# Patient Record
Sex: Female | Born: 1940
Health system: Southern US, Community
[De-identification: ages and names within clinical notes are randomized; demographics above are authoritative.]

## PROBLEM LIST (undated history)

## (undated) DIAGNOSIS — C801 Malignant (primary) neoplasm, unspecified: Secondary | ICD-10-CM

## (undated) DIAGNOSIS — R946 Abnormal results of thyroid function studies: Secondary | ICD-10-CM

## (undated) DIAGNOSIS — I10 Essential (primary) hypertension: Secondary | ICD-10-CM

## (undated) DIAGNOSIS — E785 Hyperlipidemia, unspecified: Secondary | ICD-10-CM

## (undated) DIAGNOSIS — E669 Obesity, unspecified: Secondary | ICD-10-CM

## (undated) DIAGNOSIS — F419 Anxiety disorder, unspecified: Secondary | ICD-10-CM

## (undated) DIAGNOSIS — L309 Dermatitis, unspecified: Secondary | ICD-10-CM

## (undated) DIAGNOSIS — I2510 Atherosclerotic heart disease of native coronary artery without angina pectoris: Secondary | ICD-10-CM

## (undated) DIAGNOSIS — Z853 Personal history of malignant neoplasm of breast: Secondary | ICD-10-CM

## (undated) DIAGNOSIS — E039 Hypothyroidism, unspecified: Secondary | ICD-10-CM

## (undated) DIAGNOSIS — M1990 Unspecified osteoarthritis, unspecified site: Secondary | ICD-10-CM

## (undated) DIAGNOSIS — H409 Unspecified glaucoma: Secondary | ICD-10-CM

## (undated) HISTORY — PX: MASTECTOMY: SHX3

## (undated) HISTORY — DX: Unspecified glaucoma: H40.9

## (undated) HISTORY — DX: Malignant (primary) neoplasm, unspecified: C80.1

## (undated) HISTORY — DX: Hyperlipidemia, unspecified: E78.5

## (undated) HISTORY — DX: Dermatitis, unspecified: L30.9

## (undated) HISTORY — DX: Unspecified osteoarthritis, unspecified site: M19.90

## (undated) HISTORY — PX: CRYOTHERAPY: SHX1416

## (undated) HISTORY — PX: EXCISION MORTON'S NEUROMA: SHX5013

## (undated) HISTORY — PX: REDUCTION MAMMAPLASTY: SUR839

## (undated) HISTORY — PX: DILATION AND CURETTAGE OF UTERUS: SHX78

## (undated) HISTORY — DX: Abnormal results of thyroid function studies: R94.6

## (undated) HISTORY — DX: Personal history of malignant neoplasm of breast: Z85.3

## (undated) HISTORY — DX: Essential (primary) hypertension: I10

## (undated) HISTORY — DX: Obesity, unspecified: E66.9

## (undated) HISTORY — PX: CARDIAC CATHETERIZATION: SHX172

## (undated) HISTORY — PX: COLONOSCOPY: SHX174

## (undated) HISTORY — DX: Anxiety disorder, unspecified: F41.9

## (undated) HISTORY — PX: AUGMENTATION MAMMAPLASTY: SUR837

---

## 2005-08-23 ENCOUNTER — Encounter: Admission: RE | Admit: 2005-08-23 | Discharge: 2005-08-23 | Payer: Self-pay | Admitting: Surgery

## 2005-08-30 ENCOUNTER — Encounter (INDEPENDENT_AMBULATORY_CARE_PROVIDER_SITE_OTHER): Payer: Self-pay | Admitting: *Deleted

## 2005-08-30 ENCOUNTER — Encounter (INDEPENDENT_AMBULATORY_CARE_PROVIDER_SITE_OTHER): Payer: Self-pay | Admitting: Diagnostic Radiology

## 2005-08-30 ENCOUNTER — Encounter: Admission: RE | Admit: 2005-08-30 | Discharge: 2005-08-30 | Payer: Self-pay | Admitting: Surgery

## 2005-10-11 HISTORY — PX: BREAST SURGERY: SHX581

## 2005-10-15 ENCOUNTER — Encounter: Admission: RE | Admit: 2005-10-15 | Discharge: 2005-10-15 | Payer: Self-pay | Admitting: Surgery

## 2005-10-19 ENCOUNTER — Encounter (INDEPENDENT_AMBULATORY_CARE_PROVIDER_SITE_OTHER): Payer: Self-pay | Admitting: Surgery

## 2005-10-19 ENCOUNTER — Observation Stay (HOSPITAL_COMMUNITY): Admission: AD | Admit: 2005-10-19 | Discharge: 2005-10-20 | Payer: Self-pay | Admitting: Surgery

## 2005-10-19 ENCOUNTER — Encounter: Payer: Self-pay | Admitting: Surgery

## 2005-10-19 ENCOUNTER — Encounter (INDEPENDENT_AMBULATORY_CARE_PROVIDER_SITE_OTHER): Payer: Self-pay | Admitting: *Deleted

## 2005-11-07 ENCOUNTER — Ambulatory Visit: Payer: Self-pay | Admitting: Oncology

## 2006-01-11 ENCOUNTER — Ambulatory Visit: Payer: Self-pay | Admitting: Oncology

## 2006-01-15 LAB — CBC WITH DIFFERENTIAL/PLATELET
BASO%: 0.6 % (ref 0.0–2.0)
Basophils Absolute: 0 10*3/uL (ref 0.0–0.1)
EOS%: 2.1 % (ref 0.0–7.0)
Eosinophils Absolute: 0.1 10*3/uL (ref 0.0–0.5)
HCT: 40.2 % (ref 34.8–46.6)
HGB: 13.3 g/dL (ref 11.6–15.9)
LYMPH%: 26.9 % (ref 14.0–48.0)
MCH: 29.1 pg (ref 26.0–34.0)
MCHC: 33.1 g/dL (ref 32.0–36.0)
MCV: 87.8 fL (ref 81.0–101.0)
MONO#: 0.6 10*3/uL (ref 0.1–0.9)
MONO%: 8.8 % (ref 0.0–13.0)
NEUT#: 4.2 10*3/uL (ref 1.5–6.5)
NEUT%: 61.6 % (ref 39.6–76.8)
Platelets: 310 10*3/uL (ref 145–400)
RBC: 4.57 10*6/uL (ref 3.70–5.32)
RDW: 13 % (ref 11.3–14.5)
WBC: 6.7 10*3/uL (ref 3.9–10.0)
lymph#: 1.8 10*3/uL (ref 0.9–3.3)

## 2006-01-15 LAB — COMPREHENSIVE METABOLIC PANEL
ALT: 25 U/L (ref 0–40)
AST: 21 U/L (ref 0–37)
Albumin: 4.6 g/dL (ref 3.5–5.2)
Alkaline Phosphatase: 69 U/L (ref 39–117)
BUN: 14 mg/dL (ref 6–23)
CO2: 27 mEq/L (ref 19–32)
Calcium: 9.4 mg/dL (ref 8.4–10.5)
Chloride: 106 mEq/L (ref 96–112)
Creatinine, Ser: 0.7 mg/dL (ref 0.4–1.2)
Glucose, Bld: 85 mg/dL (ref 70–99)
Potassium: 4.1 mEq/L (ref 3.5–5.3)
Sodium: 143 mEq/L (ref 135–145)
Total Bilirubin: 0.4 mg/dL (ref 0.3–1.2)
Total Protein: 7 g/dL (ref 6.0–8.3)

## 2006-01-15 LAB — CANCER ANTIGEN 27.29: CA 27.29: 26 U/mL (ref 0–39)

## 2006-01-15 LAB — LACTATE DEHYDROGENASE: LDH: 204 U/L (ref 94–250)

## 2006-04-12 ENCOUNTER — Ambulatory Visit: Payer: Self-pay | Admitting: Oncology

## 2006-04-16 LAB — CBC WITH DIFFERENTIAL/PLATELET
BASO%: 0.7 % (ref 0.0–2.0)
Basophils Absolute: 0 10*3/uL (ref 0.0–0.1)
EOS%: 3 % (ref 0.0–7.0)
Eosinophils Absolute: 0.2 10*3/uL (ref 0.0–0.5)
HCT: 39.2 % (ref 34.8–46.6)
HGB: 13.1 g/dL (ref 11.6–15.9)
LYMPH%: 29.5 % (ref 14.0–48.0)
MCH: 29 pg (ref 26.0–34.0)
MCHC: 33.4 g/dL (ref 32.0–36.0)
MCV: 86.9 fL (ref 81.0–101.0)
MONO#: 0.5 10*3/uL (ref 0.1–0.9)
MONO%: 9.4 % (ref 0.0–13.0)
NEUT#: 2.9 10*3/uL (ref 1.5–6.5)
NEUT%: 57.4 % (ref 39.6–76.8)
Platelets: 275 10*3/uL (ref 145–400)
RBC: 4.51 10*6/uL (ref 3.70–5.32)
RDW: 13.1 % (ref 11.3–14.5)
WBC: 5.1 10*3/uL (ref 3.9–10.0)
lymph#: 1.5 10*3/uL (ref 0.9–3.3)

## 2006-05-21 ENCOUNTER — Encounter: Admission: RE | Admit: 2006-05-21 | Discharge: 2006-05-21 | Payer: Self-pay | Admitting: Plastic Surgery

## 2006-07-12 ENCOUNTER — Ambulatory Visit: Payer: Self-pay | Admitting: Oncology

## 2006-07-16 LAB — CBC WITH DIFFERENTIAL/PLATELET
BASO%: 0.9 % (ref 0.0–2.0)
Basophils Absolute: 0.1 10*3/uL (ref 0.0–0.1)
EOS%: 0.7 % (ref 0.0–7.0)
Eosinophils Absolute: 0.1 10*3/uL (ref 0.0–0.5)
HCT: 40.3 % (ref 34.8–46.6)
HGB: 13.6 g/dL (ref 11.6–15.9)
LYMPH%: 18.5 % (ref 14.0–48.0)
MCH: 28.8 pg (ref 26.0–34.0)
MCHC: 33.7 g/dL (ref 32.0–36.0)
MCV: 85.6 fL (ref 81.0–101.0)
MONO#: 0.6 10*3/uL (ref 0.1–0.9)
MONO%: 6.5 % (ref 0.0–13.0)
NEUT#: 6.9 10*3/uL — ABNORMAL HIGH (ref 1.5–6.5)
NEUT%: 73.3 % (ref 39.6–76.8)
Platelets: 293 10*3/uL (ref 145–400)
RBC: 4.71 10*6/uL (ref 3.70–5.32)
RDW: 11.9 % (ref 11.3–14.5)
WBC: 9.4 10*3/uL (ref 3.9–10.0)
lymph#: 1.7 10*3/uL (ref 0.9–3.3)

## 2006-07-30 LAB — COMPREHENSIVE METABOLIC PANEL
ALT: 24 U/L (ref 0–35)
AST: 19 U/L (ref 0–37)
Albumin: 4.6 g/dL (ref 3.5–5.2)
Alkaline Phosphatase: 76 U/L (ref 39–117)
BUN: 13 mg/dL (ref 6–23)
CO2: 28 mEq/L (ref 19–32)
Calcium: 8.9 mg/dL (ref 8.4–10.5)
Chloride: 104 mEq/L (ref 96–112)
Creatinine, Ser: 0.71 mg/dL (ref 0.40–1.20)
Glucose, Bld: 129 mg/dL — ABNORMAL HIGH (ref 70–99)
Potassium: 3.6 mEq/L (ref 3.5–5.3)
Sodium: 139 mEq/L (ref 135–145)
Total Bilirubin: 0.7 mg/dL (ref 0.3–1.2)
Total Protein: 7 g/dL (ref 6.0–8.3)

## 2006-07-30 LAB — ESTRADIOL, ULTRA SENS: Estradiol, Ultra Sensitive: 28 pg/mL

## 2006-07-30 LAB — CANCER ANTIGEN 27.29: CA 27.29: 28 U/mL (ref 0–39)

## 2006-10-16 ENCOUNTER — Ambulatory Visit: Payer: Self-pay | Admitting: Oncology

## 2006-10-21 LAB — CBC WITH DIFFERENTIAL/PLATELET
BASO%: 0.4 % (ref 0.0–2.0)
Basophils Absolute: 0 10*3/uL (ref 0.0–0.1)
EOS%: 1.9 % (ref 0.0–7.0)
Eosinophils Absolute: 0.1 10*3/uL (ref 0.0–0.5)
HCT: 40 % (ref 34.8–46.6)
HGB: 13.7 g/dL (ref 11.6–15.9)
LYMPH%: 13.5 % — ABNORMAL LOW (ref 14.0–48.0)
MCH: 29.4 pg (ref 26.0–34.0)
MCHC: 34.2 g/dL (ref 32.0–36.0)
MCV: 85.9 fL (ref 81.0–101.0)
MONO#: 0.6 10*3/uL (ref 0.1–0.9)
MONO%: 9.2 % (ref 0.0–13.0)
NEUT#: 5.3 10*3/uL (ref 1.5–6.5)
NEUT%: 75 % (ref 39.6–76.8)
Platelets: 243 10*3/uL (ref 145–400)
RBC: 4.65 10*6/uL (ref 3.70–5.32)
RDW: 12.8 % (ref 11.3–14.5)
WBC: 7 10*3/uL (ref 3.9–10.0)
lymph#: 0.9 10*3/uL (ref 0.9–3.3)

## 2006-10-22 ENCOUNTER — Ambulatory Visit (HOSPITAL_BASED_OUTPATIENT_CLINIC_OR_DEPARTMENT_OTHER): Admission: RE | Admit: 2006-10-22 | Discharge: 2006-10-22 | Payer: Self-pay | Admitting: Plastic Surgery

## 2006-10-22 ENCOUNTER — Encounter (INDEPENDENT_AMBULATORY_CARE_PROVIDER_SITE_OTHER): Payer: Self-pay | Admitting: Specialist

## 2006-10-22 LAB — COMPREHENSIVE METABOLIC PANEL
ALT: 14 U/L (ref 0–35)
AST: 14 U/L (ref 0–37)
Albumin: 4.5 g/dL (ref 3.5–5.2)
Alkaline Phosphatase: 81 U/L (ref 39–117)
BUN: 16 mg/dL (ref 6–23)
CO2: 25 mEq/L (ref 19–32)
Calcium: 9.1 mg/dL (ref 8.4–10.5)
Chloride: 108 mEq/L (ref 96–112)
Creatinine, Ser: 0.77 mg/dL (ref 0.40–1.20)
Glucose, Bld: 90 mg/dL (ref 70–99)
Potassium: 4.3 mEq/L (ref 3.5–5.3)
Sodium: 142 mEq/L (ref 135–145)
Total Bilirubin: 0.5 mg/dL (ref 0.3–1.2)
Total Protein: 6.8 g/dL (ref 6.0–8.3)

## 2006-10-22 LAB — CANCER ANTIGEN 27.29: CA 27.29: 28 U/mL (ref 0–39)

## 2007-05-08 ENCOUNTER — Ambulatory Visit: Payer: Self-pay | Admitting: Oncology

## 2007-05-13 LAB — CBC WITH DIFFERENTIAL/PLATELET
BASO%: 2.6 % — ABNORMAL HIGH (ref 0.0–2.0)
Basophils Absolute: 0.2 10*3/uL — ABNORMAL HIGH (ref 0.0–0.1)
EOS%: 0.3 % (ref 0.0–7.0)
Eosinophils Absolute: 0 10*3/uL (ref 0.0–0.5)
HCT: 40.7 % (ref 34.8–46.6)
HGB: 14.2 g/dL (ref 11.6–15.9)
LYMPH%: 22.7 % (ref 14.0–48.0)
MCH: 30.3 pg (ref 26.0–34.0)
MCHC: 34.9 g/dL (ref 32.0–36.0)
MCV: 86.8 fL (ref 81.0–101.0)
MONO#: 0.7 10*3/uL (ref 0.1–0.9)
MONO%: 8 % (ref 0.0–13.0)
NEUT#: 6 10*3/uL (ref 1.5–6.5)
NEUT%: 66.4 % (ref 39.6–76.8)
Platelets: 295 10*3/uL (ref 145–400)
RBC: 4.69 10*6/uL (ref 3.70–5.32)
RDW: 12.3 % (ref 11.3–14.5)
WBC: 9 10*3/uL (ref 3.9–10.0)
lymph#: 2 10*3/uL (ref 0.9–3.3)

## 2007-05-13 LAB — COMPREHENSIVE METABOLIC PANEL
ALT: 17 U/L (ref 0–35)
AST: 14 U/L (ref 0–37)
Albumin: 4.7 g/dL (ref 3.5–5.2)
Alkaline Phosphatase: 78 U/L (ref 39–117)
BUN: 18 mg/dL (ref 6–23)
CO2: 24 mEq/L (ref 19–32)
Calcium: 10 mg/dL (ref 8.4–10.5)
Chloride: 104 mEq/L (ref 96–112)
Creatinine, Ser: 0.73 mg/dL (ref 0.40–1.20)
Glucose, Bld: 86 mg/dL (ref 70–99)
Potassium: 4.4 mEq/L (ref 3.5–5.3)
Sodium: 142 mEq/L (ref 135–145)
Total Bilirubin: 0.7 mg/dL (ref 0.3–1.2)
Total Protein: 7.3 g/dL (ref 6.0–8.3)

## 2007-05-13 LAB — CANCER ANTIGEN 27.29: CA 27.29: 31 U/mL (ref 0–39)

## 2007-05-21 LAB — ESTRADIOL, ULTRA SENS: Estradiol, Ultra Sensitive: <2 pg/mL

## 2007-06-02 ENCOUNTER — Encounter: Admission: RE | Admit: 2007-06-02 | Discharge: 2007-06-02 | Payer: Self-pay | Admitting: Surgery

## 2007-07-14 ENCOUNTER — Ambulatory Visit: Payer: Self-pay | Admitting: Family Medicine

## 2007-09-15 ENCOUNTER — Ambulatory Visit: Payer: Self-pay | Admitting: Family Medicine

## 2007-11-06 ENCOUNTER — Ambulatory Visit: Payer: Self-pay | Admitting: Oncology

## 2007-11-13 LAB — CBC WITH DIFFERENTIAL/PLATELET
BASO%: 0.4 % (ref 0.0–2.0)
Basophils Absolute: 0 10*3/uL (ref 0.0–0.1)
EOS%: 0.9 % (ref 0.0–7.0)
Eosinophils Absolute: 0.1 10*3/uL (ref 0.0–0.5)
HCT: 39 % (ref 34.8–46.6)
HGB: 13.3 g/dL (ref 11.6–15.9)
LYMPH%: 32.5 % (ref 14.0–48.0)
MCH: 29.5 pg (ref 26.0–34.0)
MCHC: 34.2 g/dL (ref 32.0–36.0)
MCV: 86.1 fL (ref 81.0–101.0)
MONO#: 0.5 10*3/uL (ref 0.1–0.9)
MONO%: 8 % (ref 0.0–13.0)
NEUT#: 3.4 10*3/uL (ref 1.5–6.5)
NEUT%: 58.2 % (ref 39.6–76.8)
Platelets: 227 10*3/uL (ref 145–400)
RBC: 4.53 10*6/uL (ref 3.70–5.32)
RDW: 12.6 % (ref 11.3–14.5)
WBC: 5.8 10*3/uL (ref 3.9–10.0)
lymph#: 1.9 10*3/uL (ref 0.9–3.3)

## 2007-11-14 LAB — COMPREHENSIVE METABOLIC PANEL
ALT: 14 U/L (ref 0–35)
AST: 14 U/L (ref 0–37)
Albumin: 4.4 g/dL (ref 3.5–5.2)
Alkaline Phosphatase: 64 U/L (ref 39–117)
BUN: 15 mg/dL (ref 6–23)
CO2: 25 mEq/L (ref 19–32)
Calcium: 9.1 mg/dL (ref 8.4–10.5)
Chloride: 106 mEq/L (ref 96–112)
Creatinine, Ser: 0.64 mg/dL (ref 0.40–1.20)
Glucose, Bld: 95 mg/dL (ref 70–99)
Potassium: 4.1 mEq/L (ref 3.5–5.3)
Sodium: 143 mEq/L (ref 135–145)
Total Bilirubin: 0.6 mg/dL (ref 0.3–1.2)
Total Protein: 6.7 g/dL (ref 6.0–8.3)

## 2007-11-14 LAB — VITAMIN D 25 HYDROXY (VIT D DEFICIENCY, FRACTURES): Vit D, 25-Hydroxy: 22 ng/mL — ABNORMAL LOW (ref 30–89)

## 2007-11-14 LAB — CANCER ANTIGEN 27.29: CA 27.29: 26 U/mL (ref 0–39)

## 2007-11-20 LAB — VITAMIN D 1,25 DIHYDROXY: Vit D, 1,25-Dihydroxy: 55 pg/mL (ref 6–62)

## 2008-06-11 ENCOUNTER — Ambulatory Visit: Payer: Self-pay | Admitting: Oncology

## 2008-06-14 ENCOUNTER — Encounter: Admission: RE | Admit: 2008-06-14 | Discharge: 2008-06-14 | Payer: Self-pay | Admitting: Surgery

## 2008-06-15 LAB — CBC WITH DIFFERENTIAL/PLATELET
BASO%: 1.8 % (ref 0.0–2.0)
Basophils Absolute: 0.1 10*3/uL (ref 0.0–0.1)
EOS%: 0.8 % (ref 0.0–7.0)
Eosinophils Absolute: 0.1 10*3/uL (ref 0.0–0.5)
HCT: 41.2 % (ref 34.8–46.6)
HGB: 14 g/dL (ref 11.6–15.9)
LYMPH%: 25 % (ref 14.0–48.0)
MCH: 29.5 pg (ref 26.0–34.0)
MCHC: 33.9 g/dL (ref 32.0–36.0)
MCV: 87.1 fL (ref 81.0–101.0)
MONO#: 0.6 10*3/uL (ref 0.1–0.9)
MONO%: 7.7 % (ref 0.0–13.0)
NEUT#: 5 10*3/uL (ref 1.5–6.5)
NEUT%: 64.7 % (ref 39.6–76.8)
Platelets: 295 10*3/uL (ref 145–400)
RBC: 4.73 10*6/uL (ref 3.70–5.32)
RDW: 11.8 % (ref 11.3–14.5)
WBC: 7.7 10*3/uL (ref 3.9–10.0)
lymph#: 1.9 10*3/uL (ref 0.9–3.3)

## 2008-06-16 LAB — COMPREHENSIVE METABOLIC PANEL
ALT: 26 U/L (ref 0–35)
AST: 17 U/L (ref 0–37)
Albumin: 4.8 g/dL (ref 3.5–5.2)
Alkaline Phosphatase: 75 U/L (ref 39–117)
BUN: 17 mg/dL (ref 6–23)
CO2: 27 mEq/L (ref 19–32)
Calcium: 9.7 mg/dL (ref 8.4–10.5)
Chloride: 105 mEq/L (ref 96–112)
Creatinine, Ser: 0.77 mg/dL (ref 0.40–1.20)
Glucose, Bld: 104 mg/dL — ABNORMAL HIGH (ref 70–99)
Potassium: 4 mEq/L (ref 3.5–5.3)
Sodium: 142 mEq/L (ref 135–145)
Total Bilirubin: 0.6 mg/dL (ref 0.3–1.2)
Total Protein: 7.3 g/dL (ref 6.0–8.3)

## 2008-06-16 LAB — CANCER ANTIGEN 27.29: CA 27.29: 37 U/mL (ref 0–39)

## 2008-10-18 ENCOUNTER — Ambulatory Visit: Payer: Self-pay | Admitting: Family Medicine

## 2008-12-14 ENCOUNTER — Ambulatory Visit: Payer: Self-pay | Admitting: Oncology

## 2008-12-16 LAB — CBC WITH DIFFERENTIAL/PLATELET
BASO%: 0.9 % (ref 0.0–2.0)
Basophils Absolute: 0.1 10*3/uL (ref 0.0–0.1)
EOS%: 2 % (ref 0.0–7.0)
Eosinophils Absolute: 0.1 10*3/uL (ref 0.0–0.5)
HCT: 41 % (ref 34.8–46.6)
HGB: 13.6 g/dL (ref 11.6–15.9)
LYMPH%: 27.9 % (ref 14.0–49.7)
MCH: 29.2 pg (ref 25.1–34.0)
MCHC: 33.2 g/dL (ref 31.5–36.0)
MCV: 88 fL (ref 79.5–101.0)
MONO#: 0.5 10*3/uL (ref 0.1–0.9)
MONO%: 10.1 % (ref 0.0–14.0)
NEUT#: 3.2 10*3/uL (ref 1.5–6.5)
NEUT%: 59.1 % (ref 38.4–76.8)
Platelets: ADEQUATE 10*3/uL (ref 145–400)
RBC: 4.66 10*6/uL (ref 3.70–5.45)
RDW: 12.7 % (ref 11.2–14.5)
WBC: 5.4 10*3/uL (ref 3.9–10.3)
lymph#: 1.5 10*3/uL (ref 0.9–3.3)
nRBC: 0 % (ref 0–0)

## 2008-12-17 LAB — COMPREHENSIVE METABOLIC PANEL
ALT: 26 U/L (ref 0–35)
AST: 21 U/L (ref 0–37)
Albumin: 4.6 g/dL (ref 3.5–5.2)
Alkaline Phosphatase: 69 U/L (ref 39–117)
BUN: 10 mg/dL (ref 6–23)
CO2: 27 mEq/L (ref 19–32)
Calcium: 9.8 mg/dL (ref 8.4–10.5)
Chloride: 107 mEq/L (ref 96–112)
Creatinine, Ser: 0.72 mg/dL (ref 0.40–1.20)
Glucose, Bld: 83 mg/dL (ref 70–99)
Potassium: 4.1 mEq/L (ref 3.5–5.3)
Sodium: 143 mEq/L (ref 135–145)
Total Bilirubin: 0.5 mg/dL (ref 0.3–1.2)
Total Protein: 6.7 g/dL (ref 6.0–8.3)

## 2008-12-17 LAB — CANCER ANTIGEN 27.29: CA 27.29: 36 U/mL (ref 0–39)

## 2008-12-17 LAB — VITAMIN D 25 HYDROXY (VIT D DEFICIENCY, FRACTURES): Vit D, 25-Hydroxy: 13 ng/mL — ABNORMAL LOW (ref 30–89)

## 2009-06-15 ENCOUNTER — Ambulatory Visit: Payer: Self-pay | Admitting: Family Medicine

## 2009-06-15 ENCOUNTER — Encounter: Admission: RE | Admit: 2009-06-15 | Discharge: 2009-06-15 | Payer: Self-pay | Admitting: Surgery

## 2009-06-15 LAB — HM MAMMOGRAPHY: HM Mammogram: NORMAL

## 2009-07-13 ENCOUNTER — Ambulatory Visit: Payer: Self-pay | Admitting: Family Medicine

## 2009-12-23 ENCOUNTER — Ambulatory Visit: Payer: Self-pay | Admitting: Oncology

## 2009-12-27 LAB — CBC WITH DIFFERENTIAL/PLATELET
BASO%: 0.8 % (ref 0.0–2.0)
Basophils Absolute: 0 10*3/uL (ref 0.0–0.1)
EOS%: 1.1 % (ref 0.0–7.0)
Eosinophils Absolute: 0.1 10*3/uL (ref 0.0–0.5)
HCT: 40.8 % (ref 34.8–46.6)
HGB: 13.6 g/dL (ref 11.6–15.9)
LYMPH%: 33.4 % (ref 14.0–49.7)
MCH: 30.1 pg (ref 25.1–34.0)
MCHC: 33.3 g/dL (ref 31.5–36.0)
MCV: 90.2 fL (ref 79.5–101.0)
MONO#: 0.6 10*3/uL (ref 0.1–0.9)
MONO%: 9.2 % (ref 0.0–14.0)
NEUT#: 3.6 10*3/uL (ref 1.5–6.5)
NEUT%: 55.5 % (ref 38.4–76.8)
Platelets: 258 10*3/uL (ref 145–400)
RBC: 4.52 10*6/uL (ref 3.70–5.45)
RDW: 12.7 % (ref 11.2–14.5)
WBC: 6.5 10*3/uL (ref 3.9–10.3)
lymph#: 2.2 10*3/uL (ref 0.9–3.3)

## 2009-12-28 LAB — COMPREHENSIVE METABOLIC PANEL
ALT: 23 U/L (ref 0–35)
AST: 18 U/L (ref 0–37)
Albumin: 4.6 g/dL (ref 3.5–5.2)
Alkaline Phosphatase: 77 U/L (ref 39–117)
BUN: 14 mg/dL (ref 6–23)
CO2: 24 mEq/L (ref 19–32)
Calcium: 8.9 mg/dL (ref 8.4–10.5)
Chloride: 105 mEq/L (ref 96–112)
Creatinine, Ser: 0.75 mg/dL (ref 0.40–1.20)
Glucose, Bld: 74 mg/dL (ref 70–99)
Potassium: 4.2 mEq/L (ref 3.5–5.3)
Sodium: 141 mEq/L (ref 135–145)
Total Bilirubin: 0.5 mg/dL (ref 0.3–1.2)
Total Protein: 6.8 g/dL (ref 6.0–8.3)

## 2009-12-28 LAB — VITAMIN D 25 HYDROXY (VIT D DEFICIENCY, FRACTURES): Vit D, 25-Hydroxy: 16 ng/mL — ABNORMAL LOW (ref 30–89)

## 2009-12-28 LAB — CANCER ANTIGEN 27.29: CA 27.29: 28 U/mL (ref 0–39)

## 2010-06-09 LAB — HM COLONOSCOPY: HM Colonoscopy: NORMAL

## 2010-06-19 ENCOUNTER — Encounter: Admission: RE | Admit: 2010-06-19 | Discharge: 2010-06-19 | Payer: Self-pay | Admitting: Surgery

## 2010-07-17 ENCOUNTER — Ambulatory Visit: Payer: Self-pay | Admitting: Family Medicine

## 2010-10-01 ENCOUNTER — Encounter: Payer: Self-pay | Admitting: Plastic Surgery

## 2010-12-28 ENCOUNTER — Other Ambulatory Visit: Payer: Self-pay | Admitting: Oncology

## 2010-12-28 ENCOUNTER — Encounter (HOSPITAL_BASED_OUTPATIENT_CLINIC_OR_DEPARTMENT_OTHER): Payer: Medicare Other | Admitting: Oncology

## 2010-12-28 DIAGNOSIS — M899 Disorder of bone, unspecified: Secondary | ICD-10-CM

## 2010-12-28 DIAGNOSIS — M949 Disorder of cartilage, unspecified: Secondary | ICD-10-CM

## 2010-12-28 DIAGNOSIS — Z170 Estrogen receptor positive status [ER+]: Secondary | ICD-10-CM

## 2010-12-28 DIAGNOSIS — C50919 Malignant neoplasm of unspecified site of unspecified female breast: Secondary | ICD-10-CM

## 2010-12-28 LAB — CBC WITH DIFFERENTIAL/PLATELET
BASO%: 1.1 % (ref 0.0–2.0)
Basophils Absolute: 0.1 10*3/uL (ref 0.0–0.1)
EOS%: 1.3 % (ref 0.0–7.0)
Eosinophils Absolute: 0.1 10*3/uL (ref 0.0–0.5)
HCT: 41.7 % (ref 34.8–46.6)
HGB: 13.2 g/dL (ref 11.6–15.9)
LYMPH%: 34 % (ref 14.0–49.7)
MCH: 28 pg (ref 25.1–34.0)
MCHC: 31.7 g/dL (ref 31.5–36.0)
MCV: 88.5 fL (ref 79.5–101.0)
MONO#: 0.5 10*3/uL (ref 0.1–0.9)
MONO%: 8.7 % (ref 0.0–14.0)
NEUT#: 2.9 10*3/uL (ref 1.5–6.5)
NEUT%: 54.9 % (ref 38.4–76.8)
Platelets: 251 10*3/uL (ref 145–400)
RBC: 4.71 10*6/uL (ref 3.70–5.45)
RDW: 12.6 % (ref 11.2–14.5)
WBC: 5.3 10*3/uL (ref 3.9–10.3)
lymph#: 1.8 10*3/uL (ref 0.9–3.3)

## 2010-12-28 LAB — COMPREHENSIVE METABOLIC PANEL
ALT: 17 U/L (ref 0–35)
AST: 17 U/L (ref 0–37)
Albumin: 4.7 g/dL (ref 3.5–5.2)
Alkaline Phosphatase: 72 U/L (ref 39–117)
BUN: 14 mg/dL (ref 6–23)
CO2: 26 mEq/L (ref 19–32)
Calcium: 9.6 mg/dL (ref 8.4–10.5)
Chloride: 104 mEq/L (ref 96–112)
Creatinine, Ser: 0.82 mg/dL (ref 0.40–1.20)
Glucose, Bld: 94 mg/dL (ref 70–99)
Potassium: 4 mEq/L (ref 3.5–5.3)
Sodium: 144 mEq/L (ref 135–145)
Total Bilirubin: 0.5 mg/dL (ref 0.3–1.2)
Total Protein: 6.7 g/dL (ref 6.0–8.3)

## 2010-12-28 LAB — CANCER ANTIGEN 27.29: CA 27.29: 23 U/mL (ref 0–39)

## 2011-01-15 ENCOUNTER — Encounter (HOSPITAL_BASED_OUTPATIENT_CLINIC_OR_DEPARTMENT_OTHER): Payer: Medicare Other | Admitting: Oncology

## 2011-01-15 DIAGNOSIS — Z170 Estrogen receptor positive status [ER+]: Secondary | ICD-10-CM

## 2011-01-15 DIAGNOSIS — M949 Disorder of cartilage, unspecified: Secondary | ICD-10-CM

## 2011-01-15 DIAGNOSIS — C50919 Malignant neoplasm of unspecified site of unspecified female breast: Secondary | ICD-10-CM

## 2011-01-15 DIAGNOSIS — M899 Disorder of bone, unspecified: Secondary | ICD-10-CM

## 2011-01-25 ENCOUNTER — Encounter (INDEPENDENT_AMBULATORY_CARE_PROVIDER_SITE_OTHER): Payer: Self-pay | Admitting: Surgery

## 2011-01-26 NOTE — Op Note (Signed)
[name], [name]               ACCOUNT NO.:  [id number]   MEDICAL RECORD NO.:  [id number]          PATIENT TYPE:  AMB   LOCATION:  DSC                          FACILITY:  [facility]   PHYSICIAN:  Etter Sjogren, M.D.     DATE OF BIRTH:  1941-04-28   DATE OF PROCEDURE:  10/22/2006  DATE OF DISCHARGE:                               OPERATIVE REPORT   PREOPERATIVE DIAGNOSES:  1. Left breast cancer status post reconstruction with excess tissue      and residual deformity.  2. Absence of the left nipple.   POSTOPERATIVE DIAGNOSES:  1. Left breast cancer status post reconstruction with excess tissue      and residual deformity.  2. Absence of the left nipple.   PROCEDURE:  1. Revision of the left breast reconstruction.  2. Distinct procedure, a left nipple reconstruction.   ANESTHESIA:  MAC sedation.   DRAINS:  One Harrison Mons was left.   CLINICAL NOTE:  A 70 year old woman who has had breast cancer, has had  reconstruction of tissues expanded followed by placement of implant.  She has some excess tissue that is uncomfortable and irritated out  laterally and also a deformity medially and the absence of a nipple.  She desires revision of her breast reconstruction and a nipple  reconstruction.   The procedures and risks and possible complications were discussed with  her in detail and she understood those risks and wished to proceed.   DESCRIPTION OF PROCEDURE:  The patient was placed in a full standing  position in the holding area and marked for the tacking procedure  medially over the sternum and the excision of the tissue, left lateral  chest extending off of the reconstruction and the nipple reconstruction.  She viewed the sites of the planned nipple reconstruction in the mirror  and agreed with this placement.  She was taken to the operating room and  placed supine.   After successful induction of general anesthesia, prepped and draped  with sterile drapes.  The 0.25% Marcaine  with 1:400,000 epinephrine was  then infiltrated medially just over the very medial aspect of the old  mastectomy scar.  The dissection was then carried medial away from the  reconstruction and some of the excess subcutaneous tissue was removed in  this area.  The pocket was created overlying the sternum.  Thorough  irrigation with saline.  Good hemostasis was achieved using  electrocautery and the 0 PDS suture was then used as a dermal tacking  suture down to the sternum.  The remainder of the closure with 3-0  Monocryl interrupted, inverted deep dermal sutures and 3-0 Monocryl  running subcuticular suture.   Attention directed to the left lateral chest, where the tissue was  anesthetized satisfactorily using the Marcaine with epinephrine as  before in the medial area.  The excision was performed.  Care was taken  to avoid any damage to underlying muscle and meticulous with  electrocautery.  Thorough irrigation with saline.  Excellent hemostasis  was confirmed.  A Blake drain was positioned and brought out through a  separate stab wound inferolaterally  and secured with a 3-0 Prolene  suture.  The closure was 2-0 PDS interrupted inverted deep sutures and 3-  0 Monocryl interrupted inverted deep dermal sutures and 3-0 Monocryl  running subcuticular suture.   Attention directed to the nipple reconstruction.  The design was placed  forwardly, inferiorly based and the anesthesia locally with 1% Xylocaine  plain.  The flap was cut.  The care was taken in the dissection to avoid  damage to underlying muscle and to avoid any exposure to the underlying  implant.  The wound was irrigated thoroughly, excellent hemostasis was  achieved using electrocautery.  The flap was inspected.  Bright red  bleeding around the  periphery consistent with viability.  The donor  defect closed with 3-0 Monocryl interrupted inverted deep sutures, 4-0  chromic simple interrupted sutures.  The flap was then inset in  folding  the limbs on itself using 4-0 chromic simple interrupted sutures.  The  underlying base was then deepithelized and the flap was then set on that  base after irrigating with saline.  Insetting the flap with 3-0 Monocryl  simple interrupted sutures behind the periphery of the base.  The flap  was checked.  The nipple reconstruction did not have any tension on it  and was quite supple and viable with excellent color.  Antibiotic  ointment, Xeroform gauze, eye pads with a hold cut in the center, and 4  x 4s and fluff were placed and secured lightly with tape.  A pressure  dressing placed over the sternal region and ABDs applied and she was  wrapped with an Ace wrap lightly.  Tolerated it well.  Transferred to  the recovery room in stable condition.   DISPOSITION:  We will recheck in the office next week.      Etter Sjogren, M.D.  Electronically Signed     DB/MEDQ  D:  10/22/2006  T:  10/23/2006  Job:  [id number]

## 2011-01-26 NOTE — Op Note (Signed)
[name], [name]               ACCOUNT NO.:  [id number]   MEDICAL RECORD NO.:  [id number]          PATIENT TYPE:  AMB   LOCATION:  DSC                          FACILITY:  [facility]   PHYSICIAN:  Currie Paris, M.D.DATE OF BIRTH:  Aug 22, 1941   DATE OF PROCEDURE:  10/19/2005  DATE OF DISCHARGE:                                 OPERATIVE REPORT   CCS [id number].   PREOP DIAGNOSIS:  DCIS left breast, multifocal.   POSTOP:  DCIS left breast, multifocal.   OPERATION:  Left total mastectomy with blue dye injection and axillary  sentinel lymph node biopsy (two nodes).   SURGEON:  Dr. Jamey Ripa   ASSISTANT:  Dr. Abbey Chatters.   ANESTHESIA:  General endotracheal.   CLINICAL HISTORY:  This is a 64-year lady who was recently found have a  focus of DCIS in her left breast.  However, on further review she appeared  to have multiple areas of calcifications and others were biopsied and it was  thought that she had mark diffuse DCIS instead of a localized process. After  that was found, she elected to proceed to mastectomy with a implant  reconstruction by Dr. Odis Luster.   DESCRIPTION OF PROCEDURE:  The patient was seen in the holding area and she  had no further questions. Her left breast had already been injected with  radioactive isotope and she had already initialed it as the operative side.  I likewise confirmed that the left side was the operative side and initialed  it.   The patient was taken to the operating room and after satisfactory general  anesthesia had been obtained the periareolar area was prepped and the time-  out occurred. I then injected 5 mL of methylene blue dye diluted into the  subareolar area.   Full prep and drape of both breasts was carried out. We already had marked  the inframammary fold. I outlined an elliptical incision for the mastectomy  and made the superior and inferior incisions. I then raised a superior flap  medially to the sternum, superiorly to the  clavicle and  laterally into the  axilla where I had already marked the skin overlying the hot area. With some  dissection in the axillary fat I found a blue lymphatic leading to a blue  lymph node which was removed with an adjacent second node. Once these were  out, there were no significant counts and no palpable adenopathy noticed.  Counts on the main blue lymph node were about 320 and in checking the  axilla, we got no counts above about 15. A small pack was placed and  attention turned to the inferior flap which was then made. Went out to the  latissimus down to the inframammary fold. The breast was then removed from  medial to lateral. As I was making the inferior flap, there was a little  area of perhaps less than a millimeter of some nodularity just subcu and I  sent that as a frozen in case we had a close margin with some DCIS out into  the fatty tissue.   A mastectomy was completed  and we made sure everything was dry. The sentinel  node returned negative. However, the tiny area that I had biopsied did turn  out to be a tiny focus of DCIS. I therefore went back and re-excised the  skin at this area so that we had a complete full-thickness margin over this  area where there had been a close focus of DCIS.   I closely inspected the flaps and saw no other areas and we had been fairly  thin flaps all around.   The patient tolerated procedure well. There no operative complications. All  counts were correct at my point and Dr. Odis Luster came in to do a  reconstruction.      Currie Paris, M.D.  Electronically Signed     CJS/MEDQ  D:  10/19/2005  T:  10/19/2005  Job:  [id number]

## 2011-01-26 NOTE — Op Note (Signed)
[name], [name]               ACCOUNT NO.:  [id number]   MEDICAL RECORD NO.:  [id number]          PATIENT TYPE:  AMB   LOCATION:  DSC                          FACILITY:  [facility]   PHYSICIAN:  Etter Sjogren, M.D.     DATE OF BIRTH:  23-Oct-1940   DATE OF PROCEDURE:  10/19/2005  DATE OF DISCHARGE:                                 OPERATIVE REPORT   PREOPERATIVE DIAGNOSIS:  Left breast cancer.   POSTOPERATIVE DIAGNOSIS:  Left breast cancer.   PROCEDURE PERFORMED:  Left breast reconstruction with placement of tissue  expander.   SURGEON:  Etter Sjogren, M.D.   ANESTHESIA:  General.   ESTIMATED BLOOD LOSS:  Minimal.   DRAINS:  There were 2 Blake's left.   CLINICAL NOTE:  A 70 year old woman who has left breast cancer. Will be  having a mastectomy today and is undergoing reconstruction. The nature of  the procedure and risks and possible complications were discussed with her  as well as the various options. She decided to have placement of tissue  expander followed interventionally by a staged procedure for placement of an  implant. The nature of this procedure and the risks were discussed with her  in great detail and she understood those risks and possible complications  and wished to proceed.   DESCRIPTION OF PROCEDURE:  The mastectomy had been completed by general  surgery, Dr. Jamey Ripa, and she had done very well. The dissection was carried  deep to the pectoralis major muscle using a muscle-splitting incision and a  submuscular pocket was developed taking great care to avoid injury to the  underlying chest cavity. Meticulous hemostasis obtained throughout with  electrocautery. Submuscular pocket was thus developed. Saline was used for  copious irrigation and meticulous hemostasis was achieved using  electrocautery. The skin edges were trimmed, inferior flap and superior flap  for match and also to insure viability. Bright red bleeding along the entire  periphery was  consistent with viability of the skin flaps. Brought into  position, brought through separate stab wounds inferolaterally and secured  with 2-0 Prolene sutures. One of these placed in the left lateral aspect and  superiorly and the other inferiorly and medially. After thoroughly cleaning  the gloves, the tissue expander was soaked in antibiotic solution for  greater than 5 minutes. This was a McGhan style 133FC-13, 500 cc, lot  #[id number]. 100 cc of sterile saline placed and the air removed. The tissue  expander was then placed in the submuscular pocket, again first insuring  that excellent hemostasis was present. Care was taken to make sure that the  expander was located all of the way in the inferior aspect of the  submuscular pocket. Care was taken to make sure that there were no folds or  wrinkles and that the tissue expander was lying flat. The closure of the  muscle with 3-0 Vicryl interrupted figure-of-eight sutures taking great care  to avoid damage to the underlying expander. Again, the mastectomy site was  irrigated thoroughly with saline at this point and excellent hemostasis was  confirmed. The closure with 3-0 Monocryl  interrupted for the deep sutures, 4-0 Monocryl running subcuticular suture.  Steri-Strips, dry sterile dressing, circumferential pressure was applied and  transferred to the recovery room in stable condition having tolerated the  procedure well.      Etter Sjogren, M.D.  Electronically Signed     DB/MEDQ  D:  10/19/2005  T:  10/19/2005  Job:  [id number]   cc:   Etter Sjogren, M.D.  Fax: [phone]

## 2011-05-28 ENCOUNTER — Other Ambulatory Visit (INDEPENDENT_AMBULATORY_CARE_PROVIDER_SITE_OTHER): Payer: Self-pay | Admitting: Surgery

## 2011-05-28 DIAGNOSIS — Z1231 Encounter for screening mammogram for malignant neoplasm of breast: Secondary | ICD-10-CM

## 2011-06-19 DIAGNOSIS — Z0279 Encounter for issue of other medical certificate: Secondary | ICD-10-CM

## 2011-06-27 ENCOUNTER — Ambulatory Visit
Admission: RE | Admit: 2011-06-27 | Discharge: 2011-06-27 | Disposition: A | Discharge status: Home / Self Care | Payer: Medicare Other | Source: Ambulatory Visit | Attending: Surgery | Admitting: Surgery

## 2011-06-27 DIAGNOSIS — Z1231 Encounter for screening mammogram for malignant neoplasm of breast: Secondary | ICD-10-CM

## 2011-07-10 ENCOUNTER — Encounter (INDEPENDENT_AMBULATORY_CARE_PROVIDER_SITE_OTHER): Payer: Self-pay | Admitting: General Surgery

## 2011-07-10 DIAGNOSIS — Z853 Personal history of malignant neoplasm of breast: Secondary | ICD-10-CM | POA: Insufficient documentation

## 2011-07-10 HISTORY — DX: Personal history of malignant neoplasm of breast: Z85.3

## 2011-07-12 ENCOUNTER — Encounter (INDEPENDENT_AMBULATORY_CARE_PROVIDER_SITE_OTHER): Payer: Self-pay | Admitting: Surgery

## 2011-07-12 ENCOUNTER — Ambulatory Visit (INDEPENDENT_AMBULATORY_CARE_PROVIDER_SITE_OTHER): Payer: Medicare Other | Admitting: Surgery

## 2011-07-12 VITALS — BP 170/92 | HR 80 | Temp 98.2°F | Resp 18 | Ht 63.0 in | Wt 158.8 lb

## 2011-07-12 DIAGNOSIS — Z853 Personal history of malignant neoplasm of breast: Secondary | ICD-10-CM

## 2011-07-12 NOTE — Progress Notes (Signed)
NAME: Wilma C Toste       DOB: [birth year]           DATE: 07/12/2011       MRN: [id number]   [name] is a 70 y.o.Marland Kitchenfemale who presents for routine followup of her Left breast cancer diagnosed in 2007 and treated with mastectomy/reconstruction and antiestrogen Rx. She has no problems or concerns on either side.  PFSH: She has had no significant changes since the last visit here.  ROS: There have been no significant changes since the last visit here  EXAM: General: The patient is alert, oriented, generally healty appearing, NAD. Mood and affect are normal.  Breasts:  Right side is wnl.Left s/p reconstruction and no evidence of recurrence  Lymphatics: She has no axillary or supraclavicular adenopathy on either side.  Extremities: Full ROM of the surgical side with no lymphedema noted.  Data Reviewed: Mammogram is negative  Impression: Doing well, with no evidence of recurrent cancer or new cancer  Plan: Will continue to follow up in six months.

## 2011-07-23 ENCOUNTER — Encounter: Payer: Self-pay | Admitting: Family Medicine

## 2011-07-24 ENCOUNTER — Ambulatory Visit (INDEPENDENT_AMBULATORY_CARE_PROVIDER_SITE_OTHER): Payer: Medicare Other | Admitting: Family Medicine

## 2011-07-24 ENCOUNTER — Encounter: Payer: Self-pay | Admitting: Family Medicine

## 2011-07-24 VITALS — BP 150/90 | HR 80 | Ht 61.25 in | Wt 156.0 lb

## 2011-07-24 DIAGNOSIS — E785 Hyperlipidemia, unspecified: Secondary | ICD-10-CM

## 2011-07-24 DIAGNOSIS — E669 Obesity, unspecified: Secondary | ICD-10-CM | POA: Insufficient documentation

## 2011-07-24 DIAGNOSIS — Z79899 Other long term (current) drug therapy: Secondary | ICD-10-CM

## 2011-07-24 DIAGNOSIS — Z23 Encounter for immunization: Secondary | ICD-10-CM

## 2011-07-24 DIAGNOSIS — Z853 Personal history of malignant neoplasm of breast: Secondary | ICD-10-CM

## 2011-07-24 LAB — CBC WITH DIFFERENTIAL/PLATELET
Basophils Absolute: 0 10*3/uL (ref 0.0–0.1)
Basophils Relative: 1 % (ref 0–1)
Eosinophils Absolute: 0.1 10*3/uL (ref 0.0–0.7)
Eosinophils Relative: 1 % (ref 0–5)
HCT: 43 % (ref 36.0–46.0)
Hemoglobin: 13.6 g/dL (ref 12.0–15.0)
Lymphocytes Relative: 28 % (ref 12–46)
Lymphs Abs: 1.8 10*3/uL (ref 0.7–4.0)
MCH: 29.2 pg (ref 26.0–34.0)
MCHC: 31.6 g/dL (ref 30.0–36.0)
MCV: 92.3 fL (ref 78.0–100.0)
Monocytes Absolute: 0.5 10*3/uL (ref 0.1–1.0)
Monocytes Relative: 8 % (ref 3–12)
Neutro Abs: 3.9 10*3/uL (ref 1.7–7.7)
Neutrophils Relative %: 62 % (ref 43–77)
Platelets: 289 10*3/uL (ref 150–400)
RBC: 4.66 MIL/uL (ref 3.87–5.11)
RDW: 12.9 % (ref 11.5–15.5)
WBC: 6.3 10*3/uL (ref 4.0–10.5)

## 2011-07-24 LAB — COMPREHENSIVE METABOLIC PANEL
ALT: 13 U/L (ref 0–35)
AST: 15 U/L (ref 0–37)
Albumin: 4.8 g/dL (ref 3.5–5.2)
Alkaline Phosphatase: 74 U/L (ref 39–117)
BUN: 16 mg/dL (ref 6–23)
CO2: 27 mEq/L (ref 19–32)
Calcium: 9.5 mg/dL (ref 8.4–10.5)
Chloride: 105 mEq/L (ref 96–112)
Creat: 0.71 mg/dL (ref 0.50–1.10)
Glucose, Bld: 80 mg/dL (ref 70–99)
Potassium: 4.3 mEq/L (ref 3.5–5.3)
Sodium: 142 mEq/L (ref 135–145)
Total Bilirubin: 0.6 mg/dL (ref 0.3–1.2)
Total Protein: 6.8 g/dL (ref 6.0–8.3)

## 2011-07-24 LAB — LIPID PANEL
Cholesterol: 281 mg/dL — ABNORMAL HIGH (ref 0–200)
HDL: 57 mg/dL (ref >39)
LDL Cholesterol: 209 mg/dL — ABNORMAL HIGH (ref 0–99)
Total CHOL/HDL Ratio: 4.9 Ratio
Triglycerides: 76 mg/dL (ref <150)
VLDL: 15 mg/dL (ref 0–40)

## 2011-07-24 NOTE — Progress Notes (Signed)
  Subjective:    Patient ID: [name], female    DOB: [birth year], 70 y.o.   MRN: [id number]  HPI She is here for an interval evaluation. She was recently seen by her oncologist and released. She will continue to be followed by her surgeon at her request. She has had a recent colonoscopy. She started a weight loss program and has lost approximately 20 pounds. She does have a history of possible statin intolerance and would like to be tried on a different statin drug. Her marriage is going well although she is concerned over the fact that her husband is not as active as she would like him to be. She does have history of osteopenia and is on calcium and vitamin D. She does not smoke. Presently she is not on Arimidex.   Review of Systems Negative except as above    Objective:   Physical Exam alert and in no distress. Tympanic membranes and canals are normal. Throat is clear. Tonsils are normal. Neck is supple without adenopathy or thyromegaly. Cardiac exam shows a regular sinus rhythm without murmurs or gallops. Lungs are clear to auscultation. Abdominal exam shows no masses or tenderness with normal bowel sounds       Assessment & Plan:   1. History of breast cancer  CBC with Differential, Comprehensive metabolic panel  2. Hyperlipidemia LDL goal < 100  Lipid panel  3. Obesity (BMI 30-39.9)  CBC with Differential, Comprehensive metabolic panel, Lipid panel  4. Encounter for long-term (current) use of other medications     a sample of Livalo 2mg . was given. She will let me know how this works. In the future we will order routine labs including Ca27.29 occur

## 2011-07-31 ENCOUNTER — Telehealth: Payer: Self-pay | Admitting: Family Medicine

## 2011-07-31 MED ORDER — PITAVASTATIN CALCIUM 2 MG PO TABS: 1.0000 | ORAL_TABLET | ORAL | Status: DC

## 2011-07-31 NOTE — Telephone Encounter (Signed)
Is this okay?

## 2011-07-31 NOTE — Telephone Encounter (Signed)
I called the medicine in. Have her set up an appointment for recheck on lipids in 2 months

## 2011-07-31 NOTE — Telephone Encounter (Signed)
Pt husband was notified that he medicine was called in and that she needed to call to set up an appt for a 2 month recheck on lipids

## 2011-12-11 ENCOUNTER — Telehealth: Payer: Self-pay | Admitting: Internal Medicine

## 2011-12-11 ENCOUNTER — Ambulatory Visit (INDEPENDENT_AMBULATORY_CARE_PROVIDER_SITE_OTHER): Payer: Medicare Other | Admitting: Internal Medicine

## 2011-12-11 ENCOUNTER — Encounter: Payer: Self-pay | Admitting: Internal Medicine

## 2011-12-11 VITALS — BP 132/80 | HR 89 | Temp 97.5°F | Resp 18 | Ht 62.5 in | Wt 158.0 lb

## 2011-12-11 DIAGNOSIS — I10 Essential (primary) hypertension: Secondary | ICD-10-CM

## 2011-12-11 DIAGNOSIS — E785 Hyperlipidemia, unspecified: Secondary | ICD-10-CM

## 2011-12-11 MED ORDER — PRAVASTATIN SODIUM 20 MG PO TABS: 20.0000 mg | ORAL_TABLET | Freq: Every day | ORAL | Status: DC

## 2011-12-11 NOTE — Patient Instructions (Addendum)
Please schedule fasting labs in 6wks lipid/lft 272.4

## 2011-12-11 NOTE — Telephone Encounter (Signed)
Lab order entered for May 2013. 

## 2011-12-12 NOTE — Assessment & Plan Note (Signed)
Begin outpt blood pressure log to be submitted for review

## 2011-12-12 NOTE — Assessment & Plan Note (Signed)
Attempt pravastatin 20mg  po qd. If tolerates then obtain lipid/lft ~6wks

## 2011-12-12 NOTE — Progress Notes (Signed)
  Subjective:    Patient ID: [name], female    DOB: [birth year], 71 y.o.   MRN: [id number]  HPI Pt presents to clinic for evaluation of multiple medical problems. H/o hyperlipidemia but has had difficulty with statins including lipitor(myalgias) and livalo(rash). Last ldl reviewed 209. HTN without medication currently. Under increased recent stress with her husband's health. Has noted intermittent sbp 150-160. Reviewed preventive care measures and utd mammogram, pap smear and colonoscopy. H/o breast cancer released by H/O now followed q58m by surgery.   Past Medical History  Diagnosis Date  . Hyperlipidemia   . Cancer   . Arthritis   . 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 Dyslipidemia   . Obesity    Past Surgical History  Procedure Date  . Breast surgery February 2007    Left Mastectomy  . Dilation and curettage of uterus   . Excision morton's neuroma     right foot    reports that she has never smoked. She has never used smokeless tobacco. She reports that she does not drink alcohol or use illicit drugs. family history includes Colon cancer in her paternal grandmother; Hyperlipidemia in her mother; Other in her father; Parkinsonism in her mother; and Pneumonia in her mother.  There is no history of Prostate cancer, and Breast cancer, and Heart disease, and Diabetes, . Allergies  Allergen Reactions  . Adhesive (Tape) Other (See Comments)    Causes skin to turn red where touched     Review of Systems  Respiratory: Negative for shortness of breath.   Cardiovascular: Negative for chest pain.  Gastrointestinal: Negative for abdominal pain and blood in stool.  Musculoskeletal: Negative for myalgias and arthralgias.  All other systems reviewed and are negative.       Objective:   Physical Exam  Nursing note and vitals reviewed. Constitutional: She appears well-developed and well-nourished. No distress.  HENT:  Head:  Normocephalic and atraumatic.  Right Ear: External ear normal.  Left Ear: External ear normal.  Nose: Nose normal.  Eyes: Conjunctivae are normal. No scleral icterus.  Neck: Neck supple. No JVD present. No thyromegaly present.  Cardiovascular: Normal rate, regular rhythm and normal heart sounds.  Exam reveals no gallop and no friction rub.   No murmur heard. Pulmonary/Chest: Effort normal and breath sounds normal. No respiratory distress. She has no wheezes. She has no rales.  Lymphadenopathy:    She has no cervical adenopathy.  Neurological: She is alert.  Skin: Skin is warm and dry. She is not diaphoretic.  Psychiatric: She has a normal mood and affect.          Assessment & Plan:

## 2012-01-25 ENCOUNTER — Ambulatory Visit (INDEPENDENT_AMBULATORY_CARE_PROVIDER_SITE_OTHER): Payer: Medicare Other | Admitting: Surgery

## 2012-01-28 ENCOUNTER — Telehealth: Payer: Self-pay | Admitting: *Deleted

## 2012-01-28 NOTE — Telephone Encounter (Signed)
Patient called and left voice message stating she was advised by Dr Rodena Medin to return for blood work for her cholesterol. Her message stated after taking the medication for about 3 weeks, she decided not to refill the medication for the month of May. She stated in her message that she had to bad of a feeling of  tingling in her fingers and joints, and felt she needed to stop the medication. She would like to know if she should try another medication or wait to discuss this at her office visit in July.

## 2012-02-01 ENCOUNTER — Ambulatory Visit (INDEPENDENT_AMBULATORY_CARE_PROVIDER_SITE_OTHER): Payer: Medicare Other | Admitting: Surgery

## 2012-02-01 ENCOUNTER — Encounter (INDEPENDENT_AMBULATORY_CARE_PROVIDER_SITE_OTHER): Payer: Self-pay | Admitting: Surgery

## 2012-02-01 VITALS — BP 170/100 | HR 80 | Temp 97.4°F | Ht 63.0 in | Wt 162.6 lb

## 2012-02-01 DIAGNOSIS — Z853 Personal history of malignant neoplasm of breast: Secondary | ICD-10-CM

## 2012-02-01 NOTE — Progress Notes (Signed)
NAME: [name]       DOB: Apr 24, 1941           DATE: 02/01/2012       MRN: [id number]   CYRIL RAILEY is a 71 y.o.Marland Kitchenfemale who presents for routine followup of her Left breast cancer (DCIS with microinvasion, receptor+)  2007 and treated with mastectomy/reconstruction and antiestrogen Rx. She has no problems or concerns on either side.  PFSH: She has had no significant changes since the last visit here, except that her husband has had a stroke and subsequent carotid surgery  ROS: There have been no significant changes since the last visit here  EXAM: General: The patient is alert, oriented, generally healty appearing, NAD. Mood and affect are normal.  Breasts:  Right side is wnl.Left s/p reconstruction and no evidence of recurrence  Lymphatics: She has no axillary or supraclavicular adenopathy on either side.  Extremities: Full ROM of the surgical side with no lymphedema noted.  Data Reviewed: Mammogram is negative  Impression: Doing well, with no evidence of recurrent cancer or new cancer  Plan: Will continue to follow up in six months.

## 2012-02-02 NOTE — Telephone Encounter (Signed)
Has now had side effects with lipitor, livalo and pravachol. Her chol is quite high though. See if she can try samples of crestor 5mg  qd to see if can tolerate

## 2012-02-05 NOTE — Telephone Encounter (Signed)
Attempted to reach pt at home#, line busy. Will try again tomorrow.

## 2012-02-06 NOTE — Telephone Encounter (Signed)
Left message with pt's husband to have pt return my call. 

## 2012-02-06 NOTE — Telephone Encounter (Signed)
Attempted to reach pt , line busy

## 2012-02-07 MED ORDER — ROSUVASTATIN CALCIUM 5 MG PO TABS: 5.0000 mg | ORAL_TABLET | Freq: Every day | ORAL | Status: DC

## 2012-02-07 NOTE — Telephone Encounter (Signed)
Notified pt, she is agreeable to proceed with samples but will not be able to pick them up until next week. Advised pt that samples have been left at the front desk for pick up.

## 2012-04-07 LAB — LIPID PANEL
Cholesterol: 304 mg/dL — ABNORMAL HIGH (ref 0–200)
HDL: 52 mg/dL (ref >39)
LDL Cholesterol: 227 mg/dL — ABNORMAL HIGH (ref 0–99)
Total CHOL/HDL Ratio: 5.8 Ratio
Triglycerides: 124 mg/dL (ref <150)
VLDL: 25 mg/dL (ref 0–40)

## 2012-04-07 NOTE — Addendum Note (Signed)
Addended by: Mervin Kung A on: 04/07/2012 09:29 AM   Modules accepted: Orders

## 2012-04-07 NOTE — Telephone Encounter (Signed)
Pt returned to the lab, future order released.

## 2012-04-08 LAB — HEPATIC FUNCTION PANEL
ALT: 17 U/L (ref 0–35)
AST: 17 U/L (ref 0–37)
Albumin: 4.7 g/dL (ref 3.5–5.2)
Alkaline Phosphatase: 64 U/L (ref 39–117)
Bilirubin, Direct: 0.1 mg/dL (ref 0.0–0.3)
Indirect Bilirubin: 0.7 mg/dL (ref 0.0–0.9)
Total Bilirubin: 0.8 mg/dL (ref 0.3–1.2)
Total Protein: 6.9 g/dL (ref 6.0–8.3)

## 2012-04-16 ENCOUNTER — Ambulatory Visit (INDEPENDENT_AMBULATORY_CARE_PROVIDER_SITE_OTHER): Payer: Medicare Other | Admitting: Internal Medicine

## 2012-04-16 ENCOUNTER — Encounter: Payer: Self-pay | Admitting: Internal Medicine

## 2012-04-16 VITALS — BP 152/88 | HR 84 | Temp 97.5°F | Resp 16 | Ht 64.0 in | Wt 164.0 lb

## 2012-04-16 DIAGNOSIS — I10 Essential (primary) hypertension: Secondary | ICD-10-CM

## 2012-04-16 DIAGNOSIS — E785 Hyperlipidemia, unspecified: Secondary | ICD-10-CM

## 2012-04-16 MED ORDER — EZETIMIBE-SIMVASTATIN 10-20 MG PO TABS: 1.0000 | ORAL_TABLET | Freq: Every day | ORAL | Status: DC

## 2012-04-16 NOTE — Progress Notes (Signed)
  Subjective:    Patient ID: [name], female    DOB: 03-Apr-1941, 71 y.o.   MRN: [id number]  HPI Pt presents to clinic for followup of multiple medical problems. BP elevated but home BP's nl. Stopped crestor due to myalgias and insomnia. Previously intolerant of lipitor and livalo. Interested in vytorin. utd with gyn who obtains bmds.  Past Medical History  Diagnosis Date  . Hyperlipidemia   . Cancer   . Arthritis   . 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 Dyslipidemia   . Obesity    Past Surgical History  Procedure Date  . Breast surgery February 2007    Left Mastectomy  . Dilation and curettage of uterus   . Excision morton's neuroma     right foot    reports that she has never smoked. She has never used smokeless tobacco. She reports that she does not drink alcohol or use illicit drugs. family history includes Colon cancer in her paternal grandmother; Hyperlipidemia in her mother; Other in her father; Parkinsonism in her mother; and Pneumonia in her mother.  There is no history of Prostate cancer, and Breast cancer, and Heart disease, and Diabetes, . Allergies  Allergen Reactions  . Rosuvastatin Other (See Comments)    Joint & muscle aches, sleep issues.  . Adhesive (Tape) Other (See Comments)    Causes skin to turn red where touched      Review of Systems see hpi     Objective:   Physical Exam  Nursing note and vitals reviewed. Constitutional: She appears well-developed and well-nourished. No distress.  HENT:  Head: Normocephalic and atraumatic.  Neurological: She is alert.  Skin: She is not diaphoretic.  Psychiatric: She has a normal mood and affect.          Assessment & Plan:

## 2012-04-16 NOTE — Patient Instructions (Signed)
Please schedule fasting labs (lipid/lft-272.4) in 8-10 weeks

## 2012-04-19 NOTE — Assessment & Plan Note (Signed)
Intolerant of multiple statins. Attempt vytorin. Samples and prescription provided. Obtain lipid/lft in ~8wks

## 2012-04-19 NOTE — Assessment & Plan Note (Signed)
WC HTN. Normotensive on home monitoring.

## 2012-06-05 ENCOUNTER — Other Ambulatory Visit (INDEPENDENT_AMBULATORY_CARE_PROVIDER_SITE_OTHER): Payer: Self-pay | Admitting: Surgery

## 2012-06-05 DIAGNOSIS — Z1231 Encounter for screening mammogram for malignant neoplasm of breast: Secondary | ICD-10-CM

## 2012-06-12 ENCOUNTER — Telehealth: Payer: Self-pay | Admitting: *Deleted

## 2012-06-12 NOTE — Telephone Encounter (Signed)
Pt reports that she has D/C cholesterol medication d/t side effects [joint & muscle pain] and would like to know if she could be prescribed a lower dosage & change her 10.16.13 ov w/labs prior to November when her husband is coming in/SLS Per Vo, [facility], informed pt's husband that Vytorin was px to her pharmacy and we also have some samples available she can p/u; she is to drop the dosage [from 1 tablet daily] to 1/2 tablet daily and I will call her back tomorrow morning to reschedule her Ov/SLS

## 2012-06-20 ENCOUNTER — Telehealth: Payer: Self-pay | Admitting: *Deleted

## 2012-06-20 NOTE — Telephone Encounter (Signed)
Pt left message wanting to know if she and her husband can get their flu vaccines at their f/u in November.  Attempted to reach pt and left detailed message on home# that it is ok to get vaccines at f/u in November.

## 2012-06-25 ENCOUNTER — Ambulatory Visit: Payer: Medicare Other | Admitting: Internal Medicine

## 2012-06-30 ENCOUNTER — Ambulatory Visit
Admission: RE | Admit: 2012-06-30 | Discharge: 2012-06-30 | Disposition: A | Discharge status: Home / Self Care | Payer: Medicare Other | Source: Ambulatory Visit | Attending: Surgery | Admitting: Surgery

## 2012-06-30 DIAGNOSIS — Z1231 Encounter for screening mammogram for malignant neoplasm of breast: Secondary | ICD-10-CM

## 2012-07-11 ENCOUNTER — Telehealth: Payer: Self-pay | Admitting: *Deleted

## 2012-07-11 DIAGNOSIS — E785 Hyperlipidemia, unspecified: Secondary | ICD-10-CM

## 2012-07-11 LAB — LIPID PANEL
Cholesterol: 175 mg/dL (ref 0–200)
HDL: 57 mg/dL (ref >39)
LDL Cholesterol: 97 mg/dL (ref 0–99)
Total CHOL/HDL Ratio: 3.1 Ratio
Triglycerides: 106 mg/dL (ref <150)
VLDL: 21 mg/dL (ref 0–40)

## 2012-07-11 LAB — HEPATIC FUNCTION PANEL
ALT: 20 U/L (ref 0–35)
AST: 17 U/L (ref 0–37)
Albumin: 4.5 g/dL (ref 3.5–5.2)
Alkaline Phosphatase: 50 U/L (ref 39–117)
Bilirubin, Direct: 0.1 mg/dL (ref 0.0–0.3)
Indirect Bilirubin: 0.6 mg/dL (ref 0.0–0.9)
Total Bilirubin: 0.7 mg/dL (ref 0.3–1.2)
Total Protein: 6.7 g/dL (ref 6.0–8.3)

## 2012-07-11 NOTE — Telephone Encounter (Signed)
Pt presented to the lab, orders entered per 04/2012 office note below: Please schedule fasting labs (lipid/lft-272.4) in 8-10 weeks

## 2012-07-18 ENCOUNTER — Ambulatory Visit (INDEPENDENT_AMBULATORY_CARE_PROVIDER_SITE_OTHER): Payer: Medicare Other | Admitting: Internal Medicine

## 2012-07-18 ENCOUNTER — Encounter: Payer: Self-pay | Admitting: Internal Medicine

## 2012-07-18 VITALS — BP 138/88 | HR 67 | Temp 97.6°F | Resp 14 | Wt 166.0 lb

## 2012-07-18 DIAGNOSIS — E785 Hyperlipidemia, unspecified: Secondary | ICD-10-CM

## 2012-07-18 MED ORDER — EZETIMIBE-SIMVASTATIN 10-20 MG PO TABS: 1.0000 | ORAL_TABLET | Freq: Every day | ORAL | Status: DC

## 2012-07-18 NOTE — Progress Notes (Signed)
  Subjective:    Patient ID: [name], female    DOB: [birth year], 71 y.o.   MRN: [id number]  HPI Pt presents to clinic for followup of multiple medical problems. Tolerating vytorin taking 1/2 tablet daily. No myalgias. Reviewed significantly improved cholesterol panel. H/o WC HTN and bp borderline high. No complaints.  Past Medical History  Diagnosis Date  . Hyperlipidemia   . Cancer   . Arthritis   . 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 Dyslipidemia   . Obesity    Past Surgical History  Procedure Date  . Breast surgery February 2007    Left Mastectomy  . Dilation and curettage of uterus   . Excision morton's neuroma     right foot    reports that she has never smoked. She has never used smokeless tobacco. She reports that she does not drink alcohol or use illicit drugs. family history includes Colon cancer in her paternal grandmother; Hyperlipidemia in her mother; Other in her father; Parkinsonism in her mother; and Pneumonia in her mother.  There is no history of Prostate cancer, and Breast cancer, and Heart disease, and Diabetes, . Allergies  Allergen Reactions  . Rosuvastatin Other (See Comments)    Joint & muscle aches, sleep issues.  . Adhesive (Tape) Other (See Comments)    Causes skin to turn red where touched      Review of Systems see hpi     Objective:   Physical Exam  Physical Exam  Nursing note and vitals reviewed. Constitutional: Appears well-developed and well-nourished. No distress.  HENT:  Head: Normocephalic and atraumatic.  Right Ear: External ear normal.  Left Ear: External ear normal.  Eyes: Conjunctivae are normal. No scleral icterus.  Neck: Neck supple. Carotid bruit is not present.  Cardiovascular: Normal rate, regular rhythm and normal heart sounds.  Exam reveals no gallop and no friction rub.   No murmur heard. Pulmonary/Chest: Effort normal and breath sounds normal. No respiratory distress.  He has no wheezes. no rales.  Neurological:Alert.  Skin: Skin is warm and dry. Not diaphoretic.  Psychiatric: Has a normal mood and affect.        Assessment & Plan:

## 2012-07-18 NOTE — Patient Instructions (Signed)
Please schedule fasting labs prior to next visit Lipid-272.4, chem7-v58.69 

## 2012-07-20 NOTE — Assessment & Plan Note (Signed)
Improved control and at goal. Continue vytorin. Recheck lipid with next visit

## 2012-07-22 ENCOUNTER — Telehealth: Payer: Self-pay | Admitting: Internal Medicine

## 2012-07-22 NOTE — Telephone Encounter (Signed)
Patient's husband called  ,wife seen Friday November 8,  States his wife's foot is swollen more and was told to call if no better, please call  [phone]

## 2012-07-22 NOTE — Telephone Encounter (Signed)
Pt scheduled for OV 11.13.13 at 10:00a/SLS

## 2012-07-23 ENCOUNTER — Ambulatory Visit (INDEPENDENT_AMBULATORY_CARE_PROVIDER_SITE_OTHER): Payer: Medicare Other | Admitting: Internal Medicine

## 2012-07-23 ENCOUNTER — Encounter: Payer: Self-pay | Admitting: Internal Medicine

## 2012-07-23 ENCOUNTER — Ambulatory Visit (HOSPITAL_BASED_OUTPATIENT_CLINIC_OR_DEPARTMENT_OTHER)
Admission: RE | Admit: 2012-07-23 | Discharge: 2012-07-23 | Disposition: A | Discharge status: Home / Self Care | Payer: Medicare Other | Source: Ambulatory Visit | Attending: Internal Medicine | Admitting: Internal Medicine

## 2012-07-23 VITALS — BP 138/84 | HR 82 | Temp 97.8°F | Resp 14 | Wt 166.8 lb

## 2012-07-23 DIAGNOSIS — M79609 Pain in unspecified limb: Secondary | ICD-10-CM | POA: Insufficient documentation

## 2012-07-23 DIAGNOSIS — M7989 Other specified soft tissue disorders: Secondary | ICD-10-CM | POA: Insufficient documentation

## 2012-07-23 DIAGNOSIS — M79672 Pain in left foot: Secondary | ICD-10-CM

## 2012-07-23 DIAGNOSIS — M79673 Pain in unspecified foot: Secondary | ICD-10-CM

## 2012-07-23 NOTE — Progress Notes (Signed)
  Subjective:    Patient ID: [name], female    DOB: September 26, 1940, 71 y.o.   MRN: [id number]  HPI patient presents to clinic for evaluation of worsening left foot pain. Recently stepped out of the car in an attempt to not fall place extra weight on her foot in an opposition. Since that time has had pain and soft tissue swelling primarily left distal foot. More recently over the past several days pain and swelling has increased and is worse with walking. Currently using Tylenol as needed. No other alleviating or exacerbating factors.  Past Medical History  Diagnosis Date  . Hyperlipidemia   . Cancer   . Arthritis   . 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 Dyslipidemia   . Obesity    Past Surgical History  Procedure Date  . Breast surgery February 2007    Left Mastectomy  . Dilation and curettage of uterus   . Excision morton's neuroma     right foot    reports that she has never smoked. She has never used smokeless tobacco. She reports that she does not drink alcohol or use illicit drugs. family history includes Colon cancer in her paternal grandmother; Hyperlipidemia in her mother; Other in her father; Parkinsonism in her mother; and Pneumonia in her mother.  There is no history of Prostate cancer, and Breast cancer, and Heart disease, and Diabetes, . Allergies  Allergen Reactions  . Rosuvastatin Other (See Comments)    Joint & muscle aches, sleep issues.  . Adhesive (Tape) Other (See Comments)    Causes skin to turn red where touched     Review of Systems see history of present illness     Objective:   Physical Exam  Nursing note and vitals reviewed. Constitutional: She appears well-developed and well-nourished. No distress.  HENT:  Head: Normocephalic and atraumatic.  Eyes: Conjunctivae normal are normal.  Neck: Neck supple.  Musculoskeletal:       Left dorsal foot with soft tissue swelling. Increased tenderness left distal  foot with associated erythema and soft tissue swelling. Able to weight bear and ambulate.  Neurological: She is alert.  Skin: She is not diaphoretic.  Psychiatric: She has a normal mood and affect.          Assessment & Plan:

## 2012-07-23 NOTE — Assessment & Plan Note (Signed)
Obtain stat left foot plain radiograph to evaluate for potential fracture. Patient declines prescription pain medication.

## 2012-07-24 ENCOUNTER — Ambulatory Visit (INDEPENDENT_AMBULATORY_CARE_PROVIDER_SITE_OTHER): Payer: Medicare Other | Admitting: Family Medicine

## 2012-07-24 VITALS — BP 169/94 | Ht 63.0 in | Wt 166.0 lb

## 2012-07-24 DIAGNOSIS — M79609 Pain in unspecified limb: Secondary | ICD-10-CM

## 2012-07-24 DIAGNOSIS — L0291 Cutaneous abscess, unspecified: Secondary | ICD-10-CM

## 2012-07-24 DIAGNOSIS — M79672 Pain in left foot: Secondary | ICD-10-CM

## 2012-07-24 DIAGNOSIS — L0390 Cellulitis, unspecified: Secondary | ICD-10-CM

## 2012-07-24 MED ORDER — CEPHALEXIN 500 MG PO CAPS: 500.0000 mg | ORAL_CAPSULE | Freq: Four times a day (QID) | ORAL | Status: AC

## 2012-07-24 NOTE — Patient Instructions (Addendum)
Your ultrasound and x-rays are negative for a fracture or subtle fracture of your metacarpals. Your redness, swelling, significant inflammation by ultrasound in the soft tissues however are consistent with cellulitis. Start keflex four times a day x 10 days until you run out of the medicine. Wear comfortable shoes. Ice 15 minutes at a time 3-4 times a day. If you get a fever over 100.4, the redness gets to the level of your ankle despite antibiotics or you get streaking up your foot, go to the emergency department. Elevate above the level of your heart as much as possible. ACE wrap for compression will help push the swelling down as well. Follow up with me in 1 week.

## 2012-07-25 ENCOUNTER — Encounter: Payer: Self-pay | Admitting: Family Medicine

## 2012-07-25 NOTE — Assessment & Plan Note (Signed)
radiographs and ultrasound negative for fracture.  She is afebrile (temp yesterday at PCPs office) and no obvious breaks in skin but the redness, tenderness of the skin, warmth, with negative imaging is consistent with cellulitis.  Postop shoe provided for support.  Start keflex qid x 10 days.  Discussed red flags to warrant return to our office or ED.  Want to see her back in 1 week for reevaluation.  We discussed foot sprain is another possibility that sometimes will cause a lot of swelling and some redness of skin but usually not to this degree.

## 2012-07-25 NOTE — Progress Notes (Signed)
Subjective:    Patient ID: [name], female    DOB: Nov 23, 1940, 71 y.o.   MRN: [id number]  PCP: Dr. Rodena Medin  HPI 71 yo F here for left foot pain.  Patient reports 9 days ago she was about to fall and jammed her left foot into the ground to help prevent this. Had some immediate pain following this that resolved after a few minutes. Was a little swollen on Friday and had a little limp. Since that time pain and swelling has worsened. Saw PCP yesterday and had x-rays negative for a fracture. Has developed redness distal dorsal foot. No other injuries than the one noted above. Taking tylenol for pain. No fevers, sweats.  Past Medical History  Diagnosis Date  . Hyperlipidemia   . Cancer   . Arthritis   . 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 Dyslipidemia   . Obesity     Current Outpatient Prescriptions on File Prior to Visit  Medication Sig Dispense Refill  . B Complex Vitamins (B COMPLEX 100 PO) Take 100 mg by mouth daily.      . Cholecalciferol (VITAMIN D-3) 5000 UNITS TABS Take 5,000 mg by mouth daily.      . Docosahexaenoic Acid (DHA ALGAL-900 PO) Take 900 mg by mouth daily.      . dorzolamide-timolol (COSOPT) 22.3-6.8 MG/ML ophthalmic solution Place 1 drop into the right eye 2 (two) times daily.      Marland Kitchen ezetimibe-simvastatin (VYTORIN) 10-20 MG per tablet Take 0.5 tablets by mouth at bedtime.        Past Surgical History  Procedure Date  . Breast surgery February 2007    Left Mastectomy  . Dilation and curettage of uterus   . Excision morton's neuroma     right foot    Allergies  Allergen Reactions  . Rosuvastatin Other (See Comments)    Joint & muscle aches, sleep issues.  . Adhesive (Tape) Other (See Comments)    Causes skin to turn red where touched    History   Social History  . Marital Status: Married    Spouse Name: N/A    Number of Children: N/A  . Years of Education: N/A   Occupational History  . Not  on file.   Social History Main Topics  . Smoking status: Never Smoker   . Smokeless tobacco: Never Used  . Alcohol Use: No  . Drug Use: No  . Sexually Active: Not on file   Other Topics Concern  . Not on file   Social History Narrative  . No narrative on file    Family History  Problem Relation Age of Onset  . Pneumonia Mother   . Other Father     bilateral subdural hematoma  . Colon cancer Paternal Grandmother   . Prostate cancer Neg Hx   . Breast cancer Neg Hx   . Heart disease Neg Hx   . Hyperlipidemia Mother   . Parkinsonism Mother   . Diabetes Neg Hx     BP 169/94  Ht 5\' 3"  (1.6 m)  Wt 166 lb (75.297 kg)  BMI 29.41 kg/m2  Review of Systems See HPI above.    Objective:   Physical Exam Gen: NAD  L foot/ankle: Mod swelling foot and ankle but most prominent distal dorsal foot with associated erythema without defined borders.  Warmth in this area and tender to palpation with light and deep touch. FROM ankle without pain. TTP only  in the area noted above from 2nd to 4th MTs. Negative ant drawer and talar tilt.   Negative syndesmotic compression. Thompsons test negative. NV intact distally.  MSK u/s:  2nd through 4th MTs of left foot without bony irregularity, edema or neovascularity overlying bony cortices to suggest fracture.  Significant soft tissue swelling and neovascularity in soft tissues however of the dorsal foot in area of pain.    Assessment & Plan:  1. Left foot pain - radiographs and ultrasound negative for fracture.  She is afebrile (temp yesterday at PCPs office) and no obvious breaks in skin but the redness, tenderness of the skin, warmth, with negative imaging is consistent with cellulitis.  Postop shoe provided for support.  Start keflex qid x 10 days.  Discussed red flags to warrant return to our office or ED.  Want to see her back in 1 week for reevaluation.  We discussed foot sprain is another possibility that sometimes will cause a lot of  swelling and some redness of skin but usually not to this degree.

## 2012-07-31 ENCOUNTER — Encounter: Payer: Self-pay | Admitting: Family Medicine

## 2012-07-31 ENCOUNTER — Ambulatory Visit (INDEPENDENT_AMBULATORY_CARE_PROVIDER_SITE_OTHER): Payer: Medicare Other | Admitting: Family Medicine

## 2012-07-31 ENCOUNTER — Ambulatory Visit: Payer: Medicare Other | Admitting: Family Medicine

## 2012-07-31 VITALS — BP 182/89 | HR 91 | Temp 97.4°F | Ht 63.0 in | Wt 166.0 lb

## 2012-07-31 DIAGNOSIS — M79609 Pain in unspecified limb: Secondary | ICD-10-CM

## 2012-07-31 DIAGNOSIS — M79672 Pain in left foot: Secondary | ICD-10-CM

## 2012-07-31 NOTE — Assessment & Plan Note (Signed)
2/2 cellulitis.  Significant improvement with keflex.  Continue icing, elevation, tylenol.  Postop shoe only as needed - can transition out of this now if feels comfortable enough.  F/u prn.

## 2012-07-31 NOTE — Progress Notes (Signed)
Subjective:    Patient ID: [name], female    DOB: [birth year], 71 y.o.   MRN: [id number]  PCP: Dr. Rodena Medin  HPI  71 yo F here for f/u left foot pain.  11/14: Patient reports 9 days ago she was about to fall and jammed her left foot into the ground to help prevent this. Had some immediate pain following this that resolved after a few minutes. Was a little swollen on Friday and had a little limp. Since that time pain and swelling has worsened. Saw PCP yesterday and had x-rays negative for a fracture. Has developed redness distal dorsal foot. No other injuries than the one noted above. Taking tylenol for pain. No fevers, sweats.  11/21: Patient reports pain, swelling, redness all significantly improved. Pain was a 10/10 and is now down to 2-3/10. Able to ambulate in postop shoe. Some swelling today but has been on her feet a lot today. Has been icing, elevating, taking tylenol arthritis. Tolerating keflex without side effects. No fever, other complaints.  Past Medical History  Diagnosis Date  . Hyperlipidemia   . Cancer   . Arthritis   . 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 Dyslipidemia   . Obesity     Current Outpatient Prescriptions on File Prior to Visit  Medication Sig Dispense Refill  . B Complex Vitamins (B COMPLEX 100 PO) Take 100 mg by mouth daily.      . cephALEXin (KEFLEX) 500 MG capsule Take 1 capsule (500 mg total) by mouth 4 (four) times daily.  40 capsule  0  . Cholecalciferol (VITAMIN D-3) 5000 UNITS TABS Take 5,000 mg by mouth daily.      . Docosahexaenoic Acid (DHA ALGAL-900 PO) Take 900 mg by mouth daily.      . dorzolamide-timolol (COSOPT) 22.3-6.8 MG/ML ophthalmic solution Place 1 drop into the right eye 2 (two) times daily.      Marland Kitchen ezetimibe-simvastatin (VYTORIN) 10-20 MG per tablet Take 0.5 tablets by mouth at bedtime.        Past Surgical History  Procedure Date  . Breast surgery February 2007    Left Mastectomy  . Dilation and curettage of uterus   . Excision morton's neuroma     right foot    Allergies  Allergen Reactions  . Rosuvastatin Other (See Comments)    Joint & muscle aches, sleep issues.  . Adhesive (Tape) Other (See Comments)    Causes skin to turn red where touched    History   Social History  . Marital Status: Married    Spouse Name: N/A    Number of Children: N/A  . Years of Education: N/A   Occupational History  . Not on file.   Social History Main Topics  . Smoking status: Never Smoker   . Smokeless tobacco: Never Used  . Alcohol Use: No  . Drug Use: No  . Sexually Active: Not on file   Other Topics Concern  . Not on file   Social History Narrative  . No narrative on file    Family History  Problem Relation Age of Onset  . Pneumonia Mother   . Other Father     bilateral subdural hematoma  . Colon cancer Paternal Grandmother   . Prostate cancer Neg Hx   . Breast cancer Neg Hx   . Heart disease Neg Hx   . Hyperlipidemia Mother   . Parkinsonism Mother   . Diabetes Neg  Hx     BP 182/89  Pulse 91  Temp 97.4 F (36.3 C) (Oral)  Ht 5\' 3"  (1.6 m)  Wt 166 lb (75.297 kg)  BMI 29.41 kg/m2  Review of Systems  See HPI above.    Objective:   Physical Exam  Gen: NAD  L foot/ankle: Minimal swelling dorsal foot.  Mild warmth but no erythema - significantly improved.  FROM ankle without pain. Minimal TTP over 3rd MT - much improved as well. Negative ant drawer and talar tilt.   Thompsons test negative. NV intact distally.     Assessment & Plan:  1. Left foot pain - 2/2 cellulitis.  Significant improvement with keflex.  Continue icing, elevation, tylenol.  Postop shoe only as needed - can transition out of this now if feels comfortable enough.  F/u prn.

## 2012-08-01 ENCOUNTER — Ambulatory Visit (INDEPENDENT_AMBULATORY_CARE_PROVIDER_SITE_OTHER): Payer: Medicare Other | Admitting: Surgery

## 2012-08-01 ENCOUNTER — Encounter (INDEPENDENT_AMBULATORY_CARE_PROVIDER_SITE_OTHER): Payer: Self-pay | Admitting: Surgery

## 2012-08-01 VITALS — BP 183/101 | HR 82 | Temp 97.8°F | Resp 16 | Ht 63.0 in | Wt 167.2 lb

## 2012-08-01 DIAGNOSIS — Z853 Personal history of malignant neoplasm of breast: Secondary | ICD-10-CM

## 2012-08-01 NOTE — Progress Notes (Signed)
NAME: [name]       DOB: [birth year]           DATE: 08/01/2012       MRN: [id number]   [name] is a 71 y.o.Marland Kitchenfemale who presents for routine followup of her Left breast cancer (DCIS with microinvasion, receptor+)  2007 and treated with mastectomy/reconstruction and antiestrogen Rx. She has no problems or concerns on either side.  PFSH: She has had no significant changes since the last visit here.  ROS: There have been no significant changes since the last visit here  EXAM: General: The patient is alert, oriented, generally healty appearing, NAD. Mood and affect are normal.  Breasts:  Right side is wnl.Left s/p reconstruction and no evidence of recurrence  Lymphatics: She has no axillary or supraclavicular adenopathy on either side.  Extremities: Full ROM of the surgical side with no lymphedema noted.  Data Reviewed: Mammogram  Oct 2013: IMPRESSION:  No mammographic evidence of malignancy.  A result letter of this screening mammogram will be mailed directly  to the patient.  RECOMMENDATION:  Screening mammogram in one year. (Code:SM-B-01Y)  BI-RADS CATEGORY 1: Negative.  Original Report Authenticated By: Britta Mccreedy, M.D.   Impression: Doing well, with no evidence of recurrent cancer or new cancer  Plan: Will continue to follow up in one year

## 2012-08-01 NOTE — Patient Instructions (Signed)
Continue annual mammograms and see me again in one year

## 2012-10-20 ENCOUNTER — Telehealth: Payer: Self-pay

## 2012-10-20 DIAGNOSIS — Z79899 Other long term (current) drug therapy: Secondary | ICD-10-CM

## 2012-10-20 DIAGNOSIS — E785 Hyperlipidemia, unspecified: Secondary | ICD-10-CM

## 2012-10-20 LAB — BASIC METABOLIC PANEL
BUN: 16 mg/dL (ref 6–23)
CO2: 24 mEq/L (ref 19–32)
Calcium: 9.4 mg/dL (ref 8.4–10.5)
Chloride: 107 mEq/L (ref 96–112)
Creat: 0.62 mg/dL (ref 0.50–1.10)
Glucose, Bld: 73 mg/dL (ref 70–99)
Potassium: 4.3 mEq/L (ref 3.5–5.3)
Sodium: 142 mEq/L (ref 135–145)

## 2012-10-20 LAB — LIPID PANEL
Cholesterol: 262 mg/dL — ABNORMAL HIGH (ref 0–200)
HDL: 59 mg/dL (ref >39)
LDL Cholesterol: 177 mg/dL — ABNORMAL HIGH (ref 0–99)
Total CHOL/HDL Ratio: 4.4 Ratio
Triglycerides: 129 mg/dL (ref <150)
VLDL: 26 mg/dL (ref 0–40)

## 2012-10-20 NOTE — Telephone Encounter (Signed)
Patient in today for labs. Labs ordered

## 2012-10-21 NOTE — Telephone Encounter (Signed)
Quick Note:  Patient Informed and voiced understanding ______ 

## 2012-10-21 NOTE — Telephone Encounter (Signed)
Quick Note:  Patient Informed and voiced understanding.  Pt is taking Vytorin 10-20 mg, 1/2 tab daily ______

## 2012-10-23 ENCOUNTER — Ambulatory Visit: Payer: Medicare Other | Admitting: Family Medicine

## 2012-10-24 ENCOUNTER — Ambulatory Visit: Payer: Medicare Other | Admitting: Internal Medicine

## 2012-10-30 ENCOUNTER — Ambulatory Visit (INDEPENDENT_AMBULATORY_CARE_PROVIDER_SITE_OTHER): Payer: Medicare Other | Admitting: Family Medicine

## 2012-10-30 ENCOUNTER — Encounter: Payer: Self-pay | Admitting: Family Medicine

## 2012-10-30 VITALS — BP 146/88 | HR 88 | Temp 98.4°F | Resp 16 | Ht 64.0 in | Wt 168.0 lb

## 2012-10-30 DIAGNOSIS — E785 Hyperlipidemia, unspecified: Secondary | ICD-10-CM

## 2012-10-30 DIAGNOSIS — E559 Vitamin D deficiency, unspecified: Secondary | ICD-10-CM

## 2012-10-30 DIAGNOSIS — H409 Unspecified glaucoma: Secondary | ICD-10-CM

## 2012-10-30 DIAGNOSIS — I10 Essential (primary) hypertension: Secondary | ICD-10-CM

## 2012-10-30 MED ORDER — EZETIMIBE-SIMVASTATIN 10-20 MG PO TABS: 1.0000 | ORAL_TABLET | Freq: Every day | ORAL | Status: DC

## 2012-10-30 NOTE — Patient Instructions (Addendum)
Labs prior to visit lipid, renal, tsh, cbc, hepatic

## 2012-11-02 ENCOUNTER — Encounter: Payer: Self-pay | Admitting: Family Medicine

## 2012-11-02 DIAGNOSIS — H409 Unspecified glaucoma: Secondary | ICD-10-CM

## 2012-11-02 DIAGNOSIS — E559 Vitamin D deficiency, unspecified: Secondary | ICD-10-CM | POA: Insufficient documentation

## 2012-11-02 DIAGNOSIS — I10 Essential (primary) hypertension: Secondary | ICD-10-CM | POA: Insufficient documentation

## 2012-11-02 HISTORY — DX: Unspecified glaucoma: H40.9

## 2012-11-02 HISTORY — DX: Essential (primary) hypertension: I10

## 2012-11-02 NOTE — Progress Notes (Signed)
Patient ID: [name], female   DOB: 06/03/1941, 72 y.o.   MRN: [id number] LEDORA DELKER [id number] 12/24/1940 11/02/2012      Progress Note-Follow Up  Subjective  Chief Complaint  Chief Complaint  Patient presents with  . Hypertension  . Hyperlipidemia    HPI  Patient is a 72 year old female who is in today to have her nails trimmed. She denies any infections or recent illness. No chest pain or palpitations. No shortness of breath.  Past Medical History  Diagnosis Date  . Hyperlipidemia   . Cancer   . Arthritis   . 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 Dyslipidemia   . Obesity   . Glaucoma 11/02/2012  . HTN (hypertension) 11/02/2012    Past Surgical History  Procedure Laterality Date  . Breast surgery  February 2007    Left Mastectomy  . Dilation and curettage of uterus    . Excision morton's neuroma      right foot    Family History  Problem Relation Age of Onset  . Pneumonia Mother   . Other Father     bilateral subdural hematoma  . Colon cancer Paternal Grandmother   . Prostate cancer Neg Hx   . Breast cancer Neg Hx   . Heart disease Neg Hx   . Hyperlipidemia Mother   . Parkinsonism Mother   . Diabetes Neg Hx     History   Social History  . Marital Status: Married    Spouse Name: N/A    Number of Children: N/A  . Years of Education: N/A   Occupational History  . Not on file.   Social History Main Topics  . Smoking status: Never Smoker   . Smokeless tobacco: Never Used  . Alcohol Use: No  . Drug Use: No  . Sexually Active: Not on file   Other Topics Concern  . Not on file   Social History Narrative  . No narrative on file    Current Outpatient Prescriptions on File Prior to Visit  Medication Sig Dispense Refill  . B Complex Vitamins (B COMPLEX 100 PO) Take 100 mg by mouth daily.      . Cholecalciferol (VITAMIN D-3) 5000 UNITS TABS Take 5,000 mg by mouth daily.      . Docosahexaenoic  Acid (DHA ALGAL-900 PO) Take 2 tablets by mouth daily.       . dorzolamide-timolol (COSOPT) 22.3-6.8 MG/ML ophthalmic solution Place 1 drop into the right eye 2 (two) times daily.       No current facility-administered medications on file prior to visit.    Allergies  Allergen Reactions  . Rosuvastatin Other (See Comments)    Joint & muscle aches, sleep issues.  . Adhesive (Tape) Other (See Comments)    Causes skin to turn red where touched    Review of Systems  Review of Systems  Constitutional: Negative for fever and malaise/fatigue.  HENT: Negative for congestion.   Eyes: Negative for discharge.  Respiratory: Negative for shortness of breath.   Cardiovascular: Negative for chest pain, palpitations and leg swelling.  Gastrointestinal: Negative for nausea, abdominal pain, diarrhea and blood in stool.  Genitourinary: Negative for dysuria.  Musculoskeletal: Negative for falls.  Skin: Negative for rash.  Neurological: Negative for loss of consciousness and headaches.  Endo/Heme/Allergies: Negative for polydipsia.  Psychiatric/Behavioral: Negative for depression and suicidal ideas. The patient is not nervous/anxious and does not have insomnia.  Objective  BP 146/88  Pulse 88  Temp(Src) 98.4 F (36.9 C) (Oral)  Resp 16  Ht 5\' 4"  (1.626 m)  Wt 168 lb 0.6 oz (76.222 kg)  BMI 28.83 kg/m2  SpO2 99%  Physical Exam  Physical Exam  Constitutional: She is oriented to person, place, and time and well-developed, well-nourished, and in no distress. No distress.  HENT:  Head: Normocephalic and atraumatic.  Eyes: Conjunctivae are normal.  Neck: Neck supple. No thyromegaly present.  Cardiovascular: Normal rate, regular rhythm and normal heart sounds.   No murmur heard. Pulmonary/Chest: Effort normal and breath sounds normal. She has no wheezes.  Abdominal: She exhibits no distension and no mass.  Musculoskeletal: She exhibits no edema.  Lymphadenopathy:    She has no  cervical adenopathy.  Neurological: She is alert and oriented to person, place, and time.  Skin: Skin is warm and dry. No rash noted. She is not diaphoretic.  Psychiatric: Memory, affect and judgment normal.    No results found for this basename: TSH   Lab Results  Component Value Date   WBC 6.3 07/24/2011   HGB 13.6 07/24/2011   HCT 43.0 07/24/2011   MCV 92.3 07/24/2011   PLT 289 07/24/2011   Lab Results  Component Value Date   CREATININE 0.62 10/20/2012   BUN 16 10/20/2012   NA 142 10/20/2012   K 4.3 10/20/2012   CL 107 10/20/2012   CO2 24 10/20/2012   Lab Results  Component Value Date   ALT 20 07/11/2012   AST 17 07/11/2012   ALKPHOS 50 07/11/2012   BILITOT 0.7 07/11/2012   Lab Results  Component Value Date   CHOL 262* 10/20/2012   Lab Results  Component Value Date   HDL 59 10/20/2012   Lab Results  Component Value Date   LDLCALC 177* 10/20/2012   Lab Results  Component Value Date   TRIG 129 10/20/2012   Lab Results  Component Value Date   CHOLHDL 4.4 10/20/2012     Assessment & Plan  HTN (hypertension) Well controlled , no changes

## 2012-11-02 NOTE — Assessment & Plan Note (Signed)
No recent changes  

## 2012-11-02 NOTE — Assessment & Plan Note (Signed)
Well controlled, no changes 

## 2013-01-27 NOTE — Telephone Encounter (Signed)
Pt never picked up samples. Samples are now expired and have been disposed of.

## 2013-02-18 ENCOUNTER — Telehealth: Payer: Self-pay

## 2013-02-18 DIAGNOSIS — I10 Essential (primary) hypertension: Secondary | ICD-10-CM

## 2013-02-18 DIAGNOSIS — E785 Hyperlipidemia, unspecified: Secondary | ICD-10-CM

## 2013-02-18 LAB — CBC
HCT: 40.3 % (ref 36.0–46.0)
Hemoglobin: 13.3 g/dL (ref 12.0–15.0)
MCH: 29 pg (ref 26.0–34.0)
MCHC: 33 g/dL (ref 30.0–36.0)
MCV: 88 fL (ref 78.0–100.0)
Platelets: 247 10*3/uL (ref 150–400)
RBC: 4.58 MIL/uL (ref 3.87–5.11)
RDW: 13.4 % (ref 11.5–15.5)
WBC: 6.3 10*3/uL (ref 4.0–10.5)

## 2013-02-18 NOTE — Telephone Encounter (Signed)
Labs ordered.

## 2013-02-19 LAB — RENAL FUNCTION PANEL
Albumin: 4.7 g/dL (ref 3.5–5.2)
BUN: 15 mg/dL (ref 6–23)
CO2: 28 mEq/L (ref 19–32)
Calcium: 8.9 mg/dL (ref 8.4–10.5)
Chloride: 107 mEq/L (ref 96–112)
Creat: 0.67 mg/dL (ref 0.50–1.10)
Glucose, Bld: 88 mg/dL (ref 70–99)
Phosphorus: 3.7 mg/dL (ref 2.3–4.6)
Potassium: 4.4 mEq/L (ref 3.5–5.3)
Sodium: 142 mEq/L (ref 135–145)

## 2013-02-19 LAB — HEPATIC FUNCTION PANEL
ALT: 25 U/L (ref 0–35)
AST: 18 U/L (ref 0–37)
Albumin: 4.7 g/dL (ref 3.5–5.2)
Alkaline Phosphatase: 51 U/L (ref 39–117)
Bilirubin, Direct: 0.1 mg/dL (ref 0.0–0.3)
Indirect Bilirubin: 0.6 mg/dL (ref 0.0–0.9)
Total Bilirubin: 0.7 mg/dL (ref 0.3–1.2)
Total Protein: 6.4 g/dL (ref 6.0–8.3)

## 2013-02-19 LAB — LIPID PANEL
Cholesterol: 162 mg/dL (ref 0–200)
HDL: 60 mg/dL (ref >39)
LDL Cholesterol: 85 mg/dL (ref 0–99)
Total CHOL/HDL Ratio: 2.7 Ratio
Triglycerides: 84 mg/dL (ref <150)
VLDL: 17 mg/dL (ref 0–40)

## 2013-02-19 LAB — TSH: TSH: 3.198 u[IU]/mL (ref 0.350–4.500)

## 2013-02-26 ENCOUNTER — Telehealth: Payer: Self-pay | Admitting: Family Medicine

## 2013-02-26 ENCOUNTER — Ambulatory Visit (INDEPENDENT_AMBULATORY_CARE_PROVIDER_SITE_OTHER): Payer: Medicare Other | Admitting: Family Medicine

## 2013-02-26 ENCOUNTER — Encounter: Payer: Self-pay | Admitting: Family Medicine

## 2013-02-26 VITALS — BP 148/86 | HR 90 | Temp 97.9°F | Ht 63.0 in | Wt 172.0 lb

## 2013-02-26 DIAGNOSIS — I10 Essential (primary) hypertension: Secondary | ICD-10-CM

## 2013-02-26 DIAGNOSIS — E669 Obesity, unspecified: Secondary | ICD-10-CM

## 2013-02-26 DIAGNOSIS — E785 Hyperlipidemia, unspecified: Secondary | ICD-10-CM

## 2013-02-26 NOTE — Telephone Encounter (Signed)
Lab order placed.

## 2013-02-26 NOTE — Patient Instructions (Addendum)
Drop the Vytorin 1/2 tab every other day and 1 tab every other Call if pains get worse Labs prior to visit, lipid, renal, cbc tsh, hepatic, vitamin D Cholesterol Cholesterol is a white, waxy, fat-like protein needed by your body in small amounts. The liver makes all the cholesterol you need. It is carried from the liver by the blood through the blood vessels. Deposits (plaque) may build up on blood vessel walls. This makes the arteries narrower and stiffer. Plaque increases the risk for heart attack and stroke. You cannot feel your cholesterol level even if it is very high. The only way to know is by a blood test to check your lipid (fats) levels. Once you know your cholesterol levels, you should keep a record of the test results. Work with your caregiver to to keep your levels in the desired range. WHAT THE RESULTS MEAN:  Total cholesterol is a rough measure of all the cholesterol in your blood.  LDL is the so-called bad cholesterol. This is the type that deposits cholesterol in the walls of the arteries. You want this level to be low.  HDL is the good cholesterol because it cleans the arteries and carries the LDL away. You want this level to be high.  Triglycerides are fat that the body can either burn for energy or store. High levels are closely linked to heart disease. DESIRED LEVELS:  Total cholesterol below 200.  LDL below 100 for people at risk, below 70 for very high risk.  HDL above 50 is good, above 60 is best.  Triglycerides below 150. HOW TO LOWER YOUR CHOLESTEROL:  Diet.  Choose fish or white meat chicken and [place], roasted or baked. Limit fatty cuts of red meat, fried foods, and processed meats, such as sausage and lunch meat.  Eat lots of fresh fruits and vegetables. Choose whole grains, beans, pasta, potatoes and cereals.  Use only small amounts of olive, corn or canola oils. Avoid butter, mayonnaise, shortening or palm kernel oils. Avoid foods with trans-fats.  Use  skim/nonfat milk and low-fat/nonfat yogurt and cheeses. Avoid whole milk, cream, ice cream, egg yolks and cheeses. Healthy desserts include angel food cake, ginger snaps, animal crackers, hard candy, popsicles, and low-fat/nonfat frozen yogurt. Avoid pastries, cakes, pies and cookies.  Exercise.  A regular program helps decrease LDL and raises HDL.  Helps with weight control.  Do things that increase your activity level like gardening, walking, or taking the stairs.  Medication.  May be prescribed by your caregiver to help lowering cholesterol and the risk for heart disease.  You may need medicine even if your levels are normal if you have several risk factors. HOME CARE INSTRUCTIONS   Follow your diet and exercise programs as suggested by your caregiver.  Take medications as directed.  Have blood work done when your caregiver feels it is necessary. MAKE SURE YOU:   Understand these instructions.  Will watch your condition.  Will get help right away if you are not doing well or get worse. Document Released: 05/22/2001 Document Revised: 11/19/2011 Document Reviewed: 11/12/2007 [facility] Patient Information 2014 [place], [place].

## 2013-02-26 NOTE — Telephone Encounter (Signed)
Labs prior to visit, lipid, renal, cbc tsh, hepatic   Patient has an appointment 06/17/13. She will be going to [facility]

## 2013-02-26 NOTE — Assessment & Plan Note (Signed)
Good response to increasing Vytorin back up to full tab as far as cholesterol numbers go but has increased her myalgias, this is confounded by the fact that she has increased her exercise. I will have her continue the exercise and the krill oil and the CoQ10 and drop the Vytorin to 1/2 tab qod and 1 tab po qod and recheck in 3 months

## 2013-02-26 NOTE — Progress Notes (Signed)
Patient ID: [name], female   DOB: 02-Oct-1940, 72 y.o.   MRN: [id number] [name] [id number] 09-02-1941 02/26/2013      Progress Note-Follow Up  Subjective  Chief Complaint  Chief Complaint  Patient presents with  . Follow-up    4 month    HPI  Patient is a 72 year old Caucasian female who is in today for followup. She's been exercising at least 4 times a week since her last visit. She's decreased her carbohydrate intake and feelsoverall better. Does knowledges since increasing her simvastatin/Ezetimibe she has had slightly increased myalgias. No other acute concerns. No chest pain, palpitations, headache, fevers, recent illness, GI or GU concerns are noted today. She is taking her medications as prescribed. He noticed being under a great deal of stress secondary to her husbands health but feels she is managing this adequately.  Past Medical History  Diagnosis Date  . Hyperlipidemia   . Cancer   . Arthritis   . 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 Dyslipidemia   . Obesity   . Glaucoma 11/02/2012  . HTN (hypertension) 11/02/2012    Past Surgical History  Procedure Laterality Date  . Breast surgery  February 2007    Left Mastectomy  . Dilation and curettage of uterus    . Excision morton's neuroma      right foot    Family History  Problem Relation Age of Onset  . Pneumonia Mother   . Other Father     bilateral subdural hematoma  . Colon cancer Paternal Grandmother   . Prostate cancer Neg Hx   . Breast cancer Neg Hx   . Heart disease Neg Hx   . Hyperlipidemia Mother   . Parkinsonism Mother   . Diabetes Neg Hx     History   Social History  . Marital Status: Married    Spouse Name: N/A    Number of Children: N/A  . Years of Education: N/A   Occupational History  . Not on file.   Social History Main Topics  . Smoking status: Never Smoker   . Smokeless tobacco: Never Used  . Alcohol Use: No  . Drug  Use: No  . Sexually Active: Not on file   Other Topics Concern  . Not on file   Social History Narrative  . No narrative on file    Current Outpatient Prescriptions on File Prior to Visit  Medication Sig Dispense Refill  . B Complex Vitamins (B COMPLEX 100 PO) Take 100 mg by mouth daily.      . Cholecalciferol (VITAMIN D-3) 5000 UNITS TABS Take 5,000 mg by mouth daily.      . Coenzyme Q10 (COQ10) 200 MG CAPS Take 1 capsule by mouth daily.      . Docosahexaenoic Acid (DHA ALGAL-900 PO) Take 2 tablets by mouth daily.       . dorzolamide-timolol (COSOPT) 22.3-6.8 MG/ML ophthalmic solution Place 1 drop into the right eye 2 (two) times daily.      Marland Kitchen ezetimibe-simvastatin (VYTORIN) 10-20 MG per tablet Take 1 tablet by mouth at bedtime.      Marland Kitchen latanoprost (XALATAN) 0.005 % ophthalmic solution Place 1 drop into the right eye at bedtime.       No current facility-administered medications on file prior to visit.    Allergies  Allergen Reactions  . Rosuvastatin Other (See Comments)    Joint & muscle aches, sleep issues.  . Adhesive (Tape)  Other (See Comments)    Causes skin to turn red where touched    Review of Systems  Review of Systems  Constitutional: Negative for fever and malaise/fatigue.  HENT: Negative for congestion.   Eyes: Negative for discharge.  Respiratory: Negative for shortness of breath.   Cardiovascular: Negative for chest pain, palpitations and leg swelling.  Gastrointestinal: Negative for nausea, abdominal pain and diarrhea.  Genitourinary: Negative for dysuria.  Musculoskeletal: Positive for myalgias. Negative for falls.  Skin: Negative for rash.  Neurological: Negative for loss of consciousness and headaches.  Endo/Heme/Allergies: Negative for polydipsia.  Psychiatric/Behavioral: Negative for depression and suicidal ideas. The patient is not nervous/anxious and does not have insomnia.     Objective  BP 148/86  Pulse 90  Temp(Src) 97.9 F (36.6 C) (Oral)   Ht 5\' 3"  (1.6 m)  Wt 172 lb (78.019 kg)  BMI 30.48 kg/m2  SpO2 97%  Physical Exam  Physical Exam  Constitutional: She is oriented to person, place, and time and well-developed, well-nourished, and in no distress. No distress.  HENT:  Head: Normocephalic and atraumatic.  Eyes: Conjunctivae are normal.  Neck: Neck supple. No thyromegaly present.  Cardiovascular: Normal rate, regular rhythm and normal heart sounds.   No murmur heard. Pulmonary/Chest: Effort normal and breath sounds normal. She has no wheezes.  Abdominal: She exhibits no distension and no mass.  Musculoskeletal: She exhibits no edema.  Lymphadenopathy:    She has no cervical adenopathy.  Neurological: She is alert and oriented to person, place, and time.  Skin: Skin is warm and dry. No rash noted. She is not diaphoretic.  Psychiatric: Memory, affect and judgment normal.    Lab Results  Component Value Date   TSH 3.198 02/18/2013   Lab Results  Component Value Date   WBC 6.3 02/18/2013   HGB 13.3 02/18/2013   HCT 40.3 02/18/2013   MCV 88.0 02/18/2013   PLT 247 02/18/2013   Lab Results  Component Value Date   CREATININE 0.67 02/18/2013   BUN 15 02/18/2013   NA 142 02/18/2013   K 4.4 02/18/2013   CL 107 02/18/2013   CO2 28 02/18/2013   Lab Results  Component Value Date   ALT 25 02/18/2013   AST 18 02/18/2013   ALKPHOS 51 02/18/2013   BILITOT 0.7 02/18/2013   Lab Results  Component Value Date   CHOL 162 02/18/2013   Lab Results  Component Value Date   HDL 60 02/18/2013   Lab Results  Component Value Date   LDLCALC 85 02/18/2013   Lab Results  Component Value Date   TRIG 84 02/18/2013   Lab Results  Component Value Date   CHOLHDL 2.7 02/18/2013     Assessment & Plan  HTN (hypertension) Well controlled, no changes in meds. Brought in paperwork from a home health visit from her insurance and her bp was 130/70. She reports consistently seeing bp in 120s/70s at home.  Other and unspecified  hyperlipidemia Good response to increasing Vytorin back up to full tab as far as cholesterol numbers go but has increased her myalgias, this is confounded by the fact that she has increased her exercise. I will have her continue the exercise and the krill oil and the CoQ10 and drop the Vytorin to 1/2 tab qod and 1 tab po qod and recheck in 3 months  Obesity (BMI 30-39.9) Stable with good exercise, encouraged ongoing efforts

## 2013-02-26 NOTE — Assessment & Plan Note (Signed)
Well controlled, no changes in meds. Brought in paperwork from a home health visit from her insurance and her bp was 130/70. She reports consistently seeing bp in 120s/70s at home.

## 2013-02-26 NOTE — Assessment & Plan Note (Signed)
Stable with good exercise, encouraged ongoing efforts

## 2013-04-28 ENCOUNTER — Telehealth: Payer: Self-pay

## 2013-04-28 NOTE — Telephone Encounter (Signed)
Patient left a message stating that even though she has lowered her cholesterol medication she is still having a lot of join pain. Pt stated that MD had told her if it continued after the new dosage she had another medication she could try?  Please advise?  I left a detailed message on pts answering machine stating MD was out of the office until Monday 05-04-13, that I would be back in touch with her then. Or if she wanted to know before to return my call and I would send this to the NP or PA

## 2013-04-30 NOTE — Telephone Encounter (Signed)
OK to d/c Vytorin, check with patient and see if she can remember which statins she has failed and then we can rx Crestor 5 mg po daily, disp #30 with 3 rf

## 2013-05-01 MED ORDER — PITAVASTATIN CALCIUM 2 MG PO TABS: ORAL_TABLET | ORAL | Status: DC

## 2013-05-01 NOTE — Telephone Encounter (Signed)
I tried to call pt but it won't let me leave a message on answering machine.  RX sent to pharmacy. I will try to call later

## 2013-05-01 NOTE — Telephone Encounter (Signed)
Thanks, then [name] is her last option. Can start Livalo 2 mg tabs 1/2 tab daily to start if tolerates can increase to 1 tab daily. If she has trouble splitting it then can try 1 tab po qod. Provide sample if we have them if not disp #15 with 2 rf

## 2013-05-01 NOTE — Telephone Encounter (Signed)
Rosuvastatin is on pts medlist for muscle aches?

## 2013-05-25 ENCOUNTER — Other Ambulatory Visit: Payer: Self-pay

## 2013-05-25 DIAGNOSIS — Z9012 Acquired absence of left breast and nipple: Secondary | ICD-10-CM

## 2013-05-25 DIAGNOSIS — Z1231 Encounter for screening mammogram for malignant neoplasm of breast: Secondary | ICD-10-CM

## 2013-06-17 ENCOUNTER — Ambulatory Visit: Payer: Medicare Other | Admitting: Family Medicine

## 2013-06-18 ENCOUNTER — Ambulatory Visit: Payer: Medicare Other | Admitting: Family Medicine

## 2013-06-23 ENCOUNTER — Other Ambulatory Visit: Payer: Self-pay | Admitting: Family Medicine

## 2013-06-23 LAB — CBC
HCT: 40.9 % (ref 36.0–46.0)
Hemoglobin: 13.8 g/dL (ref 12.0–15.0)
MCH: 29.2 pg (ref 26.0–34.0)
MCHC: 33.7 g/dL (ref 30.0–36.0)
MCV: 86.7 fL (ref 78.0–100.0)
Platelets: 266 10*3/uL (ref 150–400)
RBC: 4.72 MIL/uL (ref 3.87–5.11)
RDW: 13.3 % (ref 11.5–15.5)
WBC: 6.4 10*3/uL (ref 4.0–10.5)

## 2013-06-23 LAB — HEPATIC FUNCTION PANEL
ALT: 21 U/L (ref 0–35)
AST: 18 U/L (ref 0–37)
Albumin: 4.2 g/dL (ref 3.5–5.2)
Alkaline Phosphatase: 60 U/L (ref 39–117)
Bilirubin, Direct: 0.1 mg/dL (ref 0.0–0.3)
Indirect Bilirubin: 0.5 mg/dL (ref 0.0–0.9)
Total Bilirubin: 0.6 mg/dL (ref 0.3–1.2)
Total Protein: 6.5 g/dL (ref 6.0–8.3)

## 2013-06-23 LAB — RENAL FUNCTION PANEL
Albumin: 4.2 g/dL (ref 3.5–5.2)
BUN: 14 mg/dL (ref 6–23)
CO2: 29 mEq/L (ref 19–32)
Calcium: 9.2 mg/dL (ref 8.4–10.5)
Chloride: 107 mEq/L (ref 96–112)
Creat: 0.67 mg/dL (ref 0.50–1.10)
Glucose, Bld: 77 mg/dL (ref 70–99)
Phosphorus: 4.3 mg/dL (ref 2.3–4.6)
Potassium: 4.2 mEq/L (ref 3.5–5.3)
Sodium: 143 mEq/L (ref 135–145)

## 2013-06-23 LAB — LIPID PANEL
Cholesterol: 283 mg/dL — ABNORMAL HIGH (ref 0–200)
HDL: 54 mg/dL (ref >39)
LDL Cholesterol: 199 mg/dL — ABNORMAL HIGH (ref 0–99)
Total CHOL/HDL Ratio: 5.2 Ratio
Triglycerides: 148 mg/dL (ref <150)
VLDL: 30 mg/dL (ref 0–40)

## 2013-06-24 LAB — TSH: TSH: 4.672 u[IU]/mL — ABNORMAL HIGH (ref 0.350–4.500)

## 2013-06-24 LAB — T4, FREE: Free T4: 1.26 ng/dL (ref 0.80–1.80)

## 2013-06-29 ENCOUNTER — Ambulatory Visit (INDEPENDENT_AMBULATORY_CARE_PROVIDER_SITE_OTHER): Payer: Medicare Other | Admitting: Family Medicine

## 2013-06-29 ENCOUNTER — Encounter: Payer: Self-pay | Admitting: Family Medicine

## 2013-06-29 VITALS — BP 152/98 | HR 94 | Temp 98.2°F | Ht 63.0 in | Wt 173.1 lb

## 2013-06-29 DIAGNOSIS — E559 Vitamin D deficiency, unspecified: Secondary | ICD-10-CM

## 2013-06-29 DIAGNOSIS — Z853 Personal history of malignant neoplasm of breast: Secondary | ICD-10-CM

## 2013-06-29 DIAGNOSIS — E785 Hyperlipidemia, unspecified: Secondary | ICD-10-CM

## 2013-06-29 DIAGNOSIS — I10 Essential (primary) hypertension: Secondary | ICD-10-CM

## 2013-06-29 DIAGNOSIS — Z23 Encounter for immunization: Secondary | ICD-10-CM

## 2013-06-29 DIAGNOSIS — R946 Abnormal results of thyroid function studies: Secondary | ICD-10-CM

## 2013-06-29 NOTE — Patient Instructions (Signed)

## 2013-07-01 ENCOUNTER — Encounter: Payer: Self-pay | Admitting: Family Medicine

## 2013-07-01 DIAGNOSIS — R946 Abnormal results of thyroid function studies: Secondary | ICD-10-CM

## 2013-07-01 DIAGNOSIS — E039 Hypothyroidism, unspecified: Secondary | ICD-10-CM | POA: Insufficient documentation

## 2013-07-01 HISTORY — DX: Abnormal results of thyroid function studies: R94.6

## 2013-07-01 NOTE — Assessment & Plan Note (Signed)
Doing well, no recent concerning symptoms

## 2013-07-01 NOTE — Assessment & Plan Note (Signed)
tsh mildly elevated but free T4 is normal will continue to monitor

## 2013-07-01 NOTE — Assessment & Plan Note (Signed)
Is tolerating Livalo better at 1/2 tab, has only taken it intermittently, avoid trans fats. Increase exercise

## 2013-07-01 NOTE — Progress Notes (Signed)
Patient ID: [name], female   DOB: [birth year], 72 y.o.   MRN: [id number] [name] [id number] 07/30/1941 07/01/2013      Progress Note-Follow Up  Subjective  Chief Complaint  Chief Complaint  Patient presents with  . Follow-up  . Injections    flu- high dose    HPI  Patient is a 72 year old Caucasian female who is in today for followup. She is in today reporting she's feeling well. No recent illness. No fevers or chills. No headaches, chest pain, palpitations, shortness of breath, GI or GU complaints. He is taking medications as prescribed  Past Medical History  Diagnosis Date  . Hyperlipidemia   . Cancer   . Arthritis   . 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 Dyslipidemia   . Obesity   . Glaucoma 11/02/2012  . HTN (hypertension) 11/02/2012  . Abnormal thyroid function test 07/01/2013    Past Surgical History  Procedure Laterality Date  . Breast surgery  February 2007    Left Mastectomy  . Dilation and curettage of uterus    . Excision morton's neuroma      right foot    Family History  Problem Relation Age of Onset  . Pneumonia Mother   . Other Father     bilateral subdural hematoma  . Colon cancer Paternal Grandmother   . Prostate cancer Neg Hx   . Breast cancer Neg Hx   . Heart disease Neg Hx   . Hyperlipidemia Mother   . Parkinsonism Mother   . Diabetes Neg Hx     History   Social History  . Marital Status: Married    Spouse Name: N/A    Number of Children: N/A  . Years of Education: N/A   Occupational History  . Not on file.   Social History Main Topics  . Smoking status: Never Smoker   . Smokeless tobacco: Never Used  . Alcohol Use: No  . Drug Use: No  . Sexual Activity: Not on file   Other Topics Concern  . Not on file   Social History Narrative  . No narrative on file    Current Outpatient Prescriptions on File Prior to Visit  Medication Sig Dispense Refill  . B Complex  Vitamins (B COMPLEX 100 PO) Take 100 mg by mouth daily.      . Cholecalciferol (VITAMIN D-3) 5000 UNITS TABS Take 5,000 mg by mouth daily.      . Coenzyme Q10 (COQ10) 200 MG CAPS Take 1 capsule by mouth daily.      . Docosahexaenoic Acid (DHA ALGAL-900 PO) Take 2 tablets by mouth daily.       . dorzolamide-timolol (COSOPT) 22.3-6.8 MG/ML ophthalmic solution Place 1 drop into the right eye 2 (two) times daily.      Marland Kitchen latanoprost (XALATAN) 0.005 % ophthalmic solution Place 1 drop into the right eye at bedtime.      . Pitavastatin Calcium (LIVALO) 2 MG TABS 1/2 tab daily, if tolerated increase to 1 tab daily  15 tablet  2   No current facility-administered medications on file prior to visit.    Allergies  Allergen Reactions  . Rosuvastatin Other (See Comments)    Joint & muscle aches, sleep issues.  . Adhesive [Tape] Other (See Comments)    Causes skin to turn red where touched    Review of Systems  Review of Systems  Constitutional: Negative for fever and malaise/fatigue.  HENT:  Negative for congestion.   Eyes: Negative for discharge.  Respiratory: Negative for shortness of breath.   Cardiovascular: Negative for chest pain, palpitations and leg swelling.  Gastrointestinal: Negative for nausea, abdominal pain and diarrhea.  Genitourinary: Negative for dysuria.  Musculoskeletal: Negative for falls.  Skin: Negative for rash.  Neurological: Negative for loss of consciousness and headaches.  Endo/Heme/Allergies: Negative for polydipsia.  Psychiatric/Behavioral: Negative for depression and suicidal ideas. The patient is not nervous/anxious and does not have insomnia.     Objective  BP 152/98  Pulse 94  Temp(Src) 98.2 F (36.8 C) (Oral)  Ht 5\' 3"  (1.6 m)  Wt 173 lb 1.9 oz (78.527 kg)  BMI 30.67 kg/m2  SpO2 94%  Physical Exam  Physical Exam  Constitutional: She is oriented to person, place, and time and well-developed, well-nourished, and in no distress. No distress.  HENT:   Head: Normocephalic and atraumatic.  Eyes: Conjunctivae are normal.  Neck: Neck supple. No thyromegaly present.  Cardiovascular: Normal rate, regular rhythm and normal heart sounds.   No murmur heard. Pulmonary/Chest: Effort normal and breath sounds normal. She has no wheezes.  Abdominal: She exhibits no distension and no mass.  Musculoskeletal: She exhibits no edema.  Lymphadenopathy:    She has no cervical adenopathy.  Neurological: She is alert and oriented to person, place, and time.  Skin: Skin is warm and dry. No rash noted. She is not diaphoretic.  Psychiatric: Memory, affect and judgment normal.    Lab Results  Component Value Date   TSH 4.672* 06/23/2013   Lab Results  Component Value Date   WBC 6.4 06/23/2013   HGB 13.8 06/23/2013   HCT 40.9 06/23/2013   MCV 86.7 06/23/2013   PLT 266 06/23/2013   Lab Results  Component Value Date   CREATININE 0.67 06/23/2013   BUN 14 06/23/2013   NA 143 06/23/2013   K 4.2 06/23/2013   CL 107 06/23/2013   CO2 29 06/23/2013   Lab Results  Component Value Date   ALT 21 06/23/2013   AST 18 06/23/2013   ALKPHOS 60 06/23/2013   BILITOT 0.6 06/23/2013   Lab Results  Component Value Date   CHOL 283* 06/23/2013   Lab Results  Component Value Date   HDL 54 06/23/2013   Lab Results  Component Value Date   LDLCALC 199* 06/23/2013   Lab Results  Component Value Date   TRIG 148 06/23/2013   Lab Results  Component Value Date   CHOLHDL 5.2 06/23/2013     Assessment & Plan  hx: breast cancer, IDC (Microinvasive) w DCIS, 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 Increase exercise  HTN (hypertension) Well controlled, no changes  Abnormal thyroid function test tsh mildly elevated but free T4 is normal will continue to monitor

## 2013-07-01 NOTE — Assessment & Plan Note (Signed)
Well controlled, no changes 

## 2013-07-13 ENCOUNTER — Ambulatory Visit
Admission: RE | Admit: 2013-07-13 | Discharge: 2013-07-13 | Disposition: A | Discharge status: Home / Self Care | Payer: Medicare Other | Source: Ambulatory Visit

## 2013-07-13 DIAGNOSIS — Z1231 Encounter for screening mammogram for malignant neoplasm of breast: Secondary | ICD-10-CM

## 2013-07-13 DIAGNOSIS — Z9012 Acquired absence of left breast and nipple: Secondary | ICD-10-CM

## 2013-08-17 ENCOUNTER — Ambulatory Visit (INDEPENDENT_AMBULATORY_CARE_PROVIDER_SITE_OTHER): Payer: Medicare Other | Admitting: Surgery

## 2013-08-17 ENCOUNTER — Encounter (INDEPENDENT_AMBULATORY_CARE_PROVIDER_SITE_OTHER): Payer: Self-pay

## 2013-08-17 ENCOUNTER — Encounter (INDEPENDENT_AMBULATORY_CARE_PROVIDER_SITE_OTHER): Payer: Self-pay | Admitting: Surgery

## 2013-08-17 VITALS — BP 168/96 | HR 108 | Temp 97.9°F | Resp 15 | Ht 63.0 in | Wt 172.8 lb

## 2013-08-17 DIAGNOSIS — Z853 Personal history of malignant neoplasm of breast: Secondary | ICD-10-CM

## 2013-08-17 NOTE — Progress Notes (Signed)
NAME: [name]       DOB: [birth year]           DATE: 08/17/2013       MRN: [id number]   [name] is a 72 y.o.Marland Kitchenfemale who presents for routine followup of her Left breast cancer (DCIS with microinvasion, receptor+)  2007 and treated with mastectomy/reconstruction and antiestrogen Rx. She has no problems or concerns on either side.  PFSH: She has had no significant changes since the last visit here.  ROS: There have been no significant changes since the last visit here  EXAM: General: The patient is alert, oriented, generally healty appearing, NAD. Mood and affect are normal.  Breasts:  Right side is wnl.Left s/p reconstruction and no evidence of recurrence  Lymphatics: She has no axillary or supraclavicular adenopathy on either side.  Extremities: Full ROM of the surgical side with no lymphedema noted.  Data Reviewed: Mammogram  Nov, 2014: CLINICAL DATA: Screening.  EXAM:  DIGITAL SCREENING UNILATERAL RIGHT MAMMOGRAM WITH CAD  DIGITAL BREAST TOMOSYNTHESIS  Digital breast tomosynthesis images are acquired in two projections.  These images are reviewed in combination with the digital mammogram,  confirming the findings below.  COMPARISON: Previous exam(s).  ACR Breast Density Category b: There are scattered areas of  fibroglandular density.  FINDINGS:  There are no findings suspicious for malignancy. Images were  processed with CAD.  IMPRESSION:  No mammographic evidence of malignancy. A result letter of this  screening mammogram will be mailed directly to the patient.  RECOMMENDATION:  Screening mammogram in one year. (Code:SM-B-01Y)  BI-RADS CATEGORY 1: Negative  Electronically Signed  By: Annia Belt M.D.  On: 07/16/2013 18:24  Original Report Authenticated By: Britta Mccreedy, M.D.   Impression: Doing well, with no evidence of recurrent cancer or new cancer  Plan: Will see back PRN

## 2013-08-17 NOTE — Patient Instructions (Signed)
Continue annual mammograms  

## 2013-10-26 ENCOUNTER — Ambulatory Visit: Payer: Medicare Other | Admitting: Family Medicine

## 2013-11-16 ENCOUNTER — Telehealth: Payer: Self-pay

## 2013-11-16 ENCOUNTER — Other Ambulatory Visit (INDEPENDENT_AMBULATORY_CARE_PROVIDER_SITE_OTHER): Payer: Medicare Other

## 2013-11-16 DIAGNOSIS — E785 Hyperlipidemia, unspecified: Secondary | ICD-10-CM

## 2013-11-16 DIAGNOSIS — I10 Essential (primary) hypertension: Secondary | ICD-10-CM

## 2013-11-16 LAB — RENAL FUNCTION PANEL
Albumin: 4.2 g/dL (ref 3.5–5.2)
BUN: 12 mg/dL (ref 6–23)
CO2: 26 mEq/L (ref 19–32)
Calcium: 9 mg/dL (ref 8.4–10.5)
Chloride: 110 mEq/L (ref 96–112)
Creatinine, Ser: 0.7 mg/dL (ref 0.4–1.2)
GFR: 85.94 mL/min (ref >60.00)
Glucose, Bld: 76 mg/dL (ref 70–99)
Phosphorus: 3.5 mg/dL (ref 2.3–4.6)
Potassium: 4 mEq/L (ref 3.5–5.1)
Sodium: 144 mEq/L (ref 135–145)

## 2013-11-16 LAB — HEPATIC FUNCTION PANEL
ALT: 25 U/L (ref 0–35)
AST: 20 U/L (ref 0–37)
Albumin: 4.2 g/dL (ref 3.5–5.2)
Alkaline Phosphatase: 56 U/L (ref 39–117)
Bilirubin, Direct: 0.1 mg/dL (ref 0.0–0.3)
Total Bilirubin: 0.9 mg/dL (ref 0.3–1.2)
Total Protein: 7 g/dL (ref 6.0–8.3)

## 2013-11-16 LAB — CBC
HCT: 43.5 % (ref 36.0–46.0)
Hemoglobin: 14.2 g/dL (ref 12.0–15.0)
MCHC: 32.7 g/dL (ref 30.0–36.0)
MCV: 89.6 fl (ref 78.0–100.0)
Platelets: 251 10*3/uL (ref 150.0–400.0)
RBC: 4.85 Mil/uL (ref 3.87–5.11)
RDW: 12.8 % (ref 11.5–14.6)
WBC: 6.5 10*3/uL (ref 4.5–10.5)

## 2013-11-16 LAB — TSH: TSH: 1.32 u[IU]/mL (ref 0.35–5.50)

## 2013-11-16 LAB — LIPID PANEL
Cholesterol: 213 mg/dL — ABNORMAL HIGH (ref 0–200)
HDL: 51.9 mg/dL (ref >39.00)
LDL Cholesterol: 143 mg/dL — ABNORMAL HIGH (ref 0–99)
Total CHOL/HDL Ratio: 4
Triglycerides: 90 mg/dL (ref 0.0–149.0)
VLDL: 18 mg/dL (ref 0.0–40.0)

## 2013-11-16 LAB — T4, FREE: Free T4: 0.87 ng/dL (ref 0.60–1.60)

## 2013-11-16 NOTE — Telephone Encounter (Signed)
Lab order placed.

## 2013-11-23 ENCOUNTER — Ambulatory Visit (INDEPENDENT_AMBULATORY_CARE_PROVIDER_SITE_OTHER): Payer: Medicare Other | Admitting: Family Medicine

## 2013-11-23 ENCOUNTER — Encounter: Payer: Self-pay | Admitting: Family Medicine

## 2013-11-23 ENCOUNTER — Telehealth: Payer: Self-pay | Admitting: Family Medicine

## 2013-11-23 VITALS — BP 130/82 | HR 100 | Temp 98.0°F | Ht 63.0 in | Wt 174.0 lb

## 2013-11-23 DIAGNOSIS — E785 Hyperlipidemia, unspecified: Secondary | ICD-10-CM

## 2013-11-23 DIAGNOSIS — L259 Unspecified contact dermatitis, unspecified cause: Secondary | ICD-10-CM

## 2013-11-23 DIAGNOSIS — R946 Abnormal results of thyroid function studies: Secondary | ICD-10-CM

## 2013-11-23 DIAGNOSIS — I10 Essential (primary) hypertension: Secondary | ICD-10-CM

## 2013-11-23 DIAGNOSIS — L309 Dermatitis, unspecified: Secondary | ICD-10-CM

## 2013-11-23 NOTE — Telephone Encounter (Signed)
Return in about 3 months (around 02/23/2014) for lipid, renal, cbc, tsh, hepatic, hgba1c prior. Diagnostic follow-up:  [place] WEEK OF 03-01-2014

## 2013-11-23 NOTE — Progress Notes (Signed)
Pre visit review using our clinic review tool, if applicable. No additional management support is needed unless otherwise documented below in the visit note. 

## 2013-11-23 NOTE — Patient Instructions (Signed)
Salon Pas or Icy Hot cream to left knee as need   Cholesterol Cholesterol is a white, waxy, fat-like protein needed by your body in small amounts. The liver makes all the cholesterol you need. It is carried from the liver by the blood through the blood vessels. Deposits (plaque) may build up on blood vessel walls. This makes the arteries narrower and stiffer. Plaque increases the risk for heart attack and stroke. You cannot feel your cholesterol level even if it is very high. The only way to know is by a blood test to check your lipid (fats) levels. Once you know your cholesterol levels, you should keep a record of the test results. Work with your caregiver to to keep your levels in the desired range. WHAT THE RESULTS MEAN:  Total cholesterol is a rough measure of all the cholesterol in your blood.  LDL is the so-called bad cholesterol. This is the type that deposits cholesterol in the walls of the arteries. You want this level to be low.  HDL is the good cholesterol because it cleans the arteries and carries the LDL away. You want this level to be high.  Triglycerides are fat that the body can either burn for energy or store. High levels are closely linked to heart disease. DESIRED LEVELS:  Total cholesterol below 200.  LDL below 100 for people at risk, below 70 for very high risk.  HDL above 50 is good, above 60 is best.  Triglycerides below 150. HOW TO LOWER YOUR CHOLESTEROL:  Diet.  Choose fish or white meat chicken and [place], roasted or baked. Limit fatty cuts of red meat, fried foods, and processed meats, such as sausage and lunch meat.  Eat lots of fresh fruits and vegetables. Choose whole grains, beans, pasta, potatoes and cereals.  Use only small amounts of olive, corn or canola oils. Avoid butter, mayonnaise, shortening or palm kernel oils. Avoid foods with trans-fats.  Use skim/nonfat milk and low-fat/nonfat yogurt and cheeses. Avoid whole milk, cream, ice cream, egg  yolks and cheeses. Healthy desserts include angel food cake, ginger snaps, animal crackers, hard candy, popsicles, and low-fat/nonfat frozen yogurt. Avoid pastries, cakes, pies and cookies.  Exercise.  A regular program helps decrease LDL and raises HDL.  Helps with weight control.  Do things that increase your activity level like gardening, walking, or taking the stairs.  Medication.  May be prescribed by your caregiver to help lowering cholesterol and the risk for heart disease.  You may need medicine even if your levels are normal if you have several risk factors. HOME CARE INSTRUCTIONS   Follow your diet and exercise programs as suggested by your caregiver.  Take medications as directed.  Have blood work done when your caregiver feels it is necessary. MAKE SURE YOU:   Understand these instructions.  Will watch your condition.  Will get help right away if you are not doing well or get worse. Document Released: 05/22/2001 Document Revised: 11/19/2011 Document Reviewed: 06/10/2013 [facility] Patient Information 2014 [place], [place].

## 2013-11-25 ENCOUNTER — Encounter: Payer: Self-pay | Admitting: Family Medicine

## 2013-11-25 DIAGNOSIS — L309 Dermatitis, unspecified: Secondary | ICD-10-CM

## 2013-11-25 HISTORY — DX: Dermatitis, unspecified: L30.9

## 2013-11-25 NOTE — Assessment & Plan Note (Signed)
Well controlled, no changes to meds. Encouraged heart healthy diet such as the DASH diet and exercise as tolerated.  °

## 2013-11-25 NOTE — Progress Notes (Signed)
Patient ID: [name], female   DOB: Dec 13, 1940, 73 y.o.   MRN: [id number] [name] [id number] Sep 25, 1940 11/25/2013      Progress Note-Follow Up  Subjective  Chief Complaint  Chief Complaint  Patient presents with  . Follow-up    4 month    HPI  Patient is a 73 year old female in today for routine medical care. Patient is in today for routine followup. She's been following with dermatology for dermatitis history better presently. Also follows with podiatry at [facility]. Denies any recent new concerns. No recent illness, fevers or headache. Denies CP/palp/SOB/HA/congestion/fevers/GI or GU c/o. Taking meds as prescribed  Past Medical History  Diagnosis Date  . Hyperlipidemia   . Cancer   . Arthritis   . 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 Dyslipidemia   . Obesity   . Glaucoma 11/02/2012  . HTN (hypertension) 11/02/2012  . Abnormal thyroid function test 07/01/2013  . Dermatitis 11/25/2013    Past Surgical History  Procedure Laterality Date  . Breast surgery  February 2007    Left Mastectomy  . Dilation and curettage of uterus    . Excision morton's neuroma      right foot    Family History  Problem Relation Age of Onset  . Pneumonia Mother   . Other Father     bilateral subdural hematoma  . Colon cancer Paternal Grandmother   . Prostate cancer Neg Hx   . Breast cancer Neg Hx   . Heart disease Neg Hx   . Hyperlipidemia Mother   . Parkinsonism Mother   . Diabetes Neg Hx     History   Social History  . Marital Status: Married    Spouse Name: N/A    Number of Children: N/A  . Years of Education: N/A   Occupational History  . Not on file.   Social History Main Topics  . Smoking status: Never Smoker   . Smokeless tobacco: Never Used  . Alcohol Use: No  . Drug Use: No  . Sexual Activity: Not on file   Other Topics Concern  . Not on file   Social History Narrative  . No narrative on file     Current Outpatient Prescriptions on File Prior to Visit  Medication Sig Dispense Refill  . B Complex Vitamins (B COMPLEX 100 PO) Take 100 mg by mouth daily.      . Cholecalciferol (VITAMIN D-3) 5000 UNITS TABS Take 5,000 mg by mouth daily.      . Coenzyme Q10 (COQ10) 200 MG CAPS Take 1 capsule by mouth daily.      . dorzolamide-timolol (COSOPT) 22.3-6.8 MG/ML ophthalmic solution Place 1 drop into the right eye 2 (two) times daily.      Marland Kitchen latanoprost (XALATAN) 0.005 % ophthalmic solution Place 1 drop into the right eye at bedtime.      . Pitavastatin Calcium (LIVALO) 2 MG TABS 1/2 tab daily, if tolerated increase to 1 tab daily  15 tablet  2   No current facility-administered medications on file prior to visit.    Allergies  Allergen Reactions  . Rosuvastatin Other (See Comments)    Joint & muscle aches, sleep issues.  . Adhesive [Tape] Other (See Comments)    Causes skin to turn red where touched    Review of Systems  Review of Systems  Constitutional: Negative for fever and malaise/fatigue.  HENT: Negative for congestion.   Eyes:  Negative for discharge.  Respiratory: Negative for shortness of breath.   Cardiovascular: Negative for chest pain, palpitations and leg swelling.  Gastrointestinal: Negative for nausea, abdominal pain and diarrhea.  Genitourinary: Negative for dysuria.  Musculoskeletal: Negative for falls.  Skin: Negative for rash.  Neurological: Negative for loss of consciousness and headaches.  Endo/Heme/Allergies: Negative for polydipsia.  Psychiatric/Behavioral: Negative for depression and suicidal ideas. The patient is not nervous/anxious and does not have insomnia.     Objective  BP 130/82  Pulse 100  Temp(Src) 98 F (36.7 C) (Oral)  Ht 5\' 3"  (1.6 m)  Wt 174 lb (78.926 kg)  BMI 30.83 kg/m2  SpO2 91%  Physical Exam  Physical Exam  Constitutional: She is oriented to person, place, and time and well-developed, well-nourished, and in no distress.  No distress.  HENT:  Head: Normocephalic and atraumatic.  Eyes: Conjunctivae are normal.  Neck: Neck supple. No thyromegaly present.  Cardiovascular: Normal rate, regular rhythm and normal heart sounds.   No murmur heard. Pulmonary/Chest: Effort normal and breath sounds normal. She has no wheezes.  Abdominal: She exhibits no distension and no mass.  Musculoskeletal: She exhibits no edema.  Lymphadenopathy:    She has no cervical adenopathy.  Neurological: She is alert and oriented to person, place, and time.  Skin: Skin is warm and dry. No rash noted. She is not diaphoretic.  Psychiatric: Memory, affect and judgment normal.    Lab Results  Component Value Date   TSH 1.32 11/16/2013   Lab Results  Component Value Date   WBC 6.5 11/16/2013   HGB 14.2 11/16/2013   HCT 43.5 11/16/2013   MCV 89.6 11/16/2013   PLT 251.0 11/16/2013   Lab Results  Component Value Date   CREATININE 0.7 11/16/2013   BUN 12 11/16/2013   NA 144 11/16/2013   K 4.0 11/16/2013   CL 110 11/16/2013   CO2 26 11/16/2013   Lab Results  Component Value Date   ALT 25 11/16/2013   AST 20 11/16/2013   ALKPHOS 56 11/16/2013   BILITOT 0.9 11/16/2013   Lab Results  Component Value Date   CHOL 213* 11/16/2013   Lab Results  Component Value Date   HDL 51.90 11/16/2013   Lab Results  Component Value Date   LDLCALC 143* 11/16/2013   Lab Results  Component Value Date   TRIG 90.0 11/16/2013   Lab Results  Component Value Date   CHOLHDL 4 11/16/2013     Assessment & Plan  HTN (hypertension) Well controlled, no changes to meds. Encouraged heart healthy diet such as the DASH diet and exercise as tolerated.   Other and unspecified hyperlipidemia Tolerating statin, encouraged heart healthy diet, avoid trans fats, minimize simple carbs and saturated fats. Increase exercise as tolerated  Abnormal thyroid function test Normalized on recent blood work  Dermatitis Has been following with dermatology, improved at this time

## 2013-11-25 NOTE — Assessment & Plan Note (Signed)
Tolerating statin, encouraged heart healthy diet, avoid trans fats, minimize simple carbs and saturated fats. Increase exercise as tolerated 

## 2013-11-25 NOTE — Assessment & Plan Note (Signed)
Has been following with dermatology, improved at this time

## 2013-11-25 NOTE — Assessment & Plan Note (Signed)
Normalized on recent blood work

## 2013-12-09 LAB — HM PAP SMEAR

## 2013-12-28 ENCOUNTER — Other Ambulatory Visit (INDEPENDENT_AMBULATORY_CARE_PROVIDER_SITE_OTHER): Payer: Self-pay

## 2013-12-28 MED ORDER — UNABLE TO FIND: Status: DC

## 2014-01-29 ENCOUNTER — Encounter: Payer: Self-pay | Admitting: Physician Assistant

## 2014-01-29 ENCOUNTER — Ambulatory Visit (INDEPENDENT_AMBULATORY_CARE_PROVIDER_SITE_OTHER): Payer: Medicare Other | Admitting: Physician Assistant

## 2014-01-29 VITALS — BP 126/84 | HR 127 | Temp 97.8°F | Resp 16 | Ht 63.0 in | Wt 176.5 lb

## 2014-01-29 DIAGNOSIS — J329 Chronic sinusitis, unspecified: Secondary | ICD-10-CM

## 2014-01-29 MED ORDER — AZITHROMYCIN 250 MG PO TABS: ORAL_TABLET | ORAL | Status: DC

## 2014-01-29 NOTE — Progress Notes (Signed)
Pre visit review using our clinic review tool, if applicable. No additional management support is needed unless otherwise documented below in the visit note/SLS  

## 2014-01-29 NOTE — Patient Instructions (Signed)
Please take antibiotic as directed.  Increase fluid intake.  Use Saline nasal spray.  Take a daily multivitamin. Take a daily claritin.  Place a humidifier in the bedroom.  Please call or return clinic if symptoms are not improving.  Sinusitis Sinusitis is redness, soreness, and swelling (inflammation) of the paranasal sinuses. Paranasal sinuses are air pockets within the bones of your face (beneath the eyes, the middle of the forehead, or above the eyes). In healthy paranasal sinuses, mucus is able to drain out, and air is able to circulate through them by way of your nose. However, when your paranasal sinuses are inflamed, mucus and air can become trapped. This can allow bacteria and other germs to grow and cause infection. Sinusitis can develop quickly and last only a short time (acute) or continue over a long period (chronic). Sinusitis that lasts for more than 12 weeks is considered chronic.  CAUSES  Causes of sinusitis include:  Allergies.  Structural abnormalities, such as displacement of the cartilage that separates your nostrils (deviated septum), which can decrease the air flow through your nose and sinuses and affect sinus drainage.  Functional abnormalities, such as when the small hairs (cilia) that line your sinuses and help remove mucus do not work properly or are not present. SYMPTOMS  Symptoms of acute and chronic sinusitis are the same. The primary symptoms are pain and pressure around the affected sinuses. Other symptoms include:  Upper toothache.  Earache.  Headache.  Bad breath.  Decreased sense of smell and taste.  A cough, which worsens when you are lying flat.  Fatigue.  Fever.  Thick drainage from your nose, which often is green and may contain pus (purulent).  Swelling and warmth over the affected sinuses. DIAGNOSIS  Your caregiver will perform a physical exam. During the exam, your caregiver may:  Look in your nose for signs of abnormal growths in your  nostrils (nasal polyps).  Tap over the affected sinus to check for signs of infection.  View the inside of your sinuses (endoscopy) with a special imaging device with a light attached (endoscope), which is inserted into your sinuses. If your caregiver suspects that you have chronic sinusitis, one or more of the following tests may be recommended:  Allergy tests.  Nasal culture A sample of mucus is taken from your nose and sent to a lab and screened for bacteria.  Nasal cytology A sample of mucus is taken from your nose and examined by your caregiver to determine if your sinusitis is related to an allergy. TREATMENT  Most cases of acute sinusitis are related to a viral infection and will resolve on their own within 10 days. Sometimes medicines are prescribed to help relieve symptoms (pain medicine, decongestants, nasal steroid sprays, or saline sprays).  However, for sinusitis related to a bacterial infection, your caregiver will prescribe antibiotic medicines. These are medicines that will help kill the bacteria causing the infection.  Rarely, sinusitis is caused by a fungal infection. In theses cases, your caregiver will prescribe antifungal medicine. For some cases of chronic sinusitis, surgery is needed. Generally, these are cases in which sinusitis recurs more than 3 times per year, despite other treatments. HOME CARE INSTRUCTIONS   Drink plenty of water. Water helps thin the mucus so your sinuses can drain more easily.  Use a humidifier.  Inhale steam 3 to 4 times a day (for example, sit in the bathroom with the shower running).  Apply a warm, moist washcloth to your face 3 to  4 times a day, or as directed by your caregiver.  Use saline nasal sprays to help moisten and clean your sinuses.  Take over-the-counter or prescription medicines for pain, discomfort, or fever only as directed by your caregiver. SEEK IMMEDIATE MEDICAL CARE IF:  You have increasing pain or severe  headaches.  You have nausea, vomiting, or drowsiness.  You have swelling around your face.  You have vision problems.  You have a stiff neck.  You have difficulty breathing. MAKE SURE YOU:   Understand these instructions.  Will watch your condition.  Will get help right away if you are not doing well or get worse. Document Released: 08/27/2005 Document Revised: 11/19/2011 Document Reviewed: 09/11/2011 [facility] Patient Information 2014 [place], [place].

## 2014-02-01 DIAGNOSIS — J329 Chronic sinusitis, unspecified: Secondary | ICD-10-CM | POA: Insufficient documentation

## 2014-02-01 NOTE — Assessment & Plan Note (Signed)
Rx Azithromycin. Increase fluid intake.  Rest.  Saline nasal spray. Mucinex. Humidifier in bedroom. Probiotic.  Call or return to clinic if symptoms are not improving.

## 2014-02-01 NOTE — Progress Notes (Signed)
Patient presents to clinic today c/o > 1 week of sinus pressure, sinus pain, ear fullness, post-nasal drip, tooth pain and non-productive cough.  Denies SOB or wheezing.  Denies fever, chills or aches.  Denies recent travel or sick contact.   Past Medical History  Diagnosis Date  . Hyperlipidemia   . Cancer   . Arthritis   . 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 Dyslipidemia   . Obesity   . Glaucoma 11/02/2012  . HTN (hypertension) 11/02/2012  . Abnormal thyroid function test 07/01/2013  . Dermatitis 11/25/2013    Current Outpatient Prescriptions on File Prior to Visit  Medication Sig Dispense Refill  . B Complex Vitamins (B COMPLEX 100 PO) Take 100 mg by mouth daily.      . Cholecalciferol (VITAMIN D-3) 5000 UNITS TABS Take 5,000 mg by mouth daily.      . Coenzyme Q10 (COQ10) 200 MG CAPS Take 1 capsule by mouth daily.      . dorzolamide-timolol (COSOPT) 22.3-6.8 MG/ML ophthalmic solution Place 1 drop into the right eye 2 (two) times daily.      Marland Kitchen latanoprost (XALATAN) 0.005 % ophthalmic solution Place 1 drop into the right eye at bedtime.      . Pitavastatin Calcium (LIVALO) 2 MG TABS 1/2 tab daily, if tolerated increase to 1 tab daily  15 tablet  2  . UNABLE TO FIND RX: L8000 post surgical bras qty: 6        M8413 silicone breast prosthesis qty: 1 DX: 174.9 left mastectomy  1 each  0   No current facility-administered medications on file prior to visit.    Allergies  Allergen Reactions  . Livalo [Pitavastatin]     ENTERED IN ERROR  . Rosuvastatin Other (See Comments)    Joint & muscle aches, sleep issues.  . Adhesive [Tape] Other (See Comments)    Causes skin to turn red where touched    Family History  Problem Relation Age of Onset  . Pneumonia Mother   . Other Father     bilateral subdural hematoma  . Colon cancer Paternal Grandmother   . Prostate cancer Neg Hx   . Breast cancer Neg Hx   . Heart disease Neg Hx   .  Hyperlipidemia Mother   . Parkinsonism Mother   . Diabetes Neg Hx     History   Social History  . Marital Status: Married    Spouse Name: N/A    Number of Children: N/A  . Years of Education: N/A   Social History Main Topics  . Smoking status: Never Smoker   . Smokeless tobacco: Never Used  . Alcohol Use: No  . Drug Use: No  . Sexual Activity: None   Other Topics Concern  . None   Social History Narrative  . None   Review of Systems - See HPI.  All other ROS are negative.  BP 126/84  Pulse 127  Temp(Src) 97.8 F (36.6 C) (Oral)  Resp 16  Ht 5\' 3"  (1.6 m)  Wt 176 lb 8 oz (80.06 kg)  BMI 31.27 kg/m2  SpO2 98%  Physical Exam  Vitals reviewed. Constitutional: She is oriented to person, place, and time and well-developed, well-nourished, and in no distress.  HENT:  Head: Normocephalic and atraumatic.  Right Ear: External ear normal.  Left Ear: External ear normal.  Nose: Nose normal.  Mouth/Throat: Oropharynx is clear and moist. No oropharyngeal exudate.  TM within  normal limits bilaterally.  + TTP of sinuses.  Eyes: Conjunctivae and EOM are normal. Pupils are equal, round, and reactive to light.  Neck: Neck supple.  Cardiovascular: Normal rate, regular rhythm, normal heart sounds and intact distal pulses.   Pulmonary/Chest: Effort normal and breath sounds normal. No respiratory distress. She has no wheezes. She has no rales. She exhibits no tenderness.  Lymphadenopathy:    She has no cervical adenopathy.  Neurological: She is alert and oriented to person, place, and time.  Skin: Skin is warm and dry. No rash noted.  Psychiatric: Affect normal.    Recent Results (from the past 2160 hour(s))  RENAL FUNCTION PANEL     Status: None   Collection Time    11/16/13  3:08 PM      Result Value Ref Range   Sodium 144  135 - 145 mEq/L   Potassium 4.0  3.5 - 5.1 mEq/L   Chloride 110  96 - 112 mEq/L   CO2 26  19 - 32 mEq/L   Calcium 9.0  8.4 - 10.5 mg/dL   Albumin  4.2  3.5 - 5.2 g/dL   BUN 12  6 - 23 mg/dL   Creatinine, Ser 0.7  0.4 - 1.2 mg/dL   Glucose, Bld 76  70 - 99 mg/dL   Phosphorus 3.5  2.3 - 4.6 mg/dL   GFR 85.94  >60.00 mL/min  CBC     Status: None   Collection Time    11/16/13  3:08 PM      Result Value Ref Range   WBC 6.5  4.5 - 10.5 K/uL   RBC 4.85  3.87 - 5.11 Mil/uL   Platelets 251.0  150.0 - 400.0 K/uL   Hemoglobin 14.2  12.0 - 15.0 g/dL   HCT 43.5  36.0 - 46.0 %   MCV 89.6  78.0 - 100.0 fl   MCHC 32.7  30.0 - 36.0 g/dL   RDW 12.8  11.5 - 14.6 %  TSH     Status: None   Collection Time    11/16/13  3:08 PM      Result Value Ref Range   TSH 1.32  0.35 - 5.50 uIU/mL  T4, FREE     Status: None   Collection Time    11/16/13  3:08 PM      Result Value Ref Range   Free T4 0.87  0.60 - 1.60 ng/dL  HEPATIC FUNCTION PANEL     Status: None   Collection Time    11/16/13  3:08 PM      Result Value Ref Range   Total Bilirubin 0.9  0.3 - 1.2 mg/dL   Bilirubin, Direct 0.1  0.0 - 0.3 mg/dL   Alkaline Phosphatase 56  39 - 117 U/L   AST 20  0 - 37 U/L   ALT 25  0 - 35 U/L   Total Protein 7.0  6.0 - 8.3 g/dL   Albumin 4.2  3.5 - 5.2 g/dL  LIPID PANEL     Status: Abnormal   Collection Time    11/16/13  3:08 PM      Result Value Ref Range   Cholesterol 213 (*) 0 - 200 mg/dL   Comment: ATP III Classification       Desirable:  < 200 mg/dL               Borderline High:  200 - 239 mg/dL          High:  > =  240 mg/dL   Triglycerides 90.0  0.0 - 149.0 mg/dL   Comment: Normal:  <150 mg/dLBorderline High:  150 - 199 mg/dL   HDL 51.90  >39.00 mg/dL   VLDL 18.0  0.0 - 40.0 mg/dL   LDL Cholesterol 143 (*) 0 - 99 mg/dL   Total CHOL/HDL Ratio 4     Comment:                Men          Women1/2 Average Risk     3.4          3.3Average Risk          5.0          4.42X Average Risk          9.6          7.13X Average Risk          15.0          11.0                        Assessment/Plan: Sinusitis Rx Azithromycin. Increase fluid intake.   Rest.  Saline nasal spray. Mucinex. Humidifier in bedroom. Probiotic.  Call or return to clinic if symptoms are not improving.

## 2014-03-08 ENCOUNTER — Ambulatory Visit: Payer: Medicare Other | Admitting: Family Medicine

## 2014-04-07 ENCOUNTER — Other Ambulatory Visit: Payer: Self-pay

## 2014-04-07 MED ORDER — PITAVASTATIN CALCIUM 2 MG PO TABS: 2.0000 mg | ORAL_TABLET | Freq: Every day | ORAL | Status: DC

## 2014-04-12 ENCOUNTER — Other Ambulatory Visit (INDEPENDENT_AMBULATORY_CARE_PROVIDER_SITE_OTHER): Payer: Medicare Other

## 2014-04-12 DIAGNOSIS — E785 Hyperlipidemia, unspecified: Secondary | ICD-10-CM

## 2014-04-12 DIAGNOSIS — R946 Abnormal results of thyroid function studies: Secondary | ICD-10-CM

## 2014-04-12 DIAGNOSIS — I10 Essential (primary) hypertension: Secondary | ICD-10-CM

## 2014-04-12 LAB — CBC
HCT: 43 % (ref 36.0–46.0)
Hemoglobin: 13.9 g/dL (ref 12.0–15.0)
MCHC: 32.3 g/dL (ref 30.0–36.0)
MCV: 90.7 fl (ref 78.0–100.0)
Platelets: 233 10*3/uL (ref 150.0–400.0)
RBC: 4.75 Mil/uL (ref 3.87–5.11)
RDW: 13.1 % (ref 11.5–15.5)
WBC: 6.6 10*3/uL (ref 4.0–10.5)

## 2014-04-12 LAB — HEMOGLOBIN A1C: Hgb A1c MFr Bld: 6 % (ref 4.6–6.5)

## 2014-04-13 LAB — RENAL FUNCTION PANEL
Albumin: 4.1 g/dL (ref 3.5–5.2)
BUN: 13 mg/dL (ref 6–23)
CO2: 25 mEq/L (ref 19–32)
Calcium: 8.8 mg/dL (ref 8.4–10.5)
Chloride: 104 mEq/L (ref 96–112)
Creatinine, Ser: 0.7 mg/dL (ref 0.4–1.2)
GFR: 91.78 mL/min (ref >60.00)
Glucose, Bld: 87 mg/dL (ref 70–99)
Phosphorus: 3.8 mg/dL (ref 2.3–4.6)
Potassium: 4 mEq/L (ref 3.5–5.1)
Sodium: 139 mEq/L (ref 135–145)

## 2014-04-13 LAB — HEPATIC FUNCTION PANEL
ALT: 28 U/L (ref 0–35)
AST: 28 U/L (ref 0–37)
Albumin: 4.1 g/dL (ref 3.5–5.2)
Alkaline Phosphatase: 62 U/L (ref 39–117)
Bilirubin, Direct: 0.1 mg/dL (ref 0.0–0.3)
Total Bilirubin: 1.1 mg/dL (ref 0.2–1.2)
Total Protein: 6.4 g/dL (ref 6.0–8.3)

## 2014-04-13 LAB — LIPID PANEL
Cholesterol: 211 mg/dL — ABNORMAL HIGH (ref 0–200)
HDL: 48.8 mg/dL (ref >39.00)
LDL Cholesterol: 131 mg/dL — ABNORMAL HIGH (ref 0–99)
NonHDL: 162.2
Total CHOL/HDL Ratio: 4
Triglycerides: 157 mg/dL — ABNORMAL HIGH (ref 0.0–149.0)
VLDL: 31.4 mg/dL (ref 0.0–40.0)

## 2014-04-13 LAB — TSH: TSH: 4.86 u[IU]/mL — ABNORMAL HIGH (ref 0.35–4.50)

## 2014-04-15 ENCOUNTER — Ambulatory Visit (INDEPENDENT_AMBULATORY_CARE_PROVIDER_SITE_OTHER): Payer: Medicare Other | Admitting: Family Medicine

## 2014-04-15 ENCOUNTER — Encounter: Payer: Self-pay | Admitting: Family Medicine

## 2014-04-15 VITALS — BP 142/82 | HR 99 | Temp 97.8°F | Ht 63.0 in | Wt 177.1 lb

## 2014-04-15 DIAGNOSIS — E785 Hyperlipidemia, unspecified: Secondary | ICD-10-CM

## 2014-04-15 DIAGNOSIS — Z23 Encounter for immunization: Secondary | ICD-10-CM

## 2014-04-15 DIAGNOSIS — I10 Essential (primary) hypertension: Secondary | ICD-10-CM

## 2014-04-15 DIAGNOSIS — R946 Abnormal results of thyroid function studies: Secondary | ICD-10-CM

## 2014-04-15 DIAGNOSIS — E669 Obesity, unspecified: Secondary | ICD-10-CM

## 2014-04-15 NOTE — Progress Notes (Signed)
Pre visit review using our clinic review tool, if applicable. No additional management support is needed unless otherwise documented below in the visit note. 

## 2014-04-15 NOTE — Patient Instructions (Addendum)
With freeT4Cholesterol Cholesterol is a white, waxy, fat-like substance needed by your body in small amounts. The liver makes all the cholesterol you need. Cholesterol is carried from the liver by the blood through the blood vessels. Deposits of cholesterol (plaque) may build up on blood vessel walls. These make the arteries narrower and stiffer. Cholesterol plaques increase the risk for heart attack and stroke.  You cannot feel your cholesterol level even if it is very high. The only way to know it is high is with a blood test. Once you know your cholesterol levels, you should keep a record of the test results. Work with your health care provider to keep your levels in the desired range.  WHAT DO THE RESULTS MEAN?  Total cholesterol is a rough measure of all the cholesterol in your blood.   LDL is the so-called bad cholesterol. This is the type that deposits cholesterol in the walls of the arteries. You want this level to be low.   HDL is the good cholesterol because it cleans the arteries and carries the LDL away. You want this level to be high.  Triglycerides are fat that the body can either burn for energy or store. High levels are closely linked to heart disease.  WHAT ARE THE DESIRED LEVELS OF CHOLESTEROL?  Total cholesterol below 200.   LDL below 100 for people at risk, below 70 for those at very high risk.   HDL above 50 is good, above 60 is best.   Triglycerides below 150.  HOW CAN I LOWER MY CHOLESTEROL?  Diet. Follow your diet programs as directed by your health care provider.   Choose fish or white meat chicken and [place], roasted or baked. Limit fatty cuts of red meat, fried foods, and processed meats, such as sausage and lunch meats.   Eat lots of fresh fruits and vegetables.  Choose whole grains, beans, pasta, potatoes, and cereals.   Use only small amounts of olive, corn, or canola oils.   Avoid butter, mayonnaise, shortening, or palm kernel oils.  Avoid  foods with trans fats.   Drink skim or nonfat milk and eat low-fat or nonfat yogurt and cheeses. Avoid whole milk, cream, ice cream, egg yolks, and full-fat cheeses.   Healthy desserts include angel food cake, ginger snaps, animal crackers, hard candy, popsicles, and low-fat or nonfat frozen yogurt. Avoid pastries, cakes, pies, and cookies.   Exercise. Follow your exercise programs as directed by your health care provider.   A regular program helps decrease LDL and raise HDL.   A regular program helps with weight control.   Do things that increase your activity level like gardening, walking, or taking the stairs. Ask your health care provider about how you can be more active in your daily life.   Medicine. Take medicine only as directed by your health care provider.   Medicine may be prescribed by your health care provider to help lower cholesterol and decrease the risk for heart disease.   If you have several risk factors, you may need medicine even if your levels are normal. Document Released: 05/22/2001 Document Revised: 01/11/2014 Document Reviewed: 06/10/2013 [facility] Patient Information 2015 [place], [place]. This information is not intended to replace advice given to you by your health care provider. Make sure you discuss any questions you have with your health care provider.

## 2014-04-18 ENCOUNTER — Encounter: Payer: Self-pay | Admitting: Family Medicine

## 2014-04-18 NOTE — Assessment & Plan Note (Signed)
Mild elevation of TSH again. Will repeat with next visit and will proceed with free T4, report any concerning symptoms

## 2014-04-18 NOTE — Assessment & Plan Note (Signed)
Tolerating statin, encouraged heart healthy diet, avoid trans fats, minimize simple carbs and saturated fats. Increase exercise as tolerated 

## 2014-04-18 NOTE — Progress Notes (Signed)
Patient ID: [name], female   DOB: [birth year], 73 y.o.   MRN: [id number] [name] [id number] 1940-11-26 04/18/2014      Progress Note-Follow Up  Subjective  Chief Complaint  Chief Complaint  Patient presents with  . Follow-up    3 month  . Injections    prevnar    HPI  Patient is a 73 year old female in today for routine medical care. Here today with her husband. He is initially in another room and she acknowledges he is struggling, his memory is failing and her responsibility is increasing, she feels she is handling it well. No significant concerns otherwise, no recent illness. Well controlled, no changes to meds. Encouraged heart healthy diet such as the DASH diet and exercise as tolerated.   Past Medical History  Diagnosis Date  . Hyperlipidemia   . Cancer   . Arthritis   . 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 Dyslipidemia   . Obesity   . Glaucoma 11/02/2012  . HTN (hypertension) 11/02/2012  . Abnormal thyroid function test 07/01/2013  . Dermatitis 11/25/2013    Past Surgical History  Procedure Laterality Date  . Breast surgery  February 2007    Left Mastectomy  . Dilation and curettage of uterus    . Excision morton's neuroma      right foot    Family History  Problem Relation Age of Onset  . Pneumonia Mother   . Other Father     bilateral subdural hematoma  . Colon cancer Paternal Grandmother   . Prostate cancer Neg Hx   . Breast cancer Neg Hx   . Heart disease Neg Hx   . Hyperlipidemia Mother   . Parkinsonism Mother   . Diabetes Neg Hx     History   Social History  . Marital Status: Married    Spouse Name: N/A    Number of Children: N/A  . Years of Education: N/A   Occupational History  . Not on file.   Social History Main Topics  . Smoking status: Never Smoker   . Smokeless tobacco: Never Used  . Alcohol Use: No  . Drug Use: No  . Sexual Activity: Not on file   Other Topics Concern   . Not on file   Social History Narrative  . No narrative on file    Current Outpatient Prescriptions on File Prior to Visit  Medication Sig Dispense Refill  . azithromycin (ZITHROMAX) 250 MG tablet Take 2 tablets on Day 1. Then take 1 tablet daily.  6 tablet  0  . B Complex Vitamins (B COMPLEX 100 PO) Take 100 mg by mouth daily.      . Cholecalciferol (VITAMIN D-3) 5000 UNITS TABS Take 5,000 mg by mouth daily.      . Coenzyme Q10 (COQ10) 200 MG CAPS Take 1 capsule by mouth daily.      . dorzolamide-timolol (COSOPT) 22.3-6.8 MG/ML ophthalmic solution Place 1 drop into the right eye 2 (two) times daily.      Javier Docker Oil 1000 MG CAPS Take by mouth. Mega Red      . latanoprost (XALATAN) 0.005 % ophthalmic solution Place 1 drop into the right eye at bedtime.      . Pitavastatin Calcium (LIVALO) 2 MG TABS Take 1 tablet (2 mg total) by mouth daily. 1/2 tab daily, if tolerated increase to 1 tab daily  30 tablet  2  . Probiotic Product (  PROBIOTIC DAILY) CAPS Take by mouth daily.       Marland Kitchen UNABLE TO FIND RX: [id number] post surgical bras qty: 6        [id number] silicone breast prosthesis qty: 1 DX: 174.9 left mastectomy  1 each  0   No current facility-administered medications on file prior to visit.    Allergies  Allergen Reactions  . Livalo [Pitavastatin]     ENTERED IN ERROR  . Rosuvastatin Other (See Comments)    Joint & muscle aches, sleep issues.  . Adhesive [Tape] Other (See Comments)    Causes skin to turn red where touched    Review of Systems  Review of Systems  Constitutional: Negative for fever and malaise/fatigue.  HENT: Negative for congestion.   Eyes: Negative for discharge.  Respiratory: Negative for shortness of breath.   Cardiovascular: Negative for chest pain, palpitations and leg swelling.  Gastrointestinal: Negative for nausea, abdominal pain and diarrhea.  Genitourinary: Negative for dysuria.  Musculoskeletal: Negative for falls.  Skin: Negative for rash.   Neurological: Negative for loss of consciousness and headaches.  Endo/Heme/Allergies: Negative for polydipsia.  Psychiatric/Behavioral: Negative for depression and suicidal ideas. The patient is not nervous/anxious and does not have insomnia.     Objective  BP 142/82  Pulse 99  Temp(Src) 97.8 F (36.6 C) (Oral)  Ht 5\' 3"  (1.6 m)  Wt 177 lb 1.3 oz (80.323 kg)  BMI 31.38 kg/m2  SpO2 96%  Physical Exam  Physical Exam  Constitutional: She is oriented to person, place, and time and well-developed, well-nourished, and in no distress. No distress.  HENT:  Head: Normocephalic and atraumatic.  Eyes: Conjunctivae are normal.  Neck: Neck supple. No thyromegaly present.  Cardiovascular: Normal rate, regular rhythm and normal heart sounds.   No murmur heard. Pulmonary/Chest: Effort normal and breath sounds normal. She has no wheezes.  Abdominal: She exhibits no distension and no mass.  Musculoskeletal: She exhibits no edema.  Lymphadenopathy:    She has no cervical adenopathy.  Neurological: She is alert and oriented to person, place, and time.  Skin: Skin is warm and dry. No rash noted. She is not diaphoretic.  Psychiatric: Memory, affect and judgment normal.    Lab Results  Component Value Date   TSH 4.86* 04/12/2014   Lab Results  Component Value Date   WBC 6.6 04/12/2014   HGB 13.9 04/12/2014   HCT 43.0 04/12/2014   MCV 90.7 04/12/2014   PLT 233.0 04/12/2014   Lab Results  Component Value Date   CREATININE 0.7 04/12/2014   BUN 13 04/12/2014   NA 139 04/12/2014   K 4.0 04/12/2014   CL 104 04/12/2014   CO2 25 04/12/2014   Lab Results  Component Value Date   ALT 28 04/12/2014   AST 28 04/12/2014   ALKPHOS 62 04/12/2014   BILITOT 1.1 04/12/2014   Lab Results  Component Value Date   CHOL 211* 04/12/2014   Lab Results  Component Value Date   HDL 48.80 04/12/2014   Lab Results  Component Value Date   LDLCALC 131* 04/12/2014   Lab Results  Component Value Date   TRIG 157.0* 04/12/2014   Lab  Results  Component Value Date   CHOLHDL 4 04/12/2014     Assessment & Plan   HTN (hypertension) Well controlled, no changes to meds. Encouraged heart healthy diet such as the DASH diet and exercise as tolerated.   Obesity (BMI 30-39.9) Encouraged DASH diet, decrease po intake and increase exercise  as tolerated. Needs 7-8 hours of sleep nightly. Avoid trans fats, eat small, frequent meals every 4-5 hours with lean proteins, complex carbs and healthy fats. Minimize simple carbs, GMO foods.  Other and unspecified hyperlipidemia Tolerating statin, encouraged heart healthy diet, avoid trans fats, minimize simple carbs and saturated fats. Increase exercise as tolerated  Abnormal thyroid function test Mild elevation of TSH again. Will repeat with next visit and will proceed with free T4, report any concerning symptoms

## 2014-04-18 NOTE — Assessment & Plan Note (Signed)
Encouraged DASH diet, decrease po intake and increase exercise as tolerated. Needs 7-8 hours of sleep nightly. Avoid trans fats, eat small, frequent meals every 4-5 hours with lean proteins, complex carbs and healthy fats. Minimize simple carbs, GMO foods. 

## 2014-04-18 NOTE — Assessment & Plan Note (Signed)
Well controlled, no changes to meds. Encouraged heart healthy diet such as the DASH diet and exercise as tolerated.  °

## 2014-06-21 ENCOUNTER — Ambulatory Visit (INDEPENDENT_AMBULATORY_CARE_PROVIDER_SITE_OTHER): Payer: Medicare Other

## 2014-06-21 DIAGNOSIS — Z23 Encounter for immunization: Secondary | ICD-10-CM

## 2014-07-05 ENCOUNTER — Other Ambulatory Visit: Payer: Self-pay

## 2014-07-05 DIAGNOSIS — Z1231 Encounter for screening mammogram for malignant neoplasm of breast: Secondary | ICD-10-CM

## 2014-07-05 DIAGNOSIS — Z9012 Acquired absence of left breast and nipple: Secondary | ICD-10-CM

## 2014-07-23 ENCOUNTER — Ambulatory Visit
Admission: RE | Admit: 2014-07-23 | Discharge: 2014-07-23 | Disposition: A | Discharge status: Home / Self Care | Payer: Medicare Other | Source: Ambulatory Visit

## 2014-07-23 DIAGNOSIS — Z1231 Encounter for screening mammogram for malignant neoplasm of breast: Secondary | ICD-10-CM

## 2014-07-23 DIAGNOSIS — Z9012 Acquired absence of left breast and nipple: Secondary | ICD-10-CM

## 2014-09-21 ENCOUNTER — Ambulatory Visit (INDEPENDENT_AMBULATORY_CARE_PROVIDER_SITE_OTHER): Payer: Medicare Other | Admitting: Family Medicine

## 2014-09-21 ENCOUNTER — Encounter: Payer: Self-pay | Admitting: Family Medicine

## 2014-09-21 VITALS — BP 146/84 | HR 101 | Temp 98.0°F | Resp 16 | Ht 63.0 in | Wt 177.2 lb

## 2014-09-21 DIAGNOSIS — R946 Abnormal results of thyroid function studies: Secondary | ICD-10-CM

## 2014-09-21 DIAGNOSIS — E669 Obesity, unspecified: Secondary | ICD-10-CM

## 2014-09-21 DIAGNOSIS — I10 Essential (primary) hypertension: Secondary | ICD-10-CM

## 2014-09-21 DIAGNOSIS — E782 Mixed hyperlipidemia: Secondary | ICD-10-CM

## 2014-09-21 DIAGNOSIS — Z0000 Encounter for general adult medical examination without abnormal findings: Secondary | ICD-10-CM

## 2014-09-21 DIAGNOSIS — R300 Dysuria: Secondary | ICD-10-CM

## 2014-09-21 NOTE — Progress Notes (Signed)
[name]  [id number] 1941-07-05 09/21/2014      Progress Note-Follow Up  Subjective  Chief Complaint  Chief Complaint  Patient presents with  . Annual Exam    non-fasting    HPI  Patient is a 74 y.o. female in today for routine medical care. Patient in today for annual exam. Her greatest complaint is stress and anxiety. Her husband is significantly older than her and in declining health. She finds herself doing all of the work around the house. Is very worried about her husband's health. No acute illness. But does acknowledge worsening anxiety.  Past Medical History  Diagnosis Date  . Hyperlipidemia   . Cancer   . Arthritis   . 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 Dyslipidemia   . Obesity   . Glaucoma 11/02/2012  . HTN (hypertension) 11/02/2012  . Abnormal thyroid function test 07/01/2013  . Dermatitis 11/25/2013    Past Surgical History  Procedure Laterality Date  . Breast surgery  February 2007    Left Mastectomy  . Dilation and curettage of uterus    . Excision morton's neuroma      right foot    Family History  Problem Relation Age of Onset  . Pneumonia Mother   . Other Father     bilateral subdural hematoma  . Colon cancer Paternal Grandmother   . Prostate cancer Neg Hx   . Breast cancer Neg Hx   . Heart disease Neg Hx   . Hyperlipidemia Mother   . Parkinsonism Mother   . Diabetes Neg Hx     History   Social History  . Marital Status: Married    Spouse Name: N/A    Number of Children: N/A  . Years of Education: N/A   Occupational History  . Not on file.   Social History Main Topics  . Smoking status: Never Smoker   . Smokeless tobacco: Never Used  . Alcohol Use: No  . Drug Use: No  . Sexual Activity: Not on file   Other Topics Concern  . Not on file   Social History Narrative    Current Outpatient Prescriptions on File Prior to Visit  Medication Sig Dispense Refill  . B Complex  Vitamins (B COMPLEX 100 PO) Take 100 mg by mouth daily.    . Cholecalciferol (VITAMIN D-3) 5000 UNITS TABS Take 5,000 mg by mouth daily.    . Coenzyme Q10 (COQ10) 200 MG CAPS Take 1 capsule by mouth daily.    . dorzolamide-timolol (COSOPT) 22.3-6.8 MG/ML ophthalmic solution Place 1 drop into the right eye 2 (two) times daily.    Javier Docker Oil 1000 MG CAPS Take by mouth. Mega Red    . latanoprost (XALATAN) 0.005 % ophthalmic solution Place 1 drop into the right eye at bedtime.    . Pitavastatin Calcium (LIVALO) 2 MG TABS Take 1 tablet (2 mg total) by mouth daily. 1/2 tab daily, if tolerated increase to 1 tab daily 30 tablet 2  . Probiotic Product (PROBIOTIC DAILY) CAPS Take by mouth daily.     Marland Kitchen UNABLE TO FIND RX: [id number] post surgical bras qty: 6        [id number] silicone breast prosthesis qty: 1 DX: 174.9 left mastectomy 1 each 0   No current facility-administered medications on file prior to visit.    Allergies  Allergen Reactions  . Livalo [Pitavastatin]     ENTERED IN ERROR  . Rosuvastatin Other (  See Comments)    Joint & muscle aches, sleep issues.  . Adhesive [Tape] Other (See Comments)    Causes skin to turn red where touched    Review of Systems  Review of Systems  Constitutional: Negative for fever, chills and malaise/fatigue.  HENT: Negative for congestion, hearing loss and nosebleeds.   Eyes: Negative for discharge.  Respiratory: Negative for cough, sputum production, shortness of breath and wheezing.   Cardiovascular: Negative for chest pain, palpitations and leg swelling.  Gastrointestinal: Negative for heartburn, nausea, vomiting, abdominal pain, diarrhea, constipation and blood in stool.  Genitourinary: Negative for dysuria, urgency, frequency and hematuria.  Musculoskeletal: Negative for myalgias, back pain and falls.  Skin: Negative for rash.  Neurological: Negative for dizziness, tremors, sensory change, focal weakness, loss of consciousness, weakness and headaches.    Endo/Heme/Allergies: Negative for polydipsia. Does not bruise/bleed easily.  Psychiatric/Behavioral: Negative for depression and suicidal ideas. The patient is not nervous/anxious and does not have insomnia.     Objective  BP 179/91 mmHg  Pulse 101  Temp(Src) 98 F (36.7 C) (Oral)  Resp 16  Ht 5\' 3"  (1.6 m)  Wt 177 lb 3.2 oz (80.377 kg)  BMI 31.40 kg/m2  SpO2 96%  Physical Exam  Physical Exam  Constitutional: She is oriented to person, place, and time and well-developed, well-nourished, and in no distress. No distress.  HENT:  Head: Normocephalic and atraumatic.  Right Ear: External ear normal.  Left Ear: External ear normal.  Nose: Nose normal.  Mouth/Throat: Oropharynx is clear and moist. No oropharyngeal exudate.  Eyes: Conjunctivae are normal. Pupils are equal, round, and reactive to light. Right eye exhibits no discharge. Left eye exhibits no discharge. No scleral icterus.  Neck: Normal range of motion. Neck supple. No thyromegaly present.  Cardiovascular: Normal rate, regular rhythm, normal heart sounds and intact distal pulses.   No murmur heard. Pulmonary/Chest: Effort normal and breath sounds normal. No respiratory distress. She has no wheezes. She has no rales.  Abdominal: Soft. Bowel sounds are normal. She exhibits no distension and no mass. There is no tenderness.  Musculoskeletal: Normal range of motion. She exhibits no edema or tenderness.  Lymphadenopathy:    She has no cervical adenopathy.  Neurological: She is alert and oriented to person, place, and time. She has normal reflexes. No cranial nerve deficit. Coordination normal.  Skin: Skin is warm and dry. No rash noted. She is not diaphoretic.  Psychiatric: Mood, memory and affect normal.    Lab Results  Component Value Date   TSH 4.86* 04/12/2014   Lab Results  Component Value Date   WBC 6.6 04/12/2014   HGB 13.9 04/12/2014   HCT 43.0 04/12/2014   MCV 90.7 04/12/2014   PLT 233.0 04/12/2014   Lab  Results  Component Value Date   CREATININE 0.7 04/12/2014   BUN 13 04/12/2014   NA 139 04/12/2014   K 4.0 04/12/2014   CL 104 04/12/2014   CO2 25 04/12/2014   Lab Results  Component Value Date   ALT 28 04/12/2014   AST 28 04/12/2014   ALKPHOS 62 04/12/2014   BILITOT 1.1 04/12/2014   Lab Results  Component Value Date   CHOL 211* 04/12/2014   Lab Results  Component Value Date   HDL 48.80 04/12/2014   Lab Results  Component Value Date   LDLCALC 131* 04/12/2014   Lab Results  Component Value Date   TRIG 157.0* 04/12/2014   Lab Results  Component Value Date  CHOLHDL 4 04/12/2014     Assessment & Plan  HTN (hypertension) Improved on recheck and patient reports 135/83 at home prior to coming in. Well controlled, no changes to meds. Encouraged heart healthy diet such as the DASH diet and exercise as tolerated.    Abnormal thyroid function test Improved on recheck.   Hyperlipidemia, mixed Not taking statins including Pitavastatin due to intolerance. Encouraged heart healthy diet, increase exercise, avoid trans fats, consider a krill oil cap daily   Obesity (BMI 30-39.9) Encouraged DASH diet, decrease po intake and increase exercise as tolerated. Needs 7-8 hours of sleep nightly. Avoid trans fats, eat small, frequent meals every 4-5 hours with lean proteins, complex carbs and healthy fats. Minimize simple carbs, GMO foods.   Medicare annual wellness visit, subsequent Patient denies any difficulties at home. No trouble with ADLs, depression or falls. No recent changes to vision or hearing. Is UTD with immunizations. Is UTD with screening. Discussed Advanced Directives, patient agrees to bring [place] copies of documents if can. Encouraged heart healthy diet, exercise as tolerated and adequate sleep. Follows with dermatology Follows with gastroenterology, Dr Deatra Ina, colonoscopy was in 2011, needs repeat in 5-10 years Followed with GYN Dr Milbert Coulter but he retired.  MGM in  November of 2015 Follows with Dr Katy Fitch of opthamology Labs ordered and reviewed.

## 2014-09-21 NOTE — Progress Notes (Signed)
Pre visit review using our clinic review tool, if applicable. No additional management support is needed unless otherwise documented below in the visit note. 

## 2014-09-21 NOTE — Patient Instructions (Signed)
Preventive Care for Adults A healthy lifestyle and preventive care can promote health and wellness. Preventive health guidelines for women include the following key practices.  A routine yearly physical is a good way to check with your health care provider about your health and preventive screening. It is a chance to share any concerns and updates on your health and to receive a thorough exam.  Visit your dentist for a routine exam and preventive care every 6 months. Brush your teeth twice a day and floss once a day. Good oral hygiene prevents tooth decay and gum disease.  The frequency of eye exams is based on your age, health, family medical history, use of contact lenses, and other factors. Follow your health care provider's recommendations for frequency of eye exams.  Eat a healthy diet. Foods like vegetables, fruits, whole grains, low-fat dairy products, and lean protein foods contain the nutrients you need without too many calories. Decrease your intake of foods high in solid fats, added sugars, and salt. Eat the right amount of calories for you.Get information about a proper diet from your health care provider, if necessary.  Regular physical exercise is one of the most important things you can do for your health. Most adults should get at least 150 minutes of moderate-intensity exercise (any activity that increases your heart rate and causes you to sweat) each week. In addition, most adults need muscle-strengthening exercises on 2 or more days a week.  Maintain a healthy weight. The body mass index (BMI) is a screening tool to identify possible weight problems. It provides an estimate of body fat based on height and weight. Your health care provider can find your BMI and can help you achieve or maintain a healthy weight.For adults 20 years and older:  A BMI below 18.5 is considered underweight.  A BMI of 18.5 to 24.9 is normal.  A BMI of 25 to 29.9 is considered overweight.  A BMI of  30 and above is considered obese.  Maintain normal blood lipids and cholesterol levels by exercising and minimizing your intake of saturated fat. Eat a balanced diet with plenty of fruit and vegetables. Blood tests for lipids and cholesterol should begin at age 76 and be repeated every 5 years. If your lipid or cholesterol levels are high, you are over 50, or you are at high risk for heart disease, you may need your cholesterol levels checked more frequently.Ongoing high lipid and cholesterol levels should be treated with medicines if diet and exercise are not working.  If you smoke, find out from your health care provider how to quit. If you do not use tobacco, do not start.  Lung cancer screening is recommended for adults aged 22-80 years who are at high risk for developing lung cancer because of a history of smoking. A yearly low-dose CT scan of the lungs is recommended for people who have at least a 30-pack-year history of smoking and are a current smoker or have quit within the past 15 years. A pack year of smoking is smoking an average of 1 pack of cigarettes a day for 1 year (for example: 1 pack a day for 30 years or 2 packs a day for 15 years). Yearly screening should continue until the smoker has stopped smoking for at least 15 years. Yearly screening should be stopped for people who develop a health problem that would prevent them from having lung cancer treatment.  If you are pregnant, do not drink alcohol. If you are breastfeeding,  be very cautious about drinking alcohol. If you are not pregnant and choose to drink alcohol, do not have more than 1 drink per day. One drink is considered to be 12 ounces (355 mL) of beer, 5 ounces (148 mL) of wine, or 1.5 ounces (44 mL) of liquor.  Avoid use of street drugs. Do not share needles with anyone. Ask for help if you need support or instructions about stopping the use of drugs.  High blood pressure causes heart disease and increases the risk of  stroke. Your blood pressure should be checked at least every 1 to 2 years. Ongoing high blood pressure should be treated with medicines if weight loss and exercise do not work.  If you are 75-52 years old, ask your health care provider if you should take aspirin to prevent strokes.  Diabetes screening involves taking a blood sample to check your fasting blood sugar level. This should be done once every 3 years, after age 15, if you are within normal weight and without risk factors for diabetes. Testing should be considered at a younger age or be carried out more frequently if you are overweight and have at least 1 risk factor for diabetes.  Breast cancer screening is essential preventive care for women. You should practice "breast self-awareness." This means understanding the normal appearance and feel of your breasts and may include breast self-examination. Any changes detected, no matter how small, should be reported to a health care provider. Women in their 58s and 30s should have a clinical breast exam (CBE) by a health care provider as part of a regular health exam every 1 to 3 years. After age 16, women should have a CBE every year. Starting at age 53, women should consider having a mammogram (breast X-ray test) every year. Women who have a family history of breast cancer should talk to their health care provider about genetic screening. Women at a high risk of breast cancer should talk to their health care providers about having an MRI and a mammogram every year.  Breast cancer gene (BRCA)-related cancer risk assessment is recommended for women who have family members with BRCA-related cancers. BRCA-related cancers include breast, ovarian, tubal, and peritoneal cancers. Having family members with these cancers may be associated with an increased risk for harmful changes (mutations) in the breast cancer genes BRCA1 and BRCA2. Results of the assessment will determine the need for genetic counseling and  BRCA1 and BRCA2 testing.  Routine pelvic exams to screen for cancer are no longer recommended for nonpregnant women who are considered low risk for cancer of the pelvic organs (ovaries, uterus, and vagina) and who do not have symptoms. Ask your health care provider if a screening pelvic exam is right for you.  If you have had past treatment for cervical cancer or a condition that could lead to cancer, you need Pap tests and screening for cancer for at least 20 years after your treatment. If Pap tests have been discontinued, your risk factors (such as having a new sexual partner) need to be reassessed to determine if screening should be resumed. Some women have medical problems that increase the chance of getting cervical cancer. In these cases, your health care provider may recommend more frequent screening and Pap tests.  The HPV test is an additional test that may be used for cervical cancer screening. The HPV test looks for the virus that can cause the cell changes on the cervix. The cells collected during the Pap test can be  tested for HPV. The HPV test could be used to screen women aged 30 years and older, and should be used in women of any age who have unclear Pap test results. After the age of 30, women should have HPV testing at the same frequency as a Pap test.  Colorectal cancer can be detected and often prevented. Most routine colorectal cancer screening begins at the age of 50 years and continues through age 75 years. However, your health care provider may recommend screening at an earlier age if you have risk factors for colon cancer. On a yearly basis, your health care provider may provide home test kits to check for hidden blood in the stool. Use of a small camera at the end of a tube, to directly examine the colon (sigmoidoscopy or colonoscopy), can detect the earliest forms of colorectal cancer. Talk to your health care provider about this at age 50, when routine screening begins. Direct  exam of the colon should be repeated every 5-10 years through age 75 years, unless early forms of pre-cancerous polyps or small growths are found.  People who are at an increased risk for hepatitis B should be screened for this virus. You are considered at high risk for hepatitis B if:  You were born in a country where hepatitis B occurs often. Talk with your health care provider about which countries are considered high risk.  Your parents were born in a high-risk country and you have not received a shot to protect against hepatitis B (hepatitis B vaccine).  You have HIV or AIDS.  You use needles to inject street drugs.  You live with, or have sex with, someone who has hepatitis B.  You get hemodialysis treatment.  You take certain medicines for conditions like cancer, organ transplantation, and autoimmune conditions.  Hepatitis C blood testing is recommended for all people born from 1945 through 1965 and any individual with known risks for hepatitis C.  Practice safe sex. Use condoms and avoid high-risk sexual practices to reduce the spread of sexually transmitted infections (STIs). STIs include gonorrhea, chlamydia, syphilis, trichomonas, herpes, HPV, and human immunodeficiency virus (HIV). Herpes, HIV, and HPV are viral illnesses that have no cure. They can result in disability, cancer, and death.  You should be screened for sexually transmitted illnesses (STIs) including gonorrhea and chlamydia if:  You are sexually active and are younger than 24 years.  You are older than 24 years and your health care provider tells you that you are at risk for this type of infection.  Your sexual activity has changed since you were last screened and you are at an increased risk for chlamydia or gonorrhea. Ask your health care provider if you are at risk.  If you are at risk of being infected with HIV, it is recommended that you take a prescription medicine daily to prevent HIV infection. This is  called preexposure prophylaxis (PrEP). You are considered at risk if:  You are a heterosexual woman, are sexually active, and are at increased risk for HIV infection.  You take drugs by injection.  You are sexually active with a partner who has HIV.  Talk with your health care provider about whether you are at high risk of being infected with HIV. If you choose to begin PrEP, you should first be tested for HIV. You should then be tested every 3 months for as long as you are taking PrEP.  Osteoporosis is a disease in which the bones lose minerals and strength   with aging. This can result in serious bone fractures or breaks. The risk of osteoporosis can be identified using a bone density scan. Women ages 65 years and over and women at risk for fractures or osteoporosis should discuss screening with their health care providers. Ask your health care provider whether you should take a calcium supplement or vitamin D to reduce the rate of osteoporosis.  Menopause can be associated with physical symptoms and risks. Hormone replacement therapy is available to decrease symptoms and risks. You should talk to your health care provider about whether hormone replacement therapy is right for you.  Use sunscreen. Apply sunscreen liberally and repeatedly throughout the day. You should seek shade when your shadow is shorter than you. Protect yourself by wearing long sleeves, pants, a wide-brimmed hat, and sunglasses year round, whenever you are outdoors.  Once a month, do a whole body skin exam, using a mirror to look at the skin on your back. Tell your health care provider of new moles, moles that have irregular borders, moles that are larger than a pencil eraser, or moles that have changed in shape or color.  Stay current with required vaccines (immunizations).  Influenza vaccine. All adults should be immunized every year.  Tetanus, diphtheria, and acellular pertussis (Td, Tdap) vaccine. Pregnant women should  receive 1 dose of Tdap vaccine during each pregnancy. The dose should be obtained regardless of the length of time since the last dose. Immunization is preferred during the 27th-36th week of gestation. An adult who has not previously received Tdap or who does not know her vaccine status should receive 1 dose of Tdap. This initial dose should be followed by tetanus and diphtheria toxoids (Td) booster doses every 10 years. Adults with an unknown or incomplete history of completing a 3-dose immunization series with Td-containing vaccines should begin or complete a primary immunization series including a Tdap dose. Adults should receive a Td booster every 10 years.  Varicella vaccine. An adult without evidence of immunity to varicella should receive 2 doses or a second dose if she has previously received 1 dose. Pregnant females who do not have evidence of immunity should receive the first dose after pregnancy. This first dose should be obtained before leaving the health care facility. The second dose should be obtained 4-8 weeks after the first dose.  Human papillomavirus (HPV) vaccine. Females aged 13-26 years who have not received the vaccine previously should obtain the 3-dose series. The vaccine is not recommended for use in pregnant females. However, pregnancy testing is not needed before receiving a dose. If a female is found to be pregnant after receiving a dose, no treatment is needed. In that case, the remaining doses should be delayed until after the pregnancy. Immunization is recommended for any person with an immunocompromised condition through the age of 26 years if she did not get any or all doses earlier. During the 3-dose series, the second dose should be obtained 4-8 weeks after the first dose. The third dose should be obtained 24 weeks after the first dose and 16 weeks after the second dose.  Zoster vaccine. One dose is recommended for adults aged 60 years or older unless certain conditions are  present.  Measles, mumps, and rubella (MMR) vaccine. Adults born before 1957 generally are considered immune to measles and mumps. Adults born in 1957 or later should have 1 or more doses of MMR vaccine unless there is a contraindication to the vaccine or there is laboratory evidence of immunity to   each of the three diseases. A routine second dose of MMR vaccine should be obtained at least 28 days after the first dose for students attending postsecondary schools, health care workers, or international travelers. People who received inactivated measles vaccine or an unknown type of measles vaccine during 1963-1967 should receive 2 doses of MMR vaccine. People who received inactivated mumps vaccine or an unknown type of mumps vaccine before 1979 and are at high risk for mumps infection should consider immunization with 2 doses of MMR vaccine. For females of childbearing age, rubella immunity should be determined. If there is no evidence of immunity, females who are not pregnant should be vaccinated. If there is no evidence of immunity, females who are pregnant should delay immunization until after pregnancy. Unvaccinated health care workers born before 1957 who lack laboratory evidence of measles, mumps, or rubella immunity or laboratory confirmation of disease should consider measles and mumps immunization with 2 doses of MMR vaccine or rubella immunization with 1 dose of MMR vaccine.  Pneumococcal 13-valent conjugate (PCV13) vaccine. When indicated, a person who is uncertain of her immunization history and has no record of immunization should receive the PCV13 vaccine. An adult aged 19 years or older who has certain medical conditions and has not been previously immunized should receive 1 dose of PCV13 vaccine. This PCV13 should be followed with a dose of pneumococcal polysaccharide (PPSV23) vaccine. The PPSV23 vaccine dose should be obtained at least 8 weeks after the dose of PCV13 vaccine. An adult aged 19  years or older who has certain medical conditions and previously received 1 or more doses of PPSV23 vaccine should receive 1 dose of PCV13. The PCV13 vaccine dose should be obtained 1 or more years after the last PPSV23 vaccine dose.  Pneumococcal polysaccharide (PPSV23) vaccine. When PCV13 is also indicated, PCV13 should be obtained first. All adults aged 65 years and older should be immunized. An adult younger than age 65 years who has certain medical conditions should be immunized. Any person who resides in a nursing home or long-term care facility should be immunized. An adult smoker should be immunized. People with an immunocompromised condition and certain other conditions should receive both PCV13 and PPSV23 vaccines. People with human immunodeficiency virus (HIV) infection should be immunized as soon as possible after diagnosis. Immunization during chemotherapy or radiation therapy should be avoided. Routine use of PPSV23 vaccine is not recommended for American Indians, [place] Natives, or people younger than 65 years unless there are medical conditions that require PPSV23 vaccine. When indicated, people who have unknown immunization and have no record of immunization should receive PPSV23 vaccine. One-time revaccination 5 years after the first dose of PPSV23 is recommended for people aged 19-64 years who have chronic kidney failure, nephrotic syndrome, asplenia, or immunocompromised conditions. People who received 1-2 doses of PPSV23 before age 65 years should receive another dose of PPSV23 vaccine at age 65 years or later if at least 5 years have passed since the previous dose. Doses of PPSV23 are not needed for people immunized with PPSV23 at or after age 65 years.  Meningococcal vaccine. Adults with asplenia or persistent complement component deficiencies should receive 2 doses of quadrivalent meningococcal conjugate (MenACWY-D) vaccine. The doses should be obtained at least 2 months apart.  Microbiologists working with certain meningococcal bacteria, military recruits, people at risk during an outbreak, and people who travel to or live in countries with a high rate of meningitis should be immunized. A first-year college student up through age   21 years who is living in a residence hall should receive a dose if she did not receive a dose on or after her 16th birthday. Adults who have certain high-risk conditions should receive one or more doses of vaccine.  Hepatitis A vaccine. Adults who wish to be protected from this disease, have certain high-risk conditions, work with hepatitis A-infected animals, work in hepatitis A research labs, or travel to or work in countries with a high rate of hepatitis A should be immunized. Adults who were previously unvaccinated and who anticipate close contact with an international adoptee during the first 60 days after arrival in the [place] from a country with a high rate of hepatitis A should be immunized.  Hepatitis B vaccine. Adults who wish to be protected from this disease, have certain high-risk conditions, may be exposed to blood or other infectious body fluids, are household contacts or sex partners of hepatitis B positive people, are clients or workers in certain care facilities, or travel to or work in countries with a high rate of hepatitis B should be immunized.  Haemophilus influenzae type b (Hib) vaccine. A previously unvaccinated person with asplenia or sickle cell disease or having a scheduled splenectomy should receive 1 dose of Hib vaccine. Regardless of previous immunization, a recipient of a hematopoietic stem cell transplant should receive a 3-dose series 6-12 months after her successful transplant. Hib vaccine is not recommended for adults with HIV infection. Preventive Services / Frequency Ages 64 to 68 years  Blood pressure check.** / Every 1 to 2 years.  Lipid and cholesterol check.** / Every 5 years beginning at age  22.  Clinical breast exam.** / Every 3 years for women in their 88s and 53s.  BRCA-related cancer risk assessment.** / For women who have family members with a BRCA-related cancer (breast, ovarian, tubal, or peritoneal cancers).  Pap test.** / Every 2 years from ages 90 through 51. Every 3 years starting at age 21 through age 56 or 3 with a history of 3 consecutive normal Pap tests.  HPV screening.** / Every 3 years from ages 24 through ages 1 to 46 with a history of 3 consecutive normal Pap tests.  Hepatitis C blood test.** / For any individual with known risks for hepatitis C.  Skin self-exam. / Monthly.  Influenza vaccine. / Every year.  Tetanus, diphtheria, and acellular pertussis (Tdap, Td) vaccine.** / Consult your health care provider. Pregnant women should receive 1 dose of Tdap vaccine during each pregnancy. 1 dose of Td every 10 years.  Varicella vaccine.** / Consult your health care provider. Pregnant females who do not have evidence of immunity should receive the first dose after pregnancy.  HPV vaccine. / 3 doses over 6 months, if 72 and younger. The vaccine is not recommended for use in pregnant females. However, pregnancy testing is not needed before receiving a dose.  Measles, mumps, rubella (MMR) vaccine.** / You need at least 1 dose of MMR if you were born in 1957 or later. You may also need a 2nd dose. For females of childbearing age, rubella immunity should be determined. If there is no evidence of immunity, females who are not pregnant should be vaccinated. If there is no evidence of immunity, females who are pregnant should delay immunization until after pregnancy.  Pneumococcal 13-valent conjugate (PCV13) vaccine.** / Consult your health care provider.  Pneumococcal polysaccharide (PPSV23) vaccine.** / 1 to 2 doses if you smoke cigarettes or if you have certain conditions.  Meningococcal vaccine.** /  1 dose if you are age 19 to 21 years and a first-year college  student living in a residence hall, or have one of several medical conditions, you need to get vaccinated against meningococcal disease. You may also need additional booster doses.  Hepatitis A vaccine.** / Consult your health care provider.  Hepatitis B vaccine.** / Consult your health care provider.  Haemophilus influenzae type b (Hib) vaccine.** / Consult your health care provider. Ages 40 to 64 years  Blood pressure check.** / Every 1 to 2 years.  Lipid and cholesterol check.** / Every 5 years beginning at age 20 years.  Lung cancer screening. / Every year if you are aged 55-80 years and have a 30-pack-year history of smoking and currently smoke or have quit within the past 15 years. Yearly screening is stopped once you have quit smoking for at least 15 years or develop a health problem that would prevent you from having lung cancer treatment.  Clinical breast exam.** / Every year after age 40 years.  BRCA-related cancer risk assessment.** / For women who have family members with a BRCA-related cancer (breast, ovarian, tubal, or peritoneal cancers).  Mammogram.** / Every year beginning at age 40 years and continuing for as long as you are in good health. Consult with your health care provider.  Pap test.** / Every 3 years starting at age 30 years through age 65 or 70 years with a history of 3 consecutive normal Pap tests.  HPV screening.** / Every 3 years from ages 30 years through ages 65 to 70 years with a history of 3 consecutive normal Pap tests.  Fecal occult blood test (FOBT) of stool. / Every year beginning at age 50 years and continuing until age 75 years. You may not need to do this test if you get a colonoscopy every 10 years.  Flexible sigmoidoscopy or colonoscopy.** / Every 5 years for a flexible sigmoidoscopy or every 10 years for a colonoscopy beginning at age 50 years and continuing until age 75 years.  Hepatitis C blood test.** / For all people born from 1945 through  1965 and any individual with known risks for hepatitis C.  Skin self-exam. / Monthly.  Influenza vaccine. / Every year.  Tetanus, diphtheria, and acellular pertussis (Tdap/Td) vaccine.** / Consult your health care provider. Pregnant women should receive 1 dose of Tdap vaccine during each pregnancy. 1 dose of Td every 10 years.  Varicella vaccine.** / Consult your health care provider. Pregnant females who do not have evidence of immunity should receive the first dose after pregnancy.  Zoster vaccine.** / 1 dose for adults aged 60 years or older.  Measles, mumps, rubella (MMR) vaccine.** / You need at least 1 dose of MMR if you were born in 1957 or later. You may also need a 2nd dose. For females of childbearing age, rubella immunity should be determined. If there is no evidence of immunity, females who are not pregnant should be vaccinated. If there is no evidence of immunity, females who are pregnant should delay immunization until after pregnancy.  Pneumococcal 13-valent conjugate (PCV13) vaccine.** / Consult your health care provider.  Pneumococcal polysaccharide (PPSV23) vaccine.** / 1 to 2 doses if you smoke cigarettes or if you have certain conditions.  Meningococcal vaccine.** / Consult your health care provider.  Hepatitis A vaccine.** / Consult your health care provider.  Hepatitis B vaccine.** / Consult your health care provider.  Haemophilus influenzae type b (Hib) vaccine.** / Consult your health care provider. Ages 65   years and over  Blood pressure check.** / Every 1 to 2 years.  Lipid and cholesterol check.** / Every 5 years beginning at age 22 years.  Lung cancer screening. / Every year if you are aged 73-80 years and have a 30-pack-year history of smoking and currently smoke or have quit within the past 15 years. Yearly screening is stopped once you have quit smoking for at least 15 years or develop a health problem that would prevent you from having lung cancer  treatment.  Clinical breast exam.** / Every year after age 4 years.  BRCA-related cancer risk assessment.** / For women who have family members with a BRCA-related cancer (breast, ovarian, tubal, or peritoneal cancers).  Mammogram.** / Every year beginning at age 40 years and continuing for as long as you are in good health. Consult with your health care provider.  Pap test.** / Every 3 years starting at age 9 years through age 34 or 91 years with 3 consecutive normal Pap tests. Testing can be stopped between 65 and 70 years with 3 consecutive normal Pap tests and no abnormal Pap or HPV tests in the past 10 years.  HPV screening.** / Every 3 years from ages 57 years through ages 64 or 45 years with a history of 3 consecutive normal Pap tests. Testing can be stopped between 65 and 70 years with 3 consecutive normal Pap tests and no abnormal Pap or HPV tests in the past 10 years.  Fecal occult blood test (FOBT) of stool. / Every year beginning at age 15 years and continuing until age 17 years. You may not need to do this test if you get a colonoscopy every 10 years.  Flexible sigmoidoscopy or colonoscopy.** / Every 5 years for a flexible sigmoidoscopy or every 10 years for a colonoscopy beginning at age 86 years and continuing until age 71 years.  Hepatitis C blood test.** / For all people born from [birth year] through 1965 and any individual with known risks for hepatitis C.  Osteoporosis screening.** / A one-time screening for women ages 83 years and over and women at risk for fractures or osteoporosis.  Skin self-exam. / Monthly.  Influenza vaccine. / Every year.  Tetanus, diphtheria, and acellular pertussis (Tdap/Td) vaccine.** / 1 dose of Td every 10 years.  Varicella vaccine.** / Consult your health care provider.  Zoster vaccine.** / 1 dose for adults aged 61 years or older.  Pneumococcal 13-valent conjugate (PCV13) vaccine.** / Consult your health care provider.  Pneumococcal  polysaccharide (PPSV23) vaccine.** / 1 dose for all adults aged 28 years and older.  Meningococcal vaccine.** / Consult your health care provider.  Hepatitis A vaccine.** / Consult your health care provider.  Hepatitis B vaccine.** / Consult your health care provider.  Haemophilus influenzae type b (Hib) vaccine.** / Consult your health care provider. ** Family history and personal history of risk and conditions may change your health care provider's recommendations. Document Released: 10/23/2001 Document Revised: 01/11/2014 Document Reviewed: 01/22/2011 [facility] Patient Information 2015 [place], [place]. This information is not intended to replace advice given to you by your health care provider. Make sure you discuss any questions you have with your health care provider.

## 2014-09-22 LAB — CBC
HCT: 46.3 % — ABNORMAL HIGH (ref 36.0–46.0)
Hemoglobin: 14.9 g/dL (ref 12.0–15.0)
MCHC: 32.2 g/dL (ref 30.0–36.0)
MCV: 89 fl (ref 78.0–100.0)
Platelets: 215 10*3/uL (ref 150.0–400.0)
RBC: 5.21 Mil/uL — ABNORMAL HIGH (ref 3.87–5.11)
RDW: 12.9 % (ref 11.5–15.5)
WBC: 9.7 10*3/uL (ref 4.0–10.5)

## 2014-09-22 LAB — URINALYSIS
Bilirubin Urine: NEGATIVE
Hgb urine dipstick: NEGATIVE
Leukocytes, UA: NEGATIVE
Nitrite: NEGATIVE
Specific Gravity, Urine: 1.01 (ref 1.000–1.030)
Total Protein, Urine: NEGATIVE
Urine Glucose: NEGATIVE
Urobilinogen, UA: 0.2 (ref 0.0–1.0)
pH: 6 (ref 5.0–8.0)

## 2014-09-22 LAB — URINE CULTURE
Colony Count: NO GROWTH
Organism ID, Bacteria: NO GROWTH

## 2014-09-23 ENCOUNTER — Telehealth: Payer: Self-pay | Admitting: Family Medicine

## 2014-09-23 LAB — COMPREHENSIVE METABOLIC PANEL
ALT: 27 U/L (ref 0–35)
AST: 30 U/L (ref 0–37)
Albumin: 4.7 g/dL (ref 3.5–5.2)
Alkaline Phosphatase: 73 U/L (ref 39–117)
BUN: 12 mg/dL (ref 6–23)
CO2: 20 mEq/L (ref 19–32)
Calcium: 9.2 mg/dL (ref 8.4–10.5)
Chloride: 108 mEq/L (ref 96–112)
Creatinine, Ser: 0.75 mg/dL (ref 0.40–1.20)
GFR: 80.48 mL/min (ref >60.00)
Glucose, Bld: 89 mg/dL (ref 70–99)
Potassium: 4.9 mEq/L (ref 3.5–5.1)
Sodium: 139 mEq/L (ref 135–145)
Total Bilirubin: 0.7 mg/dL (ref 0.2–1.2)
Total Protein: 7.3 g/dL (ref 6.0–8.3)

## 2014-09-23 LAB — LIPID PANEL
Cholesterol: 275 mg/dL — ABNORMAL HIGH (ref 0–200)
HDL: 48.4 mg/dL (ref >39.00)
LDL Cholesterol: 196 mg/dL — ABNORMAL HIGH (ref 0–99)
NonHDL: 226.6
Total CHOL/HDL Ratio: 6
Triglycerides: 154 mg/dL — ABNORMAL HIGH (ref 0.0–149.0)
VLDL: 30.8 mg/dL (ref 0.0–40.0)

## 2014-09-23 NOTE — Telephone Encounter (Signed)
Caller name: Dr. Guerry Bruin  Relation to pt: spouse  Call back number: [phone]   Reason for call:  Pt inquiring about urine results

## 2014-09-23 NOTE — Telephone Encounter (Signed)
Spoke with the pt and UC results given.  Pt verbalized understanding.//AB/CMA

## 2014-09-24 ENCOUNTER — Other Ambulatory Visit (INDEPENDENT_AMBULATORY_CARE_PROVIDER_SITE_OTHER): Payer: Medicare Other

## 2014-09-24 DIAGNOSIS — E785 Hyperlipidemia, unspecified: Secondary | ICD-10-CM

## 2014-09-24 DIAGNOSIS — R7989 Other specified abnormal findings of blood chemistry: Secondary | ICD-10-CM

## 2014-09-24 LAB — TSH: TSH: 3.53 u[IU]/mL (ref 0.35–4.50)

## 2014-09-26 DIAGNOSIS — R300 Dysuria: Secondary | ICD-10-CM | POA: Insufficient documentation

## 2014-09-26 DIAGNOSIS — Z0000 Encounter for general adult medical examination without abnormal findings: Secondary | ICD-10-CM | POA: Insufficient documentation

## 2014-09-26 NOTE — Assessment & Plan Note (Signed)
Not taking statins including Pitavastatin due to intolerance. Encouraged heart healthy diet, increase exercise, avoid trans fats, consider a krill oil cap daily

## 2014-09-26 NOTE — Assessment & Plan Note (Signed)
Patient denies any difficulties at home. No trouble with ADLs, depression or falls. No recent changes to vision or hearing. Is UTD with immunizations. Is UTD with screening. Discussed Advanced Directives, patient agrees to bring [place] copies of documents if can. Encouraged heart healthy diet, exercise as tolerated and adequate sleep. Follows with dermatology Follows with gastroenterology, Dr Deatra Ina, colonoscopy was in 2011, needs repeat in 5-10 years Followed with GYN Dr Milbert Coulter but he retired.  MGM in November of 2015 Follows with Dr Katy Fitch of opthamology Labs ordered and reviewed.

## 2014-09-26 NOTE — Assessment & Plan Note (Signed)
Improved on recheck and patient reports 135/83 at home prior to coming in. Well controlled, no changes to meds. Encouraged heart healthy diet such as the DASH diet and exercise as tolerated.

## 2014-09-26 NOTE — Assessment & Plan Note (Signed)
Encouraged DASH diet, decrease po intake and increase exercise as tolerated. Needs 7-8 hours of sleep nightly. Avoid trans fats, eat small, frequent meals every 4-5 hours with lean proteins, complex carbs and healthy fats. Minimize simple carbs, GMO foods. 

## 2014-09-26 NOTE — Assessment & Plan Note (Signed)
Improved on recheck.  

## 2014-09-27 ENCOUNTER — Encounter: Payer: Self-pay | Admitting: *Deleted

## 2015-02-08 ENCOUNTER — Telehealth: Payer: Self-pay | Admitting: Family Medicine

## 2015-02-08 NOTE — Telephone Encounter (Signed)
Relation to pt: self Call back number: [phone]   Reason for call:  Pt scheduled 6 month follow up for 04/04/2015 and would like labs done a week prior. Requesting orders please advise.

## 2015-02-10 NOTE — Telephone Encounter (Signed)
I will order let me know what she needs.

## 2015-02-10 NOTE — Telephone Encounter (Signed)
Lipid, cmp, cbc, tsh and vitamin d for hyperlipidemia, mixed, essential HTN and vitamin D deficiency

## 2015-02-11 ENCOUNTER — Telehealth: Payer: Self-pay | Admitting: Family Medicine

## 2015-02-11 DIAGNOSIS — E559 Vitamin D deficiency, unspecified: Secondary | ICD-10-CM

## 2015-02-11 DIAGNOSIS — I10 Essential (primary) hypertension: Secondary | ICD-10-CM

## 2015-02-11 DIAGNOSIS — E782 Mixed hyperlipidemia: Secondary | ICD-10-CM

## 2015-02-11 NOTE — Telephone Encounter (Signed)
Put order in for labs to be done 03/28/15 at 10 am one week before appt.  Per PCP instructions

## 2015-03-28 ENCOUNTER — Other Ambulatory Visit (INDEPENDENT_AMBULATORY_CARE_PROVIDER_SITE_OTHER): Payer: Medicare Other

## 2015-03-28 DIAGNOSIS — E782 Mixed hyperlipidemia: Secondary | ICD-10-CM | POA: Diagnosis not present

## 2015-03-28 DIAGNOSIS — E559 Vitamin D deficiency, unspecified: Secondary | ICD-10-CM | POA: Diagnosis not present

## 2015-03-28 DIAGNOSIS — I10 Essential (primary) hypertension: Secondary | ICD-10-CM

## 2015-03-28 LAB — COMPREHENSIVE METABOLIC PANEL
ALT: 21 U/L (ref 0–35)
AST: 21 U/L (ref 0–37)
Albumin: 4.5 g/dL (ref 3.5–5.2)
Alkaline Phosphatase: 60 U/L (ref 39–117)
BUN: 18 mg/dL (ref 6–23)
CO2: 26 mEq/L (ref 19–32)
Calcium: 9.3 mg/dL (ref 8.4–10.5)
Chloride: 108 mEq/L (ref 96–112)
Creatinine, Ser: 0.75 mg/dL (ref 0.40–1.20)
GFR: 80.37 mL/min (ref >60.00)
Glucose, Bld: 88 mg/dL (ref 70–99)
Potassium: 3.8 mEq/L (ref 3.5–5.1)
Sodium: 144 mEq/L (ref 135–145)
Total Bilirubin: 0.8 mg/dL (ref 0.2–1.2)
Total Protein: 7 g/dL (ref 6.0–8.3)

## 2015-03-28 LAB — CBC WITH DIFFERENTIAL/PLATELET
Basophils Absolute: 0.1 10*3/uL (ref 0.0–0.1)
Basophils Relative: 1 % (ref 0.0–3.0)
Eosinophils Absolute: 0.1 10*3/uL (ref 0.0–0.7)
Eosinophils Relative: 1.5 % (ref 0.0–5.0)
HCT: 42.3 % (ref 36.0–46.0)
Hemoglobin: 13.8 g/dL (ref 12.0–15.0)
Lymphocytes Relative: 29.5 % (ref 12.0–46.0)
Lymphs Abs: 1.7 10*3/uL (ref 0.7–4.0)
MCHC: 32.7 g/dL (ref 30.0–36.0)
MCV: 89.8 fl (ref 78.0–100.0)
Monocytes Absolute: 0.4 10*3/uL (ref 0.1–1.0)
Monocytes Relative: 7.2 % (ref 3.0–12.0)
Neutro Abs: 3.6 10*3/uL (ref 1.4–7.7)
Neutrophils Relative %: 60.8 % (ref 43.0–77.0)
Platelets: 248 10*3/uL (ref 150.0–400.0)
RBC: 4.71 Mil/uL (ref 3.87–5.11)
RDW: 13.7 % (ref 11.5–15.5)
WBC: 5.8 10*3/uL (ref 4.0–10.5)

## 2015-03-28 LAB — LIPID PANEL
Cholesterol: 256 mg/dL — ABNORMAL HIGH (ref 0–200)
HDL: 53 mg/dL (ref >39.00)
LDL Cholesterol: 180 mg/dL — ABNORMAL HIGH (ref 0–99)
NonHDL: 203
Total CHOL/HDL Ratio: 5
Triglycerides: 114 mg/dL (ref 0.0–149.0)
VLDL: 22.8 mg/dL (ref 0.0–40.0)

## 2015-03-28 LAB — TSH: TSH: 4.02 u[IU]/mL (ref 0.35–4.50)

## 2015-03-28 LAB — VITAMIN D 25 HYDROXY (VIT D DEFICIENCY, FRACTURES): VITD: 12.78 ng/mL — ABNORMAL LOW (ref 30.00–100.00)

## 2015-03-29 ENCOUNTER — Other Ambulatory Visit: Payer: Self-pay | Admitting: Family Medicine

## 2015-03-29 DIAGNOSIS — E559 Vitamin D deficiency, unspecified: Secondary | ICD-10-CM

## 2015-03-29 MED ORDER — ERGOCALCIFEROL 1.25 MG (50000 UT) PO CAPS: 50000.0000 [IU] | ORAL_CAPSULE | ORAL | Status: DC

## 2015-04-04 ENCOUNTER — Ambulatory Visit (INDEPENDENT_AMBULATORY_CARE_PROVIDER_SITE_OTHER): Payer: Medicare Other | Admitting: Family Medicine

## 2015-04-04 ENCOUNTER — Encounter: Payer: Self-pay | Admitting: Family Medicine

## 2015-04-04 VITALS — BP 142/82 | HR 102 | Temp 98.1°F | Resp 18 | Ht 63.0 in | Wt 174.4 lb

## 2015-04-04 DIAGNOSIS — E669 Obesity, unspecified: Secondary | ICD-10-CM | POA: Diagnosis not present

## 2015-04-04 DIAGNOSIS — E782 Mixed hyperlipidemia: Secondary | ICD-10-CM

## 2015-04-04 DIAGNOSIS — I10 Essential (primary) hypertension: Secondary | ICD-10-CM

## 2015-04-04 DIAGNOSIS — E559 Vitamin D deficiency, unspecified: Secondary | ICD-10-CM

## 2015-04-04 NOTE — Patient Instructions (Signed)
Needs 2 lab appts one for 3 months and another for 6 months  Cholesterol Cholesterol is a white, waxy, fat-like substance needed by your body in small amounts. The liver makes all the cholesterol you need. Cholesterol is carried from the liver by the blood through the blood vessels. Deposits of cholesterol (plaque) may build up on blood vessel walls. These make the arteries narrower and stiffer. Cholesterol plaques increase the risk for heart attack and stroke.  You cannot feel your cholesterol level even if it is very high. The only way to know it is high is with a blood test. Once you know your cholesterol levels, you should keep a record of the test results. Work with your health care provider to keep your levels in the desired range.  WHAT DO THE RESULTS MEAN?  Total cholesterol is a rough measure of all the cholesterol in your blood.   LDL is the so-called bad cholesterol. This is the type that deposits cholesterol in the walls of the arteries. You want this level to be low.   HDL is the good cholesterol because it cleans the arteries and carries the LDL away. You want this level to be high.  Triglycerides are fat that the body can either burn for energy or store. High levels are closely linked to heart disease.  WHAT ARE THE DESIRED LEVELS OF CHOLESTEROL?  Total cholesterol below 200.   LDL below 100 for people at risk, below 70 for those at very high risk.   HDL above 50 is good, above 60 is best.   Triglycerides below 150.  HOW CAN I LOWER MY CHOLESTEROL?  Diet. Follow your diet programs as directed by your health care provider.   Choose fish or white meat chicken and [place], roasted or baked. Limit fatty cuts of red meat, fried foods, and processed meats, such as sausage and lunch meats.   Eat lots of fresh fruits and vegetables.  Choose whole grains, beans, pasta, potatoes, and cereals.   Use only small amounts of olive, corn, or canola oils.   Avoid butter,  mayonnaise, shortening, or palm kernel oils.  Avoid foods with trans fats.   Drink skim or nonfat milk and eat low-fat or nonfat yogurt and cheeses. Avoid whole milk, cream, ice cream, egg yolks, and full-fat cheeses.   Healthy desserts include angel food cake, ginger snaps, animal crackers, hard candy, popsicles, and low-fat or nonfat frozen yogurt. Avoid pastries, cakes, pies, and cookies.   Exercise. Follow your exercise programs as directed by your health care provider.   A regular program helps decrease LDL and raise HDL.   A regular program helps with weight control.   Do things that increase your activity level like gardening, walking, or taking the stairs. Ask your health care provider about how you can be more active in your daily life.   Medicine. Take medicine only as directed by your health care provider.   Medicine may be prescribed by your health care provider to help lower cholesterol and decrease the risk for heart disease.   If you have several risk factors, you may need medicine even if your levels are normal. Document Released: 05/22/2001 Document Revised: 01/11/2014 Document Reviewed: 06/10/2013 [facility] Patient Information 2015 [place], [place]. This information is not intended to replace advice given to you by your health care provider. Make sure you discuss any questions you have with your health care provider.

## 2015-04-04 NOTE — Progress Notes (Signed)
[name]  [id number] 29-Oct-1940 04/04/2015      Progress Note-Follow Up  Subjective  Chief Complaint  Chief Complaint  Patient presents with  . Follow-up    HPI  Patient is a 74 y.o. female in today for routine medical care. She is in today for follow up continues to do well physically. She is struggling with caring for her husband who is in ailing health  She struggles with anxiety but feels she is doing well. Denies CP/palp/SOB/HA/congestion/fevers/GI or GU c/o. Taking meds as prescribed  Past Medical History  Diagnosis Date  . Hyperlipidemia   . Cancer   . Arthritis   . 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 Dyslipidemia   . Obesity   . Glaucoma 11/02/2012  . HTN (hypertension) 11/02/2012  . Abnormal thyroid function test 07/01/2013  . Dermatitis 11/25/2013    Past Surgical History  Procedure Laterality Date  . Breast surgery  February 2007    Left Mastectomy  . Dilation and curettage of uterus    . Excision morton's neuroma      right foot    Family History  Problem Relation Age of Onset  . Pneumonia Mother   . Hyperlipidemia Mother   . Parkinsonism Mother   . Other Father     bilateral subdural hematoma  . Colon cancer Paternal Grandmother   . Prostate cancer Neg Hx   . Breast cancer Neg Hx   . Heart disease Neg Hx   . Diabetes Neg Hx     History   Social History  . Marital Status: Married    Spouse Name: N/A  . Number of Children: N/A  . Years of Education: N/A   Occupational History  . Not on file.   Social History Main Topics  . Smoking status: Never Smoker   . Smokeless tobacco: Never Used  . Alcohol Use: No  . Drug Use: No  . Sexual Activity: Not on file   Other Topics Concern  . Not on file   Social History Narrative    Current Outpatient Prescriptions on File Prior to Visit  Medication Sig Dispense Refill  . B Complex Vitamins (B COMPLEX 100 PO) Take 100 mg by mouth daily.      . Cholecalciferol (VITAMIN D-3) 5000 UNITS TABS Take 5,000 mg by mouth daily.    . Coenzyme Q10 (COQ10) 200 MG CAPS Take 1 capsule by mouth daily.    . dorzolamide-timolol (COSOPT) 22.3-6.8 MG/ML ophthalmic solution Place 1 drop into the right eye 2 (two) times daily.    . ergocalciferol (VITAMIN D2) 50000 UNITS capsule Take 1 capsule (50,000 Units total) by mouth once a week. 4 capsule 4  . Krill Oil 1000 MG CAPS Take by mouth. Mega Red    . latanoprost (XALATAN) 0.005 % ophthalmic solution Place 1 drop into the right eye at bedtime.    . Pitavastatin Calcium (LIVALO) 2 MG TABS Take 1 tablet (2 mg total) by mouth daily. 1/2 tab daily, if tolerated increase to 1 tab daily 30 tablet 2  . Probiotic Product (PROBIOTIC DAILY) CAPS Take by mouth daily.     Marland Kitchen UNABLE TO FIND RX: [id number] post surgical bras qty: 6        H4193 silicone breast prosthesis qty: 1 DX: 174.9 left mastectomy 1 each 0   No current facility-administered medications on file prior to visit.    Allergies  Allergen Reactions  . Livalo [  Pitavastatin]     ENTERED IN ERROR  . Rosuvastatin Other (See Comments)    Joint & muscle aches, sleep issues.  . Adhesive [Tape] Other (See Comments)    Causes skin to turn red where touched    Review of Systems  Review of Systems  Constitutional: Negative for fever and malaise/fatigue.  HENT: Negative for congestion.   Eyes: Negative for discharge.  Respiratory: Negative for shortness of breath.   Cardiovascular: Negative for chest pain, palpitations and leg swelling.  Gastrointestinal: Negative for nausea, abdominal pain and diarrhea.  Genitourinary: Negative for dysuria.  Musculoskeletal: Negative for falls.  Skin: Negative for rash.  Neurological: Negative for loss of consciousness and headaches.  Endo/Heme/Allergies: Negative for polydipsia.  Psychiatric/Behavioral: Negative for depression and suicidal ideas. The patient is nervous/anxious. The patient does not have insomnia.      Objective  BP 142/82 mmHg  Pulse 102  Temp(Src) 98.1 F (36.7 C) (Oral)  Resp 18  Ht 5\' 3"  (1.6 m)  Wt 174 lb 6.4 oz (79.107 kg)  BMI 30.90 kg/m2  SpO2 95%  Physical Exam  Physical Exam  Constitutional: She is oriented to person, place, and time and well-developed, well-nourished, and in no distress. No distress.  HENT:  Head: Normocephalic and atraumatic.  Eyes: Conjunctivae are normal.  Neck: Neck supple. No thyromegaly present.  Cardiovascular: Normal rate, regular rhythm and normal heart sounds.   No murmur heard. Pulmonary/Chest: Effort normal and breath sounds normal. She has no wheezes.  Abdominal: She exhibits no distension and no mass.  Musculoskeletal: She exhibits no edema.  Lymphadenopathy:    She has no cervical adenopathy.  Neurological: She is alert and oriented to person, place, and time.  Skin: Skin is warm and dry. No rash noted. She is not diaphoretic.  Psychiatric: Memory, affect and judgment normal.    Lab Results  Component Value Date   TSH 4.02 03/28/2015   Lab Results  Component Value Date   WBC 5.8 03/28/2015   HGB 13.8 03/28/2015   HCT 42.3 03/28/2015   MCV 89.8 03/28/2015   PLT 248.0 03/28/2015   Lab Results  Component Value Date   CREATININE 0.75 03/28/2015   BUN 18 03/28/2015   NA 144 03/28/2015   K 3.8 03/28/2015   CL 108 03/28/2015   CO2 26 03/28/2015   Lab Results  Component Value Date   ALT 21 03/28/2015   AST 21 03/28/2015   ALKPHOS 60 03/28/2015   BILITOT 0.8 03/28/2015   Lab Results  Component Value Date   CHOL 256* 03/28/2015   Lab Results  Component Value Date   HDL 53.00 03/28/2015   Lab Results  Component Value Date   LDLCALC 180* 03/28/2015   Lab Results  Component Value Date   TRIG 114.0 03/28/2015   Lab Results  Component Value Date   CHOLHDL 5 03/28/2015     Assessment & Plan  Vitamin D deficiency Was already on Vitamin D 5000 IU daily when we checked her levels and it was low. Has  started the 50000 IU caps weekly and that has been helpful. Recheck in 12 weeks  Obesity (BMI 30-39.9) Encouraged DASH diet, decrease po intake and increase exercise as tolerated. Needs 7-8 hours of sleep nightly. Avoid trans fats, eat small, frequent meals every 4-5 hours with lean proteins, complex carbs and healthy fats. Minimize simple carbs  HTN (hypertension) Well controlled, no changes to meds. Encouraged heart healthy diet such as the DASH diet and exercise as  tolerated. Improved on recheck  Hyperlipidemia, mixed Does not tolerate statins, doing well. Encouraged heart healthy diet, increase exercise, avoid trans fats, consider a krill oil cap daily, continue current diet

## 2015-04-04 NOTE — Assessment & Plan Note (Signed)
Encouraged DASH diet, decrease po intake and increase exercise as tolerated. Needs 7-8 hours of sleep nightly. Avoid trans fats, eat small, frequent meals every 4-5 hours with lean proteins, complex carbs and healthy fats. Minimize simple carbs 

## 2015-04-04 NOTE — Progress Notes (Signed)
Pre visit review using our clinic review tool, if applicable. No additional management support is needed unless otherwise documented below in the visit note. 

## 2015-04-04 NOTE — Assessment & Plan Note (Addendum)
Well controlled, no changes to meds. Encouraged heart healthy diet such as the DASH diet and exercise as tolerated. Improved on recheck 

## 2015-04-04 NOTE — Assessment & Plan Note (Signed)
Was already on Vitamin D 5000 IU daily when we checked her levels and it was low. Has started the 50000 IU caps weekly and that has been helpful. Recheck in 12 weeks

## 2015-04-04 NOTE — Assessment & Plan Note (Addendum)
Does not tolerate statins, doing well. Encouraged heart healthy diet, increase exercise, avoid trans fats, consider a krill oil cap daily, continue current diet

## 2015-07-04 ENCOUNTER — Other Ambulatory Visit (INDEPENDENT_AMBULATORY_CARE_PROVIDER_SITE_OTHER): Payer: Medicare Other

## 2015-07-04 DIAGNOSIS — E559 Vitamin D deficiency, unspecified: Secondary | ICD-10-CM | POA: Diagnosis not present

## 2015-07-04 LAB — VITAMIN D 25 HYDROXY (VIT D DEFICIENCY, FRACTURES): VITD: 21.4 ng/mL — ABNORMAL LOW (ref 30.00–100.00)

## 2015-07-05 ENCOUNTER — Ambulatory Visit (INDEPENDENT_AMBULATORY_CARE_PROVIDER_SITE_OTHER): Payer: Medicare Other | Admitting: Behavioral Health

## 2015-07-05 DIAGNOSIS — Z23 Encounter for immunization: Secondary | ICD-10-CM

## 2015-07-05 NOTE — Progress Notes (Signed)
Pre visit review using our clinic review tool, if applicable. No additional management support is needed unless otherwise documented below in the visit note. 

## 2015-07-25 ENCOUNTER — Other Ambulatory Visit: Payer: Self-pay

## 2015-07-25 DIAGNOSIS — Z1231 Encounter for screening mammogram for malignant neoplasm of breast: Secondary | ICD-10-CM

## 2015-08-25 ENCOUNTER — Ambulatory Visit
Admission: RE | Admit: 2015-08-25 | Discharge: 2015-08-25 | Disposition: A | Discharge status: Home / Self Care | Payer: Medicare Other | Source: Ambulatory Visit

## 2015-08-25 DIAGNOSIS — Z1231 Encounter for screening mammogram for malignant neoplasm of breast: Secondary | ICD-10-CM

## 2015-09-30 ENCOUNTER — Other Ambulatory Visit (INDEPENDENT_AMBULATORY_CARE_PROVIDER_SITE_OTHER): Payer: Medicare Other

## 2015-09-30 DIAGNOSIS — I10 Essential (primary) hypertension: Secondary | ICD-10-CM

## 2015-09-30 DIAGNOSIS — E559 Vitamin D deficiency, unspecified: Secondary | ICD-10-CM

## 2015-09-30 DIAGNOSIS — E782 Mixed hyperlipidemia: Secondary | ICD-10-CM

## 2015-09-30 DIAGNOSIS — E669 Obesity, unspecified: Secondary | ICD-10-CM

## 2015-09-30 LAB — VITAMIN D 25 HYDROXY (VIT D DEFICIENCY, FRACTURES): VITD: 12.66 ng/mL — ABNORMAL LOW (ref 30.00–100.00)

## 2015-09-30 LAB — COMPREHENSIVE METABOLIC PANEL
ALT: 17 U/L (ref 0–35)
AST: 15 U/L (ref 0–37)
Albumin: 4.1 g/dL (ref 3.5–5.2)
Alkaline Phosphatase: 57 U/L (ref 39–117)
BUN: 19 mg/dL (ref 6–23)
CO2: 26 mEq/L (ref 19–32)
Calcium: 8.6 mg/dL (ref 8.4–10.5)
Chloride: 107 mEq/L (ref 96–112)
Creatinine, Ser: 0.65 mg/dL (ref 0.40–1.20)
GFR: 94.66 mL/min (ref >60.00)
Glucose, Bld: 76 mg/dL (ref 70–99)
Potassium: 3.6 mEq/L (ref 3.5–5.1)
Sodium: 142 mEq/L (ref 135–145)
Total Bilirubin: 0.8 mg/dL (ref 0.2–1.2)
Total Protein: 6.6 g/dL (ref 6.0–8.3)

## 2015-09-30 LAB — LIPID PANEL
Cholesterol: 266 mg/dL — ABNORMAL HIGH (ref 0–200)
HDL: 45.3 mg/dL (ref >39.00)
LDL Cholesterol: 198 mg/dL — ABNORMAL HIGH (ref 0–99)
NonHDL: 221.17
Total CHOL/HDL Ratio: 6
Triglycerides: 115 mg/dL (ref 0.0–149.0)
VLDL: 23 mg/dL (ref 0.0–40.0)

## 2015-09-30 LAB — CBC
HCT: 41 % (ref 36.0–46.0)
Hemoglobin: 13.4 g/dL (ref 12.0–15.0)
MCHC: 32.6 g/dL (ref 30.0–36.0)
MCV: 88.7 fl (ref 78.0–100.0)
Platelets: 236 10*3/uL (ref 150.0–400.0)
RBC: 4.62 Mil/uL (ref 3.87–5.11)
RDW: 12.9 % (ref 11.5–15.5)
WBC: 5.4 10*3/uL (ref 4.0–10.5)

## 2015-09-30 LAB — TSH: TSH: 3.39 u[IU]/mL (ref 0.35–4.50)

## 2015-10-07 ENCOUNTER — Encounter: Payer: Self-pay | Admitting: Behavioral Health

## 2015-10-07 ENCOUNTER — Telehealth: Payer: Self-pay | Admitting: Behavioral Health

## 2015-10-07 ENCOUNTER — Ambulatory Visit: Payer: Medicare Other | Admitting: Family Medicine

## 2015-10-07 NOTE — Telephone Encounter (Signed)
Pre-Visit Call completed with patient and chart updated.   Pre-Visit Info documented in Specialty Comments under [employer].    

## 2015-10-10 ENCOUNTER — Encounter: Payer: Self-pay | Admitting: Family Medicine

## 2015-10-10 ENCOUNTER — Ambulatory Visit (INDEPENDENT_AMBULATORY_CARE_PROVIDER_SITE_OTHER): Payer: Medicare Other | Admitting: Family Medicine

## 2015-10-10 VITALS — BP 136/82 | HR 93 | Temp 98.4°F | Ht 63.0 in | Wt 173.5 lb

## 2015-10-10 DIAGNOSIS — Z0000 Encounter for general adult medical examination without abnormal findings: Secondary | ICD-10-CM

## 2015-10-10 DIAGNOSIS — I10 Essential (primary) hypertension: Secondary | ICD-10-CM | POA: Diagnosis not present

## 2015-10-10 DIAGNOSIS — E782 Mixed hyperlipidemia: Secondary | ICD-10-CM

## 2015-10-10 DIAGNOSIS — E559 Vitamin D deficiency, unspecified: Secondary | ICD-10-CM | POA: Diagnosis not present

## 2015-10-10 DIAGNOSIS — E785 Hyperlipidemia, unspecified: Secondary | ICD-10-CM

## 2015-10-10 DIAGNOSIS — E669 Obesity, unspecified: Secondary | ICD-10-CM | POA: Diagnosis not present

## 2015-10-10 NOTE — Patient Instructions (Addendum)
Probiotics and fiber supplement, benefiber or metamucil Red Yeast Rice such as Cholestoff, by [employer]  Co Q 10 daily Krill or fish oil   Preventive Care for Adults, Female A healthy lifestyle and preventive care can promote health and wellness. Preventive health guidelines for women include the following key practices.  A routine yearly physical is a good way to check with your health care provider about your health and preventive screening. It is a chance to share any concerns and updates on your health and to receive a thorough exam.  Visit your dentist for a routine exam and preventive care every 6 months. Brush your teeth twice a day and floss once a day. Good oral hygiene prevents tooth decay and gum disease.  The frequency of eye exams is based on your age, health, family medical history, use of contact lenses, and other factors. Follow your health care provider's recommendations for frequency of eye exams.  Eat a healthy diet. Foods like vegetables, fruits, whole grains, low-fat dairy products, and lean protein foods contain the nutrients you need without too many calories. Decrease your intake of foods high in solid fats, added sugars, and salt. Eat the right amount of calories for you.Get information about a proper diet from your health care provider, if necessary.  Regular physical exercise is one of the most important things you can do for your health. Most adults should get at least 150 minutes of moderate-intensity exercise (any activity that increases your heart rate and causes you to sweat) each week. In addition, most adults need muscle-strengthening exercises on 2 or more days a week.  Maintain a healthy weight. The body mass index (BMI) is a screening tool to identify possible weight problems. It provides an estimate of body fat based on height and weight. Your health care provider can find your BMI and can help you achieve or maintain a healthy weight.For adults 20 years and  older:  A BMI below 18.5 is considered underweight.  A BMI of 18.5 to 24.9 is normal.  A BMI of 25 to 29.9 is considered overweight.  A BMI of 30 and above is considered obese.  Maintain normal blood lipids and cholesterol levels by exercising and minimizing your intake of saturated fat. Eat a balanced diet with plenty of fruit and vegetables. Blood tests for lipids and cholesterol should begin at age 75 and be repeated every 5 years. If your lipid or cholesterol levels are high, you are over 50, or you are at high risk for heart disease, you may need your cholesterol levels checked more frequently.Ongoing high lipid and cholesterol levels should be treated with medicines if diet and exercise are not working.  If you smoke, find out from your health care provider how to quit. If you do not use tobacco, do not start.  Lung cancer screening is recommended for adults aged 55-80 years who are at high risk for developing lung cancer because of a history of smoking. A yearly low-dose CT scan of the lungs is recommended for people who have at least a 30-pack-year history of smoking and are a current smoker or have quit within the past 15 years. A pack year of smoking is smoking an average of 1 pack of cigarettes a day for 1 year (for example: 1 pack a day for 30 years or 2 packs a day for 15 years). Yearly screening should continue until the smoker has stopped smoking for at least 15 years. Yearly screening should be stopped for people  who develop a health problem that would prevent them from having lung cancer treatment.  If you are pregnant, do not drink alcohol. If you are breastfeeding, be very cautious about drinking alcohol. If you are not pregnant and choose to drink alcohol, do not have more than 1 drink per day. One drink is considered to be 12 ounces (355 mL) of beer, 5 ounces (148 mL) of wine, or 1.5 ounces (44 mL) of liquor.  Avoid use of street drugs. Do not share needles with anyone. Ask  for help if you need support or instructions about stopping the use of drugs.  High blood pressure causes heart disease and increases the risk of stroke. Your blood pressure should be checked at least every 1 to 2 years. Ongoing high blood pressure should be treated with medicines if weight loss and exercise do not work.  If you are 74-66 years old, ask your health care provider if you should take aspirin to prevent strokes.  Diabetes screening is done by taking a blood sample to check your blood glucose level after you have not eaten for a certain period of time (fasting). If you are not overweight and you do not have risk factors for diabetes, you should be screened once every 3 years starting at age 38. If you are overweight or obese and you are 87-100 years of age, you should be screened for diabetes every year as part of your cardiovascular risk assessment.  Breast cancer screening is essential preventive care for women. You should practice "breast self-awareness." This means understanding the normal appearance and feel of your breasts and may include breast self-examination. Any changes detected, no matter how small, should be reported to a health care provider. Women in their 51s and 30s should have a clinical breast exam (CBE) by a health care provider as part of a regular health exam every 1 to 3 years. After age 28, women should have a CBE every year. Starting at age 31, women should consider having a mammogram (breast X-ray test) every year. Women who have a family history of breast cancer should talk to their health care provider about genetic screening. Women at a high risk of breast cancer should talk to their health care providers about having an MRI and a mammogram every year.  Breast cancer gene (BRCA)-related cancer risk assessment is recommended for women who have family members with BRCA-related cancers. BRCA-related cancers include breast, ovarian, tubal, and peritoneal cancers. Having  family members with these cancers may be associated with an increased risk for harmful changes (mutations) in the breast cancer genes BRCA1 and BRCA2. Results of the assessment will determine the need for genetic counseling and BRCA1 and BRCA2 testing.  Your health care provider may recommend that you be screened regularly for cancer of the pelvic organs (ovaries, uterus, and vagina). This screening involves a pelvic examination, including checking for microscopic changes to the surface of your cervix (Pap test). You may be encouraged to have this screening done every 3 years, beginning at age 35.  For women ages 106-65, health care providers may recommend pelvic exams and Pap testing every 3 years, or they may recommend the Pap and pelvic exam, combined with testing for human papilloma virus (HPV), every 5 years. Some types of HPV increase your risk of cervical cancer. Testing for HPV may also be done on women of any age with unclear Pap test results.  Other health care providers may not recommend any screening for nonpregnant women who  are considered low risk for pelvic cancer and who do not have symptoms. Ask your health care provider if a screening pelvic exam is right for you.  If you have had past treatment for cervical cancer or a condition that could lead to cancer, you need Pap tests and screening for cancer for at least 20 years after your treatment. If Pap tests have been discontinued, your risk factors (such as having a new sexual partner) need to be reassessed to determine if screening should resume. Some women have medical problems that increase the chance of getting cervical cancer. In these cases, your health care provider may recommend more frequent screening and Pap tests.  Colorectal cancer can be detected and often prevented. Most routine colorectal cancer screening begins at the age of 73 years and continues through age 81 years. However, your health care provider may recommend  screening at an earlier age if you have risk factors for colon cancer. On a yearly basis, your health care provider may provide home test kits to check for hidden blood in the stool. Use of a small camera at the end of a tube, to directly examine the colon (sigmoidoscopy or colonoscopy), can detect the earliest forms of colorectal cancer. Talk to your health care provider about this at age 72, when routine screening begins. Direct exam of the colon should be repeated every 5-10 years through age 14 years, unless early forms of precancerous polyps or small growths are found.  People who are at an increased risk for hepatitis B should be screened for this virus. You are considered at high risk for hepatitis B if:  You were born in a country where hepatitis B occurs often. Talk with your health care provider about which countries are considered high risk.  Your parents were born in a high-risk country and you have not received a shot to protect against hepatitis B (hepatitis B vaccine).  You have HIV or AIDS.  You use needles to inject street drugs.  You live with, or have sex with, someone who has hepatitis B.  You get hemodialysis treatment.  You take certain medicines for conditions like cancer, organ transplantation, and autoimmune conditions.  Hepatitis C blood testing is recommended for all people born from [birth year] through 1965 and any individual with known risks for hepatitis C.  Practice safe sex. Use condoms and avoid high-risk sexual practices to reduce the spread of sexually transmitted infections (STIs). STIs include gonorrhea, chlamydia, syphilis, trichomonas, herpes, HPV, and human immunodeficiency virus (HIV). Herpes, HIV, and HPV are viral illnesses that have no cure. They can result in disability, cancer, and death.  You should be screened for sexually transmitted illnesses (STIs) including gonorrhea and chlamydia if:  You are sexually active and are younger than 24 years.  You  are older than 24 years and your health care provider tells you that you are at risk for this type of infection.  Your sexual activity has changed since you were last screened and you are at an increased risk for chlamydia or gonorrhea. Ask your health care provider if you are at risk.  If you are at risk of being infected with HIV, it is recommended that you take a prescription medicine daily to prevent HIV infection. This is called preexposure prophylaxis (PrEP). You are considered at risk if:  You are sexually active and do not regularly use condoms or know the HIV status of your partner(s).  You take drugs by injection.  You are sexually active  with a partner who has HIV.  Talk with your health care provider about whether you are at high risk of being infected with HIV. If you choose to begin PrEP, you should first be tested for HIV. You should then be tested every 3 months for as long as you are taking PrEP.  Osteoporosis is a disease in which the bones lose minerals and strength with aging. This can result in serious bone fractures or breaks. The risk of osteoporosis can be identified using a bone density scan. Women ages 41 years and over and women at risk for fractures or osteoporosis should discuss screening with their health care providers. Ask your health care provider whether you should take a calcium supplement or vitamin D to reduce the rate of osteoporosis.  Menopause can be associated with physical symptoms and risks. Hormone replacement therapy is available to decrease symptoms and risks. You should talk to your health care provider about whether hormone replacement therapy is right for you.  Use sunscreen. Apply sunscreen liberally and repeatedly throughout the day. You should seek shade when your shadow is shorter than you. Protect yourself by wearing long sleeves, pants, a wide-brimmed hat, and sunglasses year round, whenever you are outdoors.  Once a month, do a whole body  skin exam, using a mirror to look at the skin on your back. Tell your health care provider of new moles, moles that have irregular borders, moles that are larger than a pencil eraser, or moles that have changed in shape or color.  Stay current with required vaccines (immunizations).  Influenza vaccine. All adults should be immunized every year.  Tetanus, diphtheria, and acellular pertussis (Td, Tdap) vaccine. Pregnant women should receive 1 dose of Tdap vaccine during each pregnancy. The dose should be obtained regardless of the length of time since the last dose. Immunization is preferred during the 27th-36th week of gestation. An adult who has not previously received Tdap or who does not know her vaccine status should receive 1 dose of Tdap. This initial dose should be followed by tetanus and diphtheria toxoids (Td) booster doses every 10 years. Adults with an unknown or incomplete history of completing a 3-dose immunization series with Td-containing vaccines should begin or complete a primary immunization series including a Tdap dose. Adults should receive a Td booster every 10 years.  Varicella vaccine. An adult without evidence of immunity to varicella should receive 2 doses or a second dose if she has previously received 1 dose. Pregnant females who do not have evidence of immunity should receive the first dose after pregnancy. This first dose should be obtained before leaving the health care facility. The second dose should be obtained 4-8 weeks after the first dose.  Human papillomavirus (HPV) vaccine. Females aged 13-26 years who have not received the vaccine previously should obtain the 3-dose series. The vaccine is not recommended for use in pregnant females. However, pregnancy testing is not needed before receiving a dose. If a female is found to be pregnant after receiving a dose, no treatment is needed. In that case, the remaining doses should be delayed until after the pregnancy.  Immunization is recommended for any person with an immunocompromised condition through the age of 22 years if she did not get any or all doses earlier. During the 3-dose series, the second dose should be obtained 4-8 weeks after the first dose. The third dose should be obtained 24 weeks after the first dose and 16 weeks after the second dose.  Zoster  vaccine. One dose is recommended for adults aged 71 years or older unless certain conditions are present.  Measles, mumps, and rubella (MMR) vaccine. Adults born before 50 generally are considered immune to measles and mumps. Adults born in [birth year] or later should have 1 or more doses of MMR vaccine unless there is a contraindication to the vaccine or there is laboratory evidence of immunity to each of the three diseases. A routine second dose of MMR vaccine should be obtained at least 28 days after the first dose for students attending postsecondary schools, health care workers, or international travelers. People who received inactivated measles vaccine or an unknown type of measles vaccine during 1963-1967 should receive 2 doses of MMR vaccine. People who received inactivated mumps vaccine or an unknown type of mumps vaccine before 1979 and are at high risk for mumps infection should consider immunization with 2 doses of MMR vaccine. For females of childbearing age, rubella immunity should be determined. If there is no evidence of immunity, females who are not pregnant should be vaccinated. If there is no evidence of immunity, females who are pregnant should delay immunization until after pregnancy. Unvaccinated health care workers born before [birth year] who lack laboratory evidence of measles, mumps, or rubella immunity or laboratory confirmation of disease should consider measles and mumps immunization with 2 doses of MMR vaccine or rubella immunization with 1 dose of MMR vaccine.  Pneumococcal 13-valent conjugate (PCV13) vaccine. When indicated, a person who is  uncertain of his immunization history and has no record of immunization should receive the PCV13 vaccine. All adults 74 years of age and older should receive this vaccine. An adult aged 75 years or older who has certain medical conditions and has not been previously immunized should receive 1 dose of PCV13 vaccine. This PCV13 should be followed with a dose of pneumococcal polysaccharide (PPSV23) vaccine. Adults who are at high risk for pneumococcal disease should obtain the PPSV23 vaccine at least 8 weeks after the dose of PCV13 vaccine. Adults older than 74 years of age who have normal immune system function should obtain the PPSV23 vaccine dose at least 1 year after the dose of PCV13 vaccine.  Pneumococcal polysaccharide (PPSV23) vaccine. When PCV13 is also indicated, PCV13 should be obtained first. All adults aged 64 years and older should be immunized. An adult younger than age 44 years who has certain medical conditions should be immunized. Any person who resides in a nursing home or long-term care facility should be immunized. An adult smoker should be immunized. People with an immunocompromised condition and certain other conditions should receive both PCV13 and PPSV23 vaccines. People with human immunodeficiency virus (HIV) infection should be immunized as soon as possible after diagnosis. Immunization during chemotherapy or radiation therapy should be avoided. Routine use of PPSV23 vaccine is not recommended for American Indians, [place] Natives, or people younger than 65 years unless there are medical conditions that require PPSV23 vaccine. When indicated, people who have unknown immunization and have no record of immunization should receive PPSV23 vaccine. One-time revaccination 5 years after the first dose of PPSV23 is recommended for people aged 19-64 years who have chronic kidney failure, nephrotic syndrome, asplenia, or immunocompromised conditions. People who received 1-2 doses of PPSV23 before age  69 years should receive another dose of PPSV23 vaccine at age 50 years or later if at least 5 years have passed since the previous dose. Doses of PPSV23 are not needed for people immunized with PPSV23 at or after age 63 years.  Meningococcal vaccine. Adults with asplenia or persistent complement component deficiencies should receive 2 doses of quadrivalent meningococcal conjugate (MenACWY-D) vaccine. The doses should be obtained at least 2 months apart. Microbiologists working with certain meningococcal bacteria, [employer] recruits, people at risk during an outbreak, and people who travel to or live in countries with a high rate of meningitis should be immunized. A first-year college student up through age 51 years who is living in a residence hall should receive a dose if she did not receive a dose on or after her 16th birthday. Adults who have certain high-risk conditions should receive one or more doses of vaccine.  Hepatitis A vaccine. Adults who wish to be protected from this disease, have certain high-risk conditions, work with hepatitis A-infected animals, work in hepatitis A research labs, or travel to or work in countries with a high rate of hepatitis A should be immunized. Adults who were previously unvaccinated and who anticipate close contact with an international adoptee during the first 60 days after arrival in the [place] from a country with a high rate of hepatitis A should be immunized.  Hepatitis B vaccine. Adults who wish to be protected from this disease, have certain high-risk conditions, may be exposed to blood or other infectious body fluids, are household contacts or sex partners of hepatitis B positive people, are clients or workers in certain care facilities, or travel to or work in countries with a high rate of hepatitis B should be immunized.  Haemophilus influenzae type b (Hib) vaccine. A previously unvaccinated person with asplenia or sickle cell disease or having a  scheduled splenectomy should receive 1 dose of Hib vaccine. Regardless of previous immunization, a recipient of a hematopoietic stem cell transplant should receive a 3-dose series 6-12 months after her successful transplant. Hib vaccine is not recommended for adults with HIV infection. Preventive Services / Frequency Ages 53 to 62 years  Blood pressure check.** / Every 3-5 years.  Lipid and cholesterol check.** / Every 5 years beginning at age 71.  Clinical breast exam.** / Every 3 years for women in their 7s and 42s.  BRCA-related cancer risk assessment.** / For women who have family members with a BRCA-related cancer (breast, ovarian, tubal, or peritoneal cancers).  Pap test.** / Every 2 years from ages 6 through 56. Every 3 years starting at age 29 through age 64 or 79 with a history of 3 consecutive normal Pap tests.  HPV screening.** / Every 3 years from ages 66 through ages 23 to 40 with a history of 3 consecutive normal Pap tests.  Hepatitis C blood test.** / For any individual with known risks for hepatitis C.  Skin self-exam. / Monthly.  Influenza vaccine. / Every year.  Tetanus, diphtheria, and acellular pertussis (Tdap, Td) vaccine.** / Consult your health care provider. Pregnant women should receive 1 dose of Tdap vaccine during each pregnancy. 1 dose of Td every 10 years.  Varicella vaccine.** / Consult your health care provider. Pregnant females who do not have evidence of immunity should receive the first dose after pregnancy.  HPV vaccine. / 3 doses over 6 months, if 16 and younger. The vaccine is not recommended for use in pregnant females. However, pregnancy testing is not needed before receiving a dose.  Measles, mumps, rubella (MMR) vaccine.** / You need at least 1 dose of MMR if you were born in 1957 or later. You may also need a 2nd dose. For females of childbearing age, rubella immunity should be determined.  If there is no evidence of immunity, females who are not  pregnant should be vaccinated. If there is no evidence of immunity, females who are pregnant should delay immunization until after pregnancy.  Pneumococcal 13-valent conjugate (PCV13) vaccine.** / Consult your health care provider.  Pneumococcal polysaccharide (PPSV23) vaccine.** / 1 to 2 doses if you smoke cigarettes or if you have certain conditions.  Meningococcal vaccine.** / 1 dose if you are age 46 to 66 years and a Market researcher living in a residence hall, or have one of several medical conditions, you need to get vaccinated against meningococcal disease. You may also need additional booster doses.  Hepatitis A vaccine.** / Consult your health care provider.  Hepatitis B vaccine.** / Consult your health care provider.  Haemophilus influenzae type b (Hib) vaccine.** / Consult your health care provider. Ages 89 to 85 years  Blood pressure check.** / Every year.  Lipid and cholesterol check.** / Every 5 years beginning at age 30 years.  Lung cancer screening. / Every year if you are aged 9-80 years and have a 30-pack-year history of smoking and currently smoke or have quit within the past 15 years. Yearly screening is stopped once you have quit smoking for at least 15 years or develop a health problem that would prevent you from having lung cancer treatment.  Clinical breast exam.** / Every year after age 52 years.  BRCA-related cancer risk assessment.** / For women who have family members with a BRCA-related cancer (breast, ovarian, tubal, or peritoneal cancers).  Mammogram.** / Every year beginning at age 33 years and continuing for as long as you are in good health. Consult with your health care provider.  Pap test.** / Every 3 years starting at age 74 years through age 81 or 3 years with a history of 3 consecutive normal Pap tests.  HPV screening.** / Every 3 years from ages 64 years through ages 55 to 12 years with a history of 3 consecutive normal Pap  tests.  Fecal occult blood test (FOBT) of stool. / Every year beginning at age 72 years and continuing until age 38 years. You may not need to do this test if you get a colonoscopy every 10 years.  Flexible sigmoidoscopy or colonoscopy.** / Every 5 years for a flexible sigmoidoscopy or every 10 years for a colonoscopy beginning at age 40 years and continuing until age 77 years.  Hepatitis C blood test.** / For all people born from [birth year] through 1965 and any individual with known risks for hepatitis C.  Skin self-exam. / Monthly.  Influenza vaccine. / Every year.  Tetanus, diphtheria, and acellular pertussis (Tdap/Td) vaccine.** / Consult your health care provider. Pregnant women should receive 1 dose of Tdap vaccine during each pregnancy. 1 dose of Td every 10 years.  Varicella vaccine.** / Consult your health care provider. Pregnant females who do not have evidence of immunity should receive the first dose after pregnancy.  Zoster vaccine.** / 1 dose for adults aged 38 years or older.  Measles, mumps, rubella (MMR) vaccine.** / You need at least 1 dose of MMR if you were born in 1957 or later. You may also need a second dose. For females of childbearing age, rubella immunity should be determined. If there is no evidence of immunity, females who are not pregnant should be vaccinated. If there is no evidence of immunity, females who are pregnant should delay immunization until after pregnancy.  Pneumococcal 13-valent conjugate (PCV13) vaccine.** / Consult your health care  provider.  Pneumococcal polysaccharide (PPSV23) vaccine.** / 1 to 2 doses if you smoke cigarettes or if you have certain conditions.  Meningococcal vaccine.** / Consult your health care provider.  Hepatitis A vaccine.** / Consult your health care provider.  Hepatitis B vaccine.** / Consult your health care provider.  Haemophilus influenzae type b (Hib) vaccine.** / Consult your health care provider. Ages 43 years and  over  Blood pressure check.** / Every year.  Lipid and cholesterol check.** / Every 5 years beginning at age 6 years.  Lung cancer screening. / Every year if you are aged 34-80 years and have a 30-pack-year history of smoking and currently smoke or have quit within the past 15 years. Yearly screening is stopped once you have quit smoking for at least 15 years or develop a health problem that would prevent you from having lung cancer treatment.  Clinical breast exam.** / Every year after age 61 years.  BRCA-related cancer risk assessment.** / For women who have family members with a BRCA-related cancer (breast, ovarian, tubal, or peritoneal cancers).  Mammogram.** / Every year beginning at age 39 years and continuing for as long as you are in good health. Consult with your health care provider.  Pap test.** / Every 3 years starting at age 57 years through age 9 or 20 years with 3 consecutive normal Pap tests. Testing can be stopped between 65 and 70 years with 3 consecutive normal Pap tests and no abnormal Pap or HPV tests in the past 10 years.  HPV screening.** / Every 3 years from ages 58 years through ages 62 or 35 years with a history of 3 consecutive normal Pap tests. Testing can be stopped between 65 and 70 years with 3 consecutive normal Pap tests and no abnormal Pap or HPV tests in the past 10 years.  Fecal occult blood test (FOBT) of stool. / Every year beginning at age 64 years and continuing until age 59 years. You may not need to do this test if you get a colonoscopy every 10 years.  Flexible sigmoidoscopy or colonoscopy.** / Every 5 years for a flexible sigmoidoscopy or every 10 years for a colonoscopy beginning at age 28 years and continuing until age 83 years.  Hepatitis C blood test.** / For all people born from [birth year] through 1965 and any individual with known risks for hepatitis C.  Osteoporosis screening.** / A one-time screening for women ages 87 years and over and women  at risk for fractures or osteoporosis.  Skin self-exam. / Monthly.  Influenza vaccine. / Every year.  Tetanus, diphtheria, and acellular pertussis (Tdap/Td) vaccine.** / 1 dose of Td every 10 years.  Varicella vaccine.** / Consult your health care provider.  Zoster vaccine.** / 1 dose for adults aged 35 years or older.  Pneumococcal 13-valent conjugate (PCV13) vaccine.** / Consult your health care provider.  Pneumococcal polysaccharide (PPSV23) vaccine.** / 1 dose for all adults aged 75 years and older.  Meningococcal vaccine.** / Consult your health care provider.  Hepatitis A vaccine.** / Consult your health care provider.  Hepatitis B vaccine.** / Consult your health care provider.  Haemophilus influenzae type b (Hib) vaccine.** / Consult your health care provider. ** Family history and personal history of risk and conditions may change your health care provider's recommendations.   This information is not intended to replace advice given to you by your health care provider. Make sure you discuss any questions you have with your health care provider.   Document Released: 10/23/2001  Document Revised: 09/17/2014 Document Reviewed: 01/22/2011 Elsevier Interactive Patient Education [employer].

## 2015-10-10 NOTE — Progress Notes (Signed)
Pre visit review using our clinic review tool, if applicable. No additional management support is needed unless otherwise documented below in the visit note. 

## 2015-10-16 ENCOUNTER — Encounter: Payer: Self-pay | Admitting: Family Medicine

## 2015-10-16 NOTE — Progress Notes (Signed)
Patient ID: [name], female   DOB: [birth year], 75 y.o.   MRN: [id number]   Subjective:    Patient ID: [name], female    DOB: 02-09-1941, 75 y.o.   MRN: [id number]  Chief Complaint  Patient presents with  . Medicare Wellness    HPI Patient is in today for wellness visit and follow-up on numerous medical concerns. She is accompanied by her husband today. 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 She gets good support from her church and feels she is holding up well. No recent hospitalization. No acute concerns or illness. Is doing well with ADLs. Maintains heart healthy diet. Denies CP/palp/SOB/HA/congestion/fevers/GI or GU c/o. Taking meds as prescribed  Past Medical History  Diagnosis Date  . Hyperlipidemia   . Cancer ([place])   . Arthritis   . 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 Dyslipidemia   . Obesity   . Glaucoma 11/02/2012  . HTN (hypertension) 11/02/2012  . Abnormal thyroid function test 07/01/2013  . Dermatitis 11/25/2013    Past Surgical History  Procedure Laterality Date  . Breast surgery  February 2007    Left Mastectomy  . Dilation and curettage of uterus    . Excision morton's neuroma      right foot    Family History  Problem Relation Age of Onset  . Pneumonia Mother   . Hyperlipidemia Mother   . Parkinsonism Mother   . Other Father     bilateral subdural hematoma  . Colon cancer Paternal Grandmother   . Prostate cancer Neg Hx   . Breast cancer Neg Hx   . Heart disease Neg Hx   . Diabetes Neg Hx     Social History   Social History  . Marital Status: Married    Spouse Name: N/A  . Number of Children: N/A  . Years of Education: N/A   Occupational History  . Not on file.   Social History Main Topics  . Smoking status: Never Smoker   . Smokeless tobacco: Never Used  . Alcohol Use: No  . Drug Use: No  . Sexual  Activity: Not on file   Other Topics Concern  . Not on file   Social History Narrative    Outpatient Prescriptions Prior to Visit  Medication Sig Dispense Refill  . B Complex Vitamins (B COMPLEX 100 PO) Take 100 mg by mouth daily.    . Cholecalciferol (VITAMIN D-3) 5000 UNITS TABS Take 5,000 mg by mouth daily.    . Coenzyme Q10 (COQ10) 200 MG CAPS Take 1 capsule by mouth daily.    . dorzolamide-timolol (COSOPT) 22.3-6.8 MG/ML ophthalmic solution Place 1 drop into the right eye 2 (two) times daily.    Javier Docker Oil 1000 MG CAPS Take by mouth. Mega Red    . latanoprost (XALATAN) 0.005 % ophthalmic solution Place 1 drop into the right eye at bedtime.    Marland Kitchen UNABLE TO FIND RX: [id number] post surgical bras qty: 6        J8841 silicone breast prosthesis qty: 1 DX: 174.9 left mastectomy 1 each 0  . Probiotic Product (PROBIOTIC DAILY) CAPS Take by mouth daily. Reported on 10/10/2015    . ergocalciferol (VITAMIN D2) 50000 UNITS capsule Take 1 capsule (50,000 Units total) by mouth once a week. (Patient not taking: Reported on 10/10/2015) 4 capsule 4  No facility-administered medications prior to visit.    Allergies  Allergen Reactions  . Livalo [Pitavastatin]     ENTERED IN ERROR  . Rosuvastatin Other (See Comments)    Joint & muscle aches, sleep issues.  . Adhesive [Tape] Other (See Comments)    Causes skin to turn red where touched    Review of Systems  Constitutional: Negative for fever, chills and malaise/fatigue.  HENT: Negative for congestion and hearing loss.   Eyes: Negative for discharge.  Respiratory: Negative for cough, sputum production and shortness of breath.   Cardiovascular: Negative for chest pain, palpitations and leg swelling.  Gastrointestinal: Negative for heartburn, nausea, vomiting, abdominal pain, diarrhea, constipation and blood in stool.  Genitourinary: Negative for dysuria, urgency, frequency and hematuria.  Musculoskeletal: Negative for myalgias, back pain and  falls.  Skin: Negative for rash.  Neurological: Negative for dizziness, sensory change, loss of consciousness, weakness and headaches.  Endo/Heme/Allergies: Negative for environmental allergies. Does not bruise/bleed easily.  Psychiatric/Behavioral: Negative for depression and suicidal ideas. The patient is nervous/anxious. The patient does not have insomnia.        Objective:    Physical Exam  Constitutional: She is oriented to person, place, and time. She appears well-developed and well-nourished. No distress.  HENT:  Head: Normocephalic and atraumatic.  Eyes: Conjunctivae are normal.  Neck: Neck supple. No thyromegaly present.  Cardiovascular: Normal rate, regular rhythm and normal heart sounds.   No murmur heard. Pulmonary/Chest: Effort normal and breath sounds normal. No respiratory distress.  Abdominal: Soft. Bowel sounds are normal. She exhibits no distension and no mass. There is no tenderness.  Musculoskeletal: She exhibits no edema.  Lymphadenopathy:    She has no cervical adenopathy.  Neurological: She is alert and oriented to person, place, and time.  Skin: Skin is warm and dry.  Psychiatric: She has a normal mood and affect. Her behavior is normal.    BP 136/82 mmHg  Pulse 93  Temp(Src) 98.4 F (36.9 C) (Oral)  Ht '5\' 3"'  (1.6 m)  Wt 173 lb 8 oz (78.699 kg)  BMI 30.74 kg/m2  SpO2 97% Wt Readings from Last 3 Encounters:  10/10/15 173 lb 8 oz (78.699 kg)  04/04/15 174 lb 6.4 oz (79.107 kg)  09/21/14 177 lb 3.2 oz (80.377 kg)     Lab Results  Component Value Date   WBC 5.4 09/30/2015   HGB 13.4 09/30/2015   HCT 41.0 09/30/2015   PLT 236.0 09/30/2015   GLUCOSE 76 09/30/2015   CHOL 266* 09/30/2015   TRIG 115.0 09/30/2015   HDL 45.30 09/30/2015   LDLCALC 198* 09/30/2015   ALT 17 09/30/2015   AST 15 09/30/2015   NA 142 09/30/2015   K 3.6 09/30/2015   CL 107 09/30/2015   CREATININE 0.65 09/30/2015   BUN 19 09/30/2015   CO2 26 09/30/2015   TSH 3.39  09/30/2015   HGBA1C 6.0 04/12/2014    Lab Results  Component Value Date   TSH 3.39 09/30/2015   Lab Results  Component Value Date   WBC 5.4 09/30/2015   HGB 13.4 09/30/2015   HCT 41.0 09/30/2015   MCV 88.7 09/30/2015   PLT 236.0 09/30/2015   Lab Results  Component Value Date   NA 142 09/30/2015   K 3.6 09/30/2015   CO2 26 09/30/2015   GLUCOSE 76 09/30/2015   BUN 19 09/30/2015   CREATININE 0.65 09/30/2015   BILITOT 0.8 09/30/2015   ALKPHOS 57 09/30/2015   AST 15 09/30/2015  ALT 17 09/30/2015   PROT 6.6 09/30/2015   ALBUMIN 4.1 09/30/2015   CALCIUM 8.6 09/30/2015   GFR 94.66 09/30/2015   Lab Results  Component Value Date   CHOL 266* 09/30/2015   Lab Results  Component Value Date   HDL 45.30 09/30/2015   Lab Results  Component Value Date   LDLCALC 198* 09/30/2015   Lab Results  Component Value Date   TRIG 115.0 09/30/2015   Lab Results  Component Value Date   CHOLHDL 6 09/30/2015   Lab Results  Component Value Date   HGBA1C 6.0 04/12/2014       Assessment & Plan:   Problem List Items Addressed This Visit    HTN (hypertension)    Well controlled, no changes to meds. Encouraged heart healthy diet such as the DASH diet and exercise as tolerated.       Hyperlipidemia, mixed    Patient does not tolerate statins. Encouraged heart healthy diet, increase exercise, avoid trans fats, consider a krill oil cap daily      Medicare annual wellness visit, subsequent    Patient denies any difficulties at home. No trouble with ADLs, depression or falls. See EMR for functional status screen and depression screen. No recent changes to vision or hearing. Is UTD with immunizations. Is UTD with screening. Discussed Advanced Directives. Encouraged heart healthy diet, exercise as tolerated and adequate sleep. See patient's problem list for health risk factors to monitor. See AVS for preventative healthcare recommendation schedule. Labs reviewed.  Follows with  dermatology Follows with gastroenterology, Dr Deatra Ina, colonoscopy was in 2011, needs repeat in 5-10 years Followed with GYN Dr Milbert Coulter but he retired.  MGM in November of 2015  Follows with Dr Katy Fitch of opthamology      Obesity (BMI 30-39.9)    Encouraged DASH diet, decrease po intake and increase exercise as tolerated. Needs 7-8 hours of sleep nightly. Avoid trans fats, eat small, frequent meals every 4-5 hours with lean proteins, complex carbs and healthy fats. Minimize simple carbs      Vitamin D deficiency    Needs hi dose daily supplements and recheck with next visit.      Relevant Orders   Vitamin D (25 hydroxy)    Other Visit Diagnoses    Hyperlipidemia    -  Primary    Relevant Orders    Lipid panel    Comp Met (CMET)    CBC with Differential/Platelet    TSH    Obesity, unspecified        Relevant Orders    Lipid panel    Comp Met (CMET)    CBC with Differential/Platelet    TSH       I have discontinued Ms. Witt's ergocalciferol. I am also having her maintain her B Complex Vitamins (B COMPLEX 100 PO), Vitamin D-3, dorzolamide-timolol, CoQ10, latanoprost, UNABLE TO FIND, Krill Oil, and PROBIOTIC DAILY.  No orders of the defined types were placed in this encounter.     Penni Homans, MD

## 2015-10-16 NOTE — Assessment & Plan Note (Signed)
Needs hi dose daily supplements and recheck with next visit.

## 2015-10-16 NOTE — Assessment & Plan Note (Signed)
Patient denies any difficulties at home. No trouble with ADLs, depression or falls. See EMR for functional status screen and depression screen. No recent changes to vision or hearing. Is UTD with immunizations. Is UTD with screening. Discussed Advanced Directives. Encouraged heart healthy diet, exercise as tolerated and adequate sleep. See patient's problem list for health risk factors to monitor. See AVS for preventative healthcare recommendation schedule. Labs reviewed.  Follows with dermatology Follows with gastroenterology, Dr Deatra Ina, colonoscopy was in 2011, needs repeat in 5-10 years Followed with GYN Dr Milbert Coulter but he retired.  MGM in November of 2015  Follows with Dr Katy Fitch of opthamology

## 2015-10-16 NOTE — Assessment & Plan Note (Signed)
Well controlled, no changes to meds. Encouraged heart healthy diet such as the DASH diet and exercise as tolerated.  °

## 2015-10-16 NOTE — Assessment & Plan Note (Signed)
Patient does not tolerate statins. Encouraged heart healthy diet, increase exercise, avoid trans fats, consider a krill oil cap daily

## 2015-10-16 NOTE — Assessment & Plan Note (Signed)
Encouraged DASH diet, decrease po intake and increase exercise as tolerated. Needs 7-8 hours of sleep nightly. Avoid trans fats, eat small, frequent meals every 4-5 hours with lean proteins, complex carbs and healthy fats. Minimize simple carbs 

## 2015-12-10 ENCOUNTER — Encounter (HOSPITAL_BASED_OUTPATIENT_CLINIC_OR_DEPARTMENT_OTHER): Payer: Self-pay | Admitting: *Deleted

## 2015-12-10 ENCOUNTER — Telehealth (HOSPITAL_BASED_OUTPATIENT_CLINIC_OR_DEPARTMENT_OTHER): Payer: Self-pay | Admitting: *Deleted

## 2015-12-10 ENCOUNTER — Emergency Department (HOSPITAL_BASED_OUTPATIENT_CLINIC_OR_DEPARTMENT_OTHER)
Admission: EM | Admit: 2015-12-10 | Discharge: 2015-12-10 | Disposition: A | Discharge status: Home / Self Care | Payer: Medicare Other | Attending: Emergency Medicine | Admitting: Emergency Medicine

## 2015-12-10 ENCOUNTER — Emergency Department (HOSPITAL_BASED_OUTPATIENT_CLINIC_OR_DEPARTMENT_OTHER): Payer: Medicare Other

## 2015-12-10 DIAGNOSIS — Z853 Personal history of malignant neoplasm of breast: Secondary | ICD-10-CM | POA: Diagnosis not present

## 2015-12-10 DIAGNOSIS — J029 Acute pharyngitis, unspecified: Secondary | ICD-10-CM | POA: Diagnosis not present

## 2015-12-10 DIAGNOSIS — R05 Cough: Secondary | ICD-10-CM | POA: Diagnosis present

## 2015-12-10 DIAGNOSIS — Z79899 Other long term (current) drug therapy: Secondary | ICD-10-CM | POA: Diagnosis not present

## 2015-12-10 DIAGNOSIS — Z8739 Personal history of other diseases of the musculoskeletal system and connective tissue: Secondary | ICD-10-CM | POA: Insufficient documentation

## 2015-12-10 DIAGNOSIS — I10 Essential (primary) hypertension: Secondary | ICD-10-CM | POA: Diagnosis not present

## 2015-12-10 DIAGNOSIS — H409 Unspecified glaucoma: Secondary | ICD-10-CM | POA: Insufficient documentation

## 2015-12-10 DIAGNOSIS — Z872 Personal history of diseases of the skin and subcutaneous tissue: Secondary | ICD-10-CM | POA: Diagnosis not present

## 2015-12-10 DIAGNOSIS — Z8719 Personal history of other diseases of the digestive system: Secondary | ICD-10-CM | POA: Diagnosis not present

## 2015-12-10 DIAGNOSIS — E669 Obesity, unspecified: Secondary | ICD-10-CM | POA: Diagnosis not present

## 2015-12-10 DIAGNOSIS — R059 Cough, unspecified: Secondary | ICD-10-CM

## 2015-12-10 LAB — RAPID STREP SCREEN (MED CTR MEBANE ONLY): Streptococcus, Group A Screen (Direct): NEGATIVE

## 2015-12-10 MED ORDER — HYDROCOD POLST-CPM POLST ER 10-8 MG/5ML PO SUER: 5.0000 mL | Freq: Two times a day (BID) | ORAL | Status: DC | PRN

## 2015-12-10 MED ORDER — AZITHROMYCIN 250 MG PO TABS: ORAL_TABLET | ORAL | Status: DC

## 2015-12-10 NOTE — ED Notes (Signed)
DC instructions reviewed with pt along with Rx as written by EDP. Opportunity for questions provided

## 2015-12-10 NOTE — ED Notes (Signed)
Pt's husband (who is a retired MD) called concerned that pt now has fever of 100.4 and requested an Rx for a z-pack be written. Discussed with Dr. Darl Householder. Spoke with pt directly and explained Dr. Darl Householder would provide prescription for pick-up but advises her to treat symptoms as previously instructed over the weekend and only fill prescription if there is no improvement. Also pt should f/u with PCP on Monday. Pt verbalized understanding.

## 2015-12-10 NOTE — Discharge Instructions (Signed)
Try over the counter robitussin or delsym for cough.   Try tussionex if cough not improving.   See your doctor.   Return to ER if you have worse cough, fever, vomiting, worse sore throat.

## 2015-12-10 NOTE — ED Provider Notes (Addendum)
CSN: [id number]     Arrival date & time 12/10/15  [id number] History   First MD Initiated Contact with Patient 12/10/15 [phone]     Chief Complaint  Patient presents with  . Cough     (Consider location/radiation/quality/duration/timing/severity/associated sxs/prior Treatment) The history is provided by the patient.  [name] is a 75 y.o. female hx of HL, breast cancer in remission, HTN here with sore throat, cough. Sore throat since yesterday that improved. Have a nonproductive cough. Denies any fevers or chills. Had gastroenteritis about 3 weeks ago that resolved. She thought she had bronchitis and had it previously that improved with Zpack. She's never smoker.       Past Medical History  Diagnosis Date  . Hyperlipidemia   . Cancer ([place])   . Arthritis   . 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 Dyslipidemia   . Obesity   . Glaucoma 11/02/2012  . HTN (hypertension) 11/02/2012  . Abnormal thyroid function test 07/01/2013  . Dermatitis 11/25/2013   Past Surgical History  Procedure Laterality Date  . Breast surgery  February 2007    Left Mastectomy  . Dilation and curettage of uterus    . Excision morton's neuroma      right foot   Family History  Problem Relation Age of Onset  . Pneumonia Mother   . Hyperlipidemia Mother   . Parkinsonism Mother   . Other Father     bilateral subdural hematoma  . Colon cancer Paternal Grandmother   . Prostate cancer Neg Hx   . Breast cancer Neg Hx   . Heart disease Neg Hx   . Diabetes Neg Hx    Social History  Substance Use Topics  . Smoking status: Never Smoker   . Smokeless tobacco: Never Used  . Alcohol Use: No   OB History    No data available     Review of Systems  Respiratory: Positive for cough.   All other systems reviewed and are negative.     Allergies  Livalo; Rosuvastatin; and Adhesive  Home Medications   Prior to Admission medications   Medication Sig Start Date End  Date Taking? Authorizing Provider  B Complex Vitamins (B COMPLEX 100 PO) Take 100 mg by mouth daily.    Historical Provider, MD  Cholecalciferol (VITAMIN D-3) 5000 UNITS TABS Take 5,000 mg by mouth daily.    Historical Provider, MD  Coenzyme Q10 (COQ10) 200 MG CAPS Take 1 capsule by mouth daily.    Historical Provider, MD  dorzolamide-timolol (COSOPT) 22.3-6.8 MG/ML ophthalmic solution Place 1 drop into the right eye 2 (two) times daily.    Historical Provider, MD  Javier Docker Oil 1000 MG CAPS Take by mouth. Mega Red    Historical Provider, MD  latanoprost (XALATAN) 0.005 % ophthalmic solution Place 1 drop into the right eye at bedtime.    Historical Provider, MD  Probiotic Product (PROBIOTIC DAILY) CAPS Take by mouth daily. Reported on 10/10/2015    Historical Provider, MD  UNABLE TO FIND RX: [id number] post surgical bras qty: 6        [id number] silicone breast prosthesis qty: 1 DX: 174.9 left mastectomy 12/28/13   Stark Klein, MD   BP 151/87 mmHg  Pulse 110  Temp(Src) 98.5 F (36.9 C) (Oral)  Resp 18  Ht 5\' 3"  (1.6 m)  Wt 160 lb (72.576 kg)  BMI 28.35 kg/m2  SpO2 96% Physical Exam  Constitutional: She is oriented to  person, place, and time. She appears well-developed.  HENT:  Head: Normocephalic.  OP slightly red, no obvious tonsillar exudates, uvula midline   Eyes: Conjunctivae are normal. Pupils are equal, round, and reactive to light.  Neck: Normal range of motion. Neck supple.  No cervical LAD   Cardiovascular: Normal rate, regular rhythm and normal heart sounds.   Pulmonary/Chest: Effort normal and breath sounds normal. No respiratory distress. She has no wheezes. She has no rales.  Abdominal: Soft. Bowel sounds are normal. She exhibits no distension. There is no tenderness. There is no rebound.  Musculoskeletal: Normal range of motion.  Neurological: She is alert and oriented to person, place, and time. No cranial nerve deficit. Coordination normal.  Skin: Skin is warm and dry.   Psychiatric: She has a normal mood and affect. Her behavior is normal. Judgment and thought content normal.  Nursing note and vitals reviewed.   ED Course  Procedures (including critical care time) Labs Review Labs Reviewed  RAPID STREP SCREEN (NOT AT [facility])  CULTURE, GROUP A STREP [facility])    Imaging Review Dg Chest 2 View  12/10/2015  CLINICAL DATA:  Acute cough. EXAM: CHEST  2 VIEW COMPARISON:  October 15, 2005. FINDINGS: The heart size and mediastinal contours are within normal limits. Both lungs are clear. The visualized skeletal structures are unremarkable. IMPRESSION: No active cardiopulmonary disease. Electronically Signed   By: Marijo Conception, M.D.   On: 12/10/2015 08:22   I have personally reviewed and evaluated these images and lab results as part of my medical decision-making.   EKG Interpretation None      MDM   Final diagnoses:  None    [name] is a 75 y.o. female here with cough. Likely bronchitis vs early pneumonia. Afebrile here. Also has sore throat. Will get rapid strep, CXR.   8:41 AM CXR clear. Rapid strep neg. Afebrile. No wheezing on repeat exam. Will dc home with prn tussionex for cough.   11:19 AM Husband called. States that patient developed a fever 100.4 F at home and requesting Zpack. Nurse told him that CXR is clear. But husband insisting on abx. We counseled him regarding antibiotic resistance and that she doesn't need abx. But given fever at home, I will prescribe zpack as requested by husband.   Wandra Arthurs, MD 12/10/15 [id number]  Wandra Arthurs, MD 12/10/15 [phone]

## 2015-12-10 NOTE — ED Notes (Signed)
Patient transported to X-ray 

## 2015-12-10 NOTE — ED Notes (Signed)
Began yesterday with throat irritation and then now has cough, cough began yesterday PM, non-prod cough

## 2015-12-10 NOTE — ED Notes (Signed)
MD at bedside. 

## 2015-12-12 LAB — CULTURE, GROUP A STREP (THRC)

## 2016-04-09 ENCOUNTER — Other Ambulatory Visit: Payer: Medicare Other

## 2016-04-16 ENCOUNTER — Other Ambulatory Visit: Payer: Medicare Other

## 2016-04-16 ENCOUNTER — Encounter: Payer: Medicare Other | Admitting: Family Medicine

## 2016-04-23 ENCOUNTER — Ambulatory Visit: Payer: Medicare Other | Admitting: Family Medicine

## 2016-05-03 ENCOUNTER — Other Ambulatory Visit (INDEPENDENT_AMBULATORY_CARE_PROVIDER_SITE_OTHER): Payer: Medicare Other

## 2016-05-03 ENCOUNTER — Other Ambulatory Visit: Payer: Self-pay | Admitting: Family Medicine

## 2016-05-03 DIAGNOSIS — E785 Hyperlipidemia, unspecified: Secondary | ICD-10-CM | POA: Diagnosis not present

## 2016-05-03 DIAGNOSIS — E559 Vitamin D deficiency, unspecified: Secondary | ICD-10-CM

## 2016-05-03 DIAGNOSIS — E669 Obesity, unspecified: Secondary | ICD-10-CM | POA: Diagnosis not present

## 2016-05-03 LAB — CBC WITH DIFFERENTIAL/PLATELET
Basophils Absolute: 0.1 10*3/uL (ref 0.0–0.1)
Basophils Relative: 1.1 % (ref 0.0–3.0)
Eosinophils Absolute: 0.1 10*3/uL (ref 0.0–0.7)
Eosinophils Relative: 2.3 % (ref 0.0–5.0)
HCT: 41.5 % (ref 36.0–46.0)
Hemoglobin: 14 g/dL (ref 12.0–15.0)
Lymphocytes Relative: 27.7 % (ref 12.0–46.0)
Lymphs Abs: 1.7 10*3/uL (ref 0.7–4.0)
MCHC: 33.7 g/dL (ref 30.0–36.0)
MCV: 88.7 fl (ref 78.0–100.0)
Monocytes Absolute: 0.5 10*3/uL (ref 0.1–1.0)
Monocytes Relative: 8.4 % (ref 3.0–12.0)
Neutro Abs: 3.8 10*3/uL (ref 1.4–7.7)
Neutrophils Relative %: 60.5 % (ref 43.0–77.0)
Platelets: 241 10*3/uL (ref 150.0–400.0)
RBC: 4.68 Mil/uL (ref 3.87–5.11)
RDW: 12.7 % (ref 11.5–15.5)
WBC: 6.2 10*3/uL (ref 4.0–10.5)

## 2016-05-03 LAB — LIPID PANEL
Cholesterol: 314 mg/dL — ABNORMAL HIGH (ref 0–200)
HDL: 59.9 mg/dL (ref >39.00)
LDL Cholesterol: 219 mg/dL — ABNORMAL HIGH (ref 0–99)
NonHDL: 254.34
Total CHOL/HDL Ratio: 5
Triglycerides: 177 mg/dL — ABNORMAL HIGH (ref 0.0–149.0)
VLDL: 35.4 mg/dL (ref 0.0–40.0)

## 2016-05-03 LAB — COMPREHENSIVE METABOLIC PANEL
ALT: 20 U/L (ref 0–35)
AST: 17 U/L (ref 0–37)
Albumin: 4.4 g/dL (ref 3.5–5.2)
Alkaline Phosphatase: 63 U/L (ref 39–117)
BUN: 14 mg/dL (ref 6–23)
CO2: 28 mEq/L (ref 19–32)
Calcium: 8.9 mg/dL (ref 8.4–10.5)
Chloride: 106 mEq/L (ref 96–112)
Creatinine, Ser: 0.71 mg/dL (ref 0.40–1.20)
GFR: 85.36 mL/min (ref >60.00)
Glucose, Bld: 88 mg/dL (ref 70–99)
Potassium: 4.1 mEq/L (ref 3.5–5.1)
Sodium: 142 mEq/L (ref 135–145)
Total Bilirubin: 0.8 mg/dL (ref 0.2–1.2)
Total Protein: 6.5 g/dL (ref 6.0–8.3)

## 2016-05-03 LAB — TSH: TSH: 4.78 u[IU]/mL — ABNORMAL HIGH (ref 0.35–4.50)

## 2016-05-03 LAB — VITAMIN D 25 HYDROXY (VIT D DEFICIENCY, FRACTURES): VITD: 10.53 ng/mL — ABNORMAL LOW (ref 30.00–100.00)

## 2016-05-03 MED ORDER — LEVOTHYROXINE SODIUM 25 MCG PO TABS: 25.0000 ug | ORAL_TABLET | Freq: Every day | ORAL | 3 refills | Status: DC

## 2016-05-03 MED ORDER — VITAMIN D (ERGOCALCIFEROL) 1.25 MG (50000 UNIT) PO CAPS: 50000.0000 [IU] | ORAL_CAPSULE | ORAL | 0 refills | Status: DC

## 2016-05-10 ENCOUNTER — Encounter: Payer: Self-pay | Admitting: Family Medicine

## 2016-05-10 ENCOUNTER — Ambulatory Visit (INDEPENDENT_AMBULATORY_CARE_PROVIDER_SITE_OTHER): Payer: Medicare Other | Admitting: Family Medicine

## 2016-05-10 VITALS — BP 180/92 | HR 112 | Temp 98.0°F | Ht 62.0 in | Wt 174.0 lb

## 2016-05-10 DIAGNOSIS — Z1211 Encounter for screening for malignant neoplasm of colon: Secondary | ICD-10-CM | POA: Diagnosis not present

## 2016-05-10 DIAGNOSIS — IMO0001 Reserved for inherently not codable concepts without codable children: Secondary | ICD-10-CM

## 2016-05-10 DIAGNOSIS — E782 Mixed hyperlipidemia: Secondary | ICD-10-CM

## 2016-05-10 DIAGNOSIS — E039 Hypothyroidism, unspecified: Secondary | ICD-10-CM

## 2016-05-10 DIAGNOSIS — Z23 Encounter for immunization: Secondary | ICD-10-CM

## 2016-05-10 DIAGNOSIS — R030 Elevated blood-pressure reading, without diagnosis of hypertension: Secondary | ICD-10-CM

## 2016-05-10 DIAGNOSIS — Z800 Family history of malignant neoplasm of digestive organs: Secondary | ICD-10-CM | POA: Diagnosis not present

## 2016-05-10 DIAGNOSIS — Z780 Asymptomatic menopausal state: Secondary | ICD-10-CM

## 2016-05-10 NOTE — Progress Notes (Signed)
Pre visit review using our clinic review tool, if applicable. No additional management support is needed unless otherwise documented below in the visit note. 

## 2016-05-10 NOTE — Progress Notes (Signed)
Phone: [phone]  Subjective:  Patient presents today to establish care with me as their new primary care provider. Patient was formerly a patient of Dr. Charlett Blake. Chief complaint-noted.   See problem oriented charting- ROS- No chest pain or shortness of breath. No headache or blurry vision. No unintentional weight gain, hair or nail changes, constipation.   The following were reviewed and entered/updated in epic: Past Medical History:  Diagnosis Date  . Abnormal thyroid function test 07/01/2013  . Arthritis   . Cancer ([place])   . Dermatitis 11/25/2013  . Dyslipidemia   . Glaucoma 11/02/2012  . HTN (hypertension) 11/02/2012  . 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 Hyperlipidemia   . Obesity    Patient Active Problem List   Diagnosis Date Noted  . Hypothyroidism 07/01/2013    Priority: Medium  . White coat hypertension 11/02/2012    Priority: Medium  . Hyperlipidemia, mixed 07/24/2011    Priority: Medium  . 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 Medicare annual wellness visit, subsequent 09/26/2014    Priority: Low  . Glaucoma 11/02/2012    Priority: Low  . Vitamin D deficiency 11/02/2012    Priority: Low  . Obesity (BMI 30-39.9) 07/24/2011    Priority: Low   Past Surgical History:  Procedure Laterality Date  . BREAST SURGERY  10/2005   Left Mastectomy, reconstruction, reduction on right. Dr. Harlow Mares  . CRYOTHERAPY     GYN for CIN III  . DILATION AND CURETTAGE OF UTERUS    . EXCISION MORTON'S NEUROMA     right foot    Family History  Problem Relation Age of Onset  . Pneumonia Mother   . Hyperlipidemia Mother   . Parkinsonism Mother   . Other Father     bilateral subdural hematoma  . Colon cancer Paternal Grandmother   . Hyperlipidemia Sister   . Prostate cancer Neg Hx   . Breast cancer Neg Hx   . Heart disease Neg Hx   . Diabetes Neg Hx     Medications-  reviewed and updated Current Outpatient Prescriptions  Medication Sig Dispense Refill  . B Complex Vitamins (B COMPLEX 100 PO) Take 100 mg by mouth daily.    . calcium citrate (CALCITRATE - DOSED IN MG ELEMENTAL CALCIUM) 950 MG tablet Take 200 mg of elemental calcium by mouth daily.    . Cholecalciferol (VITAMIN D-3) 5000 UNITS TABS Take 5,000 mg by mouth daily.    . Coenzyme Q10 (COQ10) 200 MG CAPS Take 1 capsule by mouth daily.    . cromolyn (OPTICROM) 4 % ophthalmic solution Place 1 drop into the right eye 4 (four) times daily.    . dorzolamide-timolol (COSOPT) 22.3-6.8 MG/ML ophthalmic solution Place 1 drop into the right eye 2 (two) times daily.    Javier Docker Oil 1000 MG CAPS Take by mouth. Mega Red    . levothyroxine (SYNTHROID, LEVOTHROID) 25 MCG tablet Take 1 tablet (25 mcg total) by mouth daily before breakfast. 30 tablet 3  . Omega-3 Fatty Acids (OMEGA III EPA+DHA PO) Take by mouth.    . Probiotic Product (PROBIOTIC DAILY) CAPS Take by mouth daily. Reported on 10/10/2015    . UNABLE TO FIND RX: L8000 post surgical bras qty: 6        [id number] silicone breast prosthesis qty: 1 DX: 174.9 left mastectomy 1 each 0  . Vitamin D,  Ergocalciferol, (DRISDOL) 50000 units CAPS capsule Take 1 capsule (50,000 Units total) by mouth every 7 (seven) days. 12 capsule 0   No current facility-administered medications for this visit.     Allergies-reviewed and updated Allergies  Allergen Reactions  . Livalo [Pitavastatin]     ENTERED IN ERROR  . Rosuvastatin Other (See Comments)    Joint & muscle aches, sleep issues.  . Adhesive [Tape] Other (See Comments)    Causes skin to turn red where touched    Social History   Social History  . Marital status: Married    Spouse name: N/A  . Number of children: N/A  . Years of education: N/A   Social History Main Topics  . Smoking status: Never Smoker  . Smokeless tobacco: Never Used  . Alcohol use Yes     Comment: social, rare  . Drug use: No  .  Sexual activity: Not Asked   Other Topics Concern  . None   Social History Narrative   Married 1981. 1 daughter, 2 step sons. 4 grandchildren      Oncologist      Hobbies: going out to eat, activities with church including cancer support group at church    ROS--See HPI   Objective: BP (!) 180/92 (BP Location: Right Arm, Patient Position: Sitting, Cuff Size: Normal)   Pulse (!) 112   Temp 98 F (36.7 C) (Oral)   Ht 5\' 2"  (1.575 m)   Wt 174 lb (78.9 kg)   SpO2 95%   BMI 31.83 kg/m  Gen: NAD, resting comfortably HEENT: Mucous membranes are moist. Oropharynx normal Neck: no thyromegaly CV: RRR no murmurs rubs or gallops (HR 96 on my exam) Lungs: CTAB no crackles, wheeze, rhonchi Abdomen: soft/nontender/nondistended/normal bowel sounds. No rebound or guarding.  Ext: no edema Skin: warm, dry Neuro: grossly normal, moves all extremities, PERRLA  Assessment/Plan:  Hypothyroidism S: Recently Levothyroxine 25 g started by Dr. Charlett Blake. TSH around 5.  A/P: Recent start, can check TSH with next physical, this appears likely to be subclinical hypothyroidism the patient and prior PCP had decided to treat-we will continue   White coat hypertension S: Home readings 115-130/70-85. She is not on any medication. She has had multiple elevations in the past though this one is higher than normal-would note that this is a new environment for patient and she admits to some anxiety BP Readings from Last 3 Encounters:  05/10/16 (!) 180/92  12/10/15 151/87  10/10/15 136/82  A/P: Her husband is retired physician-he keeps a close eye on blood pressure at home. They will go home and check her blood pressure with goal being less than 140/90. He is following up with me next week and we will recheck her blood pressure at that time as well as check her home cuff.   Hyperlipidemia, mixed S: Does not tolerate statins- severe myalgias. Failed livalo, crestor, lipitor, simvastatin.  A/P:  Suspect familial hypercholesterolemia with total cholesterol over 300 and LDL over 190. Attempted to refer to lipid clinic in cardiology but this was not the correct order. Will have Roselyn Reef, nurse, call to figure out appropriate referral pattern. I do think she would be a good candidate for PCSK9 inhibitor class  CPE in next few months  Orders Placed This Encounter  Procedures  . DG Bone Density    Standing Status:   Future    Standing Expiration Date:   07/10/2017    Order Specific Question:   Reason for Exam (SYMPTOM  OR  DIAGNOSIS REQUIRED)    Answer:   postmenopausal    Order Specific Question:   Preferred imaging location?    Answer:   [facility]  . Flu vaccine HIGH DOSE PF  . Ambulatory referral to Lipid Clinic    Referral Priority:   Routine    Referral Type:   Consultation    Referral Reason:   Specialty Services Required    Requested Specialty:   Hematology    Number of Visits Requested:   1  . Ambulatory referral to Gastroenterology    Referral Priority:   Routine    Referral Type:   Consultation    Referral Reason:   Specialty Services Required    Referred to Provider:   Ladene Artist, MD    Number of Visits Requested:   1    Meds ordered this encounter  Medications  . calcium citrate (CALCITRATE - DOSED IN MG ELEMENTAL CALCIUM) 950 MG tablet    Sig: Take 200 mg of elemental calcium by mouth daily.  . cromolyn (OPTICROM) 4 % ophthalmic solution    Sig: Place 1 drop into the right eye 4 (four) times daily.  . Omega-3 Fatty Acids (OMEGA III EPA+DHA PO)    Sig: Take by mouth.   The duration of face-to-face time during this visit was greater than 25 minutes. Greater than 50% of this time was spent in counseling, explanation of diagnosis, planning of further management, and/or coordination of care including discussing blood pressure goals, discussing option of treating thyroid, risks of blood pressure running this high truly, discussion of lipid goals and options from  here.    Return precautions advised.   Garret Reddish, MD

## 2016-05-10 NOTE — Patient Instructions (Addendum)
We will call you within a week about your referral to lipid clinic and GI for colonoscopy. If you do not hear within 2 weeks, give [place] a call.   High dose flu shot today  Schedule next available physical and will do pap smear, breast exam, next and final pneumonia shot  Schedule your bone density test at check out desk

## 2016-05-11 ENCOUNTER — Other Ambulatory Visit: Payer: Self-pay

## 2016-05-11 DIAGNOSIS — E785 Hyperlipidemia, unspecified: Secondary | ICD-10-CM

## 2016-05-11 NOTE — Assessment & Plan Note (Signed)
S: Recently Levothyroxine 25 g started by Dr. Charlett Blake. TSH around 5.  A/P: Recent start, can check TSH with next physical, this appears likely to be subclinical hypothyroidism the patient and prior PCP had decided to treat-we will continue

## 2016-05-11 NOTE — Assessment & Plan Note (Signed)
S: Does not tolerate statins- severe myalgias. Failed livalo, crestor, lipitor, simvastatin.  A/P: Suspect familial hypercholesterolemia with total cholesterol over 300 and LDL over 190. Attempted to refer to lipid clinic in cardiology but this was not the correct order. Will have [name], nurse, call to figure out appropriate referral pattern. I do think she would be a good candidate for PCSK9 inhibitor class

## 2016-05-11 NOTE — Assessment & Plan Note (Signed)
S: Home readings 115-130/70-85. She is not on any medication. She has had multiple elevations in the past though this one is higher than normal-would note that this is a new environment for patient and she admits to some anxiety BP Readings from Last 3 Encounters:  05/10/16 (!) 180/92  12/10/15 151/87  10/10/15 136/82  A/P: Her husband is retired physician-he keeps a close eye on blood pressure at home. They will go home and check her blood pressure with goal being less than 140/90. He is following up with me next week and we will recheck her blood pressure at that time as well as check her home cuff.

## 2016-05-21 ENCOUNTER — Other Ambulatory Visit: Payer: Self-pay

## 2016-05-21 ENCOUNTER — Inpatient Hospital Stay: Admission: RE | Admit: 2016-05-21 | Payer: Medicare Other | Source: Ambulatory Visit

## 2016-05-21 ENCOUNTER — Telehealth: Payer: Self-pay | Admitting: Family Medicine

## 2016-05-21 DIAGNOSIS — M25511 Pain in right shoulder: Secondary | ICD-10-CM

## 2016-05-21 NOTE — Telephone Encounter (Signed)
Referral placed for pt as requested ?

## 2016-05-21 NOTE — Telephone Encounter (Signed)
Patient broke her right shoulder.  She went to [facility] Saturday (05/19/16).  She requested to [facility] that she wanted to see an orthopedic doctor in [place] so she needs a referral to do so.  Patient has x-rays printed out and is in an immobilizer sling.

## 2016-05-22 ENCOUNTER — Encounter: Payer: Self-pay | Admitting: Family Medicine

## 2016-05-29 ENCOUNTER — Telehealth: Payer: Self-pay | Admitting: Family Medicine

## 2016-05-29 NOTE — Telephone Encounter (Signed)
Yes thanks, advise given by Roselyn Reef appropriate "I ran in to her in the lobby. I told her just to wait until she was healed. Her arm is in a sling. She states she has a fracture in 2 places" breast exam can certainly wait

## 2016-05-29 NOTE — Telephone Encounter (Signed)
Patient broke her right shoulder.  Patient is being treated by Dr Percell Miller.  Dr Yong Channel was supposed to do a breast exam at her next visit but she has to wait. Patient wants to know if it would be up to Dr Yong Channel or Dr Percell Miller as to when she can lift her arm enough to be able to get a breast exam?

## 2016-05-31 ENCOUNTER — Encounter: Payer: Medicare Other | Admitting: Family Medicine

## 2016-06-11 ENCOUNTER — Telehealth: Payer: Self-pay | Admitting: Gastroenterology

## 2016-06-11 NOTE — Telephone Encounter (Signed)
Dr. Fuller Plan reviewed records and has accepted patient. Ok to schedule Direct Colon. Dr. Fuller Plan is requesting previous colon/path report before we schedule Direct Colon. Patient will have previous reports faxed to our office.

## 2016-06-14 ENCOUNTER — Ambulatory Visit (INDEPENDENT_AMBULATORY_CARE_PROVIDER_SITE_OTHER): Payer: Medicare Other | Admitting: Internal Medicine

## 2016-06-14 ENCOUNTER — Encounter: Payer: Self-pay | Admitting: Internal Medicine

## 2016-06-14 VITALS — BP 168/108 | HR 108 | Ht 62.0 in | Wt 171.6 lb

## 2016-06-14 DIAGNOSIS — I10 Essential (primary) hypertension: Secondary | ICD-10-CM | POA: Diagnosis not present

## 2016-06-14 DIAGNOSIS — E785 Hyperlipidemia, unspecified: Secondary | ICD-10-CM | POA: Diagnosis not present

## 2016-06-14 DIAGNOSIS — Z23 Encounter for immunization: Secondary | ICD-10-CM | POA: Diagnosis not present

## 2016-06-14 DIAGNOSIS — E782 Mixed hyperlipidemia: Secondary | ICD-10-CM

## 2016-06-14 NOTE — Progress Notes (Signed)
Cardiology Office Note   Date:  06/14/2016   ID:  [name], DOB 1941-07-05, MRN [id number]  PCP:  Garret Reddish, MD  Cardiologist:   Dorris Carnes, MD   Pt presents for eval of hyperlipidemia     History of Present Illness: [name] is a 75 y.o. female with a history of profound Hyperlipidemia  She was followed by Larwance Sachs S [facility] Problem with statins  Tried at least Livalo and Crestor  Did not tolerate due to achy joints, muscles.  Dnies CP  Breathing is OK Fell and hurt shoulder  Had surgery.    BP is often high in doctors office  Husband takes at home      Outpatient Medications Prior to Visit  Medication Sig Dispense Refill  . B Complex Vitamins (B COMPLEX 100 PO) Take 100 mg by mouth daily.    . calcium citrate (CALCITRATE - DOSED IN MG ELEMENTAL CALCIUM) 950 MG tablet Take 200 mg of elemental calcium by mouth daily.    . Cholecalciferol (VITAMIN D-3) 5000 UNITS TABS Take 5,000 mg by mouth daily.    . Coenzyme Q10 (COQ10) 200 MG CAPS Take 1 capsule by mouth daily.    . cromolyn (OPTICROM) 4 % ophthalmic solution Place 1 drop into the right eye 4 (four) times daily.    . dorzolamide-timolol (COSOPT) 22.3-6.8 MG/ML ophthalmic solution Place 1 drop into the right eye 2 (two) times daily.    Javier Docker Oil 1000 MG CAPS Take by mouth. Mega Red    . levothyroxine (SYNTHROID, LEVOTHROID) 25 MCG tablet Take 1 tablet (25 mcg total) by mouth daily before breakfast. 30 tablet 3  . Omega-3 Fatty Acids (OMEGA III EPA+DHA PO) Take by mouth.    . Probiotic Product (PROBIOTIC DAILY) CAPS Take by mouth daily. Reported on 10/10/2015    . Vitamin D, Ergocalciferol, (DRISDOL) 50000 units CAPS capsule Take 1 capsule (50,000 Units total) by mouth every 7 (seven) days. (Patient not taking: Reported on 06/14/2016) 12 capsule 0   No facility-administered medications prior to visit.      Allergies:   Livalo [pitavastatin]; Rosuvastatin; and Adhesive [tape]   Past Medical History:   Diagnosis Date  . Abnormal thyroid function test 07/01/2013  . Arthritis   . Cancer ([place])   . Dermatitis 11/25/2013  . Dyslipidemia   . Glaucoma 11/02/2012  . HTN (hypertension) 11/02/2012  . 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 Hyperlipidemia   . Obesity     Past Surgical History:  Procedure Laterality Date  . BREAST SURGERY  10/2005   Left Mastectomy, reconstruction, reduction on right. Dr. Harlow Mares  . CRYOTHERAPY     GYN for CIN III  . DILATION AND CURETTAGE OF UTERUS    . EXCISION MORTON'S NEUROMA     right foot     Social History:  The patient  reports that she has never smoked. She has never used smokeless tobacco. She reports that she drinks alcohol. She reports that she does not use drugs.   Family History:  The patient's family history includes Colon cancer in her paternal grandmother; Hyperlipidemia in her mother and sister; Other in her father; Parkinsonism in her mother; Pneumonia in her mother.    ROS:  Please see the history of present illness. All other systems are reviewed and  Negative to the above problem except as noted.    PHYSICAL EXAM: VS:  BP (!) 168/108  Pulse (!) 108   Ht 5\' 2"  (1.575 m)   Wt 171 lb 9.6 oz (77.8 kg)   BMI 31.39 kg/m   GEN: Well nourished, well developed, in no acute distress  HEENT: normal  Neck: no JVD, carotid bruits, or masses Cardiac: RRR; no murmurs, rubs, or gallops,no edema  Respiratory:  clear to auscultation bilaterally, normal work of breathing GI: soft, nontender, nondistended, + BS  No hepatomegaly  MS: no deformity Moving all extremities   Skin: warm and dry, no rash Neuro:  Strength and sensation are intact Psych: euthymic mood, full affect   EKG:  EKG is ordered today.  ST 108 bpm     Lipid Panel    Component Value Date/Time   CHOL 314 (H) 05/03/2016 0905   TRIG 177.0 (H) 05/03/2016 0905   HDL 59.90 05/03/2016 0905   CHOLHDL 5 05/03/2016 0905   VLDL 35.4  05/03/2016 0905   LDLCALC 219 (H) 05/03/2016 0905      Wt Readings from Last 3 Encounters:  06/14/16 171 lb 9.6 oz (77.8 kg)  05/10/16 174 lb (78.9 kg)  12/10/15 160 lb (72.6 kg)      ASSESSMENT AND PLAN:  1  HL  Reviewed with M Supple PharmD  Pt should be a candidate for Repatha  Will follow up on coverage  Pt is willing to try  2.  HTN  BP is up  Encouraged her to keep log at home and brinhypg cuff in for calibration    F/U based on drug approval      Current medicines are reviewed at length with the patient today.  The patient does not have concerns regarding medicines.  Signed, Dorris Carnes, MD  06/14/2016 2:41 PM    [address], [place], [place]  [address] Phone: [phone]; Fax: [phone]

## 2016-06-25 ENCOUNTER — Encounter: Payer: Self-pay | Admitting: Family Medicine

## 2016-06-27 ENCOUNTER — Ambulatory Visit: Payer: Medicare Other | Attending: Orthopedic Surgery

## 2016-06-27 DIAGNOSIS — M25511 Pain in right shoulder: Secondary | ICD-10-CM | POA: Diagnosis present

## 2016-06-27 DIAGNOSIS — M6281 Muscle weakness (generalized): Secondary | ICD-10-CM | POA: Diagnosis present

## 2016-06-27 DIAGNOSIS — M25611 Stiffness of right shoulder, not elsewhere classified: Secondary | ICD-10-CM | POA: Diagnosis present

## 2016-06-27 NOTE — Therapy (Signed)
[facility] [address], [place], [place], [address] Phone: [phone]   Fax:  [phone]  Physical Therapy Evaluation  Patient Details  Name: [name] MRN: [id number] Date of Birth: [birth year] Referring Provider: Kathryne Hitch, MD  Encounter Date: 06/27/2016      PT End of Session - 06/27/16 0837    Visit Number 1   Number of Visits 10   Date for PT Re-Evaluation 08/22/16   PT Start Time 0805   PT Stop Time 0840   PT Time Calculation (min) 35 min   Activity Tolerance Patient tolerated treatment well   Behavior During Therapy [facility] for tasks assessed/performed      Past Medical History:  Diagnosis Date  . Abnormal thyroid function test 07/01/2013  . Arthritis   . Cancer ([place])   . Dermatitis 11/25/2013  . Dyslipidemia   . Glaucoma 11/02/2012  . HTN (hypertension) 11/02/2012  . 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 Hyperlipidemia   . Obesity     Past Surgical History:  Procedure Laterality Date  . BREAST SURGERY  10/2005   Left Mastectomy, reconstruction, reduction on right. Dr. Harlow Mares  . CRYOTHERAPY     GYN for CIN III  . DILATION AND CURETTAGE OF UTERUS    . EXCISION MORTON'S NEUROMA     right foot    There were no vitals filed for this visit.       Subjective Assessment - 06/27/16 0826    Subjective Pt presents to PT 5 weeks s/p Rt proximal humerus fracture sustained when she fell in her sister's driveway.  Pt has been wearing a sling since injury.     Pertinent History breast cancer: no [place], no UBE, no heat to Lt shoulder.  Rt humerus fracture: 05/16/16.   Diagnostic tests x-ray: proximal humerus fracture   Patient Stated Goals improve use of Rt UE, imporve A/ROM and strength,   Currently in Pain? Yes   Pain Score 5    Pain Location Shoulder   Pain Orientation Right   Pain Descriptors / Indicators Sore   Pain Type Acute pain   Pain Onset  More than a month ago   Pain Frequency Intermittent   Aggravating Factors  movement of Rt arm   Pain Relieving Factors no moving it, Tylenol            [facility] PT Assessment - 06/27/16 0001      Assessment   Medical Diagnosis s/p Rt proximal humerus fracture   Referring Provider Kathryne Hitch, MD   Onset Date/Surgical Date 05/19/16   Hand Dominance Right   Next MD Visit 07/17/16   Prior Therapy home exercises     Precautions   Precautions Shoulder;Other (comment)  history of breast cancer- no [place], heat to Rt shoulder or UBE   Type of Shoulder Precautions wear sling on Rt, no driving, gentle AROM   Required Braces or Orthoses Sling     Restrictions   Weight Bearing Restrictions No     Balance Screen   Has the patient fallen in the past 6 months Yes   How many times? 1  tripped on the driveway   Has the patient had a decrease in activity level because of a fear of falling?  No   Is the patient reluctant to leave their home because of a fear of falling?  No     Home Environment   Living Environment  Private residence   Type of [place]     Prior Function   Level of [place] Retired   Leisure church activities     Cognition   Overall Cognitive Status Within Functional Limits for tasks assessed     Observation/Other Assessments   Focus on Therapeutic Outcomes (FOTO)  52% limitaiton     Posture/Postural Control   Posture/Postural Control Postural limitations   Postural Limitations Rounded Shoulders;Forward head     ROM / Strength   AROM / PROM / Strength AROM;PROM;Strength     AROM   Overall AROM  Deficits   Overall AROM Comments Lt shoulder AROM is normal   AROM Assessment Site Shoulder   Right/Left Shoulder Right   Right Shoulder Flexion 65 Degrees   Right Shoulder ABduction 60 Degrees   Right Shoulder Internal Rotation --  Rt lateral buttock   Right Shoulder External Rotation --  to occiput     PROM   Overall PROM  Deficits    PROM Assessment Site Shoulder   Right/Left Shoulder Right   Right Shoulder Flexion 110 Degrees   Right Shoulder ABduction 73 Degrees   Right Shoulder Internal Rotation 30 Degrees   Right Shoulder External Rotation 40 Degrees     Strength   Overall Strength Deficits   Overall Strength Comments Rt shoulder tested in neutral 4/5 submax flexion and extension.  Lt shoulder 4+/5 throughout     Palpation   Palpation comment diffuse palpable tenderness over Rt deltoid and glenohumeral joint.  Pt with reduced joint mobility in all directions.     Transfers   Transfers Sit to Stand;Stand to Sit   Sit to Stand 7: Independent     Ambulation/Gait   Ambulation/Gait Yes   Ambulation Distance (Feet) 100 Feet   Gait Pattern Within Functional Limits                           PT Education - 06/27/16 [phone]    Education provided Yes   Education Details sling exercises, AAROM of Rt shoulder    Person(s) Educated Patient   Methods Explanation;Handout   Comprehension Verbalized understanding;Returned demonstration          PT Short Term Goals - 06/27/16 0843      PT SHORT TERM GOAL #1   Title be independent in initial HEP   Time 4   Period Weeks   Status New     PT SHORT TERM GOAL #2   Title demonstrate Rt shoulder A/ROM flexion to 100 degrees to improve overhead reaching   Time 4   Period Weeks   Status New     PT SHORT TERM GOAL #3   Title demonstrate 50 degrees P/ROM Rt shoulder ER to improve use of Rt UE   Time 4   Period Weeks   Status New     PT SHORT TERM GOAL #4   Title report < or = to 3/10 Rt shoulder pain with movement   Time 4   Period Weeks   Status New           PT Long Term Goals - 06/27/16 0755      PT LONG TERM GOAL #1   Title be independent in advanced HEP   Time 8   Period Weeks   Status New     PT LONG TERM GOAL #2   Title reduce FOTO to < or = to 36% limitation  Time 8   Period Weeks   Status New     PT LONG TERM GOAL  #3   Title demonstrate Rt shoulder A/ROM flexion to 115 degrees to improve overhead use   Time 8   Period Weeks   Status New     PT LONG TERM GOAL #4   Title wean from sling and report 50% use of Rt UE with ADLs and self-care   Time 8   Period Weeks   Status New     PT LONG TERM GOAL #5   Title demonstrate 4-/5 Rt shoulder strength thoughout to improve use with home tasks and safe driving   Time 8   Period Weeks   Status New     Additional Long Term Goals   Additional Long Term Goals Yes     PT LONG TERM GOAL #6   Title report  <or = to 2/10 Rt shoulder pain with use with home tasks and self-care   Time 8   Period Weeks   Status New               Plan - 06/27/16 [id number]    Clinical Impression Statement Pt is a Rt hand dominant female who presents to PT 5 weeks Rt proximal humerus fracture sustained during a fall.  No surgical intervention.  Pt has been immobilized in sling since injury.  Pt demonstrates limited Rt shoulder strength, A/ROM against gravity and P/ROM.  FOTO score is 52% limitation and pt reports 5/10 Rt shoulder pain with movement.  Pt will benefit from skilled PT for Rt shoulder ROM progression, gentle strength and pain management to return to regular use of Rt UE and driving.     Rehab Potential Good   PT Frequency 2x / week   PT Duration 8 weeks   PT Treatment/Interventions ADLs/Self Care Home Management;Cryotherapy;Electrical Stimulation;Functional mobility training;Moist Heat;Therapeutic activities;Therapeutic exercise;Neuromuscular re-education;Patient/family education;Passive range of motion;Manual techniques;Dry needling;Taping;Vasopneumatic Device   PT Next Visit Plan Gentle Rt shoulder ROM progression, gentle strength (isometrics), modalities as needed.   Consulted and Agree with Plan of Care Patient      Patient will benefit from skilled therapeutic intervention in order to improve the following deficits and impairments:  Postural dysfunction,  Decreased strength, Impaired flexibility, Pain, Decreased activity tolerance, Decreased endurance, Impaired UE functional use, Decreased range of motion  Visit Diagnosis: Acute pain of right shoulder - Plan: PT plan of care cert/re-cert  Stiffness of right shoulder, not elsewhere classified - Plan: PT plan of care cert/re-cert  Muscle weakness (generalized) - Plan: PT plan of care cert/re-cert      G-Codes - [id number]    Functional Assessment Tool Used FOTO: 52% limitation   Functional Limitation Other PT primary   Other PT Primary Current Status [id number]) At least 40 percent but less than 60 percent impaired, limited or restricted   Other PT Primary Goal Status [id number]) At least 20 percent but less than 40 percent impaired, limited or restricted       Problem List Patient Active Problem List   Diagnosis Date Noted  . Medicare annual wellness visit, subsequent 09/26/2014  . Hypothyroidism 07/01/2013  . Glaucoma 11/02/2012  . White coat hypertension 11/02/2012  . Vitamin D deficiency 11/02/2012  . Hyperlipidemia, mixed 07/24/2011  . Obesity (BMI 30-39.9) 07/24/2011  . hx: breast cancer, IDC (Microinvasive) w DCIS, 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 - 07/10/2011     Sigurd Sos, PT 06/27/16 8:48 AM  [facility] [address], [place], [place], [address] Phone: [phone]   Fax:  [phone]  Name: MARNETTA MONSMA MRN: [id number] Date of Birth: [birth year]

## 2016-06-27 NOTE — Patient Instructions (Addendum)
ROM: Pendulum (Circular)  Let right arm move in circle clockwise, then counterclockwise, by rocking body weight in circular pattern. Circle _10___ times each direction per set. Do _3___ sessions per day.  Pendulum Side to Side  Bend forward 90 at waist, leaning on table for support. Rock body from side to side and let arm swing freely. Repeat _10___ times. Do __3__ sessions per day.  Finger Flexors  Keeping right fingertips straight, press putty toward base of palm. Repeat __20__ times. Do __3__ sessions per day. Activity: Squeeze flour sifter, plastic squeeze bottles, [place] baster, juice from fruit.*  AROM: Elbow Flexion / Extension  With left hand palm up, gently bend elbow as far as possible. Then straighten arm as far as possible. Repeat __10__ times per set. Do __3__ sessions per day.  SHOULDER: Flexion On Table  Place hands on table, elbows straight. Move hips away from body. Press hands down into table. Hold _3__ seconds. _10__ reps per set, __3_ sets per day.  Elevation: Shrug (Distal Resist)  Also add circles forward and back   Lift shoulders straight up, then return. Maintain same speed up and down. Avoid moving head and neck forward. Repeat _10___ times per set. Do __1-2__ sets per session. Do many times daily. Copyright  VHI. All rights reserved.   Hold all stretches 5-10 seconds and perform 5-10 times, 3 times a day.   Sitting upright, slide forearm forward along table, bending from the waist until a stretch is felt. Copyright  VHI. All rights reserved.    Clasp hands together and raise arms above head, keeping elbows as straight as possible. Can be done sitting or lying.   Copyright  VHI. All rights reserved.    [facility] [address], [place], [place] [address] Phone # [phone] Fax [phone]

## 2016-06-29 ENCOUNTER — Ambulatory Visit: Payer: Medicare Other | Admitting: Physical Therapy

## 2016-06-29 DIAGNOSIS — M25511 Pain in right shoulder: Secondary | ICD-10-CM

## 2016-06-29 DIAGNOSIS — M25611 Stiffness of right shoulder, not elsewhere classified: Secondary | ICD-10-CM

## 2016-06-29 DIAGNOSIS — M6281 Muscle weakness (generalized): Secondary | ICD-10-CM

## 2016-06-29 NOTE — Therapy (Signed)
[facility] [address] [place], [place], [address] Phone: [phone]   Fax:  [phone]  Physical Therapy Treatment  Patient Details  Name: [name] MRN: [id number] Date of Birth: Jul 21, 1941 Referring Provider: Kathryne Hitch, MD  Encounter Date: 06/29/2016      PT End of Session - 06/29/16 0808    Visit Number 2   Number of Visits 10   Date for PT Re-Evaluation 08/22/16   PT Start Time [id number]   PT Stop Time 0841   PT Time Calculation (min) 46 min   Activity Tolerance Patient tolerated treatment well   Behavior During Therapy [facility] for tasks assessed/performed      Past Medical History:  Diagnosis Date  . Abnormal thyroid function test 07/01/2013  . Arthritis   . Cancer ([place])   . Dermatitis 11/25/2013  . Dyslipidemia   . Glaucoma 11/02/2012  . HTN (hypertension) 11/02/2012  . 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 Hyperlipidemia   . Obesity     Past Surgical History:  Procedure Laterality Date  . BREAST SURGERY  10/2005   Left Mastectomy, reconstruction, reduction on right. Dr. Harlow Mares  . CRYOTHERAPY     GYN for CIN III  . DILATION AND CURETTAGE OF UTERUS    . EXCISION MORTON'S NEUROMA     right foot    There were no vitals filed for this visit.      Subjective Assessment - 06/29/16 0802    Subjective Pt reports some pain in shoulder but feels more like muscle soreness. Has been compliant with home exercises.    Pertinent History breast cancer: no [place], no UBE, no heat to Lt shoulder.  Rt humerus fracture: 05/16/16.   Diagnostic tests x-ray: proximal humerus fracture   Patient Stated Goals improve use of Rt UE, imporve A/ROM and strength,   Currently in Pain? Yes   Pain Score 4    Pain Location Shoulder   Pain Orientation Right   Pain Descriptors / Indicators Sore   Pain Type Acute pain   Pain Onset More than a month ago   Pain Frequency  Intermittent                         [facility] Adult PT Treatment/Exercise - 06/29/16 0001      Exercises   Exercises Shoulder     Shoulder Exercises: Seated   Other Seated Exercises 3 direction table slides  flexion, abduction. external rotation     Shoulder Exercises: Pulleys   Flexion 2 minutes   ABduction 2 minutes     Manual Therapy   Manual Therapy Soft tissue mobilization;Passive ROM   Manual therapy comments Rt shoulder pt in supine   Soft tissue mobilization To posterior delt insertion, subascapularis, and mid delt   Passive ROM In all planes                PT Education - 06/29/16 0825    Education provided Yes   Education Details isometircs strenghtening   Person(s) Educated Patient   Methods Explanation;Demonstration;Handout   Comprehension Verbalized understanding          PT Short Term Goals - 06/27/16 0843      PT SHORT TERM GOAL #1   Title be independent in initial HEP   Time 4   Period Weeks   Status New     PT SHORT TERM GOAL #  2   Title demonstrate Rt shoulder A/ROM flexion to 100 degrees to improve overhead reaching   Time 4   Period Weeks   Status New     PT SHORT TERM GOAL #3   Title demonstrate 50 degrees P/ROM Rt shoulder ER to improve use of Rt UE   Time 4   Period Weeks   Status New     PT SHORT TERM GOAL #4   Title report < or = to 3/10 Rt shoulder pain with movement   Time 4   Period Weeks   Status New           PT Long Term Goals - 06/27/16 0755      PT LONG TERM GOAL #1   Title be independent in advanced HEP   Time 8   Period Weeks   Status New     PT LONG TERM GOAL #2   Title reduce FOTO to < or = to 36% limitation   Time 8   Period Weeks   Status New     PT LONG TERM GOAL #3   Title demonstrate Rt shoulder A/ROM flexion to 115 degrees to improve overhead use   Time 8   Period Weeks   Status New     PT LONG TERM GOAL #4   Title wean from sling and report 50% use of Rt UE with ADLs  and self-care   Time 8   Period Weeks   Status New     PT LONG TERM GOAL #5   Title demonstrate 4-/5 Rt shoulder strength thoughout to improve use with home tasks and safe driving   Time 8   Period Weeks   Status New     Additional Long Term Goals   Additional Long Term Goals Yes     PT LONG TERM GOAL #6   Title report  <or = to 2/10 Rt shoulder pain with use with home tasks and self-care   Time 8   Period Weeks   Status New               Plan - 06/29/16 0845    Clinical Impression Statement Pt has been adapting to using just one hand for ADL's. Has been compliant with home exercises and is performing regularly. Pt able to tolerate all AA/ROM exercises well. Very guarded with P/ROM but able to relax some with manaul soft tissue mobilization. Pt will continue to benefit from skilled therapy for shoulder strengthneing and ROM.    Rehab Potential Good   PT Frequency 2x / week   PT Duration 8 weeks   PT Treatment/Interventions ADLs/Self Care Home Management;Cryotherapy;Electrical Stimulation;Functional mobility training;Moist Heat;Therapeutic activities;Therapeutic exercise;Neuromuscular re-education;Patient/family education;Passive range of motion;Manual techniques;Dry needling;Taping;Vasopneumatic Device   PT Next Visit Plan Gentle Rt shoulder ROM progression, gentle strength (isometrics), modalities as needed.   Consulted and Agree with Plan of Care Patient      Patient will benefit from skilled therapeutic intervention in order to improve the following deficits and impairments:  Postural dysfunction, Decreased strength, Impaired flexibility, Pain, Decreased activity tolerance, Decreased endurance, Impaired UE functional use, Decreased range of motion  Visit Diagnosis: Acute pain of right shoulder  Stiffness of right shoulder, not elsewhere classified  Muscle weakness (generalized)     Problem List Patient Active Problem List   Diagnosis Date Noted  . Medicare  annual wellness visit, subsequent 09/26/2014  . Hypothyroidism 07/01/2013  . Glaucoma 11/02/2012  . White coat hypertension 11/02/2012  . Vitamin  D deficiency 11/02/2012  . Hyperlipidemia, mixed 07/24/2011  . Obesity (BMI 30-39.9) 07/24/2011  . hx: breast cancer, IDC (Microinvasive) w DCIS, 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 - 07/10/2011    Mikle Bosworth PTA 06/29/2016, 8:50 AM  [facility] [address] [place], [place], [address] Phone: [phone]   Fax:  [phone]  Name: [name] MRN: [id number] Date of Birth: 1940/09/15

## 2016-06-29 NOTE — Patient Instructions (Signed)
Strengthening: Isometric Flexion  Using wall for resistance, press right fist into ball using light pressure. Hold _2___ seconds. Repeat __10__ times per set. Do __1__ sets per session. Do __1__ sessions per day.   Extension (Isometric)  Place left bent elbow and back of arm against wall. Press elbow against wall. Hold _2___ seconds. Repeat _10___ times. Do __1__ sessions per day.  Internal Rotation (Isometric)  Place palm of right fist against door frame, with elbow bent. Press fist against door frame. Hold __2__ seconds. Repeat __10__ times. Do __1__ sessions per day.     Jeanie Sewer PTA [facility] [address] [place], [place] [address] Phone # [phone] Fax [phone]

## 2016-07-02 ENCOUNTER — Ambulatory Visit: Payer: Medicare Other | Admitting: Physical Therapy

## 2016-07-02 DIAGNOSIS — M25511 Pain in right shoulder: Secondary | ICD-10-CM | POA: Diagnosis not present

## 2016-07-02 DIAGNOSIS — M25611 Stiffness of right shoulder, not elsewhere classified: Secondary | ICD-10-CM

## 2016-07-02 DIAGNOSIS — M6281 Muscle weakness (generalized): Secondary | ICD-10-CM

## 2016-07-02 NOTE — Therapy (Signed)
[facility] [address], [place], [place], [address] Phone: [phone]   Fax:  [phone]  Physical Therapy Treatment  Patient Details  Name: [name] MRN: [id number] Date of Birth: 17-Aug-1941 Referring Provider: Kathryne Hitch, MD  Encounter Date: 07/02/2016      PT End of Session - 07/02/16 0959    Visit Number 3   Number of Visits 10   Date for PT Re-Evaluation 08/22/16   PT Start Time 0803   PT Stop Time 0845   PT Time Calculation (min) 42 min   Activity Tolerance Patient tolerated treatment well   Behavior During Therapy [facility] for tasks assessed/performed      Past Medical History:  Diagnosis Date  . Abnormal thyroid function test 07/01/2013  . Arthritis   . Cancer ([place])   . Dermatitis 11/25/2013  . Dyslipidemia   . Glaucoma 11/02/2012  . HTN (hypertension) 11/02/2012  . 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 Hyperlipidemia   . Obesity     Past Surgical History:  Procedure Laterality Date  . BREAST SURGERY  10/2005   Left Mastectomy, reconstruction, reduction on right. Dr. Harlow Mares  . CRYOTHERAPY     GYN for CIN III  . DILATION AND CURETTAGE OF UTERUS    . EXCISION MORTON'S NEUROMA     right foot    There were no vitals filed for this visit.      Subjective Assessment - 07/02/16 0808    Subjective Pt reports mild soreness, very motivated to get better.   Currently in Pain? Yes   Pain Score 3    Pain Location Shoulder   Pain Orientation Right   Pain Descriptors / Indicators Sore   Aggravating Factors  Movement   Pain Relieving Factors --  rest                         [facility] Adult PT Treatment/Exercise - 07/02/16 0001      Shoulder Exercises: Supine   Flexion --  AAROM with cane; flexed elbows 2x10   Theraband Level (Shoulder Flexion) --  AAROm with PTA assit into flexion 2x10     Shoulder Exercises: Seated   Other  Seated Exercises scap squeezes 2x10     Shoulder Exercises: Standing   Flexion --  Table slides 2x10   Other Standing Exercises Rainbow table slides 2x10     Shoulder Exercises: Pulleys   Flexion 3 minutes   ABduction 3 minutes   ABduction Limitations TC/ assistance to keep upper trap inhibited     Shoulder Exercises: Isometric Strengthening   Flexion --  10x 5 second holds   Extension 5X10"   Theraband Level (Extension) --  Pt with difficulty inhibiting upper trap.                PT Education - 07/02/16 0958    Education provided Yes   Education Details scap squeezes   Person(s) Educated Patient   Methods Explanation;Demonstration;Tactile cues;Verbal cues   Comprehension Verbalized understanding;Returned demonstration          PT Short Term Goals - 07/02/16 1003      PT SHORT TERM GOAL #1   Title be independent in initial HEP   Time 4   Period Weeks   Status Achieved           PT Long Term Goals - 06/27/16 [id number]  PT LONG TERM GOAL #1   Title be independent in advanced HEP   Time 8   Period Weeks   Status New     PT LONG TERM GOAL #2   Title reduce FOTO to < or = to 36% limitation   Time 8   Period Weeks   Status New     PT LONG TERM GOAL #3   Title demonstrate Rt shoulder A/ROM flexion to 115 degrees to improve overhead use   Time 8   Period Weeks   Status New     PT LONG TERM GOAL #4   Title wean from sling and report 50% use of Rt UE with ADLs and self-care   Time 8   Period Weeks   Status New     PT LONG TERM GOAL #5   Title demonstrate 4-/5 Rt shoulder strength thoughout to improve use with home tasks and safe driving   Time 8   Period Weeks   Status New     Additional Long Term Goals   Additional Long Term Goals Yes     PT LONG TERM GOAL #6   Title report  <or = to 2/10 Rt shoulder pain with use with home tasks and self-care   Time 8   Period Weeks   Status New               Plan - 07/02/16 0959     Clinical Impression Statement Pt demonstrated improved AAROM with the cane in supine throughout the session. Spent a lot of time educating pt in how to inhibit her upper trap when moving her arm.    Rehab Potential Good   PT Frequency 2x / week   PT Duration 8 weeks   PT Treatment/Interventions ADLs/Self Care Home Management;Cryotherapy;Electrical Stimulation;Functional mobility training;Moist Heat;Therapeutic activities;Therapeutic exercise;Neuromuscular re-education;Patient/family education;Passive range of motion;Manual techniques;Dry needling;Taping;Vasopneumatic Device   PT Next Visit Plan Gentle Rt shoulder ROM progression, gentle strength (isometrics), modalities as needed.   Consulted and Agree with Plan of Care Patient      Patient will benefit from skilled therapeutic intervention in order to improve the following deficits and impairments:  Postural dysfunction, Decreased strength, Impaired flexibility, Pain, Decreased activity tolerance, Decreased endurance, Impaired UE functional use, Decreased range of motion  Visit Diagnosis: Stiffness of right shoulder, not elsewhere classified  Muscle weakness (generalized)  Acute pain of right shoulder     Problem List Patient Active Problem List   Diagnosis Date Noted  . Medicare annual wellness visit, subsequent 09/26/2014  . Hypothyroidism 07/01/2013  . Glaucoma 11/02/2012  . White coat hypertension 11/02/2012  . Vitamin D deficiency 11/02/2012  . Hyperlipidemia, mixed 07/24/2011  . Obesity (BMI 30-39.9) 07/24/2011  . hx: breast cancer, IDC (Microinvasive) w DCIS, 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 - 07/10/2011    [name], PTA 07/02/2016, 10:05 AM  [facility] [address] [place], [place], [address] Phone: [phone]   Fax:  [phone]  Name: [name] MRN: [id number] Date of Birth: 03/27/1941

## 2016-07-04 ENCOUNTER — Ambulatory Visit: Payer: Medicare Other | Admitting: Physical Therapy

## 2016-07-04 ENCOUNTER — Encounter: Payer: Self-pay | Admitting: Physical Therapy

## 2016-07-04 DIAGNOSIS — M25511 Pain in right shoulder: Secondary | ICD-10-CM

## 2016-07-04 DIAGNOSIS — M25611 Stiffness of right shoulder, not elsewhere classified: Secondary | ICD-10-CM

## 2016-07-04 DIAGNOSIS — M6281 Muscle weakness (generalized): Secondary | ICD-10-CM

## 2016-07-04 NOTE — Therapy (Signed)
[facility] [address] [place], [place], [address] Phone: [phone]   Fax:  [phone]  Physical Therapy Treatment  Patient Details  Name: [name] MRN: [id number] Date of Birth: November 10, 1940 Referring Provider: Kathryne Hitch, MD  Encounter Date: 07/04/2016      PT End of Session - 07/04/16 1017    Visit Number 4   Number of Visits 10   Date for PT Re-Evaluation 08/22/16   PT Start Time 0930   PT Stop Time 1013   PT Time Calculation (min) 43 min   Activity Tolerance Patient tolerated treatment well   Behavior During Therapy [facility] for tasks assessed/performed      Past Medical History:  Diagnosis Date  . Abnormal thyroid function test 07/01/2013  . Arthritis   . Cancer ([place])   . Dermatitis 11/25/2013  . Dyslipidemia   . Glaucoma 11/02/2012  . HTN (hypertension) 11/02/2012  . 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 Hyperlipidemia   . Obesity     Past Surgical History:  Procedure Laterality Date  . BREAST SURGERY  10/2005   Left Mastectomy, reconstruction, reduction on right. Dr. Harlow Mares  . CRYOTHERAPY     GYN for CIN III  . DILATION AND CURETTAGE OF UTERUS    . EXCISION MORTON'S NEUROMA     right foot    There were no vitals filed for this visit.      Subjective Assessment - 07/04/16 0931    Subjective Pt reports some increased pain after last session from stretching. Has been takeing Tylenol for pain which has helped.    Pertinent History breast cancer: no [place], no UBE, no heat to Lt shoulder.  Rt humerus fracture: 05/16/16.   Diagnostic tests x-ray: proximal humerus fracture   Patient Stated Goals improve use of Rt UE, imporve A/ROM and strength,   Currently in Pain? Yes   Pain Score 5    Pain Location Shoulder   Pain Orientation Right   Pain Descriptors / Indicators Sore   Pain Type Acute pain   Pain Onset More than a month ago   Pain Frequency  Intermittent            [facility] PT Assessment - 07/04/16 0001      PROM   Overall PROM  Deficits   PROM Assessment Site Shoulder   Right/Left Shoulder Right   Right Shoulder Flexion 135 Degrees   Right Shoulder ABduction 108 Degrees   Right Shoulder Internal Rotation 65 Degrees   Right Shoulder External Rotation 70 Degrees                     [facility] Adult PT Treatment/Exercise - 07/04/16 0001      Shoulder Exercises: Supine   Flexion --  AAROM with cane; flexed elbows 2x10   Theraband Level (Shoulder Flexion) --  AAROm with PTA assit into flexion 2x10     Shoulder Exercises: Seated   Other Seated Exercises 3 direction table slides  flexion, abduction. external rotation   Other Seated Exercises Scap squeeze  tactile cues for proper movement     Shoulder Exercises: Standing   Other Standing Exercises Rainbow table slides 2x10     Shoulder Exercises: Pulleys   Flexion 3 minutes   ABduction 3 minutes     Shoulder Exercises: Isometric Strengthening   Flexion 5X10"   Extension 5X10"   External Rotation 5X10"   ABduction  5X10"   ADduction 5X10"     Manual Therapy   Manual Therapy Soft tissue mobilization;Passive ROM   Manual therapy comments Rt shoulder pt in supine   Soft tissue mobilization To posterior delt insertion, subascapularis, and mid delt   Passive ROM In all planes                  PT Short Term Goals - 07/02/16 1003      PT SHORT TERM GOAL #1   Title be independent in initial HEP   Time 4   Period Weeks   Status Achieved           PT Long Term Goals - 06/27/16 0755      PT LONG TERM GOAL #1   Title be independent in advanced HEP   Time 8   Period Weeks   Status New     PT LONG TERM GOAL #2   Title reduce FOTO to < or = to 36% limitation   Time 8   Period Weeks   Status New     PT LONG TERM GOAL #3   Title demonstrate Rt shoulder A/ROM flexion to 115 degrees to improve overhead use   Time 8   Period Weeks    Status New     PT LONG TERM GOAL #4   Title wean from sling and report 50% use of Rt UE with ADLs and self-care   Time 8   Period Weeks   Status New     PT LONG TERM GOAL #5   Title demonstrate 4-/5 Rt shoulder strength thoughout to improve use with home tasks and safe driving   Time 8   Period Weeks   Status New     Additional Long Term Goals   Additional Long Term Goals Yes     PT LONG TERM GOAL #6   Title report  <or = to 2/10 Rt shoulder pain with use with home tasks and self-care   Time 8   Period Weeks   Status New               Plan - 07/04/16 0939    Clinical Impression Statement Pt continues to have some difficulty inhibiting upper trap to perform shoulder movements needing a lot of verbal and tactile cueing to perform scapular squeeze and shoulder isometrics properly. Pt has improve with P/ROM measurments in all planes. Pt will continue to benefit from skilled therapy for shoulder strenght and ROM.    Rehab Potential Good   PT Frequency 2x / week   PT Duration 8 weeks   PT Treatment/Interventions ADLs/Self Care Home Management;Cryotherapy;Electrical Stimulation;Functional mobility training;Moist Heat;Therapeutic activities;Therapeutic exercise;Neuromuscular re-education;Patient/family education;Passive range of motion;Manual techniques;Dry needling;Taping;Vasopneumatic Device   PT Next Visit Plan Gentle Rt shoulder ROM progression, gentle strength (isometrics), modalities as needed.   Consulted and Agree with Plan of Care Patient      Patient will benefit from skilled therapeutic intervention in order to improve the following deficits and impairments:  Postural dysfunction, Decreased strength, Impaired flexibility, Pain, Decreased activity tolerance, Decreased endurance, Impaired UE functional use, Decreased range of motion  Visit Diagnosis: Stiffness of right shoulder, not elsewhere classified  Muscle weakness (generalized)  Acute pain of right  shoulder     Problem List Patient Active Problem List   Diagnosis Date Noted  . Medicare annual wellness visit, subsequent 09/26/2014  . Hypothyroidism 07/01/2013  . Glaucoma 11/02/2012  . White coat hypertension 11/02/2012  . Vitamin D deficiency  11/02/2012  . Hyperlipidemia, mixed 07/24/2011  . Obesity (BMI 30-39.9) 07/24/2011  . hx: breast cancer, IDC (Microinvasive) w DCIS, 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 - 07/10/2011    Mikle Bosworth PTA 07/04/2016, 10:45 AM  [facility] [address], [place], [place], [address] Phone: [phone]   Fax:  [phone]  Name: [name] MRN: [id number] Date of Birth: 07-Oct-1940

## 2016-07-05 ENCOUNTER — Encounter: Payer: Self-pay | Admitting: Family Medicine

## 2016-07-09 ENCOUNTER — Ambulatory Visit: Payer: Medicare Other | Admitting: Physical Therapy

## 2016-07-09 ENCOUNTER — Encounter: Payer: Self-pay | Admitting: Physical Therapy

## 2016-07-09 DIAGNOSIS — M6281 Muscle weakness (generalized): Secondary | ICD-10-CM

## 2016-07-09 DIAGNOSIS — M25611 Stiffness of right shoulder, not elsewhere classified: Secondary | ICD-10-CM

## 2016-07-09 DIAGNOSIS — M25511 Pain in right shoulder: Secondary | ICD-10-CM

## 2016-07-09 NOTE — Therapy (Signed)
[facility] [address], [place], [place], [address] Phone: [phone]   Fax:  [phone]  Physical Therapy Treatment  Patient Details  Name: [name] MRN: [id number] Date of Birth: 1940/12/15 Referring Provider: Kathryne Hitch, MD  Encounter Date: 07/09/2016      PT End of Session - 07/09/16 0804    Visit Number 5   Number of Visits 10   Date for PT Re-Evaluation 08/22/16   PT Start Time 0800   PT Stop Time 0846   PT Time Calculation (min) 46 min   Activity Tolerance Patient tolerated treatment well   Behavior During Therapy [facility] for tasks assessed/performed      Past Medical History:  Diagnosis Date  . Abnormal thyroid function test 07/01/2013  . Arthritis   . Cancer ([place])   . Dermatitis 11/25/2013  . Dyslipidemia   . Glaucoma 11/02/2012  . HTN (hypertension) 11/02/2012  . 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 Hyperlipidemia   . Obesity     Past Surgical History:  Procedure Laterality Date  . BREAST SURGERY  10/2005   Left Mastectomy, reconstruction, reduction on right. Dr. Harlow Mares  . CRYOTHERAPY     GYN for CIN III  . DILATION AND CURETTAGE OF UTERUS    . EXCISION MORTON'S NEUROMA     right foot    There were no vitals filed for this visit.      Subjective Assessment - 07/09/16 0803    Subjective Pt was sore after stretching at ne dof last visit but feeling ok today.    Pertinent History breast cancer: no [place], no UBE, no heat to Lt shoulder.  Rt humerus fracture: 05/16/16.   Diagnostic tests x-ray: proximal humerus fracture   Patient Stated Goals improve use of Rt UE, imporve A/ROM and strength,   Currently in Pain? Yes   Pain Score 4    Pain Location Shoulder   Pain Orientation Right   Pain Descriptors / Indicators Sore   Pain Onset More than a month ago   Pain Frequency Intermittent                         [facility] Adult  PT Treatment/Exercise - 07/09/16 0001      Shoulder Exercises: Seated   Other Seated Exercises 3 direction table slides  flexion, abduction. external rotation, using ball   Other Seated Exercises Scap squeeze  tactile cues for proper movement     Shoulder Exercises: Standing   Other Standing Exercises Rainbow table slides 2x10     Shoulder Exercises: Pulleys   Flexion 3 minutes   ABduction 3 minutes     Shoulder Exercises: Isometric Strengthening   Flexion 5X10"   Extension 5X10"   External Rotation 5X10"   ABduction 5X10"   ADduction 5X10"     Manual Therapy   Manual Therapy Soft tissue mobilization;Passive ROM   Manual therapy comments Rt shoulder pt in supine   Soft tissue mobilization To posterior delt insertion, subascapularis, and mid delt   Passive ROM In all planes                  PT Short Term Goals - 07/09/16 0805      PT SHORT TERM GOAL #1   Title be independent in initial HEP   Time 4   Period Weeks   Status Achieved     PT  SHORT TERM GOAL #2   Title demonstrate Rt shoulder A/ROM flexion to 100 degrees to improve overhead reaching   Time 4   Period Weeks   Status On-going     PT SHORT TERM GOAL #3   Title demonstrate 50 degrees P/ROM Rt shoulder ER to improve use of Rt UE   Time 4   Period Weeks   Status On-going     PT SHORT TERM GOAL #4   Title report < or = to 3/10 Rt shoulder pain with movement   Time 4   Period Weeks   Status On-going           PT Long Term Goals - 07/09/16 [id number]      PT LONG TERM GOAL #1   Title be independent in advanced HEP   Time 8   Period Weeks   Status On-going     PT LONG TERM GOAL #3   Title demonstrate Rt shoulder A/ROM flexion to 115 degrees to improve overhead use   Time 8   Period Weeks   Status On-going     PT LONG TERM GOAL #4   Title wean from sling and report 50% use of Rt UE with ADLs and self-care   Time 8   Period Weeks   Status On-going     PT LONG TERM GOAL #5   Title  demonstrate 4-/5 Rt shoulder strength thoughout to improve use with home tasks and safe driving   Time 8   Period Weeks   Status On-going     PT LONG TERM GOAL #6   Title report  <or = to 2/10 Rt shoulder pain with use with home tasks and self-care   Time 8   Period Weeks   Status On-going               Plan - 07/09/16 0908    Clinical Impression Statement Pt continues to have tightness in right shoulder limiting ROM. Pt able to relax upper traps better today with all scapular and isometric exercises. Pt will continue to benefit from skilled therapy for shoulder ROM and strenghtening.    Rehab Potential Good   PT Frequency 2x / week   PT Duration 8 weeks   PT Treatment/Interventions ADLs/Self Care Home Management;Cryotherapy;Electrical Stimulation;Functional mobility training;Moist Heat;Therapeutic activities;Therapeutic exercise;Neuromuscular re-education;Patient/family education;Passive range of motion;Manual techniques;Dry needling;Taping;Vasopneumatic Device   PT Next Visit Plan Gentle Rt shoulder ROM progression, gentle strength (isometrics), modalities as needed.   Consulted and Agree with Plan of Care Patient      Patient will benefit from skilled therapeutic intervention in order to improve the following deficits and impairments:  Postural dysfunction, Decreased strength, Impaired flexibility, Pain, Decreased activity tolerance, Decreased endurance, Impaired UE functional use, Decreased range of motion  Visit Diagnosis: Stiffness of right shoulder, not elsewhere classified  Muscle weakness (generalized)  Acute pain of right shoulder     Problem List Patient Active Problem List   Diagnosis Date Noted  . Medicare annual wellness visit, subsequent 09/26/2014  . Hypothyroidism 07/01/2013  . Glaucoma 11/02/2012  . White coat hypertension 11/02/2012  . Vitamin D deficiency 11/02/2012  . Hyperlipidemia, mixed 07/24/2011  . Obesity (BMI 30-39.9) 07/24/2011  . hx:  breast cancer, IDC (Microinvasive) w DCIS, 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 - 07/10/2011    Mikle Bosworth PTA 07/09/2016, 9:10 AM  [facility] [address] [place], [place], [address] Phone: [phone]   Fax:  [phone]  Name: Alexandra  CODIE Fowler MRN: [id number] Date of Birth: [birth year]

## 2016-07-11 ENCOUNTER — Ambulatory Visit: Payer: Medicare Other | Attending: Orthopedic Surgery | Admitting: Physical Therapy

## 2016-07-11 ENCOUNTER — Encounter: Payer: Self-pay | Admitting: Physical Therapy

## 2016-07-11 DIAGNOSIS — M25611 Stiffness of right shoulder, not elsewhere classified: Secondary | ICD-10-CM | POA: Insufficient documentation

## 2016-07-11 DIAGNOSIS — M25511 Pain in right shoulder: Secondary | ICD-10-CM | POA: Insufficient documentation

## 2016-07-11 DIAGNOSIS — M6281 Muscle weakness (generalized): Secondary | ICD-10-CM

## 2016-07-11 NOTE — Patient Instructions (Signed)
Cane Overhead - Supine  Hold cane at thighs with both hands, extend arms straight over head. Hold _3__ seconds. Repeat __10_ times. Do __3_ times per day.  You can start this exercise by just doing a push up with the cane: This would be a good way to warm up before doing the above picture.   External Rotation (Eccentric), Active-Assist - Supine (Cane)  Lie on back, affected arm out from side, elbow at 90, forearm forward. Use cane to assist in lifting forearm of affected arm to neutral. Slowly lower for 3-5 seconds. _5__ reps per set, _2-3__ sets per day,   Copyright  VHI. All rights reserved.

## 2016-07-11 NOTE — Therapy (Signed)
[facility] [address] [place], [place], [address] Phone: [phone]   Fax:  [phone]  Physical Therapy Treatment  Patient Details  Name: [name] MRN: [id number] Date of Birth: 10/08/1940 Referring Provider: Kathryne Hitch, MD  Encounter Date: 07/11/2016      PT End of Session - 07/11/16 0803    Visit Number 6   Number of Visits 10   Date for PT Re-Evaluation 08/22/16   PT Start Time 0801   PT Stop Time 0846   PT Time Calculation (min) 45 min   Activity Tolerance Patient tolerated treatment well   Behavior During Therapy [facility] for tasks assessed/performed      Past Medical History:  Diagnosis Date  . Abnormal thyroid function test 07/01/2013  . Arthritis   . Cancer ([place])   . Dermatitis 11/25/2013  . Dyslipidemia   . Glaucoma 11/02/2012  . HTN (hypertension) 11/02/2012  . 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 Hyperlipidemia   . Obesity     Past Surgical History:  Procedure Laterality Date  . BREAST SURGERY  10/2005   Left Mastectomy, reconstruction, reduction on right. Dr. Harlow Mares  . CRYOTHERAPY     GYN for CIN III  . DILATION AND CURETTAGE OF UTERUS    . EXCISION MORTON'S NEUROMA     right foot    There were no vitals filed for this visit.      Subjective Assessment - 07/11/16 0804    Subjective Mainly pain with stretching, at rest no pain. Seeing the MD next week.    Currently in Pain? No/denies   Pain Location Shoulder   Pain Orientation Right   Pain Descriptors / Indicators Sore   Aggravating Factors  When moving   Pain Relieving Factors rest   Multiple Pain Sites No            [facility] PT Assessment - 07/11/16 0001      PROM   PROM Assessment Site Shoulder   Right/Left Shoulder Right   Right Shoulder Flexion 140 Degrees   Right Shoulder ABduction 95 Degrees   Right Shoulder External Rotation 70 Degrees                      [facility] Adult PT Treatment/Exercise - 07/11/16 0001      Shoulder Exercises: Supine   Flexion --  AAROM with cane; flexed elbows 2x10     Shoulder Exercises: Sidelying   ABduction AAROM;Right;20 reps     Shoulder Exercises: Pulleys   Flexion 3 minutes   ABduction 3 minutes     Shoulder Exercises: Isometric Strengthening   Flexion 5X10"  VC to not move body   Extension 5X10"  TC to facilitate correct muscle   External Rotation 5X10"   ABduction 5X10"   ADduction 5X10"  TC to facilitate proper muscle     Electrical Stimulation   Electrical Stimulation Location Rt shoulder/arm   Electrical Stimulation Action IFC   Electrical Stimulation Parameters Concurrent with MAnual PT   Electrical Stimulation Goals Pain     Manual Therapy   Manual Therapy --   Manual therapy comments Rt shoulder pt in supine   Passive ROM In all planes                PT Education - 07/11/16 0818    Education provided Yes   Education Details Supine cane exs for HEP  Person(s) Educated Patient   Methods Explanation;Demonstration;Tactile cues;Verbal cues;Handout   Comprehension Verbalized understanding;Returned demonstration          PT Short Term Goals - 07/09/16 0805      PT SHORT TERM GOAL #1   Title be independent in initial HEP   Time 4   Period Weeks   Status Achieved     PT SHORT TERM GOAL #2   Title demonstrate Rt shoulder A/ROM flexion to 100 degrees to improve overhead reaching   Time 4   Period Weeks   Status On-going     PT SHORT TERM GOAL #3   Title demonstrate 50 degrees P/ROM Rt shoulder ER to improve use of Rt UE   Time 4   Period Weeks   Status On-going     PT SHORT TERM GOAL #4   Title report < or = to 3/10 Rt shoulder pain with movement   Time 4   Period Weeks   Status On-going           PT Long Term Goals - 07/09/16 [id number]      PT LONG TERM GOAL #1   Title be independent in advanced HEP   Time 8   Period Weeks    Status On-going     PT LONG TERM GOAL #3   Title demonstrate Rt shoulder A/ROM flexion to 115 degrees to improve overhead use   Time 8   Period Weeks   Status On-going     PT LONG TERM GOAL #4   Title wean from sling and report 50% use of Rt UE with ADLs and self-care   Time 8   Period Weeks   Status On-going     PT LONG TERM GOAL #5   Title demonstrate 4-/5 Rt shoulder strength thoughout to improve use with home tasks and safe driving   Time 8   Period Weeks   Status On-going     PT LONG TERM GOAL #6   Title report  <or = to 2/10 Rt shoulder pain with use with home tasks and self-care   Time 8   Period Weeks   Status On-going               Plan - 07/11/16 0805    Clinical Impression Statement Pt had less pain using the Estim concurrent with PROM and stretching. Pt continues to improve her PROM. Pt is hoping to get out of her sling when she sees the MD.    Rehab Potential Good   PT Frequency 2x / week   PT Duration 8 weeks   PT Treatment/Interventions ADLs/Self Care Home Management;Cryotherapy;Electrical Stimulation;Functional mobility training;Moist Heat;Therapeutic activities;Therapeutic exercise;Neuromuscular re-education;Patient/family education;Passive range of motion;Manual techniques;Dry needling;Taping;Vasopneumatic Device   PT Next Visit Plan Gentle Rt shoulder ROM progression, gentle strength (isometrics), modalities as needed. Proceed per MD.    Consulted and Agree with Plan of Care Patient      Patient will benefit from skilled therapeutic intervention in order to improve the following deficits and impairments:  Postural dysfunction, Decreased strength, Impaired flexibility, Pain, Decreased activity tolerance, Decreased endurance, Impaired UE functional use, Decreased range of motion  Visit Diagnosis: Stiffness of right shoulder, not elsewhere classified  Muscle weakness (generalized)  Acute pain of right shoulder     Problem List Patient  Active Problem List   Diagnosis Date Noted  . Medicare annual wellness visit, subsequent 09/26/2014  . Hypothyroidism 07/01/2013  . Glaucoma 11/02/2012  . White coat hypertension 11/02/2012  .  Vitamin D deficiency 11/02/2012  . Hyperlipidemia, mixed 07/24/2011  . Obesity (BMI 30-39.9) 07/24/2011  . hx: breast cancer, IDC (Microinvasive) w DCIS, 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 - 07/10/2011    [name], PTA 07/11/2016, 8:51 AM  [facility] [address], [place], [place], [address] Phone: [phone]   Fax:  [phone]  Name: [name] MRN: [id number] Date of Birth: 1941/04/11

## 2016-07-16 ENCOUNTER — Encounter: Payer: Self-pay | Admitting: Physical Therapy

## 2016-07-16 ENCOUNTER — Ambulatory Visit: Payer: Medicare Other | Admitting: Physical Therapy

## 2016-07-16 DIAGNOSIS — M25611 Stiffness of right shoulder, not elsewhere classified: Secondary | ICD-10-CM

## 2016-07-16 DIAGNOSIS — M25511 Pain in right shoulder: Secondary | ICD-10-CM

## 2016-07-16 DIAGNOSIS — M6281 Muscle weakness (generalized): Secondary | ICD-10-CM

## 2016-07-16 NOTE — Therapy (Signed)
[facility] [address], [place], [place], [address] Phone: [phone]   Fax:  [phone]  Physical Therapy Treatment  Patient Details  Name: SHELBIA SCINTO MRN: [id number] Date of Birth: 1941-06-26 Referring Provider: Kathryne Hitch, MD  Encounter Date: 07/16/2016      PT End of Session - 07/16/16 0806    Visit Number 7   Number of Visits 10   Date for PT Re-Evaluation 08/22/16   PT Start Time 0802   PT Stop Time 0850   PT Time Calculation (min) 48 min   Activity Tolerance Patient tolerated treatment well   Behavior During Therapy [facility] for tasks assessed/performed      Past Medical History:  Diagnosis Date  . Abnormal thyroid function test 07/01/2013  . Arthritis   . Cancer ([place])   . Dermatitis 11/25/2013  . Dyslipidemia   . Glaucoma 11/02/2012  . HTN (hypertension) 11/02/2012  . 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 Hyperlipidemia   . Obesity     Past Surgical History:  Procedure Laterality Date  . BREAST SURGERY  10/2005   Left Mastectomy, reconstruction, reduction on right. Dr. Harlow Mares  . CRYOTHERAPY     GYN for CIN III  . DILATION AND CURETTAGE OF UTERUS    . EXCISION MORTON'S NEUROMA     right foot    There were no vitals filed for this visit.      Subjective Assessment - 07/16/16 0805    Subjective Seeing MD tomorrow. Liked doing the PROM with ESTIm, less soreness.   Currently in Pain? No/denies   Multiple Pain Sites No            [facility] PT Assessment - 07/16/16 0001      AROM   Right Shoulder Flexion 90 Degrees  Some upper trap involvement   Right Shoulder Internal Rotation --  Reaching behind the back     PROM   Right Shoulder Flexion 140 Degrees   Right Shoulder ABduction 95 Degrees   Right Shoulder External Rotation 70 Degrees                     [facility] Adult PT Treatment/Exercise - 07/16/16 0001      Shoulder  Exercises: Supine   Horizontal ABduction Weight (lbs) AA/ROM with cane 20x    Flexion --  AAROM with cane; flexed elbows 2x10   Shoulder Flexion Weight (lbs) Then 20x overhead with elbows extended     Shoulder Exercises: Prone   Other Prone Exercises Bent over chair: pendulums 20x, then 2x10 rows     Shoulder Exercises: Sidelying   External Rotation AROM;Right;20 reps   ABduction AROM;Right;20 reps     Shoulder Exercises: Standing   Flexion --  Finger ladder for flexion 10x     Shoulder Exercises: Pulleys   Flexion 3 minutes   ABduction 3 minutes     Electrical Stimulation   Electrical Stimulation Location Rt shoulder/arm   Electrical Stimulation Action IFC   Electrical Stimulation Parameters Concurrent with P/ROM   Electrical Stimulation Goals Pain     Manual Therapy   Manual therapy comments Rt shoulder pt in supine   Passive ROM In all planes                PT Education - 07/16/16 0630    Education provided Yes   Education Details Sidelying abduction for HEP    Person(s) Educated  Patient   Methods Explanation;Demonstration;Tactile cues;Verbal cues;Handout   Comprehension Verbalized understanding;Returned demonstration          PT Short Term Goals - 07/16/16 0859      PT SHORT TERM GOAL #1   Title be independent in initial HEP   Time 4   Period Weeks   Status Achieved     PT SHORT TERM GOAL #2   Title demonstrate Rt shoulder A/ROM flexion to 100 degrees to improve overhead reaching   Time 4   Period Weeks   Status Partially Met  With cane in supine pt reaches abour 100 degrees but not in sitting     PT SHORT TERM GOAL #3   Title demonstrate 50 degrees P/ROM Rt shoulder ER to improve use of Rt UE   Time 4   Period Weeks   Status Achieved     PT SHORT TERM GOAL #4   Title report < or = to 3/10 Rt shoulder pain with movement   Time 4   Period Weeks   Status Partially Met  Not really doing too much movement, but below 90 not much pain at  all.            PT Long Term Goals - 07/09/16 0806      PT LONG TERM GOAL #1   Title be independent in advanced HEP   Time 8   Period Weeks   Status On-going     PT LONG TERM GOAL #3   Title demonstrate Rt shoulder A/ROM flexion to 115 degrees to improve overhead use   Time 8   Period Weeks   Status On-going     PT LONG TERM GOAL #4   Title wean from sling and report 50% use of Rt UE with ADLs and self-care   Time 8   Period Weeks   Status On-going     PT LONG TERM GOAL #5   Title demonstrate 4-/5 Rt shoulder strength thoughout to improve use with home tasks and safe driving   Time 8   Period Weeks   Status On-going     PT LONG TERM GOAL #6   Title report  <or = to 2/10 Rt shoulder pain with use with home tasks and self-care   Time 8   Period Weeks   Status On-going               Plan - 07/16/16 0806    Clinical Impression Statement Pt has met all STGs this week. She continues to use sling during the day but sparingly. A/AA/PROM all improving since eval with not much pain to speak of.    Rehab Potential Good   PT Frequency 2x / week   PT Duration 8 weeks   PT Treatment/Interventions ADLs/Self Care Home Management;Cryotherapy;Electrical Stimulation;Functional mobility training;Moist Heat;Therapeutic activities;Therapeutic exercise;Neuromuscular re-education;Patient/family education;Passive range of motion;Manual techniques;Dry needling;Taping;Vasopneumatic Device   PT Next Visit Plan See what MD syas, sees MD tomorrow.    Consulted and Agree with Plan of Care Patient      Patient will benefit from skilled therapeutic intervention in order to improve the following deficits and impairments:  Postural dysfunction, Decreased strength, Impaired flexibility, Pain, Decreased activity tolerance, Decreased endurance, Impaired UE functional use, Decreased range of motion  Visit Diagnosis: Stiffness of right shoulder, not elsewhere classified  Muscle weakness  (generalized)  Acute pain of right shoulder     Problem List Patient Active Problem List   Diagnosis Date Noted  . Medicare annual  wellness visit, subsequent 09/26/2014  . Hypothyroidism 07/01/2013  . Glaucoma 11/02/2012  . White coat hypertension 11/02/2012  . Vitamin D deficiency 11/02/2012  . Hyperlipidemia, mixed 07/24/2011  . Obesity (BMI 30-39.9) 07/24/2011  . hx: breast cancer, IDC (Microinvasive) w DCIS, 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 - 07/10/2011    Joscelyne Renville, PTA 07/16/2016, 9:04 AM  [facility] [address] [place], [place], [address] Phone: [phone]   Fax:  [phone]  Name: DEARA BOBER MRN: [id number] Date of Birth: [birth year]

## 2016-07-16 NOTE — Patient Instructions (Signed)
Add to HEP:  1. Lay on your left side, right arm is straight supported on your hip, lift the arm up towards your ear with a straight elbow. Do 3 x10  3x day

## 2016-07-18 ENCOUNTER — Encounter: Payer: Self-pay | Admitting: Physical Therapy

## 2016-07-18 ENCOUNTER — Ambulatory Visit: Payer: Medicare Other | Admitting: Physical Therapy

## 2016-07-18 DIAGNOSIS — M25611 Stiffness of right shoulder, not elsewhere classified: Secondary | ICD-10-CM | POA: Diagnosis not present

## 2016-07-18 DIAGNOSIS — M6281 Muscle weakness (generalized): Secondary | ICD-10-CM

## 2016-07-18 DIAGNOSIS — M25511 Pain in right shoulder: Secondary | ICD-10-CM

## 2016-07-18 NOTE — Therapy (Signed)
[facility] [address] [place], [place], [address] Phone: [phone]   Fax:  [phone]  Physical Therapy Treatment  Patient Details  Name: [name] MRN: [id number] Date of Birth: 1940/10/18 Referring Provider: Kathryne Hitch, MD  Encounter Date: 07/18/2016      PT End of Session - 07/18/16 0802    Visit Number 8   Number of Visits 10   Date for PT Re-Evaluation 08/22/16   PT Start Time 0801   PT Stop Time 0845   PT Time Calculation (min) 44 min   Activity Tolerance Patient tolerated treatment well   Behavior During Therapy [facility] for tasks assessed/performed      Past Medical History:  Diagnosis Date  . Abnormal thyroid function test 07/01/2013  . Arthritis   . Cancer ([place])   . Dermatitis 11/25/2013  . Dyslipidemia   . Glaucoma 11/02/2012  . HTN (hypertension) 11/02/2012  . 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 Hyperlipidemia   . Obesity     Past Surgical History:  Procedure Laterality Date  . BREAST SURGERY  10/2005   Left Mastectomy, reconstruction, reduction on right. Dr. Harlow Mares  . CRYOTHERAPY     GYN for CIN III  . DILATION AND CURETTAGE OF UTERUS    . EXCISION MORTON'S NEUROMA     right foot    There were no vitals filed for this visit.      Subjective Assessment - 07/18/16 0803    Subjective Had an xray, healing very slowly with significant risk of re-break that require surgery. She must stay in sling for 4 more weeks, no driving.  Continue with gentle ROM exercises.    Currently in Pain? No/denies   Multiple Pain Sites No                         [facility] Adult PT Treatment/Exercise - 07/18/16 0001      Shoulder Exercises: Supine   Horizontal ABduction Weight (lbs) AA/ROM with cane 20x    External Rotation AAROM;Right;20 reps   Theraband Level (Shoulder External Rotation) --  with cane   Flexion --  AAROM with cane;  flexed elbows 3x10   Shoulder Flexion Weight (lbs) Then 20x overhead with elbows extended   Other Supine Exercises scap squeezes 10x      Shoulder Exercises: Seated   Other Seated Exercises AA forward flexion with PTA assist 3x 10     Shoulder Exercises: Standing   Flexion --  Finger ladder for flexion 10x     Shoulder Exercises: Pulleys   Flexion 3 minutes   ABduction 3 minutes     Electrical Stimulation   Electrical Stimulation Location Could not stay bc of ride today so she declined     Manual Therapy   Soft tissue mobilization RT upper trap                  PT Short Term Goals - 07/16/16 0859      PT SHORT TERM GOAL #1   Title be independent in initial HEP   Time 4   Period Weeks   Status Achieved     PT SHORT TERM GOAL #2   Title demonstrate Rt shoulder A/ROM flexion to 100 degrees to improve overhead reaching   Time 4   Period Weeks   Status Partially Met  With cane in supine pt reaches abour 100 degrees  but not in sitting     PT SHORT TERM GOAL #3   Title demonstrate 50 degrees P/ROM Rt shoulder ER to improve use of Rt UE   Time 4   Period Weeks   Status Achieved     PT SHORT TERM GOAL #4   Title report < or = to 3/10 Rt shoulder pain with movement   Time 4   Period Weeks   Status Partially Met  Not really doing too much movement, but below 90 not much pain at all.            PT Long Term Goals - 07/09/16 0806      PT LONG TERM GOAL #1   Title be independent in advanced HEP   Time 8   Period Weeks   Status On-going     PT LONG TERM GOAL #3   Title demonstrate Rt shoulder A/ROM flexion to 115 degrees to improve overhead use   Time 8   Period Weeks   Status On-going     PT LONG TERM GOAL #4   Title wean from sling and report 50% use of Rt UE with ADLs and self-care   Time 8   Period Weeks   Status On-going     PT LONG TERM GOAL #5   Title demonstrate 4-/5 Rt shoulder strength thoughout to improve use with home tasks and safe  driving   Time 8   Period Weeks   Status On-going     PT LONG TERM GOAL #6   Title report  <or = to 2/10 Rt shoulder pain with use with home tasks and self-care   Time 8   Period Weeks   Status On-going               Plan - 07/18/16 0802    Clinical Impression Statement After seeing MD yesterday pt has been instructed to wear sling 4 more weeks due to slow healing. Xray finally showed a small amount of bone growth but did not sound like much.  Pain still at a minimun and pt continues to do well with her ROM exercises. Progress will be slow per instructions to stay the course of ROM only.     Rehab Potential Good   PT Frequency 2x / week   PT Duration 8 weeks   PT Treatment/Interventions ADLs/Self Care Home Management;Cryotherapy;Electrical Stimulation;Functional mobility training;Moist Heat;Therapeutic activities;Therapeutic exercise;Neuromuscular re-education;Patient/family education;Passive range of motion;Manual techniques;Dry needling;Taping;Vasopneumatic Device   PT Next Visit Plan A/AA/PROM exercises, pt may have time for ESTIM next, just depends on her transportation.    Consulted and Agree with Plan of Care --      Patient will benefit from skilled therapeutic intervention in order to improve the following deficits and impairments:  Postural dysfunction, Decreased strength, Impaired flexibility, Pain, Decreased activity tolerance, Decreased endurance, Impaired UE functional use, Decreased range of motion  Visit Diagnosis: Stiffness of right shoulder, not elsewhere classified  Muscle weakness (generalized)  Acute pain of right shoulder     Problem List Patient Active Problem List   Diagnosis Date Noted  . Medicare annual wellness visit, subsequent 09/26/2014  . Hypothyroidism 07/01/2013  . Glaucoma 11/02/2012  . White coat hypertension 11/02/2012  . Vitamin D deficiency 11/02/2012  . Hyperlipidemia, mixed 07/24/2011  . Obesity (BMI 30-39.9) 07/24/2011  .  hx: breast cancer, IDC (Microinvasive) w DCIS, 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 - 07/10/2011    Layken Doenges, PTA 07/18/2016, 10:03 AM  [facility] [address], [place], [place], [address] Phone: [phone]   Fax:  [phone]  Name: [name] MRN: [id number] Date of Birth: [birth year]

## 2016-07-24 ENCOUNTER — Ambulatory Visit: Payer: Medicare Other | Admitting: Physical Therapy

## 2016-07-24 ENCOUNTER — Telehealth: Payer: Self-pay | Admitting: Family Medicine

## 2016-07-24 DIAGNOSIS — M6281 Muscle weakness (generalized): Secondary | ICD-10-CM

## 2016-07-24 DIAGNOSIS — M25611 Stiffness of right shoulder, not elsewhere classified: Secondary | ICD-10-CM

## 2016-07-24 DIAGNOSIS — M25511 Pain in right shoulder: Secondary | ICD-10-CM

## 2016-07-24 NOTE — Telephone Encounter (Signed)
Patient came in today wanting to know if she can bring in blood pressure in on Thursday at 1145 and she also has a few question she would like to discuss with you. She stating that you can reach her through myChart as well.  Contact Info: [phone]

## 2016-07-24 NOTE — Therapy (Signed)
[facility] [address] [place], [place], [address] Phone: [phone]   Fax:  [phone]  Physical Therapy Treatment  Patient Details  Name: [name] MRN: [id number] Date of Birth: 07-19-1941 Referring Provider: Kathryne Hitch, MD  Encounter Date: 07/24/2016      PT End of Session - 07/24/16 0907    Visit Number 9   Number of Visits 19   Date for PT Re-Evaluation 08/22/16   PT Start Time 0846   PT Stop Time 0929   PT Time Calculation (min) 43 min   Activity Tolerance Patient tolerated treatment well      Past Medical History:  Diagnosis Date  . Abnormal thyroid function test 07/01/2013  . Arthritis   . Cancer ([place])   . Dermatitis 11/25/2013  . Dyslipidemia   . Glaucoma 11/02/2012  . HTN (hypertension) 11/02/2012  . 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 Hyperlipidemia   . Obesity     Past Surgical History:  Procedure Laterality Date  . BREAST SURGERY  10/2005   Left Mastectomy, reconstruction, reduction on right. Dr. Harlow Mares  . CRYOTHERAPY     GYN for CIN III  . DILATION AND CURETTAGE OF UTERUS    . EXCISION MORTON'S NEUROMA     right foot    There were no vitals filed for this visit.      Subjective Assessment - 07/24/16 0851    Subjective "The doctor said it was healing slow."  Denies pain today but states she might have soreness for 1-2 days after therapy.  I wear my sling but I still use my arm for housework at home.  I don't have to take pain medicine anymore.    Currently in Pain? No/denies   Pain Score 0-No pain   Pain Orientation Right   Pain Type Acute pain            [facility] PT Assessment - 07/24/16 0001      Observation/Other Assessments   Focus on Therapeutic Outcomes (FOTO)  58% limitation     AROM   Overall AROM Comments supine flexion/elevation 125 degrees actively   Right Shoulder Flexion 100 Degrees  standing                      [facility] Adult PT Treatment/Exercise - 07/24/16 0001      Shoulder Exercises: Supine   Protraction AROM;Right;10 reps   Other Supine Exercises bent arm raises for elevation 10x   Other Supine Exercises 12:00/6 10x;  9:00/4 10x at 90 degrees elevation     Shoulder Exercises: Seated   Other Seated Exercises AA flexion on stair railing      Shoulder Exercises: Pulleys   Flexion 3 minutes   ABduction 3 minutes     Shoulder Exercises: ROM/Strengthening   Other ROM/Strengthening Exercises Standing UE Ranger on floor, 1st  2nd and 3rdstep 10x each; UERanger on 2nd step with rainbow motion 15x                  PT Short Term Goals - 07/24/16 0924      PT SHORT TERM GOAL #1   Title be independent in initial HEP   Status Achieved     PT SHORT TERM GOAL #2   Title demonstrate Rt shoulder A/ROM flexion to 100 degrees to improve overhead reaching   Status Achieved     PT SHORT TERM GOAL #  3   Title demonstrate 50 degrees P/ROM Rt shoulder ER to improve use of Rt UE   Status Achieved     PT SHORT TERM GOAL #4   Title report < or = to 3/10 Rt shoulder pain with movement   Status Achieved           PT Long Term Goals - August 07, 2016 1742      PT LONG TERM GOAL #1   Title be independent in advanced HEP   Time 8   Period Weeks   Status On-going     PT LONG TERM GOAL #2   Title reduce FOTO to < or = to 36% limitation   Time 8   Period Weeks   Status On-going     PT LONG TERM GOAL #3   Title demonstrate Rt shoulder A/ROM flexion to 115 degrees to improve overhead use   Time 8   Period Weeks   Status On-going     PT LONG TERM GOAL #4   Title wean from sling and report 50% use of Rt UE with ADLs and self-care   Time 8   Period Weeks   Status On-going     PT LONG TERM GOAL #5   Title demonstrate 4-/5 Rt shoulder strength thoughout to improve use with home tasks and safe driving   Time 8   Period Weeks   Status On-going     PT LONG  TERM GOAL #6   Title report  <or = to 2/10 Rt shoulder pain with use with home tasks and self-care   Time 8   Period Weeks   Status On-going               Plan - 2016/08/07 0908    Clinical Impression Statement The patient continues to have slow bone healing which has slowed her progression with rehab.  She reports moderate improvement since starting PT although her FOTO outcome score dropped slightly.  She reports decreased medicine usage and declines the need for modalities today.  Improved supine AROM (gravity assisted) as well as standing AROM (against gravity).  Verbal and tactile cues to decrease compensatory shoulder hike.  Therapist also closely monitoring pain.  Progressing with STGs.     PT Next Visit Plan Gcode and FOTO done today (early) on visit #9;  continue with slower progression secondary to delayed bone healing  AAROM and AROM shoulder       Patient will benefit from skilled therapeutic intervention in order to improve the following deficits and impairments:     Visit Diagnosis: Stiffness of right shoulder, not elsewhere classified  Muscle weakness (generalized)  Acute pain of right shoulder       G-Codes - 2016/08/07 1743    Functional Assessment Tool Used FOTO   Functional Limitation Other PT primary   Other PT Primary Current Status [id number]) At least 40 percent but less than 60 percent impaired, limited or restricted   Other PT Primary Goal Status [id number]) At least 20 percent but less than 40 percent impaired, limited or restricted      Problem List Patient Active Problem List   Diagnosis Date Noted  . Medicare annual wellness visit, subsequent 09/26/2014  . Hypothyroidism 07/01/2013  . Glaucoma 11/02/2012  . White coat hypertension 11/02/2012  . Vitamin D deficiency 11/02/2012  . Hyperlipidemia, mixed 07/24/2011  . Obesity (BMI 30-39.9) 07/24/2011  . hx: breast cancer, IDC (Microinvasive) w DCIS, 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 - 07/10/2011   Ruben Im,  PT 07/24/16 5:44 PM Phone: [phone] Fax: [phone]  [name] 07/24/2016, 5:44 PM  [facility] [address], [place], [place], [address] Phone: [phone]   Fax:  [phone]  Name: [name] MRN: [id number] Date of Birth: Jan 08, 1941

## 2016-07-25 NOTE — Telephone Encounter (Signed)
Pt was told she can bring it in at 11:45 on Thursday

## 2016-07-27 ENCOUNTER — Encounter: Payer: Self-pay | Admitting: Physical Therapy

## 2016-07-27 ENCOUNTER — Ambulatory Visit: Payer: Medicare Other | Admitting: Physical Therapy

## 2016-07-27 DIAGNOSIS — M25611 Stiffness of right shoulder, not elsewhere classified: Secondary | ICD-10-CM | POA: Diagnosis not present

## 2016-07-27 DIAGNOSIS — M25511 Pain in right shoulder: Secondary | ICD-10-CM

## 2016-07-27 DIAGNOSIS — M6281 Muscle weakness (generalized): Secondary | ICD-10-CM

## 2016-07-27 NOTE — Therapy (Signed)
[facility] [address] [place], [place], [address] Phone: [phone]   Fax:  [phone]  Physical Therapy Treatment  Patient Details  Name: [name] MRN: [id number] Date of Birth: 1940/11/10 Referring Provider: Kathryne Hitch, MD  Encounter Date: 07/27/2016      PT End of Session - 07/27/16 0803    Visit Number 10   Number of Visits 19   Date for PT Re-Evaluation 08/22/16   PT Start Time 0800   PT Stop Time 0838   PT Time Calculation (min) 38 min   Activity Tolerance Patient tolerated treatment well   Behavior During Therapy [facility] for tasks assessed/performed      Past Medical History:  Diagnosis Date  . Abnormal thyroid function test 07/01/2013  . Arthritis   . Cancer ([place])   . Dermatitis 11/25/2013  . Dyslipidemia   . Glaucoma 11/02/2012  . HTN (hypertension) 11/02/2012  . 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 Hyperlipidemia   . Obesity     Past Surgical History:  Procedure Laterality Date  . BREAST SURGERY  10/2005   Left Mastectomy, reconstruction, reduction on right. Dr. Harlow Mares  . CRYOTHERAPY     GYN for CIN III  . DILATION AND CURETTAGE OF UTERUS    . EXCISION MORTON'S NEUROMA     right foot    There were no vitals filed for this visit.      Subjective Assessment - 07/27/16 0801    Subjective Pt reports only soreness today   Pertinent History breast cancer: no [place], no UBE, no heat to Lt shoulder.  Rt humerus fracture: 05/16/16.   Diagnostic tests x-ray: proximal humerus fracture   Patient Stated Goals improve use of Rt UE, imporve A/ROM and strength,   Currently in Pain? Yes   Pain Score 2    Pain Location Shoulder   Pain Orientation Right   Pain Descriptors / Indicators Sore   Pain Type Acute pain   Pain Onset More than a month ago   Pain Frequency Intermittent   Aggravating Factors  movement   Pain Relieving Factors rest   Multiple Pain Sites No                         [facility] Adult PT Treatment/Exercise - 07/27/16 0001      Shoulder Exercises: Supine   Protraction AROM;Right;10 reps   Horizontal ABduction Weight (lbs) AA/ROM with cane 20x    External Rotation AAROM;Right;20 reps   Flexion AAROM  AAROM with cane; flexed elbows 3x10   Shoulder Flexion Weight (lbs) 20x overhead with elbows extended   Other Supine Exercises bent arm raises for elevation 10x   Other Supine Exercises 12:00/6 10x;  9:00/4 10x at 90 degrees elevation     Shoulder Exercises: Seated   Other Seated Exercises AA flexion on stair railing      Shoulder Exercises: Pulleys   Flexion 3 minutes   ABduction 3 minutes     Shoulder Exercises: ROM/Strengthening   Other ROM/Strengthening Exercises Standing UE Ranger on floor, 1st  2nd and 3rdstep 10x each; UERanger on 2nd step with rainbow motion 15x     Manual Therapy   Manual therapy comments Rt shoulder pt in supine   Passive ROM In all planes                  PT Short Term Goals -  07/24/16 0924      PT SHORT TERM GOAL #1   Title be independent in initial HEP   Status Achieved     PT SHORT TERM GOAL #2   Title demonstrate Rt shoulder A/ROM flexion to 100 degrees to improve overhead reaching   Status Achieved     PT SHORT TERM GOAL #3   Title demonstrate 50 degrees P/ROM Rt shoulder ER to improve use of Rt UE   Status Achieved     PT SHORT TERM GOAL #4   Title report < or = to 3/10 Rt shoulder pain with movement   Status Achieved           PT Long Term Goals - 07/24/16 1742      PT LONG TERM GOAL #1   Title be independent in advanced HEP   Time 8   Period Weeks   Status On-going     PT LONG TERM GOAL #2   Title reduce FOTO to < or = to 36% limitation   Time 8   Period Weeks   Status On-going     PT LONG TERM GOAL #3   Title demonstrate Rt shoulder A/ROM flexion to 115 degrees to improve overhead use   Time 8   Period Weeks    Status On-going     PT LONG TERM GOAL #4   Title wean from sling and report 50% use of Rt UE with ADLs and self-care   Time 8   Period Weeks   Status On-going     PT LONG TERM GOAL #5   Title demonstrate 4-/5 Rt shoulder strength thoughout to improve use with home tasks and safe driving   Time 8   Period Weeks   Status On-going     PT LONG TERM GOAL #6   Title report  <or = to 2/10 Rt shoulder pain with use with home tasks and self-care   Time 8   Period Weeks   Status On-going               Plan - 07/27/16 0807    Clinical Impression Statement Pt able to tolerate all AA/ROM and A/ROM well needing tactile cues for proper form. Pt continues to be limited in ROM due to tightness and pain. Progressing slowly due to slow bone growth. Pt will continue to benefit from skilled therapy for UE ROM and strength.    Rehab Potential Good   PT Frequency 2x / week   PT Duration 8 weeks   PT Treatment/Interventions ADLs/Self Care Home Management;Cryotherapy;Electrical Stimulation;Functional mobility training;Moist Heat;Therapeutic activities;Therapeutic exercise;Neuromuscular re-education;Patient/family education;Passive range of motion;Manual techniques;Dry needling;Taping;Vasopneumatic Device   PT Next Visit Plan Continue AAROM and AROM   Consulted and Agree with Plan of Care Patient      Patient will benefit from skilled therapeutic intervention in order to improve the following deficits and impairments:  Postural dysfunction, Decreased strength, Impaired flexibility, Pain, Decreased activity tolerance, Decreased endurance, Impaired UE functional use, Decreased range of motion  Visit Diagnosis: Stiffness of right shoulder, not elsewhere classified  Muscle weakness (generalized)  Acute pain of right shoulder     Problem List Patient Active Problem List   Diagnosis Date Noted  . Medicare annual wellness visit, subsequent 09/26/2014  . Hypothyroidism 07/01/2013  . Glaucoma  11/02/2012  . White coat hypertension 11/02/2012  . Vitamin D deficiency 11/02/2012  . Hyperlipidemia, mixed 07/24/2011  . Obesity (BMI 30-39.9) 07/24/2011  . hx: breast cancer, IDC (Microinvasive) w DCIS,  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 - 07/10/2011    Mikle Bosworth PTA 07/27/2016, 8:50 AM  [facility] [address] [place], [place], [address] Phone: [phone]   Fax:  [phone]  Name: [name] MRN: [id number] Date of Birth: 12/07/1940

## 2016-07-30 ENCOUNTER — Encounter: Payer: Self-pay | Admitting: Physical Therapy

## 2016-07-30 ENCOUNTER — Ambulatory Visit: Payer: Medicare Other | Admitting: Physical Therapy

## 2016-07-30 DIAGNOSIS — M25611 Stiffness of right shoulder, not elsewhere classified: Secondary | ICD-10-CM | POA: Diagnosis not present

## 2016-07-30 DIAGNOSIS — M25511 Pain in right shoulder: Secondary | ICD-10-CM

## 2016-07-30 DIAGNOSIS — M6281 Muscle weakness (generalized): Secondary | ICD-10-CM

## 2016-07-30 NOTE — Therapy (Signed)
[facility] [address] [place], [place], [address] Phone: [phone]   Fax:  [phone]  Physical Therapy Treatment  Patient Details  Name: [name] MRN: [id number] Date of Birth: 01-10-1941 Referring Provider: Kathryne Hitch, MD  Encounter Date: 07/30/2016      PT End of Session - 07/30/16 0804    Visit Number 11   Number of Visits 19   Date for PT Re-Evaluation 08/22/16   PT Start Time 0759   PT Stop Time 0839   PT Time Calculation (min) 40 min   Activity Tolerance Patient tolerated treatment well   Behavior During Therapy [facility] for tasks assessed/performed      Past Medical History:  Diagnosis Date  . Abnormal thyroid function test 07/01/2013  . Arthritis   . Cancer ([place])   . Dermatitis 11/25/2013  . Dyslipidemia   . Glaucoma 11/02/2012  . HTN (hypertension) 11/02/2012  . 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 Hyperlipidemia   . Obesity     Past Surgical History:  Procedure Laterality Date  . BREAST SURGERY  10/2005   Left Mastectomy, reconstruction, reduction on right. Dr. Harlow Mares  . CRYOTHERAPY     GYN for CIN III  . DILATION AND CURETTAGE OF UTERUS    . EXCISION MORTON'S NEUROMA     right foot    There were no vitals filed for this visit.      Subjective Assessment - 07/30/16 0801    Subjective Shoulder feeling ok today.   Pertinent History breast cancer: no [place], no UBE, no heat to Lt shoulder.  Rt humerus fracture: 05/16/16.   Diagnostic tests x-ray: proximal humerus fracture   Patient Stated Goals improve use of Rt UE, imporve A/ROM and strength,   Currently in Pain? No/denies                         [facility] Adult PT Treatment/Exercise - 07/30/16 0001      Shoulder Exercises: Supine   Protraction AROM;Right;10 reps   Horizontal ABduction Weight (lbs) AA/ROM with cane 20x    External Rotation AAROM;Right;20 reps   Flexion AAROM  AAROM with cane; flexed elbows 3x10   Shoulder Flexion Weight (lbs) 20x overhead with elbows extended   Other Supine Exercises bent arm raises for elevation 10x   Other Supine Exercises 12:00/6 10x;  9:00/4 10x at 90 degrees elevation     Shoulder Exercises: Seated   Other Seated Exercises AA flexion on stair railing      Shoulder Exercises: Standing   Flexion AAROM;10 reps  finger ladder     Shoulder Exercises: Pulleys   Flexion 3 minutes   ABduction 3 minutes     Shoulder Exercises: ROM/Strengthening   Other ROM/Strengthening Exercises Standing UE Ranger 3rdstep 10x each; UERanger on 2nd step with rainbow motion 15x     Manual Therapy   Manual therapy comments Rt shoulder pt in supine   Passive ROM In all planes                  PT Short Term Goals - 07/30/16 0804      PT SHORT TERM GOAL #4   Title report < or = to 3/10 Rt shoulder pain with movement   Time 4   Period Weeks   Status Achieved           PT Long Term Goals -  07/30/16 0804      PT LONG TERM GOAL #1   Title be independent in advanced HEP   Time 8   Period Weeks   Status On-going     PT LONG TERM GOAL #2   Title reduce FOTO to < or = to 36% limitation   Time 8   Period Weeks   Status On-going     PT LONG TERM GOAL #3   Title demonstrate Rt shoulder A/ROM flexion to 115 degrees to improve overhead use   Time 8   Period Weeks   Status On-going     PT LONG TERM GOAL #4   Title wean from sling and report 50% use of Rt UE with ADLs and self-care   Time 8   Period Weeks   Status On-going     PT LONG TERM GOAL #5   Title demonstrate 4-/5 Rt shoulder strength thoughout to improve use with home tasks and safe driving   Time 8   Period Weeks   Status On-going     PT LONG TERM GOAL #6   Title report  <or = to 2/10 Rt shoulder pain with use with home tasks and self-care   Time 8   Period Weeks   Status On-going               Plan - 07/30/16 0810     Clinical Impression Statement Pt able to tolerate all ROM well. Progressing slowly per MD due to slow bone growth in shoulder. Pt will continue to benefit from skilled therapy for AA/ROM and PROM progression.    Rehab Potential Good   PT Frequency 2x / week   PT Duration 8 weeks   PT Treatment/Interventions ADLs/Self Care Home Management;Cryotherapy;Electrical Stimulation;Functional mobility training;Moist Heat;Therapeutic activities;Therapeutic exercise;Neuromuscular re-education;Patient/family education;Passive range of motion;Manual techniques;Dry needling;Taping;Vasopneumatic Device   PT Next Visit Plan Continue AAROM and AROM   Consulted and Agree with Plan of Care Patient      Patient will benefit from skilled therapeutic intervention in order to improve the following deficits and impairments:  Postural dysfunction, Decreased strength, Impaired flexibility, Pain, Decreased activity tolerance, Decreased endurance, Impaired UE functional use, Decreased range of motion  Visit Diagnosis: Stiffness of right shoulder, not elsewhere classified  Muscle weakness (generalized)  Acute pain of right shoulder     Problem List Patient Active Problem List   Diagnosis Date Noted  . Medicare annual wellness visit, subsequent 09/26/2014  . Hypothyroidism 07/01/2013  . Glaucoma 11/02/2012  . White coat hypertension 11/02/2012  . Vitamin D deficiency 11/02/2012  . Hyperlipidemia, mixed 07/24/2011  . Obesity (BMI 30-39.9) 07/24/2011  . hx: breast cancer, IDC (Microinvasive) w DCIS, 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 - 07/10/2011    Mikle Bosworth PTA 07/30/2016, 8:41 AM  [facility] [address] [place], [place], [address] Phone: [phone]   Fax:  [phone]  Name: [name] MRN: [id number] Date of Birth: May 12, 1941

## 2016-08-01 ENCOUNTER — Encounter: Payer: Self-pay | Admitting: Physical Therapy

## 2016-08-01 ENCOUNTER — Ambulatory Visit: Payer: Medicare Other | Admitting: Physical Therapy

## 2016-08-01 DIAGNOSIS — M6281 Muscle weakness (generalized): Secondary | ICD-10-CM

## 2016-08-01 DIAGNOSIS — M25611 Stiffness of right shoulder, not elsewhere classified: Secondary | ICD-10-CM

## 2016-08-01 DIAGNOSIS — M25511 Pain in right shoulder: Secondary | ICD-10-CM

## 2016-08-01 NOTE — Therapy (Signed)
[facility] [address] [place], [place], [address] Phone: [phone]   Fax:  [phone]  Physical Therapy Treatment  Patient Details  Name: [name] MRN: [id number] Date of Birth: 19-Aug-1941 Referring Provider: Kathryne Hitch, MD  Encounter Date: 08/01/2016      PT End of Session - 08/01/16 0804    Visit Number 12   Number of Visits 19   Date for PT Re-Evaluation 08/22/16   PT Start Time 0800   PT Stop Time 0844   PT Time Calculation (min) 44 min   Activity Tolerance Patient tolerated treatment well   Behavior During Therapy [facility] for tasks assessed/performed      Past Medical History:  Diagnosis Date  . Abnormal thyroid function test 07/01/2013  . Arthritis   . Cancer ([place])   . Dermatitis 11/25/2013  . Dyslipidemia   . Glaucoma 11/02/2012  . HTN (hypertension) 11/02/2012  . 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 Hyperlipidemia   . Obesity     Past Surgical History:  Procedure Laterality Date  . BREAST SURGERY  10/2005   Left Mastectomy, reconstruction, reduction on right. Dr. Harlow Mares  . CRYOTHERAPY     GYN for CIN III  . DILATION AND CURETTAGE OF UTERUS    . EXCISION MORTON'S NEUROMA     right foot    There were no vitals filed for this visit.      Subjective Assessment - 08/01/16 0803    Subjective Shoulder feeling good today.    Pertinent History breast cancer: no [place], no UBE, no heat to Lt shoulder.  Rt humerus fracture: 05/16/16.   Diagnostic tests x-ray: proximal humerus fracture   Patient Stated Goals improve use of Rt UE, imporve A/ROM and strength,   Currently in Pain? No/denies                         [facility] Adult PT Treatment/Exercise - 08/01/16 0001      Shoulder Exercises: Seated   Retraction Both;10 reps  Scap squeeze   Other Seated Exercises AA flexion and abduction on stair railing    Other Seated Exercises AA  cane flexion, abduction, external rotation  seated     Shoulder Exercises: Standing   Flexion AAROM;10 reps  finger ladder   ABduction AAROM;Right  UE ranger at step     Shoulder Exercises: Pulleys   Flexion 3 minutes   ABduction 3 minutes     Shoulder Exercises: ROM/Strengthening   Other ROM/Strengthening Exercises Standing UE Ranger 3rdstep 10x each; UERanger on 2nd step with rainbow motion 15x     Manual Therapy   Passive ROM --                  PT Short Term Goals - 07/30/16 0804      PT SHORT TERM GOAL #4   Title report < or = to 3/10 Rt shoulder pain with movement   Time 4   Period Weeks   Status Achieved           PT Long Term Goals - 07/30/16 [id number]      PT LONG TERM GOAL #1   Title be independent in advanced HEP   Time 8   Period Weeks   Status On-going     PT LONG TERM GOAL #2   Title reduce FOTO to < or = to 36% limitation  Time 8   Period Weeks   Status On-going     PT LONG TERM GOAL #3   Title demonstrate Rt shoulder A/ROM flexion to 115 degrees to improve overhead use   Time 8   Period Weeks   Status On-going     PT LONG TERM GOAL #4   Title wean from sling and report 50% use of Rt UE with ADLs and self-care   Time 8   Period Weeks   Status On-going     PT LONG TERM GOAL #5   Title demonstrate 4-/5 Rt shoulder strength thoughout to improve use with home tasks and safe driving   Time 8   Period Weeks   Status On-going     PT LONG TERM GOAL #6   Title report  <or = to 2/10 Rt shoulder pain with use with home tasks and self-care   Time 8   Period Weeks   Status On-going               Plan - 08/01/16 0824    Clinical Impression Statement Pt request no supine execises today due to having her hair done for the Holiday. Pt able to tolerate all standing and seated exercises well. Progressing well with ROM. Continue with AA/ROM due to delayed bone growth.    PT Frequency 2x / week   PT Duration 8 weeks   PT  Treatment/Interventions ADLs/Self Care Home Management;Cryotherapy;Electrical Stimulation;Functional mobility training;Moist Heat;Therapeutic activities;Therapeutic exercise;Neuromuscular re-education;Patient/family education;Passive range of motion;Manual techniques;Dry needling;Taping;Vasopneumatic Device   PT Next Visit Plan Continue AAROM and AROM   Consulted and Agree with Plan of Care Patient      Patient will benefit from skilled therapeutic intervention in order to improve the following deficits and impairments:  Postural dysfunction, Decreased strength, Impaired flexibility, Pain, Decreased activity tolerance, Decreased endurance, Impaired UE functional use, Decreased range of motion  Visit Diagnosis: Stiffness of right shoulder, not elsewhere classified  Muscle weakness (generalized)  Acute pain of right shoulder     Problem List Patient Active Problem List   Diagnosis Date Noted  . Medicare annual wellness visit, subsequent 09/26/2014  . Hypothyroidism 07/01/2013  . Glaucoma 11/02/2012  . White coat hypertension 11/02/2012  . Vitamin D deficiency 11/02/2012  . Hyperlipidemia, mixed 07/24/2011  . Obesity (BMI 30-39.9) 07/24/2011  . hx: breast cancer, IDC (Microinvasive) w DCIS, 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 - 07/10/2011    Mikle Bosworth PTA 08/01/2016, 8:52 AM  [facility] [address] [place], [place], [address] Phone: [phone]   Fax:  [phone]  Name: [name] MRN: [id number] Date of Birth: April 27, 1941

## 2016-08-06 ENCOUNTER — Ambulatory Visit: Payer: Medicare Other | Admitting: Physical Therapy

## 2016-08-06 ENCOUNTER — Encounter: Payer: Self-pay | Admitting: Physical Therapy

## 2016-08-06 DIAGNOSIS — M25611 Stiffness of right shoulder, not elsewhere classified: Secondary | ICD-10-CM | POA: Diagnosis not present

## 2016-08-06 DIAGNOSIS — M6281 Muscle weakness (generalized): Secondary | ICD-10-CM

## 2016-08-06 NOTE — Therapy (Signed)
[facility] [address] [place], [place], [address] Phone: [phone]   Fax:  [phone]  Physical Therapy Treatment  Patient Details  Name: [name] MRN: [id number] Date of Birth: [birth year] Referring Provider: Kathryne Hitch, MD  Encounter Date: 08/06/2016      PT End of Session - 08/06/16 0808    Visit Number 13   Number of Visits 19   Date for PT Re-Evaluation 08/22/16   PT Start Time 0806   PT Stop Time 0845   PT Time Calculation (min) 39 min   Activity Tolerance Patient tolerated treatment well   Behavior During Therapy [facility] for tasks assessed/performed      Past Medical History:  Diagnosis Date  . Abnormal thyroid function test 07/01/2013  . Arthritis   . Cancer ([place])   . Dermatitis 11/25/2013  . Dyslipidemia   . Glaucoma 11/02/2012  . HTN (hypertension) 11/02/2012  . 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 Hyperlipidemia   . Obesity     Past Surgical History:  Procedure Laterality Date  . BREAST SURGERY  10/2005   Left Mastectomy, reconstruction, reduction on right. Dr. Harlow Mares  . CRYOTHERAPY     GYN for CIN III  . DILATION AND CURETTAGE OF UTERUS    . EXCISION MORTON'S NEUROMA     right foot    There were no vitals filed for this visit.      Subjective Assessment - 08/06/16 0807    Subjective No complaints, intermittent soreness.    Currently in Pain? No/denies   Multiple Pain Sites No                         [facility] Adult PT Treatment/Exercise - 08/06/16 0001      Shoulder Exercises: Supine   Horizontal ABduction Weight (lbs) AA/ROM with cane 20x    External Rotation AAROM;Right;20 reps   Flexion AAROM  AAROM with cane; flexed elbows 3x10   Shoulder Flexion Weight (lbs) 20x overhead with elbows extended     Shoulder Exercises: Seated   Flexion AAROM;Right;20 reps  Elbow flexed   Other Seated Exercises Shoulder  circles 10x each directions     Shoulder Exercises: Standing   Flexion AAROM;10 reps  finger ladder, TC for scap glide   ABduction AAROM;Right  UE ranger at step   Other Standing Exercises UE ranger circles, flexion, abduction  10 xeach direction     Shoulder Exercises: Pulleys   Flexion 3 minutes   ABduction 3 minutes     Manual Therapy   Soft tissue mobilization Spine of scap with gentle mobs to scapula  Sidelying lattisimus release, with AAROM for abd                  PT Short Term Goals - 07/30/16 0804      PT SHORT TERM GOAL #4   Title report < or = to 3/10 Rt shoulder pain with movement   Time 4   Period Weeks   Status Achieved           PT Long Term Goals - 08/06/16 0809      PT LONG TERM GOAL #1   Title be independent in advanced HEP   Time 8   Period Weeks   Status On-going     PT LONG TERM GOAL #3   Title demonstrate Rt shoulder A/ROM flexion to 115 degrees  to improve overhead use   Period Weeks   Status On-going     PT LONG TERM GOAL #4   Title wean from sling and report 50% use of Rt UE with ADLs and self-care   Time 8   Period Weeks   Status On-going  Still using per MD     PT LONG TERM GOAL #6   Title report  <or = to 2/10 Rt shoulder pain with use with home tasks and self-care   Time 8   Period Weeks   Status Partially Met               Plan - 08/06/16 2241    Clinical Impression Statement Pt will see MD on the 5th of next week. Currently staying withon MD instructions to perform gentle ROM exercises. Pt monitored throughout pt for pain and compensation. No pain during tx.    Rehab Potential Good   PT Frequency 2x / week   PT Duration 8 weeks   PT Treatment/Interventions ADLs/Self Care Home Management;Cryotherapy;Electrical Stimulation;Functional mobility training;Moist Heat;Therapeutic activities;Therapeutic exercise;Neuromuscular re-education;Patient/family education;Passive range of motion;Manual techniques;Dry  needling;Taping;Vasopneumatic Device   PT Next Visit Plan Continue AAROM and AROM   Consulted and Agree with Plan of Care Patient      Patient will benefit from skilled therapeutic intervention in order to improve the following deficits and impairments:  Postural dysfunction, Decreased strength, Impaired flexibility, Pain, Decreased activity tolerance, Decreased endurance, Impaired UE functional use, Decreased range of motion  Visit Diagnosis: Stiffness of right shoulder, not elsewhere classified  Muscle weakness (generalized)     Problem List Patient Active Problem List   Diagnosis Date Noted  . Medicare annual wellness visit, subsequent 09/26/2014  . Hypothyroidism 07/01/2013  . Glaucoma 11/02/2012  . White coat hypertension 11/02/2012  . Vitamin D deficiency 11/02/2012  . Hyperlipidemia, mixed 07/24/2011  . Obesity (BMI 30-39.9) 07/24/2011  . hx: breast cancer, IDC (Microinvasive) w DCIS, 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 - 07/10/2011    [name], PTA 08/06/2016, 8:41 AM  [facility] [address] [place], [place], [address] Phone: [phone]   Fax:  [phone]  Name: [name] MRN: [id number] Date of Birth: [birth year]

## 2016-08-09 ENCOUNTER — Ambulatory Visit: Payer: Medicare Other

## 2016-08-09 DIAGNOSIS — M25511 Pain in right shoulder: Secondary | ICD-10-CM

## 2016-08-09 DIAGNOSIS — M25611 Stiffness of right shoulder, not elsewhere classified: Secondary | ICD-10-CM

## 2016-08-09 DIAGNOSIS — M6281 Muscle weakness (generalized): Secondary | ICD-10-CM

## 2016-08-09 NOTE — Therapy (Signed)
[facility] [address] [place], [place], [address] Phone: [phone]   Fax:  [phone]  Physical Therapy Treatment  Patient Details  Name: [name] MRN: [id number] Date of Birth: 03-Dec-1940 Referring Provider: Kathryne Hitch, MD  Encounter Date: 08/09/2016      PT End of Session - 08/09/16 0842    Visit Number 14   Number of Visits 19   Date for PT Re-Evaluation 08/22/16   PT Start Time 0800   PT Stop Time 0840   PT Time Calculation (min) 40 min   Activity Tolerance Patient tolerated treatment well      Past Medical History:  Diagnosis Date  . Abnormal thyroid function test 07/01/2013  . Arthritis   . Cancer ([place])   . Dermatitis 11/25/2013  . Dyslipidemia   . Glaucoma 11/02/2012  . HTN (hypertension) 11/02/2012  . 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 Hyperlipidemia   . Obesity     Past Surgical History:  Procedure Laterality Date  . BREAST SURGERY  10/2005   Left Mastectomy, reconstruction, reduction on right. Dr. Harlow Mares  . CRYOTHERAPY     GYN for CIN III  . DILATION AND CURETTAGE OF UTERUS    . EXCISION MORTON'S NEUROMA     right foot    There were no vitals filed for this visit.      Subjective Assessment - 08/09/16 0802    Subjective Pt reports that Rt shoulder is coming along.  Using the Rt UE more frequently.     Pertinent History breast cancer: no [place], no UBE, no heat to Lt shoulder.  Rt humerus fracture: 05/16/16.   Diagnostic tests x-ray: proximal humerus fracture   Patient Stated Goals improve use of Rt UE, imporve A/ROM and strength,   Currently in Pain? Yes   Pain Score 3    Pain Location Shoulder   Pain Orientation Right   Pain Descriptors / Indicators Sore   Pain Type Acute pain   Pain Onset More than a month ago   Pain Frequency Intermittent   Aggravating Factors  movement, reaching overhead   Pain Relieving Factors rest                          [facility] Adult PT Treatment/Exercise - 08/09/16 0001      Shoulder Exercises: Supine   Flexion AAROM  AAROM with cane; flexed elbows 3x10   Shoulder Flexion Weight (lbs) 20x overhead with elbows extended     Shoulder Exercises: Seated   Flexion AAROM;Right;20 reps   Flexion Limitations scaption: AROM Rt x 20   Abduction AROM;Right;20 reps   Other Seated Exercises Shoulder circles 10x each directions     Shoulder Exercises: Standing   Flexion AAROM;20 reps  finger ladder, TC for scap glide   ABduction AAROM;Right  UE ranger at step   Other Standing Exercises UE ranger circles, flexion, abduction  10 xeach direction   Other Standing Exercises cone stack to 2nd shelf     Shoulder Exercises: Pulleys   Flexion 3 minutes   ABduction 3 minutes                  PT Short Term Goals - 07/30/16 0804      PT SHORT TERM GOAL #4   Title report < or = to 3/10 Rt shoulder pain with movement   Time 4  Period Weeks   Status Achieved           PT Long Term Goals - 08/09/16 0810      PT LONG TERM GOAL #4   Title wean from sling and report 50% use of Rt UE with ADLs and self-care   Status Achieved               Plan - 08/09/16 0806    Clinical Impression Statement Pt will see MD next week. Pt has been working on gentle A/ROM exercises in the clinic per MD instruction.  Pt reports 75% use of the Rt UE with home tasks.  Not driving or doing any lifting per MD instructions.  Pt with increased ease with putting on deodorant due to improve A/ROM.  Pt will continue to benefit from skilled PT for Rt shoulder ROM and strength progression as allowed by MD.     Rehab Potential Good   PT Frequency 2x / week   PT Duration 8 weeks   PT Treatment/Interventions ADLs/Self Care Home Management;Cryotherapy;Electrical Stimulation;Functional mobility training;Moist Heat;Therapeutic activities;Therapeutic exercise;Neuromuscular  re-education;Patient/family education;Passive range of motion;Manual techniques;Dry needling;Taping;Vasopneumatic Device   PT Next Visit Plan Continue AAROM and AROM,  take A/ROM and strength measures and route note to MD   Consulted and Agree with Plan of Care Patient      Patient will benefit from skilled therapeutic intervention in order to improve the following deficits and impairments:  Postural dysfunction, Decreased strength, Impaired flexibility, Pain, Decreased activity tolerance, Decreased endurance, Impaired UE functional use, Decreased range of motion  Visit Diagnosis: Stiffness of right shoulder, not elsewhere classified  Muscle weakness (generalized)  Acute pain of right shoulder     Problem List Patient Active Problem List   Diagnosis Date Noted  . Medicare annual wellness visit, subsequent 09/26/2014  . Hypothyroidism 07/01/2013  . Glaucoma 11/02/2012  . White coat hypertension 11/02/2012  . Vitamin D deficiency 11/02/2012  . Hyperlipidemia, mixed 07/24/2011  . Obesity (BMI 30-39.9) 07/24/2011  . hx: breast cancer, IDC (Microinvasive) w DCIS, 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 - 07/10/2011     Sigurd Sos, PT 08/09/16 8:44 AM  [facility] [address], [place], [place], [address] Phone: [phone]   Fax:  [phone]  Name: [name] MRN: [id number] Date of Birth: 02/17/1941

## 2016-08-13 ENCOUNTER — Encounter: Payer: Self-pay | Admitting: Physical Therapy

## 2016-08-13 ENCOUNTER — Ambulatory Visit: Payer: Medicare Other | Attending: Orthopedic Surgery | Admitting: Physical Therapy

## 2016-08-13 DIAGNOSIS — M25511 Pain in right shoulder: Secondary | ICD-10-CM | POA: Insufficient documentation

## 2016-08-13 DIAGNOSIS — M25611 Stiffness of right shoulder, not elsewhere classified: Secondary | ICD-10-CM | POA: Insufficient documentation

## 2016-08-13 DIAGNOSIS — M6281 Muscle weakness (generalized): Secondary | ICD-10-CM | POA: Insufficient documentation

## 2016-08-13 NOTE — Therapy (Signed)
[facility] [address], [place], [place], [address] Phone: [phone]   Fax:  [phone]  Physical Therapy Treatment  Patient Details  Name: [name] MRN: [id number] Date of Birth: 1941/07/30 Referring Provider: Kathryne Hitch, MD  Encounter Date: 08/13/2016      PT End of Session - 08/13/16 0802    Visit Number 15   Number of Visits 19   Date for PT Re-Evaluation 08/22/16   PT Start Time 0801   PT Stop Time 0845   PT Time Calculation (min) 44 min   Activity Tolerance Patient tolerated treatment well   Behavior During Therapy [facility] for tasks assessed/performed      Past Medical History:  Diagnosis Date  . Abnormal thyroid function test 07/01/2013  . Arthritis   . Cancer ([place])   . Dermatitis 11/25/2013  . Dyslipidemia   . Glaucoma 11/02/2012  . HTN (hypertension) 11/02/2012  . 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 Hyperlipidemia   . Obesity     Past Surgical History:  Procedure Laterality Date  . BREAST SURGERY  10/2005   Left Mastectomy, reconstruction, reduction on right. Dr. Harlow Mares  . CRYOTHERAPY     GYN for CIN III  . DILATION AND CURETTAGE OF UTERUS    . EXCISION MORTON'S NEUROMA     right foot    There were no vitals filed for this visit.      Subjective Assessment - 08/13/16 0803    Subjective Going to MD tomorrow. No complaints this AM.    Currently in Pain? No/denies   Pain Descriptors / Indicators --  Intermittent sorness   Aggravating Factors  Just an intermittent soreness   Multiple Pain Sites No            [facility] PT Assessment - 08/13/16 0001      AROM   Right Shoulder Flexion 105 Degrees  standing   Right Shoulder Internal Rotation --  Barely reaches lateral border of glutes     PROM   Right Shoulder Flexion 142 Degrees   Right Shoulder ABduction 95 Degrees   Right Shoulder External Rotation 60 Degrees                      [facility] Adult PT Treatment/Exercise - 08/13/16 0001      Shoulder Exercises: Supine   Horizontal ABduction Weight (lbs) AA/ROM with cane 20x    External Rotation AAROM;Right;20 reps   Flexion AAROM  AAROM with cane; flexed elbows 3x10   Shoulder Flexion Weight (lbs) 20x overhead with elbows extended     Shoulder Exercises: Standing   Other Standing Exercises UE ranger circles, flexion, abduction  20xeach direction   Other Standing Exercises cone stack to 2nd shelf     Shoulder Exercises: Pulleys   Flexion 3 minutes   ABduction 3 minutes     Shoulder Exercises: Stretch   Other Shoulder Stretches Behind the back stretch: with cane and towel                 PT Education - 08/13/16 0830    Education provided Yes   Education Details Behind the back stretching to the shoulder for HEP   Person(s) Educated Patient   Methods Explanation;Demonstration;Tactile cues;Verbal cues;Handout   Comprehension Verbalized understanding;Returned demonstration          PT Short Term Goals - 08/13/16 [id number]      PT  SHORT TERM GOAL #1   Title be independent in initial HEP   Time 4   Period Weeks   Status Achieved     PT SHORT TERM GOAL #2   Title demonstrate Rt shoulder A/ROM flexion to 100 degrees to improve overhead reaching   Time 4   Period Weeks   Status Achieved     PT SHORT TERM GOAL #3   Title demonstrate 50 degrees P/ROM Rt shoulder ER to improve use of Rt UE   Time 4   Period Weeks   Status Achieved     PT SHORT TERM GOAL #4   Title report < or = to 3/10 Rt shoulder pain with movement   Time 4   Period Weeks   Status Achieved           PT Long Term Goals - 08/13/16 0815      PT LONG TERM GOAL #1   Title be independent in advanced HEP   Time 8   Period Weeks   Status On-going     PT LONG TERM GOAL #4   Title wean from sling and report 50% use of Rt UE with ADLs and self-care   Time 8   Period Weeks   Status Achieved      PT LONG TERM GOAL #5   Title demonstrate 4-/5 Rt shoulder strength thoughout to improve use with home tasks and safe driving   Time 8   Period Weeks   Status On-going               Plan - 08/13/16 0802    Clinical Impression Statement A/ROM for flexion improving slowly, Passive also improving. Pt continues to wear sling per MD instructions. Pain is not significant or often.  Pt not allowed to drive yet.     Rehab Potential Good   PT Frequency 2x / week   PT Duration 8 weeks   PT Treatment/Interventions ADLs/Self Care Home Management;Cryotherapy;Electrical Stimulation;Functional mobility training;Moist Heat;Therapeutic activities;Therapeutic exercise;Neuromuscular re-education;Patient/family education;Passive range of motion;Manual techniques;Dry needling;Taping;Vasopneumatic Device   PT Next Visit Plan See what MD says.   Consulted and Agree with Plan of Care Patient      Patient will benefit from skilled therapeutic intervention in order to improve the following deficits and impairments:  Postural dysfunction, Decreased strength, Impaired flexibility, Pain, Decreased activity tolerance, Decreased endurance, Impaired UE functional use, Decreased range of motion  Visit Diagnosis: Stiffness of right shoulder, not elsewhere classified  Muscle weakness (generalized)  Acute pain of right shoulder     Problem List Patient Active Problem List   Diagnosis Date Noted  . Medicare annual wellness visit, subsequent 09/26/2014  . Hypothyroidism 07/01/2013  . Glaucoma 11/02/2012  . White coat hypertension 11/02/2012  . Vitamin D deficiency 11/02/2012  . Hyperlipidemia, mixed 07/24/2011  . Obesity (BMI 30-39.9) 07/24/2011  . hx: breast cancer, IDC (Microinvasive) w DCIS, 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 - 07/10/2011    Dorena Dorfman, PTA 08/13/2016, 8:43 AM  [facility] [address], [place], [place], [address] Phone:  [phone]   Fax:  [phone]  Name: [name] MRN: [id number] Date of Birth: [birth year]

## 2016-08-13 NOTE — Patient Instructions (Signed)
Flexion (Eccentric) - Active-Assist (Cane)   Use unaffected arm to push affected arm forward. Avoid hiking shoulder. Keep palm relaxed. Slowly lower affected arm for 3-5 seconds, increasing use of affected arm. ___ reps per set, ___ sets per day, ___ days per week. Add ___ lbs when you achieve ___ repetitions.  Copyright  VHI. All rights reserved  Cane Exercise: Abduction   Hold cane with right hand over end, palm-up, with other hand palm-down. Move arm out from side and up by pushing with other arm. Hold ____ seconds. Repeat ____ times. Do ____ sessions per day.  http://[email or web address].exer.us/81   Copyright  VHI. All rights reserved.      Cane Exercise: Extension / Internal Rotation   Stand holding cane behind back with both hands palm-up. Slide cane up spine toward head. Hold _5___ seconds. Repeat __10-20__ times. Do 3____ sessions per day.  http://[email or web address].exer.us/85   Copyright  VHI. All rights reserved.  Kasandra Knudsen Exercise: Extension   Stand holding cane behind back with both hands palm-up. Lift the cane away from body. Hold ____ seconds. Repeat ____ times. Do ____ sessions per day.  http://[email or web address].exer.us/83   Copyright  VHI. All rights reserved.

## 2016-08-16 ENCOUNTER — Ambulatory Visit: Payer: Medicare Other | Admitting: Physical Therapy

## 2016-08-16 ENCOUNTER — Encounter: Payer: Self-pay | Admitting: Physical Therapy

## 2016-08-16 DIAGNOSIS — M25611 Stiffness of right shoulder, not elsewhere classified: Secondary | ICD-10-CM

## 2016-08-16 DIAGNOSIS — M6281 Muscle weakness (generalized): Secondary | ICD-10-CM

## 2016-08-16 DIAGNOSIS — M25511 Pain in right shoulder: Secondary | ICD-10-CM

## 2016-08-16 NOTE — Therapy (Signed)
[facility] [address], [place], [place], [address] Phone: [phone]   Fax:  [phone]  Physical Therapy Treatment  Patient Details  Name: [name] MRN: [id number] Date of Birth: Oct 01, 1940 Referring Provider: Kathryne Hitch, MD  Encounter Date: 08/16/2016      PT End of Session - 08/16/16 0807    Visit Number 16   Number of Visits 19   Date for PT Re-Evaluation 08/22/16   PT Start Time 0801   PT Stop Time 0840   PT Time Calculation (min) 39 min   Activity Tolerance Patient tolerated treatment well   Behavior During Therapy [facility] for tasks assessed/performed      Past Medical History:  Diagnosis Date  . Abnormal thyroid function test 07/01/2013  . Arthritis   . Cancer ([place])   . Dermatitis 11/25/2013  . Dyslipidemia   . Glaucoma 11/02/2012  . HTN (hypertension) 11/02/2012  . 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 Hyperlipidemia   . Obesity     Past Surgical History:  Procedure Laterality Date  . BREAST SURGERY  10/2005   Left Mastectomy, reconstruction, reduction on right. Dr. Harlow Mares  . CRYOTHERAPY     GYN for CIN III  . DILATION AND CURETTAGE OF UTERUS    . EXCISION MORTON'S NEUROMA     right foot    There were no vitals filed for this visit.      Subjective Assessment - 08/16/16 0806    Subjective Pt reports MD has cleared her to drive. Xray looked very good and bone is healing well. Have not received new orders from MD [place] pt status for rehab and strengthening. Pt stated no pain just some soreness.   Pertinent History breast cancer: no [place], no UBE, no heat to Lt shoulder.  Rt humerus fracture: 05/16/16.   Diagnostic tests x-ray: proximal humerus fracture   Patient Stated Goals improve use of Rt UE, imporve A/ROM and strength,   Currently in Pain? No/denies   Multiple Pain Sites No                         [facility] Adult PT  Treatment/Exercise - 08/16/16 0001      Shoulder Exercises: Standing   Other Standing Exercises UE ranger circles, flexion, abduction  20xeach direction   Other Standing Exercises cone stack to 2nd shelf     Shoulder Exercises: Pulleys   Flexion 3 minutes   ABduction 3 minutes     Shoulder Exercises: ROM/Strengthening   Other ROM/Strengthening Exercises 6 way isomatrics at wall   Other ROM/Strengthening Exercises Manual isometrics  Pt supine, elbow bent     Shoulder Exercises: Stretch   Other Shoulder Stretches Behind the back stretch: with cane and towel      Manual Therapy   Manual therapy comments Manual isometrics   Soft tissue mobilization To Rt shoulder joint   Passive ROM To Rt shoulder in internal rotation                  PT Short Term Goals - 08/13/16 7026      PT SHORT TERM GOAL #1   Title be independent in initial HEP   Time 4   Period Weeks   Status Achieved     PT SHORT TERM GOAL #2   Title demonstrate Rt shoulder A/ROM flexion to 100 degrees to improve overhead reaching  Time 4   Period Weeks   Status Achieved     PT SHORT TERM GOAL #3   Title demonstrate 50 degrees P/ROM Rt shoulder ER to improve use of Rt UE   Time 4   Period Weeks   Status Achieved     PT SHORT TERM GOAL #4   Title report < or = to 3/10 Rt shoulder pain with movement   Time 4   Period Weeks   Status Achieved           PT Long Term Goals - 08/16/16 0818      PT LONG TERM GOAL #1   Title be independent in advanced HEP   Time 8   Period Weeks   Status On-going     PT LONG TERM GOAL #2   Title reduce FOTO to < or = to 36% limitation   Time 8   Period Weeks   Status On-going     PT LONG TERM GOAL #3   Title demonstrate Rt shoulder A/ROM flexion to 115 degrees to improve overhead use   Time 8   Period Weeks   Status On-going     PT LONG TERM GOAL #5   Title demonstrate 4-/5 Rt shoulder strength thoughout to improve use with home tasks and safe driving    Period Weeks   Status On-going     PT LONG TERM GOAL #6   Title report  <or = to 2/10 Rt shoulder pain with use with home tasks and self-care   Time 8   Period Weeks   Status Partially Met               Plan - 08/16/16 0840    Clinical Impression Statement Pt had good report from MD. Will continue to progress with light isometric strenghtening. Pt able to tolerate all exercises and stretches well. Pt will continue to benefit from skilled therapy for shoulder strength and stability.    Rehab Potential Good   PT Frequency 2x / week   PT Duration 8 weeks   PT Treatment/Interventions ADLs/Self Care Home Management;Cryotherapy;Electrical Stimulation;Functional mobility training;Moist Heat;Therapeutic activities;Therapeutic exercise;Neuromuscular re-education;Patient/family education;Passive range of motion;Manual techniques;Dry needling;Taping;Vasopneumatic Device   PT Next Visit Plan Isometrics strengthening and A/ROM   Consulted and Agree with Plan of Care Patient      Patient will benefit from skilled therapeutic intervention in order to improve the following deficits and impairments:  Postural dysfunction, Decreased strength, Impaired flexibility, Pain, Decreased activity tolerance, Decreased endurance, Impaired UE functional use, Decreased range of motion  Visit Diagnosis: Stiffness of right shoulder, not elsewhere classified  Muscle weakness (generalized)  Acute pain of right shoulder     Problem List Patient Active Problem List   Diagnosis Date Noted  . Medicare annual wellness visit, subsequent 09/26/2014  . Hypothyroidism 07/01/2013  . Glaucoma 11/02/2012  . White coat hypertension 11/02/2012  . Vitamin D deficiency 11/02/2012  . Hyperlipidemia, mixed 07/24/2011  . Obesity (BMI 30-39.9) 07/24/2011  . hx: breast cancer, IDC (Microinvasive) w DCIS, 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 - 07/10/2011    Mikle Bosworth PTA 08/16/2016, 8:45 AM  [facility] [address] [place], [place], [address] Phone: [phone]   Fax:  [phone]  Name: [name] MRN: [id number] Date of Birth: 1940-12-16

## 2016-08-21 ENCOUNTER — Ambulatory Visit: Payer: Medicare Other

## 2016-08-21 DIAGNOSIS — M25611 Stiffness of right shoulder, not elsewhere classified: Secondary | ICD-10-CM

## 2016-08-21 DIAGNOSIS — M6281 Muscle weakness (generalized): Secondary | ICD-10-CM

## 2016-08-21 DIAGNOSIS — M25511 Pain in right shoulder: Secondary | ICD-10-CM

## 2016-08-21 NOTE — Therapy (Addendum)
[facility] [address] [place], [place], [address] Phone: [phone]   Fax:  [phone]  Physical Therapy Treatment  Patient Details  Name: [name] MRN: [id number] Date of Birth: 21-Sep-1940 Referring Provider: Kathryne Hitch, MD  Encounter Date: 08/21/2016      PT End of Session - 08/21/16 0837    Visit Number 17   Number of Visits 19   Date for PT Re-Evaluation 2016/11/11   Authorization Type G-codes at 72, [place] on all visits in 2017   PT Start Time 0802   PT Stop Time 0842   PT Time Calculation (min) 40 min   Activity Tolerance Patient tolerated treatment well   Behavior During Therapy [facility] for tasks assessed/performed      Past Medical History:  Diagnosis Date  . Abnormal thyroid function test 07/01/2013  . Arthritis   . Cancer ([place])   . Dermatitis 11/25/2013  . Dyslipidemia   . Glaucoma 11/02/2012  . HTN (hypertension) 11/02/2012  . 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 Hyperlipidemia   . Obesity     Past Surgical History:  Procedure Laterality Date  . BREAST SURGERY  10/2005   Left Mastectomy, reconstruction, reduction on right. Dr. Harlow Mares  . CRYOTHERAPY     GYN for CIN III  . DILATION AND CURETTAGE OF UTERUS    . EXCISION MORTON'S NEUROMA     right foot    There were no vitals filed for this visit.      Subjective Assessment - 08/21/16 0807    Subjective Pt is able to drive now.  Pt is unsure if she is allowed to strengthen her shoulder.     Pertinent History breast cancer: no [place], no UBE, no heat to Lt shoulder.  Rt humerus fracture: 05/16/16.  MD OK with strength progression as tolerated (08/21/16)   Patient Stated Goals improve use of Rt UE, imporve A/ROM and strength,   Currently in Pain? No/denies            [facility] PT Assessment - 08/21/16 0001      Assessment   Medical Diagnosis s/p Rt proximal humerus fracture     Precautions    Precautions Shoulder;Other (comment)  history of breast cancer- no [place], heat to Rt shoulder or UBE     Cognition   Overall Cognitive Status Within Functional Limits for tasks assessed     Observation/Other Assessments   Focus on Therapeutic Outcomes (FOTO)  44% limitation     ROM / Strength   AROM / PROM / Strength AROM;PROM;Strength     AROM   AROM Assessment Site Shoulder   Right/Left Shoulder Right   Right Shoulder Flexion 115 Degrees   Right Shoulder ABduction 102 Degrees   Right Shoulder Internal Rotation --  Rt lateral buttock   Right Shoulder External Rotation --  to C7     Strength   Overall Strength Deficits   Strength Assessment Site Shoulder   Right/Left Shoulder Right   Right Shoulder Flexion 4/5   Right Shoulder ABduction 4-/5   Right Shoulder Internal Rotation 4/5   Right Shoulder External Rotation 3+/5                     [facility] Adult PT Treatment/Exercise - 08/21/16 0001      Shoulder Exercises: Standing   Other Standing Exercises UE ranger circles, flexion, abduction  20xeach direction   Other  Standing Exercises cone stack to 2nd shelf 2x1 min with 1# added     Shoulder Exercises: Pulleys   Flexion 3 minutes   ABduction 3 minutes     Shoulder Exercises: ROM/Strengthening   Ball on Wall circles 2x30 seconds     Shoulder Exercises: Stretch   Wall Stretch - Flexion Other (comment)   Wall Stretch - Flexion Limitations wall ladder using 1# flexion 2x10   Other Shoulder Stretches using pulleys into IR x 2 minutes                  PT Short Term Goals - 08/13/16 [id number]      PT SHORT TERM GOAL #1   Title be independent in initial HEP   Time 4   Period Weeks   Status Achieved     PT SHORT TERM GOAL #2   Title demonstrate Rt shoulder A/ROM flexion to 100 degrees to improve overhead reaching   Time 4   Period Weeks   Status Achieved     PT SHORT TERM GOAL #3   Title demonstrate 50 degrees P/ROM Rt shoulder ER to improve use  of Rt UE   Time 4   Period Weeks   Status Achieved     PT SHORT TERM GOAL #4   Title report < or = to 3/10 Rt shoulder pain with movement   Time 4   Period Weeks   Status Achieved           PT Long Term Goals - 08/21/16 0815      PT LONG TERM GOAL #1   Title be independent in advanced HEP   Time 8   Period Weeks   Status On-going     PT LONG TERM GOAL #2   Title reduce FOTO to < or = to 36% limitation   Time 8   Period Weeks   Status On-going     PT LONG TERM GOAL #3   Title demonstrate Rt shoulder A/ROM flexion to 120 degrees to improve overhead use   Time 8   Period Weeks   Status Revised     PT LONG TERM GOAL #4   Title improve strength and endurance to  report < or = to 90% use of Rt UE with ADLs and self-care   Time 8   Period Weeks   Status Revised     PT LONG TERM GOAL #5   Title demonstrate > or = to 4/5 Rt shoulder strength thoughout to improve use with home tasks and safe driving   Time 8   Period Weeks   Status Revised     Additional Long Term Goals   Additional Long Term Goals Yes     PT LONG TERM GOAL #6   Title report  <or = to 2/10 Rt shoulder pain with use with home tasks and self-care   Status Achieved     PT LONG TERM GOAL #7   Title demonstrate Rt shoulder A/ROM IR to L3 to improve self care   Time 8   Period Weeks   Status New               Plan - 08/21/16 0818    Clinical Impression Statement Pt is making steady progress wiht A/ROM and strength progression of the rt shoulder.  Pt demonstrates improved, yet still limited strength of the Rt UE.  A/ROM flexion to 112 degrees and IR to buttock.  Pt reports 80% use of Rt UE  and is limited with reaching overhead, lifting and endurance tasks.  FOTO is improved to 44% limitation (52% at evaluation).  Pt will continue to benefit from skilled PT for strength and endurance progression of Rt shoulder and A/ROM gains to improve functional use.     Rehab Potential Good   PT Frequency 2x  / week   PT Duration 8 weeks   PT Treatment/Interventions ADLs/Self Care Home Management;Cryotherapy;Electrical Stimulation;Functional mobility training;Moist Heat;Therapeutic activities;Therapeutic exercise;Neuromuscular re-education;Patient/family education;Passive range of motion;Manual techniques;Dry needling;Taping;Vasopneumatic Device   PT Next Visit Plan Strength as tolerated per MD.  Gentle strength and advance as tolerated.   Endurance, work on [employer].  Resume isometrics next session or begin [place] with yellow band.     Consulted and Agree with Plan of Care Patient      Patient will benefit from skilled therapeutic intervention in order to improve the following deficits and impairments:  Postural dysfunction, Decreased strength, Impaired flexibility, Pain, Decreased activity tolerance, Decreased endurance, Impaired UE functional use, Decreased range of motion  Visit Diagnosis: Stiffness of right shoulder, not elsewhere classified - Plan: PT plan of care cert/re-cert  Muscle weakness (generalized) - Plan: PT plan of care cert/re-cert  Acute pain of right shoulder - Plan: PT plan of care cert/re-cert     Problem List Patient Active Problem List   Diagnosis Date Noted  . Medicare annual wellness visit, subsequent 09/26/2014  . Hypothyroidism 07/01/2013  . Glaucoma 11/02/2012  . White coat hypertension 11/02/2012  . Vitamin D deficiency 11/02/2012  . Hyperlipidemia, mixed 07/24/2011  . Obesity (BMI 30-39.9) 07/24/2011  . hx: breast cancer, IDC (Microinvasive) w DCIS, 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 - 07/10/2011     Sigurd Sos, PT 08/21/16 12:46 PM  [facility] [address] [place], [place], [address] Phone: [phone]   Fax:  [phone]  Name: [name] MRN: [id number] Date of Birth: [birth year]

## 2016-08-24 ENCOUNTER — Ambulatory Visit: Payer: Medicare Other | Admitting: Physical Therapy

## 2016-08-24 ENCOUNTER — Encounter: Payer: Self-pay | Admitting: Physical Therapy

## 2016-08-24 DIAGNOSIS — M25511 Pain in right shoulder: Secondary | ICD-10-CM

## 2016-08-24 DIAGNOSIS — M25611 Stiffness of right shoulder, not elsewhere classified: Secondary | ICD-10-CM

## 2016-08-24 DIAGNOSIS — M6281 Muscle weakness (generalized): Secondary | ICD-10-CM

## 2016-08-24 NOTE — Patient Instructions (Signed)
                       Scapular Retraction: Elbow Flexion (Standing)   With elbows bent to 90, pinch shoulder blades together and rotate arms out, keeping elbows bent. Repeat _10___ times per set. Do _1___ sets per session. Do many____ sessions per day.    Strengthening: Resisted Extension   Hold tubing in right hand, arm forward. Pull arm back, elbow straight. Repeat _10___ times per set. Do _2___ sets per session. Do _1-2___ sessions per day.  Can place band around the front of your body and perform with both arms at the same time.   Then repeat the same exercise but bend your elbow and pull the elbow back.

## 2016-08-24 NOTE — Therapy (Signed)
[facility] [address] [place], [place], [address] Phone: [phone]   Fax:  [phone]  Physical Therapy Treatment  Patient Details  Name: [name] MRN: [id number] Date of Birth: 07-04-1941 Referring Provider: Kathryne Hitch, MD  Encounter Date: 08/24/2016      PT End of Session - 08/24/16 0805    Visit Number 18   Number of Visits 19   Date for PT Re-Evaluation 10-23-2016   Authorization Type G-codes at 7, [place] on all visits in 2017   PT Start Time 0804   PT Stop Time 0845   PT Time Calculation (min) 41 min   Activity Tolerance Patient tolerated treatment well   Behavior During Therapy [facility] for tasks assessed/performed      Past Medical History:  Diagnosis Date  . Abnormal thyroid function test 07/01/2013  . Arthritis   . Cancer ([place])   . Dermatitis 11/25/2013  . Dyslipidemia   . Glaucoma 11/02/2012  . HTN (hypertension) 11/02/2012  . 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 Hyperlipidemia   . Obesity     Past Surgical History:  Procedure Laterality Date  . BREAST SURGERY  10/2005   Left Mastectomy, reconstruction, reduction on right. Dr. Harlow Mares  . CRYOTHERAPY     GYN for CIN III  . DILATION AND CURETTAGE OF UTERUS    . EXCISION MORTON'S NEUROMA     right foot    There were no vitals filed for this visit.      Subjective Assessment - 08/24/16 0805    Subjective Reaching behind me is the hardest motion. 1# last session was challenging, was a litle sore next day but ok.    Currently in Pain? No/denies   Multiple Pain Sites No                         [facility] Adult PT Treatment/Exercise - 08/24/16 0001      Shoulder Exercises: Supine   Flexion Strengthening;Right;20 reps;Weights   Shoulder Flexion Weight (lbs) 1     Shoulder Exercises: Sidelying   External Rotation Strengthening;Right;20 reps;Weights   External Rotation Weight  (lbs) 1   External Rotation Limitations Tactile cues for proper motion   ABduction Strengthening;Right;20 reps;Weights   ABduction Weight (lbs) 1     Shoulder Exercises: Standing   Flexion AAROM;Strengthening;Right;10 reps  1# on finger ladder   Extension Strengthening;Right;20 reps;Theraband   Theraband Level (Shoulder Extension) Level 1 (Yellow)   Row Strengthening;Right;20 reps;Theraband   Theraband Level (Shoulder Row) Level 1 (Yellow)   Other Standing Exercises UE ranger circles, flexion, abduction  20xeach direction   Other Standing Exercises 1# to second shelf 2x15     Shoulder Exercises: Pulleys   Flexion 3 minutes   ABduction 3 minutes     Manual Therapy   Manual Therapy --  Rhythmic stabs slow just proximal to elbow 3x20 in all four                 PT Education - 08/24/16 0823    Education provided Yes   Education Details HEP : yellow band rows & extensions   Person(s) Educated Patient   Methods Explanation;Demonstration;Tactile cues;Verbal cues;Handout  issued band   Comprehension Verbalized understanding;Returned demonstration          PT Short Term Goals - 08/13/16 0814      PT SHORT TERM GOAL #1   Title  be independent in initial HEP   Time 4   Period Weeks   Status Achieved     PT SHORT TERM GOAL #2   Title demonstrate Rt shoulder A/ROM flexion to 100 degrees to improve overhead reaching   Time 4   Period Weeks   Status Achieved     PT SHORT TERM GOAL #3   Title demonstrate 50 degrees P/ROM Rt shoulder ER to improve use of Rt UE   Time 4   Period Weeks   Status Achieved     PT SHORT TERM GOAL #4   Title report < or = to 3/10 Rt shoulder pain with movement   Time 4   Period Weeks   Status Achieved           PT Long Term Goals - 08/21/16 0815      PT LONG TERM GOAL #1   Title be independent in advanced HEP   Time 8   Period Weeks   Status On-going     PT LONG TERM GOAL #2   Title reduce FOTO to < or = to 36% limitation    Time 8   Period Weeks   Status On-going     PT LONG TERM GOAL #3   Title demonstrate Rt shoulder A/ROM flexion to 120 degrees to improve overhead use   Time 8   Period Weeks   Status Revised     PT LONG TERM GOAL #4   Title improve strength and endurance to  report < or = to 90% use of Rt UE with ADLs and self-care   Time 8   Period Weeks   Status Revised     PT LONG TERM GOAL #5   Title demonstrate > or = to 4/5 Rt shoulder strength thoughout to improve use with home tasks and safe driving   Time 8   Period Weeks   Status Revised     Additional Long Term Goals   Additional Long Term Goals Yes     PT LONG TERM GOAL #6   Title report  <or = to 2/10 Rt shoulder pain with use with home tasks and self-care   Status Achieved     PT LONG TERM GOAL #7   Title demonstrate Rt shoulder A/ROM IR to L3 to improve self care   Time 8   Period Weeks   Status New               Plan - 08/24/16 0807    Clinical Impression Statement Focused on gentle strengthening, progressing from isometrics to standing and supine isotonic exercises using theraband and 1# weights.  Pt's HEP was advanced to include scapular stabilization exercises with yellow band.  The only difficulty demonstrated was in SL external rotation where pt had some challenges not usinf the wrist extensors to initiate the movement.    Rehab Potential Good   PT Frequency 2x / week   PT Duration 8 weeks   PT Treatment/Interventions ADLs/Self Care Home Management;Cryotherapy;Electrical Stimulation;Functional mobility training;Moist Heat;Therapeutic activities;Therapeutic exercise;Neuromuscular re-education;Patient/family education;Passive range of motion;Manual techniques;Dry needling;Taping;Vasopneumatic Device   PT Next Visit Plan G codes next visit, work on some gentle stretching behind the back and continue with PREs for shoulder strength.    Consulted and Agree with Plan of Care --      Patient will benefit from  skilled therapeutic intervention in order to improve the following deficits and impairments:  Postural dysfunction, Decreased strength, Impaired flexibility, Pain, Decreased activity tolerance,  Decreased endurance, Impaired UE functional use, Decreased range of motion  Visit Diagnosis: Stiffness of right shoulder, not elsewhere classified  Muscle weakness (generalized)  Acute pain of right shoulder     Problem List Patient Active Problem List   Diagnosis Date Noted  . Medicare annual wellness visit, subsequent 09/26/2014  . Hypothyroidism 07/01/2013  . Glaucoma 11/02/2012  . White coat hypertension 11/02/2012  . Vitamin D deficiency 11/02/2012  . Hyperlipidemia, mixed 07/24/2011  . Obesity (BMI 30-39.9) 07/24/2011  . hx: breast cancer, IDC (Microinvasive) w DCIS, 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 - 07/10/2011    COCHRAN,JENNIFER, PTA 08/24/2016, 8:49 AM  [facility] [address] [place], [place], [address] Phone: [phone]   Fax:  [phone]  Name: [name] MRN: [id number] Date of Birth: October 22, 1940

## 2016-08-27 ENCOUNTER — Encounter: Payer: Self-pay | Admitting: Physical Therapy

## 2016-08-27 ENCOUNTER — Ambulatory Visit: Payer: Medicare Other | Admitting: Physical Therapy

## 2016-08-27 DIAGNOSIS — M6281 Muscle weakness (generalized): Secondary | ICD-10-CM

## 2016-08-27 DIAGNOSIS — M25611 Stiffness of right shoulder, not elsewhere classified: Secondary | ICD-10-CM

## 2016-08-27 DIAGNOSIS — M25511 Pain in right shoulder: Secondary | ICD-10-CM

## 2016-08-27 NOTE — Therapy (Signed)
[facility] [address] [place], [place], [address] Phone: [phone]   Fax:  [phone]  Physical Therapy Treatment  Patient Details  Name: [name] MRN: [id number] Date of Birth: 12-Apr-1941 Referring Provider: Kathryne Hitch, MD  Encounter Date: 08/27/2016      PT End of Session - 08/27/16 0804    Visit Number 19   Number of Visits 29   Date for PT Re-Evaluation 10/17/2016   Authorization Type G-codes at 27, [place] on all visits in 2017   PT Start Time 0805   PT Stop Time 0845   PT Time Calculation (min) 40 min   Activity Tolerance Patient tolerated treatment well   Behavior During Therapy [facility] for tasks assessed/performed      Past Medical History:  Diagnosis Date  . Abnormal thyroid function test 07/01/2013  . Arthritis   . Cancer ([place])   . Dermatitis 11/25/2013  . Dyslipidemia   . Glaucoma 11/02/2012  . HTN (hypertension) 11/02/2012  . 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 Hyperlipidemia   . Obesity     Past Surgical History:  Procedure Laterality Date  . BREAST SURGERY  10/2005   Left Mastectomy, reconstruction, reduction on right. Dr. Harlow Mares  . CRYOTHERAPY     GYN for CIN III  . DILATION AND CURETTAGE OF UTERUS    . EXCISION MORTON'S NEUROMA     right foot    There were no vitals filed for this visit.      Subjective Assessment - 08/27/16 0806    Subjective Reports she was sore on the side of shoulder since Friday but it feels like muscle soreness and has gotten better.     Pertinent History breast cancer: no [place], no UBE, no heat to Lt shoulder.  Rt humerus fracture: 05/16/16.  MD OK with strength progression as tolerated (08/21/16)   Currently in Pain? Yes   Pain Score 2    Pain Location Shoulder            [facility] PT Assessment - 08/27/16 0001      AROM   Right Shoulder Flexion 120 Degrees   Right Shoulder ABduction 100 Degrees     Strength   Overall Strength Deficits   Strength Assessment Site Shoulder   Right/Left Shoulder Right   Right Shoulder Flexion 4/5   Right Shoulder Extension 5/5   Right Shoulder ABduction 4-/5   Right Shoulder Internal Rotation 4/5   Right Shoulder External Rotation 3+/5                     [facility] Adult PT Treatment/Exercise - 08/27/16 0001      Shoulder Exercises: Sidelying   External Rotation Strengthening;Right  30 reps     Shoulder Exercises: Standing   Extension Strengthening;Right;20 reps;Theraband   Theraband Level (Shoulder Extension) Level 1 (Yellow)   Row Strengthening;Right;20 reps;Theraband   Theraband Level (Shoulder Row) Level 1 (Yellow)   Other Standing Exercises shoulder stab circles 20x both ways with towel   Other Standing Exercises 1# to second shelf 3x10     Shoulder Exercises: Pulleys   Flexion 3 minutes   ABduction 3 minutes   Other Pulley Exercises internal rotation 20x     Manual Therapy   Manual Therapy Joint mobilization;Passive ROM   Joint Mobilization A/P mobs grade II   Passive ROM To Rt shoulder in internal rotation  PT Short Term Goals - 08/27/16 0809      PT SHORT TERM GOAL #1   Title be independent in initial HEP   Time 4   Period Weeks   Status Achieved     PT SHORT TERM GOAL #2   Title demonstrate Rt shoulder A/ROM flexion to 100 degrees to improve overhead reaching   Time 4   Period Weeks   Status Achieved     PT SHORT TERM GOAL #3   Title demonstrate 50 degrees P/ROM Rt shoulder ER to improve use of Rt UE   Time 4   Period Weeks   Status Achieved     PT SHORT TERM GOAL #4   Title report < or = to 3/10 Rt shoulder pain with movement   Time 4   Period Weeks   Status Achieved           PT Long Term Goals - 08/27/16 0809      PT LONG TERM GOAL #1   Title be independent in advanced HEP   Time 8   Period Weeks   Status On-going     PT LONG TERM GOAL #2   Title reduce FOTO  to < or = to 36% limitation   Baseline 44%   Time 8   Period Weeks   Status On-going     PT LONG TERM GOAL #3   Title demonstrate Rt shoulder A/ROM flexion to 120 degrees to improve overhead use   Baseline 120 degrees   Time 8   Period Weeks   Status Achieved     PT LONG TERM GOAL #4   Title improve strength and endurance to  report < or = to 90% use of Rt UE with ADLs and self-care   Baseline reports 80% use at this time   Time 8   Period Weeks   Status On-going     PT LONG TERM GOAL #5   Title demonstrate > or = to 4/5 Rt shoulder strength thoughout to improve use with home tasks and safe driving   Baseline 4-/5 abduction, 3+/5 external rotation   Time 8   Period Weeks   Status Partially Met     PT LONG TERM GOAL #6   Title report  <or = to 2/10 Rt shoulder pain with use with home tasks and self-care   Time 8   Period Weeks   Status Achieved     PT LONG TERM GOAL #7   Title demonstrate Rt shoulder A/ROM IR to L3 to improve self care   Time 8   Period Weeks   Status On-going               Plan - 08/27/16 1120    Clinical Impression Statement Patient able to progress weight with flexion lifting to second shelf.  Pt still struggles with internal rotation and shoulder weakness.  She needs cues not to overuse uppertraps during functional movments.  Pt did FOTO assessment recently.  She shows improved strength overall.  She will benefit from continues skilled PT for improvements in functional movements and increased strength in order to return to household   Rehab Potential Good   PT Frequency 2x / week   PT Duration 8 weeks   PT Treatment/Interventions ADLs/Self Care Home Management;Cryotherapy;Electrical Stimulation;Functional mobility training;Moist Heat;Therapeutic activities;Therapeutic exercise;Neuromuscular re-education;Patient/family education;Passive range of motion;Manual techniques;Dry needling;Taping;Vasopneumatic Device   PT Next Visit Plan continue with  gentle stretching behind the back and PREs for shoulder strength.  Consulted and Agree with Plan of Care Patient      Patient will benefit from skilled therapeutic intervention in order to improve the following deficits and impairments:  Postural dysfunction, Decreased strength, Impaired flexibility, Pain, Decreased activity tolerance, Decreased endurance, Impaired UE functional use, Decreased range of motion  Visit Diagnosis: Stiffness of right shoulder, not elsewhere classified  Muscle weakness (generalized)  Acute pain of right shoulder       G-Codes - 2016/09/21 1125    Functional Assessment Tool Used FOTO 44% limitation and clinical impression   Functional Limitation Other PT primary   Other PT Primary Current Status (Q9476) At least 40 percent but less than 60 percent impaired, limited or restricted   Other PT Primary Goal Status (L4650) At least 20 percent but less than 40 percent impaired, limited or restricted      Problem List Patient Active Problem List   Diagnosis Date Noted  . Medicare annual wellness visit, subsequent 09/26/2014  . Hypothyroidism 07/01/2013  . Glaucoma 11/02/2012  . White coat hypertension 11/02/2012  . Vitamin D deficiency 11/02/2012  . Hyperlipidemia, mixed 07/24/2011  . Obesity (BMI 30-39.9) 07/24/2011  . hx: breast cancer, IDC (Microinvasive) w DCIS, 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 - 07/10/2011    Zannie Cove, PT 09/21/16, 11:30 AM  [facility] [address], [place], [place], [address] Phone: [phone]   Fax:  [phone]  Name: [name] MRN: [id number] Date of Birth: 06/08/1941

## 2016-08-30 ENCOUNTER — Ambulatory Visit: Payer: Medicare Other | Admitting: Physical Therapy

## 2016-08-30 ENCOUNTER — Encounter: Payer: Self-pay | Admitting: Physical Therapy

## 2016-08-30 DIAGNOSIS — M25611 Stiffness of right shoulder, not elsewhere classified: Secondary | ICD-10-CM

## 2016-08-30 DIAGNOSIS — M25511 Pain in right shoulder: Secondary | ICD-10-CM

## 2016-08-30 DIAGNOSIS — M6281 Muscle weakness (generalized): Secondary | ICD-10-CM

## 2016-08-30 NOTE — Therapy (Signed)
[facility] [address] [place], [place], [address] Phone: [phone]   Fax:  [phone]  Physical Therapy Treatment  Patient Details  Name: [name] MRN: [id number] Date of Birth: October 23, 1940 Referring Provider: Kathryne Hitch, MD  Encounter Date: 08/30/2016      PT End of Session - 08/30/16 0803    Visit Number 20   Number of Visits 29   Date for PT Re-Evaluation 27-Oct-2016   Authorization Type G-codes at 34, [place] on all visits in 2017   PT Start Time 0800   PT Stop Time 0840   PT Time Calculation (min) 40 min   Activity Tolerance Patient tolerated treatment well   Behavior During Therapy [facility] for tasks assessed/performed      Past Medical History:  Diagnosis Date  . Abnormal thyroid function test 07/01/2013  . Arthritis   . Cancer ([place])   . Dermatitis 11/25/2013  . Dyslipidemia   . Glaucoma 11/02/2012  . HTN (hypertension) 11/02/2012  . 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 Hyperlipidemia   . Obesity     Past Surgical History:  Procedure Laterality Date  . BREAST SURGERY  10/2005   Left Mastectomy, reconstruction, reduction on right. Dr. Harlow Mares  . CRYOTHERAPY     GYN for CIN III  . DILATION AND CURETTAGE OF UTERUS    . EXCISION MORTON'S NEUROMA     right foot    There were no vitals filed for this visit.      Subjective Assessment - 08/30/16 0802    Subjective Pt reports shoulder feeling fine. Denies pain, just soreness.   Pertinent History breast cancer: no [place], no UBE, no heat to Lt shoulder.  Rt humerus fracture: 05/16/16.  MD OK with strength progression as tolerated (08/21/16)   Diagnostic tests x-ray: proximal humerus fracture   Patient Stated Goals improve use of Rt UE, imporve A/ROM and strength,   Currently in Pain? No/denies                         [facility] Adult PT Treatment/Exercise - 08/30/16 0001      Shoulder  Exercises: Sidelying   External Rotation Strengthening;Right  30 reps     Shoulder Exercises: Standing   Extension Strengthening;Right;20 reps;Theraband   Theraband Level (Shoulder Extension) Level 2 (Red)   Row Strengthening;Right;20 reps;Theraband   Theraband Level (Shoulder Row) Level 2 (Red)   Other Standing Exercises UE ranger circles, flexion, abduction  20xeach direction   Other Standing Exercises 2# to second shelf 3x10  weight     Shoulder Exercises: Pulleys   Flexion 3 minutes   ABduction 3 minutes     Shoulder Exercises: ROM/Strengthening   Other ROM/Strengthening Exercises Internal external rotation isometric  at wall x20 each     Shoulder Exercises: Stretch   Other Shoulder Stretches Behind the back stretch: with cane and towel      Manual Therapy   Manual Therapy Joint mobilization;Passive ROM   Joint Mobilization A/P mobs grade II   Passive ROM Rt shoulder all planes                  PT Short Term Goals - 08/27/16 0809      PT SHORT TERM GOAL #1   Title be independent in initial HEP   Time 4   Period Weeks   Status Achieved     PT  SHORT TERM GOAL #2   Title demonstrate Rt shoulder A/ROM flexion to 100 degrees to improve overhead reaching   Time 4   Period Weeks   Status Achieved     PT SHORT TERM GOAL #3   Title demonstrate 50 degrees P/ROM Rt shoulder ER to improve use of Rt UE   Time 4   Period Weeks   Status Achieved     PT SHORT TERM GOAL #4   Title report < or = to 3/10 Rt shoulder pain with movement   Time 4   Period Weeks   Status Achieved           PT Long Term Goals - 08/27/16 0809      PT LONG TERM GOAL #1   Title be independent in advanced HEP   Time 8   Period Weeks   Status On-going     PT LONG TERM GOAL #2   Title reduce FOTO to < or = to 36% limitation   Baseline 44%   Time 8   Period Weeks   Status On-going     PT LONG TERM GOAL #3   Title demonstrate Rt shoulder A/ROM flexion to 120 degrees to  improve overhead use   Baseline 120 degrees   Time 8   Period Weeks   Status Achieved     PT LONG TERM GOAL #4   Title improve strength and endurance to  report < or = to 90% use of Rt UE with ADLs and self-care   Baseline reports 80% use at this time   Time 8   Period Weeks   Status On-going     PT LONG TERM GOAL #5   Title demonstrate > or = to 4/5 Rt shoulder strength thoughout to improve use with home tasks and safe driving   Baseline 4-/5 abduction, 3+/5 external rotation   Time 8   Period Weeks   Status Partially Met     PT LONG TERM GOAL #6   Title report  <or = to 2/10 Rt shoulder pain with use with home tasks and self-care   Time 8   Period Weeks   Status Achieved     PT LONG TERM GOAL #7   Title demonstrate Rt shoulder A/ROM IR to L3 to improve self care   Time 8   Period Weeks   Status On-going               Plan - 08/30/16 0569    Clinical Impression Statement Pt continues to be limited with shoulder ROM in internal rotation. Does very well with flexion and strengthening in frontal plane. Pt will continue to benefit from skilled therapy for shoulder strength, stability, and ROM.    Rehab Potential Good   PT Frequency 2x / week   PT Duration 8 weeks   PT Treatment/Interventions ADLs/Self Care Home Management;Cryotherapy;Electrical Stimulation;Functional mobility training;Moist Heat;Therapeutic activities;Therapeutic exercise;Neuromuscular re-education;Patient/family education;Passive range of motion;Manual techniques;Dry needling;Taping;Vasopneumatic Device   PT Next Visit Plan continue with gentle stretching behind the back and PREs for shoulder strength.    Consulted and Agree with Plan of Care Patient      Patient will benefit from skilled therapeutic intervention in order to improve the following deficits and impairments:  Postural dysfunction, Decreased strength, Impaired flexibility, Pain, Decreased activity tolerance, Decreased endurance, Impaired  UE functional use, Decreased range of motion  Visit Diagnosis: Stiffness of right shoulder, not elsewhere classified  Muscle weakness (generalized)  Acute pain of right  shoulder     Problem List Patient Active Problem List   Diagnosis Date Noted  . Medicare annual wellness visit, subsequent 09/26/2014  . Hypothyroidism 07/01/2013  . Glaucoma 11/02/2012  . White coat hypertension 11/02/2012  . Vitamin D deficiency 11/02/2012  . Hyperlipidemia, mixed 07/24/2011  . Obesity (BMI 30-39.9) 07/24/2011  . hx: breast cancer, IDC (Microinvasive) w DCIS, 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 - 07/10/2011    Mikle Bosworth PTA 08/30/2016, 8:57 AM  [facility] [address], [place], [place], [address] Phone: [phone]   Fax:  [phone]  Name: JENNIAH BHAVSAR MRN: [id number] Date of Birth: [birth year]

## 2016-09-04 ENCOUNTER — Ambulatory Visit: Payer: Medicare Other | Admitting: Physical Therapy

## 2016-09-04 DIAGNOSIS — M6281 Muscle weakness (generalized): Secondary | ICD-10-CM

## 2016-09-04 DIAGNOSIS — M25611 Stiffness of right shoulder, not elsewhere classified: Secondary | ICD-10-CM

## 2016-09-04 DIAGNOSIS — M25511 Pain in right shoulder: Secondary | ICD-10-CM

## 2016-09-04 NOTE — Therapy (Signed)
[facility] [address], [place], [place], [address] Phone: [phone]   Fax:  [phone]  Physical Therapy Treatment  Patient Details  Name: [name] MRN: [id number] Date of Birth: 1940/11/19 Referring Provider: Kathryne Hitch, MD  Encounter Date: 09/04/2016      PT End of Session - 09/04/16 0838    Visit Number 21   Number of Visits 29   Date for PT Re-Evaluation 01-Nov-2016   Authorization Type G-codes at 73, [place] on all visits in 2017   PT Start Time 0810   PT Stop Time 0845   PT Time Calculation (min) 35 min   Activity Tolerance Patient tolerated treatment well      Past Medical History:  Diagnosis Date  . Abnormal thyroid function test 07/01/2013  . Arthritis   . Cancer ([place])   . Dermatitis 11/25/2013  . Dyslipidemia   . Glaucoma 11/02/2012  . HTN (hypertension) 11/02/2012  . 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 Hyperlipidemia   . Obesity     Past Surgical History:  Procedure Laterality Date  . BREAST SURGERY  10/2005   Left Mastectomy, reconstruction, reduction on right. Dr. Harlow Mares  . CRYOTHERAPY     GYN for CIN III  . DILATION AND CURETTAGE OF UTERUS    . EXCISION MORTON'S NEUROMA     right foot    There were no vitals filed for this visit.      Subjective Assessment - 09/04/16 0814    Subjective Arrives 10 min late.  Reports general soreness from using her arm.     Currently in Pain? Yes   Pain Score 3    Pain Location Shoulder   Pain Orientation Right   Pain Type Acute pain            [facility] PT Assessment - 09/04/16 0001      AROM   Right Shoulder Flexion 127 Degrees   Right Shoulder ABduction 120 Degrees   Right Shoulder Internal Rotation --  right lateral buttock   Right Shoulder External Rotation 60 Degrees     Strength   Right Shoulder Flexion 4/5   Right Shoulder ABduction 4-/5   Right Shoulder Internal Rotation 4/5    Right Shoulder External Rotation 3+/5                     [facility] Adult PT Treatment/Exercise - 09/04/16 0001      Shoulder Exercises: Standing   External Rotation Strengthening;Right;20 reps;Theraband   Theraband Level (Shoulder External Rotation) Level 1 (Yellow)   Internal Rotation Strengthening;Right;20 reps;Theraband   Theraband Level (Shoulder Internal Rotation) Level 1 (Yellow)   Extension Strengthening;Right;20 reps;Theraband   Theraband Level (Shoulder Extension) Level 1 (Yellow)   Row Strengthening;Right;20 reps;Theraband   Theraband Level (Shoulder Row) Level 1 (Yellow)   Other Standing Exercises UE Ranger on floor, on wall level 1 and 3 10x each   Other Standing Exercises UE Ranger IR behind back 2x10                  PT Short Term Goals - 09/04/16 1616      PT SHORT TERM GOAL #1   Title be independent in initial HEP   Status Achieved     PT SHORT TERM GOAL #2   Title demonstrate Rt shoulder A/ROM flexion to 100 degrees to improve overhead reaching   Status Achieved  PT SHORT TERM GOAL #3   Title demonstrate 50 degrees P/ROM Rt shoulder ER to improve use of Rt UE   Status Achieved     PT SHORT TERM GOAL #4   Title report < or = to 3/10 Rt shoulder pain with movement   Status Achieved           PT Long Term Goals - 09/04/16 1616      PT LONG TERM GOAL #1   Title be independent in advanced HEP   Time 8   Period Weeks   Status On-going     PT LONG TERM GOAL #2   Title reduce FOTO to < or = to 36% limitation   Time 8   Period Weeks   Status On-going     PT LONG TERM GOAL #3   Title demonstrate Rt shoulder A/ROM flexion to 120 degrees to improve overhead use   Status Achieved     PT LONG TERM GOAL #4   Title improve strength and endurance to  report < or = to 90% use of Rt UE with ADLs and self-care   Time 8   Period Weeks   Status On-going     PT LONG TERM GOAL #5   Title demonstrate > or = to 4/5 Rt shoulder  strength thoughout to improve use with home tasks and safe driving   Time 8   Period Weeks   Status Partially Met     PT LONG TERM GOAL #6   Title report  <or = to 2/10 Rt shoulder pain with use with home tasks and self-care   Status Achieved     PT LONG TERM GOAL #7   Title demonstrate Rt shoulder A/ROM IR to L3 to improve self care   Time 8   Period Weeks   Status On-going               Plan - 09/04/16 1611    Clinical Impression Statement The patient arrives 10 min late.  She reports general shoulder soreness from more functional use.  She is able to perform UE elevation without increase in pain with active -assisted ROM using UE Ranger.  Difficulty with internal rotation (behind the back motion).  Improved active ROM with both flexion and abduction.  Therapist closely monitoring response with all and modifying as needed.     PT Next Visit Plan continue with gentle stretching behind the back and PREs for shoulder strength.       Patient will benefit from skilled therapeutic intervention in order to improve the following deficits and impairments:     Visit Diagnosis: Stiffness of right shoulder, not elsewhere classified  Muscle weakness (generalized)  Acute pain of right shoulder     Problem List Patient Active Problem List   Diagnosis Date Noted  . Medicare annual wellness visit, subsequent 09/26/2014  . Hypothyroidism 07/01/2013  . Glaucoma 11/02/2012  . White coat hypertension 11/02/2012  . Vitamin D deficiency 11/02/2012  . Hyperlipidemia, mixed 07/24/2011  . Obesity (BMI 30-39.9) 07/24/2011  . hx: breast cancer, IDC (Microinvasive) w DCIS, 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 - 07/10/2011    [name] 09/04/2016, 4:17 PM  [facility] [address] [place], [place], [address] Phone: [phone]   Fax:  [phone]  Name: [name] MRN: [id number] Date of Birth: 12/22/1940

## 2016-09-04 NOTE — Therapy (Signed)
[facility] [address], [place], [place], [address] Phone: [phone]   Fax:  [phone]  Physical Therapy Treatment  Patient Details  Name: [name] MRN: [id number] Date of Birth: 1940/11/19 Referring Provider: Kathryne Hitch, MD  Encounter Date: 09/04/2016      PT End of Session - 09/04/16 0838    Visit Number 21   Number of Visits 29   Date for PT Re-Evaluation 01-Nov-2016   Authorization Type G-codes at 73, [place] on all visits in 2017   PT Start Time 0810   PT Stop Time 0845   PT Time Calculation (min) 35 min   Activity Tolerance Patient tolerated treatment well      Past Medical History:  Diagnosis Date  . Abnormal thyroid function test 07/01/2013  . Arthritis   . Cancer ([place])   . Dermatitis 11/25/2013  . Dyslipidemia   . Glaucoma 11/02/2012  . HTN (hypertension) 11/02/2012  . 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 Hyperlipidemia   . Obesity     Past Surgical History:  Procedure Laterality Date  . BREAST SURGERY  10/2005   Left Mastectomy, reconstruction, reduction on right. Dr. Harlow Mares  . CRYOTHERAPY     GYN for CIN III  . DILATION AND CURETTAGE OF UTERUS    . EXCISION MORTON'S NEUROMA     right foot    There were no vitals filed for this visit.      Subjective Assessment - 09/04/16 0814    Subjective Arrives 10 min late.  Reports general soreness from using her arm.     Currently in Pain? Yes   Pain Score 3    Pain Location Shoulder   Pain Orientation Right   Pain Type Acute pain            [facility] PT Assessment - 09/04/16 0001      AROM   Right Shoulder Flexion 127 Degrees   Right Shoulder ABduction 120 Degrees   Right Shoulder Internal Rotation --  right lateral buttock   Right Shoulder External Rotation 60 Degrees     Strength   Right Shoulder Flexion 4/5   Right Shoulder ABduction 4-/5   Right Shoulder Internal Rotation 4/5    Right Shoulder External Rotation 3+/5                     [facility] Adult PT Treatment/Exercise - 09/04/16 0001      Shoulder Exercises: Standing   External Rotation Strengthening;Right;20 reps;Theraband   Theraband Level (Shoulder External Rotation) Level 1 (Yellow)   Internal Rotation Strengthening;Right;20 reps;Theraband   Theraband Level (Shoulder Internal Rotation) Level 1 (Yellow)   Extension Strengthening;Right;20 reps;Theraband   Theraband Level (Shoulder Extension) Level 1 (Yellow)   Row Strengthening;Right;20 reps;Theraband   Theraband Level (Shoulder Row) Level 1 (Yellow)   Other Standing Exercises UE Ranger on floor, on wall level 1 and 3 10x each   Other Standing Exercises UE Ranger IR behind back 2x10                  PT Short Term Goals - 09/04/16 1616      PT SHORT TERM GOAL #1   Title be independent in initial HEP   Status Achieved     PT SHORT TERM GOAL #2   Title demonstrate Rt shoulder A/ROM flexion to 100 degrees to improve overhead reaching   Status Achieved  PT SHORT TERM GOAL #3   Title demonstrate 50 degrees P/ROM Rt shoulder ER to improve use of Rt UE   Status Achieved     PT SHORT TERM GOAL #4   Title report < or = to 3/10 Rt shoulder pain with movement   Status Achieved           PT Long Term Goals - 09/04/16 1616      PT LONG TERM GOAL #1   Title be independent in advanced HEP   Time 8   Period Weeks   Status On-going     PT LONG TERM GOAL #2   Title reduce FOTO to < or = to 36% limitation   Time 8   Period Weeks   Status On-going     PT LONG TERM GOAL #3   Title demonstrate Rt shoulder A/ROM flexion to 120 degrees to improve overhead use   Status Achieved     PT LONG TERM GOAL #4   Title improve strength and endurance to  report < or = to 90% use of Rt UE with ADLs and self-care   Time 8   Period Weeks   Status On-going     PT LONG TERM GOAL #5   Title demonstrate > or = to 4/5 Rt shoulder  strength thoughout to improve use with home tasks and safe driving   Time 8   Period Weeks   Status Partially Met     PT LONG TERM GOAL #6   Title report  <or = to 2/10 Rt shoulder pain with use with home tasks and self-care   Status Achieved     PT LONG TERM GOAL #7   Title demonstrate Rt shoulder A/ROM IR to L3 to improve self care   Time 8   Period Weeks   Status On-going               Plan - 09/04/16 1611    Clinical Impression Statement The patient arrives 10 min late.  She reports general shoulder soreness from more functional use.  She is able to perform UE elevation without increase in pain with active -assisted ROM using UE Ranger.  Difficulty with internal rotation (behind the back motion).  Improved active ROM with both flexion and abduction.  Therapist closely monitoring response with all and modifying as needed.     PT Next Visit Plan continue with gentle stretching behind the back and PREs for shoulder strength.       Patient will benefit from skilled therapeutic intervention in order to improve the following deficits and impairments:     Visit Diagnosis: Stiffness of right shoulder, not elsewhere classified  Muscle weakness (generalized)  Acute pain of right shoulder     Problem List Patient Active Problem List   Diagnosis Date Noted  . Medicare annual wellness visit, subsequent 09/26/2014  . Hypothyroidism 07/01/2013  . Glaucoma 11/02/2012  . White coat hypertension 11/02/2012  . Vitamin D deficiency 11/02/2012  . Hyperlipidemia, mixed 07/24/2011  . Obesity (BMI 30-39.9) 07/24/2011  . hx: breast cancer, IDC (Microinvasive) w DCIS, 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 - 07/10/2011   Ruben Im, PT 09/04/16 4:19 PM Phone: [phone] Fax: [phone]  [name] 09/04/2016, 4:19 PM  [facility] [address] [place], [place], [address] Phone: [phone]   Fax:  [phone]  Name:  [name] MRN: [id number] Date of Birth: 1941/02/11

## 2016-09-05 NOTE — Telephone Encounter (Signed)
Patient states that she is still trying to get path report. Records will be in "records reviewed" folder

## 2016-09-06 ENCOUNTER — Ambulatory Visit: Payer: Medicare Other

## 2016-09-06 DIAGNOSIS — M6281 Muscle weakness (generalized): Secondary | ICD-10-CM

## 2016-09-06 DIAGNOSIS — M25611 Stiffness of right shoulder, not elsewhere classified: Secondary | ICD-10-CM | POA: Diagnosis not present

## 2016-09-06 DIAGNOSIS — M25511 Pain in right shoulder: Secondary | ICD-10-CM

## 2016-09-06 NOTE — Therapy (Addendum)
[facility] [address], [place], [place], [address] Phone: [phone]   Fax:  [phone]  Physical Therapy Treatment  Patient Details  Name: [name] MRN: [id number] Date of Birth: [birth year] Referring Provider: Kathryne Hitch, MD  Encounter Date: 09/06/2016      PT End of Session - 09/06/16 0844    Visit Number 22   Number of Visits 29   Date for PT Re-Evaluation 11-09-2016   Authorization Type G-codes at 64   PT Start Time 0809  Pt late   PT Stop Time 0845   PT Time Calculation (min) 36 min   Activity Tolerance Patient tolerated treatment well   Behavior During Therapy [facility] for tasks assessed/performed      Past Medical History:  Diagnosis Date  . Abnormal thyroid function test 07/01/2013  . Arthritis   . Cancer ([place])   . Dermatitis 11/25/2013  . Dyslipidemia   . Glaucoma 11/02/2012  . HTN (hypertension) 11/02/2012  . 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 Hyperlipidemia   . Obesity     Past Surgical History:  Procedure Laterality Date  . BREAST SURGERY  10/2005   Left Mastectomy, reconstruction, reduction on right. Dr. Harlow Mares  . CRYOTHERAPY     GYN for CIN III  . DILATION AND CURETTAGE OF UTERUS    . EXCISION MORTON'S NEUROMA     right foot    There were no vitals filed for this visit.      Subjective Assessment - 09/06/16 0814    Subjective No pain.  Doing well with exercises   Pertinent History breast cancer: no [place], no UBE, no heat to Lt shoulder.  Rt humerus fracture: 05/16/16.  MD OK with strength progression as tolerated (08/21/16)   Patient Stated Goals improve use of Rt UE, imporve A/ROM and strength,   Currently in Pain? No/denies                         [facility] Adult PT Treatment/Exercise - 09/06/16 0001      Shoulder Exercises: Supine   Horizontal ABduction AAROM;Right;20 reps  to improve ability to put on  deodorant with Rt UE     Shoulder Exercises: Seated   Flexion Strengthening;Right;20 reps  emphasis on reducing scapular elevation   Abduction Strengthening;Right;20 reps   ABduction Limitations scaption 2x10     Shoulder Exercises: Standing   External Rotation Strengthening;Right;20 reps;Theraband   Theraband Level (Shoulder External Rotation) Level 1 (Yellow)   Internal Rotation Strengthening;Right;20 reps;Theraband   Theraband Level (Shoulder Internal Rotation) Level 1 (Yellow)   Extension Strengthening;Right;20 reps;Theraband   Theraband Level (Shoulder Extension) Level 1 (Yellow)   Row Strengthening;Right;20 reps;Theraband   Theraband Level (Shoulder Row) Level 1 (Yellow)   Other Standing Exercises UE Ranger on floor, on wall level 3 10x each- circles bil, scaption and flexion   Other Standing Exercises --     Shoulder Exercises: Pulleys   Flexion 3 minutes   ABduction 3 minutes                PT Education - 09/06/16 0832    Education provided Yes   Education Details horizontal abduction with cane   Person(s) Educated Patient   Methods Explanation;Demonstration;Handout   Comprehension Verbalized understanding;Returned demonstration          PT Short Term Goals - 09/04/16 1616      PT SHORT  TERM GOAL #1   Title be independent in initial HEP   Status Achieved     PT SHORT TERM GOAL #2   Title demonstrate Rt shoulder A/ROM flexion to 100 degrees to improve overhead reaching   Status Achieved     PT SHORT TERM GOAL #3   Title demonstrate 50 degrees P/ROM Rt shoulder ER to improve use of Rt UE   Status Achieved     PT SHORT TERM GOAL #4   Title report < or = to 3/10 Rt shoulder pain with movement   Status Achieved           PT Long Term Goals - 09/06/16 [id number]      PT LONG TERM GOAL #1   Title be independent in advanced HEP   Time 8   Period Weeks   Status On-going     PT LONG TERM GOAL #2   Title reduce FOTO to < or = to 36% limitation    Time 8   Period Weeks   Status On-going     PT LONG TERM GOAL #3   Title demonstrate Rt shoulder A/ROM flexion to 120 degrees to improve overhead use   Status Achieved     PT LONG TERM GOAL #4   Title improve strength and endurance to  report < or = to 90% use of Rt UE with ADLs and self-care   Time 8   Period Weeks   Status On-going     PT LONG TERM GOAL #5   Title demonstrate > or = to 4/5 Rt shoulder strength thoughout to improve use with home tasks and safe driving   Time 8   Period Weeks   Status On-going     PT LONG TERM GOAL #6   Title report  <or = to 2/10 Rt shoulder pain with use with home tasks and self-care   Status Achieved     PT LONG TERM GOAL #7   Title demonstrate Rt shoulder A/ROM IR to L3 to improve self care   Time 8   Period Weeks   Status On-going               Plan - 09/06/16 0816    Clinical Impression Statement Pt is able to tolerate advanced exercise in the clinic with gentle strengthening.  Pt with scapular elevation at times and requires tactile and verbal cues.  Pt with reduced IR A/ROM and limited functional strength s/p humerus fracture.  Pt will continue to benefit from skilled PT for Rt shoulder strength, endurance and A/ROM.     Rehab Potential Good   PT Frequency 2x / week   PT Duration 8 weeks   PT Treatment/Interventions ADLs/Self Care Home Management;Cryotherapy;Electrical Stimulation;Functional mobility training;Moist Heat;Therapeutic activities;Therapeutic exercise;Neuromuscular re-education;Patient/family education;Passive range of motion;Manual techniques;Dry needling;Taping;Vasopneumatic Device   PT Next Visit Plan continue with gentle stretching behind the back and PREs for shoulder strength.    Consulted and Agree with Plan of Care Patient      Patient will benefit from skilled therapeutic intervention in order to improve the following deficits and impairments:  Postural dysfunction, Decreased strength, Impaired  flexibility, Pain, Decreased activity tolerance, Decreased endurance, Impaired UE functional use, Decreased range of motion  Visit Diagnosis: Stiffness of right shoulder, not elsewhere classified  Muscle weakness (generalized)  Acute pain of right shoulder     Problem List Patient Active Problem List   Diagnosis Date Noted  . Medicare annual wellness visit, subsequent 09/26/2014  .  Hypothyroidism 07/01/2013  . Glaucoma 11/02/2012  . White coat hypertension 11/02/2012  . Vitamin D deficiency 11/02/2012  . Hyperlipidemia, mixed 07/24/2011  . Obesity (BMI 30-39.9) 07/24/2011  . hx: breast cancer, IDC (Microinvasive) w DCIS, 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 - 07/10/2011     Sigurd Sos, PT 09/06/16 8:48 AM  [facility] [address], [place], [place], [address] Phone: [phone]   Fax:  [phone]  Name: JAQUASHA GOTTS MRN: [id number] Date of Birth: 1941-02-10

## 2016-09-06 NOTE — Patient Instructions (Addendum)
.  shouROM: Horizontal Abduction / Adduction - Wand    Keeping both palms down, push right hand across body with other hand. Then pull back across body, keeping arms parallel to floor. Do not allow trunk to twist. Hold _5___ seconds. Repeat _10___ times per set. Do _2___ sets per session. Do _3___ sessions per day.  http://orth.exer.us/752   Copyright  VHI. All rights reserved.  [facility] [address] [place], [place] [address] Phone # [phone] Fax [phone]

## 2016-09-07 ENCOUNTER — Encounter: Payer: Medicare Other | Admitting: Physical Therapy

## 2016-09-11 ENCOUNTER — Ambulatory Visit: Payer: Medicare Other | Attending: Orthopedic Surgery | Admitting: Physical Therapy

## 2016-09-11 DIAGNOSIS — M25611 Stiffness of right shoulder, not elsewhere classified: Secondary | ICD-10-CM | POA: Insufficient documentation

## 2016-09-11 DIAGNOSIS — M25511 Pain in right shoulder: Secondary | ICD-10-CM | POA: Diagnosis present

## 2016-09-11 DIAGNOSIS — M6281 Muscle weakness (generalized): Secondary | ICD-10-CM | POA: Insufficient documentation

## 2016-09-11 NOTE — Therapy (Signed)
[facility] [address] [place], [place], [address] Phone: [phone]   Fax:  [phone]  Physical Therapy Treatment  Patient Details  Name: [name] MRN: [id number] Date of Birth: 1941-04-17 Referring Provider: Kathryne Hitch, MD  Encounter Date: 09/11/2016      PT End of Session - 09/11/16 0827    Visit Number 23   Number of Visits 29   Date for PT Re-Evaluation 2016/11/10   Authorization Type G-codes at 67   PT Start Time 0801   PT Stop Time 0849   PT Time Calculation (min) 48 min   Activity Tolerance Patient tolerated treatment well      Past Medical History:  Diagnosis Date  . Abnormal thyroid function test 07/01/2013  . Arthritis   . Cancer ([place])   . Dermatitis 11/25/2013  . Dyslipidemia   . Glaucoma 11/02/2012  . HTN (hypertension) 11/02/2012  . 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 Hyperlipidemia   . Obesity     Past Surgical History:  Procedure Laterality Date  . BREAST SURGERY  10/2005   Left Mastectomy, reconstruction, reduction on right. Dr. Harlow Mares  . CRYOTHERAPY     GYN for CIN III  . DILATION AND CURETTAGE OF UTERUS    . EXCISION MORTON'S NEUROMA     right foot    There were no vitals filed for this visit.      Subjective Assessment - 09/11/16 0801    Subjective No complaint of pain.  I'm doing better with putting on my earrings.  I still have trouble putting on deodorant.     Pertinent History breast cancer: no [place], no UBE, no heat to Lt shoulder.  Rt humerus fracture: 05/16/16.  MD OK with strength progression as tolerated (08/21/16)   Currently in Pain? No/denies   Pain Score 0-No pain   Pain Location Shoulder   Pain Orientation Right   Pain Type Acute pain                         [facility] Adult PT Treatment/Exercise - 09/11/16 0001      Shoulder Exercises: Seated   ABduction Limitations scaption  from table 2  heights straight elbow 2x10   Other Seated Exercises bent arm lift from table 2x10   Other Seated Exercises table slides flexion 10x     Shoulder Exercises: Standing   External Rotation Strengthening;Right;20 reps;Theraband   Theraband Level (Shoulder External Rotation) Level 1 (Yellow)   Internal Rotation Strengthening;Right;20 reps;Theraband   Theraband Level (Shoulder Internal Rotation) Level 1 (Yellow)   Flexion Strengthening;Right;20 reps;Theraband   Theraband Level (Shoulder Flexion) Level 1 (Yellow)   Extension Strengthening;Right;20 reps;Theraband   Theraband Level (Shoulder Extension) Level 1 (Yellow)   Row Strengthening;Right;20 reps;Theraband   Theraband Level (Shoulder Row) Level 1 (Yellow)   Shoulder Elevation AAROM;Right   Shoulder Elevation Limitations on red ball on wall 10x   Other Standing Exercises UE Ranger on floor, on wall level 5 10x each- circles bil, scaption and flexion   Other Standing Exercises UE Ranger IR behind back 2x10     Shoulder Exercises: Pulleys   Flexion 3 minutes   ABduction 3 minutes     Moist Heat Therapy   Number Minutes Moist Heat 10 Minutes   Moist Heat Location Lumbar Spine  scapular area  PT Short Term Goals - 09/11/16 1310      PT SHORT TERM GOAL #1   Title be independent in initial HEP   Status Achieved     PT SHORT TERM GOAL #2   Title demonstrate Rt shoulder A/ROM flexion to 100 degrees to improve overhead reaching   Status Achieved     PT SHORT TERM GOAL #3   Title demonstrate 50 degrees P/ROM Rt shoulder ER to improve use of Rt UE   Status Achieved     PT SHORT TERM GOAL #4   Title report < or = to 3/10 Rt shoulder pain with movement   Status Achieved           PT Long Term Goals - 09/11/16 1310      PT LONG TERM GOAL #1   Title be independent in advanced HEP   Time 8   Period Weeks   Status On-going     PT LONG TERM GOAL #2   Title reduce FOTO to < or = to 36% limitation    Time 8   Period Weeks   Status On-going     PT LONG TERM GOAL #3   Title demonstrate Rt shoulder A/ROM flexion to 120 degrees to improve overhead use   Status Achieved     PT LONG TERM GOAL #4   Title improve strength and endurance to  report < or = to 90% use of Rt UE with ADLs and self-care   Time 8   Period Weeks   Status On-going     PT LONG TERM GOAL #5   Title demonstrate > or = to 4/5 Rt shoulder strength thoughout to improve use with home tasks and safe driving   Time 8   Period Weeks   Status On-going     PT LONG TERM GOAL #6   Title report  <or = to 2/10 Rt shoulder pain with use with home tasks and self-care   Status Achieved     PT LONG TERM GOAL #7   Title demonstrate Rt shoulder A/ROM IR to L3 to improve self care   Time 8   Period Weeks   Status On-going               Plan - 09/11/16 0932    Clinical Impression Statement The patient reports continued functional difficulties particularly with putting deodorant under her arm.  She states putting on her earrings is easier now.  Compensatory shoulder hike with elevating her arm.  She complains of left sided thoracic region pain with reaching up on wall.  Pain relieved with rubbing the area and heat.  Therapist closely monitoring response and modifying as needed.     PT Next Visit Plan continue with gentle stretching behind the back and PREs for shoulder strength. Recheck ROM next visit; avoid spinal rotation with exercise   Recommended Other Services MD 09/25/16      Patient will benefit from skilled therapeutic intervention in order to improve the following deficits and impairments:     Visit Diagnosis: Stiffness of right shoulder, not elsewhere classified  Muscle weakness (generalized)  Acute pain of right shoulder     Problem List Patient Active Problem List   Diagnosis Date Noted  . Medicare annual wellness visit, subsequent 09/26/2014  . Hypothyroidism 07/01/2013  . Glaucoma 11/02/2012  .  White coat hypertension 11/02/2012  . Vitamin D deficiency 11/02/2012  . Hyperlipidemia, mixed 07/24/2011  . Obesity (BMI 30-39.9) 07/24/2011  .  hx: breast cancer, IDC (Microinvasive) w DCIS, 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 - 07/10/2011   Ruben Im, PT 09/11/16 1:13 PM Phone: [phone] Fax: [phone]  [name] 09/11/2016, 1:11 PM  [facility] [address], [place], [place], [address] Phone: [phone]   Fax:  [phone]  Name: [name] MRN: [id number] Date of Birth: 22-Nov-1940

## 2016-09-14 ENCOUNTER — Ambulatory Visit: Payer: Medicare Other | Admitting: Physical Therapy

## 2016-09-14 DIAGNOSIS — M25611 Stiffness of right shoulder, not elsewhere classified: Secondary | ICD-10-CM | POA: Diagnosis not present

## 2016-09-14 DIAGNOSIS — M25511 Pain in right shoulder: Secondary | ICD-10-CM

## 2016-09-14 DIAGNOSIS — M6281 Muscle weakness (generalized): Secondary | ICD-10-CM

## 2016-09-14 NOTE — Therapy (Signed)
[facility] [address], [place], [place], [address] Phone: [phone]   Fax:  [phone]  Physical Therapy Treatment  Patient Details  Name: [name] MRN: [id number] Date of Birth: Jan 10, 1941 Referring Provider: Kathryne Hitch, MD  Encounter Date: 09/14/2016      PT End of Session - 09/14/16 0825    Visit Number 24   Number of Visits 29   Date for PT Re-Evaluation Nov 08, 2016   Authorization Type G-codes at 40   PT Start Time 0805   PT Stop Time 0843   PT Time Calculation (min) 38 min   Activity Tolerance Patient tolerated treatment well      Past Medical History:  Diagnosis Date  . Abnormal thyroid function test 07/01/2013  . Arthritis   . Cancer ([place])   . Dermatitis 11/25/2013  . Dyslipidemia   . Glaucoma 11/02/2012  . HTN (hypertension) 11/02/2012  . 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 Hyperlipidemia   . Obesity     Past Surgical History:  Procedure Laterality Date  . BREAST SURGERY  10/2005   Left Mastectomy, reconstruction, reduction on right. Dr. Harlow Mares  . CRYOTHERAPY     GYN for CIN III  . DILATION AND CURETTAGE OF UTERUS    . EXCISION MORTON'S NEUROMA     right foot    There were no vitals filed for this visit.      Subjective Assessment - 09/14/16 0806    Subjective Feeling Ok today.  That spot around my shoulder blade bothered me the rest of the day.  I think I twisted too fast.     Currently in Pain? Yes   Pain Score 2   soreness   Pain Location Shoulder   Pain Orientation Right   Pain Type Acute pain   Pain Onset More than a month ago   Pain Frequency Intermittent            [facility] PT Assessment - 09/14/16 0001      AROM   Right Shoulder Flexion 145 Degrees   Right Shoulder ABduction 140 Degrees   Right Shoulder Internal Rotation --  right buttock posterior   Right Shoulder External Rotation 70 Degrees     Strength    Right Shoulder Flexion 4/5   Right Shoulder ABduction 4-/5   Right Shoulder Internal Rotation 4/5   Right Shoulder External Rotation 3+/5                     [facility] Adult PT Treatment/Exercise - 09/14/16 0001      Shoulder Exercises: Supine   Other Supine Exercises UE Ranger Flexion 10x   Other Supine Exercises 1# flexion 12/6;00 9/3:00     Shoulder Exercises: Seated   Other Seated Exercises bent arm lift from table 2x10   1#     Shoulder Exercises: Sidelying   External Rotation Strengthening;Right;10 reps;Weights   External Rotation Weight (lbs) 1   Other Sidelying Exercises 1# weight clocks 12/6:00, 9:00/3:00 10x each     Shoulder Exercises: Standing   External Rotation Strengthening;Right;15 reps;Theraband   Theraband Level (Shoulder External Rotation) Level 2 (Red)   Internal Rotation Strengthening;Right;15 reps   Theraband Level (Shoulder Internal Rotation) Level 2 (Red)   Flexion Strengthening;Right;15 reps;Theraband   Theraband Level (Shoulder Flexion) Level 2 (Red)   Flexion Limitations red band diagonal extensions 15x D1/D2 patterns   Extension Strengthening;Right;15 reps;Theraband   Theraband  Level (Shoulder Extension) Level 2 (Red)   Row Strengthening;Right;15 reps;Theraband   Theraband Level (Shoulder Row) Level 2 (Red)   Other Standing Exercises UE Ranger on floor, on wall level 5 10x each- circles bil, scaption and flexion   Other Standing Exercises Railing slides for elevation 10x                  PT Short Term Goals - 09/14/16 1236      PT SHORT TERM GOAL #1   Title be independent in initial HEP   Status Achieved     PT SHORT TERM GOAL #2   Title demonstrate Rt shoulder A/ROM flexion to 100 degrees to improve overhead reaching   Status Achieved     PT SHORT TERM GOAL #3   Title demonstrate 50 degrees P/ROM Rt shoulder ER to improve use of Rt UE   Status Achieved     PT SHORT TERM GOAL #4   Title report < or = to 3/10 Rt  shoulder pain with movement   Status Achieved           PT Long Term Goals - 09/14/16 1236      PT LONG TERM GOAL #1   Title be independent in advanced HEP   Time 8   Period Weeks   Status On-going     PT LONG TERM GOAL #2   Title reduce FOTO to < or = to 36% limitation   Time 8   Period Weeks   Status On-going     PT LONG TERM GOAL #3   Title demonstrate Rt shoulder A/ROM flexion to 120 degrees to improve overhead use   Status Achieved     PT LONG TERM GOAL #4   Title improve strength and endurance to  report < or = to 90% use of Rt UE with ADLs and self-care   Time 8   Status On-going     PT LONG TERM GOAL #5   Title demonstrate > or = to 4/5 Rt shoulder strength thoughout to improve use with home tasks and safe driving   Time 8   Period Weeks   Status On-going     PT LONG TERM GOAL #6   Title report  <or = to 2/10 Rt shoulder pain with use with home tasks and self-care   Status Achieved     PT LONG TERM GOAL #7   Title demonstrate Rt shoulder A/ROM IR to L3 to improve self care   Time 8   Period Weeks   Status On-going               Plan - 09/14/16 [id number]    Clinical Impression Statement The patient has made significant improvements in shoulder ROM in flexion, abduction and external rotation.   Her horizontal adduction and internal rotation are still limited although patient reports improvement these directions at the end of today's treatment session.  Decreased glenohumeral and scapular muscle strength.  Therapist closely monitoring pain response.      PT Next Visit Plan continue red band over the door diagonal extensions;  continue PREs for shoulder;  internal rotation   Recommended Other Services MD 09/25/16      Patient will benefit from skilled therapeutic intervention in order to improve the following deficits and impairments:     Visit Diagnosis: Stiffness of right shoulder, not elsewhere classified  Muscle weakness (generalized)  Acute  pain of right shoulder     Problem List Patient Active  Problem List   Diagnosis Date Noted  . Medicare annual wellness visit, subsequent 09/26/2014  . Hypothyroidism 07/01/2013  . Glaucoma 11/02/2012  . White coat hypertension 11/02/2012  . Vitamin D deficiency 11/02/2012  . Hyperlipidemia, mixed 07/24/2011  . Obesity (BMI 30-39.9) 07/24/2011  . hx: breast cancer, IDC (Microinvasive) w DCIS, 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 - 07/10/2011   Ruben Im, PT 09/14/16 12:40 PM Phone: [phone] Fax: [phone]  [name] 09/14/2016, 12:39 PM  [facility] [address], [place], [place], [address] Phone: [phone]   Fax:  [phone]  Name: [name] MRN: [id number] Date of Birth: Jan 08, 1941

## 2016-09-18 ENCOUNTER — Encounter: Payer: Self-pay | Admitting: Physical Therapy

## 2016-09-18 ENCOUNTER — Ambulatory Visit: Payer: Medicare Other | Admitting: Physical Therapy

## 2016-09-18 DIAGNOSIS — M25511 Pain in right shoulder: Secondary | ICD-10-CM

## 2016-09-18 DIAGNOSIS — M6281 Muscle weakness (generalized): Secondary | ICD-10-CM

## 2016-09-18 DIAGNOSIS — M25611 Stiffness of right shoulder, not elsewhere classified: Secondary | ICD-10-CM

## 2016-09-18 NOTE — Therapy (Signed)
[facility] [address] [place], [place], [address] Phone: [phone]   Fax:  [phone]  Physical Therapy Treatment  Patient Details  Name: [name] MRN: [id number] Date of Birth: [birth year] Referring Provider: Kathryne Hitch, MD  Encounter Date: 09/18/2016      PT End of Session - 09/18/16 0803    Visit Number 25   Number of Visits 29   Date for PT Re-Evaluation 02-Nov-2016   Authorization Type G-codes at 37   PT Start Time 0801   PT Stop Time 0845   PT Time Calculation (min) 44 min   Activity Tolerance Patient tolerated treatment well   Behavior During Therapy [facility] for tasks assessed/performed      Past Medical History:  Diagnosis Date  . Abnormal thyroid function test 07/01/2013  . Arthritis   . Cancer ([place])   . Dermatitis 11/25/2013  . Dyslipidemia   . Glaucoma 11/02/2012  . HTN (hypertension) 11/02/2012  . 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 Hyperlipidemia   . Obesity     Past Surgical History:  Procedure Laterality Date  . BREAST SURGERY  10/2005   Left Mastectomy, reconstruction, reduction on right. Dr. Harlow Mares  . CRYOTHERAPY     GYN for CIN III  . DILATION AND CURETTAGE OF UTERUS    . EXCISION MORTON'S NEUROMA     right foot    There were no vitals filed for this visit.      Subjective Assessment - 09/18/16 0802    Subjective Shoulder feeling fine today.    Pertinent History breast cancer: no [place], no UBE, no heat to Lt shoulder.  Rt humerus fracture: 05/16/16.  MD OK with strength progression as tolerated (08/21/16)   Diagnostic tests x-ray: proximal humerus fracture   Patient Stated Goals improve use of Rt UE, imporve A/ROM and strength,   Currently in Pain? Yes   Pain Score 3    Pain Location Shoulder   Pain Orientation Right   Pain Descriptors / Indicators Sore   Pain Type Acute pain   Pain Onset More than a month ago   Pain  Frequency Intermittent                         [facility] Adult PT Treatment/Exercise - 09/18/16 0001      Shoulder Exercises: Supine   Other Supine Exercises UE Ranger Flexion 10x   Other Supine Exercises 1# flexion 12/6;00 9/3:00     Shoulder Exercises: Seated   ABduction Limitations scaption  from table 2 heights straight elbow 2x10   Other Seated Exercises bent arm lift from table 2x10   1#     Shoulder Exercises: Sidelying   External Rotation Strengthening;Right;10 reps;Weights   External Rotation Weight (lbs) 1   Other Sidelying Exercises 1# weight clocks 12/6:00, 9:00/3:00 10x each     Shoulder Exercises: Standing   External Rotation Strengthening;Right;15 reps;Theraband   Theraband Level (Shoulder External Rotation) Level 2 (Red)   Internal Rotation Strengthening;Right;15 reps   Theraband Level (Shoulder Internal Rotation) Level 2 (Red)   Flexion Strengthening;Right;15 reps;Theraband   Theraband Level (Shoulder Flexion) Level 2 (Red)   Flexion Limitations red band diagonal extensions 15x D1/D2 patterns   Extension Strengthening;Right;15 reps;Theraband   Theraband Level (Shoulder Extension) Level 2 (Red)   Row Strengthening;Right;15 reps;Theraband   Theraband Level (Shoulder Row) Level 2 (Red)   Other Standing Exercises UE  Ranger on floor, on wall level 5 10x each- circles bil, scaption and flexion     Shoulder Exercises: Stretch   Other Shoulder Stretches Behind the back stretch: with cane and towel   internal rotation     Manual Therapy   Manual Therapy Soft tissue mobilization;Joint mobilization   Joint Mobilization A/P mobs grade II   Soft tissue mobilization Pec minor anterior and middle deltoid                  PT Short Term Goals - 09/14/16 1236      PT SHORT TERM GOAL #1   Title be independent in initial HEP   Status Achieved     PT SHORT TERM GOAL #2   Title demonstrate Rt shoulder A/ROM flexion to 100 degrees to improve overhead  reaching   Status Achieved     PT SHORT TERM GOAL #3   Title demonstrate 50 degrees P/ROM Rt shoulder ER to improve use of Rt UE   Status Achieved     PT SHORT TERM GOAL #4   Title report < or = to 3/10 Rt shoulder pain with movement   Status Achieved           PT Long Term Goals - 09/18/16 0804      PT LONG TERM GOAL #2   Title reduce FOTO to < or = to 36% limitation   Baseline 44%   Time 8   Period Weeks   Status On-going     PT LONG TERM GOAL #4   Title improve strength and endurance to  report < or = to 90% use of Rt UE with ADLs and self-care   Time 8   Period Weeks   Status On-going     PT LONG TERM GOAL #5   Title demonstrate > or = to 4/5 Rt shoulder strength thoughout to improve use with home tasks and safe driving   Time 8   Period Weeks   Status On-going     PT LONG TERM GOAL #7   Title demonstrate Rt shoulder A/ROM IR to L3 to improve self care   Time 8   Period Weeks   Status On-going               Plan - 09/18/16 0820    Clinical Impression Statement Pt continues to progress with shoulder strength and ROM. Able to tolerate all exercises well. Will continue to progress with shoulder internal roation and horizontal adduction.    Rehab Potential Good   PT Frequency 2x / week   PT Duration 8 weeks   PT Treatment/Interventions ADLs/Self Care Home Management;Cryotherapy;Electrical Stimulation;Functional mobility training;Moist Heat;Therapeutic activities;Therapeutic exercise;Neuromuscular re-education;Patient/family education;Passive range of motion;Manual techniques;Dry needling;Taping;Vasopneumatic Device   PT Next Visit Plan continue red band over the door diagonal extensions;  continue PREs for shoulder;  internal rotation   Consulted and Agree with Plan of Care Patient      Patient will benefit from skilled therapeutic intervention in order to improve the following deficits and impairments:  Postural dysfunction, Decreased strength, Impaired  flexibility, Pain, Decreased activity tolerance, Decreased endurance, Impaired UE functional use, Decreased range of motion  Visit Diagnosis: Stiffness of right shoulder, not elsewhere classified  Muscle weakness (generalized)  Acute pain of right shoulder     Problem List Patient Active Problem List   Diagnosis Date Noted  . Medicare annual wellness visit, subsequent 09/26/2014  . Hypothyroidism 07/01/2013  . Glaucoma 11/02/2012  . White coat  hypertension 11/02/2012  . Vitamin D deficiency 11/02/2012  . Hyperlipidemia, mixed 07/24/2011  . Obesity (BMI 30-39.9) 07/24/2011  . hx: breast cancer, IDC (Microinvasive) w DCIS, 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 - 07/10/2011    Mikle Bosworth PTA 09/18/2016, 9:02 AM  [facility] [address], [place], [place], [address] Phone: [phone]   Fax:  [phone]  Name: SHNIYA ANCEL MRN: [id number] Date of Birth: November 17, 1940

## 2016-09-21 ENCOUNTER — Ambulatory Visit: Payer: Medicare Other | Admitting: Physical Therapy

## 2016-09-21 ENCOUNTER — Encounter: Payer: Self-pay | Admitting: Physical Therapy

## 2016-09-21 DIAGNOSIS — M25611 Stiffness of right shoulder, not elsewhere classified: Secondary | ICD-10-CM

## 2016-09-21 DIAGNOSIS — M6281 Muscle weakness (generalized): Secondary | ICD-10-CM

## 2016-09-21 DIAGNOSIS — M25511 Pain in right shoulder: Secondary | ICD-10-CM

## 2016-09-21 NOTE — Therapy (Signed)
[facility] [address] [place], [place], [address] Phone: [phone]   Fax:  [phone]  Physical Therapy Treatment  Patient Details  Name: [name] MRN: [id number] Date of Birth: 1941/04/03 Referring Provider: Kathryne Hitch, MD  Encounter Date: 09/21/2016      PT End of Session - 09/21/16 0809    Visit Number 26   Number of Visits 29   Date for PT Re-Evaluation October 27, 2016   Authorization Type G-codes at 30   PT Start Time 0806   PT Stop Time 0845   PT Time Calculation (min) 39 min   Activity Tolerance Patient tolerated treatment well   Behavior During Therapy [facility] for tasks assessed/performed      Past Medical History:  Diagnosis Date  . Abnormal thyroid function test 07/01/2013  . Arthritis   . Cancer ([place])   . Dermatitis 11/25/2013  . Dyslipidemia   . Glaucoma 11/02/2012  . HTN (hypertension) 11/02/2012  . 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 Hyperlipidemia   . Obesity     Past Surgical History:  Procedure Laterality Date  . BREAST SURGERY  10/2005   Left Mastectomy, reconstruction, reduction on right. Dr. Harlow Mares  . CRYOTHERAPY     GYN for CIN III  . DILATION AND CURETTAGE OF UTERUS    . EXCISION MORTON'S NEUROMA     right foot    There were no vitals filed for this visit.      Subjective Assessment - 09/21/16 0808    Subjective Feelin sore today. "I can tell I worked out the other day."   Pertinent History breast cancer: no [place], no UBE, no heat to Lt shoulder.  Rt humerus fracture: 05/16/16.  MD OK with strength progression as tolerated (08/21/16)   Diagnostic tests x-ray: proximal humerus fracture   Patient Stated Goals improve use of Rt UE, imporve A/ROM and strength,   Currently in Pain? Yes   Pain Score 4    Pain Location Shoulder   Pain Orientation Right   Pain Descriptors / Indicators Sore   Pain Type Acute pain   Pain Onset More  than a month ago   Pain Frequency Intermittent                         [facility] Adult PT Treatment/Exercise - 09/21/16 0001      Shoulder Exercises: Supine   Other Supine Exercises Cane flexion and adduction  for ROM   Other Supine Exercises 1# flexion 12/6;00 9/3:00     Shoulder Exercises: Sidelying   External Rotation Strengthening;Right;Weights  3x10   External Rotation Weight (lbs) 1   Other Sidelying Exercises --     Shoulder Exercises: Standing   External Rotation Strengthening;Right;Theraband  3x10   Theraband Level (Shoulder External Rotation) Level 2 (Red)   Internal Rotation Strengthening;Right  3x10   Theraband Level (Shoulder Internal Rotation) Level 2 (Red)   Flexion Strengthening;Right;Theraband  3x10   Theraband Level (Shoulder Flexion) Level 2 (Red)   Flexion Limitations red band diagonal extensions 3x10 D1 patterns   Extension Strengthening;Right;Theraband  3x10   Theraband Level (Shoulder Extension) Level 2 (Red)   Row Strengthening;Right;Theraband  3x10   Theraband Level (Shoulder Row) Level 2 (Red)   Other Standing Exercises UE Ranger on floor, on wall level 5 10x each- circles bil, scaption and flexion     Shoulder Exercises: Stretch   Other  Shoulder Stretches Behind the back stretch: with cane and towel   internal rotation                  PT Short Term Goals - 09/14/16 1236      PT SHORT TERM GOAL #1   Title be independent in initial HEP   Status Achieved     PT SHORT TERM GOAL #2   Title demonstrate Rt shoulder A/ROM flexion to 100 degrees to improve overhead reaching   Status Achieved     PT SHORT TERM GOAL #3   Title demonstrate 50 degrees P/ROM Rt shoulder ER to improve use of Rt UE   Status Achieved     PT SHORT TERM GOAL #4   Title report < or = to 3/10 Rt shoulder pain with movement   Status Achieved           PT Long Term Goals - 09/18/16 0804      PT LONG TERM GOAL #2   Title reduce FOTO to <  or = to 36% limitation   Baseline 44%   Time 8   Period Weeks   Status On-going     PT LONG TERM GOAL #4   Title improve strength and endurance to  report < or = to 90% use of Rt UE with ADLs and self-care   Time 8   Period Weeks   Status On-going     PT LONG TERM GOAL #5   Title demonstrate > or = to 4/5 Rt shoulder strength thoughout to improve use with home tasks and safe driving   Time 8   Period Weeks   Status On-going     PT LONG TERM GOAL #7   Title demonstrate Rt shoulder A/ROM IR to L3 to improve self care   Time 8   Period Weeks   Status On-going               Plan - 09/21/16 [id number]    Clinical Impression Statement Pt continues to have limitations with internal rotation and horizontal adduction to perform self care activities. Pt progressing slowly with strengthening but continues to have inadequate movement patterns with some exercises due to limited ROM.  Pt needing verbal cues to decrease upper trap activation with exercsies.    Rehab Potential Good   PT Frequency 2x / week   PT Duration 8 weeks   PT Treatment/Interventions ADLs/Self Care Home Management;Cryotherapy;Electrical Stimulation;Functional mobility training;Moist Heat;Therapeutic activities;Therapeutic exercise;Neuromuscular re-education;Patient/family education;Passive range of motion;Manual techniques;Dry needling;Taping;Vasopneumatic Device   PT Next Visit Plan Continues to progress shoulder internal rotation and horizontal adduction; Pt to see MD on 09/25/16   Consulted and Agree with Plan of Care Patient      Patient will benefit from skilled therapeutic intervention in order to improve the following deficits and impairments:  Postural dysfunction, Decreased strength, Impaired flexibility, Pain, Decreased activity tolerance, Decreased endurance, Impaired UE functional use, Decreased range of motion  Visit Diagnosis: Stiffness of right shoulder, not elsewhere classified  Muscle weakness  (generalized)  Acute pain of right shoulder     Problem List Patient Active Problem List   Diagnosis Date Noted  . Medicare annual wellness visit, subsequent 09/26/2014  . Hypothyroidism 07/01/2013  . Glaucoma 11/02/2012  . White coat hypertension 11/02/2012  . Vitamin D deficiency 11/02/2012  . Hyperlipidemia, mixed 07/24/2011  . Obesity (BMI 30-39.9) 07/24/2011  . hx: breast cancer, IDC (Microinvasive) w DCIS, 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 - 07/10/2011  Mikle Bosworth PTA 09/21/2016, 8:44 AM  [facility] [address], [place], [place], [address] Phone: [phone]   Fax:  [phone]  Name: [name] MRN: [id number] Date of Birth: 10/05/1940

## 2016-09-24 ENCOUNTER — Ambulatory Visit: Payer: Medicare Other

## 2016-09-24 ENCOUNTER — Telehealth: Payer: Self-pay | Admitting: Internal Medicine

## 2016-09-24 DIAGNOSIS — M25611 Stiffness of right shoulder, not elsewhere classified: Secondary | ICD-10-CM

## 2016-09-24 DIAGNOSIS — M6281 Muscle weakness (generalized): Secondary | ICD-10-CM

## 2016-09-24 DIAGNOSIS — M25511 Pain in right shoulder: Secondary | ICD-10-CM

## 2016-09-24 NOTE — Therapy (Signed)
[facility] [address] [place], [place], [address] Phone: [phone]   Fax:  [phone]  Physical Therapy Treatment  Patient Details  Name: [name] MRN: [id number] Date of Birth: 08/09/1941 Referring Provider: Kathryne Hitch, MD  Encounter Date: 09/24/2016      PT End of Session - 09/24/16 0842    Visit Number 27   Number of Visits 29   Date for PT Re-Evaluation Nov 10, 2016   Authorization Type G-codes at 45   PT Start Time 0800   PT Stop Time 0845   PT Time Calculation (min) 45 min   Activity Tolerance Patient tolerated treatment well   Behavior During Therapy [facility] for tasks assessed/performed      Past Medical History:  Diagnosis Date  . Abnormal thyroid function test 07/01/2013  . Arthritis   . Cancer ([place])   . Dermatitis 11/25/2013  . Dyslipidemia   . Glaucoma 11/02/2012  . HTN (hypertension) 11/02/2012  . 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 Hyperlipidemia   . Obesity     Past Surgical History:  Procedure Laterality Date  . BREAST SURGERY  10/2005   Left Mastectomy, reconstruction, reduction on right. Dr. Harlow Mares  . CRYOTHERAPY     GYN for CIN III  . DILATION AND CURETTAGE OF UTERUS    . EXCISION MORTON'S NEUROMA     right foot    There were no vitals filed for this visit.      Subjective Assessment - 09/24/16 0804    Subjective I am going to the MD on Wednesday.     Diagnostic tests x-ray: proximal humerus fracture   Patient Stated Goals improve use of Rt UE, imporve A/ROM and strength,   Currently in Pain? Yes   Pain Score 2    Pain Location Shoulder   Pain Orientation Right   Pain Descriptors / Indicators Sore   Pain Type Acute pain   Pain Onset More than a month ago   Pain Frequency Intermittent   Aggravating Factors  soreness that is intermittent   Pain Relieving Factors rest, stretching            [facility] PT Assessment -  09/24/16 0001      Assessment   Medical Diagnosis s/p Rt proximal humerus fracture     Prior Function   Level of Independence Independent     Observation/Other Assessments   Focus on Therapeutic Outcomes (FOTO)  31% limitation     AROM   Right Shoulder Flexion 136 Degrees   Right Shoulder ABduction 123 Degrees   Right Shoulder Internal Rotation --  to Rt buttock     Strength   Right Shoulder Flexion 4/5   Right Shoulder ABduction 4/5   Right Shoulder Internal Rotation 4/5   Right Shoulder External Rotation 4-/5                     [facility] Adult PT Treatment/Exercise - 09/24/16 0001      Shoulder Exercises: Supine   Other Supine Exercises Cane flexion and adduction  for ROM   Other Supine Exercises 1# flexion 12/6;00 9/3:00     Shoulder Exercises: Sidelying   External Rotation Strengthening;Right;Weights  3x10   External Rotation Weight (lbs) 1     Shoulder Exercises: Standing   External Rotation Strengthening;Right;Theraband  3x10   Theraband Level (Shoulder External Rotation) Level 2 (Red)   Internal Rotation Strengthening;Right  3x10  Theraband Level (Shoulder Internal Rotation) Level 2 (Red)   Flexion Strengthening;Right;Theraband  3x10   Theraband Level (Shoulder Flexion) Level 2 (Red)   Extension Strengthening;Right;Theraband  3x10   Theraband Level (Shoulder Extension) Level 2 (Red)   Other Standing Exercises UE Ranger on floor, on wall level 5 10x each- circles bil, scaption and flexion     Shoulder Exercises: Stretch   Other Shoulder Stretches Behind the back stretch: with cane and towel   internal rotation                  PT Short Term Goals - 09/14/16 1236      PT SHORT TERM GOAL #1   Title be independent in initial HEP   Status Achieved     PT SHORT TERM GOAL #2   Title demonstrate Rt shoulder A/ROM flexion to 100 degrees to improve overhead reaching   Status Achieved     PT SHORT TERM GOAL #3   Title demonstrate 50  degrees P/ROM Rt shoulder ER to improve use of Rt UE   Status Achieved     PT SHORT TERM GOAL #4   Title report < or = to 3/10 Rt shoulder pain with movement   Status Achieved           PT Long Term Goals - 09/24/16 0806      PT LONG TERM GOAL #1   Title be independent in advanced HEP   Time 8   Period Weeks   Status On-going     PT LONG TERM GOAL #2   Title reduce FOTO to < or = to 36% limitation   Baseline 31% 09/24/16   Status Achieved     PT LONG TERM GOAL #3   Title demonstrate Rt shoulder A/ROM flexion to 120 degrees to improve overhead use     PT LONG TERM GOAL #4   Title improve strength and endurance to  report < or = to 90% use of Rt UE with ADLs and self-care   Status Achieved     PT LONG TERM GOAL #5   Title demonstrate > or = to 4/5 Rt shoulder strength thoughout to improve use with home tasks and safe driving   Time 8   Period Weeks   Status On-going     PT LONG TERM GOAL #6   Title report  <or = to 2/10 Rt shoulder pain with use with home tasks and self-care   Status Achieved     PT LONG TERM GOAL #7   Title demonstrate Rt shoulder A/ROM IR to L3 to improve self care   Time 8   Period Weeks   Status On-going               Plan - 09/24/16 [id number]    Clinical Impression Statement Pt reports 90% use of the Rt UE with home tasks and is limited with lifting heavy objects and reaching across body to wash under her Lt arm.  Pt remains weak in the Rt shoulder and has limited A/ROM especially with IR.  FOTO is improved to 31% (52% at evaluation).  Pt will continue to benefit from skilled PT for strength and endurance progression to improve endurance and lifting tasks and A/ROM progression to improve ease of self-care.   Rehab Potential Good   PT Frequency 2x / week   PT Duration 8 weeks   PT Treatment/Interventions ADLs/Self Care Home Management;Cryotherapy;Electrical Stimulation;Functional mobility training;Moist Heat;Therapeutic activities;Therapeutic  exercise;Neuromuscular re-education;Patient/family education;Passive range  of motion;Manual techniques;Dry needling;Taping;Vasopneumatic Device   PT Next Visit Plan See what MD says.  Continue Rt shoulder IR horizontal abduction and strength progression.  Pt will need G-codes in 2 visits (scheduled with PTA that day).   Consulted and Agree with Plan of Care Patient      Patient will benefit from skilled therapeutic intervention in order to improve the following deficits and impairments:  Postural dysfunction, Decreased strength, Impaired flexibility, Pain, Decreased activity tolerance, Decreased endurance, Impaired UE functional use, Decreased range of motion  Visit Diagnosis: Stiffness of right shoulder, not elsewhere classified  Muscle weakness (generalized)  Acute pain of right shoulder     Problem List Patient Active Problem List   Diagnosis Date Noted  . Medicare annual wellness visit, subsequent 09/26/2014  . Hypothyroidism 07/01/2013  . Glaucoma 11/02/2012  . White coat hypertension 11/02/2012  . Vitamin D deficiency 11/02/2012  . Hyperlipidemia, mixed 07/24/2011  . Obesity (BMI 30-39.9) 07/24/2011  . hx: breast cancer, IDC (Microinvasive) w DCIS, 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 - 07/10/2011    Sigurd Sos, PT 09/24/16 8:48 AM  [facility] [address] [place], [place], [address] Phone: [phone]   Fax:  [phone]  Name: [name] MRN: [id number] Date of Birth: 1941-05-05

## 2016-09-24 NOTE — Telephone Encounter (Signed)
Spoke with patient regarding options to lower her cholesterol - since she does not have ASCVD or FH, PCSK9i approval would be very difficult. However, pt is likely to qualify for one of our lipid trials.  She has a history of high cholesterol with most recent LDL of 219. She's intolerant to pravastatin 20mg  daily, Livalo 1mg  and 2mg  daily, Vytorin 10-20mg  1/2 tab and 1 tab daily, and Crestor 5mg  daily (myalgias with each). Although she does not have clinical ASCVD, both the CLEAR and Regeneron trials are enrolling high risk primary prevention patients. Pt's 10 year risk using SCORE tool by ESC yields 8% risk given systolic BP of [id number] at last OV and total cholesterol > 300. This should help qualify pt for either CLEAR or Regeneron trials. Pt is very interested in trials - will forward her information to our research group to further screen pt. Will also route to Dr Yong Channel PCP as an Juluis Rainier that we are pursuing clinical trials for pt's cholesterol.

## 2016-09-24 NOTE — Telephone Encounter (Signed)
Follow Up    She was in and seen Dr Harrington Challenger 06/14/16 for check up and lipid testing, never heard back from the results and Dr Yong Channel wants to know if she is going to be accepted for trial clinic for cholesterol ??

## 2016-09-24 NOTE — Telephone Encounter (Signed)
Pt seen by Dr. Harrington Challenger once, 06/14/16. Notes from that OV:  ASSESSMENT AND PLAN:  1  HL  Reviewed with Sundra Aland PharmD  Pt should be a candidate for Repatha  Will follow up on coverage  Pt is willing to try  2.  HTN  BP is up  Encouraged her to keep log at home and brinhypg cuff in for calibration    F/U based on drug approval       Will forward to [place] D for review/recommendations and then will call patient back.

## 2016-09-28 ENCOUNTER — Encounter: Payer: Medicare Other | Admitting: Physical Therapy

## 2016-09-28 ENCOUNTER — Ambulatory Visit: Payer: Medicare Other

## 2016-10-02 ENCOUNTER — Telehealth: Payer: Self-pay | Admitting: Family Medicine

## 2016-10-02 ENCOUNTER — Ambulatory Visit (INDEPENDENT_AMBULATORY_CARE_PROVIDER_SITE_OTHER): Payer: Medicare Other

## 2016-10-02 ENCOUNTER — Ambulatory Visit: Payer: Medicare Other | Admitting: Physical Therapy

## 2016-10-02 ENCOUNTER — Encounter: Payer: Self-pay | Admitting: Physical Therapy

## 2016-10-02 ENCOUNTER — Encounter: Payer: Self-pay | Admitting: Family Medicine

## 2016-10-02 DIAGNOSIS — Z23 Encounter for immunization: Secondary | ICD-10-CM

## 2016-10-02 DIAGNOSIS — M25611 Stiffness of right shoulder, not elsewhere classified: Secondary | ICD-10-CM

## 2016-10-02 DIAGNOSIS — M6281 Muscle weakness (generalized): Secondary | ICD-10-CM

## 2016-10-02 DIAGNOSIS — M25511 Pain in right shoulder: Secondary | ICD-10-CM

## 2016-10-02 NOTE — Therapy (Signed)
[facility] [address] [place], [place], [address] Phone: [phone]   Fax:  [phone]  Physical Therapy Treatment  Patient Details  Name: [name] MRN: [id number] Date of Birth: 09/28/1940 Referring Provider: Kathryne Hitch, MD  Encounter Date: 10/02/2016      PT End of Session - 10/02/16 0810    Visit Number 28   Number of Visits 29   Date for PT Re-Evaluation November 02, 2016   Authorization Type G-codes at 31   PT Start Time 0805   PT Stop Time 0843   PT Time Calculation (min) 38 min   Activity Tolerance Patient tolerated treatment well   Behavior During Therapy [facility] for tasks assessed/performed      Past Medical History:  Diagnosis Date  . Abnormal thyroid function test 07/01/2013  . Arthritis   . Cancer ([place])   . Dermatitis 11/25/2013  . Dyslipidemia   . Glaucoma 11/02/2012  . HTN (hypertension) 11/02/2012  . 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 Hyperlipidemia   . Obesity     Past Surgical History:  Procedure Laterality Date  . BREAST SURGERY  10/2005   Left Mastectomy, reconstruction, reduction on right. Dr. Harlow Mares  . CRYOTHERAPY     GYN for CIN III  . DILATION AND CURETTAGE OF UTERUS    . EXCISION MORTON'S NEUROMA     right foot    There were no vitals filed for this visit.      Subjective Assessment - 10/02/16 0807    Subjective Pt saw MD last Tuesday. MD wants pt to continue therapy and return to MD in  4 weeks for follow up. Pt reports MD wants her passive range better.    Pertinent History breast cancer: no [place], no UBE, no heat to Lt shoulder.  Rt humerus fracture: 05/16/16.  MD OK with strength progression as tolerated (08/21/16)   Diagnostic tests x-ray: proximal humerus fracture   Patient Stated Goals improve use of Rt UE, imporve A/ROM and strength,   Currently in Pain? Yes   Pain Score 3    Pain Location Shoulder   Pain Orientation  Right   Pain Descriptors / Indicators Sore   Pain Type Acute pain   Pain Onset More than a month ago   Pain Frequency Intermittent                         [facility] Adult PT Treatment/Exercise - 10/02/16 0001      Shoulder Exercises: Standing   External Rotation Strengthening;Right;Theraband  3x10   Theraband Level (Shoulder External Rotation) Level 2 (Red)   Internal Rotation Strengthening;Right  3x10   Theraband Level (Shoulder Internal Rotation) Level 3 (Green)   Extension Strengthening;Right;Theraband  3x10   Theraband Level (Shoulder Extension) Level 3 (Green)   Row Strengthening;Right;Theraband  3x10   Theraband Level (Shoulder Row) Level 3 (Green)   Other Standing Exercises UE ranger on walll  Flexion and horizontal abduction/ abdduction     Shoulder Exercises: Stretch   Other Shoulder Stretches Behind the back stretch: with cane and towel   internal rotation     Manual Therapy   Manual Therapy Passive ROM   Passive ROM Shoulder in all planes                  PT Short Term Goals - 09/14/16 1236      PT SHORT TERM GOAL #  1   Title be independent in initial HEP   Status Achieved     PT SHORT TERM GOAL #2   Title demonstrate Rt shoulder A/ROM flexion to 100 degrees to improve overhead reaching   Status Achieved     PT SHORT TERM GOAL #3   Title demonstrate 50 degrees P/ROM Rt shoulder ER to improve use of Rt UE   Status Achieved     PT SHORT TERM GOAL #4   Title report < or = to 3/10 Rt shoulder pain with movement   Status Achieved           PT Long Term Goals - 10/02/16 [id number]      PT LONG TERM GOAL #5   Title demonstrate > or = to 4/5 Rt shoulder strength thoughout to improve use with home tasks and safe driving   Time 8   Period Weeks   Status On-going     PT LONG TERM GOAL #7   Title demonstrate Rt shoulder A/ROM IR to L3 to improve self care   Time 8   Period Weeks   Status On-going               Plan -  10/02/16 [id number]    Clinical Impression Statement Pt continues to have limited ROM which is affecting form with strengthening exercises. Able to tolerate all exercises well. Very limited internal rotation and abduction with PROM. Pt will conitnue to benefit from skilled therapy for progression of shouler ROM and functional strength.    Rehab Potential Good   PT Frequency 2x / week   PT Duration 8 weeks   PT Treatment/Interventions ADLs/Self Care Home Management;Cryotherapy;Electrical Stimulation;Functional mobility training;Moist Heat;Therapeutic activities;Therapeutic exercise;Neuromuscular re-education;Patient/family education;Passive range of motion;Manual techniques;Dry needling;Taping;Vasopneumatic Device   PT Next Visit Plan G codes this visit, continue ROM progression horizontal adduction   Consulted and Agree with Plan of Care Patient      Patient will benefit from skilled therapeutic intervention in order to improve the following deficits and impairments:  Postural dysfunction, Decreased strength, Impaired flexibility, Pain, Decreased activity tolerance, Decreased endurance, Impaired UE functional use, Decreased range of motion  Visit Diagnosis: Stiffness of right shoulder, not elsewhere classified  Muscle weakness (generalized)  Acute pain of right shoulder     Problem List Patient Active Problem List   Diagnosis Date Noted  . Medicare annual wellness visit, subsequent 09/26/2014  . Hypothyroidism 07/01/2013  . Glaucoma 11/02/2012  . White coat hypertension 11/02/2012  . Vitamin D deficiency 11/02/2012  . Hyperlipidemia, mixed 07/24/2011  . Obesity (BMI 30-39.9) 07/24/2011  . hx: breast cancer, IDC (Microinvasive) w DCIS, 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 - 07/10/2011    [name] PTA 10/02/2016, 8:46 AM   [facility] [address] [place], [place], [address] Phone: [phone]   Fax:  [phone]  Name: [name] MRN: [id number] Date of Birth: 05-18-1941

## 2016-10-02 NOTE — Progress Notes (Signed)
Patient received her Pneumococcal 23 vaccine on 01/23/18at 9.45am. Given by Rosealee Albee CMA

## 2016-10-02 NOTE — Telephone Encounter (Signed)
Average when reviewed for 28 readings 147/high 80s.   Technically per [facility] this is ok but I would prefer her to be closer to 130s if able to tolerate. My suggestion would be to try a low dose of amlodipine such as 2.5mg  to see if we can get a reduction at least into lower 140s on average. Would she be willing to try this? Can do #90 with 3 refills and schedule 1 month blood pressure recheck and have her see me at that time (bring cuff with her)

## 2016-10-03 NOTE — Telephone Encounter (Signed)
Called and left a message asking for a return phone call

## 2016-10-04 ENCOUNTER — Other Ambulatory Visit: Payer: Self-pay

## 2016-10-04 MED ORDER — AMLODIPINE BESYLATE 2.5 MG PO TABS: 2.5000 mg | ORAL_TABLET | Freq: Every day | ORAL | 3 refills | Status: DC

## 2016-10-04 NOTE — Telephone Encounter (Signed)
Spoke with patient who verbalized understanding of results. I sent in the prescription to her pharmacy as requested. I also scheduled her for a follow up in 1 month. Verbalized understanding about bringing her BP cuff from home.

## 2016-10-05 ENCOUNTER — Ambulatory Visit: Payer: Medicare Other | Admitting: Physical Therapy

## 2016-10-05 DIAGNOSIS — M25611 Stiffness of right shoulder, not elsewhere classified: Secondary | ICD-10-CM | POA: Diagnosis not present

## 2016-10-05 DIAGNOSIS — M6281 Muscle weakness (generalized): Secondary | ICD-10-CM

## 2016-10-05 DIAGNOSIS — M25511 Pain in right shoulder: Secondary | ICD-10-CM

## 2016-10-05 NOTE — Therapy (Signed)
[facility] [address] [place], [place], [address] Phone: [phone]   Fax:  [phone]  Physical Therapy Treatment  Patient Details  Name: [name] MRN: [id number] Date of Birth: 1941-02-02 Referring Provider: Kathryne Hitch, MD  Encounter Date: 10/05/2016      PT End of Session - 10/05/16 0836    Visit Number 29   Number of Visits 39   Date for PT Re-Evaluation 11-13-2016   Authorization Type G-codes at 29   PT Start Time 0800   PT Stop Time 0842   PT Time Calculation (min) 42 min   Activity Tolerance Patient tolerated treatment well      Past Medical History:  Diagnosis Date  . Abnormal thyroid function test 07/01/2013  . Arthritis   . Cancer ([place])   . Dermatitis 11/25/2013  . Dyslipidemia   . Glaucoma 11/02/2012  . HTN (hypertension) 11/02/2012  . 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 Hyperlipidemia   . Obesity     Past Surgical History:  Procedure Laterality Date  . BREAST SURGERY  10/2005   Left Mastectomy, reconstruction, reduction on right. Dr. Harlow Mares  . CRYOTHERAPY     GYN for CIN III  . DILATION AND CURETTAGE OF UTERUS    . EXCISION MORTON'S NEUROMA     right foot    There were no vitals filed for this visit.          [facility] PT Assessment - 10/05/16 0001      Observation/Other Assessments   Focus on Therapeutic Outcomes (FOTO)  36% limitation                     [facility] Adult PT Treatment/Exercise - 10/05/16 0001      Shoulder Exercises: Supine   Other Supine Exercises 1# 12/6:00 9:00/3 10x each  on incline wedge     Shoulder Exercises: Standing   External Rotation Strengthening;Right;20 reps;Theraband   Theraband Level (Shoulder External Rotation) Level 3 (Green)   Internal Rotation Strengthening;Right  3x10   Theraband Level (Shoulder Internal Rotation) Level 3 (Green)   Flexion Strengthening;Right;Theraband   3x10   Theraband Level (Shoulder Flexion) Level 2 (Red)   Extension Strengthening;Right;Theraband  3x10   Theraband Level (Shoulder Extension) Level 2 (Red)   Extension Limitations red band diagonal extensions 20x both directions.   Shoulder Elevation Limitations 2# weight on wall circles 2x15   Other Standing Exercises UE Ranger L0, L5, L10 10x each   Other Standing Exercises UE Ranger on wall L10 horizontal abduction/adduction                  PT Short Term Goals - 10/05/16 0851      PT SHORT TERM GOAL #1   Title be independent in initial HEP   Status Achieved     PT SHORT TERM GOAL #2   Title demonstrate Rt shoulder A/ROM flexion to 100 degrees to improve overhead reaching   Status Achieved     PT SHORT TERM GOAL #3   Title demonstrate 50 degrees P/ROM Rt shoulder ER to improve use of Rt UE   Status Achieved     PT SHORT TERM GOAL #4   Title report < or = to 3/10 Rt shoulder pain with movement   Status Achieved           PT Long Term Goals - 10/05/16 [id number]  PT LONG TERM GOAL #1   Title be independent in advanced HEP   Time 8   Period Weeks   Status On-going     PT LONG TERM GOAL #2   Title reduce FOTO to < or = to 36% limitation   Status Achieved     PT LONG TERM GOAL #3   Title demonstrate Rt shoulder A/ROM flexion to 120 degrees to improve overhead use   Status Achieved     PT LONG TERM GOAL #4   Title improve strength and endurance to  report < or = to 90% use of Rt UE with ADLs and self-care   Status Achieved     PT LONG TERM GOAL #5   Title demonstrate > or = to 4/5 Rt shoulder strength thoughout to improve use with home tasks and safe driving   Time 8   Period Weeks   Status On-going     PT LONG TERM GOAL #6   Title report  <or = to 2/10 Rt shoulder pain with use with home tasks and self-care   Status Achieved     PT LONG TERM GOAL #7   Title demonstrate Rt shoulder A/ROM IR to L3 to improve self care   Time 8   Period Weeks    Status On-going               Plan - 15-Oct-2016 [id number]    Clinical Impression Statement The patient is progressing with right shoulder pain and function per FOTO functional outcome score.  Her ROM and strength as well improving especially at shoulder level height to nose level heights.  The doctor recommended continued therapy to progress active ROM.  Will continue with PT and progress to eye level and above head level ROM and strengthening.   Therapist closely monitoring response with all.  Patient reports mild soreness and muscular fatigue post session but she denies the need for modalities.      PT Next Visit Plan Continue 2x/week for progressive ROM to eye level reaching, horizontal adduction and abduction;  recheck shoulder ROM, MMT      Patient will benefit from skilled therapeutic intervention in order to improve the following deficits and impairments:     Visit Diagnosis: Stiffness of right shoulder, not elsewhere classified  Muscle weakness (generalized)  Acute pain of right shoulder       G-Codes - 2016/10/15 [id number]    Functional Assessment Tool Used FOTO; clinical impression   Functional Limitation Other PT primary   Other PT Primary Current Status [id number]) At least 40 percent but less than 60 percent impaired, limited or restricted   Other PT Primary Goal Status [id number]) At least 20 percent but less than 40 percent impaired, limited or restricted      Problem List Patient Active Problem List   Diagnosis Date Noted  . Medicare annual wellness visit, subsequent 09/26/2014  . Hypothyroidism 07/01/2013  . Glaucoma 11/02/2012  . Essential hypertension 11/02/2012  . Vitamin D deficiency 11/02/2012  . Hyperlipidemia, mixed 07/24/2011  . Obesity (BMI 30-39.9) 07/24/2011  . hx: breast cancer, IDC (Microinvasive) w DCIS, 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 - 07/10/2011   Ruben Im, PT 10/15/2016 8:54 AM Phone: [phone] Fax: [phone]  [name] 2016-10-15, 8:53  AM  [facility] [address], [place], [place], [address] Phone: [phone]   Fax:  [phone]  Name: [name] MRN: [id number] Date of Birth: [birth year]

## 2016-10-09 ENCOUNTER — Ambulatory Visit: Payer: Medicare Other

## 2016-10-09 DIAGNOSIS — M25611 Stiffness of right shoulder, not elsewhere classified: Secondary | ICD-10-CM

## 2016-10-09 DIAGNOSIS — M6281 Muscle weakness (generalized): Secondary | ICD-10-CM

## 2016-10-09 DIAGNOSIS — M25511 Pain in right shoulder: Secondary | ICD-10-CM

## 2016-10-09 NOTE — Therapy (Signed)
[facility] [address], [place], [place], [address] Phone: [phone]   Fax:  [phone]  Physical Therapy Treatment  Patient Details  Name: [name] MRN: [id number] Date of Birth: 08-16-1941 Referring Provider: Kathryne Hitch, MD  Encounter Date: 10/09/2016      PT End of Session - 10/09/16 0827    Visit Number 30   Number of Visits 39   Date for PT Re-Evaluation 10-25-2016   Authorization Type G-codes at 77   PT Start Time 0801   PT Stop Time 0840   PT Time Calculation (min) 39 min   Activity Tolerance Patient tolerated treatment well   Behavior During Therapy [facility] for tasks assessed/performed      Past Medical History:  Diagnosis Date  . Abnormal thyroid function test 07/01/2013  . Arthritis   . Cancer ([place])   . Dermatitis 11/25/2013  . Dyslipidemia   . Glaucoma 11/02/2012  . HTN (hypertension) 11/02/2012  . 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 Hyperlipidemia   . Obesity     Past Surgical History:  Procedure Laterality Date  . BREAST SURGERY  10/2005   Left Mastectomy, reconstruction, reduction on right. Dr. Harlow Mares  . CRYOTHERAPY     GYN for CIN III  . DILATION AND CURETTAGE OF UTERUS    . EXCISION MORTON'S NEUROMA     right foot    There were no vitals filed for this visit.      Subjective Assessment - 10/09/16 0810    Subjective MD wants to see more motion.     Pertinent History breast cancer: no [place], no UBE, no heat to Lt shoulder.  Rt humerus fracture: 05/16/16.  MD OK with strength progression as tolerated (08/21/16)   Currently in Pain? Yes   Pain Score 3    Pain Location Shoulder   Pain Orientation Right   Pain Descriptors / Indicators Sore   Pain Type Acute pain   Pain Onset More than a month ago   Pain Frequency Intermittent   Aggravating Factors  soreness with use   Pain Relieving Factors rest, stretching                          [facility] Adult PT Treatment/Exercise - 10/09/16 0001      Shoulder Exercises: Standing   External Rotation Strengthening;Right;20 reps;Theraband   Theraband Level (Shoulder External Rotation) Level 3 (Green)   Internal Rotation Strengthening;Right  3x10   Theraband Level (Shoulder Internal Rotation) Level 3 (Green)   Flexion Strengthening;Right;Theraband  3x10   Theraband Level (Shoulder Flexion) Level 2 (Red)   Extension Strengthening;Right;Theraband  3x10   Theraband Level (Shoulder Extension) Level 2 (Red)   Shoulder Elevation Limitations finger ladder: flexion x 10 with prolonged stretch   Other Standing Exercises UE Ranger L0, L5, L10 10x each   Other Standing Exercises UE Ranger on wall L10 horizontal abduction/adduction     Manual Therapy   Manual Therapy Passive ROM   Passive ROM flexion, IR and ER to pt tolerance                  PT Short Term Goals - 10/05/16 0851      PT SHORT TERM GOAL #1   Title be independent in initial HEP   Status Achieved     PT SHORT TERM GOAL #2   Title demonstrate Rt shoulder A/ROM flexion  to 100 degrees to improve overhead reaching   Status Achieved     PT SHORT TERM GOAL #3   Title demonstrate 50 degrees P/ROM Rt shoulder ER to improve use of Rt UE   Status Achieved     PT SHORT TERM GOAL #4   Title report < or = to 3/10 Rt shoulder pain with movement   Status Achieved           PT Long Term Goals - 10/05/16 0851      PT LONG TERM GOAL #1   Title be independent in advanced HEP   Time 8   Period Weeks   Status On-going     PT LONG TERM GOAL #2   Title reduce FOTO to < or = to 36% limitation   Status Achieved     PT LONG TERM GOAL #3   Title demonstrate Rt shoulder A/ROM flexion to 120 degrees to improve overhead use   Status Achieved     PT LONG TERM GOAL #4   Title improve strength and endurance to  report < or = to 90% use of Rt UE with ADLs and self-care   Status  Achieved     PT LONG TERM GOAL #5   Title demonstrate > or = to 4/5 Rt shoulder strength thoughout to improve use with home tasks and safe driving   Time 8   Period Weeks   Status On-going     PT LONG TERM GOAL #6   Title report  <or = to 2/10 Rt shoulder pain with use with home tasks and self-care   Status Achieved     PT LONG TERM GOAL #7   Title demonstrate Rt shoulder A/ROM IR to L3 to improve self care   Time 8   Period Weeks   Status On-going               Plan - 10/09/16 [id number]    Clinical Impression Statement Pt is making steady progress with Rt shoulder A/ROM and functional use/strength.  PT provided tactile and verbal cues to hold stretches longer with PT exercises today.  Renewal will be needed next session.  Pt will continue to benefit from skilled PT for Rt shoulder strength, endurance and A/ROM, P/ROM.     Rehab Potential Good   PT Frequency 2x / week   PT Duration 8 weeks   PT Treatment/Interventions ADLs/Self Care Home Management;Cryotherapy;Electrical Stimulation;Functional mobility training;Moist Heat;Therapeutic activities;Therapeutic exercise;Neuromuscular re-education;Patient/family education;Passive range of motion;Manual techniques;Dry needling;Taping;Vasopneumatic Device   PT Next Visit Plan Continue 2x/week for progressive ROM to eye level reaching, horizontal adduction and abduction;  Renewal needed next session   Consulted and Agree with Plan of Care Patient      Patient will benefit from skilled therapeutic intervention in order to improve the following deficits and impairments:  Postural dysfunction, Decreased strength, Impaired flexibility, Pain, Decreased activity tolerance, Decreased endurance, Impaired UE functional use, Decreased range of motion  Visit Diagnosis: Stiffness of right shoulder, not elsewhere classified  Muscle weakness (generalized)  Acute pain of right shoulder     Problem List Patient Active Problem List   Diagnosis  Date Noted  . Medicare annual wellness visit, subsequent 09/26/2014  . Hypothyroidism 07/01/2013  . Glaucoma 11/02/2012  . Essential hypertension 11/02/2012  . Vitamin D deficiency 11/02/2012  . Hyperlipidemia, mixed 07/24/2011  . Obesity (BMI 30-39.9) 07/24/2011  . hx: breast cancer, IDC (Microinvasive) w DCIS, 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 - 07/10/2011    Claiborne Billings  Garnetta Buddy, PT 10/09/16 8:42 AM  [facility] [address], [place], [place], [address] Phone: [phone]   Fax:  [phone]  Name: [name] MRN: [id number] Date of Birth: 10-05-1940

## 2016-10-12 ENCOUNTER — Ambulatory Visit: Payer: Medicare Other | Attending: Orthopedic Surgery | Admitting: Physical Therapy

## 2016-10-12 DIAGNOSIS — M25511 Pain in right shoulder: Secondary | ICD-10-CM | POA: Insufficient documentation

## 2016-10-12 DIAGNOSIS — M25611 Stiffness of right shoulder, not elsewhere classified: Secondary | ICD-10-CM

## 2016-10-12 DIAGNOSIS — M6281 Muscle weakness (generalized): Secondary | ICD-10-CM | POA: Insufficient documentation

## 2016-10-12 NOTE — Therapy (Signed)
[facility] [address], [name], [place], [address] Phone: [phone]   Fax:  [phone]  Physical Therapy Treatment/Recertification  Patient Details  Name: [name] MRN: [id number] Date of Birth: December 19, 1940 Referring Provider: Kathryne Hitch, MD  Encounter Date: 10/12/2016      PT End of Session - 10/12/16 0835    Visit Number 31   Number of Visits 39   Date for PT Re-Evaluation December 13, 2016   Authorization Type G-codes at 78   PT Start Time 0758   PT Stop Time 0840   PT Time Calculation (min) 42 min   Activity Tolerance Patient tolerated treatment well      Past Medical History:  Diagnosis Date  . Abnormal thyroid function test 07/01/2013  . Arthritis   . Cancer ([place])   . Dermatitis 11/25/2013  . Dyslipidemia   . Glaucoma 11/02/2012  . HTN (hypertension) 11/02/2012  . 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 Hyperlipidemia   . Obesity     Past Surgical History:  Procedure Laterality Date  . BREAST SURGERY  10/2005   Left Mastectomy, reconstruction, reduction on right. Dr. Harlow Mares  . CRYOTHERAPY     GYN for CIN III  . DILATION AND CURETTAGE OF UTERUS    . EXCISION MORTON'S NEUROMA     right foot    There were no vitals filed for this visit.      Subjective Assessment - 10/12/16 0804    Subjective No reports of pain yesterday. MIld soreness after visit but nothing too  bad.     Currently in Pain? No/denies   Pain Score 0-No pain   Pain Location Shoulder   Pain Orientation Right   Aggravating Factors  behind the back;  putting roll on deodorant on;  overhead   Pain Relieving Factors rest, stretching            [facility] PT Assessment - 10/12/16 0001      AROM   Right Shoulder Flexion 140 Degrees   Right Shoulder ABduction 150 Degrees   Right Shoulder Internal Rotation --  right SI joint   Right Shoulder External Rotation 80 Degrees     Strength   Right Shoulder Flexion 4/5   Right Shoulder ABduction 4-/5   Right Shoulder Internal Rotation 4/5   Right Shoulder External Rotation 4-/5                     [facility] Adult PT Treatment/Exercise - 10/12/16 0001      Shoulder Exercises: Supine   Flexion AAROM;Both;15 reps   Theraband Level (Shoulder Flexion) --  cane   Other Supine Exercises Cane flexion and adduction  for ROM   Other Supine Exercises 1# 12/6:00 9:00/3 20x each  on incline wedge     Shoulder Exercises: Sidelying   External Rotation Strengthening;Right;15 reps;Weights   External Rotation Weight (lbs) 2   Other Sidelying Exercises 1# weight clocks 12/6:00, 9:00/3:00 15x each     Shoulder Exercises: Standing   Horizontal ABduction Strengthening;Right;15 reps;Theraband   Theraband Level (Shoulder Horizontal ABduction) --  red band diagonals extensions   External Rotation Strengthening;Right;20 reps;Theraband   Theraband Level (Shoulder External Rotation) Level 3 (Green)   Internal Rotation Strengthening;Right  3x10   Theraband Level (Shoulder Internal Rotation) Level 3 (Green)   Flexion Strengthening;Right;20 reps;Theraband  3x10   Theraband Level (Shoulder Flexion) Level 3 (Green)   Extension Strengthening;Right;Theraband  3x10  Theraband Level (Shoulder Extension) Level 3 (Green)   Other Standing Exercises UE Ranger on wall L8, 13, 18 10x each   Other Standing Exercises UE Ranger horizontal abduction/adduction on 8 inch step  20x                  PT Short Term Goals - 10/12/16 [id number]      PT SHORT TERM GOAL #1   Title be independent in initial HEP   Status Achieved     PT SHORT TERM GOAL #2   Title demonstrate Rt shoulder A/ROM flexion to 100 degrees to improve overhead reaching   Status Achieved     PT SHORT TERM GOAL #3   Title demonstrate 50 degrees P/ROM Rt shoulder ER to improve use of Rt UE   Status Achieved     PT SHORT TERM GOAL #4   Title report < or = to  3/10 Rt shoulder pain with movement   Status Achieved           PT Long Term Goals - 10/12/16 [id number]      PT LONG TERM GOAL #1   Title be independent in advanced HEP   Time 8   Period Weeks   Status On-going     PT LONG TERM GOAL #2   Title reduce FOTO to < or = to 36% limitation   Status Achieved     PT LONG TERM GOAL #3   Title demonstrate Rt shoulder A/ROM flexion to 120 degrees to improve overhead use   Status Achieved     PT LONG TERM GOAL #4   Title improve strength and endurance to  report < or = to 90% use of Rt UE with ADLs and self-care   Status Achieved     PT LONG TERM GOAL #5   Title demonstrate > or = to 4/5 Rt shoulder strength thoughout to improve use with home tasks and safe driving   Time 8   Period Weeks   Status On-going     PT LONG TERM GOAL #6   Title report  <or = to 2/10 Rt shoulder pain with use with home tasks and self-care   Status Achieved     PT LONG TERM GOAL #7   Title demonstrate Rt shoulder A/ROM IR to L3 to improve self care   Time 8   Period Weeks   Status On-going               Plan - 10/12/16 [id number]    Clinical Impression Statement The patient continues to make good improvements in right shoulder ROM.  She lacks 10-15 degrees of ROM with elevation, horizontal adduction and internal rotation compared to non-involved side.  Improving with glenohumeral and scapular muscular strength although endurance for holding her arm in an elevated position is still limited.   Patient needs skilled PT for appropriate progression of exercise and monitoring of response.  Without skilled therapy she may have increased stiffness and a prolonged limitation of UE function.   Progressing with long term rehab goals.  Should meet remaining goals in 4-6 weeks.     Rehab Potential Good   PT Frequency 2x / week   PT Duration 6 weeks   PT Treatment/Interventions ADLs/Self Care Home Management;Cryotherapy;Electrical Stimulation;Functional mobility  training;Moist Heat;Therapeutic activities;Therapeutic exercise;Neuromuscular re-education;Patient/family education;Passive range of motion;Manual techniques;Dry needling;Taping;Vasopneumatic Device   PT Next Visit Plan Continue 2x/week for progressive ROM to overhead reaching, horizontal adduction and abduction;   glenohumeral and scapular  strengthening      Patient will benefit from skilled therapeutic intervention in order to improve the following deficits and impairments:  Postural dysfunction, Decreased strength, Impaired flexibility, Pain, Decreased activity tolerance, Decreased endurance, Impaired UE functional use, Decreased range of motion  Visit Diagnosis: Stiffness of right shoulder, not elsewhere classified - Plan: PT plan of care cert/re-cert  Muscle weakness (generalized) - Plan: PT plan of care cert/re-cert  Acute pain of right shoulder - Plan: PT plan of care cert/re-cert     Problem List Patient Active Problem List   Diagnosis Date Noted  . Medicare annual wellness visit, subsequent 09/26/2014  . Hypothyroidism 07/01/2013  . Glaucoma 11/02/2012  . Essential hypertension 11/02/2012  . Vitamin D deficiency 11/02/2012  . Hyperlipidemia, mixed 07/24/2011  . Obesity (BMI 30-39.9) 07/24/2011  . hx: breast cancer, IDC (Microinvasive) w DCIS, 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 - 07/10/2011   Ruben Im, PT 10/12/16 8:56 AM Phone: [phone] Fax: [phone]  [name] 10/12/2016, 8:56 AM  [facility] [address] [place], [place], [address] Phone: [phone]   Fax:  [phone]  Name: LINELL PARRO MRN: [id number] Date of Birth: September 08, 1941

## 2016-10-15 LAB — LIPID PANEL
Cholesterol: 303 mg/dL — AB (ref 0–200)
HDL: 52 mg/dL (ref 35–70)
LDL Cholesterol: 220 mg/dL
Triglycerides: 154 mg/dL (ref 40–160)

## 2016-10-15 LAB — HEMOGLOBIN A1C: Hemoglobin A1C: 5.8

## 2016-10-16 ENCOUNTER — Ambulatory Visit: Payer: Medicare Other | Admitting: Physical Therapy

## 2016-10-16 ENCOUNTER — Encounter: Payer: Self-pay | Admitting: Physical Therapy

## 2016-10-16 DIAGNOSIS — M25511 Pain in right shoulder: Secondary | ICD-10-CM

## 2016-10-16 DIAGNOSIS — M25611 Stiffness of right shoulder, not elsewhere classified: Secondary | ICD-10-CM | POA: Diagnosis not present

## 2016-10-16 DIAGNOSIS — M6281 Muscle weakness (generalized): Secondary | ICD-10-CM

## 2016-10-16 NOTE — Therapy (Signed)
[facility] [address] [place], [place], [address] Phone: [phone]   Fax:  [phone]  Physical Therapy Treatment  Patient Details  Name: [name] MRN: [id number] Date of Birth: 06/09/1941 Referring Provider: Kathryne Hitch, MD  Encounter Date: 10/16/2016      PT End of Session - 10/16/16 0808    Visit Number 32   Number of Visits 39   Date for PT Re-Evaluation 06-Dec-2016   Authorization Type G-codes at 64   PT Start Time 0806   PT Stop Time 0845   PT Time Calculation (min) 39 min   Activity Tolerance Patient tolerated treatment well   Behavior During Therapy [facility] for tasks assessed/performed      Past Medical History:  Diagnosis Date  . Abnormal thyroid function test 07/01/2013  . Arthritis   . Cancer ([place])   . Dermatitis 11/25/2013  . Dyslipidemia   . Glaucoma 11/02/2012  . HTN (hypertension) 11/02/2012  . 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 Hyperlipidemia   . Obesity     Past Surgical History:  Procedure Laterality Date  . BREAST SURGERY  10/2005   Left Mastectomy, reconstruction, reduction on right. Dr. Harlow Mares  . CRYOTHERAPY     GYN for CIN III  . DILATION AND CURETTAGE OF UTERUS    . EXCISION MORTON'S NEUROMA     right foot    There were no vitals filed for this visit.      Subjective Assessment - 10/16/16 0807    Subjective Patient is still fearfull of falling on ice and is limiting outings. Shoulder feeling fine.    Pertinent History breast cancer: no [place], no UBE, no heat to Lt shoulder.  Rt humerus fracture: 05/16/16.  MD OK with strength progression as tolerated (08/21/16)   Diagnostic tests x-ray: proximal humerus fracture   Patient Stated Goals improve use of Rt UE, imporve A/ROM and strength,   Currently in Pain? No/denies   Pain Score 0-No pain                         [facility] Adult PT Treatment/Exercise - 10/16/16  0001      Shoulder Exercises: Supine   Other Supine Exercises Cane flexion and adduction  for ROM   Other Supine Exercises 2# 12/6:00 9:00/3 20x each     Shoulder Exercises: Prone   Flexion Strengthening;Right;20 reps   Extension Strengthening;Right;20 reps   Horizontal ABduction 1 Strengthening;Right;20 reps     Shoulder Exercises: Sidelying   Other Sidelying Exercises 2# weight clocks 12/6:00, 9:00/3:00 15x each     Shoulder Exercises: Standing   Horizontal ABduction Strengthening;Right;15 reps;Theraband   Theraband Level (Shoulder Horizontal ABduction) --  red band diagonals extensions   External Rotation Strengthening;Right;20 reps;Theraband   Theraband Level (Shoulder External Rotation) Level 3 (Green)   Internal Rotation Strengthening;Right  3x10   Theraband Level (Shoulder Internal Rotation) Level 3 (Green)   Flexion Strengthening;Right;20 reps;Theraband  3x10   Shoulder Flexion Weight (lbs) #2   Extension Strengthening;Right;Theraband  3x10   Extension Weight (lbs) #20   Row Strengthening;Right;Theraband  3x10   Row Weight (lbs) #15   Shoulder Elevation Limitations finger ladder: flexion/abduction x10   Other Standing Exercises UE Ranger on wall L8, 13, 18 10x each                  PT Short Term Goals - 10/12/16  0852      PT SHORT TERM GOAL #1   Title be independent in initial HEP   Status Achieved     PT SHORT TERM GOAL #2   Title demonstrate Rt shoulder A/ROM flexion to 100 degrees to improve overhead reaching   Status Achieved     PT SHORT TERM GOAL #3   Title demonstrate 50 degrees P/ROM Rt shoulder ER to improve use of Rt UE   Status Achieved     PT SHORT TERM GOAL #4   Title report < or = to 3/10 Rt shoulder pain with movement   Status Achieved           PT Long Term Goals - 10/16/16 [id number]      PT LONG TERM GOAL #1   Title be independent in advanced HEP   Time 8   Period Weeks   Status On-going     PT LONG TERM GOAL #2    Title reduce FOTO to < or = to 36% limitation   Time 8   Period Weeks   Status Achieved     PT LONG TERM GOAL #5   Title demonstrate > or = to 4/5 Rt shoulder strength thoughout to improve use with home tasks and safe driving   Time 8   Period Weeks   Status On-going     PT LONG TERM GOAL #7   Title demonstrate Rt shoulder A/ROM IR to L3 to improve self care   Time 8   Period Weeks   Status On-going               Plan - 10/16/16 0845    Clinical Impression Statement Patient did well with all strengthening exercises. Continues to have some compensation patterns for limited ROM but over all doing well and having less upper trap involvment with most movements. Pt will continue to benefit from skilled therapy for shoulder strenght and stability.    Rehab Potential Good   PT Frequency 2x / week   PT Duration 6 weeks   PT Treatment/Interventions ADLs/Self Care Home Management;Cryotherapy;Electrical Stimulation;Functional mobility training;Moist Heat;Therapeutic activities;Therapeutic exercise;Neuromuscular re-education;Patient/family education;Passive range of motion;Manual techniques;Dry needling;Taping;Vasopneumatic Device   PT Next Visit Plan Continue 2x/week for progressive ROM to overhead reaching, horizontal adduction and abduction;   glenohumeral and scapular strengthening   Consulted and Agree with Plan of Care Patient      Patient will benefit from skilled therapeutic intervention in order to improve the following deficits and impairments:  Postural dysfunction, Decreased strength, Impaired flexibility, Pain, Decreased activity tolerance, Decreased endurance, Impaired UE functional use, Decreased range of motion  Visit Diagnosis: Stiffness of right shoulder, not elsewhere classified  Muscle weakness (generalized)  Acute pain of right shoulder     Problem List Patient Active Problem List   Diagnosis Date Noted  . Medicare annual wellness visit, subsequent  09/26/2014  . Hypothyroidism 07/01/2013  . Glaucoma 11/02/2012  . Essential hypertension 11/02/2012  . Vitamin D deficiency 11/02/2012  . Hyperlipidemia, mixed 07/24/2011  . Obesity (BMI 30-39.9) 07/24/2011  . hx: breast cancer, IDC (Microinvasive) w DCIS, 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 - 07/10/2011    Mikle Bosworth PTA 10/16/2016, 8:48 AM  [facility] [address], [place], [place], [address] Phone: [phone]   Fax:  [phone]  Name: [name] MRN: [id number] Date of Birth: 07/04/1941

## 2016-10-19 ENCOUNTER — Ambulatory Visit: Payer: Medicare Other | Admitting: Physical Therapy

## 2016-10-19 ENCOUNTER — Encounter: Payer: Self-pay | Admitting: Physical Therapy

## 2016-10-19 DIAGNOSIS — M25611 Stiffness of right shoulder, not elsewhere classified: Secondary | ICD-10-CM | POA: Diagnosis not present

## 2016-10-19 DIAGNOSIS — M6281 Muscle weakness (generalized): Secondary | ICD-10-CM

## 2016-10-19 NOTE — Therapy (Signed)
[facility] [address] [place], [place], [address] Phone: [phone]   Fax:  [phone]  Physical Therapy Treatment  Patient Details  Name: BRAILY HENNIS MRN: [id number] Date of Birth: 1941/01/19 Referring Provider: Kathryne Hitch, MD  Encounter Date: 10/19/2016      PT End of Session - 10/19/16 0932    Visit Number 33   Number of Visits 39   Date for PT Re-Evaluation 12/22/2016   Authorization Type G-codes at 53   PT Start Time 0927   PT Stop Time 1010   PT Time Calculation (min) 43 min   Activity Tolerance Patient tolerated treatment well   Behavior During Therapy [facility] for tasks assessed/performed      Past Medical History:  Diagnosis Date  . Abnormal thyroid function test 07/01/2013  . Arthritis   . Cancer ([place])   . Dermatitis 11/25/2013  . Dyslipidemia   . Glaucoma 11/02/2012  . HTN (hypertension) 11/02/2012  . 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 Hyperlipidemia   . Obesity     Past Surgical History:  Procedure Laterality Date  . BREAST SURGERY  10/2005   Left Mastectomy, reconstruction, reduction on right. Dr. Harlow Mares  . CRYOTHERAPY     GYN for CIN III  . DILATION AND CURETTAGE OF UTERUS    . EXCISION MORTON'S NEUROMA     right foot    There were no vitals filed for this visit.      Subjective Assessment - 10/19/16 0930    Subjective Feel good, no pain this AM.    Currently in Pain? No/denies   Multiple Pain Sites No                         [facility] Adult PT Treatment/Exercise - 10/19/16 0001      Shoulder Exercises: Supine   Horizontal ABduction --  Red band 2x10, VC for RT arm more effort.   External Rotation --  Red band 3x10, applied MHP to chest for relaxation   Flexion Strengthening;Right;20 reps;Weights   Shoulder Flexion Weight (lbs) 3   Other Supine Exercises Red plyoball at 90 circles bil 10x     Shoulder  Exercises: Standing   Flexion --  1 plate at tower, [id number]   Flexion Limitations Finger ladder 3# 10x  TC at upper trap   ABduction --  Finger ladder 1 1/2# on wrist 10x   Row Strengthening;Both;20 reps;Weights   Row Weight (lbs) 20#   Other Standing Exercises UE ranger: L29 flexion 2x 10 , circles 2x 10      Shoulder Exercises: Pulleys   Flexion 3 minutes                  PT Short Term Goals - 10/12/16 [id number]      PT SHORT TERM GOAL #1   Title be independent in initial HEP   Status Achieved     PT SHORT TERM GOAL #2   Title demonstrate Rt shoulder A/ROM flexion to 100 degrees to improve overhead reaching   Status Achieved     PT SHORT TERM GOAL #3   Title demonstrate 50 degrees P/ROM Rt shoulder ER to improve use of Rt UE   Status Achieved     PT SHORT TERM GOAL #4   Title report < or = to 3/10 Rt shoulder pain with movement   Status Achieved  PT Long Term Goals - 10/16/16 [id number]      PT LONG TERM GOAL #1   Title be independent in advanced HEP   Time 8   Period Weeks   Status On-going     PT LONG TERM GOAL #2   Title reduce FOTO to < or = to 36% limitation   Time 8   Period Weeks   Status Achieved     PT LONG TERM GOAL #5   Title demonstrate > or = to 4/5 Rt shoulder strength thoughout to improve use with home tasks and safe driving   Time 8   Period Weeks   Status On-going     PT LONG TERM GOAL #7   Title demonstrate Rt shoulder A/ROM IR to L3 to improve self care   Time 8   Period Weeks   Status On-going               Plan - 10/19/16 0932    Rehab Potential Good   PT Duration 6 weeks   PT Treatment/Interventions ADLs/Self Care Home Management;Cryotherapy;Electrical Stimulation;Functional mobility training;Moist Heat;Therapeutic activities;Therapeutic exercise;Neuromuscular re-education;Patient/family education;Passive range of motion;Manual techniques;Dry needling;Taping;Vasopneumatic Device   Consulted and Agree with Plan  of Care Patient      Patient will benefit from skilled therapeutic intervention in order to improve the following deficits and impairments:  Postural dysfunction, Decreased strength, Impaired flexibility, Pain, Decreased activity tolerance, Decreased endurance, Impaired UE functional use, Decreased range of motion  Visit Diagnosis: Stiffness of right shoulder, not elsewhere classified  Muscle weakness (generalized)     Problem List Patient Active Problem List   Diagnosis Date Noted  . Medicare annual wellness visit, subsequent 09/26/2014  . Hypothyroidism 07/01/2013  . Glaucoma 11/02/2012  . Essential hypertension 11/02/2012  . Vitamin D deficiency 11/02/2012  . Hyperlipidemia, mixed 07/24/2011  . Obesity (BMI 30-39.9) 07/24/2011  . hx: breast cancer, IDC (Microinvasive) w DCIS, 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 - 07/10/2011    Mechille Varghese, PTA 10/19/2016, 10:07 AM  [facility] [address] [place], [place], [address] Phone: [phone]   Fax:  [phone]  Name: ARAM DEVINCENT MRN: [id number] Date of Birth: [birth year]

## 2016-10-22 ENCOUNTER — Ambulatory Visit: Payer: Medicare Other

## 2016-10-22 DIAGNOSIS — M6281 Muscle weakness (generalized): Secondary | ICD-10-CM

## 2016-10-22 DIAGNOSIS — M25611 Stiffness of right shoulder, not elsewhere classified: Secondary | ICD-10-CM | POA: Diagnosis not present

## 2016-10-22 NOTE — Therapy (Signed)
[facility] [address] [place], [place], [address] Phone: [phone]   Fax:  [phone]  Physical Therapy Treatment  Patient Details  Name: [name] MRN: [id number] Date of Birth: [birth year] Referring Provider: Kathryne Hitch, MD  Encounter Date: 10/22/2016      PT End of Session - 10/22/16 1013    Visit Number 34   Number of Visits 39   Date for PT Re-Evaluation 11-25-16   Authorization Type G-codes at 41   PT Start Time 0933   PT Stop Time 1014   PT Time Calculation (min) 41 min   Activity Tolerance Patient tolerated treatment well   Behavior During Therapy [facility] for tasks assessed/performed      Past Medical History:  Diagnosis Date  . Abnormal thyroid function test 07/01/2013  . Arthritis   . Cancer ([place])   . Dermatitis 11/25/2013  . Dyslipidemia   . Glaucoma 11/02/2012  . HTN (hypertension) 11/02/2012  . 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 Hyperlipidemia   . Obesity     Past Surgical History:  Procedure Laterality Date  . BREAST SURGERY  10/2005   Left Mastectomy, reconstruction, reduction on right. Dr. Harlow Mares  . CRYOTHERAPY     GYN for CIN III  . DILATION AND CURETTAGE OF UTERUS    . EXCISION MORTON'S NEUROMA     right foot    There were no vitals filed for this visit.      Subjective Assessment - 10/22/16 0936    Subjective Going to the MD tomorrow.     Pertinent History breast cancer: no [place], no UBE, no heat to Lt shoulder.  Rt humerus fracture: 05/16/16.  MD OK with strength progression as tolerated (08/21/16)   Patient Stated Goals improve use of Rt UE, imporve A/ROM and strength,   Currently in Pain? No/denies            [facility] PT Assessment - 10/22/16 0001      Assessment   Medical Diagnosis s/p Rt proximal humerus fracture     AROM   Right Shoulder Flexion 120 Degrees   Right Shoulder ABduction 110 Degrees   Right  Shoulder Internal Rotation --  Rt SI joint   Right Shoulder External Rotation --  to T1     Strength   Right Shoulder Flexion 4/5   Right Shoulder ABduction 4-/5   Right Shoulder Internal Rotation 4+/5   Right Shoulder External Rotation 4/5                     [facility] Adult PT Treatment/Exercise - 10/22/16 0001      Shoulder Exercises: Supine   Flexion Strengthening;Right;20 reps;Weights   Shoulder Flexion Weight (lbs) 3     Shoulder Exercises: Standing   Flexion --  1 plate at tower, [id number]   Flexion Limitations Finger ladder 3# 20x  tactile and verbal cues to reduce scapular elevation   Row Strengthening;Both;20 reps;Weights   Row Weight (lbs) 20#   Shoulder Elevation Strengthening  cone stacking 1# added on Rt 2x2 minutes   Other Standing Exercises UE ranger: L29 flexion 2x 10 , circles 2x 10    Other Standing Exercises red plyoball on wall: clockwise and counter clockwise 2x10 each direction     Shoulder Exercises: Pulleys   Flexion 3 minutes  PT Short Term Goals - 10/12/16 [id number]      PT SHORT TERM GOAL #1   Title be independent in initial HEP   Status Achieved     PT SHORT TERM GOAL #2   Title demonstrate Rt shoulder A/ROM flexion to 100 degrees to improve overhead reaching   Status Achieved     PT SHORT TERM GOAL #3   Title demonstrate 50 degrees P/ROM Rt shoulder ER to improve use of Rt UE   Status Achieved     PT SHORT TERM GOAL #4   Title report < or = to 3/10 Rt shoulder pain with movement   Status Achieved           PT Long Term Goals - 10/22/16 0943      PT LONG TERM GOAL #1   Title be independent in advanced HEP   Time 8   Period Weeks   Status On-going     PT LONG TERM GOAL #2   Title reduce FOTO to < or = to 36% limitation   Status Achieved     PT LONG TERM GOAL #3   Title demonstrate Rt shoulder A/ROM flexion to 120 degrees to improve overhead use   Status Achieved     PT LONG TERM GOAL #4    Title improve strength and endurance to  report < or = to 90% use of Rt UE with ADLs and self-care   Status Achieved     PT LONG TERM GOAL #5   Title demonstrate > or = to 4/5 Rt shoulder strength thoughout to improve use with home tasks and safe driving   Time 8   Period Weeks   Status On-going     PT LONG TERM GOAL #7   Title demonstrate Rt shoulder A/ROM IR to L3 to improve self care   Time 8   Period Weeks   Status On-going               Plan - 10/22/16 0944    Clinical Impression Statement Pt continues to tolerate progression of Rt UE strength and endurance exercises.  Pt demonstrates limited A/ROM into IR.  All other A/ROM is functional.  Pt with reduced strength although this is improving.  Tolerance for endurance exercises is also improving.  Overhead activities in the clinic were performed to improve pt tolerace for use overhead and improve endurance for functional use. Pt reports most difficulty with reaching across to Lt underarm area in addition to reaching overhead to a high cabinet.  Pt will continue to benefit from skilled PT for Rt shoulder strength and endurance progression.     Rehab Potential Good   PT Frequency 2x / week   PT Duration 6 weeks   PT Treatment/Interventions ADLs/Self Care Home Management;Cryotherapy;Electrical Stimulation;Functional mobility training;Moist Heat;Therapeutic activities;Therapeutic exercise;Neuromuscular re-education;Patient/family education;Passive range of motion;Manual techniques;Dry needling;Taping;Vasopneumatic Device   PT Next Visit Plan Continue 2x/week for progressive ROM to overhead reaching, horizontal adduction and abduction;   glenohumeral and scapular strengthening.  See what MD says.     Consulted and Agree with Plan of Care Patient      Patient will benefit from skilled therapeutic intervention in order to improve the following deficits and impairments:  Postural dysfunction, Decreased strength, Impaired flexibility,  Pain, Decreased activity tolerance, Decreased endurance, Impaired UE functional use, Decreased range of motion  Visit Diagnosis: Stiffness of right shoulder, not elsewhere classified  Muscle weakness (generalized)     Problem List Patient Active Problem  List   Diagnosis Date Noted  . Medicare annual wellness visit, subsequent 09/26/2014  . Hypothyroidism 07/01/2013  . Glaucoma 11/02/2012  . Essential hypertension 11/02/2012  . Vitamin D deficiency 11/02/2012  . Hyperlipidemia, mixed 07/24/2011  . Obesity (BMI 30-39.9) 07/24/2011  . hx: breast cancer, IDC (Microinvasive) w DCIS, 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 - 07/10/2011     Sigurd Sos, PT 10/22/16 10:16 AM  [facility] [address] [place], [place], [address] Phone: [phone]   Fax:  [phone]  Name: IDY JAGIELLO MRN: [id number] Date of Birth: 1941-05-21

## 2016-10-23 ENCOUNTER — Encounter: Payer: Medicare Other | Admitting: Physical Therapy

## 2016-10-25 ENCOUNTER — Encounter: Payer: Self-pay | Admitting: *Deleted

## 2016-10-25 ENCOUNTER — Other Ambulatory Visit: Payer: Self-pay | Admitting: Family Medicine

## 2016-10-25 DIAGNOSIS — Z9012 Acquired absence of left breast and nipple: Secondary | ICD-10-CM

## 2016-10-25 DIAGNOSIS — Z1231 Encounter for screening mammogram for malignant neoplasm of breast: Secondary | ICD-10-CM

## 2016-10-25 DIAGNOSIS — Z006 Encounter for examination for normal comparison and control in clinical research program: Secondary | ICD-10-CM

## 2016-10-25 NOTE — Progress Notes (Signed)
Late entry: Subject met inclusion and exclusion criteria.  The informed consent form, study requirements and expectations were reviewed with the subject and questions and concerns were addressed prior to the signing of the consent form.  The subject verbalized understanding of the trail requirements.  The subject agreed to participate in the CLEAR trial and signed the informed consent.  The informed consent was obtained prior to performance of any protocol-specific procedures for the subject.  A copy of the signed informed consent was given to the subject and a copy was placed in the subject's medical record.  Jake Bathe, RN October 15, 2016

## 2016-10-26 ENCOUNTER — Ambulatory Visit: Payer: Medicare Other | Admitting: Physical Therapy

## 2016-10-26 DIAGNOSIS — M25611 Stiffness of right shoulder, not elsewhere classified: Secondary | ICD-10-CM | POA: Diagnosis not present

## 2016-10-26 DIAGNOSIS — M25511 Pain in right shoulder: Secondary | ICD-10-CM

## 2016-10-26 DIAGNOSIS — M6281 Muscle weakness (generalized): Secondary | ICD-10-CM

## 2016-10-26 NOTE — Patient Instructions (Signed)
  Use moist heat to warm tissues first.   Use a 1 pound weight.  Hold stretches 30 sec each.         Ruben Im PT [facility] [address] [place], [place] [address] Phone # [phone] Fax [phone]

## 2016-10-26 NOTE — Therapy (Signed)
[facility] [address], [place], [place], [address] Phone: [phone]   Fax:  [phone]  Physical Therapy Treatment  Patient Details  Name: [name] MRN: [id number] Date of Birth: Jan 28, 1941 Referring Provider: Kathryne Hitch, MD  Encounter Date: 10/26/2016      PT End of Session - 10/26/16 1224    Visit Number 35   Number of Visits 39   Date for PT Re-Evaluation 12/03/2016   Authorization Type G-codes at 96   PT Start Time 0800   PT Stop Time 0843   PT Time Calculation (min) 43 min   Activity Tolerance Patient tolerated treatment well      Past Medical History:  Diagnosis Date  . Abnormal thyroid function test 07/01/2013  . Arthritis   . Cancer ([place])   . Dermatitis 11/25/2013  . Dyslipidemia   . Glaucoma 11/02/2012  . HTN (hypertension) 11/02/2012  . 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 Hyperlipidemia   . Obesity     Past Surgical History:  Procedure Laterality Date  . BREAST SURGERY  10/2005   Left Mastectomy, reconstruction, reduction on right. Dr. Harlow Mares  . CRYOTHERAPY     GYN for CIN III  . DILATION AND CURETTAGE OF UTERUS    . EXCISION MORTON'S NEUROMA     right foot    There were no vitals filed for this visit.      Subjective Assessment - 10/26/16 0807    Subjective The doctor said the scar tissue is what is keeping me from using my shoulder better.  He said I could have a manipulation but he didn't fully recommend it and I don't know if I can go through that.     Currently in Pain? Yes   Pain Score 1    Pain Location Shoulder   Pain Orientation Right   Pain Descriptors / Indicators Sore   Pain Type Chronic pain   Pain Onset More than a month ago   Pain Frequency Intermittent   Aggravating Factors  behind the back;  putting on deodorant   Pain Relieving Factors rest, stretching                         [facility]  Adult PT Treatment/Exercise - 10/26/16 0001      Therapeutic Activites    Other Therapeutic Activities reaching, carrying, dressing tasks     Shoulder Exercises: Supine   External Rotation --  with heat for stretching 30 sec hold   Flexion AROM;Right;5 reps   Theraband Level (Shoulder Flexion) --  with heat for stretching     Shoulder Exercises: Sidelying   Internal Rotation AROM;Strengthening;5 reps   Internal Rotation Weight (lbs) 1  hold 30 sec   ABduction AROM;Right;5 reps   ABduction Weight (lbs) 1  hold 30 sec each     Shoulder Exercises: Standing   Other Standing Exercises UE Ranger on wall flexion L18, L28 15x each;  circles 15x   Other Standing Exercises doorway UE slides for flexion 3x 30 sec hold     Moist Heat Therapy   Number Minutes Moist Heat 10 Minutes   Moist Heat Location Shoulder  during stretches                PT Education - 10/26/16 0833    Education provided Yes   Education Details supine and sidelying shoulder stretches with heat  Person(s) Educated Patient   Methods Explanation;Demonstration;Handout   Comprehension Verbalized understanding;Returned demonstration          PT Short Term Goals - 10/26/16 1229      PT SHORT TERM GOAL #1   Title be independent in initial HEP   Status Achieved     PT SHORT TERM GOAL #2   Title demonstrate Rt shoulder A/ROM flexion to 100 degrees to improve overhead reaching   Status Achieved     PT SHORT TERM GOAL #3   Title demonstrate 50 degrees P/ROM Rt shoulder ER to improve use of Rt UE   Status Achieved     PT SHORT TERM GOAL #4   Title report < or = to 3/10 Rt shoulder pain with movement   Status Achieved           PT Long Term Goals - 10/26/16 1230      PT LONG TERM GOAL #1   Title be independent in advanced HEP   Time 8   Period Weeks   Status On-going     PT LONG TERM GOAL #2   Title reduce FOTO to < or = to 36% limitation   Status Achieved     PT LONG TERM GOAL #3    Title demonstrate Rt shoulder A/ROM flexion to 120 degrees to improve overhead use   Status Achieved     PT LONG TERM GOAL #4   Title improve strength and endurance to  report < or = to 90% use of Rt UE with ADLs and self-care   Status Achieved     PT LONG TERM GOAL #5   Title demonstrate > or = to 4/5 Rt shoulder strength thoughout to improve use with home tasks and safe driving   Time 8   Period Weeks   Status On-going     PT LONG TERM GOAL #6   Title report  <or = to 2/10 Rt shoulder pain with use with home tasks and self-care   Status Achieved     PT LONG TERM GOAL #7   Title demonstrate Rt shoulder A/ROM IR to L3 to improve self care   Time 8   Period Weeks   Status On-going               Plan - 10/26/16 1225    Clinical Impression Statement Patient arrives feeling discouraged after her MD appt.  States they did a different x-ray which showed good bone healing but scar tissue is limiting her shoulder mobility.  She was given the option for a manipulation but patient does not feel she can withstand that at this time.  Treatment focus on shoulder joint and soft tissue mobility with low load, long duration stretch principle.  Encouraged home stretching 3-5 minutes every 2 hours.  Therapeutic activities performed as needed for reaching, pulling, lifting and carrying.     PT Next Visit Plan Continue 2x/week for progressive ROM to overhead reaching, horizontal adduction and abduction; assess response to  moist heat to ensure no UE edema and continue as needed with soft tissue and joint mobility exs      Patient will benefit from skilled therapeutic intervention in order to improve the following deficits and impairments:     Visit Diagnosis: Stiffness of right shoulder, not elsewhere classified  Muscle weakness (generalized)  Acute pain of right shoulder     Problem List Patient Active Problem List   Diagnosis Date Noted  . Medicare annual wellness visit,  subsequent  09/26/2014  . Hypothyroidism 07/01/2013  . Glaucoma 11/02/2012  . Essential hypertension 11/02/2012  . Vitamin D deficiency 11/02/2012  . Hyperlipidemia, mixed 07/24/2011  . Obesity (BMI 30-39.9) 07/24/2011  . hx: breast cancer, IDC (Microinvasive) w DCIS, 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 - 07/10/2011   Ruben Im, PT 10/26/16 12:33 PM Phone: [phone] Fax: [phone]  [name] 10/26/2016, 12:33 PM  [facility] [address], [place], [place], [address] Phone: [phone]   Fax:  [phone]  Name: [name] MRN: [id number] Date of Birth: November 05, 1940

## 2016-10-29 ENCOUNTER — Telehealth: Payer: Self-pay

## 2016-10-29 ENCOUNTER — Encounter: Payer: Self-pay | Admitting: Family Medicine

## 2016-10-29 ENCOUNTER — Encounter: Payer: Self-pay | Admitting: Cardiology

## 2016-10-29 LAB — HM DEXA SCAN

## 2016-10-29 NOTE — Telephone Encounter (Signed)
Pt has already been to cardiology and they placed her in a study regarding her lipids.

## 2016-10-29 NOTE — Telephone Encounter (Signed)
Abstracted labs received as follows:  Cholesterol 303 HDL 52 LDL 220 Triglycerides 154  Labs drawn 10/15/16  Please advise

## 2016-10-29 NOTE — Telephone Encounter (Signed)
We had referred to cardiology in September to get her into lipid clinic. Can you check in on this referral please?

## 2016-10-30 ENCOUNTER — Ambulatory Visit: Payer: Medicare Other | Admitting: Physician Assistant

## 2016-10-30 ENCOUNTER — Telehealth: Payer: Self-pay | Admitting: Internal Medicine

## 2016-10-30 ENCOUNTER — Ambulatory Visit: Payer: Medicare Other

## 2016-10-30 DIAGNOSIS — M6281 Muscle weakness (generalized): Secondary | ICD-10-CM

## 2016-10-30 DIAGNOSIS — M25611 Stiffness of right shoulder, not elsewhere classified: Secondary | ICD-10-CM

## 2016-10-30 DIAGNOSIS — M25511 Pain in right shoulder: Secondary | ICD-10-CM

## 2016-10-30 NOTE — Progress Notes (Deleted)
Cardiology Office Note    Date:  10/30/2016   ID:  [name], [name] [birth year], MRN [id number]  PCP:  Garret Reddish, MD  Cardiologist:  ***  Electrophysiologist: ***  Chief Complaint: Hospital follow up s/p    /  Months follow up  History of Present Illness:   [name] is a 76 y.o. female ***   Past Medical History:  Diagnosis Date  . Abnormal thyroid function test 07/01/2013  . Arthritis   . Cancer ([place])   . Dermatitis 11/25/2013  . Dyslipidemia   . Glaucoma 11/02/2012  . HTN (hypertension) 11/02/2012  . 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 Hyperlipidemia   . Obesity     Past Surgical History:  Procedure Laterality Date  . BREAST SURGERY  10/2005   Left Mastectomy, reconstruction, reduction on right. Dr. Harlow Mares  . CRYOTHERAPY     GYN for CIN III  . DILATION AND CURETTAGE OF UTERUS    . EXCISION MORTON'S NEUROMA     right foot    Current Medications: Prior to Admission medications   Medication Sig Start Date End Date Taking? Authorizing Provider  amLODipine (NORVASC) 2.5 MG tablet Take 1 tablet (2.5 mg total) by mouth daily. 10/04/16   Marin Olp, MD  B Complex Vitamins (B COMPLEX 100 PO) Take 100 mg by mouth daily.    Historical Provider, MD  Bimatoprost (LUMIGAN OP) Apply to eye as directed.    Historical Provider, MD  calcium citrate (CALCITRATE - DOSED IN MG ELEMENTAL CALCIUM) 950 MG tablet Take 200 mg of elemental calcium by mouth daily.    Historical Provider, MD  Cholecalciferol (VITAMIN D-3) 5000 UNITS TABS Take 5,000 mg by mouth daily.    Historical Provider, MD  Coenzyme Q10 (COQ10) 200 MG CAPS Take 1 capsule by mouth daily.    Historical Provider, MD  cromolyn (OPTICROM) 4 % ophthalmic solution Place 1 drop into the right eye 4 (four) times daily.    Historical Provider, MD  dorzolamide-timolol (COSOPT) 22.3-6.8 MG/ML ophthalmic solution Place 1 drop into the right eye 2 (two) times daily.     Historical Provider, MD  Javier Docker Oil 1000 MG CAPS Take by mouth. Mega Red    Historical Provider, MD  levothyroxine (SYNTHROID, LEVOTHROID) 25 MCG tablet Take 1 tablet (25 mcg total) by mouth daily before breakfast. 05/03/16   Mosie Lukes, MD  Omega-3 Fatty Acids (OMEGA III EPA+DHA PO) Take by mouth.    Historical Provider, MD  Polyethyl Glycol-Propyl Glycol (SYSTANE OP) Apply to eye as directed.    Historical Provider, MD  Probiotic Product (PROBIOTIC DAILY) CAPS Take by mouth daily. Reported on 10/10/2015    Historical Provider, MD  Vitamin D, Ergocalciferol, (DRISDOL) 50000 units CAPS capsule Take 50,000 Units by mouth as directed.  05/03/16   Historical Provider, MD    Allergies:   Livalo [pitavastatin]; Rosuvastatin; and Adhesive [tape]   Social History   Social History  . Marital status: Married    Spouse name: N/A  . Number of children: N/A  . Years of education: N/A   Social History Main Topics  . Smoking status: Never Smoker  . Smokeless tobacco: Never Used  . Alcohol use Yes     Comment: social, rare  . Drug use: No  . Sexual activity: Not on file   Other Topics Concern  . Not on file   Social History Narrative   Married 1981.  1 daughter, 2 step sons. 4 grandchildren      Oncologist      Hobbies: going out to eat, activities with church including cancer support group at church     Family History:  The patient's family history includes Colon cancer in her paternal grandmother; Hyperlipidemia in her mother and sister; Other in her father; Parkinsonism in her mother; Pneumonia in her mother. ***  ROS:   Please see the history of present illness.    ROS All other systems reviewed and are negative.   PHYSICAL EXAM:   VS:  There were no vitals taken for this visit.   GEN: Well nourished, well developed, in no acute distress HEENT: normal Neck: no JVD, carotid bruits, or masses Cardiac: ***RRR; no murmurs, rubs, or gallops,no edema  Respiratory:  clear to  auscultation bilaterally, normal work of breathing GI: soft, nontender, nondistended, + BS MS: no deformity or atrophy Skin: warm and dry, no rash Neuro:  Alert and Oriented x 3, Strength and sensation are intact Psych: euthymic mood, full affect  Wt Readings from Last 3 Encounters:  06/14/16 171 lb 9.6 oz (77.8 kg)  05/10/16 174 lb (78.9 kg)  12/10/15 160 lb (72.6 kg)      Studies/Labs Reviewed:   EKG:  EKG is ordered today.  The ekg ordered today demonstrates ***  Recent Labs: 05/03/2016: ALT 20; BUN 14; Creatinine, Ser 0.71; Hemoglobin 14.0; Platelets 241.0; Potassium 4.1; Sodium 142; TSH 4.78   Lipid Panel    Component Value Date/Time   CHOL 303 (A) 10/15/2016   TRIG 154 10/15/2016   HDL 52 10/15/2016   CHOLHDL 5 05/03/2016 0905   VLDL 35.4 05/03/2016 0905   LDLCALC 220 10/15/2016    Additional studies/ records that were reviewed today include:   Echocardiogram:  Cardiac Catheterization:     ASSESSMENT & PLAN:    1. ***    Medication Adjustments/Labs and Tests Ordered: Current medicines are reviewed at length with the patient today.  Concerns regarding medicines are outlined above.  Medication changes, Labs and Tests ordered today are listed in the Patient Instructions below. There are no Patient Instructions on file for this visit.   Jarrett Soho, [place]  10/30/2016 8:50 AM    [facility] [place], [place], [place]  [address] Phone: [phone]; Fax: [phone]

## 2016-10-30 NOTE — Therapy (Signed)
[facility] [address], [place], [place], [address] Phone: [phone]   Fax:  [phone]  Physical Therapy Treatment  Patient Details  Name: [name] MRN: [id number] Date of Birth: [birth year] Referring Provider: Kathryne Hitch, MD  Encounter Date: 10/30/2016      PT End of Session - 10/30/16 0843    Visit Number 36   Number of Visits 39   Date for PT Re-Evaluation 2016/12/09   Authorization Type G-codes at 66   PT Start Time 0801   PT Stop Time 0841   PT Time Calculation (min) 40 min   Activity Tolerance Patient tolerated treatment well   Behavior During Therapy [facility] for tasks assessed/performed      Past Medical History:  Diagnosis Date  . Abnormal thyroid function test 07/01/2013  . Arthritis   . Cancer ([place])   . Dermatitis 11/25/2013  . Dyslipidemia   . Glaucoma 11/02/2012  . HTN (hypertension) 11/02/2012  . 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 Hyperlipidemia   . Obesity     Past Surgical History:  Procedure Laterality Date  . BREAST SURGERY  10/2005   Left Mastectomy, reconstruction, reduction on right. Dr. Harlow Mares  . CRYOTHERAPY     GYN for CIN III  . DILATION AND CURETTAGE OF UTERUS    . EXCISION MORTON'S NEUROMA     right foot    There were no vitals filed for this visit.      Subjective Assessment - 10/30/16 [id number]    Subjective MD states that limited A/ROM is due to scar tissue.  MD discussed maniuplation surgery- he does not want to do as there is risk of fracturing again.     Pertinent History breast cancer: no [place], no UBE, no heat to Lt shoulder.  Rt humerus fracture: 05/16/16.  MD OK with strength progression as tolerated (08/21/16)   Patient Stated Goals improve use of Rt UE, imporve A/ROM and strength,   Currently in Pain? Yes   Pain Score 1    Pain Location Shoulder   Pain Orientation Right   Pain Descriptors / Indicators Sore   Pain Onset More than a month ago   Pain Frequency Intermittent   Aggravating Factors  behind the back, putting on deodorant   Pain Relieving Factors rest, stretching                         [facility] Adult PT Treatment/Exercise - 10/30/16 0001      Shoulder Exercises: Standing   Flexion Limitations Finger ladder  20x  tactile and verbal cues to reduce scapular elevation   Extension Strengthening;Both  3x10   Extension Weight (lbs) 20#   Row Strengthening;Both;Weights  3x10   Row Weight (lbs) 20#   Shoulder Elevation Strengthening  cone stacking 1# added on Rt 2x2 minutes   Other Standing Exercises UE Ranger on wall flexion L18, L28 15x each;  circles 15x     Shoulder Exercises: Pulleys   Flexion 3 minutes   ABduction 3 minutes     Manual Therapy   Manual Therapy Passive ROM   Passive ROM flexion, IR and ER to pt tolerance                  PT Short Term Goals - 10/26/16 1229      PT SHORT TERM GOAL #1   Title be independent in initial HEP  Status Achieved     PT SHORT TERM GOAL #2   Title demonstrate Rt shoulder A/ROM flexion to 100 degrees to improve overhead reaching   Status Achieved     PT SHORT TERM GOAL #3   Title demonstrate 50 degrees P/ROM Rt shoulder ER to improve use of Rt UE   Status Achieved     PT SHORT TERM GOAL #4   Title report < or = to 3/10 Rt shoulder pain with movement   Status Achieved           PT Long Term Goals - 10/30/16 0809      PT LONG TERM GOAL #1   Title be independent in advanced HEP   Time 8   Period Weeks   Status On-going     PT LONG TERM GOAL #2   Title reduce FOTO to < or = to 36% limitation   Baseline 31% 09/24/16   Status Achieved     PT LONG TERM GOAL #3   Title demonstrate Rt shoulder A/ROM flexion to 120 degrees to improve overhead use   Status Achieved     PT LONG TERM GOAL #5   Title demonstrate > or = to 4/5 Rt shoulder strength thoughout to improve use with home tasks and safe  driving   Time 8   Period Weeks   Status On-going     PT LONG TERM GOAL #7   Title demonstrate Rt shoulder A/ROM IR to L3 to improve self care   Time 8   Period Weeks   Status On-going               Plan - 10/30/16 0810    Clinical Impression Statement Pt is working on A/ROM and strength of the Rt shoulder.  Pt has scar tissue that is limiting her motion at this time.  Pt will continue to benefit from skilled PT for Rt UE endurance, functional motion and flexibility progression.     Rehab Potential Good   PT Frequency 2x / week   PT Duration 6 weeks   PT Treatment/Interventions ADLs/Self Care Home Management;Cryotherapy;Electrical Stimulation;Functional mobility training;Moist Heat;Therapeutic activities;Therapeutic exercise;Neuromuscular re-education;Patient/family education;Passive range of motion;Manual techniques;Dry needling;Taping;Vasopneumatic Device   PT Next Visit Plan Continue 2x/week for progressive ROM to overhead reaching, horizontal adduction and abduction;  moist heat as needed with soft tissue and joint mobility exs   Consulted and Agree with Plan of Care Patient      Patient will benefit from skilled therapeutic intervention in order to improve the following deficits and impairments:  Postural dysfunction, Decreased strength, Impaired flexibility, Pain, Decreased activity tolerance, Decreased endurance, Impaired UE functional use, Decreased range of motion  Visit Diagnosis: Stiffness of right shoulder, not elsewhere classified  Muscle weakness (generalized)  Acute pain of right shoulder     Problem List Patient Active Problem List   Diagnosis Date Noted  . Medicare annual wellness visit, subsequent 09/26/2014  . Hypothyroidism 07/01/2013  . Glaucoma 11/02/2012  . Essential hypertension 11/02/2012  . Vitamin D deficiency 11/02/2012  . Hyperlipidemia, mixed 07/24/2011  . Obesity (BMI 30-39.9) 07/24/2011  . hx: breast cancer, IDC (Microinvasive) w  DCIS, 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 - 07/10/2011     Sigurd Sos, PT 10/30/16 8:46 AM  [facility] [address] [place], [place], [address] Phone: [phone]   Fax:  [phone]  Name: [name] MRN: [id number] Date of Birth: 01-Feb-1941

## 2016-10-31 ENCOUNTER — Ambulatory Visit
Admission: RE | Admit: 2016-10-31 | Discharge: 2016-10-31 | Disposition: A | Discharge status: Home / Self Care | Payer: Medicare Other | Source: Ambulatory Visit | Attending: Family Medicine | Admitting: Family Medicine

## 2016-10-31 DIAGNOSIS — Z1231 Encounter for screening mammogram for malignant neoplasm of breast: Secondary | ICD-10-CM

## 2016-10-31 DIAGNOSIS — Z9012 Acquired absence of left breast and nipple: Secondary | ICD-10-CM

## 2016-11-01 ENCOUNTER — Encounter: Payer: Self-pay | Admitting: Family Medicine

## 2016-11-01 ENCOUNTER — Other Ambulatory Visit: Payer: Self-pay | Admitting: *Deleted

## 2016-11-01 ENCOUNTER — Ambulatory Visit: Payer: Medicare Other | Admitting: Physical Therapy

## 2016-11-01 ENCOUNTER — Encounter: Payer: Self-pay | Admitting: Physician Assistant

## 2016-11-01 ENCOUNTER — Ambulatory Visit (INDEPENDENT_AMBULATORY_CARE_PROVIDER_SITE_OTHER): Payer: Medicare Other | Admitting: Family Medicine

## 2016-11-01 VITALS — BP 152/86 | HR 96 | Temp 98.5°F | Ht 62.0 in | Wt 176.6 lb

## 2016-11-01 DIAGNOSIS — M25511 Pain in right shoulder: Secondary | ICD-10-CM

## 2016-11-01 DIAGNOSIS — E559 Vitamin D deficiency, unspecified: Secondary | ICD-10-CM

## 2016-11-01 DIAGNOSIS — R5383 Other fatigue: Secondary | ICD-10-CM | POA: Diagnosis not present

## 2016-11-01 DIAGNOSIS — R7989 Other specified abnormal findings of blood chemistry: Secondary | ICD-10-CM

## 2016-11-01 DIAGNOSIS — M25611 Stiffness of right shoulder, not elsewhere classified: Secondary | ICD-10-CM | POA: Diagnosis not present

## 2016-11-01 DIAGNOSIS — I10 Essential (primary) hypertension: Secondary | ICD-10-CM | POA: Diagnosis not present

## 2016-11-01 DIAGNOSIS — M810 Age-related osteoporosis without current pathological fracture: Secondary | ICD-10-CM | POA: Diagnosis not present

## 2016-11-01 DIAGNOSIS — M6281 Muscle weakness (generalized): Secondary | ICD-10-CM

## 2016-11-01 LAB — VITAMIN D 25 HYDROXY (VIT D DEFICIENCY, FRACTURES): VITD: 13.76 ng/mL — ABNORMAL LOW (ref 30.00–100.00)

## 2016-11-01 MED ORDER — AMBULATORY NON FORMULARY MEDICATION: 180.0000 mg | Freq: Every day | Status: DC

## 2016-11-01 MED ORDER — AMLODIPINE BESYLATE 5 MG PO TABS: 5.0000 mg | ORAL_TABLET | Freq: Every day | ORAL | 3 refills | Status: DC

## 2016-11-01 NOTE — Patient Instructions (Signed)
Blood pressure looking better. Home cuff looks great. I would love if we could get you below 140/90 as long as no  Lightheadedness or dizziness. We increased amlodipine to 5mg  today (can take 2 of the 2.5mg  pills until you run out)  Follow up with me as planned

## 2016-11-01 NOTE — Progress Notes (Signed)
Pre visit review using our clinic review tool, if applicable. No additional management support is needed unless otherwise documented below in the visit note. 

## 2016-11-01 NOTE — Therapy (Signed)
[facility] [address], [place], [place], [address] Phone: [phone]   Fax:  [phone]  Physical Therapy Treatment  Patient Details  Name: [name] MRN: [id number] Date of Birth: [birth year] Referring Provider: Kathryne Hitch, MD  Encounter Date: 11/01/2016      PT End of Session - 11/01/16 1458    Visit Number 37   Number of Visits 39   Date for PT Re-Evaluation 2016/12/10   Authorization Type G-codes at 59   PT Start Time 12-25-45   PT Stop Time 1230   PT Time Calculation (min) 43 min   Activity Tolerance Patient tolerated treatment well      Past Medical History:  Diagnosis Date  . Abnormal thyroid function test 07/01/2013  . Arthritis   . Cancer ([place])   . Dermatitis 11/25/2013  . Dyslipidemia   . Glaucoma 11/02/2012  . HTN (hypertension) 11/02/2012  . 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 Hyperlipidemia   . Obesity     Past Surgical History:  Procedure Laterality Date  . AUGMENTATION MAMMAPLASTY Left   . BREAST SURGERY  10/2005   Left Mastectomy, reconstruction, reduction on right. Dr. Harlow Mares  . CRYOTHERAPY     GYN for CIN III  . DILATION AND CURETTAGE OF UTERUS    . EXCISION MORTON'S NEUROMA     right foot  . MASTECTOMY Left   . REDUCTION MAMMAPLASTY Right     There were no vitals filed for this visit.      Subjective Assessment - 11/01/16 1147    Subjective I've been to a doctor every day this week.   This shoulder injury made me get behind on those preventitive appointments.  I can feel my shoulder more now than I did.  It's like a nagging soreness.  Doing the cones was difficult last time.  States she has been using her heating pad at home.     Diagnostic tests x-ray: proximal humerus fracture   Currently in Pain? Yes   Pain Location Shoulder   Pain Orientation Right   Pain Type Chronic pain            [facility] PT Assessment -  11/01/16 0001      AROM   Right Shoulder Flexion 136 Degrees   Right Shoulder ABduction 134 Degrees   Right Shoulder Internal Rotation --  sacrum   Right Shoulder External Rotation --  C7                     [facility] Adult PT Treatment/Exercise - 11/01/16 0001      Therapeutic Activites    Other Therapeutic Activities reaching, carrying, dressing tasks     Shoulder Exercises: Supine   Flexion AROM;Right;5 reps   Shoulder Flexion Weight (lbs) 2  cuff weight      Shoulder Exercises: Sidelying   Internal Rotation AROM;Strengthening;5 reps   Internal Rotation Weight (lbs) 2  cuff weight   ABduction AROM;Right;5 reps   ABduction Weight (lbs) 2     Shoulder Exercises: Standing   Protraction Limitations ball on wall overhead arm lifts 15x   Flexion Strengthening;Right;Left;10 reps   Shoulder Flexion Weight (lbs) 1  red ball roll up wall   Extension Strengthening;Both  3x10   Extension Weight (lbs) 20#   Row Strengthening;Both;Weights  3x10   Row Weight (lbs) 20#   Other Standing Exercises UE Ranger on wall flexion L18, L28  15x each;  circles 15x   Other Standing Exercises 1# wt on arm with ball on wall circles 4x10     Moist Heat Therapy   Number Minutes Moist Heat 10 Minutes   Moist Heat Location Shoulder  with exercises in supine                  PT Short Term Goals - 11/01/16 1508      PT SHORT TERM GOAL #1   Title be independent in initial HEP   Status Achieved     PT SHORT TERM GOAL #2   Title demonstrate Rt shoulder A/ROM flexion to 100 degrees to improve overhead reaching   Status Achieved     PT SHORT TERM GOAL #3   Title demonstrate 50 degrees P/ROM Rt shoulder ER to improve use of Rt UE   Status Achieved     PT SHORT TERM GOAL #4   Title report < or = to 3/10 Rt shoulder pain with movement           PT Long Term Goals - 11/01/16 1508      PT LONG TERM GOAL #1   Title be independent in advanced HEP   Time 8   Period  Weeks   Status On-going     PT LONG TERM GOAL #2   Title reduce FOTO to < or = to 36% limitation   Status Achieved     PT LONG TERM GOAL #3   Title demonstrate Rt shoulder A/ROM flexion to 120 degrees to improve overhead use   Status Achieved     PT LONG TERM GOAL #4   Title improve strength and endurance to  report < or = to 90% use of Rt UE with ADLs and self-care   Status Achieved     PT LONG TERM GOAL #5   Title demonstrate > or = to 4/5 Rt shoulder strength thoughout to improve use with home tasks and safe driving   Time 8   Period Weeks   Status On-going     PT LONG TERM GOAL #6   Title report  <or = to 2/10 Rt shoulder pain with use with home tasks and self-care   Status Achieved     PT LONG TERM GOAL #7   Title demonstrate Rt shoulder A/ROM IR to L3 to improve self care   Time 8   Period Weeks   Status On-going               Plan - 11/01/16 1458    Clinical Impression Statement The patient reports mild increase in shoulder discomfort with recent progression in exercise.  Small improvements in ROM in all planes although with compensatory shoulder hike.   Muscular fatigue with repetitive UE use above shoulder level.     PT Next Visit Plan Continue 2x/week for progressive ROM to overhead reaching, horizontal adduction and abduction;  moist heat as needed with soft tissue and joint mobility exs      Patient will benefit from skilled therapeutic intervention in order to improve the following deficits and impairments:     Visit Diagnosis: Stiffness of right shoulder, not elsewhere classified  Muscle weakness (generalized)  Acute pain of right shoulder     Problem List Patient Active Problem List   Diagnosis Date Noted  . Medicare annual wellness visit, subsequent 09/26/2014  . Hypothyroidism 07/01/2013  . Glaucoma 11/02/2012  . Essential hypertension 11/02/2012  . Vitamin D deficiency 11/02/2012  .  Hyperlipidemia, mixed 07/24/2011  . Obesity (BMI  30-39.9) 07/24/2011  . hx: breast cancer, IDC (Microinvasive) w DCIS, 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 - 07/10/2011   Ruben Im, PT 11/01/16 3:10 PM Phone: [phone] Fax: [phone]  [name] 11/01/2016, 3:09 PM  [facility] [address], [place], [place], [address] Phone: [phone]   Fax:  [phone]  Name: MACHAELA GRATTON MRN: [id number] Date of Birth: Jul 31, 1941

## 2016-11-01 NOTE — Assessment & Plan Note (Signed)
S:  Reached out to [place] a month ago and home BP readings were 147/high 80s. We started amlodipine 2.5mg .   Home readings after starting pill down to 145/83. Very mild edema  Home cuff verified- when I checked up to 170/98 and her reading 169/100 BP Readings from Last 3 Encounters:  11/01/16 (!) 152/86  06/14/16 (!) 168/108  05/10/16 (!) 180/92  A/P:Continue current meds:  But increase dose to 5mg . She sees me back within a month so we can recheck at that time  She also needed Orders from Dr. Layne Benton. Reported low D, osteoporosis, fatigue from her. In study for osteoporosis. These were ordered under my name today as we are unable to accept orders from outside physician like a [facility] would- she will get through mychart and let Dr. Layne Benton know

## 2016-11-01 NOTE — Progress Notes (Signed)
Subjective:  [name] is a 76 y.o. year old very pleasant female patient who presents for/with See problem oriented charting ROS- mild edema. No chest pain or shortness of breath. No cough, congestion.    Past Medical History-  Patient Active Problem List   Diagnosis Date Noted  . Hypothyroidism 07/01/2013    Priority: Medium  . Essential hypertension 11/02/2012    Priority: Medium  . Hyperlipidemia, mixed 07/24/2011    Priority: Medium  . 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 Medicare annual wellness visit, subsequent 09/26/2014    Priority: Low  . Glaucoma 11/02/2012    Priority: Low  . Vitamin D deficiency 11/02/2012    Priority: Low  . Obesity (BMI 30-39.9) 07/24/2011    Priority: Low    Medications- reviewed and updated Current Outpatient Prescriptions  Medication Sig Dispense Refill  . amLODipine (NORVASC) 5 MG tablet Take 1 tablet (5 mg total) by mouth daily. 90 tablet 3  . B Complex Vitamins (B COMPLEX 100 PO) Take 100 mg by mouth daily.    . Bimatoprost (LUMIGAN OP) Apply to eye as directed.    . calcium citrate (CALCITRATE - DOSED IN MG ELEMENTAL CALCIUM) 950 MG tablet Take 200 mg of elemental calcium by mouth daily.    . Cholecalciferol (VITAMIN D-3) 5000 UNITS TABS Take 5,000 mg by mouth daily.    . Coenzyme Q10 (COQ10) 200 MG CAPS Take 1 capsule by mouth daily.    . cromolyn (OPTICROM) 4 % ophthalmic solution Place 1 drop into the right eye 4 (four) times daily.    . dorzolamide-timolol (COSOPT) 22.3-6.8 MG/ML ophthalmic solution Place 1 drop into the right eye 2 (two) times daily.    Javier Docker Oil 1000 MG CAPS Take by mouth. Mega Red    . levothyroxine (SYNTHROID, LEVOTHROID) 25 MCG tablet Take 1 tablet (25 mcg total) by mouth daily before breakfast. 30 tablet 3  . Omega-3 Fatty Acids (OMEGA III EPA+DHA PO) Take by mouth.    Vladimir Faster Glycol-Propyl Glycol (SYSTANE OP) Apply to eye as directed.     . Probiotic Product (PROBIOTIC DAILY) CAPS Take by mouth daily. Reported on 10/10/2015     No current facility-administered medications for this visit.     Objective: BP (!) 152/86 (BP Location: Right Arm, Patient Position: Sitting, Cuff Size: Large)   Pulse 96   Temp 98.5 F (36.9 C) (Oral)   Ht 5\' 2"  (1.575 m)   Wt 176 lb 9.6 oz (80.1 kg)   SpO2 95%   BMI 32.30 kg/m  Gen: NAD, resting comfortably CV: RRR no murmurs rubs or gallops Lungs: CTAB no crackles, wheeze, rhonchi Abdomen: soft/nontender/nondistended/normal bowel sounds. Obese Skin: warm, dry, no rash  Home cuff verified  Assessment/Plan:  Essential hypertension S:  Reached out to [place] a month ago and home BP readings were 147/high 80s. We started amlodipine 2.5mg .   Home readings after starting pill down to 145/83. Very mild edema  Home cuff verified- when I checked up to 170/98 and her reading 169/100 BP Readings from Last 3 Encounters:  11/01/16 (!) 152/86  06/14/16 (!) 168/108  05/10/16 (!) 180/92  A/P:Continue current meds:  But increase dose to 5mg . She sees me back within a month so we can recheck at that time  She also needed Orders from Dr. Layne Benton. Reported low D, osteoporosis, fatigue from her. In study for osteoporosis. These were ordered under  my name today as we are unable to accept orders from outside physician like a [facility] would- she will get through [facility] and let Dr. Layne Benton know   No Follow-up on file.  Orders Placed This Encounter  Procedures  . VITAMIN D 25 Hydroxy (Vit-D Deficiency, Fractures)    [place]  . PTH, intact and calcium    Meds ordered this encounter  Medications  . amLODipine (NORVASC) 5 MG tablet    Sig: Take 1 tablet (5 mg total) by mouth daily.    Dispense:  90 tablet    Refill:  3    Return precautions advised.  Garret Reddish, MD

## 2016-11-02 ENCOUNTER — Ambulatory Visit: Payer: Medicare Other

## 2016-11-02 LAB — PTH, INTACT AND CALCIUM
Calcium: 9.1 mg/dL (ref 8.6–10.4)
PTH: 53 pg/mL (ref 14–64)

## 2016-11-05 ENCOUNTER — Encounter: Payer: Self-pay | Admitting: Family Medicine

## 2016-11-05 DIAGNOSIS — M810 Age-related osteoporosis without current pathological fracture: Secondary | ICD-10-CM | POA: Insufficient documentation

## 2016-11-06 ENCOUNTER — Ambulatory Visit: Payer: Medicare Other | Admitting: Physical Therapy

## 2016-11-06 DIAGNOSIS — M25611 Stiffness of right shoulder, not elsewhere classified: Secondary | ICD-10-CM

## 2016-11-06 DIAGNOSIS — M25511 Pain in right shoulder: Secondary | ICD-10-CM

## 2016-11-06 DIAGNOSIS — M6281 Muscle weakness (generalized): Secondary | ICD-10-CM

## 2016-11-06 NOTE — Telephone Encounter (Signed)
[name] is enrolled in the [place] study.

## 2016-11-06 NOTE — Therapy (Signed)
[facility] [address], [place], [place], [address] Phone: [phone]   Fax:  [phone]  Physical Therapy Treatment  Patient Details  Name: [name] MRN: [id number] Date of Birth: [birth year] Referring Provider: Kathryne Hitch, MD  Encounter Date: 11/06/2016      PT End of Session - 11/06/16 0907    Visit Number 38   Number of Visits 39   Date for PT Re-Evaluation 12/06/2016   Authorization Type G-codes at 60   PT Start Time 0845   PT Stop Time 0927   PT Time Calculation (min) 42 min   Activity Tolerance Patient tolerated treatment well      Past Medical History:  Diagnosis Date  . Abnormal thyroid function test 07/01/2013  . Arthritis   . Cancer ([place])   . Dermatitis 11/25/2013  . Dyslipidemia   . Glaucoma 11/02/2012  . HTN (hypertension) 11/02/2012  . 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 Hyperlipidemia   . Obesity     Past Surgical History:  Procedure Laterality Date  . AUGMENTATION MAMMAPLASTY Left   . BREAST SURGERY  10/2005   Left Mastectomy, reconstruction, reduction on right. Dr. Harlow Mares  . CRYOTHERAPY     GYN for CIN III  . DILATION AND CURETTAGE OF UTERUS    . EXCISION MORTON'S NEUROMA     right foot  . MASTECTOMY Left   . REDUCTION MAMMAPLASTY Right     There were no vitals filed for this visit.      Subjective Assessment - 11/06/16 0855    Subjective I dropped a can on the top of my foot.  No changes in my shoulder.     Currently in Pain? No/denies   Pain Score 0-No pain   Pain Location Shoulder   Pain Orientation Right   Pain Type Chronic pain   Aggravating Factors  behind back, putting on deodorant   Pain Relieving Factors heat                         [facility] Adult PT Treatment/Exercise - 11/06/16 0001      Therapeutic Activites    Other Therapeutic Activities reaching, carrying, dressing tasks     Shoulder  Exercises: Supine   External Rotation --  with heat for stretching 30 sec hold   Flexion AROM;Right;5 reps   Shoulder Flexion Weight (lbs) 2     Shoulder Exercises: Sidelying   Internal Rotation AROM;Strengthening;5 reps   Internal Rotation Weight (lbs) 2  cuff weight   ABduction AROM;Right;5 reps   ABduction Weight (lbs) 2     Shoulder Exercises: Standing   Horizontal ABduction --  green band diagonal extensions 15x D1/D2   Other Standing Exercises UE Ranger on wall flexion L18, L28 15x each;  circles 15x  UE Ranger IR behind back 15x   Other Standing Exercises beach ball wall touches overhead 15x     Moist Heat Therapy   Number Minutes Moist Heat 10 Minutes   Moist Heat Location Shoulder  with exercises in supine                  PT Short Term Goals - 11/06/16 0911      PT SHORT TERM GOAL #1   Title be independent in initial HEP   Status Achieved     PT SHORT TERM GOAL #2   Title demonstrate Rt shoulder A/ROM  flexion to 100 degrees to improve overhead reaching   Status Achieved     PT SHORT TERM GOAL #3   Title demonstrate 50 degrees P/ROM Rt shoulder ER to improve use of Rt UE   Status Achieved     PT SHORT TERM GOAL #4   Title report < or = to 3/10 Rt shoulder pain with movement   Status Achieved           PT Long Term Goals - 11/06/16 0911      PT LONG TERM GOAL #1   Title be independent in advanced HEP   Time 8   Period Weeks   Status On-going     PT LONG TERM GOAL #2   Title reduce FOTO to < or = to 36% limitation   Status Achieved     PT LONG TERM GOAL #3   Title demonstrate Rt shoulder A/ROM flexion to 120 degrees to improve overhead use   Status Achieved     PT LONG TERM GOAL #4   Title improve strength and endurance to  report < or = to 90% use of Rt UE with ADLs and self-care   Status Achieved     PT LONG TERM GOAL #5   Title demonstrate > or = to 4/5 Rt shoulder strength thoughout to improve use with home tasks and safe  driving   Time 8   Period Weeks   Status On-going     PT LONG TERM GOAL #6   Title report  <or = to 2/10 Rt shoulder pain with use with home tasks and self-care   Status Achieved     PT LONG TERM GOAL #7   Title demonstrate Rt shoulder A/ROM IR to L3 to improve self care   Time 8   Period Weeks   Status On-going               Plan - 11/06/16 [id number]    Clinical Impression Statement The patient reports no shoulder pain and is able to tolerate shoulder stretching with minimal discomfort.  Patient states she doesn't want to have a manipulation.  Therapeutic activities performed for reaching, pulling tasks needed for ADLS.  Slow gains in ROM but is progressing.  Going to see Dr. Percell Miller on Tuesday.     PT Next Visit Plan G code;FOTO;   send MD note for Tues appt;  Continue 2x/week for progressive ROM to overhead reaching, horizontal adduction and abduction;  moist heat as needed with soft tissue and joint mobility exs      Patient will benefit from skilled therapeutic intervention in order to improve the following deficits and impairments:     Visit Diagnosis: Stiffness of right shoulder, not elsewhere classified  Muscle weakness (generalized)  Acute pain of right shoulder     Problem List Patient Active Problem List   Diagnosis Date Noted  . Osteoporosis 11/05/2016  . Medicare annual wellness visit, subsequent 09/26/2014  . Hypothyroidism 07/01/2013  . Glaucoma 11/02/2012  . Essential hypertension 11/02/2012  . Vitamin D deficiency 11/02/2012  . Hyperlipidemia, mixed 07/24/2011  . Obesity (BMI 30-39.9) 07/24/2011  . hx: breast cancer, IDC (Microinvasive) w DCIS, 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 - 07/10/2011   Ruben Im, PT 11/06/16 10:01 AM Phone: [phone] Fax: [phone]  [name] 11/06/2016, 10:00 AM  [facility] [address], [place], [place], [address] Phone: [phone]   Fax:   [phone]  Name: [name] MRN:  [id number] Date of Birth: 27-Jun-1941

## 2016-11-09 ENCOUNTER — Ambulatory Visit: Payer: Medicare Other | Attending: Orthopedic Surgery | Admitting: Physical Therapy

## 2016-11-09 DIAGNOSIS — M6281 Muscle weakness (generalized): Secondary | ICD-10-CM

## 2016-11-09 DIAGNOSIS — M25611 Stiffness of right shoulder, not elsewhere classified: Secondary | ICD-10-CM | POA: Insufficient documentation

## 2016-11-09 DIAGNOSIS — M25511 Pain in right shoulder: Secondary | ICD-10-CM | POA: Insufficient documentation

## 2016-11-09 NOTE — Therapy (Signed)
[facility] [address] [place], [place], [address] Phone: [phone]   Fax:  [phone]  Physical Therapy Treatment  Patient Details  Name: [name] MRN: [id number] Date of Birth: 1941-07-31 Referring Provider: Kathryne Hitch, MD  Encounter Date: 11/09/2016      PT End of Session - 11/09/16 0821    Visit Number 39   Number of Visits 49   Date for PT Re-Evaluation November 26, 2016   Authorization Type G-codes at 42   PT Start Time 0800   PT Stop Time 0840   PT Time Calculation (min) 40 min   Activity Tolerance Patient tolerated treatment well      Past Medical History:  Diagnosis Date  . Abnormal thyroid function test 07/01/2013  . Arthritis   . Cancer ([place])   . Dermatitis 11/25/2013  . Dyslipidemia   . Glaucoma 11/02/2012  . HTN (hypertension) 11/02/2012  . 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 Hyperlipidemia   . Obesity     Past Surgical History:  Procedure Laterality Date  . AUGMENTATION MAMMAPLASTY Left   . BREAST SURGERY  10/2005   Left Mastectomy, reconstruction, reduction on right. Dr. Harlow Mares  . CRYOTHERAPY     GYN for CIN III  . DILATION AND CURETTAGE OF UTERUS    . EXCISION MORTON'S NEUROMA     right foot  . MASTECTOMY Left   . REDUCTION MAMMAPLASTY Right     There were no vitals filed for this visit.      Subjective Assessment - 11/09/16 0806    Subjective No complaints today.  "I think I've just got a few more times here."  My vitamin D was really low and Dr. Percell Miller referred me to another doctor.     Pertinent History breast cancer: no [place], no UBE, no heat to Lt shoulder.  Rt humerus fracture: 05/16/16.  MD OK with strength progression as tolerated (08/21/16)   Currently in Pain? No/denies   Pain Score 0-No pain   Pain Location Shoulder   Pain Orientation Right   Pain Type Chronic pain   Aggravating Factors  weather   Pain Relieving  Factors heat            [facility] PT Assessment - 11/09/16 0001      Observation/Other Assessments   Focus on Therapeutic Outcomes (FOTO)  31% limitation                     [facility] Adult PT Treatment/Exercise - 11/09/16 0001      Therapeutic Activites    Other Therapeutic Activities reaching, carrying, dressing tasks     Shoulder Exercises: Supine   External Rotation --  2# 3x 30 sec stretch   Flexion AROM;Right;5 reps   Shoulder Flexion Weight (lbs) 2  cuff weight   Other Supine Exercises shoulder sweeping 10x     Shoulder Exercises: Seated   Other Seated Exercises seated green ball flexion 15x     Shoulder Exercises: Sidelying   Internal Rotation AROM;Strengthening;5 reps   Internal Rotation Weight (lbs) 2   ABduction AROM;Right;5 reps   ABduction Weight (lbs) 2     Shoulder Exercises: Standing   Other Standing Exercises UE Ranger on wall flexion L18, L28 15x each;  circles 15x  UE Ranger IR behind back 15x   Other Standing Exercises beach ball wall touches overhead 15x  PT Short Term Goals - 24-Nov-2016 0845      PT SHORT TERM GOAL #1   Title be independent in initial HEP   Status Achieved     PT SHORT TERM GOAL #2   Title demonstrate Rt shoulder A/ROM flexion to 100 degrees to improve overhead reaching   Status Achieved     PT SHORT TERM GOAL #3   Title demonstrate 50 degrees P/ROM Rt shoulder ER to improve use of Rt UE   Status Achieved     PT SHORT TERM GOAL #4   Title report < or = to 3/10 Rt shoulder pain with movement   Status Achieved           PT Long Term Goals - 24-Nov-2016 0846      PT LONG TERM GOAL #1   Title be independent in advanced HEP   Time 8   Period Weeks   Status On-going     PT LONG TERM GOAL #2   Title reduce FOTO to < or = to 36% limitation   Status Achieved     PT LONG TERM GOAL #3   Title demonstrate Rt shoulder A/ROM flexion to 120 degrees to improve overhead use   Status Achieved      PT LONG TERM GOAL #4   Title improve strength and endurance to  report < or = to 90% use of Rt UE with ADLs and self-care   Status Achieved     PT LONG TERM GOAL #5   Title demonstrate > or = to 4/5 Rt shoulder strength thoughout to improve use with home tasks and safe driving   Time 8   Period Weeks   Status On-going     PT LONG TERM GOAL #6   Title report  <or = to 2/10 Rt shoulder pain with use with home tasks and self-care   Status Achieved     PT LONG TERM GOAL #7   Title demonstrate Rt shoulder A/ROM IR to L3 to improve self care   Time 8   Period Weeks   Status On-going               Plan - 11-24-2016 [id number]    Clinical Impression Statement Good improvement with FOTO functional outcome score.  Patient continues to limited with overhead motions, horizontal adduction and internal rotation.  She is able to perform low load long duration stretching  with a 2# weight.  Added shoulder sweeping in middle of bed.  Decreased compensatory shoulder hike.  Continue to instruct in progressive HEP for further gains in ROM and strength upon discharge in 1-2 weeks.     PT Next Visit Plan   recheck shoulder ROM and MMT; send MD note for Tues appt;  Continue 2x/week 1-2 weeks for progressive ROM to overhead reaching, horizontal adduction and abduction;  moist heat as needed with soft tissue and joint mobility exs      Patient will benefit from skilled therapeutic intervention in order to improve the following deficits and impairments:     Visit Diagnosis: Stiffness of right shoulder, not elsewhere classified  Muscle weakness (generalized)  Acute pain of right shoulder       G-Codes - 2016-11-24 0846    Functional Assessment Tool Used (Outpatient Only) FOTO; clinical impression   Functional Limitation Other PT primary   Other PT Primary Current Status [id number]) At least 40 percent but less than 60 percent impaired, limited or restricted   Other PT Primary Goal  Status [id number]) At  least 20 percent but less than 40 percent impaired, limited or restricted      Problem List Patient Active Problem List   Diagnosis Date Noted  . Osteoporosis 11/05/2016  . Medicare annual wellness visit, subsequent 09/26/2014  . Hypothyroidism 07/01/2013  . Glaucoma 11/02/2012  . Essential hypertension 11/02/2012  . Vitamin D deficiency 11/02/2012  . Hyperlipidemia, mixed 07/24/2011  . Obesity (BMI 30-39.9) 07/24/2011  . hx: breast cancer, IDC (Microinvasive) w DCIS, 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 - 07/10/2011    Ruben Im, PT 11/09/16 8:48 AM Phone: [phone] Fax: [phone] [name] 11/09/2016, 8:48 AM  [facility] [address] [place], [place], [address] Phone: [phone]   Fax:  [phone]  Name: [name] MRN: [id number] Date of Birth: 01/29/1941

## 2016-11-13 ENCOUNTER — Ambulatory Visit: Payer: Medicare Other | Admitting: Physical Therapy

## 2016-11-13 DIAGNOSIS — M25511 Pain in right shoulder: Secondary | ICD-10-CM

## 2016-11-13 DIAGNOSIS — M25611 Stiffness of right shoulder, not elsewhere classified: Secondary | ICD-10-CM

## 2016-11-13 DIAGNOSIS — M6281 Muscle weakness (generalized): Secondary | ICD-10-CM

## 2016-11-13 NOTE — Therapy (Signed)
[facility] [address] [place], [place], [address] Phone: [phone]   Fax:  [phone]  Physical Therapy Treatment  Patient Details  Name: [name] MRN: [id number] Date of Birth: April 20, 1941 Referring Provider: Kathryne Hitch, MD  Encounter Date: 11/13/2016      PT End of Session - 11/13/16 0815    Visit Number 40   Number of Visits 49   Date for PT Re-Evaluation 11/30/2016   Authorization Type G-codes at 68   PT Start Time 0802   PT Stop Time 0840   PT Time Calculation (min) 38 min   Activity Tolerance Patient tolerated treatment well      Past Medical History:  Diagnosis Date  . Abnormal thyroid function test 07/01/2013  . Arthritis   . Cancer ([place])   . Dermatitis 11/25/2013  . Dyslipidemia   . Glaucoma 11/02/2012  . HTN (hypertension) 11/02/2012  . 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 Hyperlipidemia   . Obesity     Past Surgical History:  Procedure Laterality Date  . AUGMENTATION MAMMAPLASTY Left   . BREAST SURGERY  10/2005   Left Mastectomy, reconstruction, reduction on right. Dr. Harlow Mares  . CRYOTHERAPY     GYN for CIN III  . DILATION AND CURETTAGE OF UTERUS    . EXCISION MORTON'S NEUROMA     right foot  . MASTECTOMY Left   . REDUCTION MAMMAPLASTY Right     There were no vitals filed for this visit.      Subjective Assessment - 11/13/16 0805    Subjective I see Dr. Percell Miller today.  Yesterday morning I was able to reach under my left arm easier.  I just keep going, I have a high tolerance for pain.     Currently in Pain? No/denies   Pain Score 0-No pain   Pain Location Shoulder   Pain Orientation Right   Pain Type Chronic pain   Pain Onset More than a month ago   Pain Frequency Intermittent   Aggravating Factors  when I overdo it, too much in one day (I have to do a lot for my husband)   Pain Relieving Factors I just keep going             [facility] PT Assessment - 11/13/16 0001      Observation/Other Assessments   Focus on Therapeutic Outcomes (FOTO)  31% limitation     AROM   Right Shoulder Flexion 140 Degrees   Right Shoulder ABduction 138 Degrees   Right Shoulder Internal Rotation --  sacrum   Right Shoulder External Rotation 76 Degrees     Strength   Right Shoulder Flexion 4/5   Right Shoulder ABduction 4/5   Right Shoulder Internal Rotation 4+/5   Right Shoulder External Rotation 4/5                     [facility] Adult PT Treatment/Exercise - 11/13/16 0001      Therapeutic Activites    Other Therapeutic Activities reaching, carrying, dressing tasks     Shoulder Exercises: Supine   External Rotation --  2# 3x 30 sec stretch   Flexion AROM;Right;5 reps   Shoulder Flexion Weight (lbs) 2  cuff weight   Other Supine Exercises shoulder sweeping 10x     Shoulder Exercises: Sidelying   Internal Rotation AROM;Strengthening;5 reps   Internal Rotation Weight (lbs) 2   ABduction AROM;Right;5 reps  ABduction Weight (lbs) 2     Shoulder Exercises: Standing   Other Standing Exercises Level 20 on wall flexion 2 min; circles 2 min     Moist Heat Therapy   Number Minutes Moist Heat 10 Minutes   Moist Heat Location Shoulder  with exercises in supine      Beach ball overhead on wall various angles 2x10            PT Short Term Goals - 11/13/16 0815      PT SHORT TERM GOAL #1   Title be independent in initial HEP   Status Achieved     PT SHORT TERM GOAL #2   Title demonstrate Rt shoulder A/ROM flexion to 100 degrees to improve overhead reaching   Status Achieved     PT SHORT TERM GOAL #3   Title demonstrate 50 degrees P/ROM Rt shoulder ER to improve use of Rt UE   Status Achieved     PT SHORT TERM GOAL #4   Title report < or = to 3/10 Rt shoulder pain with movement   Status Achieved           PT Long Term Goals - 11/13/16 0816      PT LONG TERM GOAL #1   Title be  independent in advanced HEP   Time 8   Period Weeks   Status On-going     PT LONG TERM GOAL #2   Title reduce FOTO to < or = to 36% limitation   Status Achieved     PT LONG TERM GOAL #3   Title demonstrate Rt shoulder A/ROM flexion to 120 degrees to improve overhead use   Status Achieved     PT LONG TERM GOAL #4   Title improve strength and endurance to  report < or = to 90% use of Rt UE with ADLs and self-care   Status Achieved     PT LONG TERM GOAL #5   Title demonstrate > or = to 4/5 Rt shoulder strength thoughout to improve use with home tasks and safe driving   Time 8   Period Weeks   Status On-going     PT LONG TERM GOAL #6   Title report  <or = to 2/10 Rt shoulder pain with use with home tasks and self-care   Status Achieved     PT LONG TERM GOAL #7   Title demonstrate Rt shoulder A/ROM IR to L3 to improve self care   Time 8   Period Weeks   Status On-going               Plan - 11/13/16 [id number]    Clinical Impression Statement The patient has made improvements in shoulder ROM (approx 2-3 degrees improvement in all planes from last week's measurements).  Improved functional improvements with reaching to her opposite armpit for grooming purposes.  Strength grossly 4/5 throughout.  She has been using her arm functionally and spontaneously.  Recommend only 2-3 visits of PT to establish progressive HEP for further improvements in ROM and strength over time.     PT Next Visit Plan continue 2-3 visits  for progressive ROM to overhead reaching, horizontal adduction and abduction progressive HEP establishment;  moist heat as needed with soft tissue and joint mobility exs;  See how MD appt went      Patient will benefit from skilled therapeutic intervention in order to improve the following deficits and impairments:     Visit Diagnosis: Stiffness of right  shoulder, not elsewhere classified  Muscle weakness (generalized)  Acute pain of right shoulder     Problem  List Patient Active Problem List   Diagnosis Date Noted  . Osteoporosis 11/05/2016  . Medicare annual wellness visit, subsequent 09/26/2014  . Hypothyroidism 07/01/2013  . Glaucoma 11/02/2012  . Essential hypertension 11/02/2012  . Vitamin D deficiency 11/02/2012  . Hyperlipidemia, mixed 07/24/2011  . Obesity (BMI 30-39.9) 07/24/2011  . hx: breast cancer, IDC (Microinvasive) w DCIS, 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 - 07/10/2011   Ruben Im, PT 11/13/16 8:43 AM Phone: [phone] Fax: [phone]  [name] 11/13/2016, 8:42 AM  [facility] [address], [place], [place], [address] Phone: [phone]   Fax:  [phone]  Name: [name] MRN: [id number] Date of Birth: [birth year]

## 2016-11-13 NOTE — Therapy (Signed)
[facility] [address], [place], [place], [address] Phone: [phone]   Fax:  [phone]  Physical Therapy Treatment  Patient Details  Name: [name] MRN: [id number] Date of Birth: 05/22/1941 Referring Provider: Kathryne Hitch, MD  Encounter Date: 11/13/2016      PT End of Session - 11/13/16 0815    Visit Number 30   Number of Visits 49   Date for PT Re-Evaluation 16-Dec-2016   Authorization Type G-codes at 29   PT Start Time 0802   PT Stop Time 0840   PT Time Calculation (min) 38 min   Activity Tolerance Patient tolerated treatment well      Past Medical History:  Diagnosis Date  . Abnormal thyroid function test 07/01/2013  . Arthritis   . Cancer ([place])   . Dermatitis 11/25/2013  . Dyslipidemia   . Glaucoma 11/02/2012  . HTN (hypertension) 11/02/2012  . 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 Hyperlipidemia   . Obesity     Past Surgical History:  Procedure Laterality Date  . AUGMENTATION MAMMAPLASTY Left   . BREAST SURGERY  10/2005   Left Mastectomy, reconstruction, reduction on right. Dr. Harlow Mares  . CRYOTHERAPY     GYN for CIN III  . DILATION AND CURETTAGE OF UTERUS    . EXCISION MORTON'S NEUROMA     right foot  . MASTECTOMY Left   . REDUCTION MAMMAPLASTY Right     There were no vitals filed for this visit.      Subjective Assessment - 11/13/16 0805    Subjective I see Dr. Percell Miller today.  Yesterday morning I was able to reach under my left arm easier.  I just keep going, I have a high tolerance for pain.     Currently in Pain? No/denies   Pain Score 0-No pain   Pain Location Shoulder   Pain Orientation Right   Pain Type Chronic pain   Pain Onset More than a month ago   Pain Frequency Intermittent   Aggravating Factors  when I overdo it, too much in one day (I have to do a lot for my husband)   Pain Relieving Factors I just keep going             [facility] PT Assessment - 11/13/16 0001      Observation/Other Assessments   Focus on Therapeutic Outcomes (FOTO)  31% limitation     AROM   Right Shoulder Flexion 140 Degrees   Right Shoulder ABduction 138 Degrees   Right Shoulder Internal Rotation --  sacrum   Right Shoulder External Rotation 76 Degrees     Strength   Right Shoulder Flexion 4/5   Right Shoulder ABduction 4/5   Right Shoulder Internal Rotation 4+/5   Right Shoulder External Rotation 4/5                     [facility] Adult PT Treatment/Exercise - 11/13/16 0001      Therapeutic Activites    Other Therapeutic Activities reaching, carrying, dressing tasks     Shoulder Exercises: Supine   External Rotation --  2# 3x 30 sec stretch   Flexion AROM;Right;5 reps   Shoulder Flexion Weight (lbs) 2  cuff weight   Other Supine Exercises shoulder sweeping 10x     Shoulder Exercises: Sidelying   Internal Rotation AROM;Strengthening;5 reps   Internal Rotation Weight (lbs) 2   ABduction AROM;Right;5 reps  ABduction Weight (lbs) 2     Shoulder Exercises: Standing   Other Standing Exercises Level 20 on wall flexion 2 min; circles 2 min     Moist Heat Therapy   Number Minutes Moist Heat 10 Minutes   Moist Heat Location Shoulder  with exercises in supine                  PT Short Term Goals - 11/13/16 0815      PT SHORT TERM GOAL #1   Title be independent in initial HEP   Status Achieved     PT SHORT TERM GOAL #2   Title demonstrate Rt shoulder A/ROM flexion to 100 degrees to improve overhead reaching   Status Achieved     PT SHORT TERM GOAL #3   Title demonstrate 50 degrees P/ROM Rt shoulder ER to improve use of Rt UE   Status Achieved     PT SHORT TERM GOAL #4   Title report < or = to 3/10 Rt shoulder pain with movement   Status Achieved           PT Long Term Goals - 11/13/16 0816      PT LONG TERM GOAL #1   Title be independent in advanced HEP   Time 8    Period Weeks   Status On-going     PT LONG TERM GOAL #2   Title reduce FOTO to < or = to 36% limitation   Status Achieved     PT LONG TERM GOAL #3   Title demonstrate Rt shoulder A/ROM flexion to 120 degrees to improve overhead use   Status Achieved     PT LONG TERM GOAL #4   Title improve strength and endurance to  report < or = to 90% use of Rt UE with ADLs and self-care   Status Achieved     PT LONG TERM GOAL #5   Title demonstrate > or = to 4/5 Rt shoulder strength thoughout to improve use with home tasks and safe driving   Time 8   Period Weeks   Status On-going     PT LONG TERM GOAL #6   Title report  <or = to 2/10 Rt shoulder pain with use with home tasks and self-care   Status Achieved     PT LONG TERM GOAL #7   Title demonstrate Rt shoulder A/ROM IR to L3 to improve self care   Time 8   Period Weeks   Status On-going               Plan - 11/13/16 [id number]    Clinical Impression Statement The patient has made improvements in shoulder ROM (approx 2-3 degrees improvement in all planes from last week's measurements).  Improved functional improvements with reaching to her opposite armpit for grooming purposes.  Strength grossly 4/5 throughout.  She has been using her arm functionally and spontaneously.  Recommend only 2-3 visits of PT to establish progressive HEP for further improvements in ROM and strength over time.     PT Next Visit Plan continue 2-3 visits  for progressive ROM to overhead reaching, horizontal adduction and abduction progressive HEP establishment;  moist heat as needed with soft tissue and joint mobility exs      Patient will benefit from skilled therapeutic intervention in order to improve the following deficits and impairments:     Visit Diagnosis: Stiffness of right shoulder, not elsewhere classified  Muscle weakness (generalized)  Acute pain of right  shoulder     Problem List Patient Active Problem List   Diagnosis Date Noted  .  Osteoporosis 11/05/2016  . Medicare annual wellness visit, subsequent 09/26/2014  . Hypothyroidism 07/01/2013  . Glaucoma 11/02/2012  . Essential hypertension 11/02/2012  . Vitamin D deficiency 11/02/2012  . Hyperlipidemia, mixed 07/24/2011  . Obesity (BMI 30-39.9) 07/24/2011  . hx: breast cancer, IDC (Microinvasive) w DCIS, 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 - 07/10/2011    [name] 11/13/2016, 8:41 AM  [facility] [address] [place], [place], [address] Phone: [phone]   Fax:  [phone]  Name: DAMIYA GUIZA MRN: [id number] Date of Birth: 08/06/1941

## 2016-11-14 NOTE — Telephone Encounter (Signed)
Close Encounter 

## 2016-11-16 ENCOUNTER — Ambulatory Visit: Payer: Medicare Other | Admitting: Physical Therapy

## 2016-11-16 DIAGNOSIS — M25611 Stiffness of right shoulder, not elsewhere classified: Secondary | ICD-10-CM | POA: Diagnosis not present

## 2016-11-16 DIAGNOSIS — M6281 Muscle weakness (generalized): Secondary | ICD-10-CM

## 2016-11-16 DIAGNOSIS — M25511 Pain in right shoulder: Secondary | ICD-10-CM

## 2016-11-16 NOTE — Therapy (Signed)
[facility] [address], [place], [place], [address] Phone: [phone]   Fax:  [phone]  Physical Therapy Treatment  Patient Details  Name: [name] MRN: [id number] Date of Birth: 15-Jun-1941 Referring Provider: Kathryne Hitch, MD  Encounter Date: 11/16/2016      PT End of Session - 11/16/16 0815    Visit Number 41   Number of Visits 49   Date for PT Re-Evaluation 12/12/16   Authorization Type G-codes at 76   PT Start Time 0800   PT Stop Time 0840   PT Time Calculation (min) 40 min   Activity Tolerance Patient tolerated treatment well      Past Medical History:  Diagnosis Date  . Abnormal thyroid function test 07/01/2013  . Arthritis   . Cancer ([place])   . Dermatitis 11/25/2013  . Dyslipidemia   . Glaucoma 11/02/2012  . HTN (hypertension) 11/02/2012  . 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 Hyperlipidemia   . Obesity     Past Surgical History:  Procedure Laterality Date  . AUGMENTATION MAMMAPLASTY Left   . BREAST SURGERY  10/2005   Left Mastectomy, reconstruction, reduction on right. Dr. Harlow Fowler  . CRYOTHERAPY     GYN for CIN III  . DILATION AND CURETTAGE OF UTERUS    . EXCISION MORTON'S NEUROMA     right foot  . MASTECTOMY Left   . REDUCTION MAMMAPLASTY Right     There were no vitals filed for this visit.      Subjective Assessment - 11/16/16 0759    Subjective The doctor was pleased with my progress and he said I did not need surgery.  He said to finish PT next week and he released me from going back to see him.     Currently in Pain? No/denies   Pain Score 0-No pain   Pain Location Shoulder   Pain Orientation Right   Pain Type Chronic pain                         [facility] Adult PT Treatment/Exercise - 11/16/16 0001      Therapeutic Activites    Other Therapeutic Activities reaching, carrying, dressing tasks     Elbow  Exercises   Elbow Flexion Strengthening;Right;20 reps  green band     Shoulder Exercises: Supine   External Rotation --  2# 3x 30 sec stretch   Flexion AROM;Right;5 reps   Shoulder Flexion Weight (lbs) 2  cuff weight   Other Supine Exercises shoulder sweeping 10x  with 2# weight on     Shoulder Exercises: Sidelying   Internal Rotation AROM;Strengthening;5 reps   Internal Rotation Weight (lbs) 2   ABduction AROM;Right;5 reps   ABduction Weight (lbs) 2     Shoulder Exercises: Standing   Extension Strengthening;Right;20 reps;Theraband   Theraband Level (Shoulder Extension) Level 3 (Green)   Row Strengthening;Both;20 reps;Theraband   Theraband Level (Shoulder Row) Level 3 (Green)   Other Standing Exercises Level 20 on wall flexion 2 min; circles 2 min   Other Standing Exercises green band D1/D2 diagonal extensions 20x each     Moist Heat Therapy   Number Minutes Moist Heat 10 Minutes   Moist Heat Location Shoulder  with exercises in supine                  PT Short Term Goals - 11/16/16 3559  PT SHORT TERM GOAL #1   Title be independent in initial HEP   Status Achieved     PT SHORT TERM GOAL #2   Title demonstrate Rt shoulder A/ROM flexion to 100 degrees to improve overhead reaching   Status Achieved     PT SHORT TERM GOAL #3   Title demonstrate 50 degrees P/ROM Rt shoulder ER to improve use of Rt UE   Status Achieved     PT SHORT TERM GOAL #4   Title report < or = to 3/10 Rt shoulder pain with movement   Status Achieved           PT Long Term Goals - 11/16/16 4098      PT LONG TERM GOAL #1   Title be independent in advanced HEP   Time 8   Period Weeks   Status On-going     PT LONG TERM GOAL #2   Title reduce FOTO to < or = to 36% limitation   Status Achieved     PT LONG TERM GOAL #3   Title demonstrate Rt shoulder A/ROM flexion to 120 degrees to improve overhead use   Status Achieved     PT LONG TERM GOAL #4   Title improve strength  and endurance to  report < or = to 90% use of Rt UE with ADLs and self-care   Status Achieved     PT LONG TERM GOAL #5   Title demonstrate > or = to 4/5 Rt shoulder strength thoughout to improve use with home tasks and safe driving   Time 8   Period Weeks   Status On-going     PT LONG TERM GOAL #6   Title report  <or = to 2/10 Rt shoulder pain with use with home tasks and self-care   Status Achieved     PT LONG TERM GOAL #7   Title demonstrate Rt shoulder A/ROM IR to L3 to improve self care   Time 8   Period Weeks   Status On-going               Plan - 11/16/16 0816    Clinical Impression Statement The patient states Dr. Percell Fowler was very pleased with her progress considering the severity of her 4 part  humeral fracture.  She is now at the 6 month post injury.  She has done well with focus on low load long duration  stretching.  Will continue to instruct in progressive HEP for further improvements in ROM and strength.  Should meet remaining goals by the end of next week.     PT Next Visit Plan continue 2 more visits  for progressive ROM to overhead reaching, horizontal adduction and abduction progressive HEP establishment;  moist heat as needed with soft tissue and joint mobility exs      Patient will benefit from skilled therapeutic intervention in order to improve the following deficits and impairments:     Visit Diagnosis: Stiffness of right shoulder, not elsewhere classified  Muscle weakness (generalized)  Acute pain of right shoulder     Problem List Patient Active Problem List   Diagnosis Date Noted  . Osteoporosis 11/05/2016  . Medicare annual wellness visit, subsequent 09/26/2014  . Hypothyroidism 07/01/2013  . Glaucoma 11/02/2012  . Essential hypertension 11/02/2012  . Vitamin D deficiency 11/02/2012  . Hyperlipidemia, mixed 07/24/2011  . Obesity (BMI 30-39.9) 07/24/2011  . hx: breast cancer, IDC (Microinvasive) w DCIS, 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 - 07/10/2011  [name], PT 11/16/16 8:38 AM Phone: [phone] Fax: [phone]  [name] 11/16/2016, 8:36 AM  [facility] [address], [place], [place], [address] Phone: [phone]   Fax:  [phone]  Name: [name] MRN: [id number] Date of Birth: 1941/02/25

## 2016-11-19 ENCOUNTER — Other Ambulatory Visit: Payer: Medicare Other

## 2016-11-20 ENCOUNTER — Encounter: Payer: Medicare Other | Admitting: Physical Therapy

## 2016-11-22 ENCOUNTER — Ambulatory Visit: Payer: Medicare Other

## 2016-11-22 DIAGNOSIS — M25511 Pain in right shoulder: Secondary | ICD-10-CM

## 2016-11-22 DIAGNOSIS — M6281 Muscle weakness (generalized): Secondary | ICD-10-CM

## 2016-11-22 DIAGNOSIS — M25611 Stiffness of right shoulder, not elsewhere classified: Secondary | ICD-10-CM | POA: Diagnosis not present

## 2016-11-22 NOTE — Therapy (Signed)
[facility] [address], [place], [place], [address] Phone: [phone]   Fax:  [phone]  Physical Therapy Treatment  Patient Details  Name: [name] MRN: [id number] Date of Birth: 1941-03-06 Referring Provider: Kathryne Hitch, MD  Encounter Date: 11/22/2016      PT End of Session - 11/22/16 0926    Visit Number 60   PT Start Time 0903   PT Stop Time 0929   PT Time Calculation (min) 26 min   Activity Tolerance Patient tolerated treatment well   Behavior During Therapy [facility] for tasks assessed/performed      Past Medical History:  Diagnosis Date  . Abnormal thyroid function test 07/01/2013  . Arthritis   . Cancer ([place])   . Dermatitis 11/25/2013  . Dyslipidemia   . Glaucoma 11/02/2012  . HTN (hypertension) 11/02/2012  . 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 Hyperlipidemia   . Obesity     Past Surgical History:  Procedure Laterality Date  . AUGMENTATION MAMMAPLASTY Left   . BREAST SURGERY  10/2005   Left Mastectomy, reconstruction, reduction on right. Dr. Harlow Mares  . CRYOTHERAPY     GYN for CIN III  . DILATION AND CURETTAGE OF UTERUS    . EXCISION MORTON'S NEUROMA     right foot  . MASTECTOMY Left   . REDUCTION MAMMAPLASTY Right     There were no vitals filed for this visit.      Subjective Assessment - 11/22/16 0904    Subjective Pt is ready for D/C today.     Currently in Pain? No/denies            [facility] PT Assessment - 11/22/16 0001      Assessment   Medical Diagnosis s/p Rt proximal humerus fracture     Observation/Other Assessments   Focus on Therapeutic Outcomes (FOTO)  31% limitation     AROM   Right Shoulder Flexion 140 Degrees   Right Shoulder ABduction 116 Degrees  scapular elevation   Right Shoulder Internal Rotation --  Rt buttock   Right Shoulder External Rotation --  to Rt upper trap     Strength   Right Shoulder  Flexion 4+/5   Right Shoulder ABduction 4/5   Right Shoulder Internal Rotation 4+/5   Right Shoulder External Rotation 4/5                     [facility] Adult PT Treatment/Exercise - 11/22/16 0001      Shoulder Exercises: Seated   Flexion Strengthening;Right;20 reps     Shoulder Exercises: Standing   Flexion Strengthening;Both;20 reps   Shoulder Flexion Weight (lbs) 20   Extension Strengthening;Both  3x10   Extension Weight (lbs) 20#   Row Strengthening;Both;Weights  3x10   Row Weight (lbs) 20#   Other Standing Exercises upper extremity ranger: Level 20 on wall flexion 2 min; circles 2 min   Other Standing Exercises overhead with beach ball flexion and diagonals x10 each                  PT Short Term Goals - 11/16/16 2878      PT SHORT TERM GOAL #1   Title be independent in initial HEP   Status Achieved     PT SHORT TERM GOAL #2   Title demonstrate Rt shoulder A/ROM flexion to 100 degrees to improve overhead reaching   Status Achieved  PT SHORT TERM GOAL #3   Title demonstrate 50 degrees P/ROM Rt shoulder ER to improve use of Rt UE   Status Achieved     PT SHORT TERM GOAL #4   Title report < or = to 3/10 Rt shoulder pain with movement   Status Achieved           PT Long Term Goals - 25-Nov-2016 0909      PT LONG TERM GOAL #1   Title be independent in advanced HEP   Status Achieved     PT LONG TERM GOAL #2   Title reduce FOTO to < or = to 36% limitation   Status Achieved     PT LONG TERM GOAL #3   Title demonstrate Rt shoulder A/ROM flexion to 120 degrees to improve overhead use   Status Achieved     PT LONG TERM GOAL #4   Title improve strength and endurance to  report < or = to 90% use of Rt UE with ADLs and self-care   Status Achieved     PT LONG TERM GOAL #5   Title demonstrate > or = to 4/5 Rt shoulder strength thoughout to improve use with home tasks and safe driving   Status Achieved     PT LONG TERM GOAL #6   Title  report  <or = to 2/10 Rt shoulder pain with use with home tasks and self-care   Status Achieved     PT LONG TERM GOAL #7   Title demonstrate Rt shoulder A/ROM IR to L3 to improve self care   Status Not Met               Plan - 11/25/2016 0910    Clinical Impression Statement Pt is ready for D/C to HEP.  Pt has comprehensive HEP for Rt shoulder flexiblity and strength.  Pt with limited Rt UE A/ROM s/p Rt humerus fracture and will continue with HE for continued gains.      PT Next Visit Plan D/C PT to HEP   Consulted and Agree with Plan of Care Patient      Patient will benefit from skilled therapeutic intervention in order to improve the following deficits and impairments:     Visit Diagnosis: Stiffness of right shoulder, not elsewhere classified  Muscle weakness (generalized)  Acute pain of right shoulder       G-Codes - November 25, 2016 0909    Functional Assessment Tool Used (Outpatient Only) FOTO; clinical impression   Functional Limitation Other PT primary   Other PT Primary Goal Status (E7035) At least 20 percent but less than 40 percent impaired, limited or restricted   Other PT Primary Discharge Status (K0938) At least 20 percent but less than 40 percent impaired, limited or restricted      Problem List Patient Active Problem List   Diagnosis Date Noted  . Osteoporosis 11/05/2016  . Medicare annual wellness visit, subsequent 09/26/2014  . Hypothyroidism 07/01/2013  . Glaucoma 11/02/2012  . Essential hypertension 11/02/2012  . Vitamin D deficiency 11/02/2012  . Hyperlipidemia, mixed 07/24/2011  . Obesity (BMI 30-39.9) 07/24/2011  . hx: breast cancer, IDC (Microinvasive) w DCIS, 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 - 07/10/2011  PHYSICAL THERAPY DISCHARGE SUMMARY  Visits from Start of Care: 42  Current functional level related to goals / functional outcomes: See above for current status.   Remaining deficits: See above.  Pt has HEP in place to address remaining deficits.      Education / Equipment: HEP  Plan: Patient agrees to discharge.  Patient goals were partially met. Patient is being discharged due to being pleased with the current functional level.  ?????         Sigurd Sos, PT 11/22/16 9:31 AM  [facility] [address] [place], [place], [address] Phone: [phone]   Fax:  [phone]  Name: [name] MRN: [id number] Date of Birth: [birth year]

## 2016-11-23 ENCOUNTER — Encounter: Payer: Self-pay | Admitting: Family Medicine

## 2016-11-23 ENCOUNTER — Other Ambulatory Visit (HOSPITAL_COMMUNITY)
Admission: RE | Admit: 2016-11-23 | Discharge: 2016-11-23 | Disposition: A | Discharge status: Home / Self Care | Payer: Medicare Other | Source: Ambulatory Visit | Attending: Family Medicine | Admitting: Family Medicine

## 2016-11-23 ENCOUNTER — Ambulatory Visit (INDEPENDENT_AMBULATORY_CARE_PROVIDER_SITE_OTHER): Payer: Medicare Other | Admitting: Family Medicine

## 2016-11-23 VITALS — BP 150/78 | HR 100 | Temp 98.0°F | Ht 62.25 in | Wt 179.6 lb

## 2016-11-23 DIAGNOSIS — I10 Essential (primary) hypertension: Secondary | ICD-10-CM | POA: Diagnosis not present

## 2016-11-23 DIAGNOSIS — E782 Mixed hyperlipidemia: Secondary | ICD-10-CM

## 2016-11-23 DIAGNOSIS — Z0000 Encounter for general adult medical examination without abnormal findings: Secondary | ICD-10-CM | POA: Diagnosis not present

## 2016-11-23 DIAGNOSIS — E039 Hypothyroidism, unspecified: Secondary | ICD-10-CM | POA: Diagnosis not present

## 2016-11-23 DIAGNOSIS — E559 Vitamin D deficiency, unspecified: Secondary | ICD-10-CM | POA: Diagnosis not present

## 2016-11-23 DIAGNOSIS — Z87898 Personal history of other specified conditions: Secondary | ICD-10-CM

## 2016-11-23 DIAGNOSIS — Z01419 Encounter for gynecological examination (general) (routine) without abnormal findings: Secondary | ICD-10-CM | POA: Insufficient documentation

## 2016-11-23 DIAGNOSIS — Z8742 Personal history of other diseases of the female genital tract: Secondary | ICD-10-CM

## 2016-11-23 DIAGNOSIS — M810 Age-related osteoporosis without current pathological fracture: Secondary | ICD-10-CM

## 2016-11-23 NOTE — Assessment & Plan Note (Signed)
Vitamin D- was low recently- followed by Dr. Layne Benton and replacement through their office. She was told take 10k vitD per day until follow up.

## 2016-11-23 NOTE — Patient Instructions (Addendum)
6 month follow up for blood pressure and weight check. Great job checking at home! Would love to see at least 10 lbs off by follow up. Happy to see you sooner if needed for encouragement  I would follow up with Dr. Layne Benton about prolia- I think this is a reasonable idea and about 7% risk of affecting cholesterol   Ms. Niesen , Thank you for taking time to come for your Medicare Wellness Visit. I appreciate your ongoing commitment to your health goals. Please review the following plan we discussed and let me know if I can assist you in the future.   Will get hearing check for right [facility] in [place]  These are the goals we discussed: Goals    . Weight (lb) < 200 lb (90.7 kg)          Will go back to weight watchers Will start exercising with spouse for weight bearing Will buddy up with your friend        This is a list of the screening recommended for you and due dates:  Health Maintenance  Topic Date Due  . Pap Smear  12/10/2015  . Colon Cancer Screening  06/09/2020  . Tetanus Vaccine  07/17/2020  . Flu Shot  Completed  . DEXA scan (bone density measurement)  Completed  . Pneumonia vaccines  Completed   Prevention of falls: Remove rugs or any tripping hazards in the home Use Non slip mats in bathtubs and showers Placing grab bars next to the toilet and or shower Placing handrails on both sides of the stair way Adding extra lighting in the home.   Personal safety issues reviewed:  1. Consider starting a community watch program per [facility] 2.  Changes batteries is smoke detector and/or carbon monoxide detector  3.  If you have firearms; keep them in a safe place 4.  Wear protection when in the sun; Always wear sunscreen or a hat; It is good to have your doctor check your skin annually or review any new areas of concern 5. Driving safety; Keep in the right lane; stay 3 car lengths behind the car in front of you on the highway; look 3 times prior  to pulling out; carry your cell phone everywhere you go!    Learn about the Yellow Dot program:  The program allows first responders at your emergency to have access to who your physician is, as well as your medications and medical conditions.  Citizens requesting the Yellow Dot Packages should contact Master Corporal Nunzio Cobbs at the [facility] [phone] for the first week of the program and beginning the week after Easter citizens should contact their Scientist, physiological.       Health Maintenance for Postmenopausal Women Menopause is a normal process in which your reproductive ability comes to an end. This process happens gradually over a span of months to years, usually between the ages of 49 and 84. Menopause is complete when you have missed 12 consecutive menstrual periods. It is important to talk with your health care provider about some of the most common conditions that affect postmenopausal women, such as heart disease, cancer, and bone loss (osteoporosis). Adopting a healthy lifestyle and getting preventive care can help to promote your health and wellness. Those actions can also lower your chances of developing some of these common conditions. What should I know about menopause? During menopause, you may experience a number of symptoms, such  as:  Moderate-to-severe hot flashes.  Night sweats.  Decrease in sex drive.  Mood swings.  Headaches.  Tiredness.  Irritability.  Memory problems.  Insomnia. Choosing to treat or not to treat menopausal changes is an individual decision that you make with your health care provider. What should I know about hormone replacement therapy and supplements? Hormone therapy products are effective for treating symptoms that are associated with menopause, such as hot flashes and night sweats. Hormone replacement carries certain risks, especially as you become older. If you are thinking about using  estrogen or estrogen with progestin treatments, discuss the benefits and risks with your health care provider. What should I know about heart disease and stroke? Heart disease, heart attack, and stroke become more likely as you age. This may be due, in part, to the hormonal changes that your body experiences during menopause. These can affect how your body processes dietary fats, triglycerides, and cholesterol. Heart attack and stroke are both medical emergencies. There are many things that you can do to help prevent heart disease and stroke:  Have your blood pressure checked at least every 1-2 years. High blood pressure causes heart disease and increases the risk of stroke.  If you are 2-37 years old, ask your health care provider if you should take aspirin to prevent a heart attack or a stroke.  Do not use any tobacco products, including cigarettes, chewing tobacco, or electronic cigarettes. If you need help quitting, ask your health care provider.  It is important to eat a healthy diet and maintain a healthy weight.  Be sure to include plenty of vegetables, fruits, low-fat dairy products, and lean protein.  Avoid eating foods that are high in solid fats, added sugars, or salt (sodium).  Get regular exercise. This is one of the most important things that you can do for your health.  Try to exercise for at least 150 minutes each week. The type of exercise that you do should increase your heart rate and make you sweat. This is known as moderate-intensity exercise.  Try to do strengthening exercises at least twice each week. Do these in addition to the moderate-intensity exercise.  Know your numbers.Ask your health care provider to check your cholesterol and your blood glucose. Continue to have your blood tested as directed by your health care provider. What should I know about cancer screening? There are several types of cancer. Take the following steps to reduce your risk and to catch any  cancer development as early as possible. Breast Cancer  Practice breast self-awareness.  This means understanding how your breasts normally appear and feel.  It also means doing regular breast self-exams. Let your health care provider know about any changes, no matter how small.  If you are 50 or older, have a clinician do a breast exam (clinical breast exam or CBE) every year. Depending on your age, family history, and medical history, it may be recommended that you also have a yearly breast X-ray (mammogram).  If you have a family history of breast cancer, talk with your health care provider about genetic screening.  If you are at high risk for breast cancer, talk with your health care provider about having an MRI and a mammogram every year.  Breast cancer (BRCA) gene test is recommended for women who have family members with BRCA-related cancers. Results of the assessment will determine the need for genetic counseling and BRCA1 and for BRCA2 testing. BRCA-related cancers include these types:  Breast. This occurs in  males or females.  Ovarian.  Tubal. This may also be called fallopian tube cancer.  Cancer of the abdominal or pelvic lining (peritoneal cancer).  Prostate.  Pancreatic. Cervical, Uterine, and Ovarian Cancer  Your health care provider may recommend that you be screened regularly for cancer of the pelvic organs. These include your ovaries, uterus, and vagina. This screening involves a pelvic exam, which includes checking for microscopic changes to the surface of your cervix (Pap test).  For women ages 21-65, health care providers may recommend a pelvic exam and a Pap test every three years. For women ages 45-65, they may recommend the Pap test and pelvic exam, combined with testing for human papilloma virus (HPV), every five years. Some types of HPV increase your risk of cervical cancer. Testing for HPV may also be done on women of any age who have unclear Pap test  results.  Other health care providers may not recommend any screening for nonpregnant women who are considered low risk for pelvic cancer and have no symptoms. Ask your health care provider if a screening pelvic exam is right for you.  If you have had past treatment for cervical cancer or a condition that could lead to cancer, you need Pap tests and screening for cancer for at least 20 years after your treatment. If Pap tests have been discontinued for you, your risk factors (such as having a new sexual partner) need to be reassessed to determine if you should start having screenings again. Some women have medical problems that increase the chance of getting cervical cancer. In these cases, your health care provider may recommend that you have screening and Pap tests more often.  If you have a family history of uterine cancer or ovarian cancer, talk with your health care provider about genetic screening.  If you have vaginal bleeding after reaching menopause, tell your health care provider.  There are currently no reliable tests available to screen for ovarian cancer. Lung Cancer  Lung cancer screening is recommended for adults 39-56 years old who are at high risk for lung cancer because of a history of smoking. A yearly low-dose CT scan of the lungs is recommended if you:  Currently smoke.  Have a history of at least 30 pack-years of smoking and you currently smoke or have quit within the past 15 years. A pack-year is smoking an average of one pack of cigarettes per day for one year. Yearly screening should:  Continue until it has been 15 years since you quit.  Stop if you develop a health problem that would prevent you from having lung cancer treatment. Colorectal Cancer  This type of cancer can be detected and can often be prevented.  Routine colorectal cancer screening usually begins at age 32 and continues through age 60.  If you have risk factors for colon cancer, your health care  provider may recommend that you be screened at an earlier age.  If you have a family history of colorectal cancer, talk with your health care provider about genetic screening.  Your health care provider may also recommend using home test kits to check for hidden blood in your stool.  A small camera at the end of a tube can be used to examine your colon directly (sigmoidoscopy or colonoscopy). This is done to check for the earliest forms of colorectal cancer.  Direct examination of the colon should be repeated every 5-10 years until age 14. However, if early forms of precancerous polyps or small growths are  found or if you have a family history or genetic risk for colorectal cancer, you may need to be screened more often. Skin Cancer  Check your skin from head to toe regularly.  Monitor any moles. Be sure to tell your health care provider:  About any new moles or changes in moles, especially if there is a change in a mole's shape or color.  If you have a mole that is larger than the size of a pencil eraser.  If any of your family members has a history of skin cancer, especially at a young age, talk with your health care provider about genetic screening.  Always use sunscreen. Apply sunscreen liberally and repeatedly throughout the day.  Whenever you are outside, protect yourself by wearing long sleeves, pants, a wide-brimmed hat, and sunglasses. What should I know about osteoporosis? Osteoporosis is a condition in which bone destruction happens more quickly than new bone creation. After menopause, you may be at an increased risk for osteoporosis. To help prevent osteoporosis or the bone fractures that can happen because of osteoporosis, the following is recommended:  If you are 60-83 years old, get at least 1,000 mg of calcium and at least 600 mg of vitamin D per day.  If you are older than age 74 but younger than age 74, get at least 1,200 mg of calcium and at least 600 mg of vitamin D  per day.  If you are older than age 58, get at least 1,200 mg of calcium and at least 800 mg of vitamin D per day. Smoking and excessive alcohol intake increase the risk of osteoporosis. Eat foods that are rich in calcium and vitamin D, and do weight-bearing exercises several times each week as directed by your health care provider. What should I know about how menopause affects my mental health? Depression may occur at any age, but it is more common as you become older. Common symptoms of depression include:  Low or sad mood.  Changes in sleep patterns.  Changes in appetite or eating patterns.  Feeling an overall lack of motivation or enjoyment of activities that you previously enjoyed.  Frequent crying spells. Talk with your health care provider if you think that you are experiencing depression. What should I know about immunizations? It is important that you get and maintain your immunizations. These include:  Tetanus, diphtheria, and pertussis (Tdap) booster vaccine.  Influenza every year before the flu season begins.  Pneumonia vaccine.  Shingles vaccine. Your health care provider may also recommend other immunizations. This information is not intended to replace advice given to you by your health care provider. Make sure you discuss any questions you have with your health care provider. Document Released: 10/19/2005 Document Revised: 03/16/2016 Document Reviewed: 05/31/2015 Elsevier Interactive Patient Education  2017 [employer].   Fall Prevention in the Home Falls can cause injuries and can affect people from all age groups. There are many simple things that you can do to make your home safe and to help prevent falls. What can I do on the outside of my home?  Regularly repair the edges of walkways and driveways and fix any cracks.  Remove high doorway thresholds.  Trim any shrubbery on the main path into your home.  Use bright outdoor lighting.  Clear walkways  of debris and clutter, including tools and rocks.  Regularly check that handrails are securely fastened and in good repair. Both sides of any steps should have handrails.  Install guardrails along the edges  of any raised decks or porches.  Have leaves, snow, and ice cleared regularly.  Use sand or salt on walkways during winter months.  In the garage, clean up any spills right away, including grease or oil spills. What can I do in the bathroom?  Use night lights.  Install grab bars by the toilet and in the tub and shower. Do not use towel bars as grab bars.  Use non-skid mats or decals on the floor of the tub or shower.  If you need to sit down while you are in the shower, use a plastic, non-slip stool.  Keep the floor dry. Immediately clean up any water that spills on the floor.  Remove soap buildup in the tub or shower on a regular basis.  Attach bath mats securely with double-sided non-slip rug tape.  Remove throw rugs and other tripping hazards from the floor. What can I do in the bedroom?  Use night lights.  Make sure that a bedside light is easy to reach.  Do not use oversized bedding that drapes onto the floor.  Have a firm chair that has side arms to use for getting dressed.  Remove throw rugs and other tripping hazards from the floor. What can I do in the kitchen?  Clean up any spills right away.  Avoid walking on wet floors.  Place frequently used items in easy-to-reach places.  If you need to reach for something above you, use a sturdy step stool that has a grab bar.  Keep electrical cables out of the way.  Do not use floor polish or wax that makes floors slippery. If you have to use wax, make sure that it is non-skid floor wax.  Remove throw rugs and other tripping hazards from the floor. What can I do in the stairways?  Do not leave any items on the stairs.  Make sure that there are handrails on both sides of the stairs. Fix handrails that are  broken or loose. Make sure that handrails are as long as the stairways.  Check any carpeting to make sure that it is firmly attached to the stairs. Fix any carpet that is loose or worn.  Avoid having throw rugs at the top or bottom of stairways, or secure the rugs with carpet tape to prevent them from moving.  Make sure that you have a light switch at the top of the stairs and the bottom of the stairs. If you do not have them, have them installed. What are some other fall prevention tips?  Wear closed-toe shoes that fit well and support your feet. Wear shoes that have rubber soles or low heels.  When you use a stepladder, make sure that it is completely opened and that the sides are firmly locked. Have someone hold the ladder while you are using it. Do not climb a closed stepladder.  Add color or contrast paint or tape to grab bars and handrails in your home. Place contrasting color strips on the first and last steps.  Use mobility aids as needed, such as canes, walkers, scooters, and crutches.  Turn on lights if it is dark. Replace any light bulbs that burn out.  Set up furniture so that there are clear paths. Keep the furniture in the same spot.  Fix any uneven floor surfaces.  Choose a carpet design that does not hide the edge of steps of a stairway.  Be aware of any and all pets.  Review your medicines with your healthcare provider. Some  medicines can cause dizziness or changes in blood pressure, which increase your risk of falling. Talk with your health care provider about other ways that you can decrease your risk of falls. This may include working with a physical therapist or trainer to improve your strength, balance, and endurance. This information is not intended to replace advice given to you by your health care provider. Make sure you discuss any questions you have with your health care provider. Document Released: 08/17/2002 Document Revised: 01/24/2016 Document Reviewed:  10/01/2014 Elsevier Interactive Patient Education  2017 [employer].

## 2016-11-23 NOTE — Assessment & Plan Note (Signed)
HLD- in study through cardiology - taking one pill a day

## 2016-11-23 NOTE — Progress Notes (Signed)
Pre visit review using our clinic review tool, if applicable. No additional management support is needed unless otherwise documented below in the visit note. 

## 2016-11-23 NOTE — Progress Notes (Signed)
Phone: [phone]  Subjective:  Patient presents today for their annual physical. Chief complaint-noted.   See problem oriented charting- ROS- full  review of systems was completed and negative except for some musle aches, some swelling in feet.   The following were reviewed and entered/updated in epic: Past Medical History:  Diagnosis Date  . Abnormal thyroid function test 07/01/2013  . Arthritis   . Cancer ([place])   . Dermatitis 11/25/2013  . Dyslipidemia   . Glaucoma 11/02/2012  . HTN (hypertension) 11/02/2012  . 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 Hyperlipidemia   . Obesity    Patient Active Problem List   Diagnosis Date Noted  . Osteoporosis 11/05/2016    Priority: High  . Hypothyroidism 07/01/2013    Priority: Medium  . Essential hypertension 11/02/2012    Priority: Medium  . Hyperlipidemia, mixed 07/24/2011    Priority: Medium  . 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 Medicare annual wellness visit, subsequent 09/26/2014    Priority: Low  . Glaucoma 11/02/2012    Priority: Low  . Vitamin D deficiency 11/02/2012    Priority: Low  . Obesity (BMI 30-39.9) 07/24/2011    Priority: Low   Past Surgical History:  Procedure Laterality Date  . AUGMENTATION MAMMAPLASTY Left   . BREAST SURGERY  10/2005   Left Mastectomy, reconstruction, reduction on right. Dr. Harlow Mares  . CRYOTHERAPY     GYN for CIN III  . DILATION AND CURETTAGE OF UTERUS    . EXCISION MORTON'S NEUROMA     right foot  . MASTECTOMY Left   . REDUCTION MAMMAPLASTY Right     Family History  Problem Relation Age of Onset  . Pneumonia Mother   . Hyperlipidemia Mother   . Parkinsonism Mother   . Other Father     bilateral subdural hematoma  . Colon cancer Paternal Grandmother   . Hyperlipidemia Sister   . Prostate cancer Neg Hx   . Breast cancer Neg Hx   . Heart disease Neg Hx   . Diabetes Neg  Hx     Medications- reviewed and updated Current Outpatient Prescriptions  Medication Sig Dispense Refill  . AMBULATORY NON FORMULARY MEDICATION Take 180 mg by mouth daily. Medication Name: 180 mg bempedoic acid vs a placebo, CLEAR Research Study drug provided    . amLODipine (NORVASC) 5 MG tablet Take 1 tablet (5 mg total) by mouth daily. 90 tablet 3  . B Complex Vitamins (B COMPLEX 100 PO) Take 100 mg by mouth daily.    . Bimatoprost (LUMIGAN OP) Apply to eye as directed.    . calcium citrate (CALCITRATE - DOSED IN MG ELEMENTAL CALCIUM) 950 MG tablet Take 200 mg of elemental calcium by mouth daily.    . Cholecalciferol (VITAMIN D-3) 5000 UNITS TABS Take 5,000 mg by mouth daily.    . Coenzyme Q10 (COQ10) 200 MG CAPS Take 1 capsule by mouth daily.    . dorzolamide-timolol (COSOPT) 22.3-6.8 MG/ML ophthalmic solution Place 1 drop into the right eye 2 (two) times daily.    Javier Docker Oil 1000 MG CAPS Take by mouth. Mega Red    . levothyroxine (SYNTHROID, LEVOTHROID) 25 MCG tablet Take 1 tablet (25 mcg total) by mouth daily before breakfast. 30 tablet 3  . Omega-3 Fatty Acids (OMEGA III EPA+DHA PO) Take by mouth.    Vladimir Faster Glycol-Propyl Glycol (SYSTANE OP)  Apply to eye as directed.    . Probiotic Product (PROBIOTIC DAILY) CAPS Take by mouth daily. Reported on 10/10/2015     No current facility-administered medications for this visit.     Allergies-reviewed and updated Allergies  Allergen Reactions  . Livalo [Pitavastatin]     ENTERED IN ERROR  . Rosuvastatin Other (See Comments)    Joint & muscle aches, sleep issues.  . Adhesive [Tape] Other (See Comments)    Causes skin to turn red where touched    Social History   Social History  . Marital status: Married    Spouse name: N/A  . Number of children: N/A  . Years of education: N/A   Social History Main Topics  . Smoking status: Never Smoker  . Smokeless tobacco: Never Used  . Alcohol use Yes     Comment: social, rare  .  Drug use: No  . Sexual activity: Not Asked   Other Topics Concern  . None   Social History Narrative   Married 1981. 1 daughter, 2 step sons. 4 grandchildren      Oncologist      Hobbies: going out to eat, activities with church including cancer support group at church    Objective: BP (!) 150/78   Pulse 100   Temp 98 F (36.7 C) (Oral)   Ht 5' 2.25" (1.581 m)   Wt 179 lb 9.6 oz (81.5 kg)   SpO2 96%   BMI 32.59 kg/m  Gen: NAD, resting comfortably HEENT: Mucous membranes are moist. Oropharynx normal Neck: no thyromegaly CV: RRR no murmurs rubs or gallops Lungs: CTAB no crackles, wheeze, rhonchi Abdomen: soft/nontender/nondistended/normal bowel sounds. No rebound or guarding.  Ext: no edema Skin: warm, dry Neuro: grossly normal, moves all extremities, PERRLA  Assessment/Plan:  76 y.o. female presenting for annual physical.  Health Maintenance counseling: 1. Anticipatory guidance: Patient counseled regarding regular dental exams q 3 months, eye exams -sees Dr. Katy Fitch 3x a year for gluacoma, wearing seatbelts.  2. Risk factor reduction:  Advised patient of need for regular exercise and diet rich and fruits and vegetables to reduce risk of heart attack and stroke. Exercise- finished PT yesterday and is going to start her exercise back (released from Dr Percell Miller- saw Dr. Cyd Silence yesterday). Diet-going to go to weight watchers. Stressed due to health of husband.  Wt Readings from Last 3 Encounters:  11/23/16 179 lb 9.6 oz (81.5 kg)  11/01/16 176 lb 9.6 oz (80.1 kg)  06/14/16 171 lb 9.6 oz (77.8 kg)  3. Immunizations/screenings/ancillary studies Immunization History  Administered Date(s) Administered  . Influenza Split 07/24/2011  . Influenza Whole 06/15/2009  . Influenza, High Dose Seasonal PF 06/29/2013, 07/05/2015, 05/10/2016  . Influenza,inj,Quad PF,36+ Mos 06/21/2014, 06/14/2016  . Pneumococcal Conjugate-13 04/15/2014  . Pneumococcal Polysaccharide-23  07/17/2000, 10/02/2016  . Tdap 07/17/2010  4. Cervical cancer screening- abnormal paps in her 40s really wants repeat today- LGSIL at that time and later cryotherapy and conization. Discussed if this is normal and she has had all normals= strongly consider stopping 5. Breast cancer history 2006 s/p left mastectomy-  breast exam declined and mammogram 10/31/16 6. Colon cancer screening - 2011- has their # will call back. Thought she needed 5-10 year repeat.  7. Skin cancer screening- saw dermatology regularly in past- states no changes in skin   Status of chronic or acute concerns   Essential hypertension HTN- controlled on amlodipine 5mg  at home- poor control in office- will trust home values . Home #  s before medicine averaged 141/82. Now averaging 136/80. More swelling on 5mg  than 2.5mg - wants to go back down.   Hypothyroidism Hypothyroidism- suspect controlled on levothyroxine 25 mcg. 10/16/16 TSH through lipid clinic was normal- not abstracted in  Hyperlipidemia, mixed HLD- in study through cardiology - taking one pill a day  Vitamin D deficiency Vitamin D- was low recently- followed by Dr. Layne Benton and replacement through their office. She was told take 10k vitD per day until follow up.   Osteoporosis Osteoporosis- thinking about prolia through [facility]. Discussed 7% risk of increasing cholesterol as a risk.    Return in about 6 months (around 05/26/2017) for follow up- or sooner if needed.  Pap under history of abnormal and preventative healthcare- we discussed uncertain if this would be covered by insurance- she is aware  Return precautions advised.   Garret Reddish, MD

## 2016-11-23 NOTE — Assessment & Plan Note (Addendum)
HTN- controlled on amlodipine 5mg  at home- poor control in office- will trust home values . Home #s before medicine averaged 141/82. Now averaging 136/80. More swelling on 5mg  than 2.5mg - wants to go back down.

## 2016-11-23 NOTE — Progress Notes (Addendum)
Subjective:   [name] is a 76 y.o. female who presents for Medicare Annual (Subsequent) preventive examination.  HRA assessment completed during this visit with Ms. Mcginty The Patient was informed that the wellness visit is to identify future health risk and educate and initiate measures that can reduce risk for increased disease through the lifespan.    NO ROS; Medicare Wellness Visit Describes health as good, fair or great; states health is good  Op rehab for right shoulder; not much improved but still inhibits some movement with self care  Osteoporosis - good understanding and has discussed with Dr. 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  Does the finances  Is very active in church which gives her joy and stress relief   Support Living situation;  Has a dtr but also learned during her recent fx of her shoulder that her neighbor was extremely helpful to her Was able to drive her and take her to apt.    Medical Hx; htn; hyperlipidemia  Meds: reviewed by Dr. Yong Channel   Primary Prevention Tobacco never smoked  ETOH - just socially  Diet (cho 303) LDL 220; HDL 52 and trig 154  Problems snacking often  Breakfast;  Loves cinnamon raisin bread Weight watchers now; Has a really good friend who will back in April Hope to start exercising and losing weight  BMI 32  Exercise  Exercise with spouse as he has fitness coach from the church coming to the home  Will continue to use the stationary bike   Dental; having dental work completed    Hearing Screening   125Hz  250Hz  500Hz  1000Hz  2000Hz  3000Hz  4000Hz  6000Hz  8000Hz   Right ear:    100       Left ear:     100      Comments: Right ear 1000   Left 2000hz     Discussed hearing check [employer]   Vision Screening Comments: Has glaucoma in right eye Takes 3 different drops Reports this is stable now   Stressors - care giving role with increasing needs due to the spouse having a debilitating disease. Overall coping well.   Safety Fall hx; fall and fx right shoulder  Broken in 4 places  Not sure how she fell;  Bad structure to shoulder  Has been in PT  Had good neighbors and they transported her   Fear of falling only one fall with fx to right shoulder now resolving Given education on "Fall Prevention in the Home" for more safety tips the patient can apply as appropriate.   Given information on Community safety;  Has redone the bathroom - bars inside the shower Can get in tub  Has bar at back door  In the front has railing  Has been staying downstairs during recovery   driving safety,   sun protection, not out in the sun   firearm safety reviewed if firearms exist  smoke detectors yes Security system in place  Driving accidents no   Do you have little interest or pleasure in doing things? no Have you been feeling down, depressed, hopeless?  no PHQ9 waived or completed    Ad8 score reviewed for issues;  Issues making decisions; no  Less interest in hobbies / activities" no  Repeats questions, stories; family complaining: NO  Trouble using ordinary gadgets; microwave; computer: no  Forgets the month or year: no  Mismanaging finances: no  Missing apt: no but does write them down  Daily problems with thinking of memory NO Ad8 score is 0  MMSE not appropriate unless AD8 score is > 2   Advanced Directive yes  Health Maintenance Due  Topic Date Due  . PAP SMEAR  12/10/2015   Discussed getting a pap smear at her age;  Dr. Yong Channel to fup as needed.  Will postpone until next office visit   Colonoscopy 05/2010 Mammogram 10/2016 11 years mastectomy left;  Dexa 10/2016 -2.7; To dr. Layne Benton - she is going to see if  she can take prolia    Assessment:  Appropriate referrals made or health recommendations as appropriate based on individual needs and choices;     Current Care Team reviewed and updated    Cardiac Risk Factors include: dyslipidemia;hypertension     Objective:     Vitals: BP (!) 150/78   Pulse 100   Temp 98 F (36.7 C) (Oral)   Ht 5' 2.25" (1.581 m)   Wt 179 lb 9.6 oz (81.5 kg)   SpO2 96%   BMI 32.59 kg/m   Body mass index is 32.59 kg/m.   Tobacco History  Smoking Status  . Never Smoker  Smokeless Tobacco  . Never Used     Counseling given: Yes   Past Medical History:  Diagnosis Date  . Abnormal thyroid function test 07/01/2013  . Arthritis   . Cancer ([place])   . Dermatitis 11/25/2013  . Dyslipidemia   . Glaucoma 11/02/2012  . HTN (hypertension) 11/02/2012  . 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 Hyperlipidemia   . Obesity    Past Surgical History:  Procedure Laterality Date  . AUGMENTATION MAMMAPLASTY Left   . BREAST SURGERY  10/2005   Left Mastectomy, reconstruction, reduction on right. Dr. Harlow Mares  . CRYOTHERAPY     GYN for CIN III  . DILATION AND CURETTAGE OF UTERUS    . EXCISION MORTON'S NEUROMA     right foot  . MASTECTOMY Left   . REDUCTION MAMMAPLASTY Right    Family History  Problem Relation Age of Onset  . Pneumonia Mother   . Hyperlipidemia Mother   . Parkinsonism Mother   . Other Father     bilateral subdural hematoma  . Colon cancer Paternal Grandmother   . Hyperlipidemia Sister   . Prostate cancer Neg Hx   . Breast cancer Neg Hx   . Heart disease Neg Hx   . Diabetes Neg Hx    History  Sexual Activity  . Sexual activity: Not on file    Outpatient Encounter Prescriptions as of 11/23/2016  Medication Sig  . AMBULATORY NON FORMULARY MEDICATION Take 180 mg by mouth daily. Medication Name: 180 mg bempedoic acid vs a placebo, CLEAR Research Study drug provided  . amLODipine (NORVASC) 5 MG  tablet Take 1 tablet (5 mg total) by mouth daily.  . B Complex Vitamins (B COMPLEX 100 PO) Take 100 mg by mouth daily.  . Bimatoprost (LUMIGAN OP) Apply to eye as directed.  . calcium citrate (CALCITRATE - DOSED IN MG ELEMENTAL CALCIUM) 950 MG tablet Take 200 mg of elemental calcium by mouth daily.  . Cholecalciferol (VITAMIN D-3) 5000 UNITS TABS Take 5,000 mg by mouth daily.  . Coenzyme Q10 (COQ10) 200 MG CAPS Take 1  capsule by mouth daily.  . dorzolamide-timolol (COSOPT) 22.3-6.8 MG/ML ophthalmic solution Place 1 drop into the right eye 2 (two) times daily.  Javier Docker Oil 1000 MG CAPS Take by mouth. Mega Red  . levothyroxine (SYNTHROID, LEVOTHROID) 25 MCG tablet Take 1 tablet (25 mcg total) by mouth daily before breakfast.  . Omega-3 Fatty Acids (OMEGA III EPA+DHA PO) Take by mouth.  Vladimir Faster Glycol-Propyl Glycol (SYSTANE OP) Apply to eye as directed.  . Probiotic Product (PROBIOTIC DAILY) CAPS Take by mouth daily. Reported on 10/10/2015  . [DISCONTINUED] cromolyn (OPTICROM) 4 % ophthalmic solution Place 1 drop into the right eye 4 (four) times daily.   No facility-administered encounter medications on file as of 11/23/2016.     Activities of Daily Living In your present state of health, do you have any difficulty performing the following activities: 11/23/2016  Hearing? N  Vision? N  Difficulty concentrating or making decisions? N  Walking or climbing stairs? N  Dressing or bathing? N  Doing errands, shopping? N  Preparing Food and eating ? N  Using the Toilet? N  In the past six months, have you accidently leaked urine? N  Do you have problems with loss of bowel control? N  Managing your Medications? N  Managing your Finances? N  Housekeeping or managing your Housekeeping? N  Some recent data might be hidden    Patient Care Team: Marin Olp, MD as PCP - General (Family Medicine) Chauncey Cruel, MD (Hematology and Oncology) Neldon Mc, MD (General Surgery) Crissie Reese, MD (Plastic Surgery) Clent Jacks, MD as Consulting Physician (Ophthalmology)    Assessment:     Exercise Activities and Dietary recommendations Current Exercise Habits: Structured exercise class;Home exercise routine, Type of exercise: strength training/weights, Time (Minutes): 30, Intensity: Moderate  Goals    . Weight (lb) < 200 lb (90.7 kg)          Will go back to weight watchers Will start exercising with spouse for weight bearing Will buddy up with your friend       Fall Risk Fall Risk  11/23/2016 11/01/2016 10/10/2015 09/21/2014 09/21/2014  Falls in the past year? - Yes No No No  Number falls in past yr: 1 1 - - -  Injury with Fall? - Yes - - -  Follow up Education provided - - - -   Depression Screen PHQ 2/9 Scores 11/23/2016 11/01/2016 10/10/2015 09/21/2014  PHQ - 2 Score 0 0 0 0     Cognitive Function MMSE - Mini Mental State Exam 11/23/2016  Not completed: (No Data)   Independent; functioning without difficulty, mood stable and engaged as appropriate today       Immunization History  Administered Date(s) Administered  . Influenza Split 07/24/2011  . Influenza Whole 06/15/2009  . Influenza, High Dose Seasonal PF 06/29/2013, 07/05/2015, 05/10/2016  . Influenza,inj,Quad PF,36+ Mos 06/21/2014, 06/14/2016  . Pneumococcal Conjugate-13 04/15/2014  . Pneumococcal Polysaccharide-23 07/17/2000, 10/02/2016  . Tdap 07/17/2010   Screening Tests Health Maintenance  Topic Date Due  . PAP SMEAR  12/10/2015  . COLONOSCOPY  06/09/2020  . TETANUS/TDAP  07/17/2020  . INFLUENZA VACCINE  Completed  . DEXA SCAN  Completed  . PNA vac Low Risk Adult  Completed      Plan:     Discussed caregiver role and long term care plan  Has dicussed Pap with Dr. Yong Channel and will defer to Dr. Yong Channel. In the interim, will postpone as last pap test were ok.  May go for hearing test  Dr. Hal Morales considering prolia; checking on insurance Discussed weight bearing exercise   During  the course of the visit the patient was educated and counseled about the following appropriate screening and preventive services:   Vaccines to include Pneumoccal, Influenza, Hepatitis B, Td, Zostavax, HCV  Electrocardiogram  Cardiovascular Disease  Colorectal cancer screening  Bone density screening ostoeporosis  Diabetes screening   Glaucoma screening  Mammography/PAP  Nutrition counseling   Patient Instructions (the written plan) was given to the patient.   WERXV,QMGQQ, RN  11/23/2016  I have reviewed and agree with note, evaluation, plan. Pap had been done today per patient request.   Garret Reddish, MD

## 2016-11-23 NOTE — Assessment & Plan Note (Signed)
Hypothyroidism- suspect controlled on levothyroxine 25 mcg. 10/16/16 TSH through lipid clinic was normal- not abstracted in

## 2016-11-23 NOTE — Assessment & Plan Note (Signed)
Osteoporosis- thinking about prolia through [facility]. Discussed 7% risk of increasing cholesterol as a risk.

## 2016-11-23 NOTE — Addendum Note (Signed)
Addended by: Lyndle Herrlich on: 11/23/2016 08:56 AM   Modules accepted: Orders

## 2016-11-27 LAB — CYTOLOGY - PAP: Diagnosis: NEGATIVE

## 2016-12-06 ENCOUNTER — Other Ambulatory Visit: Payer: Self-pay | Admitting: Family Medicine

## 2017-01-03 ENCOUNTER — Telehealth: Payer: Self-pay | Admitting: Family Medicine

## 2017-01-03 NOTE — Telephone Encounter (Signed)
The patient would like to have the shingles vaccine sent to:    [place], [place] [phone] (Phone) [phone] (Fax)    She also was wondering what she should do about her swollen legs and feet. She stated that you changed her blood pressure medication to 5mg  and her bp went down but now her feet and legs are swelling. The paper that came with the medication stated for the patient to contact the doctor if there is any swelling in legs and feet.

## 2017-01-25 ENCOUNTER — Other Ambulatory Visit: Payer: Self-pay | Admitting: Family Medicine

## 2017-01-28 ENCOUNTER — Other Ambulatory Visit: Payer: Self-pay

## 2017-01-28 ENCOUNTER — Telehealth: Payer: Self-pay

## 2017-01-28 MED ORDER — CHLORTHALIDONE 25 MG PO TABS: 12.5000 mg | ORAL_TABLET | Freq: Every day | ORAL | 3 refills | Status: DC

## 2017-01-28 NOTE — Telephone Encounter (Signed)
May stop amlodipine  Start chlorthalidone 25 mg (only take half tablet) PO daily #30 with 3 refills. Have her see me 2-3 weeks after change in office.

## 2017-01-28 NOTE — Telephone Encounter (Signed)
atient came in to the office. Discussed changes. Med sent in to the pharmacy and I scheduled her for 3 week follow up

## 2017-01-28 NOTE — Telephone Encounter (Signed)
Monica, RN with [facility] was at pt's home today for health assessment visit. She states that pt is having issues with increased BP even while on medication. Reading today was 148/80. She also reports pt is having side effects from Amlodipine: peripheral pitting edema, fatigue, flushing. Pt would like to know if medication can be changed.  Please call pt at home with recommendations.   Dr. Yong Channel - Please advise. Thanks!

## 2017-01-28 NOTE — Telephone Encounter (Signed)
Called and left a voicemail message asking for a return phone call 

## 2017-02-18 ENCOUNTER — Encounter: Payer: Self-pay | Admitting: Family Medicine

## 2017-02-18 ENCOUNTER — Ambulatory Visit (INDEPENDENT_AMBULATORY_CARE_PROVIDER_SITE_OTHER): Payer: Medicare Other | Admitting: Family Medicine

## 2017-02-18 DIAGNOSIS — F439 Reaction to severe stress, unspecified: Secondary | ICD-10-CM

## 2017-02-18 DIAGNOSIS — I10 Essential (primary) hypertension: Secondary | ICD-10-CM

## 2017-02-18 DIAGNOSIS — F4321 Adjustment disorder with depressed mood: Secondary | ICD-10-CM | POA: Insufficient documentation

## 2017-02-18 NOTE — Progress Notes (Signed)
Subjective:  [name] is a 76 y.o. year old very pleasant female patient who presents for/with See problem oriented charting ROS- No chest pain or shortness of breath. No headache or blurry vision.    Past Medical History-  Patient Active Problem List   Diagnosis Date Noted  . Osteoporosis 11/05/2016    Priority: High  . Hypothyroidism 07/01/2013    Priority: Medium  . Essential hypertension 11/02/2012    Priority: Medium  . Hyperlipidemia, mixed 07/24/2011    Priority: Medium  . 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 Medicare annual wellness visit, subsequent 09/26/2014    Priority: Low  . Glaucoma 11/02/2012    Priority: Low  . Vitamin D deficiency 11/02/2012    Priority: Low  . Obesity (BMI 30-39.9) 07/24/2011    Priority: Low  . Stress at home 02/18/2017   Medications- reviewed and updated Current Outpatient Prescriptions  Medication Sig Dispense Refill  . AMBULATORY NON FORMULARY MEDICATION Take 180 mg by mouth daily. Medication Name: 180 mg bempedoic acid vs a placebo, CLEAR Research Study drug provided    . B Complex Vitamins (B COMPLEX 100 PO) Take 100 mg by mouth daily.    . Bimatoprost (LUMIGAN OP) Apply to eye as directed.    . calcium citrate (CALCITRATE - DOSED IN MG ELEMENTAL CALCIUM) 950 MG tablet Take 200 mg of elemental calcium by mouth daily.    . chlorthalidone (HYGROTON) 25 MG tablet Take 0.5 tablets (12.5 mg total) by mouth daily. 30 tablet 3  . Cholecalciferol (VITAMIN D-3) 5000 UNITS TABS Take 5,000 mg by mouth daily.    . Coenzyme Q10 (COQ10) 200 MG CAPS Take 1 capsule by mouth daily.    . dorzolamide-timolol (COSOPT) 22.3-6.8 MG/ML ophthalmic solution Place 1 drop into the right eye 2 (two) times daily.    Javier Docker Oil 1000 MG CAPS Take by mouth. Mega Red    . levothyroxine (SYNTHROID, LEVOTHROID) 25 MCG tablet TAKE 1 TABLET IN THE MORNING BEFORE BREAKFAST. 90 tablet 1  . Omega-3 Fatty  Acids (OMEGA III EPA+DHA PO) Take by mouth.    Vladimir Faster Glycol-Propyl Glycol (SYSTANE OP) Apply to eye as directed.    . Probiotic Product (PROBIOTIC DAILY) CAPS Take by mouth daily. Reported on 10/10/2015     No current facility-administered medications for this visit.     Objective: BP (!) 148/86 (BP Location: Left Arm, Patient Position: Sitting, Cuff Size: Large)   Pulse (!) 106   Temp 98.1 F (36.7 C) (Oral)   Ht 5' 2.25" (1.581 m)   Wt 177 lb (80.3 kg)   SpO2 96%   BMI 32.11 kg/m  Gen: NAD, resting comfortably CV: RRR no murmurs rubs or gallops. HR on my exam 92.  Lungs: CTAB no crackles, wheeze, rhonchi Abdomen: obese Ext: no edema Skin: warm, dry  Assessment/Plan:  Essential hypertension S: controlled on chlorthalidone 12.5mg  (half tablet of 25mg ) (amlodipine even at 2.5mg  caused edema) per home readings.  Last visit averaging 136/80. This visit over about 3 weeks on chlorthalidone averaging 128/78 at home. Pulse at home at 86 during same time frame.  ASCVD 10 year risk calculation if age 38-79: statin myalgia BP Readings from Last 3 Encounters:  02/18/17 (!) 148/86  11/23/16 (!) 150/78  11/01/16 (!) 152/86  A/P: We discussed blood pressure goal of <140/90 ideal but can tolerate up to 150 per [facility]. Continue current meds as  at [facility] goal in office and even better control at home  Stress at home S: patient husband dealing with depression but unwilling to take medication. Mainly sits and watches tv and eats A/P: extended counseling provided to patient in dealing with this stressor. She is going to consider behavioral health - handout given  3-6 months  The duration of face-to-face time during this visit was greater than 15 minutes. Greater than 50% of this time was spent in counseling, explanation of diagnosis, planning of further management, and/or coordination of care including discussion about home stressors.    Return precautions advised.  Garret Reddish,  MD

## 2017-02-18 NOTE — Assessment & Plan Note (Signed)
S: patient husband dealing with depression but unwilling to take medication. Mainly sits and watches tv and eats A/P: extended counseling provided to patient in dealing with this stressor. She is going to consider behavioral health - handout given

## 2017-02-18 NOTE — Assessment & Plan Note (Signed)
S: controlled on chlorthalidone 12.5mg  (half tablet of 25mg ) (amlodipine even at 2.5mg  caused edema) per home readings.  Last visit averaging 136/80. This visit over about 3 weeks on chlorthalidone averaging 128/78 at home. Pulse at home at 86 during same time frame.  ASCVD 10 year risk calculation if age 76-79: statin myalgia BP Readings from Last 3 Encounters:  02/18/17 (!) 148/86  11/23/16 (!) 150/78  11/01/16 (!) 152/86  A/P: We discussed blood pressure goal of <140/90 ideal but can tolerate up to 150 per [facility]. Continue current meds as at [facility] goal in office and even better control at home

## 2017-02-18 NOTE — Patient Instructions (Signed)
Blood pressure looks great. Keep checking 2-3x a week if continues to look this good  I would strongly consider calling one of our psychologists or counselors   See me in 3-6 months to check in.

## 2017-02-27 ENCOUNTER — Encounter: Payer: Self-pay | Admitting: *Deleted

## 2017-02-27 DIAGNOSIS — Z006 Encounter for examination for normal comparison and control in clinical research program: Secondary | ICD-10-CM

## 2017-02-27 NOTE — Progress Notes (Signed)
Subject to Research clinic for T3M3 visit for the Clear Research Study.  No c/o, no aes or saes to report per subject.  Has lost 3 lbs since seeing [place] last and is concentrating on a good diet and exercise.  3 months study medication issued.  Subject re-consented to [place] version 4.1 [id number] Local [phone]

## 2017-05-08 ENCOUNTER — Telehealth: Payer: Self-pay | Admitting: Family Medicine

## 2017-05-08 NOTE — Telephone Encounter (Signed)
Pt made appt with [facility] for visit and Labs 05/21/17 she has orders for labs from [facility], Dr . Wandra Feinstein wants CMP CBC HIV antibody Vitamin D. Pt will bring order with her to appt and hope hunter puts orders in here for her to get labs that day.

## 2017-05-21 ENCOUNTER — Other Ambulatory Visit: Payer: Medicare Other

## 2017-05-21 ENCOUNTER — Ambulatory Visit (INDEPENDENT_AMBULATORY_CARE_PROVIDER_SITE_OTHER): Payer: Medicare Other | Admitting: Family Medicine

## 2017-05-21 ENCOUNTER — Encounter: Payer: Self-pay | Admitting: Family Medicine

## 2017-05-21 VITALS — BP 150/76 | HR 94 | Temp 98.0°F | Wt 174.2 lb

## 2017-05-21 DIAGNOSIS — I10 Essential (primary) hypertension: Secondary | ICD-10-CM | POA: Diagnosis not present

## 2017-05-21 DIAGNOSIS — E039 Hypothyroidism, unspecified: Secondary | ICD-10-CM

## 2017-05-21 DIAGNOSIS — Z23 Encounter for immunization: Secondary | ICD-10-CM

## 2017-05-21 DIAGNOSIS — E559 Vitamin D deficiency, unspecified: Secondary | ICD-10-CM | POA: Diagnosis not present

## 2017-05-21 DIAGNOSIS — R5383 Other fatigue: Secondary | ICD-10-CM | POA: Diagnosis not present

## 2017-05-21 DIAGNOSIS — F439 Reaction to severe stress, unspecified: Secondary | ICD-10-CM

## 2017-05-21 DIAGNOSIS — M810 Age-related osteoporosis without current pathological fracture: Secondary | ICD-10-CM

## 2017-05-21 DIAGNOSIS — E782 Mixed hyperlipidemia: Secondary | ICD-10-CM

## 2017-05-21 LAB — CBC WITH DIFFERENTIAL/PLATELET
Basophils Absolute: 0.1 10*3/uL (ref 0.0–0.1)
Basophils Relative: 1.5 % (ref 0.0–3.0)
Eosinophils Absolute: 0.2 10*3/uL (ref 0.0–0.7)
Eosinophils Relative: 2.8 % (ref 0.0–5.0)
HCT: 40.5 % (ref 36.0–46.0)
Hemoglobin: 13.3 g/dL (ref 12.0–15.0)
Lymphocytes Relative: 30.2 % (ref 12.0–46.0)
Lymphs Abs: 1.8 10*3/uL (ref 0.7–4.0)
MCHC: 32.9 g/dL (ref 30.0–36.0)
MCV: 91 fl (ref 78.0–100.0)
Monocytes Absolute: 0.5 10*3/uL (ref 0.1–1.0)
Monocytes Relative: 7.6 % (ref 3.0–12.0)
Neutro Abs: 3.5 10*3/uL (ref 1.4–7.7)
Neutrophils Relative %: 57.9 % (ref 43.0–77.0)
Platelets: 287 10*3/uL (ref 150.0–400.0)
RBC: 4.46 Mil/uL (ref 3.87–5.11)
RDW: 12.8 % (ref 11.5–15.5)
WBC: 6 10*3/uL (ref 4.0–10.5)

## 2017-05-21 LAB — COMPREHENSIVE METABOLIC PANEL
ALT: 29 U/L (ref 0–35)
AST: 21 U/L (ref 0–37)
Albumin: 4.6 g/dL (ref 3.5–5.2)
Alkaline Phosphatase: 37 U/L — ABNORMAL LOW (ref 39–117)
BUN: 19 mg/dL (ref 6–23)
CO2: 29 mEq/L (ref 19–32)
Calcium: 9.7 mg/dL (ref 8.4–10.5)
Chloride: 103 mEq/L (ref 96–112)
Creatinine, Ser: 0.75 mg/dL (ref 0.40–1.20)
GFR: 79.9 mL/min (ref >60.00)
Glucose, Bld: 84 mg/dL (ref 70–99)
Potassium: 4.1 mEq/L (ref 3.5–5.1)
Sodium: 142 mEq/L (ref 135–145)
Total Bilirubin: 0.7 mg/dL (ref 0.2–1.2)
Total Protein: 6.5 g/dL (ref 6.0–8.3)

## 2017-05-21 LAB — VITAMIN D 25 HYDROXY (VIT D DEFICIENCY, FRACTURES): VITD: 20.23 ng/mL — ABNORMAL LOW (ref 30.00–100.00)

## 2017-05-21 LAB — TSH: TSH: 4.54 u[IU]/mL — ABNORMAL HIGH (ref 0.35–4.50)

## 2017-05-21 NOTE — Assessment & Plan Note (Signed)
S: controlled on  chlorthalidone 12.5mg  pre home readings 125/76 with HR 87. In office last few visits controlled per JNC8  BP Readings from Last 3 Encounters:  05/21/17 (!) 150/76  02/27/17 (!) 130/56  02/18/17 (!) 148/86  A/P: We discussed blood pressure goal of <140/90. Continue current meds:  With excellent home control. High in office though right at [facility] goal

## 2017-05-21 NOTE — Assessment & Plan Note (Signed)
Dr. Layne Benton managing. She asks for labs on vitamin D, cbc, cmp- these were ordered today. Will plan to forward to Dr. Layne Benton.

## 2017-05-21 NOTE — Assessment & Plan Note (Signed)
In trial about cholesterol- not on statin due to myalgias. Has had labs within a year so we opted not to repeat- but healthy eating, regular exercise could certainly help.

## 2017-05-21 NOTE — Patient Instructions (Addendum)
Please stop by lab before you go  Blood pressure looks great at home  High dose flu shot today

## 2017-05-21 NOTE — Assessment & Plan Note (Signed)
S: Lab Results  Component Value Date   TSH 4.78 (H) 05/03/2016   On thyroid medication-levothyroxine 25 mcg A/P: update tsh- largely asymptomatic but if still elevated TSH consider increasing to 50 mcg levothyroxine

## 2017-05-21 NOTE — Assessment & Plan Note (Signed)
husband with depression but not wanting to take meds. Stress dealing with this. He is getting less and less active- may end up in a nursing  facility

## 2017-05-21 NOTE — Progress Notes (Signed)
Subjective:  [name] is a 76 y.o. year old very pleasant female patient who presents for/with See problem oriented charting ROS- No chest pain or shortness of breath. No headache or blurry vision.    Past Medical History-  Patient Active Problem List   Diagnosis Date Noted  . Osteoporosis 11/05/2016    Priority: High  . Hypothyroidism 07/01/2013    Priority: Medium  . Essential hypertension 11/02/2012    Priority: Medium  . Hyperlipidemia, mixed 07/24/2011    Priority: Medium  . 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 Medicare annual wellness visit, subsequent 09/26/2014    Priority: Low  . Glaucoma 11/02/2012    Priority: Low  . Vitamin D deficiency 11/02/2012    Priority: Low  . Obesity (BMI 30-39.9) 07/24/2011    Priority: Low  . Stress at home 02/18/2017    Medications- reviewed and updated Current Outpatient Prescriptions  Medication Sig Dispense Refill  . AMBULATORY NON FORMULARY MEDICATION Take 180 mg by mouth daily. Medication Name: 180 mg bempedoic acid vs a placebo, CLEAR Research Study drug provided    . B Complex Vitamins (B COMPLEX 100 PO) Take 100 mg by mouth daily.    . Bimatoprost (LUMIGAN OP) Apply to eye as directed.    . calcium citrate (CALCITRATE - DOSED IN MG ELEMENTAL CALCIUM) 950 MG tablet Take 200 mg of elemental calcium by mouth daily.    . chlorthalidone (HYGROTON) 25 MG tablet Take 0.5 tablets (12.5 mg total) by mouth daily. 30 tablet 3  . Cholecalciferol (VITAMIN D-3) 5000 UNITS TABS Take 5,000 mg by mouth daily.    . Coenzyme Q10 (COQ10) 200 MG CAPS Take 1 capsule by mouth daily.    . dorzolamide-timolol (COSOPT) 22.3-6.8 MG/ML ophthalmic solution Place 1 drop into the right eye 2 (two) times daily.    Javier Docker Oil 1000 MG CAPS Take by mouth. Mega Red    . levothyroxine (SYNTHROID, LEVOTHROID) 25 MCG tablet TAKE 1 TABLET IN THE MORNING BEFORE BREAKFAST. 90 tablet 1  . Omega-3  Fatty Acids (OMEGA III EPA+DHA PO) Take by mouth.    Vladimir Faster Glycol-Propyl Glycol (SYSTANE OP) Apply to eye as directed.    . Probiotic Product (PROBIOTIC DAILY) CAPS Take by mouth daily. Reported on 10/10/2015     No current facility-administered medications for this visit.     Objective: BP (!) 150/76   Pulse 94   Temp 98 F (36.7 C) (Oral)   Wt 174 lb 3.2 oz (79 kg)   SpO2 94%   BMI 31.61 kg/m  Gen: NAD, resting comfortably CV: RRR no murmurs rubs or gallops Lungs: CTAB no crackles, wheeze, rhonchi Abdomen: soft/nontender/nondistended/normal bowel sounds. No rebound or guarding. obese Ext: trace edema Skin: warm, dry Neuro: normal gait  Assessment/Plan:  Essential hypertension S: controlled on  chlorthalidone 12.5mg  pre home readings 125/76 with HR 87. In office last few visits controlled per JNC8  BP Readings from Last 3 Encounters:  05/21/17 (!) 150/76  02/27/17 (!) 130/56  02/18/17 (!) 148/86  A/P: We discussed blood pressure goal of <140/90. Continue current meds:  With excellent home control. High in office though right at [facility] goal  Hypothyroidism S: Lab Results  Component Value Date   TSH 4.78 (H) 05/03/2016   On thyroid medication-levothyroxine 25 mcg A/P: update tsh- largely asymptomatic but if still elevated TSH consider increasing to 50 mcg levothyroxine  Osteoporosis Dr. Layne Benton managing. She asks for labs on vitamin D, cbc, cmp- these were ordered today. Will plan to forward to Dr. Layne Benton.   Hyperlipidemia, mixed In trial about cholesterol- not on statin due to myalgias. Has had labs within a year so we opted not to repeat- but healthy eating, regular exercise could certainly help.   Stress at home husband with depression but not wanting to take meds. Stress dealing with this. He is getting less and less active- may end up in a nursing  facility   Future Appointments Date Time Provider [place]  05/30/2017 8:00 AM [place] [facility] None    Orders Placed This Encounter  Procedures  . Flu vaccine HIGH DOSE PF  . CBC with Differential/Platelet    Standing Status:   Future    Number of Occurrences:   1    Standing Expiration Date:   05/21/2018  . Comprehensive metabolic panel    [place]    Standing Status:   Future    Number of Occurrences:   1    Standing Expiration Date:   05/21/2018  . TSH    Standing Status:   Future    Number of Occurrences:   1    Standing Expiration Date:   05/21/2018  . VITAMIN D 25 Hydroxy (Vit-D Deficiency, Fractures)    Standing Status:   Future    Number of Occurrences:   1    Standing Expiration Date:   05/21/2018   Return precautions advised. Acutely planned visit due to labs needed- may return to prior schedule for visits- at least q6 months.  Garret Reddish, MD

## 2017-05-28 ENCOUNTER — Other Ambulatory Visit: Payer: Self-pay

## 2017-05-28 DIAGNOSIS — E039 Hypothyroidism, unspecified: Secondary | ICD-10-CM

## 2017-05-28 MED ORDER — LEVOTHYROXINE SODIUM 50 MCG PO TABS: 50.0000 ug | ORAL_TABLET | Freq: Every day | ORAL | 3 refills | Status: DC

## 2017-05-30 NOTE — Telephone Encounter (Signed)
Patient has never returned phone calls. Records will be shredded.

## 2017-05-30 NOTE — Progress Notes (Unsigned)
Mrs. Manus completed her Month 6 Follow-Up visit for the [place]. Concomitant medications were reviewed, adverse events assessed and clinical safety labs were drawn. There were no adverse events reported. Study drug compliance was 84%. BP 169/87, 139/74 Pulse 86 Study drug was dispensed per protocol. Appointment was made for her next clinic visit and subject was reminded of the telephone follow-up in 3 Months.  Blossom Hoops, RN, Research Coordinator [phone]

## 2017-08-29 ENCOUNTER — Telehealth: Payer: Self-pay | Admitting: *Deleted

## 2017-08-29 NOTE — Telephone Encounter (Signed)
Spoke with subject re: the Clear Research trial. No c/o, aes or saes to report. Next clinic visit confirmed for November 27, 2017 @0900 .

## 2017-11-20 DIAGNOSIS — H401190 Primary open-angle glaucoma, unspecified eye, stage unspecified: Secondary | ICD-10-CM | POA: Insufficient documentation

## 2017-11-20 DIAGNOSIS — Z961 Presence of intraocular lens: Secondary | ICD-10-CM | POA: Insufficient documentation

## 2017-12-26 ENCOUNTER — Other Ambulatory Visit: Payer: Self-pay | Admitting: Family Medicine

## 2017-12-26 DIAGNOSIS — Z139 Encounter for screening, unspecified: Secondary | ICD-10-CM

## 2018-01-14 ENCOUNTER — Telehealth: Payer: Self-pay | Admitting: Family Medicine

## 2018-01-14 NOTE — Telephone Encounter (Signed)
See note

## 2018-01-14 NOTE — Telephone Encounter (Signed)
Copied from [place] [id number]. Topic: General - Other >> Jan 14, 2018 11:59 AM Synthia Innocent wrote: Reason for CRM: Requesting Blood work for bone injections, states Roselyn Reef was to get back with her, she has not heard anything.

## 2018-01-14 NOTE — Telephone Encounter (Signed)
Called and spoke with patient whom I scheduled for a Prolia injection next week

## 2018-01-23 ENCOUNTER — Telehealth: Payer: Self-pay

## 2018-01-23 ENCOUNTER — Ambulatory Visit (INDEPENDENT_AMBULATORY_CARE_PROVIDER_SITE_OTHER): Payer: Medicare Other

## 2018-01-23 DIAGNOSIS — M810 Age-related osteoporosis without current pathological fracture: Secondary | ICD-10-CM | POA: Diagnosis not present

## 2018-01-23 MED ORDER — DENOSUMAB 60 MG/ML ~~LOC~~ SOSY
60.0000 mg | PREFILLED_SYRINGE | Freq: Once | SUBCUTANEOUS | Status: AC
  Administered 2018-01-23: 60 mg via SUBCUTANEOUS

## 2018-01-23 NOTE — Progress Notes (Signed)
Per orders of Dr.Hunter, injection of Prolia  given by Francella Solian. Patient tolerated injection well. Patient did have questions on weather she needed to continue the Vitamin D. I informed her I would send message to Dr. Yong Channel to get instructions and someone would call her with that information.

## 2018-01-23 NOTE — Telephone Encounter (Signed)
Have her change to 2000 units of vitamin D daily over-the-counter.  We can recheck vitamin D next visit

## 2018-01-23 NOTE — Telephone Encounter (Signed)
Patient in office today for prolia she wanted to know if she needed to continue the high dose vitamin D. She will need refill if so.

## 2018-01-24 ENCOUNTER — Ambulatory Visit
Admission: RE | Admit: 2018-01-24 | Discharge: 2018-01-24 | Disposition: A | Discharge status: Home / Self Care | Payer: Medicare Other | Source: Ambulatory Visit | Attending: Family Medicine | Admitting: Family Medicine

## 2018-01-24 DIAGNOSIS — Z139 Encounter for screening, unspecified: Secondary | ICD-10-CM

## 2018-01-30 NOTE — Telephone Encounter (Signed)
Spoke to pt, told her D.r Yong Channel would like her to change Vit D to 2000 units daily you can buy OTC and he will recheck you Vitamin D level next visit. Pt verbalized understanding.

## 2018-02-06 ENCOUNTER — Other Ambulatory Visit: Payer: Self-pay | Admitting: Family Medicine

## 2018-03-06 ENCOUNTER — Encounter: Payer: Self-pay | Admitting: *Deleted

## 2018-03-06 ENCOUNTER — Encounter: Payer: Medicare Other | Admitting: Family Medicine

## 2018-03-06 ENCOUNTER — Ambulatory Visit (INDEPENDENT_AMBULATORY_CARE_PROVIDER_SITE_OTHER): Payer: Medicare Other | Admitting: *Deleted

## 2018-03-06 VITALS — BP 120/68 | HR 53 | Ht 62.0 in | Wt 163.0 lb

## 2018-03-06 DIAGNOSIS — Z0000 Encounter for general adult medical examination without abnormal findings: Secondary | ICD-10-CM | POA: Diagnosis not present

## 2018-03-06 NOTE — Patient Instructions (Addendum)
[name] , Thank you for taking time to come for your Medicare Wellness Visit. I appreciate your ongoing commitment to your health goals. Please review the following plan we discussed and let me know if I can assist you in the future.   Will discuss prolia issues with [name]  Mammogram just completed at the [facility] - [name] to call and try to get this    Recommendations for Dexa Scan Female over the age of 56 Man age 77 or older If you broke a bone past the age of 78 Women menopausal age with risk factors (thin frame; smoker; hx of fx ) Post menopausal women under the age of 53 with risk factors A man age 20 to 35 with risk factors Other: Spine xray that is showing break of bone loss Back pain with possible break Height loss of 1/2 inch or more within one year Total loss in height of 1.5 inches from your original height  Calcium 1200mg  with Vit D 800u per day; more as directed by physician Strength building exercises discussed; can include walking; housework; small weights or stretch bands; silver sneakers if [facility] to the [facility]  Please visit the osteoporosis foundation.org for up to date recommendations    These are the goals we discussed: Goals    . Patient Stated     Continue to stay active in lunch Lunch bunch     . Weight (lb) < 200 lb (90.7 kg)     Will go back to weight watchers Will start exercising with spouse for weight bearing Will buddy up with your friend        This is a list of the screening recommended for you and due dates:  Health Maintenance  Topic Date Due  . Flu Shot  04/10/2018  . Pap Smear  11/24/2018  . Tetanus Vaccine  07/17/2020  . DEXA scan (bone density measurement)  Completed  . Pneumonia vaccines  Completed   Fall Prevention in the Home Falls can cause injuries and can affect people from all age groups. There are many simple things that you can do to make your home safe and to help prevent falls. What can I do on the outside  of my home?  Regularly repair the edges of walkways and driveways and fix any cracks.  Remove high doorway thresholds.  Trim any shrubbery on the main path into your home.  Use bright outdoor lighting.  Clear walkways of debris and clutter, including tools and rocks.  Regularly check that handrails are securely fastened and in good repair. Both sides of any steps should have handrails.  Install guardrails along the edges of any raised decks or porches.  Have leaves, snow, and ice cleared regularly.  Use sand or salt on walkways during winter months.  In the garage, clean up any spills right away, including grease or oil spills. What can I do in the bathroom?  Use night lights.  Install grab bars by the toilet and in the tub and shower. Do not use towel bars as grab bars.  Use non-skid mats or decals on the floor of the tub or shower.  If you need to sit down while you are in the shower, use a plastic, non-slip stool.  Keep the floor dry. Immediately clean up any water that spills on the floor.  Remove soap buildup in the tub or shower on a regular basis.  Attach bath mats securely with double-sided non-slip rug tape.  Remove throw rugs  and other tripping hazards from the floor. What can I do in the bedroom?  Use night lights.  Make sure that a bedside light is easy to reach.  Do not use oversized bedding that drapes onto the floor.  Have a firm chair that has side arms to use for getting dressed.  Remove throw rugs and other tripping hazards from the floor. What can I do in the kitchen?  Clean up any spills right away.  Avoid walking on wet floors.  Place frequently used items in easy-to-reach places.  If you need to reach for something above you, use a sturdy step stool that has a grab bar.  Keep electrical cables out of the way.  Do not use floor polish or wax that makes floors slippery. If you have to use wax, make sure that it is non-skid floor  wax.  Remove throw rugs and other tripping hazards from the floor. What can I do in the stairways?  Do not leave any items on the stairs.  Make sure that there are handrails on both sides of the stairs. Fix handrails that are broken or loose. Make sure that handrails are as long as the stairways.  Check any carpeting to make sure that it is firmly attached to the stairs. Fix any carpet that is loose or worn.  Avoid having throw rugs at the top or bottom of stairways, or secure the rugs with carpet tape to prevent them from moving.  Make sure that you have a light switch at the top of the stairs and the bottom of the stairs. If you do not have them, have them installed. What are some other fall prevention tips?  Wear closed-toe shoes that fit well and support your feet. Wear shoes that have rubber soles or low heels.  When you use a stepladder, make sure that it is completely opened and that the sides are firmly locked. Have someone hold the ladder while you are using it. Do not climb a closed stepladder.  Add color or contrast paint or tape to grab bars and handrails in your home. Place contrasting color strips on the first and last steps.  Use mobility aids as needed, such as canes, walkers, scooters, and crutches.  Turn on lights if it is dark. Replace any light bulbs that burn out.  Set up furniture so that there are clear paths. Keep the furniture in the same spot.  Fix any uneven floor surfaces.  Choose a carpet design that does not hide the edge of steps of a stairway.  Be aware of any and all pets.  Review your medicines with your healthcare provider. Some medicines can cause dizziness or changes in blood pressure, which increase your risk of falling. Talk with your health care provider about other ways that you can decrease your risk of falls. This may include working with a physical therapist or trainer to improve your strength, balance, and endurance. This information is  not intended to replace advice given to you by your health care provider. Make sure you discuss any questions you have with your health care provider. Document Released: 08/17/2002 Document Revised: 01/24/2016 Document Reviewed: 10/01/2014 Elsevier Interactive Patient Education  2018 [employer].    Summary: Preventive Care for Adults  A healthy lifestyle and preventive care can promote health and wellness. Preventive health guidelines for adults include the following key practices.  . A routine yearly physical is a good way to check with your health care provider about your  health and preventive screening. It is a chance to share any concerns and updates on your health and to receive a thorough exam.  . Visit your dentist for a routine exam and preventive care every 6 months. Brush your teeth twice a day and floss once a day. Good oral hygiene prevents tooth decay and gum disease.  . The frequency of eye exams is based on your age, health, family medical history, use  of contact lenses, and other factors. Follow your health care provider's ecommendations for frequency of eye exams.  . Eat a healthy diet. Foods like vegetables, fruits, whole grains, low-fat dairy products, and lean protein foods contain the nutrients you need without too many calories. Decrease your intake of foods high in solid fats, added sugars, and salt. Eat the right amount of calories for you. Get information about a proper diet from your health care provider, if necessary.  . Regular physical exercise is one of the most important things you can do for your health. Most adults should get at least 150 minutes of moderate-intensity exercise (any activity that increases your heart rate and causes you to sweat) each week. In addition, most adults need muscle-strengthening exercises on 2 or more days a week.  Silver Sneakers may be a benefit available to you. To determine eligibility, you may visit the website:  www.silversneakers.com or contact program at 4103438611 Mon-Fri between 8AM-8PM.   . Maintain a healthy weight. The body mass index (BMI) is a screening tool to identify possible weight problems. It provides an estimate of body fat based on height and weight. Your health care provider can find your BMI and can help you achieve or maintain a healthy weight.   For adults 20 years and older: ? A BMI below 18.5 is considered underweight. ? A BMI of 18.5 to 24.9 is normal. ? A BMI of 27 to 28 is considered normal by the Institutes of Health  ? A BMI of 30 and above is considered obese.   . Maintain normal blood lipids and cholesterol levels by exercising and minimizing your intake of saturated fat. Eat a balanced diet with plenty of fruit and vegetables. Blood tests for lipids and cholesterol should begin at age 73 and be repeated every 5 years. If your lipid or cholesterol levels are high, you are over 50, or you are at high risk for heart disease, you may need your cholesterol levels checked more frequently. Ongoing high lipid and cholesterol levels should be treated with medicines if diet and exercise are not working.  . If you smoke, find out from your health care provider how to quit. If you do not use tobacco, please do not start.  . If you choose to drink alcohol, please do not consume more than one drink for women and 2 for men.  One drink is considered to be 12 ounces (355 mL) of beer, 5 ounces (148 mL) of wine, or 1.5 ounces (44 mL) of liquor. Moderation of alcohol intake to this level decreases your risk of breast cancer and liver damage.   . If you are 46-68 years old, ask your health care provider if you should take aspirin to prevent strokes.  . Use sunscreen. Apply sunscreen liberally and repeatedly throughout the day. You should seek shade when your shadow is shorter than you. Protect yourself by wearing long sleeves, pants, a wide-brimmed hat, and sunglasses year round,  whenever you are outdoors.  . Once a month, do a whole body skin exam,  using a mirror to look at the skin on your back. Tell your health care provider of new moles, moles that have irregular borders, moles that are larger than a pencil eraser, or moles that have changed in shape or color.  Last, if you have completed an Advanced Directive; please bring a copy and review with your physician and then we will scan to the medical record

## 2018-03-06 NOTE — Progress Notes (Signed)
I have personally reviewed the Medicare Annual Wellness Visit and agree with the assessment and plan.  Algis Greenhouse. Jerline Pain, MD 03/06/2018 4:50 PM

## 2018-03-06 NOTE — Progress Notes (Signed)
Subjective:   [name] is a 77 y.o. female who presents for Medicare Annual (Subsequent) preventive examination.  Reports health as fair Last OV 05/2017 Hx of breast cancer; 06/2011 Mastectomy on the left  Mr Grattan passed 2/20- Dr. Guerry Bruin was ob-gyn Has a good church  Married 38 years  Plans to stay in her home    Diet BMI 29  Lost 16 lbs since last fall  Chol 303 10/2016  A1c was 5.8 2018  Did not discuss diet in lieu of weight loss   Exercise Exercises x 2 a week   Dr. Ander Slade at [facility] eye in [place]  tx for glaucoma; started drops   There are no preventive care reminders to display for this patient.  Pap 2018  Colonoscopy 05/2010 - repeat in 10 years   Hx of breast cancer- mastectomy with left breast reconstruction and reduction on the right in 2006;  Mammogram 07/2013  - one was completed recently  Will call for report   Dexa 2018 osteoporosis managed by Dr. Ralene Cork- she is with Dr. Raliegh Ip and cared for her post fx  Had prolia 5/16; a couple of weeks later, she noted her right leg hurt; moved to hip  Could not walk. Had like symptoms the first time but this was worse with no 2 injection.  Has issues with Vit D levels in the past Has to take large amounts to keep it up    Cardiac Risk Factors include: advanced age (>74men, >108 women);family history of premature cardiovascular disease;hypertensioneducated regarding shingrix      Objective:     Vitals: BP 120/68   Pulse (!) 53   Ht 5\' 2"  (1.575 m)   Wt 163 lb (73.9 kg)   SpO2 96%   BMI 29.81 kg/m   Body mass index is 29.81 kg/m.  Advanced Directives 03/06/2018 11/23/2016 06/27/2016 12/10/2015 10/10/2015 09/21/2014  Does Patient Have a Medical Advance Directive? Yes Yes Yes No Yes Yes  Type of Advance Directive - - [name];Living will - [name];Living will [name];Living will  Does patient want to make changes to medical advance directive? - -  No - Patient declined - No - Patient declined No - Patient declined  Copy of [place] in Chart? - - No - copy requested - Yes Yes  Would patient like information on creating a medical advance directive? - - - No - patient declined information - -    Tobacco Social History   Tobacco Use  Smoking Status Never Smoker  Smokeless Tobacco Never Used     Counseling given: Yes   Clinical Intake:     Past Medical History:  Diagnosis Date  . Abnormal thyroid function test 07/01/2013  . Arthritis   . Cancer ([place])   . Dermatitis 11/25/2013  . Dyslipidemia   . Glaucoma 11/02/2012  . HTN (hypertension) 11/02/2012  . 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 Hyperlipidemia   . Obesity    Past Surgical History:  Procedure Laterality Date  . AUGMENTATION MAMMAPLASTY Left   . BREAST SURGERY  10/2005   Left Mastectomy, reconstruction, reduction on right. Dr. Harlow Mares  . CRYOTHERAPY     GYN for CIN III  . DILATION AND CURETTAGE OF UTERUS    . EXCISION MORTON'S NEUROMA     right foot  . MASTECTOMY Left   . REDUCTION MAMMAPLASTY Right  Family History  Problem Relation Age of Onset  . Pneumonia Mother   . Hyperlipidemia Mother   . Parkinsonism Mother   . Other Father        bilateral subdural hematoma  . Colon cancer Paternal Grandmother   . Hyperlipidemia Sister   . Prostate cancer Neg Hx   . Breast cancer Neg Hx   . Heart disease Neg Hx   . Diabetes Neg Hx    Social History   Socioeconomic History  . Marital status: Married    Spouse name: Not on file  . Number of children: Not on file  . Years of education: Not on file  . Highest education level: Not on file  Occupational History  . Not on file  Social Needs  . Financial resource strain: Not on file  . Food insecurity:    Worry: Not on file    Inability: Not on file  . Transportation needs:    Medical: Not on file    Non-medical: Not on file  Tobacco Use    . Smoking status: Never Smoker  . Smokeless tobacco: Never Used  Substance and Sexual Activity  . Alcohol use: Yes    Comment: social, rare  . Drug use: No  . Sexual activity: Not on file  Lifestyle  . Physical activity:    Days per week: Not on file    Minutes per session: Not on file  . Stress: Not on file  Relationships  . Social connections:    Talks on phone: Not on file    Gets together: Not on file    Attends religious service: Not on file    Active member of club or organization: Not on file    Attends meetings of clubs or organizations: Not on file    Relationship status: Not on file  Other Topics Concern  . Not on file  Social History Narrative   Married 1981. 1 daughter, 2 step sons. 4 grandchildren      Kindergarten teacher      Hobbies: going out to eat, activities with church including cancer support group at church    Outpatient Encounter Medications as of 03/06/2018  Medication Sig  . AMBULATORY NON FORMULARY MEDICATION Take 180 mg by mouth daily. Medication Name: 180 mg bempedoic acid vs a placebo, CLEAR Research Study drug provided  . Bimatoprost (LUMIGAN OP) Apply to eye as directed.  . chlorthalidone (HYGROTON) 25 MG tablet Take 0.5 tablets (12.5 mg total) by mouth daily.  . chlorthalidone (HYGROTON) 25 MG tablet TAKE (1/2) TABLET DAILY.  Marland Kitchen Cholecalciferol (VITAMIN D-3) 5000 UNITS TABS Take 5,000 mg by mouth daily.  . Coenzyme Q10 (COQ10) 200 MG CAPS Take 1 capsule by mouth daily.  . dorzolamide-timolol (COSOPT) 22.3-6.8 MG/ML ophthalmic solution Place 1 drop into the right eye 2 (two) times daily.  Javier Docker Oil 1000 MG CAPS Take by mouth. Mega Red  . levothyroxine (SYNTHROID, LEVOTHROID) 50 MCG tablet Take 1 tablet (50 mcg total) by mouth daily.  . Omega-3 Fatty Acids (OMEGA III EPA+DHA PO) Take by mouth.  Vladimir Faster Glycol-Propyl Glycol (SYSTANE OP) Apply to eye as directed.  . Probiotic Product (PROBIOTIC DAILY) CAPS Take by mouth daily. Reported on  10/10/2015  . B Complex Vitamins (B COMPLEX 100 PO) Take 100 mg by mouth daily.  . calcium citrate (CALCITRATE - DOSED IN MG ELEMENTAL CALCIUM) 950 MG tablet Take 200 mg of elemental calcium by mouth daily.   No facility-administered encounter medications on  file as of 03/06/2018.     Activities of Daily Living In your present state of health, do you have any difficulty performing the following activities: 03/06/2018  Hearing? N  Vision? Y  Difficulty concentrating or making decisions? N  Comment ad8 score 0   Walking or climbing stairs? N  Dressing or bathing? N  Doing errands, shopping? N  Preparing Food and eating ? N  Using the Toilet? N  Managing your Medications? N  Managing your Finances? N  Housekeeping or managing your Housekeeping? N  Some recent data might be hidden    Patient Care Team: Marin Olp, MD as PCP - General (Family Medicine) Magrinat, Virgie Dad, MD (Hematology and Oncology) Neldon Mc, MD (General Surgery) Crissie Reese, MD (Plastic Surgery) Clent Jacks, MD as Consulting Physician (Ophthalmology)    Assessment:   This is a routine wellness examination for [name].  Exercise Activities and Dietary recommendations Current Exercise Habits: Home exercise routine, Type of exercise: walking, Intensity: Moderate  Goals    . Patient Stated     Continue to stay active in lunch Lunch bunch     . Weight (lb) < 200 lb (90.7 kg)     Will go back to weight watchers Will start exercising with spouse for weight bearing Will buddy up with your friend        Fall Risk Fall Risk  03/06/2018 11/23/2016 11/01/2016 10/10/2015 09/21/2014  Falls in the past year? No - Yes No No  Number falls in past yr: - 1 1 - -  Injury with Fall? - - Yes - -  Follow up - Education provided - - -     Depression Screen PHQ 2/9 Scores 03/06/2018 11/23/2016 11/01/2016 10/10/2015  PHQ - 2 Score 0 0 0 0     Cognitive Function MMSE - Mini Mental State Exam 03/06/2018  11/23/2016  Not completed: (No Data) (No Data)   Ad8 score reviewed for issues:  Issues making decisions:  Less interest in hobbies / activities:  Repeats questions, stories (family complaining):  Trouble using ordinary gadgets (microwave, computer, phone):  Forgets the month or year:   Mismanaging finances:   Remembering appts:  Daily problems with thinking and/or memory: Ad8 score is=0          Immunization History  Administered Date(s) Administered  . Influenza Split 07/24/2011  . Influenza Whole 06/15/2009  . Influenza, High Dose Seasonal PF 06/29/2013, 07/05/2015, 05/10/2016, 05/21/2017  . Influenza,inj,Quad PF,6+ Mos 06/21/2014, 06/14/2016  . Pneumococcal Conjugate-13 04/15/2014  . Pneumococcal Polysaccharide-23 07/17/2000, 10/02/2016  . Tdap 07/17/2010      Screening Tests Health Maintenance  Topic Date Due  . INFLUENZA VACCINE  04/10/2018  . PAP SMEAR  11/24/2018  . TETANUS/TDAP  07/17/2020  . DEXA SCAN  Completed  . PNA vac Low Risk Adult  Completed       Plan:      PCP Notes   Health Maintenance Pap 2018  Colonoscopy 05/2010 - repeat in 10 years   Hx of breast cancer- mastectomy with left breast reconstruction and reduction on the right in 2006;  Mammogram 07/2013  - one was completed recently  Mammogram was in imaging and completed 01/24/2018 and was neg  Dexa 2018 osteoporosis managed by Dr. Ralene Cork- she is with Dr. Raliegh Ip and cared for her post fx humerus - started prolia  Has issues with Vit D levels in the past ; takes 5000 Has to take large amounts to keep it up Can't  tolerate calcium and reviewed calcium levels from the osteoporosis foundation site; taking in 1150 and agreed to eat 2 yogurts instead of 1  Bringing her calcium levels up to 1450 mg   Abnormal Screens  none  Referrals  none  Patient concerns; Had prolia 5/16; a couple of weeks later, she noted her right leg hurt; moved to hip  Could not walk well. Had  like symptoms the first time but this was worse with no 2 injection. To discuss with Dr. Yong Channel at her next apt   Nurse Concerns; Grieving for spouse; has great church community Denies depression; will call as needed   Next PCP apt TBS        I have personally reviewed and noted the following in the patient's chart:   . Medical and social history . Use of alcohol, tobacco or illicit drugs  . Current medications and supplements . Functional ability and status . Nutritional status . Physical activity . Advanced directives . List of other physicians . Hospitalizations, surgeries, and ER visits in previous 12 months . Vitals . Screenings to include cognitive, depression, and falls . Referrals and appointments  In addition, I have reviewed and discussed with patient certain preventive protocols, quality metrics, and best practice recommendations. A written personalized care plan for preventive services as well as general preventive health recommendations were provided to patient.     Wynetta Fines, RN  03/06/2018

## 2018-03-07 ENCOUNTER — Telehealth: Payer: Self-pay | Admitting: *Deleted

## 2018-03-07 NOTE — Telephone Encounter (Signed)
Subject contacted for [id number] in the Clear Research study.  No cos, aes or saes to report.  Next clinic visit confirmed.

## 2018-03-17 ENCOUNTER — Telehealth: Payer: Self-pay | Admitting: Family Medicine

## 2018-03-17 NOTE — Telephone Encounter (Signed)
Called patient and scheduled her an appointment for tomorrow at 3:45pm

## 2018-03-17 NOTE — Telephone Encounter (Signed)
See note.   Copied from [place] [id number]. Topic: Inquiry >> Mar 17, 2018  2:53 PM Conception Chancy, NT wrote: Reason for CRM: patient is calling and states she would like Roselyn Reef to give her a call. She states she has some constipation but she has taken prune juice. She requested that [place] call over anyone else.

## 2018-03-18 ENCOUNTER — Ambulatory Visit: Payer: Medicare Other | Admitting: Family Medicine

## 2018-03-18 ENCOUNTER — Encounter: Payer: Self-pay | Admitting: Family Medicine

## 2018-03-18 VITALS — BP 122/88 | HR 89 | Temp 97.9°F | Ht 62.0 in | Wt 161.6 lb

## 2018-03-18 DIAGNOSIS — R195 Other fecal abnormalities: Secondary | ICD-10-CM | POA: Diagnosis not present

## 2018-03-18 DIAGNOSIS — Z1211 Encounter for screening for malignant neoplasm of colon: Secondary | ICD-10-CM

## 2018-03-18 DIAGNOSIS — F4321 Adjustment disorder with depressed mood: Secondary | ICD-10-CM

## 2018-03-18 NOTE — Progress Notes (Addendum)
Subjective:  [name] is a 77 y.o. year old very pleasant female patient who presents for/with See problem oriented charting ROS-  Has had some weigh tloss since lost her husband in February- has lower appetite. No fever or chills. Some abdominal cramping, some loose stools.   Past Medical History-  Patient Active Problem List   Diagnosis Date Noted  . Osteoporosis 11/05/2016    Priority: High  . Hypothyroidism 07/01/2013    Priority: Medium  . Essential hypertension 11/02/2012    Priority: Medium  . Hyperlipidemia, mixed 07/24/2011    Priority: Medium  . 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 Medicare annual wellness visit, subsequent 09/26/2014    Priority: Low  . Glaucoma 11/02/2012    Priority: Low  . Vitamin D deficiency 11/02/2012    Priority: Low  . Obesity (BMI 30-39.9) 07/24/2011    Priority: Low  . Grieving 02/18/2017    Medications- reviewed and updated Current Outpatient Medications  Medication Sig Dispense Refill  . brimonidine (ALPHAGAN) 0.2 % ophthalmic solution Apply to eye.    Mckinley Jewel Dimesylate (RHOPRESSA) 0.02 % SOLN Place into the right eye.    . pilocarpine (PILOCAR) 1 % ophthalmic solution Apply to eye.    Marland Kitchen AMBULATORY NON FORMULARY MEDICATION Take 180 mg by mouth daily. Medication Name: 180 mg bempedoic acid vs a placebo, CLEAR Research Study drug provided    . B Complex Vitamins (B COMPLEX 100 PO) Take 100 mg by mouth daily.    . Bimatoprost (LUMIGAN OP) Apply to eye as directed.    . calcium citrate (CALCITRATE - DOSED IN MG ELEMENTAL CALCIUM) 950 MG tablet Take 200 mg of elemental calcium by mouth daily.    . chlorthalidone (HYGROTON) 25 MG tablet TAKE (1/2) TABLET DAILY. 30 tablet 5  . Cholecalciferol (VITAMIN D-3) 5000 UNITS TABS Take 5,000 mg by mouth daily.    . Coenzyme Q10 (COQ10) 200 MG CAPS Take 1 capsule by mouth daily.    . dorzolamide-timolol (COSOPT) 22.3-6.8 MG/ML  ophthalmic solution Place 1 drop into the right eye 2 (two) times daily.    Javier Docker Oil 1000 MG CAPS Take by mouth. Mega Red    . levothyroxine (SYNTHROID, LEVOTHROID) 50 MCG tablet Take 1 tablet (50 mcg total) by mouth daily. 90 tablet 3  . Omega-3 Fatty Acids (OMEGA III EPA+DHA PO) Take by mouth.    Vladimir Faster Glycol-Propyl Glycol (SYSTANE OP) Apply to eye as directed.    . Probiotic Product (PROBIOTIC DAILY) CAPS Take by mouth daily. Reported on 10/10/2015     No current facility-administered medications for this visit.     Objective: BP 122/88 (BP Location: Left Arm, Patient Position: Sitting, Cuff Size: Normal)   Pulse 89   Temp 97.9 F (36.6 C) (Oral)   Ht 5\' 2"  (1.575 m)   Wt 161 lb 9.6 oz (73.3 kg)   SpO2 98%   BMI 29.56 kg/m  Gen: NAD, resting comfortably CV: RRR no murmurs rubs or gallops Lungs: CTAB no crackles, wheeze, rhonchi Abdomen: soft/nontender/nondistended/normal bowel sounds. No rebound or guarding.  Ext: no edema Skin: warm, dry Psych: tearful on several occasions surrounding loss of husband Rectal (per patient request)- no impaction  Assessment/Plan:  Loose stools S: called to get colonoscopy- got records sent but only sent last colonoscopy and Gi wanted all the records then got her focus off of that as husband was sick. Grandmother  died with colorectal cancer and so she is anxious. History of polyps. Pending colonoscopy with Dr. Fuller Plan- they now have her records.   June 30th- she ate a pizza and had a lot of blue cheese. Got abdominal cramping and then had a very large bowel movement and was loose a week ago.   Everyday was having small nonformed stools pale brown. Sunday the 7th was the first day without bowel movement. Friend brought some prune juice and colace and had a good bowel movement after that. Had a runny bowel movement with this.  A/P: Patient has had a really tough time since loss of her husband. Her anxiety is heightened. She was worried about  colon cancer. I reassured her. Did refer her to GI for colonoscopy as she was supposed to be on q5 year plan and looks like has been 8 years (see above discussion of GI getting records)  For loose stools discussed this likely would return to normal.  She had one day without bowel movement and was concerned she was constipated. I did a rectal examination at her request and there was no impaction ( a big concern of hers). She can use prune juice as needed  Grieving Changed title today from stress at home to grieving. She did have stress at home when caring for her now deceased husband- very kind man and physician who was a patient of mine.   Since his loss she has experienced intense grief. Provided extended counseling today including a hug at patient request and many tissues. We discussed getting into hospice for grief counseling- She is going to pursue this   Future Appointments  Date Time Provider [place]  03/31/2018 11:15 AM Marin Olp, MD [facility]  05/26/2018  9:00 AM [place] 2 [facility] None   Return precautions advised.  Garret Reddish, MD

## 2018-03-18 NOTE — Addendum Note (Signed)
Addended by: Marin Olp on: 03/18/2018 09:06 PM   Modules accepted: Level of Service

## 2018-03-18 NOTE — Patient Instructions (Addendum)
I would also like for you to sign up for an annual wellness visit with one of our nurses, Cassie or [name], who both specialize in the annual wellness visit. This is a free benefit under medicare that may help [place] find additional ways to help you. Some highlights are reviewing medications, lifestyle, and doing a dementia screen.   Please check with your pharmacy to see if they have the shingrix vaccine. If they do- please get this immunization and update [place] by phone call or mychart with dates you receive the vaccine  We will call you within two weeks about your referral to Dr. Fuller Plan of GI. If you do not hear within 3 weeks, give [place] a call.   I think it will take several weeks for your stools to normalize. No impaction and I suspect your risk of cancer is very very low. Great to see you today.   Go ahead and schedule a 3 month follow up (or sooner if you desire) so we can sit down and chat about your regular medical problems  See me back as needed for the bowel issues for worsening symptoms or if symptoms do not continue to improve  Please stop by the lab before you go.

## 2018-03-18 NOTE — Assessment & Plan Note (Signed)
Changed title today from stress at home to grieving. She did have stress at home when caring for her now deceased husband- very kind man and physician who was a patient of mine.   Since his loss she has experienced intense grief. Provided extended counseling today including a hug at patient request and many tissues. We discussed getting into hospice for grief counseling- She is going to pursue this

## 2018-03-19 ENCOUNTER — Encounter: Payer: Self-pay | Admitting: Gastroenterology

## 2018-03-31 ENCOUNTER — Ambulatory Visit (INDEPENDENT_AMBULATORY_CARE_PROVIDER_SITE_OTHER): Payer: Medicare Other | Admitting: Family Medicine

## 2018-03-31 ENCOUNTER — Encounter: Payer: Self-pay | Admitting: Family Medicine

## 2018-03-31 VITALS — BP 146/90 | HR 86 | Temp 98.3°F | Ht 62.0 in | Wt 159.4 lb

## 2018-03-31 DIAGNOSIS — I10 Essential (primary) hypertension: Secondary | ICD-10-CM

## 2018-03-31 DIAGNOSIS — E559 Vitamin D deficiency, unspecified: Secondary | ICD-10-CM

## 2018-03-31 DIAGNOSIS — Z0000 Encounter for general adult medical examination without abnormal findings: Secondary | ICD-10-CM

## 2018-03-31 DIAGNOSIS — E039 Hypothyroidism, unspecified: Secondary | ICD-10-CM | POA: Diagnosis not present

## 2018-03-31 DIAGNOSIS — R3915 Urgency of urination: Secondary | ICD-10-CM | POA: Diagnosis not present

## 2018-03-31 DIAGNOSIS — F4321 Adjustment disorder with depressed mood: Secondary | ICD-10-CM

## 2018-03-31 DIAGNOSIS — M810 Age-related osteoporosis without current pathological fracture: Secondary | ICD-10-CM

## 2018-03-31 LAB — COMPREHENSIVE METABOLIC PANEL
ALT: 19 U/L (ref 0–35)
AST: 14 U/L (ref 0–37)
Albumin: 4.6 g/dL (ref 3.5–5.2)
Alkaline Phosphatase: 40 U/L (ref 39–117)
BUN: 11 mg/dL (ref 6–23)
CO2: 29 mEq/L (ref 19–32)
Calcium: 9.4 mg/dL (ref 8.4–10.5)
Chloride: 103 mEq/L (ref 96–112)
Creatinine, Ser: 0.83 mg/dL (ref 0.40–1.20)
GFR: 70.92 mL/min (ref >60.00)
Glucose, Bld: 96 mg/dL (ref 70–99)
Potassium: 3.7 mEq/L (ref 3.5–5.1)
Sodium: 141 mEq/L (ref 135–145)
Total Bilirubin: 0.8 mg/dL (ref 0.2–1.2)
Total Protein: 7.3 g/dL (ref 6.0–8.3)

## 2018-03-31 LAB — LIPID PANEL
Cholesterol: 287 mg/dL — ABNORMAL HIGH (ref 0–200)
HDL: 51.5 mg/dL (ref >39.00)
LDL Cholesterol: 202 mg/dL — ABNORMAL HIGH (ref 0–99)
NonHDL: 235.37
Total CHOL/HDL Ratio: 6
Triglycerides: 165 mg/dL — ABNORMAL HIGH (ref 0.0–149.0)
VLDL: 33 mg/dL (ref 0.0–40.0)

## 2018-03-31 LAB — CBC WITH DIFFERENTIAL/PLATELET
Basophils Absolute: 0.1 10*3/uL (ref 0.0–0.1)
Basophils Relative: 1.1 % (ref 0.0–3.0)
Eosinophils Absolute: 0.1 10*3/uL (ref 0.0–0.7)
Eosinophils Relative: 1.6 % (ref 0.0–5.0)
HCT: 43.9 % (ref 36.0–46.0)
Hemoglobin: 14.6 g/dL (ref 12.0–15.0)
Lymphocytes Relative: 25.2 % (ref 12.0–46.0)
Lymphs Abs: 1.8 10*3/uL (ref 0.7–4.0)
MCHC: 33.3 g/dL (ref 30.0–36.0)
MCV: 90.6 fl (ref 78.0–100.0)
Monocytes Absolute: 0.5 10*3/uL (ref 0.1–1.0)
Monocytes Relative: 7 % (ref 3.0–12.0)
Neutro Abs: 4.6 10*3/uL (ref 1.4–7.7)
Neutrophils Relative %: 65.1 % (ref 43.0–77.0)
Platelets: 294 10*3/uL (ref 150.0–400.0)
RBC: 4.85 Mil/uL (ref 3.87–5.11)
RDW: 13.2 % (ref 11.5–15.5)
WBC: 7 10*3/uL (ref 4.0–10.5)

## 2018-03-31 LAB — VITAMIN D 25 HYDROXY (VIT D DEFICIENCY, FRACTURES): VITD: 18.46 ng/mL — ABNORMAL LOW (ref 30.00–100.00)

## 2018-03-31 LAB — POC URINALSYSI DIPSTICK (AUTOMATED)
Bilirubin, UA: NEGATIVE
Blood, UA: NEGATIVE
Glucose, UA: NEGATIVE
Leukocytes, UA: NEGATIVE
Nitrite, UA: NEGATIVE
Protein, UA: NEGATIVE
Spec Grav, UA: 1.025 (ref 1.010–1.025)
Urobilinogen, UA: 0.2 E.U./dL
pH, UA: 5.5 (ref 5.0–8.0)

## 2018-03-31 LAB — TSH: TSH: 1.98 u[IU]/mL (ref 0.35–4.50)

## 2018-03-31 NOTE — Assessment & Plan Note (Signed)
Got prolia here in May- some issue with coordinating labs at [facility]- primarily followed by Dr. Layne Benton. We can give other doses here if needed.

## 2018-03-31 NOTE — Assessment & Plan Note (Addendum)
Levothyroxine 50 mcg- TSh slightly off last check- will update today.hadnt had hair growth in years since going on arimedix. Came off 5-6 years ago and now having hair growth in axilla and groin again- feels like growing quickly If TSH off could be worsening anxiety or causing weight loss or night sweats. Seems like hair growth may have been related to arimidex and has resoled now with some years off med Lab Results  Component Value Date   TSH 4.54 (H) 05/21/2017

## 2018-03-31 NOTE — Assessment & Plan Note (Signed)
Strongly encouraged hospice counseling- she is getting very anxious about many physical symptoms as used to be comforted by spouse who was an MD. She doesn't have that safety net anymore and feels it is hard to control emotions. Plus just misses him terribly. Also discussed [place] behavioral [facility]- she will strongly consider this as well. We talked about using meds like SSRI in future if not making improvement- she would like to avoid and use counseling

## 2018-03-31 NOTE — Assessment & Plan Note (Addendum)
BP high today- didn't take meds before coming in. Home #s have looked great overall raning from 100s to 140s maximum with many #s in 110s or 120s. She is on chlorthalidone 12.5mg . wouldnt be able to titrate up with SBP into low 100s at times even if was more consistently in 140s.

## 2018-03-31 NOTE — Patient Instructions (Signed)
Lets follow more closely until you feel like you are in a better place. See me in 4-6 weeks. Try to remember to eat 3 meals a day- we want your weight to stay more stable by next time.   Please stop by lab before you go

## 2018-03-31 NOTE — Progress Notes (Signed)
Phone: [phone]  Subjective:  Patient presents today for their annual physical. Chief complaint-noted.   See problem oriented charting- ROS- full  review of systems was completed and negative except for: night sweats, vision issues- working with [facility], loose stools now with infrequent BMs, urinary urgency and frequency  The following were reviewed and entered/updated in epic: Past Medical History:  Diagnosis Date  . Abnormal thyroid function test 07/01/2013  . Arthritis   . Cancer ([place])   . Dermatitis 11/25/2013  . Dyslipidemia   . Glaucoma 11/02/2012  . HTN (hypertension) 11/02/2012  . 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 Hyperlipidemia   . Obesity    Patient Active Problem List   Diagnosis Date Noted  . Grieving 02/18/2017    Priority: High  . Osteoporosis 11/05/2016    Priority: High  . Hypothyroidism 07/01/2013    Priority: Medium  . Essential hypertension 11/02/2012    Priority: Medium  . Hyperlipidemia, mixed 07/24/2011    Priority: Medium  . 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 Medicare annual wellness visit, subsequent 09/26/2014    Priority: Low  . Glaucoma 11/02/2012    Priority: Low  . Vitamin D deficiency 11/02/2012    Priority: Low  . Obesity (BMI 30-39.9) 07/24/2011    Priority: Low   Past Surgical History:  Procedure Laterality Date  . AUGMENTATION MAMMAPLASTY Left   . BREAST SURGERY  10/2005   Left Mastectomy, reconstruction, reduction on right. Dr. Harlow Mares  . CRYOTHERAPY     GYN for CIN III  . DILATION AND CURETTAGE OF UTERUS    . EXCISION MORTON'S NEUROMA     right foot  . MASTECTOMY Left   . REDUCTION MAMMAPLASTY Right     Family History  Problem Relation Age of Onset  . Pneumonia Mother   . Hyperlipidemia Mother   . Parkinsonism Mother   . Other Father        bilateral subdural hematoma  . Colon cancer Paternal Grandmother     . Hyperlipidemia Sister   . Prostate cancer Neg Hx   . Breast cancer Neg Hx   . Heart disease Neg Hx   . Diabetes Neg Hx     Medications- reviewed and updated Current Outpatient Medications  Medication Sig Dispense Refill  . AMBULATORY NON FORMULARY MEDICATION Take 180 mg by mouth daily. Medication Name: 180 mg bempedoic acid vs a placebo, CLEAR Research Study drug provided    . Bimatoprost (LUMIGAN OP) Apply to eye as directed.    . brimonidine (ALPHAGAN) 0.2 % ophthalmic solution Apply to eye.    . chlorthalidone (HYGROTON) 25 MG tablet TAKE (1/2) TABLET DAILY. 30 tablet 5  . Cholecalciferol (VITAMIN D-3) 5000 UNITS TABS Take 5,000 mg by mouth daily.    . Coenzyme Q10 (COQ10) 200 MG CAPS Take 1 capsule by mouth daily.    . dorzolamide-timolol (COSOPT) 22.3-6.8 MG/ML ophthalmic solution Place 1 drop into the right eye 2 (two) times daily.    Javier Docker Oil 1000 MG CAPS Take by mouth. Mega Red    . levothyroxine (SYNTHROID, LEVOTHROID) 50 MCG tablet Take 1 tablet (50 mcg total) by mouth daily. 90 tablet 3  . Omega-3 Fatty Acids (OMEGA III EPA+DHA PO) Take by mouth.    . pilocarpine (PILOCAR) 1 % ophthalmic solution Apply to eye.    Vladimir Faster Glycol-Propyl Glycol (SYSTANE OP) Apply  to eye as directed.    . Probiotic Product (PROBIOTIC DAILY) CAPS Take by mouth daily. Reported on 10/10/2015     No current facility-administered medications for this visit.     Allergies-reviewed and updated Allergies  Allergen Reactions  . Livalo [Pitavastatin]     ENTERED IN ERROR  . Rosuvastatin Other (See Comments)    Joint & muscle aches, sleep issues.  . Adhesive [Tape] Other (See Comments)    Causes skin to turn red where touched    Social History   Social History Narrative   Married 1981. 1 daughter, 2 step sons. 4 grandchildren      Oncologist      Hobbies: going out to eat, activities with church including cancer support group at church    Objective: BP (!) 146/90 (BP  Location: Right Arm, Cuff Size: Normal)   Pulse 86   Temp 98.3 F (36.8 C) (Oral)   Ht 5' 2" (1.575 m)   Wt 159 lb 6.1 oz (72.3 kg)   SpO2 97%   BMI 29.15 kg/m  Gen: NAD, resting comfortably HEENT: Mucous membranes are moist. Oropharynx normal Neck: no thyromegaly CV: RRR no murmurs rubs or gallops Lungs: CTAB no crackles, wheeze, rhonchi Abdomen: soft/nontender/nondistended/normal bowel sounds. No rebound or guarding.  Ext: no edema Skin: warm, dry Neuro: grossly normal, moves all extremities, PERRLA Psych: tearful throughout visit  Assessment/Plan:  77 y.o. female presenting for annual physical.  Health Maintenance counseling: 1. Anticipatory guidance: Patient counseled regarding regular dental exams -q3-6 months, eye exams -followed closely with Dr. Katy Fitch 3x a year. Was told needs surgery- she is going to get another opinion at [place] on laser surgery related to glaucoma, wearing seatbelts.  2. Risk factor reduction:  Advised patient of need for regular exercise and diet rich and fruits and vegetables to reduce risk of heart attack and stroke. Exercise- twice a week with trainer at the house. Diet-has really changed- eating out less.  Wt Readings from Last 3 Encounters:  03/31/18 159 lb 6.1 oz (72.3 kg)  03/18/18 161 lb 9.6 oz (73.3 kg)  03/06/18 163 lb (73.9 kg)  3. Immunizations/screenings/ancillary studies- discussed shingrix - she is on waiting list at [facility] Immunization History  Administered Date(s) Administered  . Influenza Split 07/24/2011  . Influenza Whole 06/15/2009  . Influenza, High Dose Seasonal PF 06/29/2013, 07/05/2015, 05/10/2016, 05/21/2017  . Influenza,inj,Quad PF,6+ Mos 06/21/2014, 06/14/2016  . Pneumococcal Conjugate-13 04/15/2014  . Pneumococcal Polysaccharide-23 07/17/2000, 10/02/2016  . Tdap 07/17/2010  4. Cervical cancer screening- normal pap smear last year- patient really wanted this done- would not advise repeat 5. Breast cancer  history 2006 s/p left mastectomy-  breast exam declined and mammogram 01/24/18 6. Colon cancer screening - 2011- has visit with Dr. Fuller Plan in september 7. Skin cancer screening- never saw drematology. advised regular sunscreen use. Denies worrisome, changing, or new skin lesions.   8. Osteoporosis management- 10/29/16 noted- she was given prolia 01/23/18 and planned 6 month follow up with Dr. Layne Benton- we may help if needed with injections.   Status of chronic or acute concerns   Loose stools-Using probiotic and loose stools are improving. Has Gi appointment in September. Last colonoscopy 2011 and she thinks she was told sooner than 10 years so will get GI opinion. Dr. Deatra Ina notes suggested 5-10 years  Having some night sweats particularly around her neck. Also has had some weight loss 20 lbs in 16 months since loss of husband. She had been 160  in April 2017 before husband got really sick- she was going to weight watchers at that time. Also used to eat out more with husband- eating at home more We will update labs- close follow up 4-6 weeks to reevaluate weight, night sweats. Could consider ESR or CRP. Doubt leukemia  Urinary urgency Worried about UTI. Has had some suprapubic pressure for a week. Drinking cranberry juice. Urgency to urinate. Some pressure with peeing.   Hypothyroidism Levothyroxine 50 mcg- TSh slightly off last check- will update today.hadnt had hair growth in years since going on arimedix. Came off 5-6 years ago and now having hair growth in axilla and groin again- feels like growing quickly If TSH off could be worsening anxiety or causing weight loss or night sweats. Seems like hair growth may have been related to arimidex and has resoled now with some years off med Lab Results  Component Value Date   TSH 4.54 (H) 05/21/2017     Essential hypertension BP high today- didn't take meds before coming in. Home #s have looked great overall raning from 100s to 140s maximum with many  #s in 110s or 120s. She is on chlorthalidone 12.64m. wouldnt be able to titrate up with SBP into low 100s at times even if was more consistently in 140s.   Osteoporosis Got prolia here in May- some issue with coordinating labs at [facility]- primarily followed by Dr. BLayne Benton We can give other doses here if needed.   Grieving Strongly encouraged hospice counseling- she is getting very anxious about many physical symptoms as used to be comforted by spouse who was an MD. She doesn't have that safety net anymore and feels it is hard to control emotions. Plus just misses him terribly. Also discussed [facility]- she will strongly consider this as well. We talked about using meds like SSRI in future if not making improvement- she would like to avoid and use counseling   Future Appointments  Date Time Provider [place] 05/26/2018  9:00 AM [facility] None  05/26/2018  2:45 PM SLadene Artist MD [facility]   4-6 weeks  Lab/Order associations: Preventative health care - Plan: CBC with Differential/Platelet, Comprehensive metabolic panel, Lipid panel, TSH, VITAMIN D 25 Hydroxy (Vit-D Deficiency, Fractures), POCT Urinalysis Dipstick (Automated)  Hypothyroidism, unspecified type - Plan: TSH  Essential hypertension - Plan: CBC with Differential/Platelet, Comprehensive metabolic panel, Lipid panel  Vitamin D deficiency - Plan: VITAMIN D 25 Hydroxy (Vit-D Deficiency, Fractures)  Urinary urgency - Plan: POCT Urinalysis Dipstick (Automated)  Age-related osteoporosis without current pathological fracture  Grieving  Return precautions advised.  SGarret Reddish MD

## 2018-03-31 NOTE — Addendum Note (Signed)
Addended by: Kayren Eaves T on: 03/31/2018 01:01 PM   Modules accepted: Orders

## 2018-04-02 ENCOUNTER — Telehealth: Payer: Self-pay | Admitting: Family Medicine

## 2018-04-02 NOTE — Telephone Encounter (Signed)
The patient wanted to make sure it was okay to continue taking her Vitamin D over the counter medication since it helped before. She was also very appreciative of Dr Ansel Bong time and treatment towards her. She sent a letter to the office for him to thank him.

## 2018-04-03 ENCOUNTER — Other Ambulatory Visit: Payer: Self-pay

## 2018-04-03 MED ORDER — EZETIMIBE 10 MG PO TABS: 10.0000 mg | ORAL_TABLET | Freq: Every day | ORAL | 5 refills | Status: DC

## 2018-04-03 NOTE — Telephone Encounter (Signed)
Called and left a voicemail message letting patient know that it is ok for her to continue her Vit D 5,000U daily. I provided a call back number for questions

## 2018-05-05 ENCOUNTER — Ambulatory Visit: Payer: Medicare Other | Admitting: Family Medicine

## 2018-05-05 ENCOUNTER — Encounter: Payer: Self-pay | Admitting: Family Medicine

## 2018-05-05 DIAGNOSIS — F4321 Adjustment disorder with depressed mood: Secondary | ICD-10-CM

## 2018-05-05 DIAGNOSIS — I10 Essential (primary) hypertension: Secondary | ICD-10-CM

## 2018-05-05 DIAGNOSIS — E039 Hypothyroidism, unspecified: Secondary | ICD-10-CM | POA: Diagnosis not present

## 2018-05-05 MED ORDER — EZETIMIBE 10 MG PO TABS: 10.0000 mg | ORAL_TABLET | Freq: Every day | ORAL | 3 refills | Status: DC

## 2018-05-05 NOTE — Assessment & Plan Note (Signed)
S: controlled at home on chlorthalidone 12.5mg . She didn't take her meds before last visit. she took her meds today but BP remains high- she admits to some anxiety at home   Home #s remain reasonably controlled- her 15 day average is 127/75.  BP Readings from Last 3 Encounters:  03/31/18 (!) 146/90  03/18/18 122/88  03/06/18 120/68  A/P: We discussed blood pressure goal of <140/90. Continue current meds

## 2018-05-05 NOTE — Progress Notes (Signed)
Subjective:  [name] is a 77 y.o. year old very pleasant female patient who presents for/with See problem oriented charting ROS- No chest pain or shortness of breath. No headache. Mild redness in eyes with new eye drop- mild itching- plans to discuss with optho   Past Medical History-  Patient Active Problem List   Diagnosis Date Noted  . Grieving 02/18/2017    Priority: High  . Osteoporosis 11/05/2016    Priority: High  . Hypothyroidism 07/01/2013    Priority: Medium  . Essential hypertension 11/02/2012    Priority: Medium  . Hyperlipidemia, mixed 07/24/2011    Priority: Medium  . 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 Medicare annual wellness visit, subsequent 09/26/2014    Priority: Low  . Glaucoma 11/02/2012    Priority: Low  . Vitamin D deficiency 11/02/2012    Priority: Low  . Obesity (BMI 30-39.9) 07/24/2011    Priority: Low  . Primary open-angle glaucoma 11/20/2017  . Pseudophakia of both eyes 11/20/2017    Medications- reviewed and updated Current Outpatient Medications  Medication Sig Dispense Refill  . AMBULATORY NON FORMULARY MEDICATION Take 180 mg by mouth daily. Medication Name: 180 mg bempedoic acid vs a placebo, CLEAR Research Study drug provided    . Bimatoprost (LUMIGAN OP) Apply to eye as directed.    . brimonidine (ALPHAGAN) 0.2 % ophthalmic solution Apply to eye.    . chlorthalidone (HYGROTON) 25 MG tablet TAKE (1/2) TABLET DAILY. 30 tablet 5  . Cholecalciferol (VITAMIN D-3) 5000 UNITS TABS Take 5,000 mg by mouth daily.    . Coenzyme Q10 (COQ10) 200 MG CAPS Take 1 capsule by mouth daily.    . dorzolamide-timolol (COSOPT) 22.3-6.8 MG/ML ophthalmic solution Place 1 drop into the right eye 2 (two) times daily.    Marland Kitchen ezetimibe (ZETIA) 10 MG tablet Take 1 tablet (10 mg total) by mouth daily. 90 tablet 3  . Krill Oil 1000 MG CAPS Take by mouth. Mega Red    . levothyroxine (SYNTHROID,  LEVOTHROID) 50 MCG tablet Take 1 tablet (50 mcg total) by mouth daily. 90 tablet 3  . moxifloxacin (VIGAMOX) 0.5 % ophthalmic solution Place 1 drop into the right eye 3 (three) times daily.     . Omega-3 Fatty Acids (OMEGA III EPA+DHA PO) Take by mouth.    . pilocarpine (PILOCAR) 1 % ophthalmic solution Apply to eye.    Vladimir Faster Glycol-Propyl Glycol (SYSTANE OP) Apply to eye as needed.     . Probiotic Product (PROBIOTIC DAILY) CAPS Take by mouth daily. Reported on 10/10/2015     No current facility-administered medications for this visit.     Objective: BP (!) 150/88 (BP Location: Right Arm, Patient Position: Sitting, Cuff Size: Normal)   Pulse 95   Temp 98.2 F (36.8 C) (Oral)   Ht 5\' 2"  (1.575 m)   Wt 157 lb 6.1 oz (71.4 kg)   SpO2 96%   BMI 28.79 kg/m  Gen: NAD, resting comfortably CV: RRR no murmurs rubs or gallops Lungs: CTAB no crackles, wheeze, rhonchi Abdomen: soft/nontender Ext: no edema Skin: warm, dry  Assessment/Plan:  Other notes: 1. eye surgery planned for Thursday- [facility]. Glaucoma related.   Loose stools- now resolved S: complained of loose stools last visit. Probiotic seemed to be helping last visit. In under a month sees Dr. Fuller Plan. She had colonoscopy 2011 and she thinks she was told sooner  than 10 years so she wants Dr. Silvio Pate opinion. Dr. Deatra Ina mentioned 5-10 years  She thinks eating better has been the biggest thing and loose stools are better A/P: she is going to follow up with   Hypothyroidism S: On thyroid medication-levothyroxine 50 mcg.  With anxiety last visit we had discussed possibility of thyroid being off but thankfully was not Lab Results  Component Value Date   TSH 1.98 03/31/2018  A/P: continue current rx  Essential hypertension S: controlled at home on chlorthalidone 12.5mg . She didn't take her meds before last visit. she took her meds today but BP remains high- she admits to some anxiety at home   Home #s remain  reasonably controlled- her 15 day average is 127/75.  BP Readings from Last 3 Encounters:  03/31/18 (!) 146/90  03/18/18 122/88  03/06/18 120/68  A/P: We discussed blood pressure goal of <140/90. Continue current meds   Grieving S:  last visit strongly encouraged hospice counseling- a lot of anxiety over her physical symptoms- used to be comforted by spouse who was an MD (now deceased). Continues to miss him terribly.   Last visit also advised behavioral health with [place]  We had discussed using SSRI if not making improvement with hospice counseling or behavioral health counseling A/P: Fortunately patient states she has made huge strides since last visit. She states the power of prayer has been incredibly helpful to her. Does agree to still go to grief counseling either through hospice or [place] behavioral [facility] after eye surgery- I think this is reasonable but glad we can  Hold off on meds   Future Appointments  Date Time Provider [place]  05/26/2018  9:00 AM [place] [facility] None  05/26/2018  2:45 PM Ladene Artist, MD [facility]   3-6 month verbal  Meds ordered this encounter  Medications  . ezetimibe (ZETIA) 10 MG tablet    Sig: Take 1 tablet (10 mg total) by mouth daily.    Dispense:  90 tablet    Refill:  3    Return precautions advised.  Garret Reddish, MD

## 2018-05-05 NOTE — Assessment & Plan Note (Signed)
S:  last visit strongly encouraged hospice counseling- a lot of anxiety over her physical symptoms- used to be comforted by spouse who was an MD (now deceased). Continues to miss him terribly.   Last visit also advised behavioral health with [place]  We had discussed using SSRI if not making improvement with hospice counseling or behavioral health counseling A/P: Fortunately patient states she has made huge strides since last visit. She states the power of prayer has been incredibly helpful to her. Does agree to still go to grief counseling either through hospice or [place] behavioral health after eye surgery- I think this is reasonable but glad we can  Hold off on meds

## 2018-05-05 NOTE — Patient Instructions (Signed)
No changes today  I think when things settle down from the eyes- make sense to get into hospice grief counseling or behavioral health with [place]  So thrilled with your home #s on blood pressure though slightly high today in office  Also thrilled you are feeling better overall

## 2018-05-05 NOTE — Assessment & Plan Note (Signed)
S: On thyroid medication-levothyroxine 50 mcg.  With anxiety last visit we had discussed possibility of thyroid being off but thankfully was not Lab Results  Component Value Date   TSH 1.98 03/31/2018  A/P: continue current rx

## 2018-05-07 DIAGNOSIS — H401413 Capsular glaucoma with pseudoexfoliation of lens, right eye, severe stage: Secondary | ICD-10-CM | POA: Insufficient documentation

## 2018-05-08 HISTORY — PX: EYE SURGERY: SHX253

## 2018-05-26 ENCOUNTER — Ambulatory Visit: Payer: Medicare Other | Admitting: Gastroenterology

## 2018-05-26 ENCOUNTER — Encounter: Payer: Self-pay | Admitting: Gastroenterology

## 2018-05-26 ENCOUNTER — Encounter: Payer: Medicare Other | Admitting: *Deleted

## 2018-05-26 VITALS — BP 128/80 | HR 72 | Ht 62.0 in | Wt 158.0 lb

## 2018-05-26 DIAGNOSIS — Z86010 Personal history of colonic polyps: Secondary | ICD-10-CM

## 2018-05-26 NOTE — Progress Notes (Signed)
History of Present Illness: This is a 77 year old female referred by Marin Olp, MD for the evaluation of a personal history of colon polyps, type unknown, in 2000.  She had colonoscopies performed in 2000, 2006 and 2011 all in [place], [place].  Procedure reports are available for review.  Her colonoscopy in 2000 showed one 5 mm polyp which was removed however the path is not available.  The 2 subsequent colonoscopies were normal.  5-year interval colonoscopies were recommended.  She has a grandparent with colon cancer.  Her husband was as OB/GYN who practiced in [place].  He retired in his 43s and he passed away this 11-12-2022.  Patient is currently undergoing glaucoma treatments at a [facility] in [place] on a weekly basis. Denies weight loss, abdominal pain, constipation, diarrhea, change in stool caliber, melena, hematochezia, nausea, vomiting, dysphagia, reflux symptoms, chest pain.     Allergies  Allergen Reactions  . Livalo [Pitavastatin]     ENTERED IN ERROR  . Rosuvastatin Other (See Comments)    Joint & muscle aches, sleep issues.  . Adhesive [Tape] Other (See Comments)    Causes skin to turn red where touched   Outpatient Medications Prior to Visit  Medication Sig Dispense Refill  . AMBULATORY NON FORMULARY MEDICATION Take 180 mg by mouth daily. Medication Name: 180 mg bempedoic acid vs a placebo, CLEAR Research Study drug provided    . atropine 1 % ophthalmic solution Apply 1 drop to eye daily.    . Bimatoprost (LUMIGAN OP) Apply to eye as directed.    . brimonidine (ALPHAGAN) 0.2 % ophthalmic solution Apply to eye.    . chlorthalidone (HYGROTON) 25 MG tablet TAKE (1/2) TABLET DAILY. 30 tablet 5  . Cholecalciferol (VITAMIN D-3) 5000 UNITS TABS Take 5,000 mg by mouth daily.    . Coenzyme Q10 (COQ10) 200 MG CAPS Take 1 capsule by mouth daily.    . dorzolamide-timolol (COSOPT) 22.3-6.8 MG/ML ophthalmic solution Place 1 drop into the right eye 2 (two) times  daily.    Marland Kitchen ezetimibe (ZETIA) 10 MG tablet Take 1 tablet (10 mg total) by mouth daily. 90 tablet 3  . Krill Oil 1000 MG CAPS Take by mouth. Mega Red    . levothyroxine (SYNTHROID, LEVOTHROID) 50 MCG tablet Take 1 tablet (50 mcg total) by mouth daily. 90 tablet 3  . moxifloxacin (VIGAMOX) 0.5 % ophthalmic solution Place 1 drop into the right eye 3 (three) times daily.     . Omega-3 Fatty Acids (OMEGA III EPA+DHA PO) Take by mouth.    . pilocarpine (PILOCAR) 1 % ophthalmic solution Apply to eye.    Vladimir Faster Glycol-Propyl Glycol (SYSTANE OP) Apply to eye as needed.     . Probiotic Product (PROBIOTIC DAILY) CAPS Take by mouth daily. Reported on 10/10/2015     No facility-administered medications prior to visit.    Past Medical History:  Diagnosis Date  . Abnormal thyroid function test 07/01/2013  . Anxiety   . Arthritis   . Cancer ([place])   . Dermatitis 11/25/2013  . Dyslipidemia   . Glaucoma 11/02/2012  . HTN (hypertension) 11/02/2012  . 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 Hyperlipidemia   . Obesity    Past Surgical History:  Procedure Laterality Date  . AUGMENTATION MAMMAPLASTY Left   . BREAST SURGERY  2005/11/12   Left Mastectomy, reconstruction, reduction on right. Dr. Harlow Mares  . CRYOTHERAPY  GYN for CIN III  . DILATION AND CURETTAGE OF UTERUS    . EXCISION MORTON'S NEUROMA     right foot  . EYE SURGERY Right   . MASTECTOMY Left   . REDUCTION MAMMAPLASTY Right    Social History   Socioeconomic History  . Marital status: Married    Spouse name: Not on file  . Number of children: Not on file  . Years of education: Not on file  . Highest education level: Not on file  Occupational History  . Not on file  Social Needs  . Financial resource strain: Not on file  . Food insecurity:    Worry: Not on file    Inability: Not on file  . Transportation needs:    Medical: Not on file    Non-medical: Not on file  Tobacco Use  .  Smoking status: Never Smoker  . Smokeless tobacco: Never Used  Substance and Sexual Activity  . Alcohol use: Yes    Comment: social, rare  . Drug use: No  . Sexual activity: Not on file  Lifestyle  . Physical activity:    Days per week: Not on file    Minutes per session: Not on file  . Stress: Not on file  Relationships  . Social connections:    Talks on phone: Not on file    Gets together: Not on file    Attends religious service: Not on file    Active member of club or organization: Not on file    Attends meetings of clubs or organizations: Not on file    Relationship status: Not on file  Other Topics Concern  . Not on file  Social History Narrative   Married 1981. 1 daughter, 2 step sons. 4 grandchildren      Oncologist      Hobbies: going out to eat, activities with church including cancer support group at church   Family History  Problem Relation Age of Onset  . Pneumonia Mother   . Hyperlipidemia Mother   . Parkinsonism Mother   . Other Father        bilateral subdural hematoma  . Colon cancer Paternal Grandmother   . Hyperlipidemia Sister   . Prostate cancer Neg Hx   . Breast cancer Neg Hx   . Heart disease Neg Hx   . Diabetes Neg Hx        Review of Systems: Pertinent positive and negative review of systems were noted in the above HPI section. All other review of systems were otherwise negative.    Physical Exam: General: Well developed, well nourished, no acute distress Head: Normocephalic and atraumatic Eyes:  sclerae anicteric, EOMI Ears: Normal auditory acuity Mouth: No deformity or lesions Neck: Supple, no masses or thyromegaly Lungs: Clear throughout to auscultation Heart: Regular rate and rhythm; no murmurs, rubs or bruits Abdomen: Soft, non tender and non distended. No masses, hepatosplenomegaly or hernias noted. Normal Bowel sounds Rectal: Deferred to colonoscopy  Musculoskeletal: Symmetrical with no gross deformities  Skin: No  lesions on visible extremities Pulses:  Normal pulses noted Extremities: No clubbing, cyanosis, edema or deformities noted Neurological: Alert oriented x 4, grossly nonfocal Cervical Nodes:  No significant cervical adenopathy Inguinal Nodes: No significant inguinal adenopathy Psychological:  Alert and cooperative. Normal mood and affect   Assessment and Recommendations:  1.  Personal history of colon polyps, type unknown.  Based on her prior recommendations for every 5-year colonoscopies will presume her initial polyp was precancerous.  She has a grandparent with colon cancer.  Schedule colonoscopy when cleared by her ophthalmologist to proceed.  Patient states she would like to proceed when her glaucoma treatments have been completed and she will inquire about the timing at her next ophthalmology appointment.  She will call back to schedule colonoscopy at her convenience later this fall or winter. The risks (including bleeding, perforation, infection, missed lesions, medication reactions and possible hospitalization or surgery if complications occur), benefits, and alternatives to colonoscopy with possible biopsy and possible polypectomy were discussed with the patient and they consent to proceed.     cc: Marin Olp, [facility] [address] [place], [place] [address]

## 2018-05-26 NOTE — Patient Instructions (Signed)
It has been recommended to you by your physician that you have a(n) Colonoscopy completed. Per your request, we did not schedule the procedure(s) today. Please contact our office at [phone] should you decide to have the procedure completed.  Thank you for choosing me and [facility].  Malcolm T. Stark, Jr., MD., FACG  

## 2018-06-04 ENCOUNTER — Encounter: Payer: Self-pay | Admitting: *Deleted

## 2018-06-04 DIAGNOSIS — Z006 Encounter for examination for normal comparison and control in clinical research program: Secondary | ICD-10-CM

## 2018-06-04 NOTE — Research (Signed)
Subject to research clinic 05/26/18 for visit T8M18 in the Clear research study.  No cos, aes or saes to report.  Patient has had a procedure which will be captured on eCRF.  Subject 81% compliant with meds and new drug dispensed.  Next f/u phone call and clinic visit scheduled.  Subect re-consented to: [place] version 6.0 10jan2019 Local [id number]  Subject met inclusion and exclusion criteria.  The informed consent form, study requirements and expectations were reviewed with the subject and questions and concerns were addressed prior to the signing of the consent form.  The subject verbalized understanding of the trial requirements.  The subject agreed to participate in the CLEAR trial and signed the informed consent.  The informed consent was obtained prior to performance of any protocol-specific procedures for the subject.  A copy of the signed informed consent was given to the subject and a copy was placed in the subject's medical record.

## 2018-06-11 DIAGNOSIS — Z9889 Other specified postprocedural states: Secondary | ICD-10-CM | POA: Insufficient documentation

## 2018-06-11 DIAGNOSIS — H401422 Capsular glaucoma with pseudoexfoliation of lens, left eye, moderate stage: Secondary | ICD-10-CM | POA: Insufficient documentation

## 2018-06-18 ENCOUNTER — Ambulatory Visit (INDEPENDENT_AMBULATORY_CARE_PROVIDER_SITE_OTHER): Payer: Medicare Other

## 2018-06-18 DIAGNOSIS — Z23 Encounter for immunization: Secondary | ICD-10-CM | POA: Diagnosis not present

## 2018-06-18 NOTE — Progress Notes (Signed)
Patient in today for flu vaccine. VIS given. Administered in right arm Patient tolerated well

## 2018-06-18 NOTE — Patient Instructions (Signed)
There are no preventive care reminders to display for this patient.  Depression screen [facility] 2/9 03/06/2018 11/23/2016 11/01/2016  Decreased Interest 0 0 0  Down, Depressed, Hopeless 0 0 0  PHQ - 2 Score 0 0 0

## 2018-07-17 ENCOUNTER — Ambulatory Visit: Payer: Medicare Other | Admitting: Family Medicine

## 2018-07-17 ENCOUNTER — Encounter: Payer: Self-pay | Admitting: Family Medicine

## 2018-07-17 VITALS — BP 135/72 | HR 104 | Temp 97.9°F | Ht 62.0 in | Wt 157.4 lb

## 2018-07-17 DIAGNOSIS — J069 Acute upper respiratory infection, unspecified: Secondary | ICD-10-CM

## 2018-07-17 DIAGNOSIS — H401190 Primary open-angle glaucoma, unspecified eye, stage unspecified: Secondary | ICD-10-CM | POA: Diagnosis not present

## 2018-07-17 MED ORDER — BENZONATATE 100 MG PO CAPS: 100.0000 mg | ORAL_CAPSULE | Freq: Three times a day (TID) | ORAL | 0 refills | Status: DC | PRN

## 2018-07-17 NOTE — Progress Notes (Signed)
PCP: Marin Olp, MD  Subjective:  [name] is a 77 y.o. year old very pleasant female patient who presents with Upper Respiratory infection symptoms including nasal congestion,  cough   She thought it was allergies.  Picked up claritin and mucinex but hasnt kicked it Cough is her worst symptom. Productive cough.  Worse at night. Poor sleep- last 3 nights.  Some back pain from cough Clear discharge from nose. No sinus pain.   -started: 6 days ago, symptoms show no change -previous treatments:  -sick contacts/travel/risks: denies flu exposure.  -Hx of: allergies  ROS-no fevers, chills, fatigue/malaise, nausea/vomiting, or recent weight change.  Denies shortness of breath or wheezing  Pertinent Past Medical History- Has glaucoma.  Avoid prednisone.  Patient Active Problem List   Diagnosis Date Noted  . Grieving 02/18/2017    Priority: High  . Osteoporosis 11/05/2016    Priority: High  . Primary open-angle glaucoma 11/20/2017    Priority: Medium  . Hypothyroidism 07/01/2013    Priority: Medium  . Essential hypertension 11/02/2012    Priority: Medium  . Hyperlipidemia, mixed 07/24/2011    Priority: Medium  . 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 Medicare annual wellness visit, subsequent 09/26/2014    Priority: Low  . Vitamin D deficiency 11/02/2012    Priority: Low  . Obesity (BMI 30-39.9) 07/24/2011    Priority: Low  . Pseudophakia of both eyes 11/20/2017   Medications- reviewed  Current Outpatient Medications  Medication Sig Dispense Refill  . AMBULATORY NON FORMULARY MEDICATION Take 180 mg by mouth daily. Medication Name: 180 mg bempedoic acid vs a placebo, CLEAR Research Study drug provided    . atropine 1 % ophthalmic solution Apply 1 drop to eye daily.    . Bimatoprost (LUMIGAN OP) Apply to eye as directed.    . brimonidine (ALPHAGAN) 0.2 % ophthalmic solution Apply to eye.    .  chlorthalidone (HYGROTON) 25 MG tablet TAKE (1/2) TABLET DAILY. 30 tablet 5  . Cholecalciferol (VITAMIN D-3) 5000 UNITS TABS Take 5,000 mg by mouth daily.    . Coenzyme Q10 (COQ10) 200 MG CAPS Take 1 capsule by mouth daily.    . dorzolamide-timolol (COSOPT) 22.3-6.8 MG/ML ophthalmic solution Place 1 drop into the right eye 2 (two) times daily.    Marland Kitchen ezetimibe (ZETIA) 10 MG tablet Take 1 tablet (10 mg total) by mouth daily. 90 tablet 3  . Krill Oil 1000 MG CAPS Take by mouth. Mega Red    . levothyroxine (SYNTHROID, LEVOTHROID) 50 MCG tablet Take 1 tablet (50 mcg total) by mouth daily. 90 tablet 3  . moxifloxacin (VIGAMOX) 0.5 % ophthalmic solution Place 1 drop into the right eye 3 (three) times daily.     . Omega-3 Fatty Acids (OMEGA III EPA+DHA PO) Take by mouth.    . pilocarpine (PILOCAR) 1 % ophthalmic solution Apply to eye.    Vladimir Faster Glycol-Propyl Glycol (SYSTANE OP) Apply to eye as needed.     . Probiotic Product (PROBIOTIC DAILY) CAPS Take by mouth daily. Reported on 10/10/2015    . benzonatate (TESSALON) 100 MG capsule Take 1 capsule (100 mg total) by mouth 3 (three) times daily as needed for cough. 20 capsule 0   No current facility-administered medications for this visit.     Objective: BP 135/72 (BP Location: Right Arm, Patient Position: Sitting, Cuff Size: Large)   Pulse (!) 104   Temp  97.9 F (36.6 C) (Oral)   Ht 5\' 2"  (1.575 m)   Wt 157 lb 6.4 oz (71.4 kg)   SpO2 95%   BMI 28.79 kg/m  Gen: Appears fatigued, no increased respiratory effort HEENT: Turbinates erythematous with clear discharge, TM normal, pharynx mildly erythematous with no tonsilar exudate or edema, no sinus tenderness CV: RRR-not tachycardic on my exam- no murmurs rubs or gallops Lungs: CTAB no crackles, wheeze, rhonchi Abdomen: soft/nontender/nondistended/normal bowel sounds. No rebound or guarding.  Ext: no edema Skin: warm, dry, no rash Neuro: Speech normal, moves all  extremities  Assessment/Plan:  Upper Respiratory infection History and exam today are suggestive of viral infection most likely due to upper respiratory infection. Symptomatic treatment with: Tessalon Perles.  Due to her glaucoma history- we opted to avoid prednisone.  She does have a history of bronchitis that has responded well to azithromycin-I encouraged close follow-up in case she is not improving  Her right pupil was mildly dilated-she reports had an eye exam this week with dilation.  From AVS:  " The good news is we did not see any pneumonia today.  I also did not hear any rhonchi which would indicate bronchitis.  I also did not hear any wheezing which would go along with bronchitis  I think you are dealing with an upper respiratory infection and not an easy one.  I want you to see me back on Monday or Tuesday if you are not starting to turn the corner.  I am hopeful most of your symptoms are gone within a week from today but you might have a lingering mild cough or runny nose-should be much better though  I want you to try Tessalon Perles to see if they can help with your cough-you can use these up to 3 times a day.  In between this you can also use a small amount of honey to coat your throat and soothe your throat.  I want you to get plenty of rest and stay well-hydrated during this time.  If you can sleep an extra 1 or 2 hours a night and even get a daytime nap that would be fantastic "  We discussed that we did not find any infection that had higher probability of being bacterial such as pneumonia or strep throat. We discussed signs that bacterial infection may have developed particularly fever or shortness of breath. Likely course of 10 to 14 days. Patient is contagious and advised good handwashing and consideration of mask If going to be in public places.   Finally, we reviewed reasons to return to care including if symptoms worsen or persist or new concerns arise- once again  particularly shortness of breath or fever.  Meds ordered this encounter  Medications  . benzonatate (TESSALON) 100 MG capsule    Sig: Take 1 capsule (100 mg total) by mouth 3 (three) times daily as needed for cough.    Dispense:  20 capsule    Refill:  0    Garret Reddish, MD

## 2018-07-17 NOTE — Patient Instructions (Addendum)
The good news is we did not see any pneumonia today.  I also did not hear any rhonchi which would indicate bronchitis.  I also did not hear any wheezing which would go along with bronchitis  I think you are dealing with an upper respiratory infection and not an easy one.  I want you to see me back on Monday or Tuesday if you are not starting to turn the corner.  I am hopeful most of your symptoms are gone within a week from today but you might have a lingering mild cough or runny nose-should be much better though  I want you to try Tessalon Perles to see if they can help with your cough-you can use these up to 3 times a day.  In between this you can also use a small amount of honey to coat your throat and soothe your throat.  I want you to get plenty of rest and stay well-hydrated during this time.  If you can sleep an extra 1 or 2 hours a night and even get a daytime nap that would be fantastic

## 2018-07-22 ENCOUNTER — Other Ambulatory Visit: Payer: Self-pay | Admitting: Family Medicine

## 2018-08-13 ENCOUNTER — Encounter: Payer: Self-pay | Admitting: Gastroenterology

## 2018-09-15 ENCOUNTER — Ambulatory Visit (AMBULATORY_SURGERY_CENTER): Payer: Self-pay

## 2018-09-15 ENCOUNTER — Encounter: Payer: Self-pay | Admitting: Gastroenterology

## 2018-09-15 VITALS — Ht 62.0 in | Wt 159.0 lb

## 2018-09-15 DIAGNOSIS — Z86010 Personal history of colonic polyps: Secondary | ICD-10-CM

## 2018-09-15 MED ORDER — NA SULFATE-K SULFATE-MG SULF 17.5-3.13-1.6 GM/177ML PO SOLN: 1.0000 | Freq: Once | ORAL | 0 refills | Status: AC

## 2018-09-15 NOTE — Progress Notes (Signed)
Per pt, no allergies to soy or egg products.Pt not taking any weight loss meds or using  O2 at home.  Pt refused emmi video. 

## 2018-09-22 ENCOUNTER — Encounter: Payer: Self-pay | Admitting: Gastroenterology

## 2018-09-22 ENCOUNTER — Ambulatory Visit (AMBULATORY_SURGERY_CENTER): Payer: Medicare Other | Admitting: Gastroenterology

## 2018-09-22 VITALS — BP 160/75 | HR 74 | Temp 98.4°F | Resp 20 | Ht 62.0 in | Wt 157.0 lb

## 2018-09-22 DIAGNOSIS — Z86010 Personal history of colonic polyps: Secondary | ICD-10-CM

## 2018-09-22 DIAGNOSIS — D125 Benign neoplasm of sigmoid colon: Secondary | ICD-10-CM

## 2018-09-22 DIAGNOSIS — D123 Benign neoplasm of transverse colon: Secondary | ICD-10-CM

## 2018-09-22 MED ORDER — SODIUM CHLORIDE 0.9 % IV SOLN: 500.0000 mL | Freq: Once | INTRAVENOUS | Status: DC

## 2018-09-22 NOTE — Progress Notes (Signed)
Called to room to assist during endoscopic procedure.  Patient ID and intended procedure confirmed with present staff. Received instructions for my participation in the procedure from the performing physician.  

## 2018-09-22 NOTE — Progress Notes (Signed)
Pt's states no medical or surgical changes since previsit or office visit. 

## 2018-09-22 NOTE — Progress Notes (Signed)
Notified J Nulty,CRNA of Pt's BP elevation this am 190/113.

## 2018-09-22 NOTE — Progress Notes (Signed)
A/ox3, pleased with MAC, report to RN 

## 2018-09-22 NOTE — Patient Instructions (Signed)
YOU HAD AN ENDOSCOPIC PROCEDURE TODAY AT [facility]:   Refer to the procedure report that was given to you for any specific questions about what was found during the examination.  If the procedure report does not answer your questions, please call your gastroenterologist to clarify.  If you requested that your care partner not be given the details of your procedure findings, then the procedure report has been included in a sealed envelope for you to review at your convenience later.  YOU SHOULD EXPECT: Some feelings of bloating in the abdomen. Passage of more gas than usual.  Walking can help get rid of the air that was put into your GI tract during the procedure and reduce the bloating. If you had a lower endoscopy (such as a colonoscopy or flexible sigmoidoscopy) you may notice spotting of blood in your stool or on the toilet paper. If you underwent a bowel prep for your procedure, you may not have a normal bowel movement for a few days.  Please Note:  You might notice some irritation and congestion in your nose or some drainage.  This is from the oxygen used during your procedure.  There is no need for concern and it should clear up in a day or so.  SYMPTOMS TO REPORT IMMEDIATELY:   Following lower endoscopy (colonoscopy or flexible sigmoidoscopy):  Excessive amounts of blood in the stool  Significant tenderness or worsening of abdominal pains  Swelling of the abdomen that is new, acute  Fever of 100F or higher    For urgent or emergent issues, a gastroenterologist can be reached at any hour by calling [phone].   DIET:  We do recommend a small meal at first, but then you may proceed to your regular diet.  Drink plenty of fluids but you should avoid alcoholic beverages for 24 hours.  ACTIVITY:  You should plan to take it easy for the rest of today and you should NOT DRIVE or use heavy machinery until tomorrow (because of the sedation medicines used during the test).     FOLLOW UP: Our staff will call the number listed on your records the next business day following your procedure to check on you and address any questions or concerns that you may have regarding the information given to you following your procedure. If we do not reach you, we will leave a message.  However, if you are feeling well and you are not experiencing any problems, there is no need to return our call.  We will assume that you have returned to your regular daily activities without incident.  If any biopsies were taken you will be contacted by phone or by letter within the next 1-3 weeks.  Please call us at [phone] if you have not heard about the biopsies in 3 weeks.    SIGNATURES/CONFIDENTIALITY: You and/or your care partner have signed paperwork which will be entered into your electronic medical record.  These signatures attest to the fact that that the information above on your After Visit Summary has been reviewed and is understood.  Full responsibility of the confidentiality of this discharge information lies with you and/or your care-partner.    Handouts were given to your care partner on polyps, diverticulosis, and a high fiber diet with liberal fluid intake. You may resume your current medications today. Await biopsy results. Please call if any questions or concerns.

## 2018-09-22 NOTE — Op Note (Signed)
[facility] Patient Name: [name] Procedure Date: 09/22/2018 8:08 AM MRN: [id number] Endoscopist: Ladene Artist , MD Age: 78 Referring MD:  Date of Birth: April 17, 1941 Gender: Female Account #: [id number] Procedure:                Colonoscopy Indications:              Surveillance: Personal history of adenomatous                            polyps on last colonoscopy > 5 years ago Medicines:                Monitored Anesthesia Care Procedure:                Pre-Anesthesia Assessment:                           - Prior to the procedure, a History and Physical                            was performed, and patient medications and                            allergies were reviewed. The patient's tolerance of                            previous anesthesia was also reviewed. The risks                            and benefits of the procedure and the sedation                            options and risks were discussed with the patient.                            All questions were answered, and informed consent                            was obtained. Prior Anticoagulants: The patient has                            taken no previous anticoagulant or antiplatelet                            agents. ASA Grade Assessment: II - A patient with                            mild systemic disease. After reviewing the risks                            and benefits, the patient was deemed in                            satisfactory condition to undergo the procedure.  After obtaining informed consent, the colonoscope                            was passed under direct vision. Throughout the                            procedure, the patient's blood pressure, pulse, and                            oxygen saturations were monitored continuously. The                            Colonoscope was introduced through the anus and                            advanced to the the  cecum, identified by                            appendiceal orifice and ileocecal valve. The                            ileocecal valve, appendiceal orifice, and rectum                            were photographed. The quality of the bowel                            preparation was good. The colonoscopy was performed                            without difficulty. The patient tolerated the                            procedure well. Scope In: 8:12:15 AM Scope Out: 8:25:35 AM Scope Withdrawal Time: 0 hours 11 minutes 38 seconds  Total Procedure Duration: 0 hours 13 minutes 20 seconds  Findings:                 The perianal and digital rectal examinations were                            normal.                           Three sessile polyps were found in the sigmoid                            colon (1) and transverse colon (2). The polyps were                            6 to 7 mm in size. These polyps were removed with a                            cold snare. Resection and retrieval were complete.  Multiple small-mouthed diverticula were found in                            the sigmoid colon.                           The exam was otherwise without abnormality on                            direct and retroflexion views. Complications:            No immediate complications. Estimated blood loss:                            None. Estimated Blood Loss:     Estimated blood loss: none. Impression:               - Three 6 to 7 mm polyps in the sigmoid colon and                            in the transverse colon, removed with a cold snare.                            Resected and retrieved.                           - Mild diverticulosis in the sigmoid colon.                           - The examination was otherwise normal on direct                            and retroflexion views. Recommendation:           - Patient has a contact number available for                             emergencies. The signs and symptoms of potential                            delayed complications were discussed with the                            patient. Return to normal activities tomorrow.                            Written discharge instructions were provided to the                            patient.                           - High fiber diet.                           - Continue present medications.                           -  Await pathology results.                           - No repeat colonoscopy due to age. Ladene Artist, MD 09/22/2018 8:29:14 AM This report has been signed electronically.

## 2018-09-23 ENCOUNTER — Telehealth: Payer: Self-pay

## 2018-09-23 NOTE — Telephone Encounter (Signed)
  Follow up Call-  Call back number 09/22/2018  Post procedure Call Back phone  # [phone]  Permission to leave phone message Yes  Some recent data might be hidden     Patient questions:  Do you have a fever, pain , or abdominal swelling? No. Pain Score  0 *  Have you tolerated food without any problems? Yes.    Have you been able to return to your normal activities? Yes.    Do you have any questions about your discharge instructions: Diet   No. Medications  No. Follow up visit  No.  Do you have questions or concerns about your Care? No.  Actions: * If pain score is 4 or above: No action needed, pain <4.

## 2018-09-30 ENCOUNTER — Encounter: Payer: Self-pay | Admitting: Gastroenterology

## 2018-09-30 ENCOUNTER — Encounter: Payer: Self-pay | Admitting: Family Medicine

## 2018-09-30 DIAGNOSIS — Z86010 Personal history of colonic polyps: Secondary | ICD-10-CM | POA: Insufficient documentation

## 2018-09-30 DIAGNOSIS — Z860101 Personal history of adenomatous and serrated colon polyps: Secondary | ICD-10-CM | POA: Insufficient documentation

## 2018-10-30 ENCOUNTER — Ambulatory Visit: Payer: Medicare Other | Admitting: Family Medicine

## 2018-11-05 ENCOUNTER — Telehealth: Payer: Self-pay

## 2018-11-05 NOTE — Telephone Encounter (Signed)
Noted  

## 2018-11-05 NOTE — Telephone Encounter (Signed)
Copied from [place] [id number]. Topic: General - Other >> Oct 30, 2018  9:04 AM Judyann Munson wrote: Reason for CRM:  patient called and reschedule her appt for 11-17-2018 with Dr. Yong Channel due to the  weather warning, she is requesting to have a  full lab order in place before her appt with Dr.Hunter. please advise.     Spoke to pt and advised her that Dr. Yong Channel will review her chart and see which labs needed to be ordered. Pt verbalized understanding.

## 2018-11-05 NOTE — Telephone Encounter (Signed)
Left detailed message informing pt of update. 

## 2018-11-05 NOTE — Telephone Encounter (Signed)
Had full bloodwork in July- we need to sit down to discuss which labs should be done- I would not have her do labs beforehand

## 2018-11-17 ENCOUNTER — Encounter: Payer: Self-pay | Admitting: Family Medicine

## 2018-11-17 ENCOUNTER — Ambulatory Visit: Payer: Medicare Other | Admitting: Family Medicine

## 2018-11-17 VITALS — BP 122/82 | HR 86 | Temp 98.4°F | Ht 62.0 in | Wt 158.0 lb

## 2018-11-17 DIAGNOSIS — E039 Hypothyroidism, unspecified: Secondary | ICD-10-CM

## 2018-11-17 DIAGNOSIS — E782 Mixed hyperlipidemia: Secondary | ICD-10-CM | POA: Diagnosis not present

## 2018-11-17 DIAGNOSIS — I10 Essential (primary) hypertension: Secondary | ICD-10-CM | POA: Diagnosis not present

## 2018-11-17 DIAGNOSIS — E559 Vitamin D deficiency, unspecified: Secondary | ICD-10-CM | POA: Diagnosis not present

## 2018-11-17 NOTE — Progress Notes (Signed)
Phone [phone]   Subjective:  [name] is a 78 y.o. year old very pleasant female patient who presents for/with See problem oriented charting ROS- No chest pain or shortness of breath. No headache. Notes  dry eyes.   Past Medical History-  Patient Active Problem List   Diagnosis Date Noted  . Grieving 02/18/2017    Priority: High  . Osteoporosis 11/05/2016    Priority: High  . Primary open-angle glaucoma 11/20/2017    Priority: Medium  . Hypothyroidism 07/01/2013    Priority: Medium  . Essential hypertension 11/02/2012    Priority: Medium  . Hyperlipidemia, mixed 07/24/2011    Priority: Medium  . 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 Medicare annual wellness visit, subsequent 09/26/2014    Priority: Low  . Vitamin D deficiency 11/02/2012    Priority: Low  . Obesity (BMI 30-39.9) 07/24/2011    Priority: Low  . History of adenomatous polyp of colon 09/30/2018  . Pseudophakia of both eyes 11/20/2017    Medications- reviewed and updated Current Outpatient Medications  Medication Sig Dispense Refill  . AMBULATORY NON FORMULARY MEDICATION Take 180 mg by mouth daily. Medication Name: 180 mg bempedoic acid vs a placebo, CLEAR Research Study drug provided    . atropine 1 % ophthalmic solution Apply 1 drop to eye daily.    . Bimatoprost (LUMIGAN OP) Apply to eye as directed. One drop left eye at bedtime    . brimonidine (ALPHAGAN) 0.2 % ophthalmic solution Apply to eye.    . chlorthalidone (HYGROTON) 25 MG tablet TAKE (1/2) TABLET DAILY. 30 tablet 5  . Cholecalciferol (VITAMIN D-3) 5000 UNITS TABS Take 5,000 mg by mouth daily.    . Coenzyme Q10 (COQ10) 200 MG CAPS Take 1 capsule by mouth daily.    . dorzolamide-timolol (COSOPT) 22.3-6.8 MG/ML ophthalmic solution Place 1 drop into the left eye 2 (two) times daily.     Marland Kitchen ezetimibe (ZETIA) 10 MG tablet Take 1 tablet (10 mg total) by mouth daily. 90 tablet 3  .  Krill Oil 1000 MG CAPS Take by mouth. Mega Red    . levothyroxine (SYNTHROID, LEVOTHROID) 50 MCG tablet TAKE 1 TABLET ONCE DAILY. 90 tablet 3  . moxifloxacin (VIGAMOX) 0.5 % ophthalmic solution Place 1 drop into the right eye 3 (three) times daily.     . Omega-3 Fatty Acids (OMEGA III EPA+DHA PO) Take by mouth.    . pilocarpine (PILOCAR) 1 % ophthalmic solution Place into the left eye. One drop bid in left eye    . Polyethyl Glycol-Propyl Glycol (SYSTANE OP) Apply to eye as needed.     . Probiotic Product (PROBIOTIC DAILY) CAPS Take by mouth daily. Reported on 10/10/2015     No current facility-administered medications for this visit.      Objective:  BP 122/82 (BP Location: Right Arm, Patient Position: Sitting, Cuff Size: Normal)   Pulse 86   Temp 98.4 F (36.9 C) (Oral)   Ht 5\' 2"  (1.575 m)   Wt 158 lb (71.7 kg)   SpO2 99%   BMI 28.90 kg/m  Gen: NAD, resting comfortably CV: RRR no murmurs rubs or gallops Lungs: CTAB no crackles, wheeze, rhonchi Abdomen: soft/nontender/nondistended/  Overweight Ext: no edema Skin: warm, dry     Assessment and Plan   #Hypothyroidism S: Compliant with levothyroxine 50 mcg. A/P:  Stable. Continue current medications.   #Hypertension S: controlled on chlorthalidone 12.5  mg. Home average over 29 readings 132/71.  A/P:  Stable. Continue current medications.     #Hyperlipidemia S: Patient has been unable to tolerate statins.  She is on Zetia 10 mg-started last visit.  She is also in part of a research study. A/P: Update direct LDL today-hopeful for some improvement  Other notes: 1.  History of low vitamin D-we will update vitamin D today.  Patient is taking 5000 units daily-update vitamin D 2. Working through grieving process- did a few visits to hospice and found it helpful she is making some progress.  3.  Wants breast exam next visit.  She also wants a Pap smear-we discussed considering discontinuing these more strongly  Future  Appointments  Date Time Provider [place]  11/24/2018 10:00 AM [place] [facility] None   Lab/Order associations: NOT fasting Essential hypertension - Plan: CBC, Comprehensive metabolic panel  Hypothyroidism, unspecified type - Plan: TSH  Vitamin D deficiency - Plan: VITAMIN D 25 Hydroxy (Vit-D Deficiency, Fractures)  Hyperlipidemia, mixed - Plan: LDL cholesterol, direct  Return precautions advised.  Garret Reddish, MD

## 2018-11-17 NOTE — Assessment & Plan Note (Signed)
S: Patient has been unable to tolerate statins.  She is on Zetia 10 mg-started last visit.  She is also in part of a research study. A/P: Update direct LDL today-hopeful for some improvement

## 2018-11-17 NOTE — Patient Instructions (Addendum)
Thrilled you are doing so well!  No changes in meds unless labs lead [place] to make changes   Please stop by lab before you go If you do not have mychart- we will call you about results within 5 business days of [place] receiving them.  If you have mychart- we will send your results within 3 business days of [place] receiving them.  If abnormal or we want to clarify a result, we will call or mychart you to make sure you receive the message.  If you have questions or concerns or don't hear within 5-7 days, please send [place] a message or call us.

## 2018-11-18 LAB — COMPREHENSIVE METABOLIC PANEL
ALT: 18 U/L (ref 0–35)
AST: 18 U/L (ref 0–37)
Albumin: 4.7 g/dL (ref 3.5–5.2)
Alkaline Phosphatase: 43 U/L (ref 39–117)
BUN: 21 mg/dL (ref 6–23)
CO2: 25 mEq/L (ref 19–32)
Calcium: 9.6 mg/dL (ref 8.4–10.5)
Chloride: 104 mEq/L (ref 96–112)
Creatinine, Ser: 0.86 mg/dL (ref 0.40–1.20)
GFR: 63.94 mL/min (ref >60.00)
Glucose, Bld: 76 mg/dL (ref 70–99)
Potassium: 3.4 mEq/L — ABNORMAL LOW (ref 3.5–5.1)
Sodium: 142 mEq/L (ref 135–145)
Total Bilirubin: 0.7 mg/dL (ref 0.2–1.2)
Total Protein: 7 g/dL (ref 6.0–8.3)

## 2018-11-18 LAB — CBC
HCT: 43.8 % (ref 36.0–46.0)
Hemoglobin: 14.3 g/dL (ref 12.0–15.0)
MCHC: 32.6 g/dL (ref 30.0–36.0)
MCV: 89.7 fl (ref 78.0–100.0)
Platelets: 291 10*3/uL (ref 150.0–400.0)
RBC: 4.88 Mil/uL (ref 3.87–5.11)
RDW: 12.4 % (ref 11.5–15.5)
WBC: 7.8 10*3/uL (ref 4.0–10.5)

## 2018-11-18 LAB — LDL CHOLESTEROL, DIRECT: Direct LDL: 132 mg/dL

## 2018-11-18 LAB — VITAMIN D 25 HYDROXY (VIT D DEFICIENCY, FRACTURES): VITD: 47.67 ng/mL (ref 30.00–100.00)

## 2018-11-18 LAB — TSH: TSH: 1.03 u[IU]/mL (ref 0.35–4.50)

## 2018-12-29 ENCOUNTER — Other Ambulatory Visit: Payer: Self-pay

## 2018-12-29 ENCOUNTER — Other Ambulatory Visit (HOSPITAL_COMMUNITY): Payer: Medicare Other

## 2018-12-29 ENCOUNTER — Emergency Department (HOSPITAL_COMMUNITY): Payer: Medicare Other

## 2018-12-29 ENCOUNTER — Observation Stay (HOSPITAL_COMMUNITY): Payer: Medicare Other

## 2018-12-29 ENCOUNTER — Ambulatory Visit: Payer: Self-pay | Admitting: Family Medicine

## 2018-12-29 ENCOUNTER — Encounter (HOSPITAL_COMMUNITY): Payer: Self-pay

## 2018-12-29 ENCOUNTER — Observation Stay (HOSPITAL_COMMUNITY)
Admission: EM | Admit: 2018-12-29 | Discharge: 2018-12-31 | Disposition: A | Discharge status: Home / Self Care | Payer: Medicare Other | Attending: Internal Medicine | Admitting: Internal Medicine

## 2018-12-29 DIAGNOSIS — I10 Essential (primary) hypertension: Secondary | ICD-10-CM | POA: Insufficient documentation

## 2018-12-29 DIAGNOSIS — Z853 Personal history of malignant neoplasm of breast: Secondary | ICD-10-CM | POA: Diagnosis not present

## 2018-12-29 DIAGNOSIS — Z79899 Other long term (current) drug therapy: Secondary | ICD-10-CM | POA: Insufficient documentation

## 2018-12-29 DIAGNOSIS — E785 Hyperlipidemia, unspecified: Secondary | ICD-10-CM | POA: Diagnosis not present

## 2018-12-29 DIAGNOSIS — E039 Hypothyroidism, unspecified: Secondary | ICD-10-CM | POA: Diagnosis not present

## 2018-12-29 DIAGNOSIS — F419 Anxiety disorder, unspecified: Secondary | ICD-10-CM | POA: Insufficient documentation

## 2018-12-29 DIAGNOSIS — R079 Chest pain, unspecified: Secondary | ICD-10-CM | POA: Diagnosis present

## 2018-12-29 DIAGNOSIS — E669 Obesity, unspecified: Secondary | ICD-10-CM | POA: Diagnosis not present

## 2018-12-29 DIAGNOSIS — M810 Age-related osteoporosis without current pathological fracture: Secondary | ICD-10-CM | POA: Diagnosis present

## 2018-12-29 DIAGNOSIS — E559 Vitamin D deficiency, unspecified: Secondary | ICD-10-CM | POA: Diagnosis present

## 2018-12-29 DIAGNOSIS — E782 Mixed hyperlipidemia: Secondary | ICD-10-CM | POA: Diagnosis not present

## 2018-12-29 HISTORY — DX: Hypothyroidism, unspecified: E03.9

## 2018-12-29 LAB — COMPREHENSIVE METABOLIC PANEL WITH GFR
ALT: 23 U/L (ref 0–44)
AST: 24 U/L (ref 15–41)
Albumin: 4.6 g/dL (ref 3.5–5.0)
Alkaline Phosphatase: 45 U/L (ref 38–126)
Anion gap: 8 (ref 5–15)
BUN: 15 mg/dL (ref 8–23)
CO2: 27 mmol/L (ref 22–32)
Calcium: 9.9 mg/dL (ref 8.9–10.3)
Chloride: 102 mmol/L (ref 98–111)
Creatinine, Ser: 0.96 mg/dL (ref 0.44–1.00)
GFR calc Af Amer: >60 mL/min
GFR calc non Af Amer: 57 mL/min — ABNORMAL LOW
Glucose, Bld: 95 mg/dL (ref 70–99)
Potassium: 3.3 mmol/L — ABNORMAL LOW (ref 3.5–5.1)
Sodium: 137 mmol/L (ref 135–145)
Total Bilirubin: 1 mg/dL (ref 0.3–1.2)
Total Protein: 7 g/dL (ref 6.5–8.1)

## 2018-12-29 LAB — CBC
HCT: 44.2 % (ref 36.0–46.0)
Hemoglobin: 14.1 g/dL (ref 12.0–15.0)
MCH: 28.9 pg (ref 26.0–34.0)
MCHC: 31.9 g/dL (ref 30.0–36.0)
MCV: 90.6 fL (ref 80.0–100.0)
Platelets: 285 10*3/uL (ref 150–400)
RBC: 4.88 MIL/uL (ref 3.87–5.11)
RDW: 11.9 % (ref 11.5–15.5)
WBC: 7.7 10*3/uL (ref 4.0–10.5)
nRBC: 0 % (ref 0.0–0.2)

## 2018-12-29 LAB — D-DIMER, QUANTITATIVE: D-Dimer, Quant: 0.31 ug{FEU}/mL (ref 0.00–0.50)

## 2018-12-29 LAB — TROPONIN I
Troponin I: <0.03 ng/mL (ref <0.03)
Troponin I: <0.03 ng/mL
Troponin I: <0.03 ng/mL (ref <0.03)

## 2018-12-29 MED ORDER — DORZOLAMIDE HCL-TIMOLOL MAL 2-0.5 % OP SOLN
1.0000 [drp] | Freq: Two times a day (BID) | OPHTHALMIC | Status: DC
  Administered 2018-12-30 – 2018-12-31 (×3): 1 [drp] via OPHTHALMIC
  Filled 2018-12-29: qty 10

## 2018-12-29 MED ORDER — OMEGA III EPA+DHA 1000 MG PO CAPS: ORAL_CAPSULE | Freq: Every day | ORAL | Status: DC

## 2018-12-29 MED ORDER — METOPROLOL TARTRATE 25 MG PO TABS
25.0000 mg | ORAL_TABLET | Freq: Two times a day (BID) | ORAL | Status: DC
  Administered 2018-12-29 – 2018-12-31 (×3): 25 mg via ORAL
  Filled 2018-12-29 (×4): qty 1

## 2018-12-29 MED ORDER — VITAMIN D 25 MCG (1000 UNIT) PO TABS
5000.0000 [IU] | ORAL_TABLET | Freq: Every day | ORAL | Status: DC
  Administered 2018-12-30 – 2018-12-31 (×2): 5000 [IU] via ORAL
  Filled 2018-12-29 (×2): qty 5

## 2018-12-29 MED ORDER — ENOXAPARIN SODIUM 40 MG/0.4ML ~~LOC~~ SOLN
40.0000 mg | SUBCUTANEOUS | Status: DC
  Administered 2018-12-29 – 2018-12-30 (×2): 40 mg via SUBCUTANEOUS
  Filled 2018-12-29 (×2): qty 0.4

## 2018-12-29 MED ORDER — DIFLUPREDNATE 0.05 % OP EMUL: 1.0000 [drp] | Freq: Every day | OPHTHALMIC | Status: DC

## 2018-12-29 MED ORDER — ACETAMINOPHEN 325 MG PO TABS: 650.0000 mg | ORAL_TABLET | ORAL | Status: DC | PRN

## 2018-12-29 MED ORDER — EZETIMIBE 10 MG PO TABS
10.0000 mg | ORAL_TABLET | Freq: Every day | ORAL | Status: DC
  Administered 2018-12-30 – 2018-12-31 (×2): 10 mg via ORAL
  Filled 2018-12-29 (×2): qty 1

## 2018-12-29 MED ORDER — LEVOTHYROXINE SODIUM 50 MCG PO TABS
50.0000 ug | ORAL_TABLET | Freq: Every day | ORAL | Status: DC
  Administered 2018-12-30 – 2018-12-31 (×2): 50 ug via ORAL
  Filled 2018-12-29 (×2): qty 1

## 2018-12-29 MED ORDER — PREDNISOLONE ACETATE 1 % OP SUSP
1.0000 [drp] | Freq: Every day | OPHTHALMIC | Status: DC
  Administered 2018-12-30 – 2018-12-31 (×2): 1 [drp] via OPHTHALMIC
  Filled 2018-12-29: qty 5

## 2018-12-29 MED ORDER — ASPIRIN 81 MG PO CHEW
81.0000 mg | CHEWABLE_TABLET | Freq: Every day | ORAL | Status: DC
  Administered 2018-12-30 – 2018-12-31 (×2): 81 mg via ORAL
  Filled 2018-12-29 (×2): qty 1

## 2018-12-29 MED ORDER — OMEGA-3-ACID ETHYL ESTERS 1 G PO CAPS
1.0000 g | ORAL_CAPSULE | Freq: Every day | ORAL | Status: DC
  Administered 2018-12-30 – 2018-12-31 (×2): 1 g via ORAL
  Filled 2018-12-29 (×2): qty 1

## 2018-12-29 MED ORDER — IOHEXOL 350 MG/ML SOLN
89.0000 mL | Freq: Once | INTRAVENOUS | Status: AC | PRN
  Administered 2018-12-29: 89 mL via INTRAVENOUS

## 2018-12-29 MED ORDER — CHLORTHALIDONE 25 MG PO TABS
25.0000 mg | ORAL_TABLET | Freq: Every day | ORAL | Status: DC
  Administered 2018-12-30 – 2018-12-31 (×2): 25 mg via ORAL
  Filled 2018-12-29 (×2): qty 1

## 2018-12-29 MED ORDER — BIMATOPROST 0.01 % OP SOLN
1.0000 [drp] | Freq: Every day | OPHTHALMIC | Status: DC
  Administered 2018-12-29 – 2018-12-30 (×2): 1 [drp] via OPHTHALMIC
  Filled 2018-12-29: qty 2.5

## 2018-12-29 MED ORDER — POTASSIUM CHLORIDE CRYS ER 20 MEQ PO TBCR
40.0000 meq | EXTENDED_RELEASE_TABLET | Freq: Once | ORAL | Status: AC
  Administered 2018-12-29: 21:00:00 40 meq via ORAL
  Filled 2018-12-29: qty 2

## 2018-12-29 MED ORDER — ONDANSETRON HCL 4 MG/2ML IJ SOLN: 4.0000 mg | Freq: Four times a day (QID) | INTRAMUSCULAR | Status: DC | PRN

## 2018-12-29 MED ORDER — POLYETHYL GLYCOL-PROPYL GLYCOL 0.4-0.3 % OP GEL: OPHTHALMIC | Status: DC | PRN

## 2018-12-29 MED ORDER — METOPROLOL TARTRATE 25 MG PO TABS
12.5000 mg | ORAL_TABLET | Freq: Once | ORAL | Status: AC
  Administered 2018-12-29: 17:00:00 12.5 mg via ORAL
  Filled 2018-12-29: qty 1

## 2018-12-29 MED ORDER — POLYVINYL ALCOHOL 1.4 % OP SOLN
1.0000 [drp] | OPHTHALMIC | Status: DC | PRN
  Filled 2018-12-29: qty 15

## 2018-12-29 MED ORDER — PILOCARPINE HCL 1 % OP SOLN
1.0000 [drp] | Freq: Two times a day (BID) | OPHTHALMIC | Status: DC
  Administered 2018-12-30 – 2018-12-31 (×3): 1 [drp] via OPHTHALMIC
  Filled 2018-12-29: qty 15

## 2018-12-29 NOTE — ED Notes (Signed)
Patient transported to CT 

## 2018-12-29 NOTE — ED Notes (Signed)
ED TO INPATIENT HANDOFF REPORT  ED Nurse Name and Phone #: [phone]  S Name/Age/Gender [name] 78 y.o. female Room/Bed: 024C/024C  Code Status   Code Status: Full Code  Home/SNF/Other Home Patient oriented to: self, place, time and situation Is this baseline? Yes   Triage Complete: Triage complete  Chief Complaint cp,hbp  Triage Note Pt here for central chest pain with radiation to back and increased burping since Friday. Pt's bp was high today at home and she called her PCP who told her to come here. Pt sts she has been under a lot of stress and recently lost her husband. Took one baby aspirin PTA.    Allergies Allergies  Allergen Reactions  . Livalo [Pitavastatin]     ENTERED IN ERROR  . Adhesive [Tape] Other (See Comments)    Causes skin to turn red where touched  . Rosuvastatin Other (See Comments)    Joint & muscle aches, sleep issues.    Level of Care/Admitting Diagnosis ED Disposition    ED Disposition Condition [facility]: [place] [[id number]]  Level of Care: Telemetry Cardiac [103]  Covid Evaluation: N/A  Diagnosis: Chest pain [856314]  Admitting Physician: Anda Latina [[id number]]  Attending Physician: Anda Latina [phone]  PT Class (Do Not Modify): Observation [104]  PT Acc Code (Do Not Modify): Observation [10022]       B Medical/Surgery History Past Medical History:  Diagnosis Date  . Abnormal thyroid function test 07/01/2013  . Anxiety   . Arthritis   . Cancer ([place])   . Dermatitis 11/25/2013  . Dyslipidemia   . Glaucoma 11/02/2012  . HTN (hypertension) 11/02/2012  . 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 Hyperlipidemia   . Obesity    Past Surgical History:  Procedure Laterality Date  . AUGMENTATION MAMMAPLASTY Left    05/2006  . BREAST SURGERY  10/2005   Left Mastectomy, reconstruction, reduction on right. Dr. Harlow Mares  . COLONOSCOPY    . CRYOTHERAPY      GYN for CIN III  . DILATION AND CURETTAGE OF UTERUS    . EXCISION MORTON'S NEUROMA     right foot  . EYE SURGERY Right 05/08/2018  . MASTECTOMY Left   . REDUCTION MAMMAPLASTY Right      A IV Location/Drains/Wounds Patient Lines/Drains/Airways Status   Active Line/Drains/Airways    Name:   Placement date:   Placement time:   Site:   Days:   Peripheral IV 12/29/18 Right Antecubital   12/29/18    1519    Antecubital   less than 1          Intake/Output Last 24 hours  Intake/Output Summary (Last 24 hours) at 12/29/2018 1818 Last data filed at 12/29/2018 1817 Gross per 24 hour  Intake -  Output 500 ml  Net -500 ml    Labs/Imaging Results for orders placed or performed during the hospital encounter of 12/29/18 (from the past 48 hour(s))  CBC     Status: None   Collection Time: 12/29/18  3:19 PM  Result Value Ref Range   WBC 7.7 4.0 - 10.5 K/uL   RBC 4.88 3.87 - 5.11 MIL/uL   Hemoglobin 14.1 12.0 - 15.0 g/dL   HCT 44.2 36.0 - 46.0 %   MCV 90.6 80.0 - 100.0 fL   MCH 28.9 26.0 - 34.0 pg   MCHC 31.9 30.0 - 36.0 g/dL   RDW 11.9  11.5 - 15.5 %   Platelets 285 150 - 400 K/uL   nRBC 0.0 0.0 - 0.2 %    Comment: Performed at [facility], [place] [address] [place], [place] [address]  Comprehensive metabolic panel     Status: Abnormal   Collection Time: 12/29/18  3:19 PM  Result Value Ref Range   Sodium 137 135 - 145 mmol/L   Potassium 3.3 (L) 3.5 - 5.1 mmol/L   Chloride 102 98 - 111 mmol/L   CO2 27 22 - 32 mmol/L   Glucose, Bld 95 70 - 99 mg/dL   BUN 15 8 - 23 mg/dL   Creatinine, Ser 0.96 0.44 - 1.00 mg/dL   Calcium 9.9 8.9 - 10.3 mg/dL   Total Protein 7.0 6.5 - 8.1 g/dL   Albumin 4.6 3.5 - 5.0 g/dL   AST 24 15 - 41 U/L   ALT 23 0 - 44 U/L   Alkaline Phosphatase 45 38 - 126 U/L   Total Bilirubin 1.0 0.3 - 1.2 mg/dL   GFR calc non Af Amer 57 (L) >60 mL/min   GFR calc Af Amer >60 >60 mL/min   Anion gap 8 5 - 15    Comment: Performed at [facility] [address], [place], [place] [address]  Troponin I - ONCE - STAT     Status: None   Collection Time: 12/29/18  3:19 PM  Result Value Ref Range   Troponin I <0.03 <0.03 ng/mL    Comment: Performed at [facility], [place] [address], [place], [place] [address]  D-dimer, quantitative (not at [facility])     Status: None   Collection Time: 12/29/18  3:19 PM  Result Value Ref Range   D-Dimer, Quant 0.31 0.00 - 0.50 ug/mL-FEU    Comment: (NOTE) At the manufacturer cut-off of 0.50 ug/mL FEU, this assay has been documented to exclude PE with a sensitivity and negative predictive value of 97 to 99%.  At this time, this assay has not been approved by the FDA to exclude DVT/VTE. Results should be correlated with clinical presentation. Performed at [facility], [place] [address] [place], [place] [address]    Dg Chest 2 View  Result Date: 12/29/2018 CLINICAL DATA:  Acute chest pain for 1 week EXAM: CHEST - 2 VIEW COMPARISON:  12/10/2015 FINDINGS: UPPER limits normal heart size noted. There is no evidence of focal airspace disease, pulmonary edema, suspicious pulmonary nodule/mass, pleural effusion, or pneumothorax. No acute bony abnormalities are identified. IMPRESSION: UPPER limits normal heart size without evidence of acute cardiopulmonary disease. Electronically Signed   By: Margarette Canada M.D.   On: 12/29/2018 15:19    Pending Labs Unresulted Labs (From admission, onward)    Start     Ordered   01/05/19 0500  Creatinine, serum  (enoxaparin (LOVENOX)    CrCl >/= 30 ml/min)  Weekly,   R    Comments:  while on enoxaparin therapy    12/29/18 1806   12/29/18 1830  Troponin I - Now Then Q3H  Now then every 3 hours,   TIMED     12/29/18 1806          Vitals/Pain Today's Vitals   12/29/18 1715 12/29/18 1730 12/29/18 1745 12/29/18 1805  BP: (!) 148/82 (!) 158/79 (!) 176/95   Pulse: 92 80 82 80  Resp: (!) 24 12 18 14   Temp:      TempSrc:      SpO2: 99%  100% 98% 99%  Weight:      Height:       PainSc:        Isolation Precautions No active isolations  Medications Medications  acetaminophen (TYLENOL) tablet 650 mg (has no administration in time range)  ondansetron (ZOFRAN) injection 4 mg (has no administration in time range)  enoxaparin (LOVENOX) injection 40 mg (has no administration in time range)  metoprolol tartrate (LOPRESSOR) tablet 12.5 mg (12.5 mg Oral Given 12/29/18 1700)    Mobility walks Low fall risk   Focused Assessments Cardiac Assessment Handoff:  Cardiac Rhythm: Normal sinus rhythm Lab Results  Component Value Date   TROPONINI <0.03 12/29/2018   Lab Results  Component Value Date   DDIMER 0.31 12/29/2018   Does the Patient currently have chest pain? Yes     R Recommendations: See Admitting Provider Note  Report given to:   Additional Notes:

## 2018-12-29 NOTE — Plan of Care (Signed)
  Problem: Education: Goal: Knowledge of General Education information will improve Description Including pain rating scale, medication(s)/side effects and non-pharmacologic comfort measures Outcome: Progressing   

## 2018-12-29 NOTE — Telephone Encounter (Signed)
Spoke with patient having tightness in chest,soreness when she presses on chest radiates to left shoulder.Feeling dizziness and light headedness.No nausea,no weakness.BP 175/97 P 82.Patient informed to go to ER and call before she goes,per Dr. Yong Channel.Patient voices understanding.

## 2018-12-29 NOTE — Telephone Encounter (Signed)
See note

## 2018-12-29 NOTE — ED Notes (Signed)
Patient transported to X-ray 

## 2018-12-29 NOTE — H&P (Signed)
History and Physical    [name] LKG:[id number] DOB: November 20, 1940 DOA: 12/29/2018  Referring MD/NP/PA:   PCP: Marin Olp, MD   Patient coming from:  The patient is coming from home.  At baseline, pt is independent for most of ADL.        Chief Complaint: chest pain for 5 days  HPI: [name] is a 78 y.o. female with medical history significant of HTN, HLD, obesity, thyroidism, h/o left breast cancer s/p mastectomy and breast implant 78yo ago, presented to ED with chest pain for 5 days, pain located at mid-substernal chest, sometime radiating to her back. Aching/soreness, comes and goes, 8/10 when severe. Appears to be better with lying and rest, worse with movement.  Not associated with SOB, headache, dizziness, diaphoresis, nausea or vomit. She also c/o left arm pain but not associated with her chest pain. she had an episode today in am, called her PCP, recommend to go to ED.  She took asa 324mg  prior to ED.   ED Course: pt was found to have elevated BP systolic at 664Q-034V, labs K 3.3, troponin negative, EKG no ischemic changes, d-dimer negative. CXR negative. Pt was given metoprolol 12.5mg . Hospitalist is called for admission.  Review of Systems:   General: no fevers, chills, no body weight gain HEENT: no blurry vision, hearing changes or sore throat Respiratory: no dyspnea, coughing, wheezing CV:  chest pain, no palpitations GI: no nausea, vomiting, abdominal pain, diarrhea, constipation GU: no dysuria, burning on urination, increased urinary frequency, hematuria  Ext: no leg edema, left arm pain Neuro: no unilateral weakness, numbness, or tingling, no vision change or hearing loss Skin: no rash, no skin tear. MSK: No muscle spasm, no deformity, no limitation of range of movement in spin Heme: No easy bruising.  Travel history: No recent long distant travel.  Allergy:  Allergies  Allergen Reactions  . Adhesive [Tape] Other (See Comments)    Causes skin to  turn red where touched  . Rosuvastatin Other (See Comments)    Joint & muscle aches, sleep issues.    Past Medical History:  Diagnosis Date  . Abnormal thyroid function test 07/01/2013  . Anxiety   . Arthritis   . Cancer ([place])   . Dermatitis 11/25/2013  . Dyslipidemia   . Glaucoma 11/02/2012  . HTN (hypertension) 11/02/2012  . 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 Hyperlipidemia   . Obesity     Past Surgical History:  Procedure Laterality Date  . AUGMENTATION MAMMAPLASTY Left    05/2006  . BREAST SURGERY  10/2005   Left Mastectomy, reconstruction, reduction on right. Dr. Harlow Mares  . COLONOSCOPY    . CRYOTHERAPY     GYN for CIN III  . DILATION AND CURETTAGE OF UTERUS    . EXCISION MORTON'S NEUROMA     right foot  . EYE SURGERY Right 05/08/2018  . MASTECTOMY Left   . REDUCTION MAMMAPLASTY Right     Social History:  reports that she has never smoked. She has never used smokeless tobacco. She reports current alcohol use. She reports that she does not use drugs.  Family History:  Family History  Problem Relation Age of Onset  . Pneumonia Mother   . Hyperlipidemia Mother   . Parkinsonism Mother   . Other Father        bilateral subdural hematoma  . Colon cancer Paternal Grandmother   . Hyperlipidemia Sister   . Prostate  cancer Neg Hx   . Breast cancer Neg Hx   . Heart disease Neg Hx   . Diabetes Neg Hx      Prior to Admission medications   Medication Sig Start Date End Date Taking? Authorizing Provider  AMBULATORY NON FORMULARY MEDICATION Take 180 mg by mouth daily. Medication Name: 180 mg bempedoic acid vs a placebo, CLEAR Research Study drug provided 11/01/16  Yes Crenshaw, Denice Bors, MD  Bimatoprost (LUMIGAN OP) Place 1 drop into both eyes at bedtime.    Yes [provider]  dorzolamide-timolol (COSOPT) 22.3-6.8 MG/ML ophthalmic solution Place 1 drop into the left eye 2 (two) times daily.    Yes [provider]  DUREZOL 0.05 % EMUL Place 1 drop into the right eye daily. 12/12/18  Yes [provider]  atropine 1 % ophthalmic solution Apply 1 drop to eye daily. 05/09/18   [provider]  chlorthalidone (HYGROTON) 25 MG tablet TAKE (1/2) TABLET DAILY. Patient taking differently: Take 12.5 mg by mouth daily.  02/06/18   Marin Olp, MD  Cholecalciferol (VITAMIN D-3) 5000 UNITS TABS Take 5,000 mg by mouth daily.    [provider]  Coenzyme Q10 (COQ10) 200 MG CAPS Take 1 capsule by mouth daily.    [provider]  ezetimibe (ZETIA) 10 MG tablet Take 1 tablet (10 mg total) by mouth daily. 05/05/18   Marin Olp, MD  Krill Oil 1000 MG CAPS Take 1,000 mg by mouth daily. Mega Red     [provider]  levothyroxine (SYNTHROID, LEVOTHROID) 50 MCG tablet TAKE 1 TABLET ONCE DAILY. Patient taking differently: Take 50 mcg by mouth daily before breakfast.  07/22/18   Marin Olp, MD  moxifloxacin (VIGAMOX) 0.5 % ophthalmic solution Place 1 drop into the right eye 3 (three) times daily.  03/25/18   [provider]  Omega-3 Fatty Acids (OMEGA III EPA+DHA PO) Take 1 capsule by mouth.     [provider]  pilocarpine (PILOCAR) 1 % ophthalmic solution Place into the left eye. One drop bid in left eye 11/20/17   [provider]  Polyethyl Glycol-Propyl Glycol (SYSTANE OP) Apply to eye as needed.     [provider]  Probiotic Product (PROBIOTIC DAILY) CAPS Take 1 capsule by mouth daily. Reported on 10/10/2015    [provider]    Physical Exam: Vitals:   12/29/18 1715 12/29/18 1730 12/29/18 1745 12/29/18 1805  BP: (!) 148/82 (!) 158/79 (!) 176/95   Pulse: 92 80 82 80  Resp: (!) 24 12 18 14   Temp:      TempSrc:      SpO2: 99% 100% 98% 99%  Weight:      Height:       General: Not in acute distress HEENT:       Eyes: PERRL, EOMI, no scleral icterus.       ENT: No discharge from the ears and nose, no pharynx  injection, no tonsillar enlargement.        Neck: No JVD, no bruit, no mass felt. Heme: No neck lymph node enlargement. Cardiac: S1/S2, RRR, No murmurs, No gallops or rubs. Respiratory: Good air movement bilaterally. No rales, wheezing, rhonchi or rubs. GI: Soft, nondistended, nontender, no rebound pain, no organomegaly, BS present. GU: No hematuria Ext: No pitting leg edema bilaterally.  Musculoskeletal: No joint deformities, No joint redness or warmth, no limitation of ROM in spin. Skin: No rashes.  Neuro: Alert, oriented X3, cranial nerves  II-XII grossly intact, moves all extremities normally. Muscle strength 5/5 in all extremities, sensation to light touch intact.  Psych: Patient is not psychotic, no suicidal or hemocidal ideation.  Labs on Admission: I have personally reviewed following labs and imaging studies  CBC: Recent Labs  Lab 12/29/18 1519  WBC 7.7  HGB 14.1  HCT 44.2  MCV 90.6  PLT 440   Basic Metabolic Panel: Recent Labs  Lab 12/29/18 1519  NA 137  K 3.3*  CL 102  CO2 27  GLUCOSE 95  BUN 15  CREATININE 0.96  CALCIUM 9.9   GFR: Estimated Creatinine Clearance: 45.8 mL/min (by C-G formula based on SCr of 0.96 mg/dL). Liver Function Tests: Recent Labs  Lab 12/29/18 1519  AST 24  ALT 23  ALKPHOS 45  BILITOT 1.0  PROT 7.0  ALBUMIN 4.6   No results for input(s): LIPASE, AMYLASE in the last 168 hours. No results for input(s): AMMONIA in the last 168 hours. Coagulation Profile: No results for input(s): INR, PROTIME in the last 168 hours. Cardiac Enzymes: Recent Labs  Lab 12/29/18 1519 12/29/18 1817  TROPONINI <0.03 <0.03   BNP (last 3 results) No results for input(s): PROBNP in the last 8760 hours. HbA1C: No results for input(s): HGBA1C in the last 72 hours. CBG: No results for input(s): GLUCAP in the last 168 hours. Lipid Profile: No results for input(s): CHOL, HDL, LDLCALC, TRIG, CHOLHDL, LDLDIRECT in the last 72 hours. Thyroid Function  Tests: No results for input(s): TSH, T4TOTAL, FREET4, T3FREE, THYROIDAB in the last 72 hours. Anemia Panel: No results for input(s): VITAMINB12, FOLATE, FERRITIN, TIBC, IRON, RETICCTPCT in the last 72 hours. Urine analysis:    Component Value Date/Time   COLORURINE YELLOW 09/21/2014 [place] 09/21/2014 1541   LABSPEC 1.010 09/21/2014 1541   PHURINE 6.0 09/21/2014 1541   GLUCOSEU NEGATIVE 09/21/2014 1541   HGBUR NEGATIVE 09/21/2014 1541   BILIRUBINUR Negative 03/31/2018 1300   KETONESUR TRACE (A) 09/21/2014 1541   PROTEINUR Negative 03/31/2018 1300   UROBILINOGEN 0.2 03/31/2018 1300   UROBILINOGEN 0.2 09/21/2014 1541   NITRITE Negative 03/31/2018 1300   NITRITE NEGATIVE 09/21/2014 1541   LEUKOCYTESUR Negative 03/31/2018 1300   Sepsis Labs: @LABRCNTIP (procalcitonin:4,lacticidven:4) )No results found for this or any previous visit (from the past 240 hour(s)).   Radiological Exams on Admission: Dg Chest 2 View  Result Date: 12/29/2018 CLINICAL DATA:  Acute chest pain for 1 week EXAM: CHEST - 2 VIEW COMPARISON:  12/10/2015 FINDINGS: UPPER limits normal heart size noted. There is no evidence of focal airspace disease, pulmonary edema, suspicious pulmonary nodule/mass, pleural effusion, or pneumothorax. No acute bony abnormalities are identified. IMPRESSION: UPPER limits normal heart size without evidence of acute cardiopulmonary disease. Electronically Signed   By: Margarette Canada M.D.   On: 12/29/2018 15:19    EKG: Independently reviewed.  Normal sinus rhythm with rate 83, no acute ischemic changes.  Assessment/Plan Principal Problem:   Chest pain Active Problems:   Hyperlipidemia, mixed   Obesity (BMI 30-39.9)   Essential hypertension   Vitamin D deficiency   Hypothyroidism   Osteoporosis  SALINDA SNEDEKER is a 78 y.o. female with medical history significant of HTN, HLD, obesity, thyroidism, h/o left breast cancer s/p mastectomy and breast implant 78yo ago,  presented with chest pain for 5 days   Chest pain to rule out ACS Pt presented with midsternal chest pain CAD risk factors: age, HTN, HLD, obesity Troponin negative X2, EKG no acute ischemic changes.  Plan: CTA to rule out dissection, since she c/o pain radiating to back Admit to tele for observation Trend troponin and repeat EKG if further chest pain Cardiology consult for stress test  Essential HTN: Uncontrolled Home only on chlorthalidone Gave metoprolol in ED, will continue Hydralazine prn. Monitor and adjust medications accordingly. Low Sodium diet.  Hypokalemia K 3.3, likely due to diuretics Replete and recheck in am  Hypothyroidism Continue Synthroid  Hyperlipidemia Continue Zetia  DVT ppx: SQ Lovenox Code Status: Full code Family Communication: None at bed side.           Disposition Plan:  Anticipate discharge back to previous home environment Consults called:   Admission status: Obs / tele    Date of Service 12/29/2018    [facility]   If 7PM-7AM, please contact night-coverage [email or web address] Password [facility] 12/29/2018, 7:38 PM

## 2018-12-29 NOTE — ED Triage Notes (Signed)
Pt here for central chest pain with radiation to back and increased burping since Friday. Pt's bp was high today at home and she called her PCP who told her to come here. Pt sts she has been under a lot of stress and recently lost her husband. Took one baby aspirin PTA.

## 2018-12-29 NOTE — Telephone Encounter (Signed)
Pt. Reports she started having chest this past Thursday. Center of her chest, goes into her back and under her left arm pit. Pain right now is 5/10. It scared her so she called her neighbor over who is with her now. Has some dizziness as well. Reports she is burping "a lot and I have had a lot of stress." Reports she recently lost her husband. BP is 174/102, pulse 93. Recommended ED - pt. Refuses. Wants to speak to Dr. Yong Channel. Warm transferred to [facility] in the practice.  Answer Assessment - Initial Assessment Questions 1. LOCATION: "Where does it hurt?"       Center of chest 2. RADIATION: "Does the pain go anywhere else?" (e.g., into neck, jaw, arms, back)     Through to her back and sometimes under left arm pit 3. ONSET: "When did the chest pain begin?" (Minutes, hours or days)      Thursday 4. PATTERN "Does the pain come and go, or has it been constant since it started?"  "Does it get worse with exertion?"      Constant 5. DURATION: "How long does it last" (e.g., seconds, minutes, hours)     For a few days 6. SEVERITY: "How bad is the pain?"  (e.g., Scale 1-10; mild, moderate, or severe)    - MILD (1-3): doesn't interfere with normal activities     - MODERATE (4-7): interferes with normal activities or awakens from sleep    - SEVERE (8-10): excruciating pain, unable to do any normal activities       Varies 5-6 7. CARDIAC RISK FACTORS: "Do you have any history of heart problems or risk factors for heart disease?" (e.g., prior heart attack, angina; high blood pressure, diabetes, being overweight, high cholesterol, smoking, or strong family history of heart disease)     High cholesterol 8. PULMONARY RISK FACTORS: "Do you have any history of lung disease?"  (e.g., blood clots in lung, asthma, emphysema, birth control pills)     No 9. CAUSE: "What do you think is causing the chest pain?"     Unsure 10. OTHER SYMPTOMS: "Do you have any other symptoms?" (e.g., dizziness, nausea, vomiting,  sweating, fever, difficulty breathing, cough)       Dizziness, weak, anxious 11. PREGNANCY: "Is there any chance you are pregnant?" "When was your last menstrual period?"       No  Protocols used: CHEST PAIN-A-AH

## 2018-12-29 NOTE — ED Provider Notes (Signed)
[place] EMERGENCY DEPARTMENT Provider Note   CSN: [id number] Arrival date & time: 12/29/18  1414    History   Chief Complaint Chief Complaint  Patient presents with  . Chest Pain  . Hypertension    HPI [name] is a 78 y.o. female.     HPI  Chest since this past Thursday. Center of her chest, goes into her back and under her left arm pit. Pain right now is 5/10.tightness in chest,soreness when she presses on chest radiates to left shoulder.Feeling dizziness and light headedness.No nausea,no weakness. Hx of hyperlipidemia without CAD or HTN.  No pulmonary disease that she is aware of.  Took a full-strength aspirin prior to arrival.  Patient has some anxiety because her husband died 1 year ago and this would have been his birthday.  The patient denies any fevers, cough, changes in bowel or bladder habits. Past Medical History:  Diagnosis Date  . Abnormal thyroid function test 07/01/2013  . Anxiety   . Arthritis   . Cancer ([place])   . Dermatitis 11/25/2013  . Dyslipidemia   . Glaucoma 11/02/2012  . HTN (hypertension) 11/02/2012  . 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 Hyperlipidemia   . Obesity     Patient Active Problem List   Diagnosis Date Noted  . History of adenomatous polyp of colon 09/30/2018  . Primary open-angle glaucoma 11/20/2017  . Pseudophakia of both eyes 11/20/2017  . Grieving 02/18/2017  . Osteoporosis 11/05/2016  . Medicare annual wellness visit, subsequent 09/26/2014  . Hypothyroidism 07/01/2013  . Essential hypertension 11/02/2012  . Vitamin D deficiency 11/02/2012  . Hyperlipidemia, mixed 07/24/2011  . Obesity (BMI 30-39.9) 07/24/2011  . hx: breast cancer, IDC (Microinvasive) w DCIS, 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 - 07/10/2011    Past Surgical History:  Procedure Laterality Date  . AUGMENTATION MAMMAPLASTY Left    05/2006  . BREAST SURGERY  10/2005   Left Mastectomy, reconstruction,  reduction on right. Dr. Harlow Mares  . COLONOSCOPY    . CRYOTHERAPY     GYN for CIN III  . DILATION AND CURETTAGE OF UTERUS    . EXCISION MORTON'S NEUROMA     right foot  . EYE SURGERY Right 05/08/2018  . MASTECTOMY Left   . REDUCTION MAMMAPLASTY Right      OB History   No obstetric history on file.      Home Medications    Prior to Admission medications   Medication Sig Start Date End Date Taking? Authorizing Provider  AMBULATORY NON FORMULARY MEDICATION Take 180 mg by mouth daily. Medication Name: 180 mg bempedoic acid vs a placebo, CLEAR Research Study drug provided 11/01/16   Lelon Perla, MD  atropine 1 % ophthalmic solution Apply 1 drop to eye daily. 05/09/18   [provider]  Bimatoprost (LUMIGAN OP) Apply to eye as directed. One drop left eye at bedtime    [provider]  chlorthalidone (HYGROTON) 25 MG tablet TAKE (1/2) TABLET DAILY. 02/06/18   Marin Olp, MD  Cholecalciferol (VITAMIN D-3) 5000 UNITS TABS Take 5,000 mg by mouth daily.    [provider]  Coenzyme Q10 (COQ10) 200 MG CAPS Take 1 capsule by mouth daily.    [provider]  dorzolamide-timolol (COSOPT) 22.3-6.8 MG/ML ophthalmic solution Place 1 drop into the left eye 2 (two) times daily.     [provider]  ezetimibe (ZETIA) 10 MG tablet Take 1 tablet (10  mg total) by mouth daily. 05/05/18   Marin Olp, MD  Krill Oil 1000 MG CAPS Take by mouth. Mega Red    [provider]  levothyroxine (SYNTHROID, LEVOTHROID) 50 MCG tablet TAKE 1 TABLET ONCE DAILY. 07/22/18   Marin Olp, MD  moxifloxacin (VIGAMOX) 0.5 % ophthalmic solution Place 1 drop into the right eye 3 (three) times daily.  03/25/18   [provider]  Omega-3 Fatty Acids (OMEGA III EPA+DHA PO) Take by mouth.    [provider]  pilocarpine (PILOCAR) 1 % ophthalmic solution Place into the left eye. One drop bid in left eye 11/20/17   [provider]   Polyethyl Glycol-Propyl Glycol (SYSTANE OP) Apply to eye as needed.     [provider]  Probiotic Product (PROBIOTIC DAILY) CAPS Take by mouth daily. Reported on 10/10/2015    [provider]    Family History Family History  Problem Relation Age of Onset  . Pneumonia Mother   . Hyperlipidemia Mother   . Parkinsonism Mother   . Other Father        bilateral subdural hematoma  . Colon cancer Paternal Grandmother   . Hyperlipidemia Sister   . Prostate cancer Neg Hx   . Breast cancer Neg Hx   . Heart disease Neg Hx   . Diabetes Neg Hx     Social History Social History   Tobacco Use  . Smoking status: Never Smoker  . Smokeless tobacco: Never Used  Substance Use Topics  . Alcohol use: Yes    Comment: social, rare  . Drug use: No     Allergies   Livalo [pitavastatin]; Adhesive [tape]; and Rosuvastatin   Review of Systems Review of Systems  Constitutional: Negative for activity change, appetite change, chills and fever.  HENT: Negative for ear pain and sore throat.   Eyes: Negative for pain and visual disturbance.  Respiratory: Positive for chest tightness. Negative for cough and shortness of breath.   Cardiovascular: Positive for chest pain. Negative for palpitations.  Gastrointestinal: Negative for abdominal pain and vomiting.  Genitourinary: Negative for dysuria and hematuria.  Musculoskeletal: Negative for arthralgias and back pain.  Skin: Negative for color change and rash.  Neurological: Negative for seizures and syncope.  All other systems reviewed and are negative.    Physical Exam Updated Vital Signs BP (!) 172/79   Pulse 79   Temp 97.9 F (36.6 C) (Oral)   Resp 19   Ht 5' 2.5" (1.588 m)   Wt 70.8 kg   SpO2 98%   BMI 28.08 kg/m   Physical Exam Vitals signs and nursing note reviewed.  Constitutional:      General: She is not in acute distress.    Appearance: She is well-developed.  HENT:     Head: Normocephalic and  atraumatic.  Eyes:     Conjunctiva/sclera: Conjunctivae normal.  Neck:     Musculoskeletal: Neck supple.  Cardiovascular:     Rate and Rhythm: Normal rate and regular rhythm.     Heart sounds: No murmur.  Pulmonary:     Effort: Pulmonary effort is normal. No respiratory distress.     Breath sounds: Normal breath sounds.  Chest:     Chest wall: Tenderness present. No edema.     Comments: Normal tenderness over the lateral left-sided chest wall behind prosthetic implant.  Implant appears to be clean dry and intact. Abdominal:     Palpations: Abdomen is soft.     Tenderness: There  is no abdominal tenderness.  Musculoskeletal:     Right lower leg: She exhibits no tenderness. No edema.     Left lower leg: She exhibits no tenderness. No edema.  Skin:    General: Skin is warm and dry.  Neurological:     Mental Status: She is alert.      ED Treatments / Results  Labs (all labs ordered are listed, but only abnormal results are displayed) Labs Reviewed  CBC  COMPREHENSIVE METABOLIC PANEL  TROPONIN I    EKG EKG Interpretation  Date/Time:  Monday December 29 2018 14:24:13 EDT Ventricular Rate:  83 PR Interval:    QRS Duration: 93 QT Interval:  359 QTC Calculation: 422 R Axis:   27 Text Interpretation:  Sinus rhythm No significant change was found Confirmed by Jola Schmidt [phone]) on 12/29/2018 2:40:27 PM   Radiology No results found.  Procedures Procedures (including critical care time)  Medications Ordered in ED Medications - No data to display   Initial Impression / Assessment and Plan / ED Course  I have reviewed the triage vital signs and the nursing notes.  Pertinent labs & imaging results that were available during my care of the patient were reviewed by me and considered in my medical decision making (see chart for details).        Differential diagnosis includes PE, ACS, pneumothorax, pneumonia, musculoskeletal chest pain.  Well-appearing hemodynamically  stable 78 year old woman presents to the emergency department for evaluation of several days of chest pain as noted above.  Here score of 4.  Hypertensive in the ED.  Given home dose medicine.  D-dimer WNL so unlikely PE.  Chest x-ray without signs of pneumothorax or infection.  Basic lab work appear to be at the patient's baseline.  EKG shows normal sinus rhythm without ischemic changes.  Initial troponin negative.  Given her elevated risk of major adverse cardiac event in the next 30 days the patient was admitted for further chest pain rule out.  No acute events under my care.    Final Clinical Impressions(s) / ED Diagnoses   Final diagnoses:  Chest pain, unspecified type    ED Discharge Orders    None       Andee Poles, MD 12/29/18 1607    Varney Biles, MD 12/30/18 [phone]

## 2018-12-30 ENCOUNTER — Encounter (HOSPITAL_COMMUNITY): Payer: Self-pay | Admitting: Cardiology

## 2018-12-30 ENCOUNTER — Observation Stay (HOSPITAL_BASED_OUTPATIENT_CLINIC_OR_DEPARTMENT_OTHER): Payer: Medicare Other

## 2018-12-30 ENCOUNTER — Observation Stay (HOSPITAL_COMMUNITY): Payer: Medicare Other

## 2018-12-30 DIAGNOSIS — R072 Precordial pain: Secondary | ICD-10-CM | POA: Diagnosis not present

## 2018-12-30 DIAGNOSIS — Z658 Other specified problems related to psychosocial circumstances: Secondary | ICD-10-CM

## 2018-12-30 DIAGNOSIS — E039 Hypothyroidism, unspecified: Secondary | ICD-10-CM | POA: Diagnosis not present

## 2018-12-30 DIAGNOSIS — R079 Chest pain, unspecified: Secondary | ICD-10-CM

## 2018-12-30 DIAGNOSIS — E663 Overweight: Secondary | ICD-10-CM | POA: Diagnosis not present

## 2018-12-30 DIAGNOSIS — I700 Atherosclerosis of aorta: Secondary | ICD-10-CM

## 2018-12-30 DIAGNOSIS — I10 Essential (primary) hypertension: Secondary | ICD-10-CM | POA: Diagnosis not present

## 2018-12-30 DIAGNOSIS — F419 Anxiety disorder, unspecified: Secondary | ICD-10-CM | POA: Diagnosis not present

## 2018-12-30 LAB — LIPID PANEL
Cholesterol: 212 mg/dL — ABNORMAL HIGH (ref 0–200)
HDL: 45 mg/dL (ref >40)
LDL Cholesterol: 134 mg/dL — ABNORMAL HIGH (ref 0–99)
Total CHOL/HDL Ratio: 4.7 RATIO
Triglycerides: 167 mg/dL — ABNORMAL HIGH (ref <150)
VLDL: 33 mg/dL (ref 0–40)

## 2018-12-30 LAB — BASIC METABOLIC PANEL
Anion gap: 13 (ref 5–15)
BUN: 12 mg/dL (ref 8–23)
CO2: 26 mmol/L (ref 22–32)
Calcium: 9.4 mg/dL (ref 8.9–10.3)
Chloride: 102 mmol/L (ref 98–111)
Creatinine, Ser: 0.9 mg/dL (ref 0.44–1.00)
GFR calc Af Amer: >60 mL/min (ref >60)
GFR calc non Af Amer: >60 mL/min (ref >60)
Glucose, Bld: 83 mg/dL (ref 70–99)
Potassium: 3.1 mmol/L — ABNORMAL LOW (ref 3.5–5.1)
Sodium: 141 mmol/L (ref 135–145)

## 2018-12-30 LAB — MAGNESIUM: Magnesium: 2.3 mg/dL (ref 1.7–2.4)

## 2018-12-30 LAB — ECHOCARDIOGRAM LIMITED
Height: 62.5 in
Weight: 2451.2 oz

## 2018-12-30 LAB — HEMOGLOBIN A1C
Hgb A1c MFr Bld: 5.5 % (ref 4.8–5.6)
Mean Plasma Glucose: 111.15 mg/dL

## 2018-12-30 LAB — TSH: TSH: 1.284 u[IU]/mL (ref 0.350–4.500)

## 2018-12-30 MED ORDER — NITROGLYCERIN 0.4 MG SL SUBL
0.8000 mg | SUBLINGUAL_TABLET | Freq: Once | SUBLINGUAL | Status: AC
  Administered 2018-12-30: 0.8 mg via SUBLINGUAL

## 2018-12-30 MED ORDER — NITROGLYCERIN 0.4 MG SL SUBL
SUBLINGUAL_TABLET | SUBLINGUAL | Status: AC
  Filled 2018-12-30: qty 2

## 2018-12-30 MED ORDER — SODIUM CHLORIDE 0.9% FLUSH
10.0000 mL | Freq: Two times a day (BID) | INTRAVENOUS | Status: DC
  Administered 2018-12-30 – 2018-12-31 (×2): 10 mL

## 2018-12-30 MED ORDER — IOHEXOL 350 MG/ML SOLN
80.0000 mL | Freq: Once | INTRAVENOUS | Status: AC | PRN
  Administered 2018-12-30: 80 mL via INTRAVENOUS

## 2018-12-30 MED ORDER — METOPROLOL TARTRATE 50 MG PO TABS
50.0000 mg | ORAL_TABLET | Freq: Once | ORAL | Status: AC
  Administered 2018-12-30: 50 mg via ORAL
  Filled 2018-12-30: qty 1

## 2018-12-30 MED ORDER — SODIUM CHLORIDE 0.9% FLUSH: 10.0000 mL | INTRAVENOUS | Status: DC | PRN

## 2018-12-30 NOTE — Progress Notes (Signed)
Pt will now have cardiac CTA instead of myoview today. Will add lopressor 50 mg once in addition to prior 25 mg for improved HR.

## 2018-12-30 NOTE — Progress Notes (Signed)
Cardiology Consultation:   Patient ID: CARLIN MAMONE MRN: [id number]; DOB: 08/17/1941  Admit date: 12/29/2018 Date of Consult: 12/30/2018  Primary Care Provider: Marin Olp, MD Primary Cardiologist: Dorris Carnes, MD  Primary Electrophysiologist:  None    Patient Profile:   CIELA MAHAJAN is a 78 y.o. female with a hx of hyperlipidemia and difficulty with statins, HTN, hypothyroidism, low Vit. D who is being seen today for the evaluation of chest pain with radiation to back at the request of Dr. Cyndia Skeeters.  History of Present Illness:   Ms. Abruzzo with above hx of HLD with intolerance to statin on zetia,  (she did participate in CLEAR with raptiva research trial)  and hx of lt breast cancer with mastectomy and breast implant 13 yrs ago.  Patient is a difficult historian.  However she states that she has had pain in the left breast and shoulder area radiating to her neck for 10 days.  She felt it was likely related to her previous breast surgery.  However over the past 4 days she has had pain in the substernal area.  It has been continuous but waxes and wanes.  It is not pleuritic but does increase with palpation at times.  There is no nausea, diaphoresis.  She notes some chest pain with exertion as well and also describes increased dyspnea on exertion.  No orthopnea, PND or pedal edema.  She was admitted and cardiology asked to evaluate.  EKG:  The EKG was personally reviewed and demonstrates:  SR at 9 with no acute changes from 2017 and today's EKG SB and no acute changes.   Telemetry:  Telemetry was personally reviewed and demonstrates:  SR to SB down to 54.   CXR UPPER limits normal heart size without evidence of acute cardiopulmonary disease.  CTA of chest Calcified and noncalcified plaque throughout the thoracic and abdominal aorta. No aneurysm or dissection.  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  No PE.  Probable parapelvic cysts  on the right. Parapelvic cysts versus hydronephrosis on the left. Ureter is decompressed, so if this is hydronephrosis, I would favor chronic UPJ obstruction.   Currently BP improved 135/60, K+ still low.   .   Past Medical History:  Diagnosis Date  . Abnormal thyroid function test 07/01/2013  . Anxiety   . Arthritis   . Cancer ([place])   . Dermatitis 11/25/2013  . Glaucoma 11/02/2012  . HTN (hypertension) 11/02/2012  . 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 Hyperlipidemia   . Hypothyroid   . Obesity     Past Surgical History:  Procedure Laterality Date  . AUGMENTATION MAMMAPLASTY Left    05/2006  . BREAST SURGERY  10/2005   Left Mastectomy, reconstruction, reduction on right. Dr. Harlow Mares  . COLONOSCOPY    . CRYOTHERAPY     GYN for CIN III  . DILATION AND CURETTAGE OF UTERUS    . EXCISION MORTON'S NEUROMA     right foot  . EYE SURGERY Right 05/08/2018  . REDUCTION MAMMAPLASTY Right      Home Medications:  Prior to Admission medications   Medication Sig Start Date End Date Taking? Authorizing Provider  AMBULATORY NON FORMULARY MEDICATION Take 180 mg by mouth daily. Medication Name: 180 mg bempedoic acid vs a placebo, CLEAR Research Study drug provided 11/01/16  Yes Lelon Perla, MD  aspirin EC 325 MG tablet Take 325 mg by  mouth as needed (for sudden onset of chest pain).    Yes [provider]  Bimatoprost (LUMIGAN OP) Place 1 drop into both eyes at bedtime.    Yes [provider]  chlorthalidone (HYGROTON) 25 MG tablet TAKE (1/2) TABLET DAILY. Patient taking differently: Take 12.5 mg by mouth daily.  02/06/18  Yes Marin Olp, MD  Cholecalciferol (VITAMIN D-3) 5000 UNITS TABS Take 5,000 mg by mouth daily.   Yes [provider]  Coenzyme Q10 (COQ10) 200 MG CAPS Take 200 mg by mouth 2 (two) times a week.    Yes [provider]  dorzolamide-timolol (COSOPT) 22.3-6.8 MG/ML ophthalmic solution  Place 1 drop into the left eye 2 (two) times daily.    Yes [provider]  DUREZOL 0.05 % EMUL Place 1 drop into the right eye daily. 12/12/18  Yes [provider]  ezetimibe (ZETIA) 10 MG tablet Take 1 tablet (10 mg total) by mouth daily. 05/05/18  Yes Marin Olp, MD  Krill Oil 1000 MG CAPS Take 1,000 mg by mouth daily. Mega Red    Yes [provider]  levothyroxine (SYNTHROID, LEVOTHROID) 50 MCG tablet TAKE 1 TABLET ONCE DAILY. Patient taking differently: Take 50 mcg by mouth daily before breakfast.  07/22/18  Yes Marin Olp, MD  Omega-3 Fatty Acids (OMEGA III EPA+DHA PO) Take 1 capsule by mouth daily.    Yes [provider]  pilocarpine (PILOCAR) 1 % ophthalmic solution Place 1 drop into the left eye 2 (two) times daily.   Yes [provider]  Polyethyl Glycol-Propyl Glycol (SYSTANE OP) Place 1-2 drops into both eyes as needed (for dry eyes).    Yes [provider]  Probiotic Product (PROBIOTIC DAILY) CAPS Take 1 capsule by mouth daily. Reported on 10/10/2015   Yes [provider]    Inpatient Medications: Scheduled Meds: . aspirin  81 mg Oral Daily  . bimatoprost  1 drop Both Eyes QHS  . chlorthalidone  25 mg Oral Daily  . cholecalciferol  5,000 Units Oral Daily  . dorzolamide-timolol  1 drop Left Eye BID  . enoxaparin (LOVENOX) injection  40 mg Subcutaneous Q24H  . ezetimibe  10 mg Oral Daily  . levothyroxine  50 mcg Oral Q0600  . metoprolol tartrate  25 mg Oral BID  . omega-3 acid ethyl esters  1 g Oral Daily  . pilocarpine  1 drop Left Eye BID  . prednisoLONE acetate  1 drop Right Eye Daily   Continuous Infusions:  PRN Meds: acetaminophen, ondansetron (ZOFRAN) IV, polyvinyl alcohol  Allergies:    Allergies  Allergen Reactions  . Adhesive [Tape] Other (See Comments)    Causes skin to turn red where touched  . Rosuvastatin Other (See Comments)    Joint & muscle aches, sleep issues.    Social  History:   Social History   Socioeconomic History  . Marital status: Widowed    Spouse name: Not on file  . Number of children: 1  . Years of education: Not on file  . Highest education level: Not on file  Occupational History  . Not on file  Social Needs  . Financial resource strain: Not on file  . Food insecurity:    Worry: Not on file    Inability: Not on file  . Transportation needs:    Medical: Not on file    Non-medical: Not on file  Tobacco Use  . Smoking status: Never Smoker  . Smokeless tobacco: Never Used  Substance and Sexual Activity  . Alcohol use: Yes    Comment: Rare  . Drug use: No  . Sexual activity: Not on file  Lifestyle  . Physical activity:    Days per week: Not on file    Minutes per session: Not on file  . Stress: Not on file  Relationships  . Social connections:    Talks on phone: Not on file    Gets together: Not on file    Attends religious service: Not on file    Active member of club or organization: Not on file    Attends meetings of clubs or organizations: Not on file    Relationship status: Not on file  . Intimate partner violence:    Fear of current or ex partner: No    Emotionally abused: No    Physically abused: No    Forced sexual activity: No  Other Topics Concern  . Not on file  Social History Narrative   Married 1981. 1 daughter, 2 step sons. 4 grandchildren      Oncologist      Hobbies: going out to eat, activities with church including cancer support group at church    Family History:    Family History  Problem Relation Age of Onset  . Pneumonia Mother   . Hyperlipidemia Mother   . Parkinsonism Mother   . CAD Mother   . Other Father        bilateral subdural hematoma  . Colon cancer Paternal Grandmother   . Hyperlipidemia Sister   . Prostate cancer Neg Hx   . Breast cancer Neg Hx   . Heart disease Neg Hx   . Diabetes Neg Hx      ROS:  Please see the history of present illness.  General:no colds  or fevers, no weight changes Skin:no rashes or ulcers HEENT:no blurred vision, no congestion CV:see HPI PUL:see HPI GI:no diarrhea constipation or melena, no indigestion GU:no hematuria, no dysuria MS:no joint pain, no claudication Neuro:no syncope, no lightheadedness Endo:no diabetes, + thyroid disease  All other ROS reviewed and negative.     Physical Exam/Data:   Vitals:   12/29/18 1745 12/29/18 1805 12/29/18 1944 12/30/18 0619  BP: (!) 176/95  (!) 164/83 135/60  Pulse: 82 80 84 60  Resp: 18 14    Temp:   98.2 F (36.8 C) (!) 97.5 F (36.4 C)  TempSrc:   Oral Oral  SpO2: 98% 99% 100% 96%  Weight:    69.5 kg  Height:        Intake/Output Summary (Last 24 hours) at 12/30/2018 1023 Last data filed at 12/30/2018 0600 Gross per 24 hour  Intake 120 ml  Output 500 ml  Net -380 ml   Last 3 Weights 12/30/2018 12/29/2018 11/17/2018  Weight (lbs) 153 lb 3.2 oz 156 lb 158 lb  Weight (kg) 69.491 kg 70.761 kg 71.668 kg     Body mass index is 27.57 kg/m.  General:  Well nourished, well developed, in no acute distress HEENT: normal Neck: no JVD Endocrine:  No thryomegaly Vascular: No carotid bruits; FA pulses 2+ bilaterally without bruits  Cardiac:  normal S1, S2; RRR; no murmur  Lungs:  clear to auscultation bilaterally, no wheezing, rhonchi or rales  Abd: soft, nontender, no hepatomegaly  Ext: no edema Musculoskeletal:  No deformities, BUE and BLE strength normal and equal Skin: warm and dry  Neuro:  CNs 2-12 intact, no focal abnormalities noted Psych:  Normal affect  Relevant CV Studies: Echo pending  Laboratory Data:  Chemistry Recent Labs  Lab 12/29/18 1519 12/30/18 0408  NA 137 141  K 3.3* 3.1*  CL 102 102  CO2 27 26  GLUCOSE 95 83  BUN 15 12  CREATININE 0.96 0.90  CALCIUM 9.9 9.4  GFRNONAA 57* >60  GFRAA >60 >60  ANIONGAP 8 13    Recent Labs  Lab 12/29/18 1519  PROT 7.0  ALBUMIN 4.6  AST 24  ALT 23  ALKPHOS 45  BILITOT 1.0   Hematology  Recent Labs  Lab 12/29/18 1519  WBC 7.7  RBC 4.88  HGB 14.1  HCT 44.2  MCV 90.6  MCH 28.9  MCHC 31.9  RDW 11.9  PLT 285   Cardiac Enzymes Recent Labs  Lab 12/29/18 1519 12/29/18 1817 12/29/18 2111  TROPONINI <0.03 <0.03 <0.03   DDimer  Recent Labs  Lab 12/29/18 1519  DDIMER 0.31    Radiology/Studies:  Dg Chest 2 View  Result Date: 12/29/2018 CLINICAL DATA:  Acute chest pain for 1 week EXAM: CHEST - 2 VIEW COMPARISON:  12/10/2015 FINDINGS: UPPER limits normal heart size noted. There is no evidence of focal airspace disease, pulmonary edema, suspicious pulmonary nodule/mass, pleural effusion, or pneumothorax. No acute bony abnormalities are identified. IMPRESSION: UPPER limits normal heart size without evidence of acute cardiopulmonary disease. Electronically Signed   By: Margarette Canada M.D.   On: 12/29/2018 15:19   Ct Angio Chest/abd/pel For Dissection W And/or W/wo  Result Date: 12/29/2018 CLINICAL DATA:  Chest pain EXAM: CT ANGIOGRAPHY CHEST, ABDOMEN AND PELVIS TECHNIQUE: Multidetector CT imaging through the chest, abdomen and pelvis was performed using the standard protocol during bolus administration of intravenous contrast. Multiplanar reconstructed images and MIPs were obtained and reviewed to evaluate the vascular anatomy. CONTRAST:  94mL OMNIPAQUE IOHEXOL 350 MG/ML SOLN COMPARISON:  None. FINDINGS: CTA CHEST FINDINGS Cardiovascular: Moderate aortic atherosclerosis. No aneurysm or dissection. Heart is borderline in size. No filling defects in the pulmonary arteries to suggest pulmonary emboli. Mediastinum/Nodes: No mediastinal, hilar, or axillary adenopathy. Lungs/Pleura: No confluent opacities or effusions. Musculoskeletal: Prior left breast implant. Chest wall soft tissues otherwise unremarkable. No acute bony abnormality. Review of the MIP images confirms the above findings. CTA ABDOMEN AND PELVIS FINDINGS VASCULAR Aorta: Atherosclerotic irregularity with both calcified and  noncalcified plaque. No aneurysm or dissection. Celiac: Occluded. Collateral flow noted in the splenic and hepatic arteries via the SMA. SMA: Patent without evidence of aneurysm, dissection, vasculitis or significant stenosis. Renals: Single bilaterally, patent. IMA: Patent. Inflow: Calcified and noncalcified plaque in the common and external iliac arteries bilaterally. No significant stenosis, aneurysm or dissection. Veins: No obvious venous abnormality within the limitations of this arterial phase study. Review of the MIP images confirms the above findings. NON-VASCULAR Hepatobiliary: No focal hepatic abnormality. Small layering gallstones within the gallbladder. Pancreas: No focal abnormality or ductal dilatation. Spleen: No focal abnormality.  Normal size. Adrenals/Urinary Tract: Suspect parapelvic cysts on the right and possibly the left. However, cannot exclude hydronephrosis on the left. The ureters are decompressed. If this is hydronephrosis on the left, I would favor chronic UPJ obstruction. Urinary bladder and adrenal glands unremarkable. Stomach/Bowel: Normal appendix. Stomach, large and small bowel grossly unremarkable. Lymphatic: No adenopathy Reproductive: Calcified fibroids in the uterus.  No adnexal mass. Other: No free fluid or free air. Musculoskeletal: No acute bony abnormality. Review of the MIP images confirms the above findings. IMPRESSION: Calcified and noncalcified plaque throughout the thoracic and abdominal aorta. No aneurysm or  dissection. 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 No evidence of pulmonary embolus. Probable parapelvic cysts on the right. Parapelvic cysts versus hydronephrosis on the left. Ureter is decompressed, so if this is hydronephrosis, I would favor chronic UPJ obstruction. Electronically Signed   By: Rolm Baptise M.D.   On: 12/29/2018 19:54    Assessment and Plan:   1. Chest pain-patient is a difficult historian.   Some of her symptoms are extremely atypical.  However she also describes some exertional dyspnea and chest discomfort.  She has ruled out.  Electrocardiogram without diagnostic ST changes.  Plan to proceed with Lexiscan nuclear study tomorrow for risk stratification. 2. Hyperlipidemia-intolerant to statins.  Continue clear study drug. 3. Hypertension-continue chlorthalidone and metoprolol.  Follow blood pressure and adjust regimen as needed. 4. Hypokalemia-supplement.  For questions or updates, please contact [facility] Please consult [email or web address] for contact info under     Signed, Kirk Ruths, MD  12/30/2018 10:23 AM

## 2018-12-30 NOTE — Progress Notes (Signed)
  Echocardiogram 2D Echocardiogram has been performed.  [name] 12/30/2018, 1:45 PM

## 2018-12-30 NOTE — Care Management Obs Status (Signed)
[place] NOTIFICATION   Patient Details  Name: [name] MRN: [id number] Date of Birth: [birth year]   Medicare Observation Status Notification Given:  Yes    Rane, Dumm, RN 12/30/2018, 4:31 PM

## 2018-12-30 NOTE — Progress Notes (Signed)
PROGRESS NOTE  [name] YBO:[id number] DOB: [birth year] DOA: 12/29/2018 PCP: Marin Olp, MD   LOS: 0 days   Brief Narrative / Interim history: 78 year old female with history of HTN, HLD, hypothyroidism, obesity and left breast CA s/p mastectomy and breast implant about 13 years ago presenting with intermittent chest pain for 5 days prior to admission.  Chest pain is not necessarily exertional.  However, she endorses some exertional dyspnea.  Patient is poor historian.  Of note, lately feels sad since the anniversary of her husband's death that was even made worse by COVID isolation.  In ED, SBP elevated to 160s to 170s.  K3.3.  Serial troponin negative.  EKG without acute ischemic finding.  Was given p.o. metoprolol 12.5 mg and admitted for ACS rule out.   CTA chest negative for PE or any other acute intrathoracic findings.  The next day, continues to have mild lower chest pain.  Given risk factor (Harvette score of 4) cardiology consulted and recommended echocardiogram and CCTA.  Subjective: No major events overnight of this morning.  Continues to endorse mild lower chest pain.  No radiation to her jaw or arms.  Denies dyspnea, lightheadedness, abdominal pain or urinary symptoms.  Assessment & Plan: Principal Problem:   Chest pain Active Problems:   Hyperlipidemia, mixed   Obesity (BMI 30-39.9)   Essential hypertension   Vitamin D deficiency   Hypothyroidism   Osteoporosis  Chest pain: history not convincing but patient is poor historian.  Heart score 4.  Serial troponin and EKG negative.  CTA chest negative.  Echo with EF of 60 to 65% and no significant structural functional abnormalities but limited study. -Cardiology consulted-CCTA  -Metoprolol 25 mg twice daily -PRN nitro -Continue low-dose aspirin.  Status post full dose aspirin -Cholesterol not good.  History of statin intolerance due to myalgia.  -May be a candidate for PCSK9 inhibitors -Continue home  Zetia. -A1c 5.5.  Essential hypertension-normotensive.  On chlorthalidone at home -Metoprolol 25 mg twice daily started on admission. -Continue chlorthalidone  Hypokalemia-likely due to thiazide diuretics -K-Dur 40 mg x 2 -Recheck in the morning  Overweight: A1c 28 -Encouraged lifestyle change to lose weight  History of breast cancer status post left mastectomy and breast implant 13 years ago-stable  Psychosocial-some difficulty around the anniversary of her husband's death, and COVID lock down.  No psych history. -Psychosocial support  Glaucoma -Continue home eyedrops  Hypothyroidism: TSH within normal range -Continue home Synthroid  Scheduled Meds: . aspirin  81 mg Oral Daily  . bimatoprost  1 drop Both Eyes QHS  . chlorthalidone  25 mg Oral Daily  . cholecalciferol  5,000 Units Oral Daily  . dorzolamide-timolol  1 drop Left Eye BID  . enoxaparin (LOVENOX) injection  40 mg Subcutaneous Q24H  . ezetimibe  10 mg Oral Daily  . levothyroxine  50 mcg Oral Q0600  . metoprolol tartrate  25 mg Oral BID  . omega-3 acid ethyl esters  1 g Oral Daily  . pilocarpine  1 drop Left Eye BID  . prednisoLONE acetate  1 drop Right Eye Daily  . sodium chloride flush  10-40 mL Intracatheter Q12H   Continuous Infusions: PRN Meds:.acetaminophen, ondansetron (ZOFRAN) IV, polyvinyl alcohol, sodium chloride flush  DVT prophylaxis: Subcu Lovenox Code Status: Full code Family Communication: None at bedside.  Updated patient's daughter over the phone. Disposition Plan: Remains inpatient, observation status, for further study to exclude ACS.  Consultants:   Cardiology  Procedures:   None  Antimicrobials:  Not applicable  Objective: Vitals:   12/30/18 1033 12/30/18 1118 12/30/18 1122 12/30/18 1222  BP: 120/62  (!) 131/55 133/60  Pulse: 86 88    Resp:   14 12  Temp:      TempSrc:      SpO2:      Weight:      Height:        Intake/Output Summary (Last 24 hours) at 12/30/2018  1404 Last data filed at 12/30/2018 0600 Gross per 24 hour  Intake 120 ml  Output 500 ml  Net -380 ml   Filed Weights   12/29/18 1421 12/30/18 0619  Weight: 70.8 kg 69.5 kg    Examination:  GENERAL: Appears well. No acute distress.  EYES - vision grossly intact. Sclera anicteric.  NOSE- no gross deformity or drainage MOUTH - no oral lesions noted THROAT- no swelling or erythema LUNGS:  No IWOB. Good air movement. CTAB.  HEART: RRR. Heart sounds normal.  Radial and DP pulses symmetric bilaterally ABD: Bowel sounds present. Soft. Non tender.  MSK/EXT: Moves extremities. No obvious deformity. SKIN: no apparent skin lesion.  NEURO: Awake, alert and oriented appropriately.  No gross deficit.  PSYCH: Tearful as she talks about her husband.   Data Reviewed: I have independently reviewed following labs and imaging studies  CBC: Recent Labs  Lab 12/29/18 1519  WBC 7.7  HGB 14.1  HCT 44.2  MCV 90.6  PLT 629   Basic Metabolic Panel: Recent Labs  Lab 12/29/18 1519 12/30/18 0408  NA 137 141  K 3.3* 3.1*  CL 102 102  CO2 27 26  GLUCOSE 95 83  BUN 15 12  CREATININE 0.96 0.90  CALCIUM 9.9 9.4  MG  --  2.3   GFR: Estimated Creatinine Clearance: 48.4 mL/min (by C-G formula based on SCr of 0.9 mg/dL). Liver Function Tests: Recent Labs  Lab 12/29/18 1519  AST 24  ALT 23  ALKPHOS 45  BILITOT 1.0  PROT 7.0  ALBUMIN 4.6   No results for input(s): LIPASE, AMYLASE in the last 168 hours. No results for input(s): AMMONIA in the last 168 hours. Coagulation Profile: No results for input(s): INR, PROTIME in the last 168 hours. Cardiac Enzymes: Recent Labs  Lab 12/29/18 1519 12/29/18 1817 12/29/18 2111  TROPONINI <0.03 <0.03 <0.03   BNP (last 3 results) No results for input(s): PROBNP in the last 8760 hours. HbA1C: Recent Labs    12/30/18 0817  HGBA1C 5.5   CBG: No results for input(s): GLUCAP in the last 168 hours. Lipid Profile: Recent Labs    12/30/18  0817  CHOL 212*  HDL 45  LDLCALC 134*  TRIG 167*  CHOLHDL 4.7   Thyroid Function Tests: Recent Labs    12/30/18 0817  TSH 1.284   Anemia Panel: No results for input(s): VITAMINB12, FOLATE, FERRITIN, TIBC, IRON, RETICCTPCT in the last 72 hours. Urine analysis:    Component Value Date/Time   COLORURINE YELLOW 09/21/2014 [place] 09/21/2014 1541   LABSPEC 1.010 09/21/2014 1541   PHURINE 6.0 09/21/2014 1541   GLUCOSEU NEGATIVE 09/21/2014 1541   HGBUR NEGATIVE 09/21/2014 1541   BILIRUBINUR Negative 03/31/2018 1300   KETONESUR TRACE (A) 09/21/2014 1541   PROTEINUR Negative 03/31/2018 1300   UROBILINOGEN 0.2 03/31/2018 1300   UROBILINOGEN 0.2 09/21/2014 1541   NITRITE Negative 03/31/2018 1300   NITRITE NEGATIVE 09/21/2014 1541   LEUKOCYTESUR Negative 03/31/2018 1300   Sepsis Labs: Invalid input(s): PROCALCITONIN, LACTICIDVEN  No results found for  this or any previous visit (from the past 240 hour(s)).    Radiology Studies: Dg Chest 2 View  Result Date: 12/29/2018 CLINICAL DATA:  Acute chest pain for 1 week EXAM: CHEST - 2 VIEW COMPARISON:  12/10/2015 FINDINGS: UPPER limits normal heart size noted. There is no evidence of focal airspace disease, pulmonary edema, suspicious pulmonary nodule/mass, pleural effusion, or pneumothorax. No acute bony abnormalities are identified. IMPRESSION: UPPER limits normal heart size without evidence of acute cardiopulmonary disease. Electronically Signed   By: Margarette Canada M.D.   On: 12/29/2018 15:19   Ct Angio Chest/abd/pel For Dissection W And/or W/wo  Result Date: 12/29/2018 CLINICAL DATA:  Chest pain EXAM: CT ANGIOGRAPHY CHEST, ABDOMEN AND PELVIS TECHNIQUE: Multidetector CT imaging through the chest, abdomen and pelvis was performed using the standard protocol during bolus administration of intravenous contrast. Multiplanar reconstructed images and MIPs were obtained and reviewed to evaluate the vascular anatomy. CONTRAST:   13mL OMNIPAQUE IOHEXOL 350 MG/ML SOLN COMPARISON:  None. FINDINGS: CTA CHEST FINDINGS Cardiovascular: Moderate aortic atherosclerosis. No aneurysm or dissection. Heart is borderline in size. No filling defects in the pulmonary arteries to suggest pulmonary emboli. Mediastinum/Nodes: No mediastinal, hilar, or axillary adenopathy. Lungs/Pleura: No confluent opacities or effusions. Musculoskeletal: Prior left breast implant. Chest wall soft tissues otherwise unremarkable. No acute bony abnormality. Review of the MIP images confirms the above findings. CTA ABDOMEN AND PELVIS FINDINGS VASCULAR Aorta: Atherosclerotic irregularity with both calcified and noncalcified plaque. No aneurysm or dissection. Celiac: Occluded. Collateral flow noted in the splenic and hepatic arteries via the SMA. SMA: Patent without evidence of aneurysm, dissection, vasculitis or significant stenosis. Renals: Single bilaterally, patent. IMA: Patent. Inflow: Calcified and noncalcified plaque in the common and external iliac arteries bilaterally. No significant stenosis, aneurysm or dissection. Veins: No obvious venous abnormality within the limitations of this arterial phase study. Review of the MIP images confirms the above findings. NON-VASCULAR Hepatobiliary: No focal hepatic abnormality. Small layering gallstones within the gallbladder. Pancreas: No focal abnormality or ductal dilatation. Spleen: No focal abnormality.  Normal size. Adrenals/Urinary Tract: Suspect parapelvic cysts on the right and possibly the left. However, cannot exclude hydronephrosis on the left. The ureters are decompressed. If this is hydronephrosis on the left, I would favor chronic UPJ obstruction. Urinary bladder and adrenal glands unremarkable. Stomach/Bowel: Normal appendix. Stomach, large and small bowel grossly unremarkable. Lymphatic: No adenopathy Reproductive: Calcified fibroids in the uterus.  No adnexal mass. Other: No free fluid or free air. Musculoskeletal:  No acute bony abnormality. Review of the MIP images confirms the above findings. IMPRESSION: Calcified and noncalcified plaque throughout the thoracic and abdominal aorta. No aneurysm or dissection. 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 No evidence of pulmonary embolus. Probable parapelvic cysts on the right. Parapelvic cysts versus hydronephrosis on the left. Ureter is decompressed, so if this is hydronephrosis, I would favor chronic UPJ obstruction. Electronically Signed   By: Rolm Baptise M.D.   On: 12/29/2018 19:54   Broox Lonigro T. [facility] Pager [phone]  If 7PM-7AM, please contact night-coverage [email or web address] Password [facility] 12/30/2018, 2:04 PM

## 2018-12-31 DIAGNOSIS — R079 Chest pain, unspecified: Secondary | ICD-10-CM | POA: Diagnosis not present

## 2018-12-31 DIAGNOSIS — R072 Precordial pain: Secondary | ICD-10-CM | POA: Diagnosis not present

## 2018-12-31 DIAGNOSIS — I10 Essential (primary) hypertension: Secondary | ICD-10-CM | POA: Diagnosis not present

## 2018-12-31 DIAGNOSIS — E782 Mixed hyperlipidemia: Secondary | ICD-10-CM | POA: Diagnosis not present

## 2018-12-31 LAB — BASIC METABOLIC PANEL
Anion gap: 11 (ref 5–15)
BUN: 20 mg/dL (ref 8–23)
CO2: 24 mmol/L (ref 22–32)
Calcium: 9.1 mg/dL (ref 8.9–10.3)
Chloride: 105 mmol/L (ref 98–111)
Creatinine, Ser: 1.31 mg/dL — ABNORMAL HIGH (ref 0.44–1.00)
GFR calc Af Amer: 45 mL/min — ABNORMAL LOW (ref >60)
GFR calc non Af Amer: 39 mL/min — ABNORMAL LOW (ref >60)
Glucose, Bld: 99 mg/dL (ref 70–99)
Potassium: 3.1 mmol/L — ABNORMAL LOW (ref 3.5–5.1)
Sodium: 140 mmol/L (ref 135–145)

## 2018-12-31 MED ORDER — PANTOPRAZOLE SODIUM 40 MG PO TBEC
40.0000 mg | DELAYED_RELEASE_TABLET | Freq: Every day | ORAL | Status: DC
  Administered 2018-12-31: 40 mg via ORAL
  Filled 2018-12-31: qty 1

## 2018-12-31 MED ORDER — METOPROLOL TARTRATE 25 MG PO TABS: 25.0000 mg | ORAL_TABLET | Freq: Two times a day (BID) | ORAL | 0 refills | Status: DC

## 2018-12-31 MED ORDER — PANTOPRAZOLE SODIUM 40 MG PO TBEC: 40.0000 mg | DELAYED_RELEASE_TABLET | Freq: Every day | ORAL | 0 refills | Status: DC

## 2018-12-31 MED ORDER — OMEGA-3-ACID ETHYL ESTERS 1 G PO CAPS: 1.0000 g | ORAL_CAPSULE | Freq: Every day | ORAL | 0 refills | Status: DC

## 2018-12-31 MED ORDER — ASPIRIN 81 MG PO CHEW: 81.0000 mg | CHEWABLE_TABLET | Freq: Every day | ORAL | 0 refills | Status: AC

## 2018-12-31 MED ORDER — POTASSIUM CHLORIDE CRYS ER 20 MEQ PO TBCR
60.0000 meq | EXTENDED_RELEASE_TABLET | Freq: Once | ORAL | Status: AC
  Administered 2018-12-31: 10:00:00 60 meq via ORAL
  Filled 2018-12-31: qty 3

## 2018-12-31 MED FILL — PANTOPRAZOLE SOD DR 40 MG T: 40 | 30 days supply | Qty: 30 | Fill #0

## 2018-12-31 MED FILL — METOPROLOL TARTRATE 25 MG T: 25 | 30 days supply | Qty: 60 | Fill #0

## 2018-12-31 MED FILL — ASPIRIN LOW DOSE 81 MG CHEW: 81 | 30 days supply | Qty: 30 | Fill #0

## 2018-12-31 MED FILL — OMEGA-3 ETHYL ESTER 1 GM CA: 1 | 30 days supply | Qty: 30 | Fill #0

## 2018-12-31 NOTE — Progress Notes (Addendum)
Progress Note  Patient Name: [name] Date of Encounter: 12/31/2018  Primary Cardiologist:  Dorris Carnes, MD  Subjective   Still w/ CP at times, improved by burping. No exertional component.  Feels she may have been stressed by Husband's Bday (first one since he died) and COVID  Inpatient Medications    Scheduled Meds:  aspirin  81 mg Oral Daily   bimatoprost  1 drop Both Eyes QHS   chlorthalidone  25 mg Oral Daily   cholecalciferol  5,000 Units Oral Daily   dorzolamide-timolol  1 drop Left Eye BID   enoxaparin (LOVENOX) injection  40 mg Subcutaneous Q24H   ezetimibe  10 mg Oral Daily   levothyroxine  50 mcg Oral Q0600   metoprolol tartrate  25 mg Oral BID   omega-3 acid ethyl esters  1 g Oral Daily   pilocarpine  1 drop Left Eye BID   prednisoLONE acetate  1 drop Right Eye Daily   sodium chloride flush  10-40 mL Intracatheter Q12H   Continuous Infusions:  PRN Meds: acetaminophen, ondansetron (ZOFRAN) IV, polyvinyl alcohol, sodium chloride flush   Vital Signs    Vitals:   12/30/18 1548 12/30/18 2011 12/31/18 0445 12/31/18 0448  BP: 129/83 127/72 132/62   Pulse: 67 61 64   Resp: 19  16   Temp: 97.7 F (36.5 C) 98.1 F (36.7 C) 97.8 F (36.6 C)   TempSrc: Oral Oral Oral   SpO2: 98% 98% 97%   Weight:    69.7 kg  Height:       No intake or output data in the 24 hours ending 12/31/18 0728 Filed Weights   12/29/18 1421 12/30/18 0619 12/31/18 0448  Weight: 70.8 kg 69.5 kg 69.7 kg    Telemetry    SR, No sig ectopy - Personally Reviewed  ECG    None today - Personally Reviewed  Physical Exam   General: Well developed, well nourished, female appearing in no acute distress. Head: Normocephalic, atraumatic.  Neck: Supple without bruits, JVD not elevated. Lungs:  Resp regular and unlabored, CTA. Heart: RRR, S1, S2, no S3, S4, or murmur; no rub. Abdomen: Soft, non-tender, non-distended with normoactive bowel sounds. No hepatomegaly. No  rebound/guarding. No obvious abdominal masses. Extremities: No clubbing, cyanosis, no edema. Distal pedal pulses are 2+ bilaterally. Neuro: Alert and oriented X 3. Moves all extremities spontaneously. Psych: Normal affect.  Labs    Hematology Recent Labs  Lab 12/29/18 1519  WBC 7.7  RBC 4.88  HGB 14.1  HCT 44.2  MCV 90.6  MCH 28.9  MCHC 31.9  RDW 11.9  PLT 285    Chemistry Recent Labs  Lab 12/29/18 1519 12/30/18 0408 12/31/18 0404  NA 137 141 140  K 3.3* 3.1* 3.1*  CL 102 102 105  CO2 27 26 24   GLUCOSE 95 83 99  BUN 15 12 20   CREATININE 0.96 0.90 1.31*  CALCIUM 9.9 9.4 9.1  PROT 7.0  --   --   ALBUMIN 4.6  --   --   AST 24  --   --   ALT 23  --   --   ALKPHOS 45  --   --   BILITOT 1.0  --   --   GFRNONAA 57* >60 39*  GFRAA >60 >60 45*  ANIONGAP 8 13 11      Cardiac Enzymes Recent Labs  Lab 12/29/18 1519 12/29/18 1817 12/29/18 2111  TROPONINI <0.03 <0.03 <0.03   No results for input(s):  TROPIPOC in the last 168 hours.   BNPNo results for input(s): BNP, PROBNP in the last 168 hours.   DDimer  Recent Labs  Lab 12/29/18 1519  DDIMER 0.31     Radiology    Dg Chest 2 View  Result Date: 12/29/2018 CLINICAL DATA:  Acute chest pain for 1 week EXAM: CHEST - 2 VIEW COMPARISON:  12/10/2015 FINDINGS: UPPER limits normal heart size noted. There is no evidence of focal airspace disease, pulmonary edema, suspicious pulmonary nodule/mass, pleural effusion, or pneumothorax. No acute bony abnormalities are identified. IMPRESSION: UPPER limits normal heart size without evidence of acute cardiopulmonary disease. Electronically Signed   By: Margarette Canada M.D.   On: 12/29/2018 15:19   Ct Coronary Morph W/cta Cor W/score W/ca W/cm &/or Wo/cm  Addendum Date: 12/30/2018   ADDENDUM REPORT: 12/30/2018 16:33 CLINICAL DATA:  Chest pain EXAM: Cardiac CTA MEDICATIONS: Sub lingual nitro. 4mg  x 2 and lopressor 50 mg po TECHNIQUE: The patient was scanned on a Siemens 623 slice  scanner. Gantry rotation speed was 250 msecs. Collimation was 0.6 mm. A 100 kV prospective scan was triggered in the ascending thoracic aorta at 35-75% of the R-R interval. Average HR during the scan was 60 bpm. The 3D data set was interpreted on a dedicated work station using MPR, MIP and VRT modes. A total of 80cc of contrast was used. FINDINGS: Non-cardiac: See separate report from [facility]. Pulmonary veins drain normally to the left atrium. Calcium Score: 157 Agatston units. Coronary Arteries: Right dominant with no anomalies LM: Calcified plaque, minimal stenosis. LAD system: Calcified plaque proximal LAD with mild (<50%) stenosis. Circumflex system: No plaque noted in the LCx system. There appears to be motion artifact in the mid LCx that affects interpretation of a small segment of the mid LCx. RCA system: No plaque or stenosis. IMPRESSION: 1. Coronary artery calcium score 157 Agatston units. This places the patient in the 64th percentile for age and gender, suggesting intermediate risk for future cardiac events. 2. There does not appear to be obstructive coronary disease present. There is an area in the mid LCx obscured by artifact but suspect there is not severe stenosis at this location. Dalton Mclean Electronically Signed   By: Loralie Champagne M.D.   On: 12/30/2018 16:33   Result Date: 12/30/2018 EXAM: OVER-READ INTERPRETATION  CT CHEST The following report is an over-read performed by radiologist Dr. Vinnie Langton of [facility], [place] on 12/30/2018. This over-read does not include interpretation of cardiac or coronary anatomy or pathology. The coronary calcium score/coronary CTA interpretation by the cardiologist is attached. COMPARISON:  None. FINDINGS: Aortic atherosclerosis. Within the visualized portions of the thorax there are no suspicious appearing pulmonary nodules or masses, there is no acute consolidative airspace disease, no pleural effusions, no pneumothorax and no  lymphadenopathy. Visualized portions of the upper abdomen are unremarkable. There are no aggressive appearing lytic or blastic lesions noted in the visualized portions of the skeleton. Left-sided breast implant. IMPRESSION: 1. Aortic atherosclerosis. Electronically Signed: By: Vinnie Langton M.D. On: 12/30/2018 15:15   Ct Angio Chest/abd/pel For Dissection W And/or W/wo  Result Date: 12/29/2018 CLINICAL DATA:  Chest pain EXAM: CT ANGIOGRAPHY CHEST, ABDOMEN AND PELVIS TECHNIQUE: Multidetector CT imaging through the chest, abdomen and pelvis was performed using the standard protocol during bolus administration of intravenous contrast. Multiplanar reconstructed images and MIPs were obtained and reviewed to evaluate the vascular anatomy. CONTRAST:  62mL OMNIPAQUE IOHEXOL 350 MG/ML SOLN COMPARISON:  None. FINDINGS: CTA CHEST  FINDINGS Cardiovascular: Moderate aortic atherosclerosis. No aneurysm or dissection. Heart is borderline in size. No filling defects in the pulmonary arteries to suggest pulmonary emboli. Mediastinum/Nodes: No mediastinal, hilar, or axillary adenopathy. Lungs/Pleura: No confluent opacities or effusions. Musculoskeletal: Prior left breast implant. Chest wall soft tissues otherwise unremarkable. No acute bony abnormality. Review of the MIP images confirms the above findings. CTA ABDOMEN AND PELVIS FINDINGS VASCULAR Aorta: Atherosclerotic irregularity with both calcified and noncalcified plaque. No aneurysm or dissection. Celiac: Occluded. Collateral flow noted in the splenic and hepatic arteries via the SMA. SMA: Patent without evidence of aneurysm, dissection, vasculitis or significant stenosis. Renals: Single bilaterally, patent. IMA: Patent. Inflow: Calcified and noncalcified plaque in the common and external iliac arteries bilaterally. No significant stenosis, aneurysm or dissection. Veins: No obvious venous abnormality within the limitations of this arterial phase study. Review of the MIP  images confirms the above findings. NON-VASCULAR Hepatobiliary: No focal hepatic abnormality. Small layering gallstones within the gallbladder. Pancreas: No focal abnormality or ductal dilatation. Spleen: No focal abnormality.  Normal size. Adrenals/Urinary Tract: Suspect parapelvic cysts on the right and possibly the left. However, cannot exclude hydronephrosis on the left. The ureters are decompressed. If this is hydronephrosis on the left, I would favor chronic UPJ obstruction. Urinary bladder and adrenal glands unremarkable. Stomach/Bowel: Normal appendix. Stomach, large and small bowel grossly unremarkable. Lymphatic: No adenopathy Reproductive: Calcified fibroids in the uterus.  No adnexal mass. Other: No free fluid or free air. Musculoskeletal: No acute bony abnormality. Review of the MIP images confirms the above findings. IMPRESSION: Calcified and noncalcified plaque throughout the thoracic and abdominal aorta. No aneurysm or dissection. 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 No evidence of pulmonary embolus. Probable parapelvic cysts on the right. Parapelvic cysts versus hydronephrosis on the left. Ureter is decompressed, so if this is hydronephrosis, I would favor chronic UPJ obstruction. Electronically Signed   By: Rolm Baptise M.D.   On: 12/29/2018 19:54     Cardiac Studies   ECHO:  04/21  1. The left ventricle has normal systolic function with an ejection fraction of 60-65%. The cavity size was normal. Left ventricular diastolic function could not be evaluated due to nondiagnostic images. No evidence of left ventricular regional wall  motion abnormalities.  2. The right ventricle has normal systolic function. The cavity was normal. There is no increase in right ventricular wall thickness. Right ventricular systolic pressure could not be assessed.  3. Left atrial size was not assessed.  4. Trivial pericardial effusion is present.  5. The mitral  valve is grossly normal. Mild thickening of the mitral valve leaflet.  6. The aortic valve is tricuspid. Mild calcification of the aortic valve.  SUMMARY   Normal LV function without regional wall motion abnormality. No apical views for diastology/atrial measurements.  FINDINGS  Left Ventricle: The left ventricle has normal systolic function, with an ejection fraction of 60-65%. The cavity size was normal. There is no increase in left ventricular wall thickness. Left ventricular diastolic function could not be evaluated due to  nondiagnostic images. No evidence of left ventricular regional wall motion abnormalities..  Right Ventricle: The right ventricle has normal systolic function. The cavity was normal. There is no increase in right ventricular wall thickness. Right ventricular systolic pressure could not be assessed.  Left Atrium: Left atrial size was not assessed.  Right Atrium: Right atrial size was not assessed. Right atrial pressure is estimated at 3 mmHg.  Interatrial Septum: No  atrial level shunt detected by color flow Doppler.  Pericardium: Trivial pericardial effusion is present.  Mitral Valve: The mitral valve is grossly normal. Mild thickening of the mitral valve leaflet. Mitral valve regurgitation is trivial by color flow Doppler.  Tricuspid Valve: The tricuspid valve is normal in structure. Tricuspid valve regurgitation is trivial by color flow Doppler.  Aortic Valve: The aortic valve is tricuspid Mild calcification of the aortic valve. Aortic valve regurgitation was not visualized by color flow Doppler. There is no evidence of aortic valve stenosis.  Pulmonic Valve: The pulmonic valve was grossly normal. Pulmonic valve regurgitation is not visualized by color flow Doppler.  Pulmonary Artery: The pulmonary artery is not well seen.  Radiology      Dg Chest 2 View  Result Date: 12/29/2018 CLINICAL DATA:  Acute chest pain for 1 week EXAM: CHEST - 2 VIEW  COMPARISON:  12/10/2015 FINDINGS: UPPER limits normal heart size noted. There is no evidence of focal airspace disease, pulmonary edema, suspicious pulmonary nodule/mass, pleural effusion, or pneumothorax. No acute bony abnormalities are identified. IMPRESSION: UPPER limits normal heart size without evidence of acute cardiopulmonary disease. Electronically Signed   By: Margarette Canada M.D.   On: 12/29/2018 15:19   Ct Coronary Morph W/cta Cor W/score W/ca W/cm &/or Wo/cm  Addendum Date: 12/30/2018   ADDENDUM REPORT: 12/30/2018 16:33 CLINICAL DATA:  Chest pain EXAM: Cardiac CTA MEDICATIONS: Sub lingual nitro. 4mg  x 2 and lopressor 50 mg po TECHNIQUE: The patient was scanned on a Siemens 919 slice scanner. Gantry rotation speed was 250 msecs. Collimation was 0.6 mm. A 100 kV prospective scan was triggered in the ascending thoracic aorta at 35-75% of the R-R interval. Average HR during the scan was 60 bpm. The 3D data set was interpreted on a dedicated work station using MPR, MIP and VRT modes. A total of 80cc of contrast was used. FINDINGS: Non-cardiac: See separate report from [facility]. Pulmonary veins drain normally to the left atrium. Calcium Score: 157 Agatston units. Coronary Arteries: Right dominant with no anomalies LM: Calcified plaque, minimal stenosis. LAD system: Calcified plaque proximal LAD with mild (<50%) stenosis. Circumflex system: No plaque noted in the LCx system. There appears to be motion artifact in the mid LCx that affects interpretation of a small segment of the mid LCx. RCA system: No plaque or stenosis. IMPRESSION: 1. Coronary artery calcium score 157 Agatston units. This places the patient in the 64th percentile for age and gender, suggesting intermediate risk for future cardiac events. 2. There does not appear to be obstructive coronary disease present. There is an area in the mid LCx obscured by artifact but suspect there is not severe stenosis at this location. Dalton Mclean  Electronically Signed   By: Loralie Champagne M.D.   On: 12/30/2018 16:33   Result Date: 12/30/2018 EXAM: OVER-READ INTERPRETATION  CT CHEST The following report is an over-read performed by radiologist Dr. Vinnie Langton of [facility], [place] on 12/30/2018. This over-read does not include interpretation of cardiac or coronary anatomy or pathology. The coronary calcium score/coronary CTA interpretation by the cardiologist is attached. COMPARISON:  None. FINDINGS: Aortic atherosclerosis. Within the visualized portions of the thorax there are no suspicious appearing pulmonary nodules or masses, there is no acute consolidative airspace disease, no pleural effusions, no pneumothorax and no lymphadenopathy. Visualized portions of the upper abdomen are unremarkable. There are no aggressive appearing lytic or blastic lesions noted in the visualized portions of the skeleton. Left-sided breast implant. IMPRESSION: 1. Aortic  atherosclerosis. Electronically Signed: By: Vinnie Langton M.D. On: 12/30/2018 15:15   Ct Angio Chest/abd/pel For Dissection W And/or W/wo  Result Date: 12/29/2018 CLINICAL DATA:  Chest pain EXAM: CT ANGIOGRAPHY CHEST, ABDOMEN AND PELVIS TECHNIQUE: Multidetector CT imaging through the chest, abdomen and pelvis was performed using the standard protocol during bolus administration of intravenous contrast. Multiplanar reconstructed images and MIPs were obtained and reviewed to evaluate the vascular anatomy. CONTRAST:  40mL OMNIPAQUE IOHEXOL 350 MG/ML SOLN COMPARISON:  None. FINDINGS: CTA CHEST FINDINGS Cardiovascular: Moderate aortic atherosclerosis. No aneurysm or dissection. Heart is borderline in size. No filling defects in the pulmonary arteries to suggest pulmonary emboli. Mediastinum/Nodes: No mediastinal, hilar, or axillary adenopathy. Lungs/Pleura: No confluent opacities or effusions. Musculoskeletal: Prior left breast implant. Chest wall soft tissues otherwise unremarkable. No acute bony  abnormality. Review of the MIP images confirms the above findings. CTA ABDOMEN AND PELVIS FINDINGS VASCULAR Aorta: Atherosclerotic irregularity with both calcified and noncalcified plaque. No aneurysm or dissection. Celiac: Occluded. Collateral flow noted in the splenic and hepatic arteries via the SMA. SMA: Patent without evidence of aneurysm, dissection, vasculitis or significant stenosis. Renals: Single bilaterally, patent. IMA: Patent. Inflow: Calcified and noncalcified plaque in the common and external iliac arteries bilaterally. No significant stenosis, aneurysm or dissection. Veins: No obvious venous abnormality within the limitations of this arterial phase study. Review of the MIP images confirms the above findings. NON-VASCULAR Hepatobiliary: No focal hepatic abnormality. Small layering gallstones within the gallbladder. Pancreas: No focal abnormality or ductal dilatation. Spleen: No focal abnormality.  Normal size. Adrenals/Urinary Tract: Suspect parapelvic cysts on the right and possibly the left. However, cannot exclude hydronephrosis on the left. The ureters are decompressed. If this is hydronephrosis on the left, I would favor chronic UPJ obstruction. Urinary bladder and adrenal glands unremarkable. Stomach/Bowel: Normal appendix. Stomach, large and small bowel grossly unremarkable. Lymphatic: No adenopathy Reproductive: Calcified fibroids in the uterus.  No adnexal mass. Other: No free fluid or free air. Musculoskeletal: No acute bony abnormality. Review of the MIP images confirms the above findings. IMPRESSION: Calcified and noncalcified plaque throughout the thoracic and abdominal aorta. No aneurysm or dissection. 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 No evidence of pulmonary embolus. Probable parapelvic cysts on the right. Parapelvic cysts versus hydronephrosis on the left. Ureter is decompressed, so if this is hydronephrosis, I would favor chronic  UPJ obstruction. Electronically Signed   By: Rolm Baptise M.D.   On: 12/29/2018 19:54     Patient Profile     78 y.o. female w/ hx hyperlipidemia and difficulty with statins, HTN, hypothyroidism, low Vit. D, who was admitted 04/20 for chest pain with radiation to back.  Assessment & Plan    1. Chest pain:  - EF nl w/ no other sig abnormalities on echo, report above - Cardiac CTA report above, CFX not clearly seen but no obvious significant CAD - further eval per MD but may have a component of reflux, will add Protonix for now  2. Hypokalemia - did not get any yesterday, for 60 meq today - per IM  Otherwise, per IM Principal Problem:   Chest pain Active Problems:   Hyperlipidemia, mixed   Obesity (BMI 30-39.9)   Essential hypertension   Vitamin D deficiency   Hypothyroidism   Osteoporosis    Signed, Rosaria Ferries , PA-C 7:28 AM 12/31/2018 Pager: [phone] As above, patient seen and examined.  She still has occasional chest pain that increases with palpation  and some relief with belching.  Enzymes negative.  Cardiac CT yesterday not suggestive of obstructive coronary disease.  No plans for further ischemia evaluation.  Would continue aspirin and preadmission cholesterol medications.  Note creatinine mildly increased today.  She has had recent CTA at time of admission and cardiac CTA yesterday.  Question component of contrast nephropathy.  Will check bmet on Friday.  Etiology of chest pain possibly musculoskeletal versus gastroesophageal reflux disease.  Supplement potassium.  Patient may be discharged from a cardiac standpoint.  We will arrange follow-up with Dr. Harrington Challenger in 6 to 8 weeks.  [facility] will sign off.   Medication Recommendations: Continue present medications at discharge. Other recommendations (labs, testing, etc):  BMET 01/02/19 Follow up as an outpatient: Follow-up Dr. Harrington Challenger 6 to 8 weeks.  Kirk Ruths, MD

## 2018-12-31 NOTE — Discharge Summary (Signed)
Physician Discharge Summary  [name]:[id number] DOB: March 11, 1941 DOA: 12/29/2018  PCP: Marin Olp, MD  Admit date: 12/29/2018 Discharge date: 12/31/2018  Admitted From: Home Disposition:  home  Recommendations for Outpatient Follow-up:  1. Follow up with PCP in 2-3 weeks 2. Follow up with Cardiology as scheduled  Discharge Condition:Stable CODE STATUS:Full Diet recommendation: Heart healthy   Brief/Interim Summary: 78 year old female with history of HTN, HLD, hypothyroidism, obesity and left breast CA s/p mastectomy and breast implant about 13 years ago presenting with intermittent chest pain for 5 days prior to admission.  Chest pain is not necessarily exertional.  However, she endorses some exertional dyspnea.  Patient is poor historian.  Of note, lately feels sad since the anniversary of her husband's death that was even made worse by COVID isolation.  In ED, SBP elevated to 160s to 170s.  K3.3.  Serial troponin negative.  EKG without acute ischemic finding.  Was given p.o. metoprolol 12.5 mg and admitted for ACS rule out.   CTA chest negative for PE or any other acute intrathoracic findings.  The next day, continues to have mild lower chest pain.  Given risk factor (Harvette score of 4) cardiology consulted and recommended echocardiogram and CCTA.   Discharge Diagnoses:  Principal Problem:   Chest pain Active Problems:   Hyperlipidemia, mixed   Obesity (BMI 30-39.9)   Essential hypertension   Vitamin D deficiency   Hypothyroidism   Osteoporosis  Chest pain: history not convincing but patient is poor historian.  Heart score 4.  Serial troponin and EKG negative.  CTA chest negative.  Echo with EF of 60 to 65% and no significant structural functional abnormalities but limited study. -Cardiology consulted-CCTA  -Metoprolol 25 mg twice daily -PRN nitro -Continue low-dose aspirin.  Status post full dose aspirin -Continue home Zetia. -A1c 5.5. -CCTA  performed, findings suggest intermediate risk for future cardiac events with no obstructive disease present. -Discussed with Cardiology, ok for discharge with close outpatient follow up  Essential hypertension-normotensive.  On chlorthalidone at home -Metoprolol 25 mg twice daily started on admission. -Continue chlorthalidone  Hypokalemia-likely due to thiazide diuretics -K-Dur 40 mg x 2  Overweight: A1c 28 -Encouraged lifestyle change to lose weight  History of breast cancer status post left mastectomy and breast implant 13 years ago-stable  Psychosocial-some difficulty around the anniversary of her husband's death, and COVID lock down.  No psych history. -Psychosocial support  Glaucoma -Continue home eyedrops  Hypothyroidism: TSH within normal range -Continue home Synthroid   Discharge Instructions   Allergies as of 12/31/2018      Reactions   Adhesive [tape] Other (See Comments)   Causes skin to turn red where touched   Rosuvastatin Other (See Comments)   Joint & muscle aches, sleep issues.      Medication List    STOP taking these medications   aspirin EC 325 MG tablet Replaced by:  aspirin 81 MG chewable tablet     TAKE these medications   AMBULATORY NON FORMULARY MEDICATION Take 180 mg by mouth daily. Medication Name: 180 mg bempedoic acid vs a placebo, CLEAR Research Study drug provided   aspirin 81 MG chewable tablet Chew 1 tablet (81 mg total) by mouth daily for 30 days. Start taking on:  January 01, 2019 Replaces:  aspirin EC 325 MG tablet   chlorthalidone 25 MG tablet Commonly known as:  HYGROTON TAKE (1/2) TABLET DAILY. What changed:  See the new instructions.   CoQ10 200 MG Caps Take  200 mg by mouth 2 (two) times a week.   dorzolamide-timolol 22.3-6.8 MG/ML ophthalmic solution Commonly known as:  COSOPT Place 1 drop into the left eye 2 (two) times daily.   Durezol 0.05 % Emul Generic drug:  Difluprednate Place 1 drop into the right  eye daily.   ezetimibe 10 MG tablet Commonly known as:  Zetia Take 1 tablet (10 mg total) by mouth daily.   Krill Oil 1000 MG Caps Take 1,000 mg by mouth daily. Mega Red   levothyroxine 50 MCG tablet Commonly known as:  SYNTHROID TAKE 1 TABLET ONCE DAILY. What changed:  when to take this   LUMIGAN OP Place 1 drop into both eyes at bedtime.   metoprolol tartrate 25 MG tablet Commonly known as:  LOPRESSOR Take 1 tablet (25 mg total) by mouth 2 (two) times daily for 30 days.   OMEGA III EPA+DHA PO Take 1 capsule by mouth daily.   omega-3 acid ethyl esters 1 g capsule Commonly known as:  LOVAZA Take 1 capsule (1 g total) by mouth daily for 30 days. Start taking on:  January 01, 2019   pantoprazole 40 MG tablet Commonly known as:  PROTONIX Take 1 tablet (40 mg total) by mouth daily at 6 (six) AM for 30 days. Start taking on:  January 01, 2019   pilocarpine 1 % ophthalmic solution Commonly known as:  PILOCAR Place 1 drop into the left eye 2 (two) times daily.   Probiotic Daily Caps Take 1 capsule by mouth daily. Reported on 10/10/2015   SYSTANE OP Place 1-2 drops into both eyes as needed (for dry eyes).   Vitamin D-3 125 MCG (5000 UT) Tabs Take 5,000 mg by mouth daily.      Follow-up Information    Marin Olp, MD. Schedule an appointment as soon as possible for a visit in 3 week(s).   Specialty:  Family Medicine Contact information: [place] [address] [phone]        Fay Records, MD .   Specialty:  Cardiology Contact information: [address] [place] [address] [phone]          Allergies  Allergen Reactions  . Adhesive [Tape] Other (See Comments)    Causes skin to turn red where touched  . Rosuvastatin Other (See Comments)    Joint & muscle aches, sleep issues.    Consultations:  Cardiology  Procedures/Studies: Dg Chest 2 View  Result Date: 12/29/2018 CLINICAL DATA:  Acute chest  pain for 1 week EXAM: CHEST - 2 VIEW COMPARISON:  12/10/2015 FINDINGS: UPPER limits normal heart size noted. There is no evidence of focal airspace disease, pulmonary edema, suspicious pulmonary nodule/mass, pleural effusion, or pneumothorax. No acute bony abnormalities are identified. IMPRESSION: UPPER limits normal heart size without evidence of acute cardiopulmonary disease. Electronically Signed   By: Margarette Canada M.D.   On: 12/29/2018 15:19   Ct Coronary Morph W/cta Cor W/score W/ca W/cm &/or Wo/cm  Addendum Date: 12/30/2018   ADDENDUM REPORT: 12/30/2018 16:33 CLINICAL DATA:  Chest pain EXAM: Cardiac CTA MEDICATIONS: Sub lingual nitro. 4mg  x 2 and lopressor 50 mg po TECHNIQUE: The patient was scanned on a Siemens 852 slice scanner. Gantry rotation speed was 250 msecs. Collimation was 0.6 mm. A 100 kV prospective scan was triggered in the ascending thoracic aorta at 35-75% of the R-R interval. Average HR during the scan was 60 bpm. The 3D data set was interpreted on a dedicated work  station using MPR, MIP and VRT modes. A total of 80cc of contrast was used. FINDINGS: Non-cardiac: See separate report from [facility]. Pulmonary veins drain normally to the left atrium. Calcium Score: 157 Agatston units. Coronary Arteries: Right dominant with no anomalies LM: Calcified plaque, minimal stenosis. LAD system: Calcified plaque proximal LAD with mild (<50%) stenosis. Circumflex system: No plaque noted in the LCx system. There appears to be motion artifact in the mid LCx that affects interpretation of a small segment of the mid LCx. RCA system: No plaque or stenosis. IMPRESSION: 1. Coronary artery calcium score 157 Agatston units. This places the patient in the 64th percentile for age and gender, suggesting intermediate risk for future cardiac events. 2. There does not appear to be obstructive coronary disease present. There is an area in the mid LCx obscured by artifact but suspect there is not severe  stenosis at this location. Dalton Mclean Electronically Signed   By: Loralie Champagne M.D.   On: 12/30/2018 16:33   Result Date: 12/30/2018 EXAM: OVER-READ INTERPRETATION  CT CHEST The following report is an over-read performed by radiologist Dr. Vinnie Langton of [facility], [place] on 12/30/2018. This over-read does not include interpretation of cardiac or coronary anatomy or pathology. The coronary calcium score/coronary CTA interpretation by the cardiologist is attached. COMPARISON:  None. FINDINGS: Aortic atherosclerosis. Within the visualized portions of the thorax there are no suspicious appearing pulmonary nodules or masses, there is no acute consolidative airspace disease, no pleural effusions, no pneumothorax and no lymphadenopathy. Visualized portions of the upper abdomen are unremarkable. There are no aggressive appearing lytic or blastic lesions noted in the visualized portions of the skeleton. Left-sided breast implant. IMPRESSION: 1. Aortic atherosclerosis. Electronically Signed: By: Vinnie Langton M.D. On: 12/30/2018 15:15   Ct Angio Chest/abd/pel For Dissection W And/or W/wo  Result Date: 12/29/2018 CLINICAL DATA:  Chest pain EXAM: CT ANGIOGRAPHY CHEST, ABDOMEN AND PELVIS TECHNIQUE: Multidetector CT imaging through the chest, abdomen and pelvis was performed using the standard protocol during bolus administration of intravenous contrast. Multiplanar reconstructed images and MIPs were obtained and reviewed to evaluate the vascular anatomy. CONTRAST:  11mL OMNIPAQUE IOHEXOL 350 MG/ML SOLN COMPARISON:  None. FINDINGS: CTA CHEST FINDINGS Cardiovascular: Moderate aortic atherosclerosis. No aneurysm or dissection. Heart is borderline in size. No filling defects in the pulmonary arteries to suggest pulmonary emboli. Mediastinum/Nodes: No mediastinal, hilar, or axillary adenopathy. Lungs/Pleura: No confluent opacities or effusions. Musculoskeletal: Prior left breast implant. Chest wall soft  tissues otherwise unremarkable. No acute bony abnormality. Review of the MIP images confirms the above findings. CTA ABDOMEN AND PELVIS FINDINGS VASCULAR Aorta: Atherosclerotic irregularity with both calcified and noncalcified plaque. No aneurysm or dissection. Celiac: Occluded. Collateral flow noted in the splenic and hepatic arteries via the SMA. SMA: Patent without evidence of aneurysm, dissection, vasculitis or significant stenosis. Renals: Single bilaterally, patent. IMA: Patent. Inflow: Calcified and noncalcified plaque in the common and external iliac arteries bilaterally. No significant stenosis, aneurysm or dissection. Veins: No obvious venous abnormality within the limitations of this arterial phase study. Review of the MIP images confirms the above findings. NON-VASCULAR Hepatobiliary: No focal hepatic abnormality. Small layering gallstones within the gallbladder. Pancreas: No focal abnormality or ductal dilatation. Spleen: No focal abnormality.  Normal size. Adrenals/Urinary Tract: Suspect parapelvic cysts on the right and possibly the left. However, cannot exclude hydronephrosis on the left. The ureters are decompressed. If this is hydronephrosis on the left, I would favor chronic UPJ obstruction. Urinary bladder and adrenal glands  unremarkable. Stomach/Bowel: Normal appendix. Stomach, large and small bowel grossly unremarkable. Lymphatic: No adenopathy Reproductive: Calcified fibroids in the uterus.  No adnexal mass. Other: No free fluid or free air. Musculoskeletal: No acute bony abnormality. Review of the MIP images confirms the above findings. IMPRESSION: Calcified and noncalcified plaque throughout the thoracic and abdominal aorta. No aneurysm or dissection. 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 No evidence of pulmonary embolus. Probable parapelvic cysts on the right. Parapelvic cysts versus hydronephrosis on the left. Ureter is decompressed, so if  this is hydronephrosis, I would favor chronic UPJ obstruction. Electronically Signed   By: Rolm Baptise M.D.   On: 12/29/2018 19:54     Subjective: Eager to go home  Discharge Exam: Vitals:   12/31/18 0445 12/31/18 0947  BP: 132/62 132/78  Pulse: 64 83  Resp: 16   Temp: 97.8 F (36.6 C)   SpO2: 97%    Vitals:   12/30/18 2011 12/31/18 0445 12/31/18 0448 12/31/18 0947  BP: 127/72 132/62  132/78  Pulse: 61 64  83  Resp:  16    Temp: 98.1 F (36.7 C) 97.8 F (36.6 C)    TempSrc: Oral Oral    SpO2: 98% 97%    Weight:   69.7 kg   Height:        General: Pt is alert, awake, not in acute distress Cardiovascular: RRR, S1/S2 +, no rubs, no gallops Respiratory: CTA bilaterally, no wheezing, no rhonchi Abdominal: Soft, NT, ND, bowel sounds + Extremities: no edema, no cyanosis   The results of significant diagnostics from this hospitalization (including imaging, microbiology, ancillary and laboratory) are listed below for reference.     Microbiology: No results found for this or any previous visit (from the past 240 hour(s)).   Labs: BNP (last 3 results) No results for input(s): BNP in the last 8760 hours. Basic Metabolic Panel: Recent Labs  Lab 12/29/18 1519 12/30/18 0408 12/31/18 0404  NA 137 141 140  K 3.3* 3.1* 3.1*  CL 102 102 105  CO2 27 26 24   GLUCOSE 95 83 99  BUN 15 12 20   CREATININE 0.96 0.90 1.31*  CALCIUM 9.9 9.4 9.1  MG  --  2.3  --    Liver Function Tests: Recent Labs  Lab 12/29/18 1519  AST 24  ALT 23  ALKPHOS 45  BILITOT 1.0  PROT 7.0  ALBUMIN 4.6   No results for input(s): LIPASE, AMYLASE in the last 168 hours. No results for input(s): AMMONIA in the last 168 hours. CBC: Recent Labs  Lab 12/29/18 1519  WBC 7.7  HGB 14.1  HCT 44.2  MCV 90.6  PLT 285   Cardiac Enzymes: Recent Labs  Lab 12/29/18 1519 12/29/18 1817 12/29/18 2111  TROPONINI <0.03 <0.03 <0.03   BNP: Invalid input(s): POCBNP CBG: No results for input(s):  GLUCAP in the last 168 hours. D-Dimer Recent Labs    12/29/18 1519  DDIMER 0.31   Hgb A1c Recent Labs    12/30/18 0817  HGBA1C 5.5   Lipid Profile Recent Labs    12/30/18 0817  CHOL 212*  HDL 45  LDLCALC 134*  TRIG 167*  CHOLHDL 4.7   Thyroid function studies Recent Labs    12/30/18 0817  TSH 1.284   Anemia work up No results for input(s): VITAMINB12, FOLATE, FERRITIN, TIBC, IRON, RETICCTPCT in the last 72 hours. Urinalysis    Component Value Date/Time   COLORURINE YELLOW 09/21/2014 1541  APPEARANCEUR CLEAR 09/21/2014 1541   LABSPEC 1.010 09/21/2014 1541   PHURINE 6.0 09/21/2014 1541   GLUCOSEU NEGATIVE 09/21/2014 1541   HGBUR NEGATIVE 09/21/2014 1541   BILIRUBINUR Negative 03/31/2018 1300   KETONESUR TRACE (A) 09/21/2014 1541   PROTEINUR Negative 03/31/2018 1300   UROBILINOGEN 0.2 03/31/2018 1300   UROBILINOGEN 0.2 09/21/2014 1541   NITRITE Negative 03/31/2018 1300   NITRITE NEGATIVE 09/21/2014 1541   LEUKOCYTESUR Negative 03/31/2018 1300   Sepsis Labs Invalid input(s): PROCALCITONIN,  WBC,  LACTICIDVEN Microbiology No results found for this or any previous visit (from the past 240 hour(s)).  Time spent: 72min  SIGNED:   Marylu Lund, MD  [facility] 12/31/2018, 10:53 AM  If 7PM-7AM, please contact night-coverage

## 2019-01-01 ENCOUNTER — Telehealth: Payer: Self-pay

## 2019-01-01 NOTE — Telephone Encounter (Signed)
LM for patient to return call for TCM call.  Patient has been scheduled for hospital follow up.

## 2019-01-02 ENCOUNTER — Other Ambulatory Visit: Payer: Self-pay

## 2019-01-02 ENCOUNTER — Other Ambulatory Visit: Payer: Self-pay | Admitting: Family Medicine

## 2019-01-02 ENCOUNTER — Telehealth: Payer: Self-pay | Admitting: Internal Medicine

## 2019-01-02 ENCOUNTER — Other Ambulatory Visit: Payer: Medicare Other | Admitting: *Deleted

## 2019-01-02 DIAGNOSIS — R072 Precordial pain: Secondary | ICD-10-CM

## 2019-01-02 DIAGNOSIS — Z1231 Encounter for screening mammogram for malignant neoplasm of breast: Secondary | ICD-10-CM

## 2019-01-02 NOTE — Telephone Encounter (Signed)
  Patient saw Dr Stanford Breed in the hospital and he told her she would need to do some lab work after she left the hospital. She is calling to schedule but there are no orders.

## 2019-01-02 NOTE — Telephone Encounter (Signed)
Reviewed hospital dc instructions. Pt was to come to lab today for bloodwork to recheck creatinine.  Last potassium level also abnormal 3.1. She will try to come today.  Since it is already 2 pm she is not sure she can find someone to bring her. If not, will come on Monday.  Order placed for BMET Will route to Dr. Harrington Challenger to inform pt is coming to office for lab today. Also it was recommended a post hospital follow up with Dr. Harrington Challenger.  Will arrange virtual visit.

## 2019-01-03 LAB — BASIC METABOLIC PANEL
BUN/Creatinine Ratio: 19 (ref 12–28)
BUN: 19 mg/dL (ref 8–27)
CO2: 24 mmol/L (ref 20–29)
Calcium: 9.9 mg/dL (ref 8.7–10.3)
Chloride: 99 mmol/L (ref 96–106)
Creatinine, Ser: 0.99 mg/dL (ref 0.57–1.00)
GFR calc Af Amer: 64 mL/min/{1.73_m2} (ref >59)
GFR calc non Af Amer: 55 mL/min/{1.73_m2} — ABNORMAL LOW (ref >59)
Glucose: 113 mg/dL — ABNORMAL HIGH (ref 65–99)
Potassium: 3.7 mmol/L (ref 3.5–5.2)
Sodium: 140 mmol/L (ref 134–144)

## 2019-01-06 ENCOUNTER — Ambulatory Visit (INDEPENDENT_AMBULATORY_CARE_PROVIDER_SITE_OTHER): Payer: Medicare Other | Admitting: Family Medicine

## 2019-01-06 ENCOUNTER — Encounter: Payer: Self-pay | Admitting: Family Medicine

## 2019-01-06 VITALS — BP 137/75 | HR 71 | Ht 62.5 in | Wt 155.5 lb

## 2019-01-06 DIAGNOSIS — R079 Chest pain, unspecified: Secondary | ICD-10-CM | POA: Diagnosis not present

## 2019-01-06 DIAGNOSIS — E782 Mixed hyperlipidemia: Secondary | ICD-10-CM

## 2019-01-06 DIAGNOSIS — K219 Gastro-esophageal reflux disease without esophagitis: Secondary | ICD-10-CM

## 2019-01-06 DIAGNOSIS — F4321 Adjustment disorder with depressed mood: Secondary | ICD-10-CM

## 2019-01-06 DIAGNOSIS — I10 Essential (primary) hypertension: Secondary | ICD-10-CM

## 2019-01-06 MED ORDER — METOPROLOL TARTRATE 25 MG PO TABS: 25.0000 mg | ORAL_TABLET | Freq: Two times a day (BID) | ORAL | 3 refills | Status: DC

## 2019-01-06 MED ORDER — EZETIMIBE 10 MG PO TABS: 10.0000 mg | ORAL_TABLET | Freq: Every day | ORAL | 3 refills | Status: DC

## 2019-01-06 MED ORDER — CHLORTHALIDONE 25 MG PO TABS: ORAL_TABLET | ORAL | 3 refills | Status: DC

## 2019-01-06 MED ORDER — LEVOTHYROXINE SODIUM 50 MCG PO TABS: 50.0000 ug | ORAL_TABLET | Freq: Every day | ORAL | 3 refills | Status: DC

## 2019-01-06 NOTE — Patient Instructions (Addendum)
There are no preventive care reminders to display for this patient.   Video visit

## 2019-01-06 NOTE — Assessment & Plan Note (Signed)
chest pain may have been related to GERD as well with stress.  She is going to continue Protonix over the last month-when she comes off of this if her symptoms recur I am willing to refill this for her. - Echocardiogram and coronary CT did not suggest obvious ischemia

## 2019-01-06 NOTE — Progress Notes (Signed)
Phone [phone]   Subjective:  Virtual visit via Video note. Chief complaint: Chief Complaint  Patient presents with  . Hospitalization Follow-up    This visit type was conducted due to national recommendations for restrictions regarding the COVID-19 Pandemic (e.g. social distancing).  This format is felt to be most appropriate for this patient at this time balancing risks to patient and risks to population by having him in for in person visit.  No physical exam was performed (except for noted visual exam or audio findings with Telehealth visits).    Our team/I connected with [name] on 01/06/19 at 10:00 AM EDT by a video enabled telemedicine application ([email or web address]) and verified that I am speaking with the correct person using two identifiers.  Location patient: [place] Location provider: [facility], office Persons participating in the virtual visit:  patient  Our team/I discussed the limitations of evaluation and management by telemedicine and the availability of in person appointments. In light of current covid-19 pandemic, patient also understands that we are trying to protect them by minimizing in office contact if at all possible.  The patient expressed consent for telemedicine visit and agreed to proceed. Patient understands insurance will be billed.   ROS-continued burping/belching as well as mild intermittent chest pain-better on PPI.  No worsening shortness of breath.  No headache or blurry vision reported.  Past Medical History-  Patient Active Problem List   Diagnosis Date Noted  . Grieving 02/18/2017    Priority: High  . Osteoporosis 11/05/2016    Priority: High  . Chest pain 12/29/2018    Priority: Medium  . Primary open-angle glaucoma 11/20/2017    Priority: Medium  . Hypothyroidism 07/01/2013    Priority: Medium  . Essential hypertension 11/02/2012    Priority: Medium  . Hyperlipidemia, mixed 07/24/2011    Priority: Medium  . hx: breast cancer, IDC  (Microinvasive) w DCIS, 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 - 07/10/2011    Priority: Medium  . History of adenomatous polyp of colon 09/30/2018    Priority: Low  . Medicare annual wellness visit, subsequent 09/26/2014    Priority: Low  . Vitamin D deficiency 11/02/2012    Priority: Low  . Obesity (BMI 30-39.9) 07/24/2011    Priority: Low  . Pseudophakia of both eyes 11/20/2017    Medications- reviewed and updated Current Outpatient Medications  Medication Sig Dispense Refill  . AMBULATORY NON FORMULARY MEDICATION Take 180 mg by mouth daily. Medication Name: 180 mg bempedoic acid vs a placebo, CLEAR Research Study drug provided    . aspirin 81 MG chewable tablet Chew 1 tablet (81 mg total) by mouth daily for 30 days. 30 tablet 0  . Bimatoprost (LUMIGAN OP) Place 1 drop into both eyes at bedtime.     . chlorthalidone (HYGROTON) 25 MG tablet TAKE (1/2) TABLET DAILY. 45 tablet 3  . Cholecalciferol (VITAMIN D-3) 5000 UNITS TABS Take 5,000 mg by mouth daily.    . dorzolamide-timolol (COSOPT) 22.3-6.8 MG/ML ophthalmic solution Place 1 drop into the left eye 2 (two) times daily.     . DUREZOL 0.05 % EMUL Place 1 drop into the right eye daily.    Marland Kitchen ezetimibe (ZETIA) 10 MG tablet Take 1 tablet (10 mg total) by mouth daily. 90 tablet 3  . Krill Oil 1000 MG CAPS Take 1,000 mg by mouth daily. Mega Red     . levothyroxine (SYNTHROID) 50 MCG tablet Take 1 tablet (50 mcg total) by mouth daily before breakfast. 90 tablet 3  .  metoprolol tartrate (LOPRESSOR) 25 MG tablet Take 1 tablet (25 mg total) by mouth 2 (two) times daily for 30 days. 180 tablet 3  . omega-3 acid ethyl esters (LOVAZA) 1 g capsule Take 1 capsule (1 g total) by mouth daily for 30 days. 30 capsule 0  . Omega-3 Fatty Acids (OMEGA III EPA+DHA PO) Take 1 capsule by mouth daily.     . pantoprazole (PROTONIX) 40 MG tablet Take 1 tablet (40 mg total) by mouth daily at 6 (six) AM for 30 days. 30 tablet 0  . pilocarpine (PILOCAR) 1 % ophthalmic solution Place 1  drop into the left eye 2 (two) times daily.    Vladimir Faster Glycol-Propyl Glycol (SYSTANE OP) Place 1-2 drops into both eyes as needed (for dry eyes).     . Probiotic Product (PROBIOTIC DAILY) CAPS Take 1 capsule by mouth daily. Reported on 10/10/2015     No current facility-administered medications for this visit.      Objective:  BP 137/75   Pulse 71   Ht 5' 2.5" (1.588 m)   Wt 155 lb 8 oz (70.5 kg)   BMI 27.99 kg/m  Gen: NAD, resting comfortably Lungs: nonlabored, normal respiratory rate  Skin: appears dry, no obvious rash Speech Tearful when thinking about her now deceased husband    Assessment and Plan    #Hospital follow-up for chest pain- ultimately thought to be GERD S: Patient was hospitalized from April 20 to December 31, 2018 for chest pain evaluation.  Patient had been having intermittent chest pain for 5 days prior to admission.Chest pain itself was not exertional but she did  Endorsed some exertional dyspnea.  This was  around the anniversary of her husband's death last year and she admits COVID isolation has made things worse.  She reflects that her stress has been very high related to this initial blood pressure was elevated in the 160s to 170s.   Included work-up-potassium slightly low.  Serial troponins were negative.  EKG was without acute ischemic finding.  CT of the chest was negative for PE or other acute intrathoracic findings.  She had an echocardiogram which was largely reassuring.  She had coronary CT with calcium score of 157 Agatston units-this placed her at the 64th percentile for age-there was no clear obstructive coronary disease- LAD had less than 50% stenosis, though the mid circumflex on the left was not fully visualized  Patient was started on aspirin long-term due to intermediate risk calcium score/findings  Patient was started on metoprolol in addition to her chlorthalidone and she found that helpful.  Perhaps very mild dizziness  Patient did note some  improvement in her symptoms on PPI and this was continued post discharge.  Post discharge-burping goes along with chest apin- intermittent issues- prrotonix seems to help  Patient had a temporary bump in her creatinine (she states related to contrast use) which improved with hydration at home-she had follow-up BMP with cardiology after discharge.  She has tried to stay very well-hydrated at home and has been avoiding codes.  Her blood pressure has been much better at home-she has been recording this daily.  Using wrist cuff- hard to get arm cuff on due to prior shoulder injury He is using she has been trying to walk on a daily basis.  For her lipids-she has been continued on Zetia-she has been strongly statin intolerant in the past  From notes it appears cardiology will reach out for follow-up in 6 to 8 weeks-I discussed with  patient if they do not she can certainly call their office  A/P:  #Chest pain- chest pain may have been related to GERD as well with stress.  She is going to continue Protonix over the last month-when she comes off of this if her symptoms recur I am willing to refill this for her. - Echocardiogram and coronary CT did not suggest obvious ischemia -Last cardiology note from hospital states they will reach out to her in 6 to 8 weeks-she is going to call if she does not hear within this timeframe  #Grieving- strongly suspect patient's symptoms were contributed to by anniversary of husband's death-counseling was provided today  #Hypertension- controlled on chlorthalidone 12.5 mg with recent addition of metoprolol 25 mg twice a day.  I refilled this medication for today  #Hyperlipidemia-At age 49- coronary CT score at 64th percentile- also with some intermediate risk findings. continue Zetia.  Would love to have her on a statin but is failed multiple attempts-due to myalgias  Future Appointments  Date Time Provider [place]  03/03/2019  7:30 AM [facility]  [facility]  04/29/2019  9:40 AM Hunter, Brayton Mars, MD [facility]   Lab/Order associations: Chest pain, unspecified type  Gastroesophageal reflux disease without esophagitis  Essential hypertension  Hyperlipidemia, mixed  Grieving  Meds ordered this encounter  Medications  . metoprolol tartrate (LOPRESSOR) 25 MG tablet    Sig: Take 1 tablet (25 mg total) by mouth 2 (two) times daily for 30 days.    Dispense:  180 tablet    Refill:  3  . chlorthalidone (HYGROTON) 25 MG tablet    Sig: TAKE (1/2) TABLET DAILY.    Dispense:  45 tablet    Refill:  3  . ezetimibe (ZETIA) 10 MG tablet    Sig: Take 1 tablet (10 mg total) by mouth daily.    Dispense:  90 tablet    Refill:  3  . levothyroxine (SYNTHROID) 50 MCG tablet    Sig: Take 1 tablet (50 mcg total) by mouth daily before breakfast.    Dispense:  90 tablet    Refill:  3    Return precautions advised.  Garret Reddish, MD

## 2019-01-06 NOTE — Assessment & Plan Note (Signed)
controlled on chlorthalidone 12.5 mg with recent addition of metoprolol 25 mg twice a day.  I refilled this medication for today

## 2019-01-08 ENCOUNTER — Telehealth: Payer: Self-pay | Admitting: Internal Medicine

## 2019-01-08 NOTE — Telephone Encounter (Signed)
Virtual Visit Pre-Appointment Phone Call  "(AlexandraFowler), I am calling you today to discuss your upcoming appointment. We are currently trying to limit exposure to the virus that causes COVID-19 by seeing patients at home rather than in the office."  1. "What is the BEST phone number to call the day of the visit?" - include this in appointment notes  2. Do you have or have access to (through a family member/friend) a smartphone with video capability that we can use for your visit?" a. If yes - list this number in appt notes as cell (if different from BEST phone #) and list the appointment type as a VIDEO visit in appointment notes b. If no - list the appointment type as a PHONE visit in appointment notes  3. Confirm consent - "In the setting of the current Covid19 crisis, you are scheduled for a (phone or video) visit with your provider on (01/16/2019) at (10am).  Just as we do with many in-office visits, in order for you to participate in this visit, we must obtain consent.  If you'd like, I can send this to your mychart (if signed up) or email for you to review.  Otherwise, I can obtain your verbal consent now.  All virtual visits are billed to your insurance company just like a normal visit would be.  By agreeing to a virtual visit, we'd like you to understand that the technology does not allow for your provider to perform an examination, and thus may limit your provider's ability to fully assess your condition. If your provider identifies any concerns that need to be evaluated in person, we will make arrangements to do so.  Finally, though the technology is pretty good, we cannot assure that it will always work on either your or our end, and in the setting of a video visit, we may have to convert it to a phone-only visit.  In either situation, we cannot ensure that we have a secure connection.  Are you willing to proceed?" YES  4. Advise patient to be prepared - "Two hours prior to your  appointment, go ahead and check your blood pressure, pulse, oxygen saturation, and your weight (if you have the equipment to check those) and write them all down. When your visit starts, your provider will ask you for this information. If you have an Apple Watch or Kardia device, please plan to have heart rate information ready on the day of your appointment. Please have a pen and paper handy nearby the day of the visit as well."  5. Give patient instructions for MyChart download to smartphone OR Doximity/Doxy.me as below if video visit (depending on what platform provider is using)  6. Inform patient they will receive a phone call 15 minutes prior to their appointment time (may be from unknown caller ID) so they should be prepared to answer    TELEPHONE CALL NOTE  [name] has been deemed a candidate for a follow-up tele-health visit to limit community exposure during the Covid-19 pandemic. I spoke with the patient via phone to ensure availability of phone/video source, confirm preferred email & phone number, and discuss instructions and expectations.  I reminded [name] to be prepared with any vital sign and/or heart rhythm information that could potentially be obtained via home monitoring, at the time of her visit. I reminded [name] to expect a phone call prior to her visit.  Minus Liberty 01/08/2019 10:24 AM   INSTRUCTIONS FOR DOWNLOADING  THE MYCHART APP TO SMARTPHONE  - The patient must first make sure to have activated MyChart and know their login information - If Apple, go to [employer] and type in MyChart in the search bar and download the app. If Android, ask patient to go to [employer] and type in [place] in the search bar and download the app. The app is free but as with any other app downloads, their phone may require them to verify saved payment information or Apple/Android password.  - The patient will need to then log into the app with their MyChart  username and password, and select [facility] as their healthcare provider to link the account. When it is time for your visit, go to the MyChart app, find appointments, and click Begin Video Visit. Be sure to Select Allow for your device to access the Microphone and Camera for your visit. You will then be connected, and your provider will be with you shortly.  **If they have any issues connecting, or need assistance please contact MyChart service desk [phone])**  **If using a computer, in order to ensure the best quality for their visit they will need to use either of the following Internet Browsers: [employer], or Google Chrome**  IF USING DOXIMITY or DOXY.ME - The patient will receive a link just prior to their visit by text.     FULL LENGTH CONSENT FOR TELE-HEALTH VISIT   I hereby voluntarily request, consent and authorize [employer] and its employed or contracted physicians, physician assistants, nurse practitioners or other licensed health care professionals (the Practitioner), to provide me with telemedicine health care services (the Services") as deemed necessary by the treating Practitioner. I acknowledge and consent to receive the Services by the Practitioner via telemedicine. I understand that the telemedicine visit will involve communicating with the Practitioner through live audiovisual communication technology and the disclosure of certain medical information by electronic transmission. I acknowledge that I have been given the opportunity to request an in-person assessment or other available alternative prior to the telemedicine visit and am voluntarily participating in the telemedicine visit.  I understand that I have the right to withhold or withdraw my consent to the use of telemedicine in the course of my care at any time, without affecting my right to future care or treatment, and that the Practitioner or I may terminate the telemedicine visit at any  time. I understand that I have the right to inspect all information obtained and/or recorded in the course of the telemedicine visit and may receive copies of available information for a reasonable fee.  I understand that some of the potential risks of receiving the Services via telemedicine include:   Delay or interruption in medical evaluation due to technological equipment failure or disruption;  Information transmitted may not be sufficient (e.g. poor resolution of images) to allow for appropriate medical decision making by the Practitioner; and/or   In rare instances, security protocols could fail, causing a breach of personal health information.  Furthermore, I acknowledge that it is my responsibility to provide information about my medical history, conditions and care that is complete and accurate to the best of my ability. I acknowledge that Practitioner's advice, recommendations, and/or decision may be based on factors not within their control, such as incomplete or inaccurate data provided by me or distortions of diagnostic images or specimens that may result from electronic transmissions. I understand that the practice of medicine is not an exact science and that Practitioner  makes no warranties or guarantees regarding treatment outcomes. I acknowledge that I will receive a copy of this consent concurrently upon execution via email to the email address I last provided but may also request a printed copy by calling the office of [place].    I understand that my insurance will be billed for this visit.   I have read or had this consent read to me.  I understand the contents of this consent, which adequately explains the benefits and risks of the Services being provided via telemedicine.   I have been provided ample opportunity to ask questions regarding this consent and the Services and have had my questions answered to my satisfaction.  I give my informed consent for the services  to be provided through the use of telemedicine in my medical care  By participating in this telemedicine visit I agree to the above.

## 2019-01-08 NOTE — Telephone Encounter (Signed)
Good morning..Called pt. And got her scheduled for 5/8 at 10am. Completed trial doximity call with her. She said she prefers that we call her on her home phone first  before her visit. I was able to walk her through the doximity procedure while talking to her on her home phone.

## 2019-01-16 ENCOUNTER — Telehealth (INDEPENDENT_AMBULATORY_CARE_PROVIDER_SITE_OTHER): Payer: Medicare Other | Admitting: Internal Medicine

## 2019-01-16 ENCOUNTER — Encounter: Payer: Self-pay | Admitting: Internal Medicine

## 2019-01-16 ENCOUNTER — Other Ambulatory Visit: Payer: Self-pay

## 2019-01-16 VITALS — BP 125/64 | HR 64 | Ht 62.5 in | Wt 158.0 lb

## 2019-01-16 DIAGNOSIS — I2510 Atherosclerotic heart disease of native coronary artery without angina pectoris: Secondary | ICD-10-CM

## 2019-01-16 DIAGNOSIS — I10 Essential (primary) hypertension: Secondary | ICD-10-CM | POA: Diagnosis not present

## 2019-01-16 DIAGNOSIS — R0789 Other chest pain: Secondary | ICD-10-CM

## 2019-01-16 DIAGNOSIS — E782 Mixed hyperlipidemia: Secondary | ICD-10-CM

## 2019-01-16 NOTE — Patient Instructions (Signed)
Medication Instructions:  No changes today If you need a refill on your cardiac medications before your next appointment, please call your pharmacy.   Lab work: none If you have labs (blood work) drawn today and your tests are completely normal, you will receive your results only by: Marland Kitchen MyChart Message (if you have MyChart) OR . A paper copy in the mail If you have any lab test that is abnormal or we need to change your treatment, we will call you to review the results.  Testing/Procedures: none  Follow-Up: At [facility], you and your health needs are our priority.  As part of our continuing mission to provide you with exceptional heart care, we have created designated Provider Care Teams.  These Care Teams include your primary Cardiologist (physician) and Advanced Practice Providers (APPs -  Physician Assistants and Nurse Practitioners) who all work together to provide you with the care you need, when you need it. You will need a follow up appointment in:  OCT - 5 months.  Please call our office 2 months in advance to schedule this appointment.  You may see Dorris Carnes, MD or one of the following Advanced Practice Providers on your designated Care Team: Richardson Dopp, PA-C [place], [place] . Daune Perch, NP  Any Other Special Instructions Will Be Listed Below (If Applicable).

## 2019-01-16 NOTE — Progress Notes (Signed)
Virtual Visit via Video Note   This visit type was conducted due to national recommendations for restrictions regarding the COVID-19 Pandemic (e.g. social distancing) in an effort to limit this patient's exposure and mitigate transmission in our community.  Due to her co-morbid illnesses, this patient is at least at moderate risk for complications without adequate follow up.  This format is felt to be most appropriate for this patient at this time.  All issues noted in this document were discussed and addressed.  A limited physical exam was performed with this format.  Please refer to the patient's chart for her consent to telehealth for [facility].   Date:  01/16/2019   ID:  [name], DOB 1941/02/11, MRN [id number]  Patient Location: Home Provider Location: Home  PCP:  Marin Olp, MD  Cardiologist:  Dorris Carnes, MD  Electrophysiologist:  None   Evaluation Performed:  Follow-Up Visit  Chief Complaint:  F/U from hosp admit   History of Present Illness:    [name] is a 78 y.o. female with history of breast cancer (s/p mastectomychest pain I saw her back in 2017.  She also has a history of profound hyperlipidemia.  She did not tolerate Crestor or Livello due to achiness. SHe did participate in CLEAR research trial She also has a history of hypertension. .    THe pt was admitted to [facility] on 12/30/18 with CP  Pain began 4 days prior Mid chest to back  SUbsternal  Continuous.  Waxing/waning  Pt had CT of chest and adomen that showed extensive atherosclerosis of aorta and branches.  Cardiology was consulted   CT coronary angiogram recommended   She had this done 12/30/18   Calcium score was 157.   NOobstructive CAD noted.     Patient is part of CLEAR study   Last lipid 2 wks ago LDL 134  HDL 45  Since d/c she has had occasional CP   QUestions if GI    The patient does not  have symptoms concerning for COVID-19 infection (fever, chills, cough, or new shortness of  breath).    Past Medical History:  Diagnosis Date  . Abnormal thyroid function test 07/01/2013  . Anxiety   . Arthritis   . Cancer ([place])   . Dermatitis 11/25/2013  . Glaucoma 11/02/2012  . HTN (hypertension) 11/02/2012  . 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 Hyperlipidemia   . Hypothyroid   . Obesity    Past Surgical History:  Procedure Laterality Date  . AUGMENTATION MAMMAPLASTY Left    05/2006  . BREAST SURGERY  10/2005   Left Mastectomy, reconstruction, reduction on right. Dr. Harlow Mares  . COLONOSCOPY    . CRYOTHERAPY     GYN for CIN III  . DILATION AND CURETTAGE OF UTERUS    . EXCISION MORTON'S NEUROMA     right foot  . EYE SURGERY Right 05/08/2018  . REDUCTION MAMMAPLASTY Right      Current Meds  Medication Sig  . AMBULATORY NON FORMULARY MEDICATION Take 180 mg by mouth daily. Medication Name: 180 mg bempedoic acid vs a placebo, CLEAR Research Study drug provided  . aspirin 81 MG chewable tablet Chew 1 tablet (81 mg total) by mouth daily for 30 days.  . Bimatoprost (LUMIGAN OP) Place 1 drop into both eyes at bedtime.   . chlorthalidone (HYGROTON) 25 MG tablet TAKE (1/2) TABLET DAILY.  Marland Kitchen Cholecalciferol (VITAMIN D-3) 5000  UNITS TABS Take 5,000 mg by mouth daily.  . dorzolamide-timolol (COSOPT) 22.3-6.8 MG/ML ophthalmic solution Place 1 drop into the left eye 2 (two) times daily.   . DUREZOL 0.05 % EMUL Place 1 drop into the right eye daily.  Marland Kitchen ezetimibe (ZETIA) 10 MG tablet Take 1 tablet (10 mg total) by mouth daily.  Javier Docker Oil 1000 MG CAPS Take 1,000 mg by mouth daily. Mega Red   . levothyroxine (SYNTHROID) 50 MCG tablet Take 1 tablet (50 mcg total) by mouth daily before breakfast.  . metoprolol tartrate (LOPRESSOR) 25 MG tablet Take 1 tablet (25 mg total) by mouth 2 (two) times daily for 30 days.  . Omega-3 Fatty Acids (OMEGA III EPA+DHA PO) Take 1 capsule by mouth daily.   . pantoprazole (PROTONIX) 40 MG tablet Take 1 tablet  (40 mg total) by mouth daily at 6 (six) AM for 30 days.  . pilocarpine (PILOCAR) 1 % ophthalmic solution Place 1 drop into the left eye 2 (two) times daily.  Vladimir Faster Glycol-Propyl Glycol (SYSTANE OP) Place 1-2 drops into both eyes as needed (for dry eyes).   . Probiotic Product (PROBIOTIC DAILY) CAPS Take 1 capsule by mouth daily. Reported on 10/10/2015     Allergies:   Adhesive [tape] and Rosuvastatin   Social History   Tobacco Use  . Smoking status: Never Smoker  . Smokeless tobacco: Never Used  Substance Use Topics  . Alcohol use: Yes    Comment: Rare  . Drug use: No     Family Hx: The patient's family history includes CAD in her mother; Colon cancer in her paternal grandmother; Hyperlipidemia in her mother and sister; Other in her father; Parkinsonism in her mother; Pneumonia in her mother. There is no history of Prostate cancer, Breast cancer, Heart disease, or Diabetes.  ROS:   Please see the history of present illness.     All other systems reviewed and are negative.   Prior CV studies:   The following studies were reviewed today:   Labs/Other Tests and Data Reviewed:    EKG:  Not done today   Recent Labs: 12/29/2018: ALT 23; Hemoglobin 14.1; Platelets 285 12/30/2018: Magnesium 2.3; TSH 1.284 01/02/2019: BUN 19; Creatinine, Ser 0.99; Potassium 3.7; Sodium 140   Recent Lipid Panel Lab Results  Component Value Date/Time   CHOL 212 (H) 12/30/2018 08:17 AM   TRIG 167 (H) 12/30/2018 08:17 AM   HDL 45 12/30/2018 08:17 AM   CHOLHDL 4.7 12/30/2018 08:17 AM   LDLCALC 134 (H) 12/30/2018 08:17 AM   LDLDIRECT 132.0 11/17/2018 04:25 PM    Wt Readings from Last 3 Encounters:  01/16/19 158 lb (71.7 kg)  01/06/19 155 lb 8 oz (70.5 kg)  12/31/18 153 lb 11.2 oz (69.7 kg)     Objective:    Vital Signs:  BP 125/64   Pulse 64   Ht 5' 2.5" (1.588 m)   Wt 158 lb (71.7 kg)   BMI 28.44 kg/m    Vitals reviewed  ASSESSMENT & PLAN:    1.  Hx CP  Atypical   ? GI    Exacerbated by stress  2  CAD   CT scan shows CAD but not critical   Will review lipids with study group and lipd clinic   Has not tried repatha  3  HTN  BP is good   4.  PVOD   Extensive atherosclerosis of abdoiminal vessels   No symptoms   WIll check with lipids  5 HL    Lipds as noted   WIl review  6  COVID-19 Education: The signs and symptoms of COVID-19 were discussed with the patient and how to seek care for testing (follow up with PCP or arrange E-visit).  The importance of social distancing was discussed today.  Time:   Today, I have spent 25 minutes with the patient with telehealth technology discussing the above problems.     Medication Adjustments/Labs and Tests Ordered: Current medicines are reviewed at length with the patient today.  Concerns regarding medicines are outlined above.   Tests Ordered: No orders of the defined types were placed in this encounter.   Medication Changes: No orders of the defined types were placed in this encounter.   Disposition:  Follow up OCtober 2020  SignedDorris Carnes, MD  01/16/2019 9:54 AM    [place]

## 2019-01-16 NOTE — Progress Notes (Signed)
Thank you :)

## 2019-01-20 ENCOUNTER — Telehealth: Payer: Self-pay | Admitting: Pharmacist

## 2019-01-20 NOTE — Telephone Encounter (Signed)
Received referral for PCSK9i therapy from Dr Harrington Challenger. Pt is currently enrolled in the CLEAR outcomes trial for bempedoic acid. She is intolerant to rosuvastatin 5mg  daily, pravastatin 20mg  daily, and Livalo 1mg  daily.  LDL goal < 70 due to chest CT that showed extensive atherosclerosis of aorta and branches and elevated calcium score of 157 with nonobstructive CAD noted.  LDL previously elevated at 220 last year, currently 134 checked 12/30/18. She is tolerating Zetia well and is likely receiving active drug in the CLEAR study due to 39% LDL reduction from last year.  She stays active walking around her neighborhood for 1 mile each day. She also has a stationary bicycle she has been trying to use. Pt will work on decreasing her intake of saturated fats.  Pt is worried about starting an injectable medication since she developed muscle aches from statin therapy and Prolia injections and she does not want to sacrifice her quality of life. She has arthritis and is also worried that she might have trouble with the injections. Anticipated copay for Repatha would be $30-50 per month, pt reports this would not be a problem. She wishes to focus on diet and exercise and her PCP will check lab work in August (she will call to let us know the date of appt so we can follow up with her after). If her LDL remains above goal at that time, she will be willing to try Repatha.

## 2019-01-27 ENCOUNTER — Telehealth: Payer: Self-pay | Admitting: Family Medicine

## 2019-01-27 NOTE — Telephone Encounter (Signed)
See request °

## 2019-01-27 NOTE — Telephone Encounter (Signed)
Copied from [place] [id number]. Topic: Quick Communication - Rx Refill/Question >> Jan 27, 2019  2:19 PM Izola Price, [place] A wrote: Medication: pantoprazole (PROTONIX) 40 MG tablet,aspirin 81 MG chewable tablet ,Omega-3 Fatty Acids (OMEGA III EPA+DHA PO),metoprolol tartrate (LOPRESSOR) 25 MG tablet  Has the patient contacted their pharmacy? Yes (Agent: If no, request that the patient contact the pharmacy for the refill.) (Agent: If yes, when and what did the pharmacy advise?)Contact PCP  Preferred Pharmacy (with phone number or street name): [place], [place]. [phone] (Phone) [phone] (Fax)    Agent: Please be advised that RX refills may take up to 3 business days. We ask that you follow-up with your pharmacy.

## 2019-01-28 NOTE — Telephone Encounter (Signed)
Pt is wondering if she can get this refilled in the morning. She is going out of town tomorrow at 50. [employer]

## 2019-01-29 NOTE — Telephone Encounter (Signed)
See request °

## 2019-01-29 NOTE — Telephone Encounter (Signed)
Spoke to pt and advised that Dr/ hunter has refilled her Rx 01/06/2019 at appt. Time. Pt was also advised that 2 requested Rx are OTC that she can purchase. Pt verbalized understanding. No further action needed.

## 2019-01-29 NOTE — Telephone Encounter (Signed)
Pt states that the omega III she uses is a prescription. She also needs pantoprazole. She is leaving town at [employer]  and needing to pick up her prescriptions before that. These were last prescribed by a hospital doctor.   omega-3 acid ethyl esters (LOVAZA) 1 g capsule pantoprazole (PROTONIX) 40 MG tablet   [place], [place] - [address] (Phone) [phone] (Fax)

## 2019-01-29 NOTE — Telephone Encounter (Signed)
Left message to return phone call.

## 2019-01-29 NOTE — Telephone Encounter (Signed)
Left message for pt to return phone call

## 2019-01-30 ENCOUNTER — Other Ambulatory Visit: Payer: Self-pay

## 2019-01-30 MED ORDER — PANTOPRAZOLE SODIUM 40 MG PO TBEC: 40.0000 mg | DELAYED_RELEASE_TABLET | Freq: Every day | ORAL | 0 refills | Status: DC

## 2019-01-30 NOTE — Telephone Encounter (Signed)
Left message to return phone call.

## 2019-01-30 NOTE — Telephone Encounter (Signed)
Pantoprazole sent to pharmacy. Other Rx can be purchased OTC.

## 2019-03-03 ENCOUNTER — Ambulatory Visit
Admission: RE | Admit: 2019-03-03 | Discharge: 2019-03-03 | Disposition: A | Discharge status: Home / Self Care | Payer: Medicare Other | Source: Ambulatory Visit | Attending: Family Medicine | Admitting: Family Medicine

## 2019-03-03 DIAGNOSIS — Z1231 Encounter for screening mammogram for malignant neoplasm of breast: Secondary | ICD-10-CM

## 2019-04-09 ENCOUNTER — Telehealth: Payer: Self-pay | Admitting: *Deleted

## 2019-04-09 NOTE — Telephone Encounter (Signed)
Called patient for visit [id number] in the clear research study.  Medical record check, [facility]

## 2019-04-21 ENCOUNTER — Encounter: Payer: Medicare Other | Admitting: *Deleted

## 2019-04-21 ENCOUNTER — Other Ambulatory Visit: Payer: Self-pay

## 2019-04-21 VITALS — BP 135/59 | HR 71 | Temp 97.9°F | Wt 158.8 lb

## 2019-04-21 DIAGNOSIS — Z006 Encounter for examination for normal comparison and control in clinical research program: Secondary | ICD-10-CM

## 2019-04-22 NOTE — Research (Signed)
Subject to clinic for unscheduled labs and vital signs. Re-consented:  Clear Informed Consent   Subject Name: [name]  Subject met inclusion and exclusion criteria.  The informed consent form, study requirements and expectations were reviewed with the subject and questions and concerns were addressed prior to the signing of the consent form.  The subject verbalized understanding of the trial requirements.  The subject agreed to participate in the Clear trial and signed the informed consent  on 04/21/2019.  The informed consent was obtained prior to performance of any protocol-specific procedures for the subject.  A copy of the signed informed consent was given to the subject and a copy was placed in the subject's medical record.   [name].   Next appointment scheduled.

## 2019-04-23 ENCOUNTER — Telehealth: Payer: Self-pay | Admitting: Family Medicine

## 2019-04-23 NOTE — Telephone Encounter (Signed)
I left a message asking the patient to call me at [phone] to schedule AWV with Courtney. VDM (Dee-Dee) °

## 2019-04-24 LAB — CBC AND DIFFERENTIAL
HCT: 42 (ref 36–46)
Hemoglobin: 13.4 (ref 12.0–16.0)
Neutrophils Absolute: 4
Platelets: 282 (ref 150–399)
WBC: 5.9

## 2019-04-24 LAB — HEMOGLOBIN A1C: Hemoglobin A1C: 5.5

## 2019-04-24 LAB — HEPATIC FUNCTION PANEL
ALT: 17 (ref 7–35)
AST: 16 (ref 13–35)
Alkaline Phosphatase: 50 (ref 25–125)
Bilirubin, Total: 0.8

## 2019-04-24 LAB — BASIC METABOLIC PANEL
BUN: 22 — AB (ref 4–21)
Creatinine: 0.8 (ref 0.5–1.1)
Glucose: 92
Potassium: 4.2 (ref 3.4–5.3)
Sodium: 142 (ref 137–147)

## 2019-04-29 ENCOUNTER — Encounter: Payer: Self-pay | Admitting: Family Medicine

## 2019-04-29 ENCOUNTER — Other Ambulatory Visit: Payer: Self-pay

## 2019-04-29 ENCOUNTER — Ambulatory Visit (INDEPENDENT_AMBULATORY_CARE_PROVIDER_SITE_OTHER): Payer: Medicare Other | Admitting: Family Medicine

## 2019-04-29 ENCOUNTER — Ambulatory Visit (INDEPENDENT_AMBULATORY_CARE_PROVIDER_SITE_OTHER): Payer: Medicare Other

## 2019-04-29 VITALS — BP 148/76 | Ht 62.5 in | Wt 161.2 lb

## 2019-04-29 VITALS — BP 134/72 | HR 72 | Temp 97.8°F | Ht 62.5 in | Wt 161.2 lb

## 2019-04-29 DIAGNOSIS — E039 Hypothyroidism, unspecified: Secondary | ICD-10-CM

## 2019-04-29 DIAGNOSIS — M810 Age-related osteoporosis without current pathological fracture: Secondary | ICD-10-CM

## 2019-04-29 DIAGNOSIS — Z0000 Encounter for general adult medical examination without abnormal findings: Secondary | ICD-10-CM

## 2019-04-29 DIAGNOSIS — E782 Mixed hyperlipidemia: Secondary | ICD-10-CM

## 2019-04-29 DIAGNOSIS — E559 Vitamin D deficiency, unspecified: Secondary | ICD-10-CM | POA: Diagnosis not present

## 2019-04-29 DIAGNOSIS — Z23 Encounter for immunization: Secondary | ICD-10-CM

## 2019-04-29 DIAGNOSIS — I10 Essential (primary) hypertension: Secondary | ICD-10-CM

## 2019-04-29 DIAGNOSIS — E663 Overweight: Secondary | ICD-10-CM

## 2019-04-29 LAB — COMPREHENSIVE METABOLIC PANEL
ALT: 17 U/L (ref 0–35)
AST: 13 U/L (ref 0–37)
Albumin: 4.7 g/dL (ref 3.5–5.2)
Alkaline Phosphatase: 49 U/L (ref 39–117)
BUN: 22 mg/dL (ref 6–23)
CO2: 31 mEq/L (ref 19–32)
Calcium: 9.6 mg/dL (ref 8.4–10.5)
Chloride: 104 mEq/L (ref 96–112)
Creatinine, Ser: 0.86 mg/dL (ref 0.40–1.20)
GFR: 63.86 mL/min (ref >60.00)
Glucose, Bld: 91 mg/dL (ref 70–99)
Potassium: 3.7 mEq/L (ref 3.5–5.1)
Sodium: 143 mEq/L (ref 135–145)
Total Bilirubin: 0.7 mg/dL (ref 0.2–1.2)
Total Protein: 6.6 g/dL (ref 6.0–8.3)

## 2019-04-29 LAB — VITAMIN D 25 HYDROXY (VIT D DEFICIENCY, FRACTURES): VITD: 43.55 ng/mL (ref 30.00–100.00)

## 2019-04-29 LAB — CBC
HCT: 41.6 % (ref 36.0–46.0)
Hemoglobin: 13.5 g/dL (ref 12.0–15.0)
MCHC: 32.6 g/dL (ref 30.0–36.0)
MCV: 91.5 fl (ref 78.0–100.0)
Platelets: 272 10*3/uL (ref 150.0–400.0)
RBC: 4.55 Mil/uL (ref 3.87–5.11)
RDW: 12.7 % (ref 11.5–15.5)
WBC: 7.2 10*3/uL (ref 4.0–10.5)

## 2019-04-29 LAB — TSH: TSH: 1.13 u[IU]/mL (ref 0.35–4.50)

## 2019-04-29 LAB — LDL CHOLESTEROL, DIRECT: Direct LDL: 114 mg/dL

## 2019-04-29 MED ORDER — FAMOTIDINE 20 MG PO TABS: 20.0000 mg | ORAL_TABLET | Freq: Two times a day (BID) | ORAL | 3 refills | Status: DC

## 2019-04-29 NOTE — Progress Notes (Signed)
I have reviewed and agree with note, evaluation, plan.  See my separate note from today  Narya Beavin, MD  

## 2019-04-29 NOTE — Progress Notes (Signed)
Subjective:   [name] is a 78 y.o. female who presents for Medicare Annual (Subsequent) preventive examination.  Review of Systems:   Cardiac Risk Factors include: dyslipidemia;hypertension     Objective:     Vitals: BP (!) 148/76 (BP Location: Right Arm, Cuff Size: Normal)   Ht 5' 2.5" (1.588 m)   Wt 161 lb 3.2 oz (73.1 kg)   BMI 29.01 kg/m   Body mass index is 29.01 kg/m.  Advanced Directives 04/29/2019 12/29/2018 12/29/2018 03/06/2018 11/23/2016 06/27/2016 12/10/2015  Does Patient Have a Medical Advance Directive? Yes Yes Yes Yes Yes Yes No  Type of Paramedic of [place];Living will Living will [place];Living will - - [place];Living will -  Does patient want to make changes to medical advance directive? No - Patient declined No - Patient declined - - - No - Patient declined -  Copy of [place] in Chart? No - copy requested - - - - No - copy requested -  Would patient like information on creating a medical advance directive? - - - - - - No - patient declined information    Tobacco Social History   Tobacco Use  Smoking Status Never Smoker  Smokeless Tobacco Never Used      Clinical Intake:  Pre-visit preparation completed: Yes  Pain : No/denies pain  Diabetes: No  How often do you need to have someone help you when you read instructions, pamphlets, or other written materials from your doctor or pharmacy?: 1 - Never  Interpreter Needed?: No  Information entered by :: Denman George LPN  Past Medical History:  Diagnosis Date  . Abnormal thyroid function test 07/01/2013  . Anxiety   . Arthritis   . Cancer ([place])   . Dermatitis 11/25/2013  . Glaucoma 11/02/2012  . HTN (hypertension) 11/02/2012  . 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 Hyperlipidemia   . Hypothyroid   . Obesity    Past Surgical History:  Procedure Laterality Date   . AUGMENTATION MAMMAPLASTY Left    05/2006  . BREAST SURGERY  10/2005   Left Mastectomy, reconstruction, reduction on right. Dr. Harlow Mares  . COLONOSCOPY    . CRYOTHERAPY     GYN for CIN III  . DILATION AND CURETTAGE OF UTERUS    . EXCISION MORTON'S NEUROMA     right foot  . EYE SURGERY Right 05/08/2018  . REDUCTION MAMMAPLASTY Right    Family History  Problem Relation Age of Onset  . Pneumonia Mother   . Hyperlipidemia Mother   . Parkinsonism Mother   . CAD Mother   . Other Father        bilateral subdural hematoma  . Colon cancer Paternal Grandmother   . Hyperlipidemia Sister   . Prostate cancer Neg Hx   . Breast cancer Neg Hx   . Heart disease Neg Hx   . Diabetes Neg Hx    Social History   Socioeconomic History  . Marital status: Widowed    Spouse name: Not on file  . Number of children: 1  . Years of education: Not on file  . Highest education level: Not on file  Occupational History  . Not on file  Social Needs  . Financial resource strain: Not on file  . Food insecurity    Worry: Not on file    Inability: Not on file  . Transportation needs  Medical: Not on file    Non-medical: Not on file  Tobacco Use  . Smoking status: Never Smoker  . Smokeless tobacco: Never Used  Substance and Sexual Activity  . Alcohol use: Yes    Comment: Rare  . Drug use: No  . Sexual activity: Not on file  Lifestyle  . Physical activity    Days per week: Not on file    Minutes per session: Not on file  . Stress: Not on file  Relationships  . Social Herbalist on phone: Not on file    Gets together: Not on file    Attends religious service: Not on file    Active member of club or organization: Not on file    Attends meetings of clubs or organizations: Not on file    Relationship status: Not on file  Other Topics Concern  . Not on file  Social History Narrative   Married 1981. 1 daughter, 2 step sons. 4 grandchildren      Kindergarten teacher       Hobbies: going out to eat, activities with church including cancer support group at church    Outpatient Encounter Medications as of 04/29/2019  Medication Sig  . AMBULATORY NON FORMULARY MEDICATION Take 180 mg by mouth daily. Medication Name: 180 mg bempedoic acid vs a placebo, CLEAR Research Study drug provided  . Bimatoprost (LUMIGAN OP) Place 1 drop into both eyes at bedtime.   . chlorthalidone (HYGROTON) 25 MG tablet TAKE (1/2) TABLET DAILY.  Marland Kitchen Cholecalciferol (VITAMIN D-3) 5000 UNITS TABS Take 5,000 mg by mouth daily.  . dorzolamide-timolol (COSOPT) 22.3-6.8 MG/ML ophthalmic solution Place 1 drop into the left eye 2 (two) times daily.   Marland Kitchen ezetimibe (ZETIA) 10 MG tablet Take 1 tablet (10 mg total) by mouth daily.  Javier Docker Oil 1000 MG CAPS Take 1,000 mg by mouth daily. Mega Red   . levothyroxine (SYNTHROID) 50 MCG tablet Take 1 tablet (50 mcg total) by mouth daily before breakfast.  . metoprolol tartrate (LOPRESSOR) 25 MG tablet Take 1 tablet (25 mg total) by mouth 2 (two) times daily for 30 days.  . Omega-3 Fatty Acids (OMEGA III EPA+DHA PO) Take 1 capsule by mouth daily.   . pantoprazole (PROTONIX) 40 MG tablet Take 1 tablet (40 mg total) by mouth daily at 6 (six) AM for 30 days.  . pilocarpine (PILOCAR) 1 % ophthalmic solution Place 1 drop into the left eye 2 (two) times daily.  Vladimir Faster Glycol-Propyl Glycol (SYSTANE OP) Place 1-2 drops into both eyes as needed (for dry eyes).   . Probiotic Product (PROBIOTIC DAILY) CAPS Take 1 capsule by mouth daily. Reported on 10/10/2015   No facility-administered encounter medications on file as of 04/29/2019.     Activities of Daily Living In your present state of health, do you have any difficulty performing the following activities: 04/29/2019 12/29/2018  Hearing? N N  Vision? N N  Difficulty concentrating or making decisions? N N  Walking or climbing stairs? N N  Dressing or bathing? N N  Doing errands, shopping? N N  Preparing Food and  eating ? N -  Using the Toilet? N -  In the past six months, have you accidently leaked urine? N -  Do you have problems with loss of bowel control? N -  Managing your Medications? N -  Managing your Finances? N -  Some recent data might be hidden    Patient Care Team: Marin Olp,  MD as PCP - General (Family Medicine) Fay Records, MD as PCP - Cardiology (Cardiology) Magrinat, Virgie Dad, MD (Hematology and Oncology) Neldon Mc, MD (General Surgery) Crissie Reese, MD (Plastic Surgery) Clent Jacks, MD as Consulting Physician (Ophthalmology)    Assessment:   This is a routine wellness examination for Adele.  Exercise Activities and Dietary recommendations Current Exercise Habits: Home exercise routine, Type of exercise: Other - see comments(Stationary Bicycle), Time (Minutes): 30, Frequency (Times/Week): 5, Weekly Exercise (Minutes/Week): 150, Intensity: Mild  Goals    . Patient Stated     Continue to stay active in lunch Lunch bunch     . Weight (lb) < 200 lb (90.7 kg)     Will go back to weight watchers Will start exercising with spouse for weight bearing Will buddy up with your friend        Fall Risk Fall Risk  04/29/2019 03/06/2018 11/23/2016 11/01/2016 10/10/2015  Falls in the past year? 0 No - Yes No  Number falls in past yr: 0 - 1 1 -  Injury with Fall? 0 - - Yes -  Follow up - - Education provided - -   Timed Get Up and Go performed: completed and within normal timeframe   Depression Screen PHQ 2/9 Scores 04/29/2019 11/17/2018 03/06/2018 11/23/2016  PHQ - 2 Score 0 2 0 0  PHQ- 9 Score - 4 - -     Cognitive Function MMSE - Mini Mental State Exam 03/06/2018 11/23/2016  Not completed: (No Data) (No Data)   No cognitive concerns at this time; patient with no memory concerns   Immunization History  Administered Date(s) Administered  . Fluad Quad(high Dose 65+) 04/29/2019  . Influenza Split 07/24/2011  . Influenza Whole 06/15/2009  . Influenza, High  Dose Seasonal PF 06/29/2013, 07/05/2015, 05/10/2016, 05/21/2017, 06/18/2018  . Influenza,inj,Quad PF,6+ Mos 06/21/2014, 06/14/2016  . Pneumococcal Conjugate-13 04/15/2014  . Pneumococcal Polysaccharide-23 07/17/2000, 10/02/2016  . Tdap 07/17/2010  . Zoster Recombinat (Shingrix) 10/15/2018, 03/17/2019    Screening Tests Health Maintenance  Topic Date Due  . INFLUENZA VACCINE  04/11/2019  . TETANUS/TDAP  07/17/2020  . COLONOSCOPY  09/23/2023  . DEXA SCAN  Completed  . PNA vac Low Risk Adult  Completed    Cancer Screenings: Breast:  Up to date on Mammogram?  Yes   Up to date of Bone Density/Dexa? No; patient declines at this time, previously taking Prolia  Colorectal: completed 09/22/18 with Dr. Fuller Plan        Plan:    I have personally reviewed and addressed the Medicare Annual Wellness questionnaire and have noted the following in the patient's chart:  A. Medical and social history B. Use of alcohol, tobacco or illicit drugs  C. Current medications and supplements D. Functional ability and status E.  Nutritional status F.  Physical activity G. Advance directives H. List of other physicians I.  Hospitalizations, surgeries, and ER visits in previous 12 months J.  [place] such as hearing and vision if needed, cognitive and depression L. Referrals, records requested, and appointments- none   In addition, I have reviewed and discussed with patient certain preventive protocols, quality metrics, and best practice recommendations. A written personalized care plan for preventive services as well as general preventive health recommendations were provided to patient.   Signed,  Denman George, LPN  Nurse Health Advisor    Nurse Notes: no additions

## 2019-04-29 NOTE — Patient Instructions (Addendum)
Health Maintenance Due  Topic Date Due  . INFLUENZA VACCINE - done today.  04/11/2019   Trial pepcid for your reflux- sent this into pharmacy but may simply be over the counter  Please stop by lab before you go If you do not have mychart- we will call you about results within 5 business days of [place] receiving them.  If you have mychart- we will send your results within 3 business days of [place] receiving them.  If abnormal or we want to clarify a result, we will call or mychart you to make sure you receive the message.  If you have questions or concerns or don't hear within 5-7 days, please send [place] a message or call us.

## 2019-04-29 NOTE — Progress Notes (Signed)
Phone: [phone]   Subjective:  Patient presents today for their annual physical. Chief complaint-noted.   See problem oriented charting- ROS- full  review of systems was completed and negative except for: still with some grieving after loss of husband 1.5 years ago- really misses him but doing better  The following were reviewed and entered/updated in epic: Past Medical History:  Diagnosis Date   Abnormal thyroid function test 07/01/2013   Anxiety    Arthritis    Cancer ([place])    Dermatitis 11/25/2013   Glaucoma 11/02/2012   HTN (hypertension) 11/02/2012   hx: breast cancer, IDC (Microinvasive) w DCIS, 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 - 07/10/2011   Hyperlipidemia    Hypothyroid    Obesity    Patient Active Problem List   Diagnosis Date Noted   Grieving 02/18/2017    Priority: High   Osteoporosis 11/05/2016    Priority: High   Chest pain 12/29/2018    Priority: Medium   Primary open-angle glaucoma 11/20/2017    Priority: Medium   Hypothyroidism 07/01/2013    Priority: Medium   Essential hypertension 11/02/2012    Priority: Medium   Hyperlipidemia, mixed 07/24/2011    Priority: Medium   hx: breast cancer, IDC (Microinvasive) w DCIS, 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 - 07/10/2011    Priority: Medium   History of adenomatous polyp of colon 09/30/2018    Priority: Low   Medicare annual wellness visit, subsequent 09/26/2014    Priority: Low   Vitamin D deficiency 11/02/2012    Priority: Low   Obesity (BMI 30-39.9) 07/24/2011    Priority: Low   Pseudophakia of both eyes 11/20/2017   Past Surgical History:  Procedure Laterality Date   AUGMENTATION MAMMAPLASTY Left    05/2006   BREAST SURGERY  10/2005   Left Mastectomy, reconstruction, reduction on right. Dr. Harlow Mares   COLONOSCOPY     CRYOTHERAPY     GYN for CIN III   DILATION AND CURETTAGE OF UTERUS     EXCISION MORTON'S NEUROMA     right foot   EYE SURGERY Right 05/08/2018   REDUCTION MAMMAPLASTY Right      Family History  Problem Relation Age of Onset   Pneumonia Mother    Hyperlipidemia Mother    Parkinsonism Mother    CAD Mother    Other Father        bilateral subdural hematoma   Colon cancer Paternal Grandmother    Hyperlipidemia Sister    Prostate cancer Neg Hx    Breast cancer Neg Hx    Heart disease Neg Hx    Diabetes Neg Hx     Medications- reviewed and updated Current Outpatient Medications  Medication Sig Dispense Refill   AMBULATORY NON FORMULARY MEDICATION Take 180 mg by mouth daily. Medication Name: 180 mg bempedoic acid vs a placebo, CLEAR Research Study drug provided     Bimatoprost (LUMIGAN OP) Place 1 drop into both eyes at bedtime.      chlorthalidone (HYGROTON) 25 MG tablet TAKE (1/2) TABLET DAILY. 45 tablet 3   Cholecalciferol (VITAMIN D-3) 5000 UNITS TABS Take 5,000 mg by mouth daily.     dorzolamide-timolol (COSOPT) 22.3-6.8 MG/ML ophthalmic solution Place 1 drop into the left eye 2 (two) times daily.      ezetimibe (ZETIA) 10 MG tablet Take 1 tablet (10 mg total) by mouth daily. 90 tablet 3   Krill Oil 1000 MG CAPS Take 1,000 mg by mouth daily. Mega Red      levothyroxine (SYNTHROID) 50  MCG tablet Take 1 tablet (50 mcg total) by mouth daily before breakfast. 90 tablet 3   Omega-3 Fatty Acids (OMEGA III EPA+DHA PO) Take 1 capsule by mouth daily.      pilocarpine (PILOCAR) 1 % ophthalmic solution Place 1 drop into the left eye 2 (two) times daily.     Polyethyl Glycol-Propyl Glycol (SYSTANE OP) Place 1-2 drops into both eyes as needed (for dry eyes).      Probiotic Product (PROBIOTIC DAILY) CAPS Take 1 capsule by mouth daily. Reported on 10/10/2015     famotidine (PEPCID) 20 MG tablet Take 1 tablet (20 mg total) by mouth 2 (two) times daily. 180 tablet 3   metoprolol tartrate (LOPRESSOR) 25 MG tablet Take 1 tablet (25 mg total) by mouth 2 (two) times daily for 30 days. 180 tablet 3   pantoprazole (PROTONIX) 40 MG tablet Take 1 tablet  (40 mg total) by mouth daily at 6 (six) AM for 30 days. 30 tablet 0   No current facility-administered medications for this visit.     Allergies-reviewed and updated Allergies  Allergen Reactions   Adhesive [Tape] Other (See Comments)    Causes skin to turn red where touched   Rosuvastatin Other (See Comments)    Joint & muscle aches, sleep issues.    Social History   Social History Narrative   Married 1981. 1 daughter, 2 step sons. 4 grandchildren      Oncologist      Hobbies: going out to eat, activities with church including cancer support group at church   Objective  Objective:  BP 134/72 Comment: average over 37 home readings   Pulse 72    Temp 97.8 F (36.6 C) (Oral)    Ht 5' 2.5" (1.588 m)    Wt 161 lb 3.2 oz (73.1 kg)    SpO2 98%    BMI 29.01 kg/m  Gen: NAD, resting comfortably HEENT: Mucous membranes are moist. Oropharynx normal Neck: no thyromegaly or cervical lympahdenopathy CV: RRR no murmurs rubs or gallops Lungs: CTAB no crackles, wheeze, rhonchi Abdomen: soft/nontender/nondistended/normal bowel sounds. No rebound or guarding. overweight Ext: no edema Skin: warm, dry Neuro: grossly normal, moves all extremities, PERRLA    Assessment and Plan   78 y.o. female presenting for annual physical.  Health Maintenance counseling: 1. Anticipatory guidance: Patient counseled regarding regular dental exams -q3-6 months, eye exams - yearly or sooner for glaucoma follow up,  avoiding smoking and second hand smoke , limiting alcohol to 1 beverage per day .   2. Risk factor reduction:  Advised patient of need for regular exercise and diet rich and fruits and vegetables to reduce risk of heart attack and stroke. Exercise- exercising on her bicycle at home. Diet-reasonably healthy diet with covid- encouraged her to improve this as possible. Noted oerweight- up 8 lbs since April- target back into 150s within 6 months Wt Readings from Last 3 Encounters:  04/29/19  161 lb 3.2 oz (73.1 kg)  04/29/19 161 lb 3.2 oz (73.1 kg)  04/21/19 158 lb 12.8 oz (72 kg)  3. Immunizations/screenings/ancillary studies Immunization History  Administered Date(s) Administered   Fluad Quad(high Dose 65+) 04/29/2019   Influenza Split 07/24/2011   Influenza Whole 06/15/2009   Influenza, High Dose Seasonal PF 06/29/2013, 07/05/2015, 05/10/2016, 05/21/2017, 06/18/2018   Influenza,inj,Quad PF,6+ Mos 06/21/2014, 06/14/2016   Pneumococcal Conjugate-13 04/15/2014   Pneumococcal Polysaccharide-23 07/17/2000, 10/02/2016   Tdap 07/17/2010   Zoster Recombinat (Shingrix) 10/15/2018, 03/17/2019  4. Cervical cancer screening- normal  pap smear age 68- would not advise repeat  5. Breast cancer screening-  breast exam declined and mammogram 03/03/2019 hahd mammogram 6. Colon cancer screening - colonoscopy 09/22/2018 with consideration of 5 year follow up due to polyp history 7. Skin cancer screening- considering follow up with Dr. Martinique- advised regular sunscreen use. Denies worrisome, changing, or new skin lesions.  8. Birth control/STD check- postmenopausal/not active 9. Osteoporosis screening at 21- a lot of dental issues so was not thought to be a candidate for bisphospohnates.  achy all over with last prolia- wants to discontinue permanently. Consider bone density next year although not clear what benefit will be since she cannot tolerate medications -never smoker  Status of chronic or acute concerns   Doing a good job with social distancing- trying to stay at home. Still grieving- but gets out in yard when feels down and really helps her. Now 1.5 years since loss of her husband. She is calling and supporting folks from church which brings her energy  Hypertension - Taking Chlorthalidone 25 mg 1/2 tablet daily and Metoprolol 25 mg BID. Checking BP at home, running average of 134/72 over 37 readings. Denies HA, dizziness, CP, SOB, visual changes. Salts foods in moderation. Eats  more take out since husband passed away.  - home value input given excellent monitoring -verified home cuff today (wrist cuff but appears accurate)  Hypothyroidism - Levothyroxine 50 mcg daily. Denies changes skin, nails, constipation, diarrhea.  Has noticed some thinning of her hair.   Hyperlipidemia - Taking Zetia 10 mg daily. In research study for cholesterol medication- reports #s came down significantly to 130 total. Too soon for full panel- will check bad cholesterol. Possible they will cover medicine after the next year- she is hoepful- we will get direct LDL  Hx of Breast Cancer - mammogram 03/03/2019  GERD- pantoprazole helped at 40mg  but we are trying to keep her off if possible due to osteoporosis. Trial pepcid 20mg  twice a day- if not effective she will let us know and may try pantoprazole 20mg .   Recommended follow up: 3 months  Lab/Order associations: fasting    ICD-10-CM   1. Preventative health care  Z00.00   2. Essential hypertension  I10   3. Hypothyroidism, unspecified type  E03.9   4. Age-related osteoporosis without current pathological fracture  M81.0   5. Hyperlipidemia, mixed  E78.2   6. Need for influenza vaccination  Z23 Flu Vaccine QUAD High Dose(Fluad)  7. Overweight  E66.3     Meds ordered this encounter  Medications   famotidine (PEPCID) 20 MG tablet    Sig: Take 1 tablet (20 mg total) by mouth 2 (two) times daily.    Dispense:  180 tablet    Refill:  3   Return precautions advised.  Garret Reddish, MD

## 2019-04-29 NOTE — Patient Instructions (Signed)
[name] , Thank you for taking time to come for your Medicare Wellness Visit. I appreciate your ongoing commitment to your health goals. Please review the following plan we discussed and let me know if I can assist you in the future.   Screening recommendations/referrals: Colorectal Screening: completed 09/22/18 Mammogram: up to date; last 03/03/19 Bone Density: declined   Vision and Dental Exams: Recommended annual ophthalmology exams for early detection of glaucoma and other disorders of the eye Recommended annual dental exams for proper oral hygiene  Vaccinations: Influenza vaccine: received today  Pneumococcal vaccine: completed Tdap vaccine: last 07/17/10  Shingles vaccine: completed  Advanced directives:  Please bring a copy of your POA (Power of [place]) and/or Living Will to your next appointment.  Goals: Reduce blood pressure and remain active   Next appointment: Please schedule your Annual Wellness Visit with your Nurse Health Advisor in one year.  Preventive Care 20 Years and Older, Female Preventive care refers to lifestyle choices and visits with your health care provider that can promote health and wellness. What does preventive care include?  A yearly physical exam. This is also called an annual well check.  Dental exams once or twice a year.  Routine eye exams. Ask your health care provider how often you should have your eyes checked.  Personal lifestyle choices, including:  Daily care of your teeth and gums.  Regular physical activity.  Eating a healthy diet.  Avoiding tobacco and drug use.  Limiting alcohol use.  Practicing safe sex.  Taking low-dose aspirin every day if recommended by your health care provider.  Taking vitamin and mineral supplements as recommended by your health care provider. What happens during an annual well check? The services and screenings done by your health care provider during your annual well check will depend on  your age, overall health, lifestyle risk factors, and family history of disease. Counseling  Your health care provider may ask you questions about your:  Alcohol use.  Tobacco use.  Drug use.  Emotional well-being.  Home and relationship well-being.  Sexual activity.  Eating habits.  History of falls.  Memory and ability to understand (cognition).  Work and work Statistician.  Reproductive health. Screening  You may have the following tests or measurements:  Height, weight, and BMI.  Blood pressure.  Lipid and cholesterol levels. These may be checked every 5 years, or more frequently if you are over 24 years old.  Skin check.  Lung cancer screening. You may have this screening every year starting at age 53 if you have a 30-pack-year history of smoking and currently smoke or have quit within the past 15 years.  Fecal occult blood test (FOBT) of the stool. You may have this test every year starting at age 61.  Flexible sigmoidoscopy or colonoscopy. You may have a sigmoidoscopy every 5 years or a colonoscopy every 10 years starting at age 14.  Hepatitis C blood test.  Hepatitis B blood test.  Sexually transmitted disease (STD) testing.  Diabetes screening. This is done by checking your blood sugar (glucose) after you have not eaten for a while (fasting). You may have this done every 1-3 years.  Bone density scan. This is done to screen for osteoporosis. You may have this done starting at age 24.  Mammogram. This may be done every 1-2 years. Talk to your health care provider about how often you should have regular mammograms. Talk with your health care provider about your test results, treatment options, and if  necessary, the need for more tests. Vaccines  Your health care provider may recommend certain vaccines, such as:  Influenza vaccine. This is recommended every year.  Tetanus, diphtheria, and acellular pertussis (Tdap, Td) vaccine. You may need a Td booster  every 10 years.  Zoster vaccine. You may need this after age 75.  Pneumococcal 13-valent conjugate (PCV13) vaccine. One dose is recommended after age 33.  Pneumococcal polysaccharide (PPSV23) vaccine. One dose is recommended after age 12. Talk to your health care provider about which screenings and vaccines you need and how often you need them. This information is not intended to replace advice given to you by your health care provider. Make sure you discuss any questions you have with your health care provider. Document Released: 09/23/2015 Document Revised: 05/16/2016 Document Reviewed: 06/28/2015 Elsevier Interactive Patient Education  2017 [place] Prevention in the Home Falls can cause injuries. They can happen to people of all ages. There are many things you can do to make your home safe and to help prevent falls. What can I do on the outside of my home?  Regularly fix the edges of walkways and driveways and fix any cracks.  Remove anything that might make you trip as you walk through a door, such as a raised step or threshold.  Trim any bushes or trees on the path to your home.  Use bright outdoor lighting.  Clear any walking paths of anything that might make someone trip, such as rocks or tools.  Regularly check to see if handrails are loose or broken. Make sure that both sides of any steps have handrails.  Any raised decks and porches should have guardrails on the edges.  Have any leaves, snow, or ice cleared regularly.  Use sand or salt on walking paths during winter.  Clean up any spills in your garage right away. This includes oil or grease spills. What can I do in the bathroom?  Use night lights.  Install grab bars by the toilet and in the tub and shower. Do not use towel bars as grab bars.  Use non-skid mats or decals in the tub or shower.  If you need to sit down in the shower, use a plastic, non-slip stool.  Keep the floor dry. Clean up any  water that spills on the floor as soon as it happens.  Remove soap buildup in the tub or shower regularly.  Attach bath mats securely with double-sided non-slip rug tape.  Do not have throw rugs and other things on the floor that can make you trip. What can I do in the bedroom?  Use night lights.  Make sure that you have a light by your bed that is easy to reach.  Do not use any sheets or blankets that are too big for your bed. They should not hang down onto the floor.  Have a firm chair that has side arms. You can use this for support while you get dressed.  Do not have throw rugs and other things on the floor that can make you trip. What can I do in the kitchen?  Clean up any spills right away.  Avoid walking on wet floors.  Keep items that you use a lot in easy-to-reach places.  If you need to reach something above you, use a strong step stool that has a grab bar.  Keep electrical cords out of the way.  Do not use floor polish or wax that makes floors slippery. If you must  use wax, use non-skid floor wax.  Do not have throw rugs and other things on the floor that can make you trip. What can I do with my stairs?  Do not leave any items on the stairs.  Make sure that there are handrails on both sides of the stairs and use them. Fix handrails that are broken or loose. Make sure that handrails are as long as the stairways.  Check any carpeting to make sure that it is firmly attached to the stairs. Fix any carpet that is loose or worn.  Avoid having throw rugs at the top or bottom of the stairs. If you do have throw rugs, attach them to the floor with carpet tape.  Make sure that you have a light switch at the top of the stairs and the bottom of the stairs. If you do not have them, ask someone to add them for you. What else can I do to help prevent falls?  Wear shoes that:  Do not have high heels.  Have rubber bottoms.  Are comfortable and fit you well.  Are closed  at the toe. Do not wear sandals.  If you use a stepladder:  Make sure that it is fully opened. Do not climb a closed stepladder.  Make sure that both sides of the stepladder are locked into place.  Ask someone to hold it for you, if possible.  Clearly mark and make sure that you can see:  Any grab bars or handrails.  First and last steps.  Where the edge of each step is.  Use tools that help you move around (mobility aids) if they are needed. These include:  Canes.  Walkers.  Scooters.  Crutches.  Turn on the lights when you go into a dark area. Replace any light bulbs as soon as they burn out.  Set up your furniture so you have a clear path. Avoid moving your furniture around.  If any of your floors are uneven, fix them.  If there are any pets around you, be aware of where they are.  Review your medicines with your doctor. Some medicines can make you feel dizzy. This can increase your chance of falling. Ask your doctor what other things that you can do to help prevent falls. This information is not intended to replace advice given to you by your health care provider. Make sure you discuss any questions you have with your health care provider. Document Released: 06/23/2009 Document Revised: 02/02/2016 Document Reviewed: 10/01/2014 Elsevier Interactive Patient Education  2017 [employer].

## 2019-05-07 ENCOUNTER — Telehealth: Payer: Self-pay | Admitting: Pharmacist

## 2019-05-07 NOTE — Telephone Encounter (Signed)
Called pt to follow up with lipids. We last spoke in May 2020 after PCSK9i referral from Dr Harrington Challenger. Pt is in the CLEAR trial with bempedoic acid as well as Zetia and fish oil. Baseline LDL 202, currently improved to 114 after most recent lipid panel at PCP office. Pt does not wish to pursue PCSK9i since she is doing well with her current medications and she has had severe joint pain on multiple statins in the past. She is riding her bike and walking to stay active. Will continue current medications. Advised pt to call clinic if she does wish to pursue PCSK9i therapy in the future.

## 2019-05-31 ENCOUNTER — Inpatient Hospital Stay (HOSPITAL_COMMUNITY)
Admission: EM | Admit: 2019-05-31 | Discharge: 2019-06-02 | DRG: 247 | Disposition: A | Discharge status: Home / Self Care | Payer: Medicare Other | Attending: Internal Medicine | Admitting: Internal Medicine

## 2019-05-31 ENCOUNTER — Encounter (HOSPITAL_COMMUNITY): Payer: Self-pay

## 2019-05-31 ENCOUNTER — Emergency Department (HOSPITAL_COMMUNITY): Payer: Medicare Other

## 2019-05-31 ENCOUNTER — Other Ambulatory Visit: Payer: Self-pay

## 2019-05-31 DIAGNOSIS — Z955 Presence of coronary angioplasty implant and graft: Secondary | ICD-10-CM

## 2019-05-31 DIAGNOSIS — Z79890 Hormone replacement therapy (postmenopausal): Secondary | ICD-10-CM

## 2019-05-31 DIAGNOSIS — I10 Essential (primary) hypertension: Secondary | ICD-10-CM | POA: Diagnosis present

## 2019-05-31 DIAGNOSIS — E785 Hyperlipidemia, unspecified: Secondary | ICD-10-CM

## 2019-05-31 DIAGNOSIS — Z8249 Family history of ischemic heart disease and other diseases of the circulatory system: Secondary | ICD-10-CM

## 2019-05-31 DIAGNOSIS — Z800 Family history of malignant neoplasm of digestive organs: Secondary | ICD-10-CM

## 2019-05-31 DIAGNOSIS — H409 Unspecified glaucoma: Secondary | ICD-10-CM | POA: Diagnosis present

## 2019-05-31 DIAGNOSIS — Z820 Family history of epilepsy and other diseases of the nervous system: Secondary | ICD-10-CM

## 2019-05-31 DIAGNOSIS — Z853 Personal history of malignant neoplasm of breast: Secondary | ICD-10-CM

## 2019-05-31 DIAGNOSIS — Z20828 Contact with and (suspected) exposure to other viral communicable diseases: Secondary | ICD-10-CM | POA: Diagnosis present

## 2019-05-31 DIAGNOSIS — Z9012 Acquired absence of left breast and nipple: Secondary | ICD-10-CM

## 2019-05-31 DIAGNOSIS — I214 Non-ST elevation (NSTEMI) myocardial infarction: Principal | ICD-10-CM | POA: Diagnosis present

## 2019-05-31 DIAGNOSIS — Z8349 Family history of other endocrine, nutritional and metabolic diseases: Secondary | ICD-10-CM

## 2019-05-31 DIAGNOSIS — Z7982 Long term (current) use of aspirin: Secondary | ICD-10-CM

## 2019-05-31 DIAGNOSIS — E876 Hypokalemia: Secondary | ICD-10-CM | POA: Diagnosis present

## 2019-05-31 DIAGNOSIS — I2510 Atherosclerotic heart disease of native coronary artery without angina pectoris: Secondary | ICD-10-CM

## 2019-05-31 DIAGNOSIS — E039 Hypothyroidism, unspecified: Secondary | ICD-10-CM | POA: Diagnosis present

## 2019-05-31 LAB — CBC
HCT: 43.6 % (ref 36.0–46.0)
Hemoglobin: 14.4 g/dL (ref 12.0–15.0)
MCH: 30.6 pg (ref 26.0–34.0)
MCHC: 33 g/dL (ref 30.0–36.0)
MCV: 92.8 fL (ref 80.0–100.0)
Platelets: 301 10*3/uL (ref 150–400)
RBC: 4.7 MIL/uL (ref 3.87–5.11)
RDW: 11.9 % (ref 11.5–15.5)
WBC: 11.4 10*3/uL — ABNORMAL HIGH (ref 4.0–10.5)
nRBC: 0 % (ref 0.0–0.2)

## 2019-05-31 LAB — HEPARIN LEVEL (UNFRACTIONATED): Heparin Unfractionated: 0.49 IU/mL (ref 0.30–0.70)

## 2019-05-31 LAB — BASIC METABOLIC PANEL
Anion gap: 10 (ref 5–15)
BUN: 21 mg/dL (ref 8–23)
CO2: 27 mmol/L (ref 22–32)
Calcium: 9.6 mg/dL (ref 8.9–10.3)
Chloride: 104 mmol/L (ref 98–111)
Creatinine, Ser: 0.89 mg/dL (ref 0.44–1.00)
GFR calc Af Amer: >60 mL/min (ref >60)
GFR calc non Af Amer: >60 mL/min (ref >60)
Glucose, Bld: 111 mg/dL — ABNORMAL HIGH (ref 70–99)
Potassium: 3.5 mmol/L (ref 3.5–5.1)
Sodium: 141 mmol/L (ref 135–145)

## 2019-05-31 LAB — TROPONIN I (HIGH SENSITIVITY)
Troponin I (High Sensitivity): 348 ng/L (ref <18)
Troponin I (High Sensitivity): 616 ng/L (ref <18)

## 2019-05-31 LAB — SARS CORONAVIRUS 2 BY RT PCR (HOSPITAL ORDER, PERFORMED IN ~~LOC~~ HOSPITAL LAB): SARS Coronavirus 2: NEGATIVE

## 2019-05-31 MED ORDER — ASPIRIN EC 81 MG PO TBEC
81.0000 mg | DELAYED_RELEASE_TABLET | Freq: Every day | ORAL | Status: DC
  Administered 2019-06-02: 81 mg via ORAL
  Filled 2019-05-31 (×2): qty 1

## 2019-05-31 MED ORDER — SODIUM CHLORIDE 0.9 % WEIGHT BASED INFUSION
3.0000 mL/kg/h | INTRAVENOUS | Status: AC
  Administered 2019-06-01: 3 mL/kg/h via INTRAVENOUS

## 2019-05-31 MED ORDER — ACETAMINOPHEN 325 MG PO TABS: 650.0000 mg | ORAL_TABLET | ORAL | Status: DC | PRN

## 2019-05-31 MED ORDER — BIMATOPROST 0.01 % OP SOLN: 1.0000 [drp] | Freq: Every day | OPHTHALMIC | Status: DC

## 2019-05-31 MED ORDER — ONDANSETRON HCL 4 MG/2ML IJ SOLN: 4.0000 mg | Freq: Four times a day (QID) | INTRAMUSCULAR | Status: DC | PRN

## 2019-05-31 MED ORDER — NITROGLYCERIN 0.4 MG SL SUBL: 0.4000 mg | SUBLINGUAL_TABLET | SUBLINGUAL | Status: DC | PRN

## 2019-05-31 MED ORDER — HEPARIN (PORCINE) 25000 UT/250ML-% IV SOLN
800.0000 [IU]/h | INTRAVENOUS | Status: DC
  Administered 2019-05-31: 800 [IU]/h via INTRAVENOUS
  Filled 2019-05-31: qty 250

## 2019-05-31 MED ORDER — PILOCARPINE HCL 1 % OP SOLN
1.0000 [drp] | Freq: Two times a day (BID) | OPHTHALMIC | Status: DC
  Administered 2019-06-01: 1 [drp] via OPHTHALMIC
  Filled 2019-05-31: qty 15

## 2019-05-31 MED ORDER — SODIUM CHLORIDE 0.9% FLUSH
3.0000 mL | Freq: Two times a day (BID) | INTRAVENOUS | Status: DC
  Administered 2019-06-01: 3 mL via INTRAVENOUS

## 2019-05-31 MED ORDER — ASPIRIN 81 MG PO CHEW
81.0000 mg | CHEWABLE_TABLET | ORAL | Status: AC
  Administered 2019-06-01: 81 mg via ORAL
  Filled 2019-05-31: qty 1

## 2019-05-31 MED ORDER — SODIUM CHLORIDE 0.9% FLUSH: 3.0000 mL | Freq: Once | INTRAVENOUS | Status: DC

## 2019-05-31 MED ORDER — FAMOTIDINE 20 MG PO TABS
20.0000 mg | ORAL_TABLET | Freq: Two times a day (BID) | ORAL | Status: DC
  Administered 2019-05-31 – 2019-06-02 (×4): 20 mg via ORAL
  Filled 2019-05-31 (×4): qty 1

## 2019-05-31 MED ORDER — SODIUM CHLORIDE 0.9 % WEIGHT BASED INFUSION
1.0000 mL/kg/h | INTRAVENOUS | Status: DC
  Administered 2019-06-01: 500 mL via INTRAVENOUS

## 2019-05-31 MED ORDER — LATANOPROST 0.005 % OP SOLN
1.0000 [drp] | Freq: Every day | OPHTHALMIC | Status: DC
  Filled 2019-05-31: qty 2.5

## 2019-05-31 MED ORDER — SODIUM CHLORIDE 0.9% FLUSH: 3.0000 mL | INTRAVENOUS | Status: DC | PRN

## 2019-05-31 MED ORDER — EZETIMIBE 10 MG PO TABS
10.0000 mg | ORAL_TABLET | Freq: Every day | ORAL | Status: DC
  Administered 2019-06-01 – 2019-06-02 (×2): 10 mg via ORAL
  Filled 2019-05-31 (×2): qty 1

## 2019-05-31 MED ORDER — NITROGLYCERIN IN D5W 200-5 MCG/ML-% IV SOLN
0.0000 ug/min | INTRAVENOUS | Status: DC
  Administered 2019-05-31: 18:00:00 5 ug/min via INTRAVENOUS

## 2019-05-31 MED ORDER — METOPROLOL TARTRATE 25 MG PO TABS
25.0000 mg | ORAL_TABLET | Freq: Two times a day (BID) | ORAL | Status: DC
  Administered 2019-05-31 – 2019-06-02 (×4): 25 mg via ORAL
  Filled 2019-05-31 (×4): qty 1

## 2019-05-31 MED ORDER — DORZOLAMIDE HCL-TIMOLOL MAL 2-0.5 % OP SOLN
1.0000 [drp] | Freq: Two times a day (BID) | OPHTHALMIC | Status: DC
  Administered 2019-06-01: 1 [drp] via OPHTHALMIC
  Filled 2019-05-31: qty 10

## 2019-05-31 MED ORDER — NITROGLYCERIN IN D5W 200-5 MCG/ML-% IV SOLN
INTRAVENOUS | Status: AC
  Administered 2019-05-31: 5 ug/min via INTRAVENOUS
  Filled 2019-05-31: qty 250

## 2019-05-31 MED ORDER — LEVOTHYROXINE SODIUM 50 MCG PO TABS
50.0000 ug | ORAL_TABLET | Freq: Every day | ORAL | Status: DC
  Administered 2019-06-01 – 2019-06-02 (×2): 50 ug via ORAL
  Filled 2019-05-31 (×2): qty 1

## 2019-05-31 MED ORDER — SODIUM CHLORIDE 0.9 % IV SOLN: 250.0000 mL | INTRAVENOUS | Status: DC | PRN

## 2019-05-31 MED ORDER — HEPARIN BOLUS VIA INFUSION
4000.0000 [IU] | Freq: Once | INTRAVENOUS | Status: AC
  Administered 2019-05-31: 4000 [IU] via INTRAVENOUS
  Filled 2019-05-31: qty 4000

## 2019-05-31 NOTE — ED Triage Notes (Signed)
Patient complains of CP/ pressure since this am. On arrival denies pain. No associated symptoms. Alert and oriented

## 2019-05-31 NOTE — ED Provider Notes (Signed)
[facility] EMERGENCY DEPARTMENT Provider Note   CSN: [id number] Arrival date & time: 05/31/19  [id number]     History   Chief Complaint Chief Complaint  Patient presents with   Chest Pain    HPI [name] is a 78 y.o. female.     78 year old female presents with chest pressure that began today when she woke up pain went through to her back and did not radiate to her arms or to her neck.  No associated dyspnea or diaphoresis.  No nausea or vomiting.  Pain lasted for about 25 to 30 minutes and resolved on his own.  History of similar symptoms which she states she was evaluated for but no etiology.  No recent fever cough or congestion.  No leg pain or swelling     Past Medical History:  Diagnosis Date   Abnormal thyroid function test 07/01/2013   Anxiety    Arthritis    Cancer ([place])    Dermatitis 11/25/2013   Glaucoma 11/02/2012   HTN (hypertension) 11/02/2012   hx: breast cancer, IDC (Microinvasive) w DCIS, 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 - 07/10/2011   Hyperlipidemia    Hypothyroid    Obesity     Patient Active Problem List   Diagnosis Date Noted   Chest pain 12/29/2018   History of adenomatous polyp of colon 09/30/2018   Primary open-angle glaucoma 11/20/2017   Pseudophakia of both eyes 11/20/2017   Grieving 02/18/2017   Osteoporosis 11/05/2016   Medicare annual wellness visit, subsequent 09/26/2014   Hypothyroidism 07/01/2013   Essential hypertension 11/02/2012   Vitamin D deficiency 11/02/2012   Hyperlipidemia, mixed 07/24/2011   Obesity (BMI 30-39.9) 07/24/2011   hx: breast cancer, IDC (Microinvasive) w DCIS, 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 - 07/10/2011    Past Surgical History:  Procedure Laterality Date   AUGMENTATION MAMMAPLASTY Left    05/2006   BREAST SURGERY  10/2005   Left Mastectomy, reconstruction, reduction on right. Dr. Harlow Mares   COLONOSCOPY     CRYOTHERAPY     GYN for CIN III   DILATION AND CURETTAGE OF UTERUS      EXCISION MORTON'S NEUROMA     right foot   EYE SURGERY Right 05/08/2018   REDUCTION MAMMAPLASTY Right      OB History   No obstetric history on file.      Home Medications    Prior to Admission medications   Medication Sig Start Date End Date Taking? Authorizing Provider  AMBULATORY NON FORMULARY MEDICATION Take 180 mg by mouth daily. Medication Name: 180 mg bempedoic acid vs a placebo, CLEAR Research Study drug provided 11/01/16   Lelon Perla, MD  Bimatoprost (LUMIGAN OP) Place 1 drop into both eyes at bedtime.     [provider]  chlorthalidone (HYGROTON) 25 MG tablet TAKE (1/2) TABLET DAILY. 01/06/19   Marin Olp, MD  Cholecalciferol (VITAMIN D-3) 5000 UNITS TABS Take 5,000 mg by mouth daily.    [provider]  dorzolamide-timolol (COSOPT) 22.3-6.8 MG/ML ophthalmic solution Place 1 drop into the left eye 2 (two) times daily.     [provider]  ezetimibe (ZETIA) 10 MG tablet Take 1 tablet (10 mg total) by mouth daily. 01/06/19   Marin Olp, MD  famotidine (PEPCID) 20 MG tablet Take 1 tablet (20 mg total) by mouth 2 (two) times daily. 04/29/19   Marin Olp, MD  Krill Oil 1000 MG CAPS Take 1,000 mg by mouth daily. Mega Red  [provider]  levothyroxine (SYNTHROID) 50 MCG tablet Take 1 tablet (50 mcg total) by mouth daily before breakfast. 01/06/19   Marin Olp, MD  metoprolol tartrate (LOPRESSOR) 25 MG tablet Take 1 tablet (25 mg total) by mouth 2 (two) times daily for 30 days. 01/06/19 02/05/19  Marin Olp, MD  Omega-3 Fatty Acids (OMEGA III EPA+DHA PO) Take 1 capsule by mouth daily.     [provider]  pilocarpine (PILOCAR) 1 % ophthalmic solution Place 1 drop into the left eye 2 (two) times daily.    [provider]  Polyethyl Glycol-Propyl Glycol (SYSTANE OP) Place 1-2 drops into both eyes as needed (for dry eyes).     [provider]  Probiotic Product (PROBIOTIC DAILY) CAPS  Take 1 capsule by mouth daily. Reported on 10/10/2015    [provider]    Family History Family History  Problem Relation Age of Onset   Pneumonia Mother    Hyperlipidemia Mother    Parkinsonism Mother    CAD Mother    Other Father        bilateral subdural hematoma   Colon cancer Paternal Grandmother    Hyperlipidemia Sister    Prostate cancer Neg Hx    Breast cancer Neg Hx    Heart disease Neg Hx    Diabetes Neg Hx     Social History Social History   Tobacco Use   Smoking status: Never Smoker   Smokeless tobacco: Never Used  Substance Use Topics   Alcohol use: Yes    Comment: Rare   Drug use: No     Allergies   Adhesive [tape] and Rosuvastatin   Review of Systems Review of Systems  All other systems reviewed and are negative.    Physical Exam Updated Vital Signs BP (!) 177/57    Pulse 70    Temp 97.9 F (36.6 C)    Resp 16    SpO2 100%   Physical Exam Vitals signs and nursing note reviewed.  Constitutional:      General: She is not in acute distress.    Appearance: Normal appearance. She is well-developed. She is not toxic-appearing.  HENT:     Head: Normocephalic and atraumatic.  Eyes:     General: Lids are normal.     Conjunctiva/sclera: Conjunctivae normal.     Pupils: Pupils are equal, round, and reactive to light.  Neck:     Musculoskeletal: Normal range of motion and neck supple.     Thyroid: No thyroid mass.     Trachea: No tracheal deviation.  Cardiovascular:     Rate and Rhythm: Normal rate and regular rhythm.     Heart sounds: Normal heart sounds. No murmur. No gallop.   Pulmonary:     Effort: Pulmonary effort is normal. No respiratory distress.     Breath sounds: Normal breath sounds. No stridor. No decreased breath sounds, wheezing, rhonchi or rales.  Abdominal:     General: Bowel sounds are normal. There is no distension.     Palpations: Abdomen is soft.     Tenderness: There is no abdominal tenderness.  There is no rebound.  Musculoskeletal: Normal range of motion.        General: No tenderness.  Skin:    General: Skin is warm and dry.     Findings: No abrasion or rash.  Neurological:     Mental Status: She is alert and oriented to person, place, and time.  GCS: GCS eye subscore is 4. GCS verbal subscore is 5. GCS motor subscore is 6.     Cranial Nerves: No cranial nerve deficit.     Sensory: No sensory deficit.  Psychiatric:        Speech: Speech normal.        Behavior: Behavior normal.      ED Treatments / Results  Labs (all labs ordered are listed, but only abnormal results are displayed) Labs Reviewed  BASIC METABOLIC PANEL - Abnormal; Notable for the following components:      Result Value   Glucose, Bld 111 (*)    All other components within normal limits  CBC - Abnormal; Notable for the following components:   WBC 11.4 (*)    All other components within normal limits  TROPONIN I (HIGH SENSITIVITY) - Abnormal; Notable for the following components:   Troponin I (High Sensitivity) 348 (*)    All other components within normal limits  SARS CORONAVIRUS 2 (HOSPITAL ORDER, [place] [facility])  TROPONIN I (HIGH SENSITIVITY)    EKG EKG Interpretation  Date/Time:  Sunday May 31 2019 10:03:53 EDT Ventricular Rate:  60 PR Interval:  164 QRS Duration: 84 QT Interval:  404 QTC Calculation: 404 R Axis:   12 Text Interpretation:  Normal sinus rhythm Possible Anterior infarct , age undetermined Abnormal ECG No significant change since last tracing Confirmed by Lacretia Leigh ([id number]) on 05/31/2019 11:15:23 AM   Radiology Dg Chest 2 View  Result Date: 05/31/2019 CLINICAL DATA:  Chest pressure EXAM: CHEST - 2 VIEW COMPARISON:  12/29/2018 FINDINGS: Lungs are clear.  No pleural effusion or pneumothorax. The heart is normal in size. Degenerative changes of the visualized thoracolumbar spine. IMPRESSION: Normal chest radiographs. Electronically Signed    By: Julian Hy M.D.   On: 05/31/2019 11:36    Procedures Procedures (including critical care time)  Medications Ordered in ED Medications  sodium chloride flush (NS) 0.9 % injection 3 mL (3 mLs Intravenous Not Given 05/31/19 1129)     Initial Impression / Assessment and Plan / ED Course  I have reviewed the triage vital signs and the nursing notes.  Pertinent labs & imaging results that were available during my care of the patient were reviewed by me and considered in my medical decision making (see chart for details).        Patient's initial EKG without acute ischemic changes.  Troponin elevation noted.  Repeat EKG continues to show no changes.  Will heparinize per pharmacy.  Patient is already had aspirin today.  Will obtain cardiology consultation and admit to the hospital  CRITICAL CARE Performed by: Leota Jacobsen Total critical care time: 45 minutes Critical care time was exclusive of separately billable procedures and treating other patients. Critical care was necessary to treat or prevent imminent or life-threatening deterioration. Critical care was time spent personally by me on the following activities: development of treatment plan with patient and/or surrogate as well as nursing, discussions with consultants, evaluation of patient's response to treatment, examination of patient, obtaining history from patient or surrogate, ordering and performing treatments and interventions, ordering and review of laboratory studies, ordering and review of radiographic studies, pulse oximetry and re-evaluation of patient's condition.   Final Clinical Impressions(s) / ED Diagnoses   Final diagnoses:  None    ED Discharge Orders    None       Lacretia Leigh, MD 05/31/19 1151

## 2019-05-31 NOTE — Progress Notes (Signed)
Patient arrived on floor and stated that she is not having the pain she had when she came in but she hasn't stopped having the feeling of tightness in her chest like she is wearing a bra that is too tight. I called [place], [place] and orders received for IV nitro. EKG done and once I started her on the nitro, she stated that feeling of tightness had gone away. Daughter at bedside.

## 2019-05-31 NOTE — Progress Notes (Addendum)
ANTICOAGULATION CONSULT NOTE - Initial Consult  Pharmacy Consult for heparin Indication: chest pain/ACS  Allergies  Allergen Reactions  . Adhesive [Tape] Other (See Comments)    Causes skin to turn red where touched  . Rosuvastatin Other (See Comments)    Joint & muscle aches, sleep issues.    Patient Measurements: Height: 5' 2.5" (158.8 cm) Weight: 161 lb 3.2 oz (73.1 kg) IBW/kg (Calculated) : 51.25 Heparin Dosing Weight: 66.8kg  Vital Signs: Temp: 97.9 F (36.6 C) (09/20 0959) BP: 177/57 (09/20 1123) Pulse Rate: 70 (09/20 1123)  Labs: Recent Labs    05/31/19 1020  HGB 14.4  HCT 43.6  PLT 301  CREATININE 0.89  TROPONINIHS 348*    Estimated Creatinine Clearance: 50.1 mL/min (by C-G formula based on SCr of 0.89 mg/dL).   Medical History: Past Medical History:  Diagnosis Date  . Abnormal thyroid function test 07/01/2013  . Anxiety   . Arthritis   . Cancer ([place])   . Dermatitis 11/25/2013  . Glaucoma 11/02/2012  . HTN (hypertension) 11/02/2012  . 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 Hyperlipidemia   . Hypothyroid   . Obesity     Medications:  Infusions:  . heparin      Assessment: 62 yof presented to the ED with CP. Troponin mildly elevated and now starting IV heparin. Baseline CBC is WNL and she is not on anticoagulation PTA.   Goal of Therapy:  Heparin level 0.3-0.7 units/ml Monitor platelets by anticoagulation protocol: Yes   Plan:  Heparin bolus 4000 units IV x 1 Heparin gtt 800 units/hr Check an 8 hr heparin level Daily heparin level and CBC  [name], Rande Lawman 05/31/2019,11:56 AM  Addendum: Initial heparin level is therapeutic. Continue current dose and follow-up AM heparin level to confirm dosing.  Salome Arnt, PharmD, BCPS Please see AMION for all pharmacy numbers 05/31/2019 8:33 PM

## 2019-05-31 NOTE — H&P (Signed)
Cardiology Admission History and Physical:   Patient ID: [name] MRN: [id number]; DOB: Jan 24, 1941   Admission date: 05/31/2019  Primary Care Provider: Marin Olp, MD Primary Cardiologist: Dorris Carnes, MD  Primary Electrophysiologist:  None   Chief Complaint:  Chest pain  Patient Profile:   [name] is a 78 y.o. female with CAD by CT scan, dyslipidemia, and HTN admitted with NSTEMI.  History of Present Illness:   [name] is followed by Dr. Harrington Challenger and has a h/o HTN, dyslipidemia and coronary artery disease by CT scanning. She has not had any symptoms until today when she awoke experiencing SSCP which resolved with ntg. The patient feels well. Her initial ECG in the ED demonstrates NSR with no acute STT changes. Her initial troponin was 348 then 616. Her chest pressure has resolved.  Heart Pathway Score:     Past Medical History:  Diagnosis Date  . Abnormal thyroid function test 07/01/2013  . Anxiety   . Arthritis   . Cancer ([place])   . Dermatitis 11/25/2013  . Glaucoma 11/02/2012  . HTN (hypertension) 11/02/2012  . 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 Hyperlipidemia   . Hypothyroid   . Obesity     Past Surgical History:  Procedure Laterality Date  . AUGMENTATION MAMMAPLASTY Left    05/2006  . BREAST SURGERY  10/2005   Left Mastectomy, reconstruction, reduction on right. Dr. Harlow Mares  . COLONOSCOPY    . CRYOTHERAPY     GYN for CIN III  . DILATION AND CURETTAGE OF UTERUS    . EXCISION MORTON'S NEUROMA     right foot  . EYE SURGERY Right 05/08/2018  . REDUCTION MAMMAPLASTY Right      Medications Prior to Admission: Prior to Admission medications   Medication Sig Start Date End Date Taking? Authorizing Provider  AMBULATORY NON FORMULARY MEDICATION Take 180 mg by mouth daily. Medication Name: 180 mg bempedoic acid vs a placebo, CLEAR Research Study drug provided 11/01/16  Yes Lelon Perla, MD  aspirin EC  81 MG tablet Take 81 mg by mouth daily.   Yes [provider]  Bimatoprost (LUMIGAN OP) Place 1 drop into both eyes at bedtime.    Yes [provider]  chlorthalidone (HYGROTON) 25 MG tablet TAKE (1/2) TABLET DAILY. Patient taking differently: Take 12.5 mg by mouth daily.  01/06/19  Yes Marin Olp, MD  Cholecalciferol (VITAMIN D-3) 5000 UNITS TABS Take 5,000 mg by mouth daily.   Yes [provider]  dorzolamide-timolol (COSOPT) 22.3-6.8 MG/ML ophthalmic solution Place 1 drop into the left eye 2 (two) times daily.    Yes [provider]  ezetimibe (ZETIA) 10 MG tablet Take 1 tablet (10 mg total) by mouth daily. 01/06/19  Yes Marin Olp, MD  famotidine (PEPCID) 20 MG tablet Take 1 tablet (20 mg total) by mouth 2 (two) times daily. 04/29/19  Yes Marin Olp, MD  Krill Oil 1000 MG CAPS Take 1,000 mg by mouth daily. Mega Red    Yes [provider]  levothyroxine (SYNTHROID) 50 MCG tablet Take 1 tablet (50 mcg total) by mouth daily before breakfast. 01/06/19  Yes Marin Olp, MD  metoprolol tartrate (LOPRESSOR) 25 MG tablet Take 1 tablet (25 mg total) by mouth 2 (two) times daily for 30 days. 01/06/19 05/31/19 Yes Marin Olp, MD  Omega-3 Fatty Acids (OMEGA III EPA+DHA PO) Take 1 capsule by mouth daily.  Yes [provider]  pilocarpine (PILOCAR) 1 % ophthalmic solution Place 1 drop into the left eye 2 (two) times daily.   Yes [provider]  Polyethyl Glycol-Propyl Glycol (SYSTANE OP) Place 1-2 drops into both eyes as needed (for dry eyes).    Yes [provider]  Probiotic Product (PROBIOTIC DAILY) CAPS Take 1 capsule by mouth daily. Reported on 10/10/2015   Yes [provider]     Allergies:    Allergies  Allergen Reactions  . Adhesive [Tape] Other (See Comments)    Causes skin to turn red where touched  . Rosuvastatin Other (See Comments)    Joint & muscle aches, sleep issues.     Social History:   Social History   Socioeconomic History  . Marital status: Widowed    Spouse name: Not on file  . Number of children: 1  . Years of education: Not on file  . Highest education level: Not on file  Occupational History  . Not on file  Social Needs  . Financial resource strain: Not on file  . Food insecurity    Worry: Not on file    Inability: Not on file  . Transportation needs    Medical: Not on file    Non-medical: Not on file  Tobacco Use  . Smoking status: Never Smoker  . Smokeless tobacco: Never Used  Substance and Sexual Activity  . Alcohol use: Yes    Comment: Rare  . Drug use: No  . Sexual activity: Not on file  Lifestyle  . Physical activity    Days per week: Not on file    Minutes per session: Not on file  . Stress: Not on file  Relationships  . Social Herbalist on phone: Not on file    Gets together: Not on file    Attends religious service: Not on file    Active member of club or organization: Not on file    Attends meetings of clubs or organizations: Not on file    Relationship status: Not on file  . Intimate partner violence    Fear of current or ex partner: No    Emotionally abused: No    Physically abused: No    Forced sexual activity: No  Other Topics Concern  . Not on file  Social History Narrative   Married 1981. 1 daughter, 2 step sons. 4 grandchildren      Oncologist      Hobbies: going out to eat, activities with church including cancer support group at church    Family History:   The patient's family history includes CAD in her mother; Colon cancer in her paternal grandmother; Hyperlipidemia in her mother and sister; Other in her father; Parkinsonism in her mother; Pneumonia in her mother. There is no history of Prostate cancer, Breast cancer, Heart disease, or Diabetes.    ROS:  Please see the history of present illness.  All other ROS reviewed and negative.     Physical Exam/Data:   Vitals:    05/31/19 0959 05/31/19 1123 05/31/19 1130  BP: (!) 187/75 (!) 177/57   Pulse: 79 70   Resp: 16 16   Temp: 97.9 F (36.6 C)    SpO2: 99% 100%   Weight:   73.1 kg  Height:   5' 2.5" (1.588 m)   No intake or output data in the 24 hours ending 05/31/19 1332 Last 3 Weights 05/31/2019 04/29/2019 04/29/2019  Weight (lbs) 161 lb  3.2 oz 161 lb 3.2 oz 161 lb 3.2 oz  Weight (kg) 73.12 kg 73.12 kg 73.12 kg     Body mass index is 29.01 kg/m.  General:  Well nourished, well developed, in no acute distress HEENT: normal Lymph: no adenopathy Neck: no JVD Endocrine:  No thryomegaly Vascular: No carotid bruits; FA pulses 2+ bilaterally without bruits  Cardiac:  normal S1, S2; RRR; no murmur  Lungs:  clear to auscultation bilaterally, no wheezing, rhonchi or rales  Abd: soft, nontender, no hepatomegaly  Ext: no edema Musculoskeletal:  No deformities, BUE and BLE strength normal and equal Skin: warm and dry  Neuro:  CNs 2-12 intact, no focal abnormalities noted Psych:  Normal affect    EKG:  The ECG that was done  was personally reviewed and demonstrates nsr  Relevant CV Studies: none  Laboratory Data:  High Sensitivity Troponin:   Recent Labs  Lab 05/31/19 1020 05/31/19 1200  TROPONINIHS 348* 616*      Chemistry Recent Labs  Lab 05/31/19 1020  NA 141  K 3.5  CL 104  CO2 27  GLUCOSE 111*  BUN 21  CREATININE 0.89  CALCIUM 9.6  GFRNONAA >60  GFRAA >60  ANIONGAP 10    No results for input(s): PROT, ALBUMIN, AST, ALT, ALKPHOS, BILITOT in the last 168 hours. Hematology Recent Labs  Lab 05/31/19 1020  WBC 11.4*  RBC 4.70  HGB 14.4  HCT 43.6  MCV 92.8  MCH 30.6  MCHC 33.0  RDW 11.9  PLT 301   BNPNo results for input(s): BNP, PROBNP in the last 168 hours.  DDimer No results for input(s): DDIMER in the last 168 hours.   Radiology/Studies:  Dg Chest 2 View  Result Date: 05/31/2019 CLINICAL DATA:  Chest pressure EXAM: CHEST - 2 VIEW COMPARISON:  12/29/2018 FINDINGS:  Lungs are clear.  No pleural effusion or pneumothorax. The heart is normal in size. Degenerative changes of the visualized thoracolumbar spine. IMPRESSION: Normal chest radiographs. Electronically Signed   By: Julian Hy M.D.   On: 05/31/2019 11:36    Assessment and Plan:   1. NSTEMI - her ECG is unremarkable but troponins elevated and she has known disease and multiple cardiac risk factors. She will be started on IV heparin. I suspect left heart cath is warranted. Her lack of ECG findings would suggest that there is not a large burden of myocardium at risk. ASA, statin and continue home meds. 2. Dyslipidemia - she will continue her zetia therapy. Sounds like she does not tolerate statins.   Severity of Illness: The appropriate patient status for this patient is OBSERVATION. Observation status is judged to be reasonable and necessary in order to provide the required intensity of service to ensure the patient's safety. The patient's presenting symptoms, physical exam findings, and initial radiographic and laboratory data in the context of their medical condition is felt to place them at decreased risk for further clinical deterioration. Furthermore, it is anticipated that the patient will be medically stable for discharge from the hospital within 2 midnights of admission. The following factors support the patient status of observation.   " The patient's presenting symptoms include chest pain. " The physical exam findings include nothing objective. " The initial radiographic and laboratory data are elevated troponin.     For questions or updates, please contact [place] Please consult [email or web address] for contact info under   Signed, Cristopher Peru, MD  05/31/2019 1:32 PM

## 2019-05-31 NOTE — ED Notes (Signed)
Cardiology at bedside.

## 2019-05-31 NOTE — ED Notes (Signed)
ED Provider at bedside. 

## 2019-05-31 NOTE — ED Notes (Signed)
ED TO INPATIENT HANDOFF REPORT  ED Nurse Name and Phone #: Joellen Jersey [phone]  S Name/Age/Gender [name] 78 y.o. female Room/Bed: 044C/044C  Code Status   Code Status: Prior  Home/SNF/Other Home Patient oriented to: self, place, time and situation Is this baseline? Yes   Triage Complete: Triage complete  Chief Complaint High BP  Triage Note Patient complains of CP/ pressure since this am. On arrival denies pain. No associated symptoms. Alert and oriented   Allergies Allergies  Allergen Reactions  . Adhesive [Tape] Other (See Comments)    Causes skin to turn red where touched  . Rosuvastatin Other (See Comments)    Joint & muscle aches, sleep issues.    Level of Care/Admitting Diagnosis ED Disposition    ED Disposition Condition Comment   Admit  Hospital Area: [facility] [[id number]]  Level of Care: Telemetry Cardiac [[id number]]  Covid Evaluation: Confirmed COVID Negative  Diagnosis: NSTEMI (non-ST elevated myocardial infarction) [id number]  Admitting Physician: Evans Lance [id number]  Attending Physician: Evans Lance [[id number]]  PT Class (Do Not Modify): Observation [104]  PT Acc Code (Do Not Modify): Observation [10022]       B Medical/Surgery History Past Medical History:  Diagnosis Date  . Abnormal thyroid function test 07/01/2013  . Anxiety   . Arthritis   . Cancer ([place])   . Dermatitis 11/25/2013  . Glaucoma 11/02/2012  . HTN (hypertension) 11/02/2012  . 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 Hyperlipidemia   . Hypothyroid   . Obesity    Past Surgical History:  Procedure Laterality Date  . AUGMENTATION MAMMAPLASTY Left    05/2006  . BREAST SURGERY  10/2005   Left Mastectomy, reconstruction, reduction on right. Dr. Harlow Mares  . COLONOSCOPY    . CRYOTHERAPY     GYN for CIN III  . DILATION AND CURETTAGE OF UTERUS    . EXCISION MORTON'S NEUROMA     right foot  . EYE SURGERY Right  05/08/2018  . REDUCTION MAMMAPLASTY Right      A IV Location/Drains/Wounds Patient Lines/Drains/Airways Status   Active Line/Drains/Airways    Name:   Placement date:   Placement time:   Site:   Days:   Peripheral IV 05/31/19 Right Antecubital   05/31/19    1158    Antecubital   less than 1          Intake/Output Last 24 hours No intake or output data in the 24 hours ending 05/31/19 1439  Labs/Imaging Results for orders placed or performed during the hospital encounter of 05/31/19 (from the past 48 hour(s))  Basic metabolic panel     Status: Abnormal   Collection Time: 05/31/19 10:20 AM  Result Value Ref Range   Sodium 141 135 - 145 mmol/L   Potassium 3.5 3.5 - 5.1 mmol/L   Chloride 104 98 - 111 mmol/L   CO2 27 22 - 32 mmol/L   Glucose, Bld 111 (H) 70 - 99 mg/dL   BUN 21 8 - 23 mg/dL   Creatinine, Ser 0.89 0.44 - 1.00 mg/dL   Calcium 9.6 8.9 - 10.3 mg/dL   GFR calc non Af Amer >60 >60 mL/min   GFR calc Af Amer >60 >60 mL/min   Anion gap 10 5 - 15    Comment: Performed at [facility], [place] [address] [place], [place] [address]  CBC     Status: Abnormal   Collection  Time: 05/31/19 10:20 AM  Result Value Ref Range   WBC 11.4 (H) 4.0 - 10.5 K/uL   RBC 4.70 3.87 - 5.11 MIL/uL   Hemoglobin 14.4 12.0 - 15.0 g/dL   HCT 43.6 36.0 - 46.0 %   MCV 92.8 80.0 - 100.0 fL   MCH 30.6 26.0 - 34.0 pg   MCHC 33.0 30.0 - 36.0 g/dL   RDW 11.9 11.5 - 15.5 %   Platelets 301 150 - 400 K/uL   nRBC 0.0 0.0 - 0.2 %    Comment: Performed at [facility], [place] [address] [place], [place] [address]  Troponin I (High Sensitivity)     Status: Abnormal   Collection Time: 05/31/19 10:20 AM  Result Value Ref Range   Troponin I (High Sensitivity) 348 (HH) <18 ng/L    Comment: CRITICAL RESULT CALLED TO, READ BACK BY AND VERIFIED WITH: C.RAND,RN @ [id number] 05/31/2019 WEBBERJ (NOTE) Elevated high sensitivity troponin I (hsTnI) values and significant  changes across serial measurements may  suggest ACS but many other  chronic and acute conditions are known to elevate hsTnI results.  Refer to the Links section for chest pain algorithms and additional  guidance. Performed at [facility], [place] [address] [place], [place] [address]   SARS Coronavirus 2 [facility] order, Performed in [facility] hospital lab) Nasopharyngeal Nasopharyngeal Swab     Status: None   Collection Time: 05/31/19 12:00 PM   Specimen: Nasopharyngeal Swab  Result Value Ref Range   SARS Coronavirus 2 NEGATIVE NEGATIVE    Comment: (NOTE) If result is NEGATIVE SARS-CoV-2 target nucleic acids are NOT DETECTED. The SARS-CoV-2 RNA is generally detectable in upper and lower  respiratory specimens during the acute phase of infection. The lowest  concentration of SARS-CoV-2 viral copies this assay can detect is 250  copies / mL. A negative result does not preclude SARS-CoV-2 infection  and should not be used as the sole basis for treatment or other  patient management decisions.  A negative result may occur with  improper specimen collection / handling, submission of specimen other  than nasopharyngeal swab, presence of viral mutation(s) within the  areas targeted by this assay, and inadequate number of viral copies  (<250 copies / mL). A negative result must be combined with clinical  observations, patient history, and epidemiological information. If result is POSITIVE SARS-CoV-2 target nucleic acids are DETECTED. The SARS-CoV-2 RNA is generally detectable in upper and lower  respiratory specimens dur ing the acute phase of infection.  Positive  results are indicative of active infection with SARS-CoV-2.  Clinical  correlation with patient history and other diagnostic information is  necessary to determine patient infection status.  Positive results do  not rule out bacterial infection or co-infection with other viruses. If result is PRESUMPTIVE POSTIVE SARS-CoV-2 nucleic acids MAY BE PRESENT.   A  presumptive positive result was obtained on the submitted specimen  and confirmed on repeat testing.  While 2019 novel coronavirus  (SARS-CoV-2) nucleic acids may be present in the submitted sample  additional confirmatory testing may be necessary for epidemiological  and / or clinical management purposes  to differentiate between  SARS-CoV-2 and other Sarbecovirus currently known to infect humans.  If clinically indicated additional testing with an alternate test  methodology [phone]) is advised. The SARS-CoV-2 RNA is generally  detectable in upper and lower respiratory sp ecimens during the acute  phase of infection. The expected result is Negative. Fact Sheet for Patients:  StrictlyIdeas.no Fact Sheet for Healthcare Providers: BankingDealers.co.za This test is not yet approved or cleared by the [place] FDA and has been authorized for detection and/or diagnosis of SARS-CoV-2 by FDA under an Emergency Use Authorization (EUA).  This EUA will remain in effect (meaning this test can be used) for the duration of the COVID-19 declaration under Section 564(b)(1) of the Act, 21 U.S.C. section 360bbb-3(b)(1), unless the authorization is terminated or revoked sooner. Performed at [facility], [place] [address], [place], [place] [address]   Troponin I (High Sensitivity)     Status: Abnormal   Collection Time: 05/31/19 12:00 PM  Result Value Ref Range   Troponin I (High Sensitivity) 616 (HH) <18 ng/L    Comment: CRITICAL VALUE NOTED.  VALUE IS CONSISTENT WITH PREVIOUSLY REPORTED AND CALLED VALUE. (NOTE) Elevated high sensitivity troponin I (hsTnI) values and significant  changes across serial measurements may suggest ACS but many other  chronic and acute conditions are known to elevate hsTnI results.  Refer to the Links section for chest pain algorithms and additional  guidance. Performed at [facility], [place] [address],  [place], [place] [address]    Dg Chest 2 View  Result Date: 05/31/2019 CLINICAL DATA:  Chest pressure EXAM: CHEST - 2 VIEW COMPARISON:  12/29/2018 FINDINGS: Lungs are clear.  No pleural effusion or pneumothorax. The heart is normal in size. Degenerative changes of the visualized thoracolumbar spine. IMPRESSION: Normal chest radiographs. Electronically Signed   By: Julian Hy M.D.   On: 05/31/2019 11:36    Pending Labs Unresulted Labs (From admission, onward)    Start     Ordered   06/01/19 0500  Heparin level (unfractionated)  Daily,   R     05/31/19 1155   06/01/19 0500  CBC  Daily,   R     05/31/19 1155   05/31/19 2030  Heparin level (unfractionated)  Once-Timed,   STAT     05/31/19 1155   Signed and Held  Basic metabolic panel  Tomorrow morning,   R     Signed and Held   Signed and Held  Lipid panel  Tomorrow morning,   R     Signed and Held          Vitals/Pain Today's Vitals   05/31/19 0959 05/31/19 1002 05/31/19 1123 05/31/19 1130  BP: (!) 187/75  (!) 177/57   Pulse: 79  70   Resp: 16  16   Temp: 97.9 F (36.6 C)     SpO2: 99%  100%   Weight:    73.1 kg  Height:    5' 2.5" (1.588 m)  PainSc:  0-No pain  0-No pain    Isolation Precautions Airborne and Contact precautions  Medications Medications  sodium chloride flush (NS) 0.9 % injection 3 mL (3 mLs Intravenous Not Given 05/31/19 1129)  heparin ADULT infusion 100 units/mL (25000 units/253mL sodium chloride 0.45%) (800 Units/hr Intravenous New Bag/Given 05/31/19 1210)  heparin bolus via infusion 4,000 Units (4,000 Units Intravenous Bolus from Bag 05/31/19 1210)    Mobility walks Low fall risk   Focused Assessments Cardiac Assessment Handoff:  Cardiac Rhythm: Normal sinus rhythm Lab Results  Component Value Date   TROPONINI <0.03 12/29/2018   Lab Results  Component Value Date   DDIMER 0.31 12/29/2018   Does the Patient currently have chest pain? No     R Recommendations: See Admitting Provider  Note  Report given to:   Additional Notes:

## 2019-06-01 ENCOUNTER — Encounter (HOSPITAL_COMMUNITY): Payer: Self-pay | Admitting: Cardiovascular Disease

## 2019-06-01 ENCOUNTER — Encounter (HOSPITAL_COMMUNITY)
Admission: EM | Disposition: A | Discharge status: Home / Self Care | Payer: Self-pay | Source: Home / Self Care | Attending: Internal Medicine

## 2019-06-01 DIAGNOSIS — Z800 Family history of malignant neoplasm of digestive organs: Secondary | ICD-10-CM | POA: Diagnosis not present

## 2019-06-01 DIAGNOSIS — E782 Mixed hyperlipidemia: Secondary | ICD-10-CM

## 2019-06-01 DIAGNOSIS — Z853 Personal history of malignant neoplasm of breast: Secondary | ICD-10-CM | POA: Diagnosis not present

## 2019-06-01 DIAGNOSIS — I2510 Atherosclerotic heart disease of native coronary artery without angina pectoris: Secondary | ICD-10-CM | POA: Diagnosis present

## 2019-06-01 DIAGNOSIS — Z8349 Family history of other endocrine, nutritional and metabolic diseases: Secondary | ICD-10-CM | POA: Diagnosis not present

## 2019-06-01 DIAGNOSIS — Z7982 Long term (current) use of aspirin: Secondary | ICD-10-CM | POA: Diagnosis not present

## 2019-06-01 DIAGNOSIS — Z820 Family history of epilepsy and other diseases of the nervous system: Secondary | ICD-10-CM | POA: Diagnosis not present

## 2019-06-01 DIAGNOSIS — Z9012 Acquired absence of left breast and nipple: Secondary | ICD-10-CM | POA: Diagnosis not present

## 2019-06-01 DIAGNOSIS — E039 Hypothyroidism, unspecified: Secondary | ICD-10-CM | POA: Diagnosis present

## 2019-06-01 DIAGNOSIS — H409 Unspecified glaucoma: Secondary | ICD-10-CM | POA: Diagnosis present

## 2019-06-01 DIAGNOSIS — E876 Hypokalemia: Secondary | ICD-10-CM | POA: Diagnosis present

## 2019-06-01 DIAGNOSIS — Z8249 Family history of ischemic heart disease and other diseases of the circulatory system: Secondary | ICD-10-CM | POA: Diagnosis not present

## 2019-06-01 DIAGNOSIS — Z79890 Hormone replacement therapy (postmenopausal): Secondary | ICD-10-CM | POA: Diagnosis not present

## 2019-06-01 DIAGNOSIS — I214 Non-ST elevation (NSTEMI) myocardial infarction: Secondary | ICD-10-CM | POA: Diagnosis present

## 2019-06-01 DIAGNOSIS — I10 Essential (primary) hypertension: Secondary | ICD-10-CM

## 2019-06-01 DIAGNOSIS — Z20828 Contact with and (suspected) exposure to other viral communicable diseases: Secondary | ICD-10-CM | POA: Diagnosis present

## 2019-06-01 DIAGNOSIS — E785 Hyperlipidemia, unspecified: Secondary | ICD-10-CM | POA: Diagnosis present

## 2019-06-01 HISTORY — PX: CORONARY STENT INTERVENTION: CATH118234

## 2019-06-01 HISTORY — PX: LEFT HEART CATH AND CORONARY ANGIOGRAPHY: CATH118249

## 2019-06-01 LAB — POCT ACTIVATED CLOTTING TIME
Activated Clotting Time: 268 seconds
Activated Clotting Time: 290 seconds

## 2019-06-01 LAB — BASIC METABOLIC PANEL
Anion gap: 10 (ref 5–15)
BUN: 16 mg/dL (ref 8–23)
CO2: 25 mmol/L (ref 22–32)
Calcium: 9.3 mg/dL (ref 8.9–10.3)
Chloride: 106 mmol/L (ref 98–111)
Creatinine, Ser: 0.9 mg/dL (ref 0.44–1.00)
GFR calc Af Amer: >60 mL/min (ref >60)
GFR calc non Af Amer: >60 mL/min (ref >60)
Glucose, Bld: 99 mg/dL (ref 70–99)
Potassium: 3.3 mmol/L — ABNORMAL LOW (ref 3.5–5.1)
Sodium: 141 mmol/L (ref 135–145)

## 2019-06-01 LAB — CBC
HCT: 41 % (ref 36.0–46.0)
Hemoglobin: 13 g/dL (ref 12.0–15.0)
MCH: 29.5 pg (ref 26.0–34.0)
MCHC: 31.7 g/dL (ref 30.0–36.0)
MCV: 93 fL (ref 80.0–100.0)
Platelets: 275 10*3/uL (ref 150–400)
RBC: 4.41 MIL/uL (ref 3.87–5.11)
RDW: 11.9 % (ref 11.5–15.5)
WBC: 7.5 10*3/uL (ref 4.0–10.5)
nRBC: 0 % (ref 0.0–0.2)

## 2019-06-01 LAB — LIPID PANEL
Cholesterol: 198 mg/dL (ref 0–200)
HDL: 50 mg/dL (ref >40)
LDL Cholesterol: 128 mg/dL — ABNORMAL HIGH (ref 0–99)
Total CHOL/HDL Ratio: 4 RATIO
Triglycerides: 98 mg/dL (ref <150)
VLDL: 20 mg/dL (ref 0–40)

## 2019-06-01 LAB — HEPARIN LEVEL (UNFRACTIONATED): Heparin Unfractionated: 0.57 IU/mL (ref 0.30–0.70)

## 2019-06-01 SURGERY — LEFT HEART CATH AND CORONARY ANGIOGRAPHY
Anesthesia: LOCAL

## 2019-06-01 MED ORDER — HEPARIN (PORCINE) IN NACL 1000-0.9 UT/500ML-% IV SOLN
INTRAVENOUS | Status: DC | PRN
  Administered 2019-06-01: 500 mL

## 2019-06-01 MED ORDER — SODIUM CHLORIDE 0.9 % IV SOLN
INTRAVENOUS | Status: AC | PRN
  Administered 2019-06-01: 72 mL/h via INTRAVENOUS

## 2019-06-01 MED ORDER — VERAPAMIL HCL 2.5 MG/ML IV SOLN
INTRAVENOUS | Status: DC | PRN
  Administered 2019-06-01: 10 mL via INTRA_ARTERIAL

## 2019-06-01 MED ORDER — TICAGRELOR 90 MG PO TABS
ORAL_TABLET | ORAL | Status: DC | PRN
  Administered 2019-06-01: 180 mg via ORAL

## 2019-06-01 MED ORDER — SODIUM CHLORIDE 0.9% FLUSH
3.0000 mL | Freq: Two times a day (BID) | INTRAVENOUS | Status: DC
  Administered 2019-06-01 – 2019-06-02 (×2): 3 mL via INTRAVENOUS

## 2019-06-01 MED ORDER — LABETALOL HCL 5 MG/ML IV SOLN: 10.0000 mg | INTRAVENOUS | Status: AC | PRN

## 2019-06-01 MED ORDER — SODIUM CHLORIDE 0.9 % IV SOLN: 250.0000 mL | INTRAVENOUS | Status: DC | PRN

## 2019-06-01 MED ORDER — NITROGLYCERIN 1 MG/10 ML FOR IR/CATH LAB
INTRA_ARTERIAL | Status: AC
  Filled 2019-06-01: qty 10

## 2019-06-01 MED ORDER — HYDRALAZINE HCL 20 MG/ML IJ SOLN
10.0000 mg | INTRAMUSCULAR | Status: AC | PRN
  Administered 2019-06-01: 10 mg via INTRAVENOUS
  Filled 2019-06-01: qty 1

## 2019-06-01 MED ORDER — HEPARIN SODIUM (PORCINE) 1000 UNIT/ML IJ SOLN
INTRAMUSCULAR | Status: DC | PRN
  Administered 2019-06-01: 4000 [IU] via INTRAVENOUS
  Administered 2019-06-01: 2000 [IU] via INTRAVENOUS
  Administered 2019-06-01: 3000 [IU] via INTRAVENOUS

## 2019-06-01 MED ORDER — MIDAZOLAM HCL 2 MG/2ML IJ SOLN
INTRAMUSCULAR | Status: AC
  Filled 2019-06-01: qty 2

## 2019-06-01 MED ORDER — MIDAZOLAM HCL 2 MG/2ML IJ SOLN
INTRAMUSCULAR | Status: DC | PRN
  Administered 2019-06-01: 1 mg via INTRAVENOUS
  Administered 2019-06-01: 2 mg via INTRAVENOUS

## 2019-06-01 MED ORDER — IOHEXOL 350 MG/ML SOLN
INTRAVENOUS | Status: DC | PRN
  Administered 2019-06-01: 100 mL via INTRA_ARTERIAL

## 2019-06-01 MED ORDER — BIMATOPROST 0.01 % OP SOLN
1.0000 [drp] | Freq: Every day | OPHTHALMIC | Status: DC
  Administered 2019-06-01: 1 [drp] via OPHTHALMIC

## 2019-06-01 MED ORDER — HEPARIN (PORCINE) IN NACL 1000-0.9 UT/500ML-% IV SOLN
INTRAVENOUS | Status: AC
  Filled 2019-06-01: qty 1000

## 2019-06-01 MED ORDER — FENTANYL CITRATE (PF) 100 MCG/2ML IJ SOLN
INTRAMUSCULAR | Status: DC | PRN
  Administered 2019-06-01 (×2): 25 ug via INTRAVENOUS

## 2019-06-01 MED ORDER — LIDOCAINE HCL (PF) 1 % IJ SOLN
INTRAMUSCULAR | Status: AC
  Filled 2019-06-01: qty 30

## 2019-06-01 MED ORDER — POTASSIUM CHLORIDE CRYS ER 20 MEQ PO TBCR
40.0000 meq | EXTENDED_RELEASE_TABLET | Freq: Once | ORAL | Status: AC
  Administered 2019-06-01: 40 meq via ORAL
  Filled 2019-06-01: qty 2

## 2019-06-01 MED ORDER — VERAPAMIL HCL 2.5 MG/ML IV SOLN
INTRAVENOUS | Status: AC
  Filled 2019-06-01: qty 2

## 2019-06-01 MED ORDER — TICAGRELOR 90 MG PO TABS
ORAL_TABLET | ORAL | Status: AC
  Filled 2019-06-01: qty 2

## 2019-06-01 MED ORDER — SODIUM CHLORIDE 0.9 % WEIGHT BASED INFUSION
1.0000 mL/kg/h | INTRAVENOUS | Status: AC
  Administered 2019-06-01: 1 mL/kg/h via INTRAVENOUS

## 2019-06-01 MED ORDER — TICAGRELOR 90 MG PO TABS
90.0000 mg | ORAL_TABLET | Freq: Two times a day (BID) | ORAL | Status: DC
  Administered 2019-06-01 – 2019-06-02 (×2): 90 mg via ORAL
  Filled 2019-06-01 (×2): qty 1

## 2019-06-01 MED ORDER — DORZOLAMIDE HCL-TIMOLOL MAL 2-0.5 % OP SOLN
1.0000 [drp] | Freq: Two times a day (BID) | OPHTHALMIC | Status: DC
  Administered 2019-06-02: 1 [drp] via OPHTHALMIC

## 2019-06-01 MED ORDER — HEPARIN SODIUM (PORCINE) 1000 UNIT/ML IJ SOLN
INTRAMUSCULAR | Status: AC
  Filled 2019-06-01: qty 1

## 2019-06-01 MED ORDER — NITROGLYCERIN 1 MG/10 ML FOR IR/CATH LAB
INTRA_ARTERIAL | Status: DC | PRN
  Administered 2019-06-01: 150 ug via INTRACORONARY
  Administered 2019-06-01: 100 ug via INTRACORONARY

## 2019-06-01 MED ORDER — FENTANYL CITRATE (PF) 100 MCG/2ML IJ SOLN
INTRAMUSCULAR | Status: AC
  Filled 2019-06-01: qty 2

## 2019-06-01 MED ORDER — PILOCARPINE HCL 1 % OP SOLN
1.0000 [drp] | Freq: Two times a day (BID) | OPHTHALMIC | Status: DC
  Administered 2019-06-02: 1 [drp] via OPHTHALMIC

## 2019-06-01 MED ORDER — LIDOCAINE HCL (PF) 1 % IJ SOLN
INTRAMUSCULAR | Status: DC | PRN
  Administered 2019-06-01: 2 mL via INTRADERMAL

## 2019-06-01 MED ORDER — SODIUM CHLORIDE 0.9% FLUSH: 3.0000 mL | INTRAVENOUS | Status: DC | PRN

## 2019-06-01 SURGICAL SUPPLY — 19 items
BALLN SAPPHIRE 2.0X12 (BALLOONS) ×2
BALLN SAPPHIRE ~~LOC~~ 2.75X8 (BALLOONS) ×1 IMPLANT
BALLOON SAPPHIRE 2.0X12 (BALLOONS) IMPLANT
CATH 5FR JL3.5 JR4 ANG PIG MP (CATHETERS) ×1 IMPLANT
CATH LAUNCHER 5F EBU3.0 (CATHETERS) IMPLANT
CATHETER LAUNCHER 5F EBU3.0 (CATHETERS) ×2
DEVICE RAD TR BAND REGULAR (VASCULAR PRODUCTS) ×1 IMPLANT
GLIDESHEATH SLEND SS 6F .021 (SHEATH) ×1 IMPLANT
GUIDEWIRE INQWIRE 1.5J.035X260 (WIRE) IMPLANT
INQWIRE 1.5J .035X260CM (WIRE) ×2
KIT ENCORE 26 ADVANTAGE (KITS) ×1 IMPLANT
KIT HEART LEFT (KITS) ×2 IMPLANT
PACK CARDIAC CATHETERIZATION (CUSTOM PROCEDURE TRAY) ×2 IMPLANT
STENT SYNERGY DES 2.5X12 (Permanent Stent) ×1 IMPLANT
TRANSDUCER W/STOPCOCK (MISCELLANEOUS) ×2 IMPLANT
TUBING CIL FLEX 10 FLL-RA (TUBING) ×2 IMPLANT
WIRE COUGAR XT STRL 190CM (WIRE) ×1 IMPLANT
WIRE HI TORQ VERSACORE-J 145CM (WIRE) ×1 IMPLANT
WIRE HI TORQ WHISPER MS 190CM (WIRE) ×1 IMPLANT

## 2019-06-01 NOTE — H&P (View-Only) (Signed)
Progress Note  Patient Name: [name] Date of Encounter: 06/01/2019  Primary Cardiologist: Dorris Carnes, MD   Subjective   Patient is feeling better today. Says she still has some intermittent tightness in her chest.No SOB. Cath today.   Inpatient Medications    Scheduled Meds: . aspirin EC  81 mg Oral Daily  . dorzolamide-timolol  1 drop Left Eye BID  . ezetimibe  10 mg Oral Daily  . famotidine  20 mg Oral BID  . latanoprost  1 drop Both Eyes QHS  . levothyroxine  50 mcg Oral QAC breakfast  . metoprolol tartrate  25 mg Oral BID  . pilocarpine  1 drop Left Eye BID  . sodium chloride flush  3 mL Intravenous Once  . sodium chloride flush  3 mL Intravenous Q12H   Continuous Infusions: . sodium chloride    . sodium chloride 1 mL/kg/hr (06/01/19 0559)  . heparin 800 Units/hr (05/31/19 1210)  . nitroGLYCERIN 5 mcg/min (05/31/19 1759)   PRN Meds: sodium chloride, acetaminophen, nitroGLYCERIN, ondansetron (ZOFRAN) IV, sodium chloride flush   Vital Signs    Vitals:   05/31/19 1843 05/31/19 1941 05/31/19 2010 06/01/19 0458  BP: 128/80 125/63 (!) 135/58 122/65  Pulse:   74 81  Resp:   17 16  Temp:   98.2 F (36.8 C) 97.8 F (36.6 C)  TempSrc:   Oral Oral  SpO2:   97% 98%  Weight:    72 kg  Height:        Intake/Output Summary (Last 24 hours) at 06/01/2019 0736 Last data filed at 06/01/2019 0459 Gross per 24 hour  Intake 267.53 ml  Output 500 ml  Net -232.47 ml   Last 3 Weights 06/01/2019 05/31/2019 05/31/2019  Weight (lbs) 158 lb 12.8 oz 159 lb 161 lb 3.2 oz  Weight (kg) 72.031 kg 72.122 kg 73.12 kg      Telemetry    NSR HR 80s, occasional PVCs - Personally Reviewed  ECG    No new - Personally Reviewed  Physical Exam   GEN: No acute distress.   Neck: No JVD Cardiac: RRR, no murmurs, rubs, or gallops.  Respiratory: Clear to auscultation bilaterally. GI: Soft, nontender, non-distended  MS: No edema; No deformity. Neuro:  Nonfocal  Psych: Normal  affect   Labs    High Sensitivity Troponin:   Recent Labs  Lab 05/31/19 1020 05/31/19 1200  TROPONINIHS 348* 616*      Chemistry Recent Labs  Lab 05/31/19 1020 06/01/19 0541  NA 141 141  K 3.5 3.3*  CL 104 106  CO2 27 25  GLUCOSE 111* 99  BUN 21 16  CREATININE 0.89 0.90  CALCIUM 9.6 9.3  GFRNONAA >60 >60  GFRAA >60 >60  ANIONGAP 10 10     Hematology Recent Labs  Lab 05/31/19 1020 06/01/19 0541  WBC 11.4* 7.5  RBC 4.70 4.41  HGB 14.4 13.0  HCT 43.6 41.0  MCV 92.8 93.0  MCH 30.6 29.5  MCHC 33.0 31.7  RDW 11.9 11.9  PLT 301 275    BNPNo results for input(s): BNP, PROBNP in the last 168 hours.   DDimer No results for input(s): DDIMER in the last 168 hours.   Radiology    Dg Chest 2 View  Result Date: 05/31/2019 CLINICAL DATA:  Chest pressure EXAM: CHEST - 2 VIEW COMPARISON:  12/29/2018 FINDINGS: Lungs are clear.  No pleural effusion or pneumothorax. The heart is normal in size. Degenerative changes of the visualized thoracolumbar  spine. IMPRESSION: Normal chest radiographs. Electronically Signed   By: Julian Hy M.D.   On: 05/31/2019 11:36    Cardiac Studies   Echo 4/20  1. The left ventricle has normal systolic function with an ejection fraction of 60-65%. The cavity size was normal. Left ventricular diastolic function could not be evaluated due to nondiagnostic images. No evidence of left ventricular regional wall  motion abnormalities.  2. The right ventricle has normal systolic function. The cavity was normal. There is no increase in right ventricular wall thickness. Right ventricular systolic pressure could not be assessed.  3. Left atrial size was not assessed.  4. Trivial pericardial effusion is present.  5. The mitral valve is grossly normal. Mild thickening of the mitral valve leaflet.  6. The aortic valve is tricuspid. Mild calcification of the aortic valve.  CTA 12/2018 IMPRESSION: 1. Coronary artery calcium score 157 Agatston units.  This places the patient in the 64th percentile for age and gender, suggesting intermediate risk for future cardiac events.  2. There does not appear to be obstructive coronary disease present. There is an area in the mid LCx obscured by artifact but suspect there is not severe stenosis at this location.   Patient Profile     78 y.o. female with CAD by CT scan, dyslipidemia, and HTN admitted with NSTEMI.  Assessment & Plan    NSTEMI  - Patient came to the ER 9/20 for chest pain resolved by NTG.  - ECG is unremarkable  - troponins elevated 348 > 616 - She has known disease and multiple cardiac risk factors.  - CT coronary angiogram performed 12/2018. Calcium score was 157. Nonobstructive CAD noted - o 4/20 Ech 60-65%, no evidence of regional wall motion abnormalities - On IV heparin in anticipation of cath today.  - Creatinine stable, 0.90 - ASA, statin and continue home meds - Further recommendations post cath  Dyslipidemia  - Continue Zetia - Sounds like she does not tolerate statins.  - LDL 128 06/01/19 - Patient is currently in a trial with Dr. Stanford Breed   HTN - Continue Lopressor 25 mg - Pressures improving - Patient says she has good BP at home normally  Hypokalemia - 3.3  - Will supplement  For questions or updates, please contact [facility] Please consult [email or web address] for contact info under        Signed, Cadence Ninfa Meeker, PA-C  06/01/2019, 7:36 AM    I have examined the patient and reviewed assessment and plan and discussed with patient.  Agree with above as stated.    Cath procedure explained to patient and her daughter.  All questions answered.  CAth later today.  No CP on IV heparin. We spoke about longterm RF modification including improving diet.  Larae Grooms

## 2019-06-01 NOTE — Progress Notes (Signed)
Venora Maples RN asked me to come assess pt hand and arm at TR band. Pt's arm above TR band tight, but appears to be from placement not hematoma. Hand is very tight and purple, yet good cap refill and warm to touch. Cath lab called to assess. Cath lab tech stated at bedside hand and arm was stable and to cont to monitor. Carroll Kinds RN

## 2019-06-01 NOTE — Progress Notes (Signed)
Venora Maples RN asked me to assess again Right arm again. Hand still shade of purple. Has hematoma on top of hand at pointer finger size of 50 cent piece and slightly raise, appears to be from previous needle still. Pet[place]ia noted scattered on top of hand. Cap refill remains good, but fingers cooler to touch. Called cath lab and Tammy up to assess. She repositioned the band and pt color returned to normal. Arm above band does have bruising, but no hematoma to arm. Also notified Dr. Burt Knack, he came to bedside. Stated arm was stable and to continue to remove air from band. Carroll Kinds RN

## 2019-06-01 NOTE — Progress Notes (Signed)
[place] for heparin Indication: chest pain/ACS  Allergies  Allergen Reactions  . Adhesive [Tape] Other (See Comments)    Causes skin to turn red where touched  . Rosuvastatin Other (See Comments)    Joint & muscle aches, sleep issues.    Patient Measurements: Height: 5\' 2"  (157.5 cm) Weight: 158 lb 12.8 oz (72 kg) IBW/kg (Calculated) : 50.1 Heparin Dosing Weight: 66.8kg  Vital Signs: Temp: 97.8 F (36.6 C) (09/21 0458) Temp Source: Oral (09/21 0458) BP: 122/65 (09/21 0458) Pulse Rate: 81 (09/21 0458)  Labs: Recent Labs    05/31/19 1020 05/31/19 1200 05/31/19 2005 06/01/19 0541  HGB 14.4  --   --  13.0  HCT 43.6  --   --  41.0  PLT 301  --   --  275  HEPARINUNFRC  --   --  0.49 0.57  CREATININE 0.89  --   --  0.90  TROPONINIHS 348* 616*  --   --     Estimated Creatinine Clearance: 48.7 mL/min (by C-G formula based on SCr of 0.9 mg/dL).   Medical History: Past Medical History:  Diagnosis Date  . Abnormal thyroid function test 07/01/2013  . Anxiety   . Arthritis   . Cancer ([place])   . Dermatitis 11/25/2013  . Glaucoma 11/02/2012  . HTN (hypertension) 11/02/2012  . 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 Hyperlipidemia   . Hypothyroid   . Obesity     Medications:  Infusions:  . sodium chloride    . sodium chloride 1 mL/kg/hr (06/01/19 0559)  . heparin 800 Units/hr (05/31/19 1210)  . nitroGLYCERIN 5 mcg/min (05/31/19 1759)    Assessment: 36 yof presented to the ED with CP on IV heparin.  She is not on anticoagulation PTA.  -heparin level is at goal, CBC stable  Goal of Therapy:  Heparin level 0.3-0.7 units/ml Monitor platelets by anticoagulation protocol: Yes   Plan:  Continue heparin at 800 units/hr Daily heparin level and CBC   Hildred Laser, PharmD Clinical Pharmacist **Pharmacist phone directory can now be found on [email or web address] ([email or web address]).  Listed under [place].

## 2019-06-01 NOTE — Progress Notes (Addendum)
Progress Note  Patient Name: [name] Date of Encounter: 06/01/2019  Primary Cardiologist: Dorris Carnes, MD   Subjective   Patient is feeling better today. Says she still has some intermittent tightness in her chest.No SOB. Cath today.   Inpatient Medications    Scheduled Meds: . aspirin EC  81 mg Oral Daily  . dorzolamide-timolol  1 drop Left Eye BID  . ezetimibe  10 mg Oral Daily  . famotidine  20 mg Oral BID  . latanoprost  1 drop Both Eyes QHS  . levothyroxine  50 mcg Oral QAC breakfast  . metoprolol tartrate  25 mg Oral BID  . pilocarpine  1 drop Left Eye BID  . sodium chloride flush  3 mL Intravenous Once  . sodium chloride flush  3 mL Intravenous Q12H   Continuous Infusions: . sodium chloride    . sodium chloride 1 mL/kg/hr (06/01/19 0559)  . heparin 800 Units/hr (05/31/19 1210)  . nitroGLYCERIN 5 mcg/min (05/31/19 1759)   PRN Meds: sodium chloride, acetaminophen, nitroGLYCERIN, ondansetron (ZOFRAN) IV, sodium chloride flush   Vital Signs    Vitals:   05/31/19 1843 05/31/19 1941 05/31/19 2010 06/01/19 0458  BP: 128/80 125/63 (!) 135/58 122/65  Pulse:   74 81  Resp:   17 16  Temp:   98.2 F (36.8 C) 97.8 F (36.6 C)  TempSrc:   Oral Oral  SpO2:   97% 98%  Weight:    72 kg  Height:        Intake/Output Summary (Last 24 hours) at 06/01/2019 0736 Last data filed at 06/01/2019 0459 Gross per 24 hour  Intake 267.53 ml  Output 500 ml  Net -232.47 ml   Last 3 Weights 06/01/2019 05/31/2019 05/31/2019  Weight (lbs) 158 lb 12.8 oz 159 lb 161 lb 3.2 oz  Weight (kg) 72.031 kg 72.122 kg 73.12 kg      Telemetry    NSR HR 80s, occasional PVCs - Personally Reviewed  ECG    No new - Personally Reviewed  Physical Exam   GEN: No acute distress.   Neck: No JVD Cardiac: RRR, no murmurs, rubs, or gallops.  Respiratory: Clear to auscultation bilaterally. GI: Soft, nontender, non-distended  MS: No edema; No deformity. Neuro:  Nonfocal  Psych: Normal  affect   Labs    High Sensitivity Troponin:   Recent Labs  Lab 05/31/19 1020 05/31/19 1200  TROPONINIHS 348* 616*      Chemistry Recent Labs  Lab 05/31/19 1020 06/01/19 0541  NA 141 141  K 3.5 3.3*  CL 104 106  CO2 27 25  GLUCOSE 111* 99  BUN 21 16  CREATININE 0.89 0.90  CALCIUM 9.6 9.3  GFRNONAA >60 >60  GFRAA >60 >60  ANIONGAP 10 10     Hematology Recent Labs  Lab 05/31/19 1020 06/01/19 0541  WBC 11.4* 7.5  RBC 4.70 4.41  HGB 14.4 13.0  HCT 43.6 41.0  MCV 92.8 93.0  MCH 30.6 29.5  MCHC 33.0 31.7  RDW 11.9 11.9  PLT 301 275    BNPNo results for input(s): BNP, PROBNP in the last 168 hours.   DDimer No results for input(s): DDIMER in the last 168 hours.   Radiology    Dg Chest 2 View  Result Date: 05/31/2019 CLINICAL DATA:  Chest pressure EXAM: CHEST - 2 VIEW COMPARISON:  12/29/2018 FINDINGS: Lungs are clear.  No pleural effusion or pneumothorax. The heart is normal in size. Degenerative changes of the visualized thoracolumbar  spine. IMPRESSION: Normal chest radiographs. Electronically Signed   By: Julian Hy M.D.   On: 05/31/2019 11:36    Cardiac Studies   Echo 4/20  1. The left ventricle has normal systolic function with an ejection fraction of 60-65%. The cavity size was normal. Left ventricular diastolic function could not be evaluated due to nondiagnostic images. No evidence of left ventricular regional wall  motion abnormalities.  2. The right ventricle has normal systolic function. The cavity was normal. There is no increase in right ventricular wall thickness. Right ventricular systolic pressure could not be assessed.  3. Left atrial size was not assessed.  4. Trivial pericardial effusion is present.  5. The mitral valve is grossly normal. Mild thickening of the mitral valve leaflet.  6. The aortic valve is tricuspid. Mild calcification of the aortic valve.  CTA 12/2018 IMPRESSION: 1. Coronary artery calcium score 157 Agatston units.  This places the patient in the 64th percentile for age and gender, suggesting intermediate risk for future cardiac events.  2. There does not appear to be obstructive coronary disease present. There is an area in the mid LCx obscured by artifact but suspect there is not severe stenosis at this location.   Patient Profile     78 y.o. female with CAD by CT scan, dyslipidemia, and HTN admitted with NSTEMI.  Assessment & Plan    NSTEMI  - Patient came to the ER 9/20 for chest pain resolved by NTG.  - ECG is unremarkable  - troponins elevated 348 > 616 - She has known disease and multiple cardiac risk factors.  - CT coronary angiogram performed 12/2018. Calcium score was 157. Nonobstructive CAD noted - o 4/20 Ech 60-65%, no evidence of regional wall motion abnormalities - On IV heparin in anticipation of cath today.  - Creatinine stable, 0.90 - ASA, statin and continue home meds - Further recommendations post cath  Dyslipidemia  - Continue Zetia - Sounds like she does not tolerate statins.  - LDL 128 06/01/19 - Patient is currently in a trial with Dr. Stanford Breed   HTN - Continue Lopressor 25 mg - Pressures improving - Patient says she has good BP at home normally  Hypokalemia - 3.3  - Will supplement  For questions or updates, please contact [facility] Please consult [email or web address] for contact info under        Signed, Cadence Ninfa Meeker, PA-C  06/01/2019, 7:36 AM    I have examined the patient and reviewed assessment and plan and discussed with patient.  Agree with above as stated.    Cath procedure explained to patient and her daughter.  All questions answered.  CAth later today.  No CP on IV heparin. We spoke about longterm RF modification including improving diet.  Larae Grooms

## 2019-06-01 NOTE — Interval H&P Note (Signed)
Cath Lab Visit (complete for each Cath Lab visit)  Clinical Evaluation Leading to the Procedure:   ACS: Yes.    Non-ACS:    Anginal Classification: CCS III  Anti-ischemic medical therapy: Minimal Therapy (1 class of medications)  Non-Invasive Test Results: No non-invasive testing performed  Prior CABG: No previous CABG  History and Physical Interval Note:  06/01/2019 11:58 AM  [name]  has presented today for surgery, with the diagnosis of NSTEMI.  The various methods of treatment have been discussed with the patient and family. After consideration of risks, benefits and other options for treatment, the patient has consented to  Procedure(s): LEFT HEART CATH AND CORONARY ANGIOGRAPHY (N/A) as a surgical intervention.  The patient's history has been reviewed, patient examined, no change in status, stable for surgery.  I have reviewed the patient's chart and labs.  Questions were answered to the patient's satisfaction.     Sherren Mocha

## 2019-06-02 DIAGNOSIS — E876 Hypokalemia: Secondary | ICD-10-CM

## 2019-06-02 DIAGNOSIS — I2510 Atherosclerotic heart disease of native coronary artery without angina pectoris: Secondary | ICD-10-CM

## 2019-06-02 LAB — CBC
HCT: 38.8 % (ref 36.0–46.0)
Hemoglobin: 12.3 g/dL (ref 12.0–15.0)
MCH: 29.5 pg (ref 26.0–34.0)
MCHC: 31.7 g/dL (ref 30.0–36.0)
MCV: 93 fL (ref 80.0–100.0)
Platelets: 268 10*3/uL (ref 150–400)
RBC: 4.17 MIL/uL (ref 3.87–5.11)
RDW: 12.1 % (ref 11.5–15.5)
WBC: 7.5 10*3/uL (ref 4.0–10.5)
nRBC: 0 % (ref 0.0–0.2)

## 2019-06-02 LAB — BASIC METABOLIC PANEL
Anion gap: 9 (ref 5–15)
BUN: 14 mg/dL (ref 8–23)
CO2: 22 mmol/L (ref 22–32)
Calcium: 9.1 mg/dL (ref 8.9–10.3)
Chloride: 109 mmol/L (ref 98–111)
Creatinine, Ser: 0.81 mg/dL (ref 0.44–1.00)
GFR calc Af Amer: >60 mL/min (ref >60)
GFR calc non Af Amer: >60 mL/min (ref >60)
Glucose, Bld: 95 mg/dL (ref 70–99)
Potassium: 3.6 mmol/L (ref 3.5–5.1)
Sodium: 140 mmol/L (ref 135–145)

## 2019-06-02 MED ORDER — HEART ATTACK BOUNCING BOOK
Freq: Once | Status: AC
  Administered 2019-06-02: 03:00:00
  Filled 2019-06-02: qty 1

## 2019-06-02 MED ORDER — TICAGRELOR 90 MG PO TABS: 90.0000 mg | ORAL_TABLET | Freq: Two times a day (BID) | ORAL | 12 refills | Status: DC

## 2019-06-02 MED ORDER — NITROGLYCERIN 0.4 MG SL SUBL: 0.4000 mg | SUBLINGUAL_TABLET | SUBLINGUAL | 12 refills | Status: DC | PRN

## 2019-06-02 MED ORDER — ANGIOPLASTY BOOK
Freq: Once | Status: AC
  Administered 2019-06-02: 03:00:00
  Filled 2019-06-02: qty 1

## 2019-06-02 MED FILL — NITROGLYCERIN 0.4 MG TAB SL: 0.4 | 8 days supply | Qty: 25 | Fill #0

## 2019-06-02 MED FILL — BRILINTA 90 MG TABLET: 90 | 30 days supply | Qty: 60 | Fill #0

## 2019-06-02 NOTE — Progress Notes (Signed)
CARDIAC REHAB PHASE I   PRE:  Rate/Rhythm: 97 SR  BP:  Supine: 146/74  Sitting:   Standing:    SaO2: 96%RA  MODE:  Ambulation: 470 ft   POST:  Rate/Rhythm: 103 ST  BP:  Supine: 157/64  Sitting:   Standing:    SaO2: 96%RA 0815-0931 Pt walked 470 ft on RA with steady gait and no CP. Tolerated well. MI education completed with pt who voiced understanding. Stressed importance of brilinta with stent. Reviewed NTG use, MI restrictions, heart healthy food choices, walking for ex, CRP 2. Pt stated her husband attended CRP 2 [place] in past. She is glad to be referred and wants to attend. Will not refer for APP as pt stated not competent with APPs.  She is going to make an appointment with Nutritionist that she has used in the past.   Graylon Good, RN BSN  06/02/2019 9:27 AM

## 2019-06-02 NOTE — Care Management (Signed)
1330 06-02-19 Brilinta to be filled via the [facility]. $33.60 will be the cost for the next Rx's outpatient- pt is aware of cost and felt that it is reasonable. No further needs from CM at this time. Bethena Roys, RN BSN Case Manager [phone]

## 2019-06-02 NOTE — TOC Benefit Eligibility Note (Signed)
Transition of Care [facility]) Benefit Eligibility Note    Patient Details  Name: [name] MRN: [id number] Date of Birth: May 22, 1941   Medication/Dose: Kary Kos        Prescription Coverage Preferred Pharmacy: most major retail pharmacies  Spoke with Person/Company/Phone Number:: [employer]  Co-Pay: $35  Prior Approval: No          Delorse Lek Phone Number: 06/02/2019, 3:13 PM

## 2019-06-02 NOTE — Progress Notes (Signed)
TR BAND REMOVAL  LOCATION:    right radial  DEFLATED PER PROTOCOL:    Yes.    TIME BAND OFF / DRESSING APPLIED:    2015   SITE UPON ARRIVAL:    Level 2  SITE AFTER BAND REMOVAL:    Level 1(bruising)  CIRCULATION SENSATION AND MOVEMENT:    Within Normal Limits   Yes.    COMMENTS:   Pt.tolerated well

## 2019-06-02 NOTE — Progress Notes (Addendum)
Progress Note  Patient Name: [name] Date of Encounter: 06/02/2019  Primary Cardiologist: Dorris Carnes, MD   Subjective   Patient is doing well this morning. No chest pain or sob. Patient is ambulating without symptoms. Cath site is clean and dry with some bruising on the forearm.   Inpatient Medications    Scheduled Meds: . aspirin EC  81 mg Oral Daily  . bimatoprost  1 drop Both Eyes QHS  . dorzolamide-timolol  1 drop Left Eye BID WC  . ezetimibe  10 mg Oral Daily  . famotidine  20 mg Oral BID  . levothyroxine  50 mcg Oral QAC breakfast  . metoprolol tartrate  25 mg Oral BID  . pilocarpine  1 drop Left Eye BID WC  . sodium chloride flush  3 mL Intravenous Once  . sodium chloride flush  3 mL Intravenous Q12H  . sodium chloride flush  3 mL Intravenous Q12H  . ticagrelor  90 mg Oral BID   Continuous Infusions: . sodium chloride     PRN Meds: sodium chloride, acetaminophen, nitroGLYCERIN, ondansetron (ZOFRAN) IV, sodium chloride flush   Vital Signs    Vitals:   06/02/19 0115 06/02/19 0216 06/02/19 0316 06/02/19 0523  BP: (!) 96/45 (!) 89/50 (!) 129/55 (!) 131/56  Pulse:    75  Resp:      Temp:    97.9 F (36.6 C)  TempSrc:    Oral  SpO2: 96% 96% 95% 95%  Weight:    71.8 kg  Height:        Intake/Output Summary (Last 24 hours) at 06/02/2019 0831 Last data filed at 06/02/2019 0351 Gross per 24 hour  Intake 732.27 ml  Output 300 ml  Net 432.27 ml   Last 3 Weights 06/02/2019 06/01/2019 05/31/2019  Weight (lbs) 158 lb 4.8 oz 158 lb 12.8 oz 159 lb  Weight (kg) 71.804 kg 72.031 kg 72.122 kg      Telemetry    NSR, HR currently 60s, previously in 80s - Personally Reviewed  ECG    NSR, 77 bpm, poor r wave progression, possible LAD, nonspecific ST changes III - Personally Reviewed  Physical Exam   GEN: No acute distress.   Neck: No JVD Cardiac: RRR, no murmurs, rubs, or gallops.  Respiratory: Clear to auscultation bilaterally. GI: Soft, nontender,  non-distended  MS: No edema; No deformity; cath site is clean and dry with hematoma on forearm Neuro:  Nonfocal  Psych: Normal affect   Labs    High Sensitivity Troponin:   Recent Labs  Lab 05/31/19 1020 05/31/19 1200  TROPONINIHS 348* 616*      Chemistry Recent Labs  Lab 05/31/19 1020 06/01/19 0541 06/02/19 0343  NA 141 141 140  K 3.5 3.3* 3.6  CL 104 106 109  CO2 27 25 22   GLUCOSE 111* 99 95  BUN 21 16 14   CREATININE 0.89 0.90 0.81  CALCIUM 9.6 9.3 9.1  GFRNONAA >60 >60 >60  GFRAA >60 >60 >60  ANIONGAP 10 10 9      Hematology Recent Labs  Lab 05/31/19 1020 06/01/19 0541 06/02/19 0343  WBC 11.4* 7.5 7.5  RBC 4.70 4.41 4.17  HGB 14.4 13.0 12.3  HCT 43.6 41.0 38.8  MCV 92.8 93.0 93.0  MCH 30.6 29.5 29.5  MCHC 33.0 31.7 31.7  RDW 11.9 11.9 12.1  PLT 301 275 268    BNPNo results for input(s): BNP, PROBNP in the last 168 hours.   DDimer No results for input(s):  DDIMER in the last 168 hours.   Radiology    Dg Chest 2 View  Result Date: 05/31/2019 CLINICAL DATA:  Chest pressure EXAM: CHEST - 2 VIEW COMPARISON:  12/29/2018 FINDINGS: Lungs are clear.  No pleural effusion or pneumothorax. The heart is normal in size. Degenerative changes of the visualized thoracolumbar spine. IMPRESSION: Normal chest radiographs. Electronically Signed   By: Julian Hy M.D.   On: 05/31/2019 11:36    Cardiac Studies   Cardiac Cath 06/01/19 1) Severe single vessel CAD with critical stenosis of the mid-circumflex, treated successfully with PTCA and stenting (2.5x12 mm Synergy DES 2) nonobstructive LAD stenosis 3) widely patent RCA, dominant vessel 4) normal LV systolic function  Recommend: DAPT with ASA and ticagrelor x 12 months without interruption. Pt loaded with ticagrelor 180 mg on the cath lab table. DC home tomorrow am if no complications arise.   Echo 12/30/19  1. The left ventricle has normal systolic function with an ejection fraction of 60-65%. The cavity  size was normal. Left ventricular diastolic function could not be evaluated due to nondiagnostic images. No evidence of left ventricular regional wall  motion abnormalities.  2. The right ventricle has normal systolic function. The cavity was normal. There is no increase in right ventricular wall thickness. Right ventricular systolic pressure could not be assessed.  3. Left atrial size was not assessed.  4. Trivial pericardial effusion is present.  5. The mitral valve is grossly normal. Mild thickening of the mitral valve leaflet.  6. The aortic valve is tricuspid. Mild calcification of the aortic valve.  CTA 12/2018 IMPRESSION: 1. Coronary artery calcium score 157 Agatston units. This places the patient in the 64th percentile for age and gender, suggesting intermediate risk for future cardiac events.  2. There does not appear to be obstructive coronary disease present. There is an area in the mid LCx obscured by artifact but suspect there is not severe stenosis at this location.   Patient Profile     78 y.o. female with CAD by CT scan , dyslipidemia, and HTN admitted witih NSTEMI  Assessment & Plan    NSTEMI/CAD s/p stent Cx - patient came into the ER 9/20 for chest pain resolved by NTG - ECG unremarkable, HS troponin 348 > 616 - CT angiogram 12/2018 with nonobstructive CAD and calcium score 157 - Echo 12/2018 EF 60-65% with no regional motion abnormalities - Cath performed yesterday showed severe single vessel CAD of the mid Cx treated successfully with PTCA and stenting - DAPT with ASA and Brilinta x 12 month. Patient received Brilinta load 180 mg yesterday - Cath site assessed yesterday by Dr. Burt Knack for possible hematoma and purple coloring of hand >>band was repositioned and color improved. Now stable.  - Patient worked with cardiac rehab and was asymptomatic with ambulation - Hgb 12.3. Creatinine 0.81 - Likely discharge today  Dyslipidemia - Continue Zetia - Does not  tolerate statins and is currently in a trial with Dr. Stanford Breed - LDL 128  - Patient follows with lipid clinic  HTN - Continue Lopressor 25 mg - pressures resonable 131/56 today  Hypokalemia - 3.3 yesterday > given 40 mEq - potassium 3.6 today  For questions or updates, please contact [facility] Please consult [email or web address] for contact info under        Signed, Cadence Ninfa Meeker, PA-C  06/02/2019, 8:31 AM    I have examined the patient and reviewed assessment and plan and discussed with patient.  Agree with above as  stated.    RRR, S1S2 Mild right wrist bruising and some swelling.  2+ right radial pulse No LE edema  I encouraged DAPT and aggressive secondary prevention,  She will do cardiac rehab as well.  Plan for discharge later today.    Larae Grooms

## 2019-06-02 NOTE — Discharge Summary (Addendum)
Discharge Summary    Patient ID: [name] MRN: [id number]; DOB: 10-31-1940  Admit date: 05/31/2019 Discharge date: 06/02/2019  Primary Care Provider: Marin Olp, MD  Primary Cardiologist: Dorris Carnes, MD  Primary Electrophysiologist:  None   Discharge Diagnoses    Principal Problem:   NSTEMI (non-ST elevated myocardial infarction) [facility]) Active Problems:   Hyperlipidemia   Hypertension   Hypokalemia   CAD (coronary artery disease) s/p stent mCx 05/2019   Allergies Allergies  Allergen Reactions  . Adhesive [Tape] Other (See Comments)    Causes skin to turn red where touched  . Rosuvastatin Other (See Comments)    Joint & muscle aches, sleep issues.    Diagnostic Studies/Procedures    Left Heart Cath 06/01/19 1) Severe single vessel CAD with critical stenosis of the mid-circumflex, treated successfully with PTCA and stenting (2.5x12 mm Synergy DES 2) nonobstructive LAD stenosis 3) widely patent RCA, dominant vessel 4) normal LV systolic function  Recommend: DAPT with ASA and ticagrelor x 12 months without interruption. Pt loaded with ticagrelor 180 mg on the cath lab table. DC home tomorrow am if no complications arise. _____________   History of Present Illness     Ms. [name] is a 78 y.o. female with CAD by CT, dyslipidemia, and HTN who was admitted for NSTEMI. The patient is followed by Dr. Harrington Challenger in the outpatient setting. Patient had a coronary CT in 12/2018 that showed calcium score of 157 and nonobstructive CAD. Echo at that time revealed an EF of 40-45%.The patient has a history of statin intolerance. She is currently on Zetia and enrolled in a trial with Dr. Stanford Breed and also follows with the lipid clinic.The patient is normally very healthy and active.   On 9/20 the patient woke up and felt chest pressure that went through to her back and did not radiate. Denied associated dyspnea or diaphoresis. No nausea or vomiting. The chest pain lasted for about  25-30 minutes and resolved on it's own. Denied leg pain or swelling. Patient went to the ED.   Hospital Course     Consultants: None  In the ED BP 177/57, pulse 70, afebrile, RR 16, and O2 100%. Labs showed hypokalemia, 3.3. Creatinine stable at 0.90. WBC 7.5, Hgb 13.0. Glucose was 99. CXR was negative for acute disease. EKG showed NSR with no ischemic changes. HS troponin was 348 > 616. Patient was admitted for STEMI and started on IV heparin and aspirin. Chest pressure resolved. The patient was set up for left heart cath. Right radial access was established. Cath showed severe single vessel CAD with critical stenosis of the mid-Cx treated successfully with PTCA and DES. Plan for DAPT with ASA and Brilinta for 1 year. Patient was loaded with 180 mg Brilinta on the cath table. Patient remained stable throughout the procedure. Post cath her right hand was noted to be discolored and Dr. Burt Knack assessed cath site. There were no signs of hematoma and cap refill was normal. The TR band was adjusted and color in her hand improved. The next morning patient was feeling good with no chest pain or sob. She walked with cardiac rehab without symptoms. She was noted to have a hematoma on her right forearm that was soft to the touch. Hgb was 12.3 and creatinine was 0.81. Plan to discharge patient on Brilinta 90 mg BID with plan for DAPT with ASA + Brilinta for 1 year. Other home medications will be continued at discharged. Patient will continue to follow up with  the lipid clinic.   Patient was seen and examined on 06/02/19 by Dr. Irish Lack and felt stable for discharge. Follow up will be arranged.   _____________  Discharge Vitals Blood pressure (!) 157/64, pulse (!) 101, temperature 97.9 F (36.6 C), temperature source Oral, resp. rate 14, height 5\' 2"  (1.575 m), weight 71.8 kg, SpO2 95 %.  Filed Weights   05/31/19 1600 06/01/19 0458 06/02/19 0523  Weight: 72.1 kg 72 kg 71.8 kg    Labs & Radiologic Studies     CBC Recent Labs    06/01/19 0541 06/02/19 0343  WBC 7.5 7.5  HGB 13.0 12.3  HCT 41.0 38.8  MCV 93.0 93.0  PLT 275 [id number]   Basic Metabolic Panel Recent Labs    06/01/19 0541 06/02/19 0343  NA 141 140  K 3.3* 3.6  CL 106 109  CO2 25 22  GLUCOSE 99 95  BUN 16 14  CREATININE 0.90 0.81  CALCIUM 9.3 9.1   Liver Function Tests No results for input(s): AST, ALT, ALKPHOS, BILITOT, PROT, ALBUMIN in the last 72 hours. No results for input(s): LIPASE, AMYLASE in the last 72 hours. High Sensitivity Troponin:   Recent Labs  Lab 05/31/19 1020 05/31/19 1200  TROPONINIHS 348* 616*    BNP Invalid input(s): POCBNP D-Dimer No results for input(s): DDIMER in the last 72 hours. Hemoglobin A1C No results for input(s): HGBA1C in the last 72 hours. Fasting Lipid Panel Recent Labs    06/01/19 0541  CHOL 198  HDL 50  LDLCALC 128*  TRIG 98  CHOLHDL 4.0   Thyroid Function Tests No results for input(s): TSH, T4TOTAL, T3FREE, THYROIDAB in the last 72 hours.  Invalid input(s): FREET3 _____________  Dg Chest 2 View  Result Date: 05/31/2019 CLINICAL DATA:  Chest pressure EXAM: CHEST - 2 VIEW COMPARISON:  12/29/2018 FINDINGS: Lungs are clear.  No pleural effusion or pneumothorax. The heart is normal in size. Degenerative changes of the visualized thoracolumbar spine. IMPRESSION: Normal chest radiographs. Electronically Signed   By: Julian Hy M.D.   On: 05/31/2019 11:36   Disposition   Pt is being discharged home today in good condition.  Follow-up Plans & Appointments     Discharge Instructions    Amb Referral to Cardiac Rehabilitation   Complete by: As directed    Diagnosis:  NSTEMI Coronary Stents     After initial evaluation and assessments completed: Virtual Based Care may be provided alone or in conjunction with Phase 2 Cardiac Rehab based on patient barriers.: Yes      Discharge Medications   Allergies as of 06/02/2019      Reactions   Adhesive [tape] Other  (See Comments)   Causes skin to turn red where touched   Rosuvastatin Other (See Comments)   Joint & muscle aches, sleep issues.      Medication List    TAKE these medications   AMBULATORY NON FORMULARY MEDICATION Take 180 mg by mouth daily. Medication Name: 180 mg bempedoic acid vs a placebo, CLEAR Research Study drug provided   aspirin EC 81 MG tablet Take 81 mg by mouth daily.   chlorthalidone 25 MG tablet Commonly known as: HYGROTON TAKE (1/2) TABLET DAILY. What changed:   how much to take  how to take this  when to take this  additional instructions   dorzolamide-timolol 22.3-6.8 MG/ML ophthalmic solution Commonly known as: COSOPT Place 1 drop into the left eye 2 (two) times daily.   ezetimibe 10 MG tablet Commonly known as:  Zetia Take 1 tablet (10 mg total) by mouth daily.   famotidine 20 MG tablet Commonly known as: Pepcid Take 1 tablet (20 mg total) by mouth 2 (two) times daily.   Krill Oil 1000 MG Caps Take 1,000 mg by mouth daily. Mega Red   levothyroxine 50 MCG tablet Commonly known as: SYNTHROID Take 1 tablet (50 mcg total) by mouth daily before breakfast.   LUMIGAN OP Place 1 drop into both eyes at bedtime.   metoprolol tartrate 25 MG tablet Commonly known as: LOPRESSOR Take 1 tablet (25 mg total) by mouth 2 (two) times daily for 30 days.   nitroGLYCERIN 0.4 MG SL tablet Commonly known as: NITROSTAT Place 1 tablet (0.4 mg total) under the tongue every 5 (five) minutes x 3 doses as needed for chest pain.   OMEGA III EPA+DHA PO Take 1 capsule by mouth daily.   pilocarpine 1 % ophthalmic solution Commonly known as: PILOCAR Place 1 drop into the left eye 2 (two) times daily.   Probiotic Daily Caps Take 1 capsule by mouth daily. Reported on 10/10/2015   SYSTANE OP Place 1-2 drops into both eyes as needed (for dry eyes).   ticagrelor 90 MG Tabs tablet Commonly known as: BRILINTA Take 1 tablet (90 mg total) by mouth 2 (two) times daily.    Vitamin D-3 125 MCG (5000 UT) Tabs Take 5,000 mg by mouth daily.        Acute coronary syndrome (MI, NSTEMI, STEMI, etc) this admission?: Yes.     AHA/ACC Clinical Performance & Quality Measures: 1. Aspirin prescribed? - Yes 2. ADP Receptor Inhibitor (Plavix/Clopidogrel, Brilinta/Ticagrelor or Effient/Prasugrel) prescribed (includes medically managed patients)? - Yes 3. Beta Blocker prescribed? - Yes 4. High Intensity Statin (Lipitor 40-80mg  or Crestor 20-40mg ) prescribed? - Yes 5. EF assessed during THIS hospitalization? - No - echo had recent echo 12/2018 6. For EF <40%, was ACEI/ARB prescribed? - Not Applicable (EF >/= [id number]) 7. For EF <40%, Aldosterone Antagonist (Spironolactone or Eplerenone) prescribed? - Not Applicable (EF >/= [id number]) 8. Cardiac Rehab Phase II ordered (Included Medically managed Patients)? - Yes     Outstanding Labs/Studies   None  Duration of Discharge Encounter   Greater than 30 minutes including physician time.  Signed, Cadence Ninfa Meeker, PA-C 06/02/2019, 12:02 PM  I have examined the patient and reviewed assessment and plan and discussed with patient.  Agree with above as stated.    RRR, S1S2 Mild right wrist bruising and some swelling.  2+ right radial pulse No LE edema  I encouraged DAPT and aggressive secondary prevention,  She will do cardiac rehab as well.  Plan for discharge later today.    Larae Grooms

## 2019-06-05 ENCOUNTER — Telehealth: Payer: Self-pay

## 2019-06-05 ENCOUNTER — Telehealth: Payer: Self-pay | Admitting: Internal Medicine

## 2019-06-05 ENCOUNTER — Ambulatory Visit: Payer: Medicare Other | Admitting: Family Medicine

## 2019-06-05 NOTE — Telephone Encounter (Signed)
Amber, please complete TCM call. Thanks!

## 2019-06-05 NOTE — Telephone Encounter (Signed)
New Message  Pt c/o BP issue: STAT if pt c/o blurred vision, one-sided weakness or slurred speech  1. What are your last 5 BP readings? 137/87 hr 75, 177/90 hr 82, 161/81 hr 73, 159/83 hr 80, 161/85 hr 76.  2. Are you having any other symptoms (ex. Dizziness, headache, blurred vision, passed out)? No  3. What is your BP issue? Patient is having issues with blood pressure being elevated.

## 2019-06-05 NOTE — Telephone Encounter (Signed)
REcomm trial of amlodipine 2.5 mg in addition to other meds

## 2019-06-05 NOTE — Telephone Encounter (Signed)
Pt called in reference to being in hosp Sunday morning. Pt stated she is concerned with her BP and would like to f.u with [name]. I offered [name] but pt only wanted to see [name]. Please advise.

## 2019-06-05 NOTE — Telephone Encounter (Signed)
Pt called to report that she has had a high BP yesterday morning... 216/101 HR 86... she called EMS and they stayed with her for quite a longtime until her BP came down to 159/78 and it stayed about that through the night.   She has a nurse friend staying with her and helping her. She has been checking her BP.   Her BP starting 4 am this morning.   137/87 HR 75 177/90 HR 82 161/81 HR 73 159/83 HR 80 and now  161/85 HR 76.   Her friend advised her to stop checking it because it is making her anxious.   She denies headache, dizziness, Sob..   Will forward to Dr. Harrington Challenger for recommendations re: her meds.   Pt will continue to monitor a few times a day.   Next OV 06/26/19

## 2019-06-08 ENCOUNTER — Other Ambulatory Visit: Payer: Self-pay

## 2019-06-08 ENCOUNTER — Encounter: Payer: Medicare Other | Admitting: *Deleted

## 2019-06-08 VITALS — BP 152/64 | HR 61 | Wt 155.0 lb

## 2019-06-08 DIAGNOSIS — Z006 Encounter for examination for normal comparison and control in clinical research program: Secondary | ICD-10-CM

## 2019-06-08 MED ORDER — AMLODIPINE BESYLATE 2.5 MG PO TABS: 2.5000 mg | ORAL_TABLET | Freq: Every day | ORAL | 3 refills | Status: DC

## 2019-06-08 NOTE — Telephone Encounter (Signed)
I spoke with pt and gave her instructions from Dr. Harrington Challenger. Will send prescription to [facility].  Pt reports BP today is 146/70.  She will continue to monitor and bring readings to appointment in October.

## 2019-06-08 NOTE — Research (Signed)
Subject to research clinic for visit T12-M30 in the clear research study.  No cos, aes to report.  End point reported to sponsor. New meds dispensed and next clinic visit scheduled.

## 2019-06-09 ENCOUNTER — Telehealth (HOSPITAL_COMMUNITY): Payer: Self-pay | Admitting: Family Medicine

## 2019-06-12 ENCOUNTER — Telehealth: Payer: Self-pay | Admitting: Internal Medicine

## 2019-06-12 NOTE — Telephone Encounter (Signed)
LMTCB

## 2019-06-12 NOTE — Telephone Encounter (Signed)
° °  Patient calling to report arm pain at night. S/p stent on  9/21

## 2019-06-12 NOTE — Telephone Encounter (Signed)
Pt called to report that her arm after her stent 06/01/19 had a hematoma and it has improved but still painful and bruised.. the edema is still in her hand but has improved.. she can still use her arm but just anxious about the healing and requesting to come in to be seen for reassurance. She also notes that Dr. Harrington Challenger just started her on Amlodipine 2.5 mg and she was needing to be seen for her BP and will start keeping track of her readings and will bring with her to the appt..   Appt with Truitt Merle NP 06/17/19  Pt still has post hosp appt with Daune Perch NP 06/26/19 and Dr.Ross 07/06/19 and will wait and not cancel until Cecille Rubin sees her and determines if we need to change any of those appts.

## 2019-06-13 NOTE — Progress Notes (Signed)
CARDIOLOGY OFFICE NOTE  Date:  06/17/2019    [name] Date of Birth: 1941-06-16 Medical Record [id number]  PCP:  Marin Olp, MD  Cardiologist:  Harrington Challenger  Chief Complaint  Patient presents with  . Follow-up    Seen for Dr. Harrington Challenger    History of Present Illness: [name] is a 78 y.o. female who presents today for a 5 month check Seen for Dr. Harrington Challenger.   She has a history of breast cancer (s/p mastectomy). Other issues include HTN and profound HLD and has had some statin intolerance - she is in the CLEAR trial.   Admitted back in April with chest pain - CT of chest/abd with extensive atherosclerosis. Cardiac CT with non obstructive disease noted. Had a telehealth visit with Dr. Harrington Challenger in early May - felt to be doing ok.   Then admitted last month with NSTEMI - underwent cath and had PCI to the [facility] -  now on DAPT. Norvasc was also started for better BP control.   The patient does not have symptoms concerning for COVID-19 infection (fever, chills, cough, or new shortness of breath).   Comes in today. Here alone.  She seems to be doing well. Did have a spell right after discharge - BP was up - may have had some chest pain - sounds like she got anxious - called EMS - everything checked out fine. She has done fine since. No arm pain. Trying to get the adhesive off her arm. Asking about PCSK9 therapy - she may be interested - apparently she has had lots of statin issues. No chest pain. Breathing is good. BP is doing better. She admits she gets a little "tense".   Past Medical History:  Diagnosis Date  . Abnormal thyroid function test 07/01/2013  . Anxiety   . Arthritis   . Cancer ([place])   . Dermatitis 11/25/2013  . Glaucoma 11/02/2012  . HTN (hypertension) 11/02/2012  . 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 Hyperlipidemia   . Hypothyroid   . Obesity     Past Surgical History:  Procedure Laterality Date  . AUGMENTATION  MAMMAPLASTY Left    05/2006  . BREAST SURGERY  10/2005   Left Mastectomy, reconstruction, reduction on right. Dr. Harlow Mares  . COLONOSCOPY    . CORONARY STENT INTERVENTION N/A 06/01/2019   Procedure: CORONARY STENT INTERVENTION;  Surgeon: Sherren Mocha, MD;  Location: [facility];  Service: Cardiovascular;  Laterality: N/A;  . CRYOTHERAPY     GYN for CIN III  . DILATION AND CURETTAGE OF UTERUS    . EXCISION MORTON'S NEUROMA     right foot  . EYE SURGERY Right 05/08/2018  . LEFT HEART CATH AND CORONARY ANGIOGRAPHY N/A 06/01/2019   Procedure: LEFT HEART CATH AND CORONARY ANGIOGRAPHY;  Surgeon: Sherren Mocha, MD;  Location: [facility];  Service: Cardiovascular;  Laterality: N/A;  . REDUCTION MAMMAPLASTY Right      Medications: Current Meds  Medication Sig  . AMBULATORY NON FORMULARY MEDICATION Take 180 mg by mouth daily. Medication Name: 180 mg bempedoic acid vs a placebo, CLEAR Research Study drug provided  . amLODipine (NORVASC) 2.5 MG tablet Take 1 tablet (2.5 mg total) by mouth daily.  Marland Kitchen aspirin EC 81 MG tablet Take 81 mg by mouth daily.  . Bimatoprost (LUMIGAN OP) Place 1 drop into both eyes at bedtime.   . chlorthalidone (HYGROTON) 25 MG tablet TAKE (  1/2) TABLET DAILY.  Marland Kitchen Cholecalciferol (VITAMIN D-3) 5000 UNITS TABS Take 5,000 mg by mouth daily.  . dorzolamide-timolol (COSOPT) 22.3-6.8 MG/ML ophthalmic solution Place 1 drop into the left eye 2 (two) times daily.   Marland Kitchen ezetimibe (ZETIA) 10 MG tablet Take 1 tablet (10 mg total) by mouth daily.  . famotidine (PEPCID) 20 MG tablet Take 1 tablet (20 mg total) by mouth 2 (two) times daily.  Javier Docker Oil 1000 MG CAPS Take 1,000 mg by mouth daily. Mega Red   . levothyroxine (SYNTHROID) 50 MCG tablet Take 1 tablet (50 mcg total) by mouth daily before breakfast.  . nitroGLYCERIN (NITROSTAT) 0.4 MG SL tablet Place 1 tablet (0.4 mg total) under the tongue every 5 (five) minutes x 3 doses as needed for chest pain.  . Omega-3 Fatty  Acids (OMEGA III EPA+DHA PO) Take 1 capsule by mouth daily.   . pilocarpine (PILOCAR) 1 % ophthalmic solution Place 1 drop into the left eye 2 (two) times daily.  Vladimir Faster Glycol-Propyl Glycol (SYSTANE OP) Place 1-2 drops into both eyes as needed (for dry eyes).   . Probiotic Product (PROBIOTIC DAILY) CAPS Take 1 capsule by mouth daily. Reported on 10/10/2015  . ticagrelor (BRILINTA) 90 MG TABS tablet Take 1 tablet (90 mg total) by mouth 2 (two) times daily.  . [DISCONTINUED] metoprolol tartrate (LOPRESSOR) 25 MG tablet Take 1 tablet (25 mg total) by mouth 2 (two) times daily for 30 days.     Allergies: Allergies  Allergen Reactions  . Adhesive [Tape] Other (See Comments)    Causes skin to turn red where touched  . Rosuvastatin Other (See Comments)    Joint & muscle aches, sleep issues.    Social History: The patient  reports that she has never smoked. She has never used smokeless tobacco. She reports current alcohol use. She reports that she does not use drugs.   Family History: The patient's family history includes CAD in her mother; Colon cancer in her paternal grandmother; Hyperlipidemia in her mother and sister; Other in her father; Parkinsonism in her mother; Pneumonia in her mother.   Review of Systems: Please see the history of present illness.   All other systems are reviewed and negative.   Physical Exam: VS:  BP 132/64   Pulse 68   Ht 5\' 2"  (1.575 m)   Wt 154 lb 12.8 oz (70.2 kg)   SpO2 99%   BMI 28.31 kg/m  .  BMI Body mass index is 28.31 kg/m.  Wt Readings from Last 3 Encounters:  06/17/19 154 lb 12.8 oz (70.2 kg)  06/08/19 155 lb (70.3 kg)  06/02/19 158 lb 4.8 oz (71.8 kg)    General: Pleasant. Alert and in no acute distress. A little anxious.    HEENT: Normal.  Neck: Supple, no JVD, carotid bruits, or masses noted.  Cardiac: Regular rate and rhythm. No murmurs, rubs, or gallops. No edema.  Respiratory:  Lungs are clear to auscultation bilaterally with  normal work of breathing.  GI: Soft and nontender.  MS: No deformity or atrophy. Gait and ROM intact.  Skin: Warm and dry. Color is normal.  Neuro:  Strength and sensation are intact and no gross focal deficits noted.  Psych: Alert, appropriate and with normal affect. Her right arm looks good - +2 radial pulse.    LABORATORY DATA:  EKG:  EKG is not ordered today.   Lab Results  Component Value Date   WBC 7.5 06/02/2019   HGB 12.3 06/02/2019  HCT 38.8 06/02/2019   PLT 268 06/02/2019   GLUCOSE 95 06/02/2019   CHOL 198 06/01/2019   TRIG 98 06/01/2019   HDL 50 06/01/2019   LDLDIRECT 114.0 04/29/2019   LDLCALC 128 (H) 06/01/2019   ALT 17 04/29/2019   AST 13 04/29/2019   NA 140 06/02/2019   K 3.6 06/02/2019   CL 109 06/02/2019   CREATININE 0.81 06/02/2019   BUN 14 06/02/2019   CO2 22 06/02/2019   TSH 1.13 04/29/2019   HGBA1C 5.5 04/21/2019     BNP (last 3 results) No results for input(s): BNP in the last 8760 hours.  ProBNP (last 3 results) No results for input(s): PROBNP in the last 8760 hours.   Other Studies Reviewed Today:  CORONARY STENT INTERVENTION 05/2019  LEFT HEART CATH AND CORONARY ANGIOGRAPHY  Conclusion  1) Severe single vessel CAD with critical stenosis of the mid-circumflex, treated successfully with PTCA and stenting (2.5x12 mm Synergy DES 2) nonobstructive LAD stenosis 3) widely patent RCA, dominant vessel 4) normal LV systolic function  Recommend: DAPT with ASA and ticagrelor x 12 months without interruption. Pt loaded with ticagrelor 180 mg on the cath lab table. DC home tomorrow am if no complications arise.      ECHO IMPRESSIONS 12/2018   1. The left ventricle has normal systolic function with an ejection fraction of 60-65%. The cavity size was normal. Left ventricular diastolic function could not be evaluated due to nondiagnostic images. No evidence of left ventricular regional wall  motion abnormalities.  2. The right ventricle has  normal systolic function. The cavity was normal. There is no increase in right ventricular wall thickness. Right ventricular systolic pressure could not be assessed.  3. Left atrial size was not assessed.  4. Trivial pericardial effusion is present.  5. The mitral valve is grossly normal. Mild thickening of the mitral valve leaflet.  6. The aortic valve is tricuspid. Mild calcification of the aortic valve.  Assessment/Plan:  1. Recent NSTEMI - s/p cath with PCI to the LCX - doing well clinically - recheck lab today. Needs aggressive CV risk factor modification.   2. HTN - Norvasc has been added to her regimen - BP looks much better - no further changes made today.    3. HLD - on Zetia - also in the CLEAR trial - she would like to discuss possible PCSK9 therapy and get Dr. Alan Ripper advice - will refer to the lipid clinic here at [facility].   4. Extensive atherosclerosis noted on prior CT chest/abdomen - needs aggressive CV risk factor modification.   5. COVID-19 Education: The signs and symptoms of COVID-19 were discussed with the patient and how to seek care for testing (follow up with PCP or arrange E-visit).  The importance of social distancing, staying at home, hand hygiene and wearing a mask when out in public were discussed today.  Current medicines are reviewed with the patient today.  The patient does not have concerns regarding medicines other than what has been noted above.  The following changes have been made:  See above.  Labs/ tests ordered today include:    Orders Placed This Encounter  Procedures  . Basic metabolic panel  . CBC  . AMB Referral to [facility]     Disposition:   FU with Dr. Harrington Challenger as planned later this month - I am happy to see back as needed.   Patient is agreeable to this plan and will call if any problems  develop in the interim.   SignedTruitt Merle, NP  06/17/2019 10:04 AM  [facility] [address] [place], [place]  [address] Phone: [phone] Fax: [phone]

## 2019-06-17 ENCOUNTER — Ambulatory Visit: Payer: Medicare Other | Admitting: Nurse Practitioner

## 2019-06-17 ENCOUNTER — Other Ambulatory Visit: Payer: Self-pay

## 2019-06-17 ENCOUNTER — Encounter: Payer: Self-pay | Admitting: Nurse Practitioner

## 2019-06-17 VITALS — BP 132/64 | HR 68 | Ht 62.0 in | Wt 154.8 lb

## 2019-06-17 DIAGNOSIS — E782 Mixed hyperlipidemia: Secondary | ICD-10-CM | POA: Diagnosis not present

## 2019-06-17 DIAGNOSIS — I2510 Atherosclerotic heart disease of native coronary artery without angina pectoris: Secondary | ICD-10-CM

## 2019-06-17 DIAGNOSIS — I10 Essential (primary) hypertension: Secondary | ICD-10-CM

## 2019-06-17 DIAGNOSIS — Z7189 Other specified counseling: Secondary | ICD-10-CM

## 2019-06-17 DIAGNOSIS — Z955 Presence of coronary angioplasty implant and graft: Secondary | ICD-10-CM | POA: Diagnosis not present

## 2019-06-17 LAB — BASIC METABOLIC PANEL
BUN/Creatinine Ratio: 23 (ref 12–28)
BUN: 22 mg/dL (ref 8–27)
CO2: 26 mmol/L (ref 20–29)
Calcium: 9.9 mg/dL (ref 8.7–10.3)
Chloride: 101 mmol/L (ref 96–106)
Creatinine, Ser: 0.95 mg/dL (ref 0.57–1.00)
GFR calc Af Amer: 67 mL/min/{1.73_m2} (ref >59)
GFR calc non Af Amer: 58 mL/min/{1.73_m2} — ABNORMAL LOW (ref >59)
Glucose: 86 mg/dL (ref 65–99)
Potassium: 3.9 mmol/L (ref 3.5–5.2)
Sodium: 141 mmol/L (ref 134–144)

## 2019-06-17 LAB — CBC
Hematocrit: 40.1 % (ref 34.0–46.6)
Hemoglobin: 13.4 g/dL (ref 11.1–15.9)
MCH: 30 pg (ref 26.6–33.0)
MCHC: 33.4 g/dL (ref 31.5–35.7)
MCV: 90 fL (ref 79–97)
Platelets: 344 10*3/uL (ref 150–450)
RBC: 4.46 x10E6/uL (ref 3.77–5.28)
RDW: 12.1 % (ref 11.7–15.4)
WBC: 6.6 10*3/uL (ref 3.4–10.8)

## 2019-06-17 MED ORDER — METOPROLOL TARTRATE 25 MG PO TABS: 25.0000 mg | ORAL_TABLET | Freq: Two times a day (BID) | ORAL | 3 refills | Status: DC

## 2019-06-17 NOTE — Patient Instructions (Addendum)
After Visit Summary:  We will be checking the following labs today - BMET & CBC  Medication Instructions:    Continue with your current medicines.    If you need a refill on your cardiac medications before your next appointment, please call your pharmacy.     Testing/Procedures To Be Arranged:  N/A  Follow-Up:   Ok to cancel next week's visit with Daune Perch, NP  See Dr. Harrington Challenger later this month as planned  Let's try to get you to the Lipid Clinic on the same day you see Dr. Harrington Challenger.     At [facility], you and your health needs are our priority.  As part of our continuing mission to provide you with exceptional heart care, we have created designated Provider Care Teams.  These Care Teams include your primary Cardiologist (physician) and Advanced Practice Providers (APPs -  Physician Assistants and Nurse Practitioners) who all work together to provide you with the care you need, when you need it.  Special Instructions:  . Stay safe, stay home, wash your hands for at least 20 seconds and wear a mask when out in public.  . It was good to talk with you today.    Call the [place] office at [phone] if you have any questions, problems or concerns.

## 2019-06-22 ENCOUNTER — Other Ambulatory Visit: Payer: Self-pay | Admitting: Family Medicine

## 2019-06-26 ENCOUNTER — Ambulatory Visit: Payer: Medicare Other | Admitting: Cardiology

## 2019-06-30 ENCOUNTER — Telehealth (HOSPITAL_COMMUNITY): Payer: Self-pay

## 2019-06-30 ENCOUNTER — Encounter (HOSPITAL_COMMUNITY): Payer: Self-pay

## 2019-06-30 NOTE — Telephone Encounter (Signed)
Attempted to call patient in regards to Cardiac Rehab - LM on VM Mailed letter 

## 2019-07-01 ENCOUNTER — Other Ambulatory Visit: Payer: Self-pay

## 2019-07-01 ENCOUNTER — Ambulatory Visit (INDEPENDENT_AMBULATORY_CARE_PROVIDER_SITE_OTHER): Payer: Medicare Other | Admitting: Pharmacist

## 2019-07-01 ENCOUNTER — Telehealth: Payer: Self-pay

## 2019-07-01 VITALS — BP 134/70 | HR 58 | Ht 62.0 in | Wt 153.4 lb

## 2019-07-01 DIAGNOSIS — T466X5A Adverse effect of antihyperlipidemic and antiarteriosclerotic drugs, initial encounter: Secondary | ICD-10-CM | POA: Diagnosis not present

## 2019-07-01 DIAGNOSIS — G720 Drug-induced myopathy: Secondary | ICD-10-CM | POA: Diagnosis not present

## 2019-07-01 DIAGNOSIS — E782 Mixed hyperlipidemia: Secondary | ICD-10-CM | POA: Diagnosis not present

## 2019-07-01 MED ORDER — PRALUENT 75 MG/ML ~~LOC~~ SOAJ: 1.0000 "pen " | SUBCUTANEOUS | 11 refills | Status: DC

## 2019-07-01 MED ORDER — VASCEPA 1 G PO CAPS: 2.0000 | ORAL_CAPSULE | Freq: Two times a day (BID) | ORAL | 11 refills | Status: DC

## 2019-07-01 NOTE — Patient Instructions (Addendum)
Nice to see you today!  Keep up the good work with diet and exercise. Aim for a diet full of vegetables, fruit and lean meats (chicken, [place], fish). Try to limit carbs (bread, pasta, sugar, rice) and red meat in your diet. Great job with walking and biking daily.  Your goal LDL is < 55 mg/dL, you're currently at 128 mg/dL  Medication Changes: Begin injecting Praulent 75mg /mL once every other week (any day of the week that works for you) into the fatty skin of stomach, upper outer thigh or back of the arm. Clean the site with soap and warm water or an alcohol pad. Keep the medication in the fridge until you are ready to give your dose, then take it out and let warm up to room temperature for 30-60 mins.  Begin taking Vascepa 1g 2 capsules in the morning and 2 capsules in the evening.  Continue taking Zetia 10mg  daily and your provided CLEAR research study drug.  Stop taking the DHA and krill oil over-the-counter supplements.  Follow up in 2-3 months for fasting lab work (no food or drink for 8 hours prior to the lab visit).   Please give [place] a call at [phone] with any questions or concerns. You are welcome to come in on Monday Jul 06, 2019 before or after your appointment with Dr. Harrington Challenger for [place] to help with your first injection. Bring in 1 pen with you, leave the other in the fridge at home

## 2019-07-01 NOTE — Progress Notes (Signed)
Patient ID: [name]                 DOB: 1941-01-25                    MRN: [id number]     HPI: [name] is a 78 y.o. female patient of Dr. Harrington Challenger referred to lipid clinic by Truitt Merle, NP. PMH is significant for CAD, HTN and HLD. In April, she was admitted with chest pain and a chest CT showed extensive CAD. A cardiac CT revealed non obstructive disease. Then, she was admitted in September 2020 with an NSTEMI. She underwent cath and PCI to LCX. She has been enrolled in the CLEAR OUTCOMES trial since 2018. LDL trends from 220 (2018) > 202 (2019) > 134 (2020) and currently is 128 as of 06/01/19 on Zetia, OTC Omega 3s and research study drug.   Today patient presents to clinic in good spirits. She brought a folder of information on all medications, diet and BP log. She reports taking Zetia 10 mg daily, the CLEAR study drug and DHA + krill oil capsules OTC. She had intolerable myalgias with each statin that she tried. No side effects reported with current medications. She is eager to continue reduction of LDL to reduce risk of further CV events. Current insurance is [facility] [employer], but she plans to switch to [facility] at the beginning of next year.  Currently, she sees a nutritionist and reports weight loss down to 154 lbs from previous 182 over last couple years. She enjoys a breakfast of oatmeal or english muffin and an egg. For lunch she has salad or soup and at dinner she has a meat with vegetables. She has an air fryer that she likes to use. For snacks she eats mostly fresh fruit and fruit cups. She drinks water, diet coke and diet tea. She is trying to get into the cardiac rehab program, but walks 1 mi for 30 mins daily or rides her stationary bike 15 minutes two times a day for total of 30 minutes each day. Her goal is to be active for 30 minutes daily as tolerated.  Current Medications:  Zetia '10mg'$  daily CLEAR research study drug (started 11/01/16) DHA + Krill oil OTC  daily  Intolerances:  Rosuvastatin '5mg'$  (joint and muscle aches, sleep issues) Atorvastatin unknown dose (myalgias) Ezetimibe-simvastatin 10-'20mg'$  (myalgias) Lovaza 1g daily (unknown) Pitavastatin '2mg'$  daily (myalgias) Pravastatin '20mg'$  daily (myalgias)  Risk Factors: NSTEMI, CAD, family history LDL goal: < '55mg'$ /dL  Diet: Pt eats oatmeal or an english muffin with peanut butter and an egg for breakfast. She often eats a salad or light soup for lunch. For dinner she eats pork chops or a meat with vegetables (usually fresh or frozen, not canned). She has an air fryer that she likes to use. She snacks on apples, grapes or low sugar fruit cups. Pt drinks water, diet coke and diet tea.  Exercise: Pt is following up with cardiac rehab. Until she gets scheduled, she tries to exercise daily by either riding her stationary bike for 15 minutes 2x a day or walking 1 mi for 30 minutes.  Family History: CAD (mother), HLD (mother,sister),   Social History: Patient reports that she has never smoked or used smokeless tobacco. She reports current alcohol use, but no drug use.  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 Medication Name: 180 mg bempedoic acid vs a placebo, CLEAR Research Study drug provided     amLODipine (NORVASC) 2.5 MG tablet Take 1 tablet (2.5 mg total) by mouth daily. 90 tablet 3   aspirin EC 81 MG  tablet Take 81 mg by mouth daily.     Bimatoprost (LUMIGAN OP) Place 1 drop into both eyes at bedtime.      chlorthalidone (HYGROTON) 25 MG tablet TAKE (1/2) TABLET DAILY. 45 tablet 3   Cholecalciferol (VITAMIN D-3) 5000 UNITS TABS Take 5,000 mg by mouth daily.     dorzolamide-timolol (COSOPT) 22.3-6.8 MG/ML ophthalmic solution Place 1 drop into the left eye 2 (two) times daily.      ezetimibe (ZETIA) 10 MG tablet TAKE 1 TABLET EACH DAY. 90 tablet 0   famotidine (PEPCID) 20 MG tablet Take 1 tablet (20 mg total) by mouth 2 (two) times daily. 180 tablet 3   Krill Oil 1000 MG CAPS Take 1,000 mg by mouth daily. Mega Red      levothyroxine (SYNTHROID) 50 MCG tablet Take 1 tablet (50 mcg total) by mouth daily before breakfast. 90 tablet 3   metoprolol tartrate (LOPRESSOR) 25 MG tablet Take 1 tablet (25 mg total) by mouth 2 (two) times daily. 180 tablet 3   nitroGLYCERIN (NITROSTAT) 0.4 MG SL tablet Place 1 tablet (0.4 mg total) under the tongue every 5 (five) minutes x 3 doses as needed for chest pain. 25 tablet 12   Omega-3 Fatty Acids (OMEGA III EPA+DHA PO) Take 1 capsule by mouth daily.      pilocarpine (PILOCAR) 1 % ophthalmic solution Place 1 drop into the left eye 2 (two) times daily.     Polyethyl Glycol-Propyl Glycol (SYSTANE OP) Place 1-2 drops into both eyes as needed (for dry eyes).      Probiotic Product (PROBIOTIC DAILY) CAPS Take 1 capsule by mouth daily. Reported on 10/10/2015     ticagrelor (BRILINTA) 90 MG TABS tablet Take 1 tablet (90 mg total) by mouth 2 (two) times daily. 60 tablet 12   No current facility-administered medications on file prior to visit.     Allergies  Allergen Reactions   Adhesive [Tape] Other (See Comments)    Causes skin to turn red where touched   Rosuvastatin Other (See Comments)    Joint & muscle aches, sleep issues.    Assessment/Plan:  1. Hyperlipidemia - Patient remains above goal LDL < 55 mg/dL at 128 mg/dL on Zetia, CLEAR  OUTCOMES study drug and OTC DHA + krill oil. Given significant reduction from prior LDL in 2018 at 220 mg/dL, likely she is receiving the bempedoic acid rather than placebo. Further lowering is necessary to achieve goal especially in setting of recent NSTEMI. Will start Praluent '75mg'$ /mL SQ Q2W. Pt is agreeable to the new medication and plans to bring in her first dose to appointment with Dr. Harrington Challenger on Monday 07/06/19 so that pharmacy can help with  administration. Educated patient on injection technique, storage and cost of Praluent. Begin Vascepa 1g 2 capsules in the morning and 2 capsules in the evening for further risk reduction of CV events. She is agreeable to starting Vascepa and will discontinue DHA and kril oil OTC supplements. Patient verbalized understanding of each medication change. She plans to follow up with nutritionist and cardiac rehab for further diet and exercise goals. Will check follow up fasting lipid panel on 09/21/2019.   Pt seen with Judene Companion, P4 pharmacy student.  Megan E. Supple, PharmD, BCACP, [place] [address], [place], [place] [address] Phone: [phone]; Fax: [phone] 07/01/2019 11:17 AM    [employer]oc[employer] [id number] Site No: [id number] Subject ID No: g     Did subject meet all eligibility criteria?  Code  No Yes  46  Adults age ? 40 years '[]'$  '[x]'$   One or both of the following:   102 A  Hospitalization for a clinical ASCVD event: acute (MI), UA, IS, or CLI within 18 months of enrollment        Note: Subjects must have been admitted to the hospital. Those       who are admitted and discharged in less than 24 hours are      Eligible for the study.Subjects who have been admitted to     The ER for a clinical ASCVD. '[]'$  '[x]'$   102B Coronary or peripheral revascularization including percutaneous or surgical revascularization in the past 18 months '[]'$  '[x]'$   One of the following:  103 A  LDL ? 70 mg/dL (1.81 mmol/L) with no plans  for immediate initiation or titration of statin therapy '[]'$  '[x]'$   103 B Newly started on PCSK9i after the index hospitalization/procedure and prior to enrollment (but no more than 6 months prior to enrollment) with pre-PCSK9i treatment LDL-C value available and measured within 6 months of starting PCSK9i and known background LLT any time prior to PCSK9i initiation. '[x]'$  '[]'$   105 Planned follow-up within the health system '[]'$  '[x]'$   Subject does not meet any of the following exclusion criteria   201 Unable or unwilling to provide informed consent, including but not limited to cognitive or language barriers (reading or comprehension) '[x]'$  '[]'$   202 Lack of phone or email for contact '[x]'$  '[]'$   203 Evidence of end stage renal disease (ESRD) or stage 5 CKD '[x]'$  '[]'$   204 Anticipated life expectancy less than 6 month  '[x]'$  '[]'$   206  On a PCSK9i prior to their qualifying event '[x]'$  '[]'$      Subject Name: [name]  Subject met inclusion and exclusion criteria.  The informed consent form, study requirements and expectations were reviewed with the subject and questions and concerns were addressed prior to the signing of the consent form.  The subject verbalized understanding of the trial requirements.  The subject agreed to participate in the [place] trial and signed the informed consent at 10:30am on10/21/20.  The informed consent was obtained prior to performance of any protocol-specific procedures for the subject.  A copy of the signed informed consent was given to the subject and a copy was placed in the subject's medical record.   Megan Supple   Socioeconomic Status - Baseline  Education Some High School or Less High School graduate Some College/University Graduated College/University or above '[]'$  '[]'$  '[]'$  '[x]'$    Marital Status Married (widowed) Single Divorced Separated '[x]'$  '[]'$  '[]'$  '[]'$    Income <$15,000 $15,001 - $34,999 $35,000 - $74,999 $  75,000 - $99,999 >$100,000 '[]'$  '[]'$  '[]'$  '[x]'$  '[]'$

## 2019-07-01 NOTE — Telephone Encounter (Signed)
Called pt to switch to virtual on Monday 10/26 with ROSS. Left message asking pt to call the office.

## 2019-07-03 ENCOUNTER — Telehealth: Payer: Self-pay | Admitting: Pharmacist

## 2019-07-03 NOTE — Telephone Encounter (Signed)
Patients appointment with Dr. Harrington Challenger was changed to vitrual for Monday. She was supposed to come to CVRR clinic for help with first PCSK9i injection. Advised she can still come to office for help on Monday. Virutal visit is at 8:00. She will plan to come around 9:30 to office.

## 2019-07-06 ENCOUNTER — Telehealth (INDEPENDENT_AMBULATORY_CARE_PROVIDER_SITE_OTHER): Payer: Medicare Other | Admitting: Internal Medicine

## 2019-07-06 ENCOUNTER — Other Ambulatory Visit: Payer: Self-pay

## 2019-07-06 ENCOUNTER — Telehealth: Payer: Self-pay | Admitting: Pharmacist

## 2019-07-06 DIAGNOSIS — I2510 Atherosclerotic heart disease of native coronary artery without angina pectoris: Secondary | ICD-10-CM | POA: Diagnosis not present

## 2019-07-06 DIAGNOSIS — I10 Essential (primary) hypertension: Secondary | ICD-10-CM | POA: Diagnosis not present

## 2019-07-06 DIAGNOSIS — E785 Hyperlipidemia, unspecified: Secondary | ICD-10-CM | POA: Diagnosis not present

## 2019-07-06 NOTE — Patient Instructions (Signed)
Medication Instructions:  Your physician recommends that you continue on your current medications as directed. Please refer to the Current Medication list given to you today.  *If you need a refill on your cardiac medications before your next appointment, please call your pharmacy*  Lab Work: None Ordered   Testing/Procedures: None Ordered   Follow-Up: At [facility], you and your health needs are our priority.  As part of our continuing mission to provide you with exceptional heart care, we have created designated Provider Care Teams.  These Care Teams include your primary Cardiologist (physician) and Advanced Practice Providers (APPs -  Physician Assistants and Nurse Practitioners) who all work together to provide you with the care you need, when you need it.  Your next appointment:   3 months  The format for your next appointment:   In Person  Provider:   You may see Dorris Carnes, MD or one of the following Advanced Practice Providers on your designated Care Team:    Richardson Dopp, PA-C  Vin Blen[place], [place]  Daune Perch, [place]

## 2019-07-06 NOTE — Telephone Encounter (Signed)
Patient presents to clinic for her first injection of Praluent 75mg . Patient successfully injected Praluent 75mg /ml into her right abdomen.

## 2019-07-06 NOTE — Progress Notes (Signed)
Virtual Visit via Telephone Note   This visit type was conducted due to national recommendations for restrictions regarding the COVID-19 Pandemic (e.g. social distancing) in an effort to limit this patient's exposure and mitigate transmission in our community.  Due to her co-morbid illnesses, this patient is at least at moderate risk for complications without adequate follow up.  This format is felt to be most appropriate for this patient at this time.  The patient did not have access to video technology/had technical difficulties with video requiring transitioning to audio format only (telephone).  All issues noted in this document were discussed and addressed.  No physical exam could be performed with this format.  Please refer to the patient's chart for her  consent to telehealth for [facility].   Date:  07/06/2019   ID:  [name], DOB 1941-06-28, MRN [id number]  Patient Location: Home Provider Location: Home  PCP:  Marin Olp, MD  Cardiologist:  Dorris Carnes, MD  Electrophysiologist:  None   Evaluation Performed:  Follow-Up Visit  Chief Complaint:  F/U of CAD   History of Present Illness:    [name] is a 78 y.o. female who presents today for a 5 month check Seen for Dr. Harrington Challenger.   She has a history of breast cancer (s/p mastectomy). Other issues include HTN and profound HLD and has had some statin intolerance - she is in the CLEAR trial.   Admitted back in April with chest pain - CT of chest/abd with extensive atherosclerosis. Cardiac CT with non obstructive disease noted.   Then admitted in Sept 2020 with NSTEMI - underwent cath and had PCI to the [facility] -  now on DAPT. Norvasc was also started for better BP control.   Seen by Tommas Olp earlier this fall  She has also been seen by Sundra Aland  Plan to start Vescepa and PCSK9 inhibitor Rx    The pt deines CP   Says she gets tired at times   BUt is very active   Biking 30 min per day   Wants to enter  cardiac rehab.  Seeing a nutritionist.   The patient does not have symptoms concerning for COVID-19 infection (fever, chills, cough, or new shortness of breath).    Past Medical History:  Diagnosis Date  . Abnormal thyroid function test 07/01/2013  . Anxiety   . Arthritis   . Cancer ([place])   . Dermatitis 11/25/2013  . Glaucoma 11/02/2012  . HTN (hypertension) 11/02/2012  . 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 Hyperlipidemia   . Hypothyroid   . Obesity    Past Surgical History:  Procedure Laterality Date  . AUGMENTATION MAMMAPLASTY Left    05/2006  . BREAST SURGERY  10/2005   Left Mastectomy, reconstruction, reduction on right. Dr. Harlow Mares  . COLONOSCOPY    . CORONARY STENT INTERVENTION N/A 06/01/2019   Procedure: CORONARY STENT INTERVENTION;  Surgeon: Sherren Mocha, MD;  Location: [place] [facility];  Service: Cardiovascular;  Laterality: N/A;  . CRYOTHERAPY     GYN for CIN III  . DILATION AND CURETTAGE OF UTERUS    . EXCISION MORTON'S NEUROMA     right foot  . EYE SURGERY Right 05/08/2018  . LEFT HEART CATH AND CORONARY ANGIOGRAPHY N/A 06/01/2019   Procedure: LEFT HEART CATH AND CORONARY ANGIOGRAPHY;  Surgeon: Sherren Mocha, MD;  Location: [place] [facility];  Service: Cardiovascular;  Laterality: N/A;  .  REDUCTION MAMMAPLASTY Right      Current Meds  Medication Sig  . Alirocumab (PRALUENT) 75 MG/ML SOAJ Inject 1 pen into the skin every 14 (fourteen) days.  . AMBULATORY NON FORMULARY MEDICATION Take 180 mg by mouth daily. Medication Name: 180 mg bempedoic acid vs a placebo, CLEAR Research Study drug provided  . amLODipine (NORVASC) 2.5 MG tablet Take 1 tablet (2.5 mg total) by mouth daily.  Marland Kitchen aspirin EC 81 MG tablet Take 81 mg by mouth daily.  . Bimatoprost (LUMIGAN OP) Place 1 drop into both eyes at bedtime.   . chlorthalidone (HYGROTON) 25 MG tablet TAKE (1/2) TABLET DAILY.  Marland Kitchen Cholecalciferol (VITAMIN D-3) 5000 UNITS TABS Take  5,000 mg by mouth daily.  . dorzolamide-timolol (COSOPT) 22.3-6.8 MG/ML ophthalmic solution Place 1 drop into the left eye 2 (two) times daily.   Marland Kitchen ezetimibe (ZETIA) 10 MG tablet TAKE 1 TABLET EACH DAY.  . famotidine (PEPCID) 20 MG tablet Take 1 tablet (20 mg total) by mouth 2 (two) times daily.  Vanessa Kick Ethyl (VASCEPA) 1 g CAPS Take 2 capsules (2 g total) by mouth 2 (two) times daily.  Marland Kitchen levothyroxine (SYNTHROID) 50 MCG tablet Take 1 tablet (50 mcg total) by mouth daily before breakfast.  . metoprolol tartrate (LOPRESSOR) 25 MG tablet Take 1 tablet (25 mg total) by mouth 2 (two) times daily.  . nitroGLYCERIN (NITROSTAT) 0.4 MG SL tablet Place 1 tablet (0.4 mg total) under the tongue every 5 (five) minutes x 3 doses as needed for chest pain.  . pilocarpine (PILOCAR) 1 % ophthalmic solution Place 1 drop into the left eye 2 (two) times daily.  Vladimir Faster Glycol-Propyl Glycol (SYSTANE OP) Place 1-2 drops into both eyes as needed (for dry eyes).   . Probiotic Product (PROBIOTIC DAILY) CAPS Take 1 capsule by mouth daily. Reported on 10/10/2015  . ticagrelor (BRILINTA) 90 MG TABS tablet Take 1 tablet (90 mg total) by mouth 2 (two) times daily.     Allergies:   Adhesive [tape] and Rosuvastatin   Social History   Tobacco Use  . Smoking status: Never Smoker  . Smokeless tobacco: Never Used  Substance Use Topics  . Alcohol use: Yes    Comment: Rare  . Drug use: No     Family Hx: The patient's family history includes CAD in her mother; Colon cancer in her paternal grandmother; Hyperlipidemia in her mother and sister; Other in her father; Parkinsonism in her mother; Pneumonia in her mother. There is no history of Prostate cancer, Breast cancer, Heart disease, or Diabetes.  ROS:   Please see the history of present illness.    All other systems reviewed and are negative.   Prior CV studies:   The following studies were reviewed today: Other Studies Reviewed Today:  CORONARY STENT  INTERVENTION 05/2019  LEFT HEART CATH AND CORONARY ANGIOGRAPHY  Conclusion  1) Severe single vessel CAD with critical stenosis of the mid-circumflex, treated successfully with PTCA and stenting (2.5x12 mm Synergy DES 2) nonobstructive LAD stenosis 3) widely patent RCA, dominant vessel 4) normal LV systolic function  Recommend: DAPT with ASA and ticagrelor x 12 months without interruption. Pt loaded with ticagrelor 180 mg on the cath lab table. DC home tomorrow am if no complications arise.      Labs/Other Tests and Data Reviewed:    EKG:  No ECG reviewed.  Recent Labs: 12/30/2018: Magnesium 2.3 04/29/2019: ALT 17; TSH 1.13 06/17/2019: BUN 22; Creatinine, Ser 0.95; Hemoglobin 13.4; Platelets 344; Potassium  3.9; Sodium 141   Recent Lipid Panel Lab Results  Component Value Date/Time   CHOL 198 06/01/2019 05:41 AM   TRIG 98 06/01/2019 05:41 AM   HDL 50 06/01/2019 05:41 AM   CHOLHDL 4.0 06/01/2019 05:41 AM   LDLCALC 128 (H) 06/01/2019 05:41 AM   LDLDIRECT 114.0 04/29/2019 11:02 AM    Wt Readings from Last 3 Encounters:  07/06/19 154 lb (69.9 kg)  07/01/19 153 lb 6 oz (69.6 kg)  06/17/19 154 lb 12.8 oz (70.2 kg)     Objective:    Vital Signs:  BP 128/68 (BP Location: Right Wrist, Patient Position: Sitting, Cuff Size: Normal)   Pulse 63   Ht 5\' 2"  (1.575 m)   Wt 154 lb (69.9 kg)   BMI 28.17 kg/m    VITAL SIGNS:  reviewed  ASSESSMENT & PLAN:    1. CAD  Doing well post intervention   WIll go ahead and work to getting her into cardiac rehab.  2  HTN    BP is good at home  She will bring in list of bp's that she has taken.     3   HL  Pt will come in today for instruction on injections   Has started Vascepa   Will need to follow up with lipids   I will set to see pt in Feb in clinic  COVID-19 Education: The signs and symptoms of COVID-19 were discussed with the patient and how to seek care for testing (follow up with PCP or arrange E-visit).  The importance of social  distancing was discussed today.  Time:   Today, I have spent 20 minutes with the patient with telehealth technology discussing the above problems.     Medication Adjustments/Labs and Tests Ordered: Current medicines are reviewed at length with the patient today.  Concerns regarding medicines are outlined above.   Tests Ordered: No orders of the defined types were placed in this encounter.   Medication Changes: No orders of the defined types were placed in this encounter.   Follow Up:  F/U in Feb 2021  Signed, Dorris Carnes, MD  07/06/2019 8:09 AM    [place]

## 2019-07-12 ENCOUNTER — Telehealth: Payer: Self-pay | Admitting: Internal Medicine

## 2019-07-12 NOTE — Telephone Encounter (Signed)
Pt dropped off BP readings Most recent BP recordings are pretty good overall   Keep on same meds

## 2019-07-13 NOTE — Telephone Encounter (Signed)
Per DPR left message on VM at home that Dr. Harrington Challenger reviewed BPs and overall pretty good and to keep on same medicines.

## 2019-07-15 ENCOUNTER — Telehealth (HOSPITAL_COMMUNITY): Payer: Self-pay

## 2019-07-15 NOTE — Telephone Encounter (Signed)
No response from pt regarding CR.  Closed referral.  

## 2019-07-27 ENCOUNTER — Ambulatory Visit (INDEPENDENT_AMBULATORY_CARE_PROVIDER_SITE_OTHER): Payer: Medicare Other | Admitting: Family Medicine

## 2019-07-27 ENCOUNTER — Other Ambulatory Visit: Payer: Self-pay

## 2019-07-27 ENCOUNTER — Encounter: Payer: Self-pay | Admitting: Family Medicine

## 2019-07-27 VITALS — BP 140/70 | HR 70 | Temp 97.9°F | Ht 62.0 in | Wt 151.2 lb

## 2019-07-27 DIAGNOSIS — E782 Mixed hyperlipidemia: Secondary | ICD-10-CM

## 2019-07-27 DIAGNOSIS — I10 Essential (primary) hypertension: Secondary | ICD-10-CM | POA: Diagnosis not present

## 2019-07-27 DIAGNOSIS — E039 Hypothyroidism, unspecified: Secondary | ICD-10-CM | POA: Diagnosis not present

## 2019-07-27 DIAGNOSIS — I2510 Atherosclerotic heart disease of native coronary artery without angina pectoris: Secondary | ICD-10-CM | POA: Diagnosis not present

## 2019-07-27 MED ORDER — CHLORTHALIDONE 25 MG PO TABS: ORAL_TABLET | ORAL | 3 refills | Status: DC

## 2019-07-27 NOTE — Patient Instructions (Addendum)
Blood pressure on home #s appears to be <140/90 but you are not having many lows and you are having several #s in 140s- we will see if we can shift this down slightly by increasing chlorthalidone to full tablet. We considered increasing amlodipine to 5 mg but you had swelling issues in the past. See me back in 1 month to repeat bloodwork and recheck blood pressure  Also check cholesterol at follow up   Great to see you today and thanks for the kind gift in memory of your amazing husband

## 2019-07-27 NOTE — Progress Notes (Signed)
Phone [phone] In person visit   Subjective:   [name] is a 78 y.o. year old very pleasant female patient who presents for/with See problem oriented charting Chief Complaint  Patient presents with  . Follow-up  . Hypertension  . Hypothyroidism   ROS-varicose veins noted.  No regular edema.  No fever or chills.  No reported chest pain since hospitalization  Past Medical History-  Patient Active Problem List   Diagnosis Date Noted  . CAD (coronary artery disease) s/p stent mCx 05/2019 06/02/2019    Priority: High  . Grieving 02/18/2017    Priority: High  . Osteoporosis 11/05/2016    Priority: High  . Chest pain 12/29/2018    Priority: Medium  . Primary open-angle glaucoma 11/20/2017    Priority: Medium  . Hypothyroidism 07/01/2013    Priority: Medium  . Hypertension 11/02/2012    Priority: Medium  . Hyperlipidemia 07/24/2011    Priority: Medium  . 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 History of adenomatous polyp of colon 09/30/2018    Priority: Low  . Medicare annual wellness visit, subsequent 09/26/2014    Priority: Low  . Vitamin D deficiency 11/02/2012    Priority: Low  . Obesity (BMI 30-39.9) 07/24/2011    Priority: Low  . Statin myopathy 07/01/2019  . Hypokalemia 06/02/2019  . NSTEMI (non-ST elevated myocardial infarction) ([place]) 05/31/2019  . Pseudophakia of both eyes 11/20/2017    Medications- reviewed and updated Current Outpatient Medications  Medication Sig Dispense Refill  . Alirocumab (PRALUENT) 75 MG/ML SOAJ Inject 1 pen into the skin every 14 (fourteen) days. 2 pen 11  . AMBULATORY NON FORMULARY MEDICATION Take 180 mg by mouth daily. Medication Name: 180 mg bempedoic acid vs a placebo, CLEAR Research Study drug provided    . amLODipine (NORVASC) 2.5 MG tablet Take 1 tablet (2.5 mg total) by mouth daily. 90 tablet 3  . aspirin EC 81 MG tablet Take 81 mg by mouth daily.    .  Bimatoprost (LUMIGAN OP) Place 1 drop into both eyes at bedtime.     . chlorthalidone (HYGROTON) 25 MG tablet TAKE 1 TABLET DAILY. 90 tablet 3  . Cholecalciferol (VITAMIN D-3) 5000 UNITS TABS Take 5,000 mg by mouth daily.    . dorzolamide-timolol (COSOPT) 22.3-6.8 MG/ML ophthalmic solution Place 1 drop into the left eye 2 (two) times daily.     Marland Kitchen ezetimibe (ZETIA) 10 MG tablet TAKE 1 TABLET EACH DAY. 90 tablet 0  . famotidine (PEPCID) 20 MG tablet Take 1 tablet (20 mg total) by mouth 2 (two) times daily. 180 tablet 3  . Icosapent Ethyl (VASCEPA) 1 g CAPS Take 2 capsules (2 g total) by mouth 2 (two) times daily. 120 capsule 11  . levothyroxine (SYNTHROID) 50 MCG tablet Take 1 tablet (50 mcg total) by mouth daily before breakfast. 90 tablet 3  . metoprolol tartrate (LOPRESSOR) 25 MG tablet Take 1 tablet (25 mg total) by mouth 2 (two) times daily. 180 tablet 3  . nitroGLYCERIN (NITROSTAT) 0.4 MG SL tablet Place 1 tablet (0.4 mg total) under the tongue every 5 (five) minutes x 3 doses as needed for chest pain. 25 tablet 12  . pilocarpine (PILOCAR) 1 % ophthalmic solution Place 1 drop into the left eye 2 (two) times daily.    Vladimir Faster Glycol-Propyl Glycol (SYSTANE OP) Place 1-2 drops into both eyes as needed (for dry eyes).     Marland Kitchen  Probiotic Product (PROBIOTIC DAILY) CAPS Take 1 capsule by mouth daily. Reported on 10/10/2015    . ticagrelor (BRILINTA) 90 MG TABS tablet Take 1 tablet (90 mg total) by mouth 2 (two) times daily. 60 tablet 12   No current facility-administered medications for this visit.      Objective:  BP 140/70   Pulse 70   Temp 97.9 F (36.6 C)   Ht 5\' 2"  (1.575 m)   Wt 151 lb 3.2 oz (68.6 kg)   SpO2 95%   BMI 27.65 kg/m  Gen: NAD, resting comfortably CV: RRR no murmurs rubs or gallops Lungs: CTAB no crackles, wheeze, rhonchi Abdomen: soft/nontender/nondistended/normal bowel sounds.   Ext: no edema Skin: warm, dry  Home med list: praluent every 2 weeks as well        Assessment and Plan   #hypertension S: compliant with half tablet of chlorthalidone 25 mg and metoprolol 25 mg twice a day as well as Norvasc 5 mg (she is concerned about increasing Norvasc further due to of prior swelling issues).  Home readings remain well controlled overall but with several numbers into the 140s or 150s and no numbers below 120 with average likely high 130s and with controlled diastolic.  Tries to moderate salt intake.  Does eat out more since husband passed away.   Has also lost 32 lbs working with nutrionist from [place]. Stationary bike-also trying to use  Uses wrist cuff at home but has appeared accurate BP Readings from Last 3 Encounters:  07/27/19 140/70  07/06/19 128/68  07/01/19 134/70  A/P: poor control in office today and high normal systolic readings at home- from avs"Blood pressure on home #s appears to be <140/90 but you are not having many lows and you are having several #s in 140s- we will see if we can shift this down slightly by increasing chlorthalidone to full tablet. We considered increasing amlodipine to 5 mg but you had swelling issues in the past. See me back in 1 month to repeat bloodwork and recheck blood pressure "  #CAD/hyperlipidemia  S: Patient was in a research trial for cholesterol medication.  Her numbers came down significantly during the trial on bempedoic acid-she was also on Zetia.  Unfortunately patient had an NSTEMI in September therefore lipid goals had to be intensified. had heart catheterization with Dr. Sherren Mocha and found to have single-vessel CAD with critical stenosis of mid circumflex treated with stent.  She was recommended to be on aspirin and ticagrelor for 12 months. She was started on Praluent every other week as well as Vascepa without other medications changes.  Patient denies any current symptoms  Lab Results  Component Value Date   CHOL 198 06/01/2019   HDL 50 06/01/2019   LDLCALC 128 (H) 06/01/2019   LDLDIRECT  114.0 04/29/2019   TRIG 98 06/01/2019   CHOLHDL 4.0 06/01/2019   A/P: CAD status post stent in circumflex on ticagrelor and aspirin for 1 year.  Also on intense regimen for lipids including bempedoic, Zetia, Praluent, Vascepa-we plan to repeat lipid panel next month and would likely ask lipid clinic to weigh in at that time.   #hypothyroidism S: compliant On thyroid medication-synthroid 61mcg Lab Results  Component Value Date   TSH 1.13 04/29/2019  A/P: Stable. Continue current medications.  Update TSH at follow-up  Recommended follow up: 1 month follow-up Future Appointments  Date Time Provider [place]  09/02/2019  8:00 AM Marin Olp, MD [facility]  09/21/2019  9:00  AM [facility]  10/12/2019  9:00 AM Fay Records, MD [facility]  12/07/2019  9:00 AM [place] [facility] None   Lab/Order associations:   ICD-10-CM   1. Essential hypertension  I10   2. Coronary artery disease involving native coronary artery of native heart without angina pectoris  I25.10   3. Mixed hyperlipidemia  E78.2   4. Hypothyroidism, unspecified type  E03.9    Meds ordered this encounter  Medications  . chlorthalidone (HYGROTON) 25 MG tablet    Sig: TAKE 1 TABLET DAILY.    Dispense:  90 tablet    Refill:  3   Return precautions advised.  Garret Reddish, MD

## 2019-07-27 NOTE — Assessment & Plan Note (Signed)
#  CAD/hyperlipidemia  S: Patient was in a research trial for cholesterol medication.  Her numbers came down significantly during the trial on bempedoic acid-she was also on Zetia.  Unfortunately patient had an NSTEMI in September therefore lipid goals had to be intensified. had heart catheterization with Dr. Sherren Mocha and found to have single-vessel CAD with critical stenosis of mid circumflex treated with stent.  She was recommended to be on aspirin and ticagrelor for 12 months. She was started on Praluent every other week as well as Vascepa without other medications changes.  Patient denies any current symptoms  Lab Results  Component Value Date   CHOL 198 06/01/2019   HDL 50 06/01/2019   LDLCALC 128 (H) 06/01/2019   LDLDIRECT 114.0 04/29/2019   TRIG 98 06/01/2019   CHOLHDL 4.0 06/01/2019   A/P: CAD status post stent in circumflex on ticagrelor and aspirin for 1 year.  Also on intense regimen for lipids including bempedoic, Zetia, Praluent, Vascepa-we plan to repeat lipid panel next month and would likely ask lipid clinic to weigh in at that time.

## 2019-08-31 ENCOUNTER — Ambulatory Visit: Payer: Medicare Other | Admitting: Family Medicine

## 2019-09-01 ENCOUNTER — Telehealth (HOSPITAL_COMMUNITY): Payer: Self-pay

## 2019-09-01 ENCOUNTER — Other Ambulatory Visit: Payer: Self-pay

## 2019-09-01 ENCOUNTER — Encounter (HOSPITAL_COMMUNITY): Payer: Self-pay

## 2019-09-01 NOTE — Telephone Encounter (Signed)
Called to advise pt that given the surge/increase in the Covid-19 cases and hospitalizations, our [facility] senior leadership has asked [place] to halt onsite Cardiac and Pulmonary rehab exercise sessions and move patients to the Virtual app we have with [employer]. The reason for this is so that department staff can be deployed to the inpatient nursing floors to assist those teams in need. Leadership anticipates this need will be 30-60 days however, it could be extended if needed. Advised pt to attend the Orientation assessment appt for 09/17/2019. At that time 10:00 will receive additional information about our [employer]. Pt verbalized understanding and is in agreement.

## 2019-09-02 ENCOUNTER — Encounter: Payer: Self-pay | Admitting: Family Medicine

## 2019-09-02 ENCOUNTER — Ambulatory Visit (INDEPENDENT_AMBULATORY_CARE_PROVIDER_SITE_OTHER): Payer: Medicare Other | Admitting: Family Medicine

## 2019-09-02 VITALS — BP 118/60 | HR 72 | Temp 97.2°F | Ht 62.0 in | Wt 150.0 lb

## 2019-09-02 DIAGNOSIS — I10 Essential (primary) hypertension: Secondary | ICD-10-CM

## 2019-09-02 DIAGNOSIS — E785 Hyperlipidemia, unspecified: Secondary | ICD-10-CM | POA: Diagnosis not present

## 2019-09-02 DIAGNOSIS — E039 Hypothyroidism, unspecified: Secondary | ICD-10-CM

## 2019-09-02 LAB — COMPREHENSIVE METABOLIC PANEL WITH GFR
ALT: 23 U/L (ref 0–35)
AST: 19 U/L (ref 0–37)
Albumin: 4.5 g/dL (ref 3.5–5.2)
Alkaline Phosphatase: 43 U/L (ref 39–117)
BUN: 23 mg/dL (ref 6–23)
CO2: 29 meq/L (ref 19–32)
Calcium: 9.7 mg/dL (ref 8.4–10.5)
Chloride: 101 meq/L (ref 96–112)
Creatinine, Ser: 0.89 mg/dL (ref 0.40–1.20)
GFR: 61.33 mL/min
Glucose, Bld: 97 mg/dL (ref 70–99)
Potassium: 3.1 meq/L — ABNORMAL LOW (ref 3.5–5.1)
Sodium: 142 meq/L (ref 135–145)
Total Bilirubin: 0.9 mg/dL (ref 0.2–1.2)
Total Protein: 6.4 g/dL (ref 6.0–8.3)

## 2019-09-02 LAB — TSH: TSH: 1.18 u[IU]/mL (ref 0.35–4.50)

## 2019-09-02 LAB — LIPID PANEL
Cholesterol: 105 mg/dL (ref 0–200)
HDL: 52.6 mg/dL
LDL Cholesterol: 31 mg/dL (ref 0–99)
NonHDL: 52.82
Total CHOL/HDL Ratio: 2
Triglycerides: 109 mg/dL (ref 0.0–149.0)
VLDL: 21.8 mg/dL (ref 0.0–40.0)

## 2019-09-02 MED ORDER — POTASSIUM CHLORIDE CRYS ER 20 MEQ PO TBCR: 20.0000 meq | EXTENDED_RELEASE_TABLET | Freq: Every day | ORAL | 3 refills | Status: DC | PRN

## 2019-09-02 NOTE — Progress Notes (Signed)
Phone [phone] In person visit   Subjective:   [name] is a 78 y.o. year old very pleasant female patient who presents for/with See problem oriented charting Chief Complaint  Patient presents with  . Follow-up  . Hypertension   ROS- some vision loss in right eye- working with optho. No chest pain or shortness of breath or edema.    This visit occurred during the SARS-CoV-2 public health emergency.  Safety protocols were in place, including screening questions prior to the visit, additional usage of staff PPE, and extensive cleaning of exam room while observing appropriate contact time as indicated for disinfecting solutions.   Past Medical History-  Patient Active Problem List   Diagnosis Date Noted  . CAD (coronary artery disease) s/p stent mCx 05/2019 06/02/2019    Priority: High  . Grieving 02/18/2017    Priority: High  . Osteoporosis 11/05/2016    Priority: High  . Chest pain 12/29/2018    Priority: Medium  . Primary open-angle glaucoma 11/20/2017    Priority: Medium  . Hypothyroidism 07/01/2013    Priority: Medium  . Hypertension 11/02/2012    Priority: Medium  . Hyperlipidemia 07/24/2011    Priority: Medium  . 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 History of adenomatous polyp of colon 09/30/2018    Priority: Low  . Medicare annual wellness visit, subsequent 09/26/2014    Priority: Low  . Vitamin D deficiency 11/02/2012    Priority: Low  . Obesity (BMI 30-39.9) 07/24/2011    Priority: Low  . Statin myopathy 07/01/2019  . Hypokalemia 06/02/2019  . NSTEMI (non-ST elevated myocardial infarction) ([place]) 05/31/2019  . Pseudophakia of both eyes 11/20/2017    Medications- reviewed and updated Current Outpatient Medications  Medication Sig Dispense Refill  . Alirocumab (PRALUENT) 75 MG/ML SOAJ Inject 1 pen into the skin every 14 (fourteen) days. 2 pen 11  . AMBULATORY NON FORMULARY MEDICATION  Take 180 mg by mouth daily. Medication Name: 180 mg bempedoic acid vs a placebo, CLEAR Research Study drug provided    . amLODipine (NORVASC) 2.5 MG tablet Take 1 tablet (2.5 mg total) by mouth daily. 90 tablet 3  . aspirin EC 81 MG tablet Take 81 mg by mouth daily.    . Bimatoprost (LUMIGAN OP) Place 1 drop into both eyes at bedtime.     . chlorthalidone (HYGROTON) 25 MG tablet TAKE 1 TABLET DAILY. 90 tablet 3  . Cholecalciferol (VITAMIN D-3) 5000 UNITS TABS Take 5,000 mg by mouth daily.    . dorzolamide-timolol (COSOPT) 22.3-6.8 MG/ML ophthalmic solution Place 1 drop into the left eye 2 (two) times daily.     Marland Kitchen ezetimibe (ZETIA) 10 MG tablet TAKE 1 TABLET EACH DAY. 90 tablet 0  . famotidine (PEPCID) 20 MG tablet Take 1 tablet (20 mg total) by mouth 2 (two) times daily. 180 tablet 3  . Icosapent Ethyl (VASCEPA) 1 g CAPS Take 2 capsules (2 g total) by mouth 2 (two) times daily. 120 capsule 11  . levothyroxine (SYNTHROID) 50 MCG tablet Take 1 tablet (50 mcg total) by mouth daily before breakfast. 90 tablet 3  . metoprolol tartrate (LOPRESSOR) 25 MG tablet Take 1 tablet (25 mg total) by mouth 2 (two) times daily. 180 tablet 3  . nitroGLYCERIN (NITROSTAT) 0.4 MG SL tablet Place 1 tablet (0.4 mg total) under the tongue every 5 (five) minutes x 3 doses as needed for chest pain. 25  tablet 12  . pilocarpine (PILOCAR) 1 % ophthalmic solution Place 1 drop into the left eye 2 (two) times daily.    Vladimir Faster Glycol-Propyl Glycol (SYSTANE OP) Place 1-2 drops into both eyes as needed (for dry eyes).     . Probiotic Product (PROBIOTIC DAILY) CAPS Take 1 capsule by mouth daily. Reported on 10/10/2015    . ticagrelor (BRILINTA) 90 MG TABS tablet Take 1 tablet (90 mg total) by mouth 2 (two) times daily. 60 tablet 12   No current facility-administered medications for this visit.     Objective:  BP 118/60   Pulse 72   Temp (!) 97.2 F (36.2 C)   Ht 5\' 2"  (1.575 m)   Wt 150 lb (68 kg)   SpO2 98%   BMI  27.44 kg/m  Gen: NAD, resting comfortably CV: RRR no murmurs rubs or gallops Lungs: CTAB no crackles, wheeze, rhonchi Ext: no edema Skin: warm, dry Neuro: grossly normal, moves all extremities     Assessment and Plan   #hypertension S: compliant with chlorthalidone 25 mg (incrased from 12.5 mg last visit) metoprolol 25 mg twice a day as well as amlodipine 2.5 mg (she does not want to increase norvasc further due to edema concerns).   Average since last visit 132.5/71.7 BP Readings from Last 3 Encounters:  09/02/19 118/60  07/27/19 140/70  07/06/19 128/68  A/P: excellent control. Is going to the bathroom more with full chlorthalidone tablet (shes drinking more water too). I think with how good BP looks we should continue current dose  - still working with nutrionist- down from 182 years ago with peak with nutrionist to 148   #hyperlipidemia/CAD S: compliant with Brilinta, aspirin with plan on being on these for 1 year. She denies any recent chest pain. For her lipids she is on clear study, Zetia, Praluent, Vascepa. No chest pain or shortness of breath   Prior to praluent Lab Results  Component Value Date   CHOL 198 06/01/2019   HDL 50 06/01/2019   LDLCALC 128 (H) 06/01/2019   LDLDIRECT 114.0 04/29/2019   TRIG 98 06/01/2019   CHOLHDL 4.0 06/01/2019    A/P: Poor control last check-update lipid panel today. CAD asymptomatic.  Continue current symptoms - cardiac rehab pushed back due to covid- may be able to some online  #hypothyroidism S: compliant On thyroid medication-Synthroid 50 mcg Lab Results  Component Value Date   TSH 1.13 04/29/2019   A/P: Hopefully controlled-update TSH today    Recommended follow up: Return in about 3 months (around 12/01/2019). Future Appointments  Date Time Provider [place]  09/17/2019 10:00 AM [facility] None  09/21/2019  9:00 AM [facility]  10/12/2019  9:00 AM Fay Records, MD  [facility]  12/07/2019  9:00 AM [place] [facility] None    Lab/Order associations:   ICD-10-CM   1. Hypothyroidism, unspecified type  E03.9 TSH  2. Hyperlipidemia, unspecified hyperlipidemia type  E78.5 Comprehensive metabolic panel    Lipid panel  3. Essential hypertension  I10 Comprehensive metabolic panel    No orders of the defined types were placed in this encounter.   Return precautions advised.  Garret Reddish, MD

## 2019-09-02 NOTE — Patient Instructions (Addendum)
Please stop by lab before you go If you do not have mychart- we will call you about results within 5 business days of [place] receiving them.  If you have mychart- we will send your results within 3 business days of [place] receiving them.  If abnormal or we want to clarify a result, we will call or mychart you to make sure you receive the message.  If you have questions or concerns or don't hear within 5-7 days, please send [place] a message or call us.   Merry Christmas! Glad you are doing so well!   Recommended follow up: Return in about 3 months (around 12/01/2019).

## 2019-09-02 NOTE — Addendum Note (Signed)
Addended by: Marin Olp on: 09/02/2019 02:51 PM   Modules accepted: Orders

## 2019-09-16 ENCOUNTER — Other Ambulatory Visit: Payer: Self-pay

## 2019-09-16 ENCOUNTER — Telehealth (HOSPITAL_COMMUNITY): Payer: Self-pay

## 2019-09-16 ENCOUNTER — Encounter (HOSPITAL_COMMUNITY)
Admission: RE | Admit: 2019-09-16 | Discharge: 2019-09-16 | Disposition: A | Discharge status: Home / Self Care | Payer: Self-pay | Source: Ambulatory Visit | Attending: Internal Medicine | Admitting: Internal Medicine

## 2019-09-16 DIAGNOSIS — Z955 Presence of coronary angioplasty implant and graft: Secondary | ICD-10-CM | POA: Insufficient documentation

## 2019-09-16 DIAGNOSIS — I214 Non-ST elevation (NSTEMI) myocardial infarction: Secondary | ICD-10-CM | POA: Insufficient documentation

## 2019-09-16 NOTE — Progress Notes (Signed)
Successful telephone encounter to [name] to confirm Cardiac Rehab orientation appointment for 09/16/2018 at 1000. Nursing assessment completed. Instructions for appointment provided. Patient screening for Covid-19 negative.

## 2019-09-16 NOTE — Telephone Encounter (Signed)
Cardiac Rehab Medication Review by a Pharmacist  Does the patient  feel that his/her medications are working for him/her?  Yes  Has the patient been experiencing any side effects to the medications prescribed?  No  Does the patient measure his/her own blood pressure?  Yes, she checks her blood pressure everyday and keeps a daily log.   Does the patient have any problems obtaining medications due to transportation or finances?   No  Understanding of regimen: excellent Understanding of indications: excellent Potential of compliance: excellent  Pharmacist comments: Super interest in receiving the COVID vaccine when available   [name], PharmD  PGY1 [place] Resident [phone] 09/16/2019 5:59 PM

## 2019-09-17 ENCOUNTER — Encounter (HOSPITAL_COMMUNITY): Payer: Self-pay

## 2019-09-17 ENCOUNTER — Encounter (HOSPITAL_COMMUNITY)
Admission: RE | Admit: 2019-09-17 | Discharge: 2019-09-17 | Disposition: A | Discharge status: Home / Self Care | Payer: Self-pay | Source: Ambulatory Visit | Attending: Internal Medicine | Admitting: Internal Medicine

## 2019-09-17 VITALS — BP 108/82 | HR 59 | Temp 96.8°F | Ht 63.0 in | Wt 151.0 lb

## 2019-09-17 DIAGNOSIS — I214 Non-ST elevation (NSTEMI) myocardial infarction: Secondary | ICD-10-CM

## 2019-09-17 DIAGNOSIS — Z955 Presence of coronary angioplasty implant and graft: Secondary | ICD-10-CM

## 2019-09-17 HISTORY — DX: Atherosclerotic heart disease of native coronary artery without angina pectoris: I25.10

## 2019-09-17 NOTE — Progress Notes (Signed)
Pt is interested in participating in Virtual Cardiac and Pulmonary Rehab. Pt advised that Virtual Cardiac and Pulmonary Rehab is provided at no cost to the patient.  Checklist:  1. Pt has smart device  ie smartphone and/or ipad for downloading an app  Yes 2. Reliable internet/wifi service    Yes 3. Understands how to use their smartphone and navigate within an app.  Yes   Pt verbalized understanding and is in agreement.            Confirm Consent - In the setting of the current Covid19 crisis, you are scheduled for a phone visit with your Cardiac or Pulmonary team member.  Just as we do with many in-gym visits, in order for you to participate in this visit, we must obtain consent.  If you'd like, I can send this to your mychart (if signed up) or email for you to review.  Otherwise, I can obtain your verbal consent now.  By agreeing to a telephone visit, we'd like you to understand that the technology does not allow for your Cardiac or Pulmonary Rehab team member to perform a physical assessment, and thus may limit their ability to fully assess your ability to perform exercise programs. If your provider identifies any concerns that need to be evaluated in person, we will make arrangements to do so.  Finally, though the technology is pretty good, we cannot assure that it will always work on either your or our end and we cannot ensure that we have a secure connection.  Cardiac and Pulmonary Rehab Telehealth visits and "At Home" cardiac and pulmonary rehab are provided at no cost to you.        Are you willing to proceed?"        STAFF: Did the patient verbally acknowledge consent to telehealth visit? Document YES/NO here: Yes     Barnet Pall RN  Cardiac and Pulmonary Rehab Staff        Date 09/17/19    @ Time 1511

## 2019-09-17 NOTE — Progress Notes (Addendum)
Cardiac Individual Treatment Plan  Patient Details  Name: [name] MRN: [id number] Date of Birth: July 29, 1941 Referring Provider:     CARDIAC REHAB PHASE II ORIENTATION from 09/17/2019 in [place]  Referring Provider  Fay Records MD       Initial Encounter Date:    CARDIAC REHAB PHASE II ORIENTATION from 09/17/2019 in [facility]  Date  09/17/19      Visit Diagnosis: NSTEMI (non-ST elevated myocardial infarction) ([place]) 05/31/19  Status post coronary artery stent placement 06/01/19 S/P DES CFX  Patient's Home Medications on Admission:  Current Outpatient Medications:  .  Alirocumab (PRALUENT) 75 MG/ML SOAJ, Inject 1 pen into the skin every 14 (fourteen) days., Disp: 2 pen, Rfl: 11 .  AMBULATORY NON FORMULARY MEDICATION, Take 180 mg by mouth daily. Medication Name: 180 mg bempedoic acid vs a placebo, CLEAR Research Study drug provided, Disp: , Rfl:  .  amLODipine (NORVASC) 2.5 MG tablet, Take 1 tablet (2.5 mg total) by mouth daily., Disp: 90 tablet, Rfl: 3 .  aspirin EC 81 MG tablet, Take 81 mg by mouth daily., Disp: , Rfl:  .  Bimatoprost (LUMIGAN OP), Place 1 drop into both eyes at bedtime. , Disp: , Rfl:  .  chlorthalidone (HYGROTON) 25 MG tablet, TAKE 1 TABLET DAILY., Disp: 90 tablet, Rfl: 3 .  Cholecalciferol (VITAMIN D-3) 5000 UNITS TABS, Take 5,000 mg by mouth daily., Disp: , Rfl:  .  dorzolamide-timolol (COSOPT) 22.3-6.8 MG/ML ophthalmic solution, Place 1 drop into both eyes 2 (two) times daily. , Disp: , Rfl:  .  ezetimibe (ZETIA) 10 MG tablet, TAKE 1 TABLET EACH DAY., Disp: 90 tablet, Rfl: 0 .  famotidine (PEPCID) 20 MG tablet, Take 1 tablet (20 mg total) by mouth 2 (two) times daily., Disp: 180 tablet, Rfl: 3 .  Icosapent Ethyl (VASCEPA) 1 g CAPS, Take 2 capsules (2 g total) by mouth 2 (two) times daily., Disp: 120 capsule, Rfl: 11 .  levothyroxine (SYNTHROID) 50 MCG tablet, Take 1 tablet (50 mcg total)  by mouth daily before breakfast., Disp: 90 tablet, Rfl: 3 .  metoprolol tartrate (LOPRESSOR) 25 MG tablet, Take 1 tablet (25 mg total) by mouth 2 (two) times daily., Disp: 180 tablet, Rfl: 3 .  nitroGLYCERIN (NITROSTAT) 0.4 MG SL tablet, Place 1 tablet (0.4 mg total) under the tongue every 5 (five) minutes x 3 doses as needed for chest pain., Disp: 25 tablet, Rfl: 12 .  pilocarpine (PILOCAR) 1 % ophthalmic solution, Place 1 drop into both eyes 2 (two) times daily. , Disp: , Rfl:  .  Polyethyl Glycol-Propyl Glycol (SYSTANE OP), Place 1-2 drops into both eyes as needed (for dry eyes). , Disp: , Rfl:  .  Probiotic Product (PROBIOTIC DAILY) CAPS, Take 1 capsule by mouth daily. Reported on 10/10/2015, Disp: , Rfl:  .  ticagrelor (BRILINTA) 90 MG TABS tablet, Take 1 tablet (90 mg total) by mouth 2 (two) times daily., Disp: 60 tablet, Rfl: 12 .  potassium chloride SA (KLOR-CON) 20 MEQ tablet, Take 1 tablet (20 mEq total) by mouth daily as needed (see directions from clinic). (Patient not taking: Reported on 09/16/2019), Disp: 30 tablet, Rfl: 3  Past Medical History: Past Medical History:  Diagnosis Date  . Abnormal thyroid function test 07/01/2013  . Anxiety   . Arthritis   . Cancer ([place])   . Coronary artery disease   . Dermatitis 11/25/2013  . Glaucoma 11/02/2012  . HTN (hypertension)  11/02/2012  . 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 Hyperlipidemia   . Hypothyroid   . Obesity     Tobacco Use: Social History   Tobacco Use  Smoking Status Never Smoker  Smokeless Tobacco Never Used    Labs: Recent Review Flowsheet Data    Labs for ITP Cardiac and Pulmonary Rehab Latest Ref Rng & Units 12/30/2018 04/21/2019 04/29/2019 06/01/2019 09/02/2019   Cholestrol 0 - 200 mg/dL 212(H) - - 198 105   LDLCALC 0 - 99 mg/dL 134(H) - - 128(H) 31   LDLDIRECT mg/dL - - 114.0 - -   HDL >39.00 mg/dL 45 - - 50 52.60   Trlycerides 0.0 - 149.0 mg/dL 167(H) - - 98 109.0    Hemoglobin A1c - 5.5 5.5 - - -      Capillary Blood Glucose: No results found for: GLUCAP   Exercise Target Goals: Exercise Program Goal: Individual exercise prescription set using results from initial 6 min walk test and THRR while considering  patient's activity barriers and safety.   Exercise Prescription Goal: Starting with aerobic activity 30 plus minutes a day, 3 days per week for initial exercise prescription. Provide home exercise prescription and guidelines that participant acknowledges understanding prior to discharge.  Activity Barriers & Risk Stratification: Activity Barriers & Cardiac Risk Stratification - 09/17/19 1445      Activity Barriers & Cardiac Risk Stratification   Activity Barriers  Arthritis    Cardiac Risk Stratification  High       6 Minute Walk: 6 Minute Walk    Row Name 09/17/19 1442         6 Minute Walk   Phase  Initial     Distance  1095 feet     Walk Time  0 minutes     # of Rest Breaks  0     MPH  2.07     METS  1.98     RPE  13     Perceived Dyspnea   0     VO2 Peak  6.9     Symptoms  No     Resting HR  59 bpm     Resting BP  108/82     Resting Oxygen Saturation   97 %     Exercise Oxygen Saturation  during 6 min walk  97 %     Max Ex. HR  86 bpm     Max Ex. BP  138/84     2 Minute Post BP  110/70        Oxygen Initial Assessment:   Oxygen Re-Evaluation:   Oxygen Discharge (Final Oxygen Re-Evaluation):   Initial Exercise Prescription: Initial Exercise Prescription - 09/17/19 1400      Date of Initial Exercise RX and Referring Provider   Date  09/17/19    Referring Provider  Fay Records MD       Bike   Minutes  30      Track   Minutes  15      Prescription Details   Frequency (times per week)  5    Duration  Progress to 30 minutes of continuous aerobic without signs/symptoms of physical distress      Intensity   THRR 40-80% of Max Heartrate  57-114    Ratings of Perceived Exertion  11-13    Perceived  Dyspnea  0-4      Progression   Progression  Continue progressive overload  as per policy without signs/symptoms or physical distress.      Resistance Training   Training Prescription  No       Perform Capillary Blood Glucose checks as needed.  Exercise Prescription Changes:   Exercise Comments:   Exercise Goals and Review:   Exercise Goals Re-Evaluation :    Discharge Exercise Prescription (Final Exercise Prescription Changes):   Nutrition:  Target Goals: Understanding of nutrition guidelines, daily intake of sodium 1500mg , cholesterol 200mg , calories 30% from fat and 7% or less from saturated fats, daily to have 5 or more servings of fruits and vegetables.  Biometrics: Pre Biometrics - 09/17/19 1445      Pre Biometrics   Height  5\' 3"  (1.6 m)    Weight  68.5 kg    Waist Circumference  40 inches    Hip Circumference  39 inches    Waist to Hip Ratio  1.03 %    BMI (Calculated)  26.76    Triceps Skinfold  16 mm    % Body Fat  38.3 %    Grip Strength  27 kg    Flexibility  13 in    Single Leg Stand  9.81 seconds        Nutrition Therapy Plan and Nutrition Goals:   Nutrition Assessments:   Nutrition Goals Re-Evaluation:   Nutrition Goals Discharge (Final Nutrition Goals Re-Evaluation):   Psychosocial: Target Goals: Acknowledge presence or absence of significant depression and/or stress, maximize coping skills, provide positive support system. Participant is able to verbalize types and ability to use techniques and skills needed for reducing stress and depression.  Initial Review & Psychosocial Screening: Initial Psych Review & Screening - 09/17/19 1524      Initial Review   Current issues with  None Identified      Family Dynamics   Good Support System?  Yes   Geriann lives alone but has her daughter for support who lives in town     Barriers   Psychosocial barriers to participate in program  There are no identifiable barriers or psychosocial  needs.      Screening Interventions   Interventions  Encouraged to exercise       Quality of Life Scores: Quality of Life - 09/17/19 1440      Quality of Life   Select  Quality of Life      Quality of Life Scores   Health/Function Pre  24.21 %    Socioeconomic Pre  27.92 %    Psych/Spiritual Pre  27.07 %    Family Pre  29.38 %    GLOBAL Pre  26.24 %      Scores of 19 and below usually indicate a poorer quality of life in these areas.  A difference of  2-3 points is a clinically meaningful difference.  A difference of 2-3 points in the total score of the Quality of Life Index has been associated with significant improvement in overall quality of life, self-image, physical symptoms, and general health in studies assessing change in quality of life.  PHQ-9: Recent Review Flowsheet Data    Depression screen [facility] 2/9 09/02/2019 07/27/2019 04/29/2019 11/17/2018 03/06/2018   Decreased Interest 0 0 0 1 0    Down, Depressed, Hopeless 0 0 0 1 0   PHQ - 2 Score 0 0 0 2 0   Altered sleeping 0 2 - 1 -   Tired, decreased energy 1 0 - 1 -   Change in appetite 0 0 -  0 -   Feeling bad or failure about yourself  0 0 - 0 -   Trouble concentrating 0 0 - 0 -   Moving slowly or fidgety/restless 0 0 - 0 -   Suicidal thoughts 0 0 - 0 -   PHQ-9 Score 1 2 - 4 -   Difficult doing work/chores Not difficult at all Not difficult at all - Not difficult at all -     Interpretation of Total Score  Total Score Depression Severity:  1-4 = Minimal depression, 5-9 = Mild depression, 10-14 = Moderate depression, 15-19 = Moderately severe depression, 20-27 = Severe depression   Psychosocial Evaluation and Intervention:   Psychosocial Re-Evaluation:   Psychosocial Discharge (Final Psychosocial Re-Evaluation):   Vocational Rehabilitation: Provide vocational rehab assistance to qualifying candidates.   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      Education: Education Goals: Education classes will be provided on a weekly basis, covering required topics. Participant will state understanding/return demonstration of topics presented.  Learning Barriers/Preferences: Learning Barriers/Preferences - 09/17/19 1440      Learning Barriers/Preferences   Learning Barriers  Sight    Learning Preferences  Written Material;Verbal Instruction;Skilled Demonstration       Education Topics: Hypertension, Hypertension Reduction -Define heart disease and high blood pressure. Discus how high blood pressure affects the body and ways to reduce high blood pressure.   Exercise and Your Heart -Discuss why it is important to exercise, the FITT principles of exercise, normal and abnormal responses to exercise, and how to exercise safely.   Angina -Discuss definition of angina, causes of angina, treatment of angina, and how to decrease risk of having angina.   Cardiac Medications -Review what the following cardiac medications are used for, how they affect the body, and side effects that may occur when taking the medications.  Medications include Aspirin, Beta blockers, calcium channel blockers, ACE Inhibitors, angiotensin receptor blockers, diuretics, digoxin, and antihyperlipidemics.   Congestive Heart Failure -Discuss the definition of CHF, how to live with CHF, the signs and symptoms of CHF, and how keep track of weight and sodium intake.   Heart Disease and Intimacy -Discus the effect sexual activity has on the heart, how changes occur during intimacy as we age, and safety during sexual activity.   Smoking Cessation / COPD -Discuss different methods to quit smoking, the health benefits of quitting smoking, and the definition of COPD.   Nutrition I: Fats -Discuss the  types of cholesterol, what cholesterol does to the heart, and how cholesterol levels can be controlled.   Nutrition II: Labels -Discuss the different components of food labels and how to read food label   Heart Parts/Heart Disease and PAD -Discuss the anatomy of the heart, the pathway of blood circulation through the heart, and these are affected by heart disease.   Stress I: Signs and Symptoms -Discuss the causes of stress, how stress may lead to anxiety and depression, and ways to limit stress.   Stress II: Relaxation -Discuss different types of relaxation techniques to limit stress.   Warning Signs of Stroke / TIA -Discuss definition of a stroke, what the signs and symptoms are of a stroke, and how to identify when someone is having stroke.   Knowledge Questionnaire Score:  Knowledge Questionnaire Score - 09/17/19 1439      Knowledge Questionnaire Score   Pre Score  21/24       Core Components/Risk Factors/Patient Goals at Admission: Personal Goals and Risk Factors at Admission - 09/17/19 1526      Core Components/Risk Factors/Patient Goals on Admission    Weight Management  Yes;Weight Maintenance    Intervention  Weight Management: Develop a combined nutrition and exercise program designed to reach desired caloric intake, while maintaining appropriate intake of nutrient and fiber, sodium and fats, and appropriate energy expenditure required for the weight goal.;Weight Management: Provide education and appropriate resources to help participant work on and attain dietary goals.;Weight Management/Obesity: Establish reasonable short term and long term weight goals.    Expected Outcomes  Short Term: Continue to assess and modify interventions until short term weight is achieved;Long Term: Adherence to nutrition and physical activity/exercise program aimed toward attainment of established weight goal;Weight Maintenance: Understanding of the daily nutrition guidelines, which includes  25-35% calories from fat, 7% or less cal from saturated fats, less than 200mg  cholesterol, less than 1.5gm of sodium, & 5 or more servings of fruits and vegetables daily;Understanding recommendations for meals to include 15-35% energy as protein, 25-35% energy from fat, 35-60% energy from carbohydrates, less than 200mg  of dietary cholesterol, 20-35 gm of total fiber daily;Understanding of distribution of calorie intake throughout the day with the consumption of 4-5 meals/snacks    Hypertension  Yes    Intervention  Provide education on lifestyle modifcations including regular physical activity/exercise, weight management, moderate sodium restriction and increased consumption of fresh fruit, vegetables, and low fat dairy, alcohol moderation, and smoking cessation.;Monitor prescription use compliance.    Expected Outcomes  Short Term: Continued assessment and intervention until BP is < 140/69mm HG in hypertensive participants. < 130/62mm HG in hypertensive participants with diabetes, heart failure or chronic kidney disease.;Long Term: Maintenance of blood pressure at goal levels.    Lipids  Yes    Intervention  Provide education and support for participant on nutrition & aerobic/resistive exercise along with prescribed medications to achieve LDL 70mg , HDL >40mg .    Expected Outcomes  Short Term: Participant states understanding of desired cholesterol values and is compliant with medications prescribed. Participant is following exercise prescription and nutrition guidelines.;Long Term: Cholesterol controlled with medications as prescribed, with individualized exercise RX and with personalized nutrition plan. Value goals: LDL < 70mg , HDL > 40 mg.       Core Components/Risk Factors/Patient Goals Review:    Core Components/Risk Factors/Patient Goals at Discharge (Final Review):    ITP Comments: ITP Comments    Row Name 09/17/19 1433           ITP Comments  Dr. Fransico Him, Medical Director           Comments: Patient attended orientation on 09/17/2019 to review rules and guidelines for program.  Completed 6 minute walk test, Intitial ITP, and exercise prescription.  VSS. Telemetry-Sinus Rhythm, Sinus Brady HR low noted at 59.  Asymptomatic. Safety measures and social distancing in place per CDC guidelines. Unable to download the virtual APP due to wifi issues. Patient's daughter to assist with completing download of APP. Will follow up with patient tomorrow.Barnet Pall, RN,BSN 09/17/2019 3:34 PM

## 2019-09-17 NOTE — Progress Notes (Signed)
I have reviewed a Home Exercise Prescription with [name] . [name] is currently exercising at home.  The patient was advised to continue to bike 4-7 days a week for 30 minutes.  [name] and I discussed how to progress their exercise prescription.  The patient stated that their goals were to increase overall physical health. The patient stated that they understand the exercise prescription. We reviewed exercise guidelines, target heart rate during exercise, RPE Scale, weather conditions, NTG use, endpoints for exercise, warmup and cool down. Patient is encouraged to come to me with any questions. I will continue to follow up with the patient to assist them with progression and safety.    Carma Lair MS, ACSM CEP 3:14 PM 09/17/2019

## 2019-09-18 ENCOUNTER — Other Ambulatory Visit: Payer: Self-pay

## 2019-09-18 ENCOUNTER — Encounter (HOSPITAL_COMMUNITY)
Admission: RE | Admit: 2019-09-18 | Discharge: 2019-09-18 | Disposition: A | Discharge status: Home / Self Care | Payer: Self-pay | Source: Ambulatory Visit | Attending: Internal Medicine | Admitting: Internal Medicine

## 2019-09-18 DIAGNOSIS — I214 Non-ST elevation (NSTEMI) myocardial infarction: Secondary | ICD-10-CM | POA: Insufficient documentation

## 2019-09-18 DIAGNOSIS — Z955 Presence of coronary angioplasty implant and graft: Secondary | ICD-10-CM | POA: Insufficient documentation

## 2019-09-18 NOTE — Progress Notes (Signed)
Cardiac Rehab Note: Successful telephone encounter to [name] to assist with installation and utilization of the [facility] Virtual Cardiac Rehab App. [name] was successful with installation of app on her IPAD however there were barriers to commpleting registration. After attempting to "troubleshoot" over the phone, it is agreed upon that patient will present in person to CR 09/21/2019 after 9:00 am MD appointment. Patient is instructed to bring her cell phone and IPAD. Patient verbalized understanding.  Plan: In-person follow up 09/21/19 for VCR app assistance  Khamil Lamica E. Laray Anger, BSN

## 2019-09-19 ENCOUNTER — Other Ambulatory Visit: Payer: Self-pay | Admitting: Family Medicine

## 2019-09-21 ENCOUNTER — Other Ambulatory Visit: Payer: Self-pay

## 2019-09-21 ENCOUNTER — Ambulatory Visit (HOSPITAL_COMMUNITY): Payer: Medicare Other

## 2019-09-21 ENCOUNTER — Other Ambulatory Visit: Payer: Medicare PPO | Admitting: *Deleted

## 2019-09-21 ENCOUNTER — Encounter (HOSPITAL_COMMUNITY)
Admission: RE | Admit: 2019-09-21 | Discharge: 2019-09-21 | Disposition: A | Discharge status: Home / Self Care | Payer: Self-pay | Source: Ambulatory Visit | Attending: Internal Medicine | Admitting: Internal Medicine

## 2019-09-21 DIAGNOSIS — Z955 Presence of coronary angioplasty implant and graft: Secondary | ICD-10-CM

## 2019-09-21 DIAGNOSIS — T466X5A Adverse effect of antihyperlipidemic and antiarteriosclerotic drugs, initial encounter: Secondary | ICD-10-CM

## 2019-09-21 DIAGNOSIS — E782 Mixed hyperlipidemia: Secondary | ICD-10-CM

## 2019-09-21 DIAGNOSIS — G720 Drug-induced myopathy: Secondary | ICD-10-CM | POA: Diagnosis not present

## 2019-09-21 DIAGNOSIS — I214 Non-ST elevation (NSTEMI) myocardial infarction: Secondary | ICD-10-CM

## 2019-09-21 LAB — LIPID PANEL
Chol/HDL Ratio: 2 ratio (ref 0.0–4.4)
Cholesterol, Total: 112 mg/dL (ref 100–199)
HDL: 56 mg/dL (ref >39)
LDL Chol Calc (NIH): 40 mg/dL (ref 0–99)
Triglycerides: 77 mg/dL (ref 0–149)
VLDL Cholesterol Cal: 16 mg/dL (ref 5–40)

## 2019-09-21 LAB — HEPATIC FUNCTION PANEL
ALT: 15 IU/L (ref 0–32)
AST: 14 IU/L (ref 0–40)
Albumin: 4.6 g/dL (ref 3.7–4.7)
Alkaline Phosphatase: 49 IU/L (ref 39–117)
Bilirubin Total: 0.5 mg/dL (ref 0.0–1.2)
Bilirubin, Direct: 0.21 mg/dL (ref 0.00–0.40)
Total Protein: 6.5 g/dL (ref 6.0–8.5)

## 2019-09-21 NOTE — Progress Notes (Signed)
Cardiac Rehab Note: Ms. Skellenger presented for her in-person CR session to access assistance with downloading and installation of the [facility] [employer] Virtual CR app on her IPAD. App was successfully installed and account was created. In depth verbal and written instruction provided to patient on app usage including how to message staff and log daily vitals and exercise. Patient appreciative of assistance and states she would not have been able to understand the app without a personal tutorial.   Plan: Will continue to follow patient weekly via telephone call/app  Ondine Gemme E. Laray Anger, BSN

## 2019-09-23 ENCOUNTER — Ambulatory Visit (HOSPITAL_COMMUNITY): Payer: Medicare Other

## 2019-09-23 ENCOUNTER — Encounter (HOSPITAL_COMMUNITY)
Admission: RE | Admit: 2019-09-23 | Discharge: 2019-09-23 | Disposition: A | Discharge status: Home / Self Care | Payer: Self-pay | Source: Ambulatory Visit | Attending: Internal Medicine | Admitting: Internal Medicine

## 2019-09-23 NOTE — Progress Notes (Signed)
Virtual Visit: Cardiac Rehab  Patient returned my call regarding logging exercise in the app. Patient has a Hotel [employer] bike at home that she is riding 30 minutes daily without any problems. Patient isn't able to walk as a modality because of plantar fasciitis. Patient felt recording everything in the app was a little overwhelming, so I let her know the main points would be to record her type, time, frequency of exercise and heart rate, and patient is amenable to this. Patient was previously exercising with an in-home personal trainer but plans to wait until after receiving the COVID-19 vaccine before resuming those sessions. Patient has not been doing the stretching exercises but plans to start using her 3 lb. hand weights in addition to riding her bike. Patient mentioned that she met with her dietitian today and would like to change her in-person visit with Ailene Ravel the cardiac rehab dietitian tomorrow to a virtual visit. I will let the dietitian know. Patient is going to be riding her bike after this consultation and would like a confirmation message that I can see her exercise logged in the app.  Sol Passer, [place], ACSM CEP 09/24/2019 [phone]

## 2019-09-24 ENCOUNTER — Inpatient Hospital Stay (HOSPITAL_COMMUNITY)
Admission: RE | Admit: 2019-09-24 | Discharge: 2019-09-24 | Disposition: A | Discharge status: Home / Self Care | Payer: Medicare PPO | Source: Ambulatory Visit

## 2019-09-24 ENCOUNTER — Other Ambulatory Visit: Payer: Self-pay

## 2019-09-24 NOTE — Progress Notes (Signed)
Nutrition Note: Virtual Visit  Spoke with Meli over phone for virtual cardiac rehab. Pt is exercising at home.  She is reporting exercise in virtual app.  Diet recall reviewed. Pt currently working with another RD. She has lost 30 lbs over the past few years working with this RD. Discussed heart health and diet. No need for f/u.  Provided contact information if Quentella has any questions or further need for f/u.   Michaele Offer, MS, RDN, LDN

## 2019-09-25 ENCOUNTER — Ambulatory Visit (HOSPITAL_COMMUNITY): Payer: Medicare Other

## 2019-09-28 ENCOUNTER — Ambulatory Visit (HOSPITAL_COMMUNITY): Payer: Medicare Other

## 2019-09-30 ENCOUNTER — Ambulatory Visit (HOSPITAL_COMMUNITY): Payer: Medicare Other

## 2019-10-02 ENCOUNTER — Encounter (HOSPITAL_COMMUNITY)
Admission: RE | Admit: 2019-10-02 | Discharge: 2019-10-02 | Disposition: A | Discharge status: Home / Self Care | Payer: Self-pay | Source: Ambulatory Visit | Attending: Internal Medicine | Admitting: Internal Medicine

## 2019-10-02 ENCOUNTER — Telehealth: Payer: Self-pay | Admitting: *Deleted

## 2019-10-02 ENCOUNTER — Ambulatory Visit (HOSPITAL_COMMUNITY): Payer: Medicare Other

## 2019-10-02 DIAGNOSIS — Z006 Encounter for examination for normal comparison and control in clinical research program: Secondary | ICD-10-CM

## 2019-10-02 DIAGNOSIS — I214 Non-ST elevation (NSTEMI) myocardial infarction: Secondary | ICD-10-CM

## 2019-10-02 DIAGNOSIS — Z955 Presence of coronary angioplasty implant and graft: Secondary | ICD-10-CM

## 2019-10-02 NOTE — Telephone Encounter (Signed)
Spoke with subject for visit [id number] in the clear research study.  No aes or saes to report.  New medications discussed.  Next in person clinic visit confirmed.

## 2019-10-02 NOTE — Progress Notes (Signed)
Cardiac Rehab: Virtual Visit  Spoke with patient today regarding progress with exercise. Patient states that she hasn't ridden her bike this week because her legs were tired, and her arms were a little sore. Patient states she's been walking some around the house and doing general housework to stay active. Patient exercised 5 days in a row last week, and I suggested that she try exercising every other day to see if that helps with muscle tiredness and soreness, and patient is amenable to this. Patient is stretching and using hand weights as part of her exercise routine. Patient previously exercised with a personal trainer and plans to do some of the balance exercises that she was previously given to help with her balance. At the end of our consultation, patient mentioned that she was having issues with constipation that she thought might be related to some of the medications that she's taking. I transferred patient to Trish Fountain, RN on of our cardiac rehab nurse case managers for consultation. Will follow-up with patient next week.   Sol Passer, [place], ACSM Juanito Doom 10/02/2019 [phone]

## 2019-10-02 NOTE — Progress Notes (Signed)
Virtual Cardiac Rehab Note:  During telephone follow up with Valora Piccolo, EP patient expressed concern over constipation. Ms. Celesta Aver request RN consult patient regarding patient concerns. Patient states "I have to sit for a while in order to have a BP and I think it is coming from my medications". Patient denies hard stool. After assessment, patient admits to not drinking what she feels is enough fluid throughout the day. She also admits to needing to increase her fiber. RN suggested "natural" interventions at this time such as exercise, hydration, high fiber foods including bran cereals, whole grain foods, oranges, prunes, beans, apples and broccoli. Patient requested resources for high fiber foods to be placed in postal mail for her reading. Patient appreciative of suggestions and agreeable to advice.  Plan: Follow up next week. Mail copy of high fiber foods.  Lilia Letterman E. Rollene Rotunda RN, BSN [place]. [facility] Cardiac and Pulmonary [facility] [place] Direct: [phone]

## 2019-10-05 ENCOUNTER — Ambulatory Visit: Payer: Medicare PPO | Attending: Internal Medicine

## 2019-10-05 ENCOUNTER — Ambulatory Visit (HOSPITAL_COMMUNITY): Payer: Medicare Other

## 2019-10-05 DIAGNOSIS — Z23 Encounter for immunization: Secondary | ICD-10-CM | POA: Insufficient documentation

## 2019-10-05 NOTE — Progress Notes (Signed)
   Covid-19 Vaccination Clinic  Name:  LANDY KANGAS    MRN: [id number] DOB: November 26, 1940  10/05/2019  Ms. Stayner was observed post Covid-19 immunization for 15 minutes without incidence. She was provided with Vaccine Information Sheet and instruction to access the V-Safe system.   Ms. Moles was instructed to call 911 with any severe reactions post vaccine: Marland Kitchen Difficulty breathing  . Swelling of your face and throat  . A fast heartbeat  . A bad rash all over your body  . Dizziness and weakness    Immunizations Administered    Name Date Dose VIS Date Route   Pfizer COVID-19 Vaccine 10/05/2019  9:17 AM 0.3 mL 08/21/2019 Intramuscular   Manufacturer: [place]   Lot: [id number]   [place]: [id number]

## 2019-10-07 ENCOUNTER — Ambulatory Visit (HOSPITAL_COMMUNITY): Payer: Medicare Other

## 2019-10-09 ENCOUNTER — Ambulatory Visit (HOSPITAL_COMMUNITY): Payer: Medicare Other

## 2019-10-09 ENCOUNTER — Encounter (HOSPITAL_COMMUNITY)
Admission: RE | Admit: 2019-10-09 | Discharge: 2019-10-09 | Disposition: A | Discharge status: Home / Self Care | Payer: Medicare PPO | Source: Ambulatory Visit | Attending: Internal Medicine | Admitting: Internal Medicine

## 2019-10-09 DIAGNOSIS — Z955 Presence of coronary angioplasty implant and graft: Secondary | ICD-10-CM | POA: Insufficient documentation

## 2019-10-09 DIAGNOSIS — I214 Non-ST elevation (NSTEMI) myocardial infarction: Secondary | ICD-10-CM

## 2019-10-09 NOTE — Progress Notes (Signed)
Cardiac Rehab: Virtual Visit  Spoke with patient today regarding her progress with exercise. Patient is doing very well, riding her bike 20-30 minutes, at least 5 days/week. Patient is happy about her blood pressure readings and will take her log with her to her appointment with Dr. Harrington Challenger on Monday. Patient states that she's maintaining her weight. We discussed ways she can help with weight loss including increasing her duration and/or frequency of exercise, and patient is amenable to this. Patient asked about increasing her exercise by exercising twice/day versus once/day, and I told her that would be fine. Patient states she really appreciates the virtual cardiac rehab program, and she feels that the app makes her more accountable. Patient would like to participate in the onsite CR program once classes resume in the near future, and she would like to try different equipment once she begins the onsite program. I asked if patient feels that her stamina is improving, and she states that she still has some days that she doesn't have as much energy, but she's an active person and most days, she is active around the house. Will continue to follow-up on patient's progress.  Sol Passer, [place], ACSM Juanito Doom 10/09/2019 [phone]

## 2019-10-11 NOTE — Progress Notes (Signed)
Cardiology Office Note   Date:  10/12/2019   ID:  [name] Aug 10, 1941, MRN [id number]  PCP:  Marin Olp, MD  Cardiologist:   Dorris Carnes, MD   F/U of CAD    History of Present Illness: [name] is a 79 y.o. female with a history of CAD  Shewas admitted in April 2020 with CP   CT chest/abd showed extensive atherosclerosis.   Cardac CT showed no obstructive lesons   I saw her as a televisit in May 2020    In September she was admtted with an NSTEMI.  Cardiac catheterization showed severe single-vessel disease with a critical stenosis of the left circumflex.  She underwent PTCA/DES to this area.  There was nonobstructive disease noted in the LAD.  Plan for dual antiplatelet therapy for 12 months.  The patient was enrolled in the clear outcomes trial she has been since 2018 her LDL has trended down from 222 128 she was seen in October by [facility] and Praluent was added along with Vascepa.  She discontinued DHEA and krill oil.  Last lipid panel 3 weeks ago LDL was 40 HDL was 56.      She also has a hx of breast CA (s/p mastectomy), HTN, HL.   The patient denies chest discomfort.  She says her breathing is okay.  She is working hard to stay healthy.  It has been a difficult year with Covid and also with the loss of her husband.       Current Meds  Medication Sig  . Alirocumab (PRALUENT) 75 MG/ML SOAJ Inject 1 pen into the skin every 14 (fourteen) days.  . AMBULATORY NON FORMULARY MEDICATION Take 180 mg by mouth daily. Medication Name: 180 mg bempedoic acid vs a placebo, CLEAR Research Study drug provided  . amLODipine (NORVASC) 2.5 MG tablet Take 1 tablet (2.5 mg total) by mouth daily.  Marland Kitchen aspirin EC 81 MG tablet Take 81 mg by mouth daily.  . Bimatoprost (LUMIGAN OP) Place 1 drop into both eyes at bedtime.   . chlorthalidone (HYGROTON) 25 MG tablet TAKE 1 TABLET DAILY.  Marland Kitchen Cholecalciferol (VITAMIN D-3) 5000 UNITS TABS Take 5,000 mg by mouth daily.  .  dorzolamide-timolol (COSOPT) 22.3-6.8 MG/ML ophthalmic solution Place 1 drop into both eyes 2 (two) times daily.   Marland Kitchen ezetimibe (ZETIA) 10 MG tablet TAKE 1 TABLET EACH DAY.  . famotidine (PEPCID) 20 MG tablet Take 1 tablet (20 mg total) by mouth 2 (two) times daily.  Vanessa Kick Ethyl (VASCEPA) 1 g CAPS Take 2 capsules (2 g total) by mouth 2 (two) times daily.  Marland Kitchen levothyroxine (SYNTHROID) 50 MCG tablet Take 1 tablet (50 mcg total) by mouth daily before breakfast.  . nitroGLYCERIN (NITROSTAT) 0.4 MG SL tablet Place 1 tablet (0.4 mg total) under the tongue every 5 (five) minutes x 3 doses as needed for chest pain.  . pilocarpine (PILOCAR) 1 % ophthalmic solution Place 1 drop into both eyes 2 (two) times daily.   Vladimir Faster Glycol-Propyl Glycol (SYSTANE OP) Place 1-2 drops into both eyes as needed (for dry eyes).   . Probiotic Product (PROBIOTIC DAILY) CAPS Take 1 capsule by mouth daily. Reported on 10/10/2015  . ticagrelor (BRILINTA) 90 MG TABS tablet Take 1 tablet (90 mg total) by mouth 2 (two) times daily.     Allergies:   Adhesive [tape] and Rosuvastatin   Past Medical History:  Diagnosis Date  . Abnormal thyroid function test 07/01/2013  .  Anxiety   . Arthritis   . Cancer ([place])   . Coronary artery disease   . Dermatitis 11/25/2013  . Glaucoma 11/02/2012  . HTN (hypertension) 11/02/2012  . 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 Hyperlipidemia   . Hypothyroid   . Obesity     Past Surgical History:  Procedure Laterality Date  . AUGMENTATION MAMMAPLASTY Left    05/2006  . BREAST SURGERY  10/2005   Left Mastectomy, reconstruction, reduction on right. Dr. Harlow Mares  . CARDIAC CATHETERIZATION    . COLONOSCOPY    . CORONARY STENT INTERVENTION N/A 06/01/2019   Procedure: CORONARY STENT INTERVENTION;  Surgeon: Sherren Mocha, MD;  Location: [facility];  Service: Cardiovascular;  Laterality: N/A;  . CRYOTHERAPY     GYN for CIN III  .  DILATION AND CURETTAGE OF UTERUS    . EXCISION MORTON'S NEUROMA     right foot  . EYE SURGERY Right 05/08/2018  . LEFT HEART CATH AND CORONARY ANGIOGRAPHY N/A 06/01/2019   Procedure: LEFT HEART CATH AND CORONARY ANGIOGRAPHY;  Surgeon: Sherren Mocha, MD;  Location: [facility];  Service: Cardiovascular;  Laterality: N/A;  . REDUCTION MAMMAPLASTY Right      Social History:  The patient  reports that she has never smoked. She has never used smokeless tobacco. She reports current alcohol use. She reports that she does not use drugs.   Family History:  The patient's family history includes CAD in her mother; Colon cancer in her paternal grandmother; Hyperlipidemia in her mother and sister; Other in her father; Parkinsonism in her mother; Pneumonia in her mother.    ROS:  Please see the history of present illness. All other systems are reviewed and  Negative to the above problem except as noted.    PHYSICAL EXAM: VS:  BP (!) 140/56   Pulse 71   Ht 5\' 3"  (1.6 m)   Wt 149 lb (67.6 kg)   SpO2 99%   BMI 26.39 kg/m   GEN: Well nourished, well developed, in no acute distress  HEENT: normal  Neck: no JVD, carotid bruits, or masses Cardiac: RRR; no murmurs, rubs, or gallops,no edema  Respiratory:  clear to auscultation bilaterally, normal work of breathing GI: soft, nontender, nondistended, + BS  No hepatomegaly  MS: no deformity Moving all extremities   Skin: warm and dry, no rash Neuro:  Strength and sensation are intact Psych: euthymic mood, full affect   EKG:  EKG is not ordered today.   Lipid Panel    Component Value Date/Time   CHOL 112 09/21/2019 0908   TRIG 77 09/21/2019 0908   HDL 56 09/21/2019 0908   CHOLHDL 2.0 09/21/2019 0908   CHOLHDL 2 09/02/2019 0828   VLDL 21.8 09/02/2019 0828   LDLCALC 40 09/21/2019 0908   LDLDIRECT 114.0 04/29/2019 1102      Wt Readings from Last 3 Encounters:  10/12/19 149 lb (67.6 kg)  09/17/19 151 lb 0.2 oz (68.5 kg)  09/02/19  150 lb (68 kg)    Cardiac Studies  CORONARY STENT INTERVENTION 05/2019  LEFT HEART CATH AND CORONARY ANGIOGRAPHY  Conclusion  1) Severe single vessel CAD with critical stenosis of the mid-circumflex, treated successfully with PTCA and stenting (2.5x12 mm Synergy DES 2) nonobstructive LAD stenosis 3) widely patent RCA, dominant vessel 4) normal LV systolic function  Recommend: DAPT with ASA and ticagrelor x 12 months without interruption. Pt loaded with ticagrelor 180 mg  on the cath lab table. DC home tomorrow am if no complications arise.      ASSESSMENT AND PLAN: 1  CAD patient is doing well status post intervention back in the fall.  She should have a CBC followed she is due to see Dr. Yong Channel in March.  Important since she is on dual antiplatelet therapy.  I would keep her on the same regimen otherwise and plan to see her in the fall.  2  HTN adequate control.  3  HL would continue to follow excellent control of lipids on current regimen I would keep her here.  4.  PAD atherosclerosis of aorta noted.  Control cholesterol. Follow-up early fall.      Current medicines are reviewed at length with the patient today.  The patient does not have concerns regarding medicines.  Signed, Dorris Carnes, MD  10/12/2019 12:23 PM    [facility] [place], [place], [place]  [address] Phone: [phone]; Fax: [phone]

## 2019-10-12 ENCOUNTER — Encounter: Payer: Self-pay | Admitting: Internal Medicine

## 2019-10-12 ENCOUNTER — Ambulatory Visit (HOSPITAL_COMMUNITY): Payer: Medicare Other

## 2019-10-12 ENCOUNTER — Ambulatory Visit: Payer: Medicare PPO | Admitting: Internal Medicine

## 2019-10-12 ENCOUNTER — Other Ambulatory Visit: Payer: Self-pay

## 2019-10-12 VITALS — BP 140/56 | HR 71 | Ht 63.0 in | Wt 149.0 lb

## 2019-10-12 DIAGNOSIS — E7801 Familial hypercholesterolemia: Secondary | ICD-10-CM

## 2019-10-12 DIAGNOSIS — I2510 Atherosclerotic heart disease of native coronary artery without angina pectoris: Secondary | ICD-10-CM

## 2019-10-12 DIAGNOSIS — I10 Essential (primary) hypertension: Secondary | ICD-10-CM

## 2019-10-12 DIAGNOSIS — I739 Peripheral vascular disease, unspecified: Secondary | ICD-10-CM

## 2019-10-12 NOTE — Patient Instructions (Signed)
Medication Instructions:  No changes *If you need a refill on your cardiac medications before your next appointment, please call your pharmacy*  Lab Work: CBC - at Primary doctor visit -have lab results faxed to Dr. Harrington Challenger -take prescription with you.  If you have labs (blood work) drawn today and your tests are completely normal, you will receive your results only by: Marland Kitchen MyChart Message (if you have MyChart) OR . A paper copy in the mail If you have any lab test that is abnormal or we need to change your treatment, we will call you to review the results.  Testing/Procedures: none  Follow-Up: At [facility], you and your health needs are our priority.  As part of our continuing mission to provide you with exceptional heart care, we have created designated Provider Care Teams.  These Care Teams include your primary Cardiologist (physician) and Advanced Practice Providers (APPs -  Physician Assistants and Nurse Practitioners) who all work together to provide you with the care you need, when you need it.  Your next appointment:   6 month(s)  The format for your next appointment:   Either In Person or Virtual  Provider:   You may see Dorris Carnes, MD or one of the following Advanced Practice Providers on your designated Care Team:    Richardson Dopp, PA-C  [place], [place]  Daune Perch, NP   Other Instructions

## 2019-10-14 ENCOUNTER — Other Ambulatory Visit: Payer: Self-pay

## 2019-10-14 ENCOUNTER — Telehealth: Payer: Self-pay | Admitting: Family Medicine

## 2019-10-14 ENCOUNTER — Ambulatory Visit (HOSPITAL_COMMUNITY): Payer: Medicare Other

## 2019-10-14 NOTE — Telephone Encounter (Signed)
Noted  

## 2019-10-14 NOTE — Telephone Encounter (Signed)
Patient called in and was triaged she may have a reaction to Praluent injection. The reaction is a rash, burning bruising, puffiness and painful to the touch. Patient had a similar reaction to previous injection from this 2 does box.  Per [facility]. Patient has been schedule for appt on 10/15/19 @ 11:20am

## 2019-10-15 ENCOUNTER — Encounter: Payer: Self-pay | Admitting: Family Medicine

## 2019-10-15 ENCOUNTER — Ambulatory Visit (INDEPENDENT_AMBULATORY_CARE_PROVIDER_SITE_OTHER): Payer: Medicare PPO | Admitting: Family Medicine

## 2019-10-15 VITALS — BP 122/78 | HR 80 | Temp 97.3°F | Ht 63.0 in | Wt 148.6 lb

## 2019-10-15 DIAGNOSIS — E782 Mixed hyperlipidemia: Secondary | ICD-10-CM | POA: Diagnosis not present

## 2019-10-15 DIAGNOSIS — T887XXA Unspecified adverse effect of drug or medicament, initial encounter: Secondary | ICD-10-CM

## 2019-10-15 DIAGNOSIS — R21 Rash and other nonspecific skin eruption: Secondary | ICD-10-CM | POA: Diagnosis not present

## 2019-10-15 DIAGNOSIS — E663 Overweight: Secondary | ICD-10-CM

## 2019-10-15 NOTE — Patient Instructions (Addendum)
For now- hold off on next praluent until you hear from [place] or cardiology.   I think this may be an allergy - luckily pretty mild- but I want the lipid clinic's opinion before we have you take another  We will update labs in march when we see you back

## 2019-10-15 NOTE — Progress Notes (Signed)
Phone [phone] In person visit   Subjective:   [name] is a 79 y.o. year old very pleasant female patient who presents for/with See problem oriented charting Chief Complaint  Patient presents with  . Follow-up  . reaction to injection   This visit occurred during the SARS-CoV-2 public health emergency.  Safety protocols were in place, including screening questions prior to the visit, additional usage of staff PPE, and extensive cleaning of exam room while observing appropriate contact time as indicated for disinfecting solutions.   Past Medical History-  Patient Active Problem List   Diagnosis Date Noted  . CAD (coronary artery disease) s/p stent mCx 05/2019 06/02/2019    Priority: High  . Grieving 02/18/2017    Priority: High  . Osteoporosis 11/05/2016    Priority: High  . Chest pain 12/29/2018    Priority: Medium  . Primary open-angle glaucoma 11/20/2017    Priority: Medium  . Hypothyroidism 07/01/2013    Priority: Medium  . Hypertension 11/02/2012    Priority: Medium  . Hyperlipidemia 07/24/2011    Priority: Medium  . 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 History of adenomatous polyp of colon 09/30/2018    Priority: Low  . Medicare annual wellness visit, subsequent 09/26/2014    Priority: Low  . Vitamin D deficiency 11/02/2012    Priority: Low  . Obesity (BMI 30-39.9) 07/24/2011    Priority: Low  . Statin myopathy 07/01/2019  . Hypokalemia 06/02/2019  . NSTEMI (non-ST elevated myocardial infarction) ([place]) 05/31/2019  . Pseudophakia of both eyes 11/20/2017    Medications- reviewed and updated Current Outpatient Medications  Medication Sig Dispense Refill  . Alirocumab (PRALUENT) 75 MG/ML SOAJ Inject 1 pen into the skin every 14 (fourteen) days. 2 pen 11  . AMBULATORY NON FORMULARY MEDICATION Take 180 mg by mouth daily. Medication Name: 180 mg bempedoic acid vs a placebo, CLEAR Research  Study drug provided    . amLODipine (NORVASC) 2.5 MG tablet Take 1 tablet (2.5 mg total) by mouth daily. 90 tablet 3  . aspirin EC 81 MG tablet Take 81 mg by mouth daily.    . Bimatoprost (LUMIGAN OP) Place 1 drop into both eyes at bedtime.     . chlorthalidone (HYGROTON) 25 MG tablet TAKE 1 TABLET DAILY. 90 tablet 3  . Cholecalciferol (VITAMIN D-3) 5000 UNITS TABS Take 5,000 mg by mouth daily.    . dorzolamide-timolol (COSOPT) 22.3-6.8 MG/ML ophthalmic solution Place 1 drop into both eyes 2 (two) times daily.     Marland Kitchen ezetimibe (ZETIA) 10 MG tablet TAKE 1 TABLET EACH DAY. 90 tablet 0  . famotidine (PEPCID) 20 MG tablet Take 1 tablet (20 mg total) by mouth 2 (two) times daily. 180 tablet 3  . Icosapent Ethyl (VASCEPA) 1 g CAPS Take 2 capsules (2 g total) by mouth 2 (two) times daily. 120 capsule 11  . levothyroxine (SYNTHROID) 50 MCG tablet Take 1 tablet (50 mcg total) by mouth daily before breakfast. 90 tablet 3  . nitroGLYCERIN (NITROSTAT) 0.4 MG SL tablet Place 1 tablet (0.4 mg total) under the tongue every 5 (five) minutes x 3 doses as needed for chest pain. 25 tablet 12  . pilocarpine (PILOCAR) 1 % ophthalmic solution Place 1 drop into both eyes 2 (two) times daily.     Vladimir Faster Glycol-Propyl Glycol (SYSTANE OP) Place 1-2 drops into both eyes as needed (for dry eyes).     Marland Kitchen  Probiotic Product (PROBIOTIC DAILY) CAPS Take 1 capsule by mouth daily. Reported on 10/10/2015    . ticagrelor (BRILINTA) 90 MG TABS tablet Take 1 tablet (90 mg total) by mouth 2 (two) times daily. 60 tablet 12  . metoprolol tartrate (LOPRESSOR) 25 MG tablet Take 1 tablet (25 mg total) by mouth 2 (two) times daily. 180 tablet 3   No current facility-administered medications for this visit.     Objective:  BP 122/78   Pulse 80   Temp (!) 97.3 F (36.3 C)   Ht 5\' 3"  (1.6 m)   Wt 148 lb 9.6 oz (67.4 kg)   SpO2 98%   BMI 26.32 kg/m  Gen: NAD, resting comfortably CV: RRR no murmurs rubs or gallops Lungs: CTAB no  crackles, wheeze, rhonchi Abdomen: soft/nontender/nondistended/normal bowel sounds.  Over right lower abdomen has erythematous area also associated with bruising over 10 x 10 cm-see picture below      Assessment and Plan   #hyperlipidemia #Rash S: compliant with Praluent.  She is also part of clinical trial for cholesterol.  She also takes Zetia and Vascepa.  In regards to Praluent-the first 6 injections she had no issue with-she states she got these from [facility].  The last 2 injections were from [facility].  She reports that the first 1 on her left abdomen caused a slight red rash, bruising, itching-this had resolved largely by the time she saw Dr. Harrington Challenger and so she did not bring it to her attention.  After her cardiology visit she gave herself a second injection on the right lower abdomen and this time had a larger rash, area of edema, bruising, more intense pruritus.  She is concerned she may have an allergy to the medicine.  She does report she felt like she had a hard time injecting the second time-did not go in as easy as prior injections.  The dates of her injections were January 18, February 1With next planned injection February 15.  She received her first COVID-19 vaccination on January 25. Lab Results  Component Value Date   CHOL 112 09/21/2019   HDL 56 09/21/2019   LDLCALC 40 09/21/2019   LDLDIRECT 114.0 04/29/2019   TRIG 77 09/21/2019   CHOLHDL 2.0 09/21/2019   A/P: 79 year old female with CAD and hyperlipidemia reporting rash on her abdomen at the last 2 Praluent injection sites  Purchased from [facility] with last rash larger and more pruritic (she had no issues with first 6 injections from [facility]).  I suspect this may be an allergy to the Praluent.  She did have a COVID-19 vaccination before the most recent more significant rash on Praluent but I do not strongly suspect these are correlated.  I am going to reach out to [facility] and Dr. Harrington Challenger for their opinion.  Patient did  feel like she had a hard time injecting last visit and wanted to see if she could do the next injection with pharmacy/lipid clinic if possible if they decide to continue medication -I told patient to hold off on taking next dose until she hears back from [place] - I wonder if this could be an issue with current lot of medication (had no issues with first 6 shots from [facility], this lot was from [facility]) -id like Dr. Harrington Challenger and Dr. Prentiss Bells opinion on skipping dose in 2 weeks then attempting one more dose 2 weeks after that.  Has2nd covid 19 vaccine on feb 15 which would be her normal date for  repeat praluent vs choosing another agent -Continue Zetia, Vascepa, study medication for now  #Overweight S: Patient is down from peak weight of 182 down to now 148 on our scales.  Congratulated patient on her efforts.  She is working with a dietitian every 3 weeks.  She also feels like emotional counseling after loss of husband has been beneficial through our visits together A/P: Excellent control/improvement of weight over time. Encouraged need for healthy eating, regular exercise-at this point I do not feel like weight loss has to be her primary goal but instead to primarily continue her healthy habits.  She would like to lose 5 more pounds  #Social update-Supportive family dog has cancer- has to put him down at 4 pm today.  Counseled her about this as this is weighing on her.  Also February 20 it will be 2 years since she lost her husband who she was incredibly close to.   Recommended follow up: We already have planned follow-up in March-she would like to keep that Future Appointments  Date Time Provider [place]  10/26/2019  9:00 AM [place]  12/07/2019  9:00 AM [place] [facility] None  12/08/2019  8:40 AM Yong Channel, Brayton Mars, MD [facility]   Lab/Order associations:   ICD-10-CM   1. Mixed hyperlipidemia  E78.2   2. Overweight  E66.3    Time  Spent: 33 minutes of total time (11:52 AM- 12:15 PM, 7:25-7:35 PM) was spent on the date of the encounter performing the following actions: chart review prior to seeing the patient, obtaining history, performing a medically necessary exam, counseling on the treatment plan, placing orders, and documenting in our EHR.   Return precautions advised.  Garret Reddish, MD

## 2019-10-16 ENCOUNTER — Ambulatory Visit (HOSPITAL_COMMUNITY): Payer: Medicare Other

## 2019-10-16 ENCOUNTER — Telehealth (HOSPITAL_COMMUNITY): Payer: Self-pay

## 2019-10-16 ENCOUNTER — Telehealth: Payer: Self-pay | Admitting: Pharmacist

## 2019-10-16 NOTE — Telephone Encounter (Signed)
Spoke with patient. Reviewed options in previous message. Patient would like to get her COVID shot on 2/15. Skip her Praluent dose that day and then call us back on 2/22 to discuss options again.

## 2019-10-16 NOTE — Telephone Encounter (Signed)
Called patient to discuss options for her PCSK9i. She has had a injection site reaction the last two injections. Called to discuss trying pre-medicating with benadryl and famotidine before her injection to help suppress any allergic reaction, or we could try switching her to Repatha and see if she fares any better.  This could be a lot/injection technique issue, or it could be a delayed allergic reaction. She is more than welcome to set up a time to come to the clinic and give her injection if she would like.

## 2019-10-16 NOTE — Telephone Encounter (Signed)
Pt insurance is active and benefits verified through [facility] $20, DED 0/0 met, out of pocket $3,300/0 met, co-insurance 0%. no pre-authorization required. Passport, 10/16/2019'@2'$ :08pm, REF# [id number]  Will contact patient to see if she is interested in the Cardiac Rehab Program. If interested, patient will need to complete follow up appt. Once completed, patient will be contacted for scheduling upon review by the RN Navigator.

## 2019-10-19 ENCOUNTER — Ambulatory Visit (HOSPITAL_COMMUNITY): Payer: Medicare Other

## 2019-10-19 ENCOUNTER — Telehealth (HOSPITAL_COMMUNITY): Payer: Self-pay

## 2019-10-19 ENCOUNTER — Encounter (HOSPITAL_COMMUNITY): Payer: Self-pay

## 2019-10-19 DIAGNOSIS — Z955 Presence of coronary angioplasty implant and graft: Secondary | ICD-10-CM

## 2019-10-19 DIAGNOSIS — I214 Non-ST elevation (NSTEMI) myocardial infarction: Secondary | ICD-10-CM

## 2019-10-19 NOTE — Addendum Note (Signed)
Encounter addended by: Sol Passer on: 10/19/2019 11:43 AM  Actions taken: Flowsheet data copied forward, Flowsheet accepted

## 2019-10-19 NOTE — Telephone Encounter (Signed)
Phoned pt regarding Cardiac Rehab reopening. Pt states she is very interested. Pt will begin exercise 10/28/19. Pt states she is getting her 2nd COVID vaccine Monday and will call us if she is not feeling well for exercise on Wed. Reviewed with pt mask requirement, appropriate exercise clothing and to make sure she has taken her medications. Pt verbalized understanding.  Carma Lair MS, ACSM CEP 2:32 PM 10/19/2019

## 2019-10-19 NOTE — Progress Notes (Signed)
Cardiac Individual Treatment Plan  Patient Details  Name: [name] MRN: [id number] Date of Birth: Aug 04, 1941 Referring Provider:     CARDIAC REHAB PHASE II ORIENTATION from 09/17/2019 in [place]  Referring Provider  Fay Records MD       Initial Encounter Date:    CARDIAC REHAB PHASE II ORIENTATION from 09/17/2019 in [place]  Date  09/17/19      Visit Diagnosis: NSTEMI (non-ST elevated myocardial infarction) ([place]) 05/31/19  Status post coronary artery stent placement 06/01/19 S/P DES CFX  Patient's Home Medications on Admission:  Current Outpatient Medications:  .  Alirocumab (PRALUENT) 75 MG/ML SOAJ, Inject 1 pen into the skin every 14 (fourteen) days., Disp: 2 pen, Rfl: 11 .  AMBULATORY NON FORMULARY MEDICATION, Take 180 mg by mouth daily. Medication Name: 180 mg bempedoic acid vs a placebo, CLEAR Research Study drug provided, Disp: , Rfl:  .  amLODipine (NORVASC) 2.5 MG tablet, Take 1 tablet (2.5 mg total) by mouth daily., Disp: 90 tablet, Rfl: 3 .  aspirin EC 81 MG tablet, Take 81 mg by mouth daily., Disp: , Rfl:  .  Bimatoprost (LUMIGAN OP), Place 1 drop into both eyes at bedtime. , Disp: , Rfl:  .  chlorthalidone (HYGROTON) 25 MG tablet, TAKE 1 TABLET DAILY., Disp: 90 tablet, Rfl: 3 .  Cholecalciferol (VITAMIN D-3) 5000 UNITS TABS, Take 5,000 mg by mouth daily., Disp: , Rfl:  .  dorzolamide-timolol (COSOPT) 22.3-6.8 MG/ML ophthalmic solution, Place 1 drop into both eyes 2 (two) times daily. , Disp: , Rfl:  .  ezetimibe (ZETIA) 10 MG tablet, TAKE 1 TABLET EACH DAY., Disp: 90 tablet, Rfl: 0 .  famotidine (PEPCID) 20 MG tablet, Take 1 tablet (20 mg total) by mouth 2 (two) times daily., Disp: 180 tablet, Rfl: 3 .  Icosapent Ethyl (VASCEPA) 1 g CAPS, Take 2 capsules (2 g total) by mouth 2 (two) times daily., Disp: 120 capsule, Rfl: 11 .  levothyroxine (SYNTHROID) 50 MCG tablet, Take 1 tablet (50 mcg total)  by mouth daily before breakfast., Disp: 90 tablet, Rfl: 3 .  metoprolol tartrate (LOPRESSOR) 25 MG tablet, Take 1 tablet (25 mg total) by mouth 2 (two) times daily., Disp: 180 tablet, Rfl: 3 .  nitroGLYCERIN (NITROSTAT) 0.4 MG SL tablet, Place 1 tablet (0.4 mg total) under the tongue every 5 (five) minutes x 3 doses as needed for chest pain., Disp: 25 tablet, Rfl: 12 .  pilocarpine (PILOCAR) 1 % ophthalmic solution, Place 1 drop into both eyes 2 (two) times daily. , Disp: , Rfl:  .  Polyethyl Glycol-Propyl Glycol (SYSTANE OP), Place 1-2 drops into both eyes as needed (for dry eyes). , Disp: , Rfl:  .  Probiotic Product (PROBIOTIC DAILY) CAPS, Take 1 capsule by mouth daily. Reported on 10/10/2015, Disp: , Rfl:  .  ticagrelor (BRILINTA) 90 MG TABS tablet, Take 1 tablet (90 mg total) by mouth 2 (two) times daily., Disp: 60 tablet, Rfl: 12  Past Medical History: Past Medical History:  Diagnosis Date  . Abnormal thyroid function test 07/01/2013  . Anxiety   . Arthritis   . Cancer ([place])   . Coronary artery disease   . Dermatitis 11/25/2013  . Glaucoma 11/02/2012  . HTN (hypertension) 11/02/2012  . 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 Hyperlipidemia   . Hypothyroid   . Obesity     Tobacco Use: Social History  Tobacco Use  Smoking Status Never Smoker  Smokeless Tobacco Never Used    Labs: Recent Review Flowsheet Data    Labs for ITP Cardiac and Pulmonary Rehab Latest Ref Rng & Units 04/21/2019 04/29/2019 06/01/2019 09/02/2019 09/21/2019   Cholestrol 100 - 199 mg/dL - - 198 105 112   LDLCALC 0 - 99 mg/dL - - 128(H) 31 40   LDLDIRECT mg/dL - 114.0 - - -   HDL >39 mg/dL - - 50 52.60 56   Trlycerides 0 - 149 mg/dL - - 98 109.0 77   Hemoglobin A1c - 5.5 - - - -      Capillary Blood Glucose: No results found for: GLUCAP   Exercise Target Goals: Exercise Program Goal: Individual exercise prescription set using results from initial 6 min walk  test and THRR while considering  patient's activity barriers and safety.   Exercise Prescription Goal: Initial exercise prescription builds to 30-45 minutes a day of aerobic activity, 2-3 days per week.  Home exercise guidelines will be given to patient during program as part of exercise prescription that the participant will acknowledge.  Activity Barriers & Risk Stratification: Activity Barriers & Cardiac Risk Stratification - 09/17/19 1445      Activity Barriers & Cardiac Risk Stratification   Activity Barriers  Arthritis    Cardiac Risk Stratification  High       6 Minute Walk: 6 Minute Walk    Row Name 09/17/19 1442         6 Minute Walk   Phase  Initial     Distance  1095 feet     Walk Time  0 minutes     # of Rest Breaks  0     MPH  2.07     METS  1.98     RPE  13     Perceived Dyspnea   0     VO2 Peak  6.9     Symptoms  No     Resting HR  59 bpm     Resting BP  108/82     Resting Oxygen Saturation   97 %     Exercise Oxygen Saturation  during 6 min walk  97 %     Max Ex. HR  86 bpm     Max Ex. BP  138/84     2 Minute Post BP  110/70        Oxygen Initial Assessment:   Oxygen Re-Evaluation:   Oxygen Discharge (Final Oxygen Re-Evaluation):   Initial Exercise Prescription: Initial Exercise Prescription - 09/17/19 1400      Date of Initial Exercise RX and Referring Provider   Date  09/17/19    Referring Provider  Fay Records MD       Bike   Minutes  30      Track   Minutes  15      Prescription Details   Frequency (times per week)  5    Duration  Progress to 30 minutes of continuous aerobic without signs/symptoms of physical distress      Intensity   THRR 40-80% of Max Heartrate  57-114    Ratings of Perceived Exertion  11-13    Perceived Dyspnea  0-4      Progression   Progression  Continue progressive overload as per policy without signs/symptoms or physical distress.      Resistance Training   Training Prescription  No        Perform Capillary Blood  Glucose checks as needed.  Exercise Prescription Changes:  Exercise Prescription Changes    Row Name 10/09/19 1140             Response to Exercise   Blood Pressure (Admit)  123/66       Symptoms  none       Comments  Virtual cardiac rehab session       Duration  Continue with 30 min of aerobic exercise without signs/symptoms of physical distress.       Intensity  THRR unchanged         Progression   Progression  Continue to progress workloads to maintain intensity without signs/symptoms of physical distress.         Resistance Training   Training Prescription  No         Bike   Minutes  30 3 miles         Track   Minutes  --         Home Exercise Plan   Plans to continue exercise at  Home (comment) Stationary bike       Initial Home Exercises Provided  09/17/19          Exercise Comments:  Exercise Comments    Row Name 10/19/19 1143           Exercise Comments  Patient will be contacted to begin participation in the onsite cardiac rehab program after temporary closure due to COVID-19 pandemic.          Exercise Goals and Review:   Exercise Goals Re-Evaluation : Exercise Goals Re-Evaluation    Row Name 10/19/19 1142             Exercise Goal Re-Evaluation   Comments  Patient has been actively participating in the virtual cardiac rehab program via the [facility] app and has been progressing well. Patient is riding her stationary bike 30 minutes 4-5 days/week.       Expected Outcomes  Patient will transition to the onsite cardiac rehab program if interested.          Discharge Exercise Prescription (Final Exercise Prescription Changes): Exercise Prescription Changes - 10/09/19 1140      Response to Exercise   Blood Pressure (Admit)  123/66    Symptoms  none    Comments  Virtual cardiac rehab session    Duration  Continue with 30 min of aerobic exercise without signs/symptoms of physical distress.    Intensity   THRR unchanged      Progression   Progression  Continue to progress workloads to maintain intensity without signs/symptoms of physical distress.      Resistance Training   Training Prescription  No      Bike   Minutes  30   3 miles     Track   Minutes  --      Home Exercise Plan   Plans to continue exercise at  Home (comment)   Stationary bike   Initial Home Exercises Provided  09/17/19       Nutrition:  Target Goals: Understanding of nutrition guidelines, daily intake of sodium 1500mg , cholesterol 200mg , calories 30% from fat and 7% or less from saturated fats, daily to have 5 or more servings of fruits and vegetables.  Biometrics: Pre Biometrics - 09/17/19 1445      Pre Biometrics   Height  5\' 3"  (1.6 m)    Weight  151 lb 0.2 oz (68.5 kg)    Waist Circumference  40 inches    Hip Circumference  39 inches    Waist to Hip Ratio  1.03 %    BMI (Calculated)  26.76    Triceps Skinfold  16 mm    % Body Fat  38.3 %    Grip Strength  27 kg    Flexibility  13 in    Single Leg Stand  9.81 seconds        Nutrition Therapy Plan and Nutrition Goals:   Nutrition Assessments:   Nutrition Goals Re-Evaluation:   Nutrition Goals Re-Evaluation:   Nutrition Goals Discharge (Final Nutrition Goals Re-Evaluation):   Psychosocial: Target Goals: Acknowledge presence or absence of significant depression and/or stress, maximize coping skills, provide positive support system. Participant is able to verbalize types and ability to use techniques and skills needed for reducing stress and depression.  Initial Review & Psychosocial Screening: Initial Psych Review & Screening - 09/17/19 1524      Initial Review   Current issues with  None Identified      Family Dynamics   Good Support System?  Yes   Vione lives alone but has her daughter for support who lives in town     Barriers   Psychosocial barriers to participate in program  There are no identifiable barriers or  psychosocial needs.      Screening Interventions   Interventions  Encouraged to exercise       Quality of Life Scores: Quality of Life - 09/17/19 1440      Quality of Life   Select  Quality of Life      Quality of Life Scores   Health/Function Pre  24.21 %    Socioeconomic Pre  27.92 %    Psych/Spiritual Pre  27.07 %    Family Pre  29.38 %    GLOBAL Pre  26.24 %      Scores of 19 and below usually indicate a poorer quality of life in these areas.  A difference of  2-3 points is a clinically meaningful difference.  A difference of 2-3 points in the total score of the Quality of Life Index has been associated with significant improvement in overall quality of life, self-image, physical symptoms, and general health in studies assessing change in quality of life.  PHQ-9: Recent Review Flowsheet Data    Depression screen [facility] 2/9 10/15/2019 09/17/2019 09/02/2019 07/27/2019 04/29/2019   Decreased Interest 0 0 0 0 0   Down, Depressed, Hopeless 0 0 0 0 0   PHQ - 2 Score 0 0 0 0 0   Altered sleeping 1 - 0 2 -   Tired, decreased energy 0 - 1 0 -   Change in appetite 0 - 0 0 -   Feeling bad or failure about yourself  0 - 0 0 -   Trouble concentrating 0 - 0 0 -   Moving slowly or fidgety/restless 0 - 0 0 -   Suicidal thoughts 0 - 0 0 -   PHQ-9 Score 1 - 1 2 -   Difficult doing work/chores Not difficult at all - Not difficult at all Not difficult at all -     Interpretation of Total Score  Total Score Depression Severity:  1-4 = Minimal depression, 5-9 = Mild depression, 10-14 = Moderate depression, 15-19 = Moderately severe depression, 20-27 = Severe depression   Psychosocial Evaluation and Intervention: Psychosocial Evaluation - 10/19/19 1126      Psychosocial Evaluation & Interventions   Interventions  Encouraged to  exercise with the program and follow exercise prescription    Comments  No psychococial barriers to self health management identified during telephone conversations with  CR staff.    Expected Outcomes  Patient will continue to have a positive outlook and attitude.    Continue Psychosocial Services   No Follow up required       Psychosocial Re-Evaluation:   Psychosocial Discharge (Final Psychosocial Re-Evaluation):   Vocational Rehabilitation: Provide vocational rehab assistance to qualifying candidates.   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      Education: Education Goals: Education classes will be provided on a weekly basis, covering required topics. Participant will state understanding/return demonstration of topics presented.  Learning Barriers/Preferences: Learning Barriers/Preferences - 09/17/19 1440      Learning Barriers/Preferences   Learning Barriers  Sight    Learning Preferences  Written Material;Verbal Instruction;Skilled Demonstration       Education Topics: Count Your Pulse:  -Group instruction provided by verbal instruction, demonstration, patient participation and written materials to support subject.  Instructors address importance of being able to find your pulse and how to count your pulse when at home without a heart monitor.  Patients get hands on experience counting their pulse with staff help and individually.   Heart Attack, Angina, and Risk Factor Modification:  -Group instruction provided by verbal instruction, video, and written materials to support subject.  Instructors address signs and symptoms of angina and heart attacks.    Also discuss risk factors for heart disease and how to make changes to improve heart health risk factors.   Functional Fitness:  -Group instruction provided by verbal instruction, demonstration, patient participation, and written materials to support  subject.  Instructors address safety measures for doing things around the house.  Discuss how to get up and down off the floor, how to pick things up properly, how to safely get out of a chair without assistance, and balance training.   Meditation and Mindfulness:  -Group instruction provided by verbal instruction, patient participation, and written materials to support subject.  Instructor addresses importance of mindfulness and meditation practice to help reduce stress and improve awareness.  Instructor also leads participants through a meditation exercise.    Stretching for Flexibility and Mobility:  -Group instruction provided by verbal instruction, patient participation, and written materials to support subject.  Instructors lead participants through series of stretches that are designed to increase flexibility thus improving mobility.  These stretches are additional exercise for major muscle groups that are typically performed during regular warm up and cool down.   Hands Only CPR:  -Group verbal, video, and participation provides a basic overview of AHA guidelines for community CPR. Role-play of emergencies allow participants the opportunity to practice calling for help and chest compression technique with discussion of AED use.   Hypertension: -Group verbal and written instruction that provides a basic overview of hypertension including the most recent diagnostic guidelines, risk factor reduction with self-care instructions and medication management.    Nutrition I class: Heart Healthy Eating:  -Group instruction provided by PowerPoint slides, verbal discussion, and written materials to support subject matter. The instructor gives an explanation and review of the Therapeutic Lifestyle Changes diet recommendations, which includes a discussion on lipid goals, dietary fat, sodium, fiber,  plant stanol/sterol esters, sugar, and the components of a well-balanced, healthy diet.   Nutrition  II class: Lifestyle Skills:  -Group instruction provided by PowerPoint slides, verbal discussion, and written materials to support subject matter. The instructor gives an explanation and review of label reading, grocery shopping for heart health, heart healthy recipe modifications, and ways to make healthier choices when eating out.   Diabetes Question & Answer:  -Group instruction provided by PowerPoint slides, verbal discussion, and written materials to support subject matter. The instructor gives an explanation and review of diabetes co-morbidities, pre- and post-prandial blood glucose goals, pre-exercise blood glucose goals, signs, symptoms, and treatment of hypoglycemia and hyperglycemia, and foot care basics.   Diabetes Blitz:  -Group instruction provided by PowerPoint slides, verbal discussion, and written materials to support subject matter. The instructor gives an explanation and review of the physiology behind type 1 and type 2 diabetes, diabetes medications and rational behind using different medications, pre- and post-prandial blood glucose recommendations and Hemoglobin A1c goals, diabetes diet, and exercise including blood glucose guidelines for exercising safely.    Portion Distortion:  -Group instruction provided by PowerPoint slides, verbal discussion, written materials, and food models to support subject matter. The instructor gives an explanation of serving size versus portion size, changes in portions sizes over the last 20 years, and what consists of a serving from each food group.   Stress Management:  -Group instruction provided by verbal instruction, video, and written materials to support subject matter.  Instructors review role of stress in heart disease and how to cope with stress positively.     Exercising on Your Own:  -Group instruction provided by verbal instruction, power point, and written materials to support subject.  Instructors discuss benefits of exercise,  components of exercise, frequency and intensity of exercise, and end points for exercise.  Also discuss use of nitroglycerin and activating EMS.  Review options of places to exercise outside of rehab.  Review guidelines for sex with heart disease.   Cardiac Drugs I:  -Group instruction provided by verbal instruction and written materials to support subject.  Instructor reviews cardiac drug classes: antiplatelets, anticoagulants, beta blockers, and statins.  Instructor discusses reasons, side effects, and lifestyle considerations for each drug class.   Cardiac Drugs II:  -Group instruction provided by verbal instruction and written materials to support subject.  Instructor reviews cardiac drug classes: angiotensin converting enzyme inhibitors (ACE-I), angiotensin II receptor blockers (ARBs), nitrates, and calcium channel blockers.  Instructor discusses reasons, side effects, and lifestyle considerations for each drug class.   Anatomy and Physiology of the Circulatory System:  Group verbal and written instruction and models provide basic cardiac anatomy and physiology, with the coronary electrical and arterial systems. Review of: AMI, Angina, Valve disease, Heart Failure, Peripheral Artery Disease, Cardiac Arrhythmia, Pacemakers, and the ICD.   Other Education:  -Group or individual verbal, written, or video instructions that support the educational goals of the cardiac rehab program.   Holiday Eating Survival Tips:  -Group instruction provided by PowerPoint slides, verbal discussion, and written materials to support subject matter. The instructor gives patients tips, tricks, and techniques to help them not only survive but enjoy the holidays despite the onslaught of food that accompanies the holidays.   Knowledge Questionnaire Score: Knowledge Questionnaire Score - 09/17/19 1439      Knowledge Questionnaire Score   Pre Score  21/24       Core Components/Risk Factors/Patient Goals at  Admission: Personal Goals and Risk Factors at  Admission - 09/17/19 1526      Core Components/Risk Factors/Patient Goals on Admission    Weight Management  Yes;Weight Maintenance    Intervention  Weight Management: Develop a combined nutrition and exercise program designed to reach desired caloric intake, while maintaining appropriate intake of nutrient and fiber, sodium and fats, and appropriate energy expenditure required for the weight goal.;Weight Management: Provide education and appropriate resources to help participant work on and attain dietary goals.;Weight Management/Obesity: Establish reasonable short term and long term weight goals.    Expected Outcomes  Short Term: Continue to assess and modify interventions until short term weight is achieved;Long Term: Adherence to nutrition and physical activity/exercise program aimed toward attainment of established weight goal;Weight Maintenance: Understanding of the daily nutrition guidelines, which includes 25-35% calories from fat, 7% or less cal from saturated fats, less than 200mg  cholesterol, less than 1.5gm of sodium, & 5 or more servings of fruits and vegetables daily;Understanding recommendations for meals to include 15-35% energy as protein, 25-35% energy from fat, 35-60% energy from carbohydrates, less than 200mg  of dietary cholesterol, 20-35 gm of total fiber daily;Understanding of distribution of calorie intake throughout the day with the consumption of 4-5 meals/snacks    Hypertension  Yes    Intervention  Provide education on lifestyle modifcations including regular physical activity/exercise, weight management, moderate sodium restriction and increased consumption of fresh fruit, vegetables, and low fat dairy, alcohol moderation, and smoking cessation.;Monitor prescription use compliance.    Expected Outcomes  Short Term: Continued assessment and intervention until BP is < 140/22mm HG in hypertensive participants. < 130/61mm HG in  hypertensive participants with diabetes, heart failure or chronic kidney disease.;Long Term: Maintenance of blood pressure at goal levels.    Lipids  Yes    Intervention  Provide education and support for participant on nutrition & aerobic/resistive exercise along with prescribed medications to achieve LDL 70mg , HDL >40mg .    Expected Outcomes  Short Term: Participant states understanding of desired cholesterol values and is compliant with medications prescribed. Participant is following exercise prescription and nutrition guidelines.;Long Term: Cholesterol controlled with medications as prescribed, with individualized exercise RX and with personalized nutrition plan. Value goals: LDL < 70mg , HDL > 40 mg.       Core Components/Risk Factors/Patient Goals Review:  Goals and Risk Factor Review    Row Name 10/19/19 1127             Core Components/Risk Factors/Patient Goals Review   Personal Goals Review  Weight Management/Obesity;Lipids;Hypertension       Review  Patient with multiple CAD risk factors.  Continues to participate in cardiac rehab plan of care/exercise utilizing the [facility] virtual cardiac rehab app.       Expected Outcomes  Patient will continue to participate in cardiac rehab for risk factor modification          Core Components/Risk Factors/Patient Goals at Discharge (Final Review):  Goals and Risk Factor Review - 10/19/19 1127      Core Components/Risk Factors/Patient Goals Review   Personal Goals Review  Weight Management/Obesity;Lipids;Hypertension    Review  Patient with multiple CAD risk factors.  Continues to participate in cardiac rehab plan of care/exercise utilizing the [facility] virtual cardiac rehab app.    Expected Outcomes  Patient will continue to participate in cardiac rehab for risk factor modification       ITP Comments: ITP Comments    Row Name 09/17/19 1433 10/19/19 1126         ITP  Comments  Dr. Fransico Him, Medical Director  30 day  ITP review: In-person Cardiac Rehab exercise session will resume. Patient will be contacted to see if interested. Currently patient is following their cardiac rehab plan of care utilizing the [facility] app.         Comments: see ITP comments

## 2019-10-21 ENCOUNTER — Ambulatory Visit (HOSPITAL_COMMUNITY): Payer: Medicare Other

## 2019-10-23 ENCOUNTER — Telehealth (HOSPITAL_COMMUNITY): Payer: Self-pay | Admitting: *Deleted

## 2019-10-23 ENCOUNTER — Ambulatory Visit (HOSPITAL_COMMUNITY): Payer: Medicare Other

## 2019-10-23 ENCOUNTER — Encounter (HOSPITAL_COMMUNITY)
Admission: RE | Admit: 2019-10-23 | Discharge: 2019-10-23 | Disposition: A | Discharge status: Home / Self Care | Payer: Medicare PPO | Source: Ambulatory Visit | Attending: Internal Medicine | Admitting: Internal Medicine

## 2019-10-23 ENCOUNTER — Other Ambulatory Visit: Payer: Self-pay

## 2019-10-23 DIAGNOSIS — Z955 Presence of coronary angioplasty implant and graft: Secondary | ICD-10-CM | POA: Insufficient documentation

## 2019-10-23 DIAGNOSIS — I214 Non-ST elevation (NSTEMI) myocardial infarction: Secondary | ICD-10-CM | POA: Insufficient documentation

## 2019-10-23 NOTE — Telephone Encounter (Signed)
Received call from [name] seeking help with her virtual app [facility] for cardiac rehab.  Pt reports that she was able to log her information in one day and the next day she could not.  The information she entered resulted in error message.  Attempted several times over the phone with pt with no success.  [name] is coming in on Wednesday for her onsite exercise.  Asked her to please bring her smart device so that we can look at what she sees in hopes to be able to troubleshoot the issue.  Meanwhile I have sent a email to support desk at [employer]. [name] Huger, BSN Cardiac and Training and development officer

## 2019-10-26 ENCOUNTER — Ambulatory Visit: Payer: Medicare PPO | Attending: Internal Medicine

## 2019-10-26 ENCOUNTER — Ambulatory Visit (HOSPITAL_COMMUNITY): Payer: Medicare Other

## 2019-10-26 DIAGNOSIS — Z23 Encounter for immunization: Secondary | ICD-10-CM | POA: Insufficient documentation

## 2019-10-26 NOTE — Progress Notes (Signed)
   Covid-19 Vaccination Clinic  Name:  [name]    MRN: [id number] DOB: 05-08-1941  10/26/2019  [name] was observed post Covid-19 immunization for 15 minutes without incidence. She was provided with Vaccine Information Sheet and instruction to access the V-Safe system.   [name] was instructed to call 911 with any severe reactions post vaccine: Marland Kitchen Difficulty breathing  . Swelling of your face and throat  . A fast heartbeat  . A bad rash all over your body  . Dizziness and weakness    Immunizations Administered    Name Date Dose VIS Date Route   Pfizer COVID-19 Vaccine 10/26/2019  8:55 AM 0.3 mL 08/21/2019 Intramuscular   Manufacturer: [place]   Lot: [id number]   [place]: [id number]

## 2019-10-28 ENCOUNTER — Encounter (HOSPITAL_COMMUNITY)
Admission: RE | Admit: 2019-10-28 | Discharge: 2019-10-28 | Disposition: A | Discharge status: Home / Self Care | Payer: Medicare PPO | Source: Ambulatory Visit | Attending: Internal Medicine | Admitting: Internal Medicine

## 2019-10-28 ENCOUNTER — Ambulatory Visit (HOSPITAL_COMMUNITY): Payer: Medicare Other

## 2019-10-28 ENCOUNTER — Other Ambulatory Visit: Payer: Self-pay

## 2019-10-28 DIAGNOSIS — Z955 Presence of coronary angioplasty implant and graft: Secondary | ICD-10-CM

## 2019-10-28 DIAGNOSIS — I214 Non-ST elevation (NSTEMI) myocardial infarction: Secondary | ICD-10-CM

## 2019-10-28 NOTE — Progress Notes (Signed)
Daily Session Note  Patient Details  Name: [name] MRN: [id number] Date of Birth: 10/20/1940 Referring Provider:     CARDIAC REHAB PHASE II ORIENTATION from 09/17/2019 in [facility]  Referring Provider  Ross, Paula V MD       Encounter Date: 10/28/2019  Check In: Session Check In - 10/28/19 1403      Check-In   Supervising physician immediately available to respond to emergencies  [facility] immediately available    Physician(s)  Dr. Gonfa    Location  [facility]    Staff Present  Tara Everett, RN, BSN;Olinty Richards, MS, ACSM CEP, Exercise Physiologist;Portia Payne, RN, BSN;Brittany Vance, BS, ACSM CEP, Exercise Physiologist    Virtual Visit  No    Medication changes reported      No    Fall or balance concerns reported     No    Tobacco Cessation  No Change    Warm-up and Cool-down  Performed on first and last piece of equipment    Resistance Training Performed  No    VAD Patient?  No    PAD/SET Patient?  No      Pain Assessment   Currently in Pain?  No/denies    Pain Score  0-No pain    Multiple Pain Sites  No       Capillary Blood Glucose: No results found for this or any previous visit (from the past 24 hour(s)).    Social History   Tobacco Use  Smoking Status Never Smoker  Smokeless Tobacco Never Used    Goals Met:  Exercise tolerated well  Goals Unmet:  Not Applicable  Comments: Pt started cardiac rehab today.  Pt tolerated light exercise without difficulty. VSS, telemetry-SR, asymptomatic.  Medication list reconciled. Pt denies barriers to medicaiton compliance.  PSYCHOSOCIAL ASSESSMENT:  PHQ-0. Pt exhibits positive coping skills, hopeful outlook with supportive family. No psychosocial needs identified at this time, no psychosocial interventions necessary.   Pt oriented to exercise equipment and routine.    Understanding verbalized.    Dr. Traci Turner is Medical Director for Cardiac  Rehab at [facility]. 

## 2019-10-30 ENCOUNTER — Other Ambulatory Visit: Payer: Self-pay

## 2019-10-30 ENCOUNTER — Ambulatory Visit (HOSPITAL_COMMUNITY): Payer: Medicare Other

## 2019-10-30 ENCOUNTER — Encounter (HOSPITAL_COMMUNITY)
Admission: RE | Admit: 2019-10-30 | Discharge: 2019-10-30 | Disposition: A | Discharge status: Home / Self Care | Payer: Medicare PPO | Source: Ambulatory Visit | Attending: Internal Medicine | Admitting: Internal Medicine

## 2019-10-30 DIAGNOSIS — Z955 Presence of coronary angioplasty implant and graft: Secondary | ICD-10-CM | POA: Diagnosis not present

## 2019-10-30 DIAGNOSIS — I214 Non-ST elevation (NSTEMI) myocardial infarction: Secondary | ICD-10-CM

## 2019-11-02 ENCOUNTER — Ambulatory Visit (HOSPITAL_COMMUNITY): Payer: Medicare Other

## 2019-11-02 ENCOUNTER — Other Ambulatory Visit: Payer: Self-pay

## 2019-11-02 ENCOUNTER — Encounter (HOSPITAL_COMMUNITY)
Admission: RE | Admit: 2019-11-02 | Discharge: 2019-11-02 | Disposition: A | Discharge status: Home / Self Care | Payer: Medicare PPO | Source: Ambulatory Visit | Attending: Internal Medicine | Admitting: Internal Medicine

## 2019-11-02 DIAGNOSIS — I214 Non-ST elevation (NSTEMI) myocardial infarction: Secondary | ICD-10-CM

## 2019-11-02 DIAGNOSIS — Z955 Presence of coronary angioplasty implant and graft: Secondary | ICD-10-CM | POA: Diagnosis not present

## 2019-11-03 ENCOUNTER — Telehealth: Payer: Self-pay | Admitting: Pharmacist

## 2019-11-03 NOTE — Telephone Encounter (Signed)
Patient would like to try Repatha instead of Praluent due to itching/rash she had with Praluent. She would prefer to do injection in the office. We will supply Repatha sample for patient. She will come at 2:45 on Friday 2/26 for Repatha injection.  If patient does ok with injection, then will submit PA for Repatha

## 2019-11-04 ENCOUNTER — Ambulatory Visit (HOSPITAL_COMMUNITY): Payer: Medicare Other

## 2019-11-04 ENCOUNTER — Other Ambulatory Visit: Payer: Self-pay

## 2019-11-04 ENCOUNTER — Encounter (HOSPITAL_COMMUNITY)
Admission: RE | Admit: 2019-11-04 | Discharge: 2019-11-04 | Disposition: A | Discharge status: Home / Self Care | Payer: Medicare PPO | Source: Ambulatory Visit | Attending: Internal Medicine | Admitting: Internal Medicine

## 2019-11-04 DIAGNOSIS — Z955 Presence of coronary angioplasty implant and graft: Secondary | ICD-10-CM | POA: Diagnosis not present

## 2019-11-04 DIAGNOSIS — I214 Non-ST elevation (NSTEMI) myocardial infarction: Secondary | ICD-10-CM | POA: Diagnosis not present

## 2019-11-06 ENCOUNTER — Other Ambulatory Visit: Payer: Self-pay

## 2019-11-06 ENCOUNTER — Other Ambulatory Visit: Payer: Self-pay | Admitting: Pharmacist

## 2019-11-06 ENCOUNTER — Encounter (HOSPITAL_COMMUNITY)
Admission: RE | Admit: 2019-11-06 | Discharge: 2019-11-06 | Disposition: A | Discharge status: Home / Self Care | Payer: Medicare PPO | Source: Ambulatory Visit | Attending: Internal Medicine | Admitting: Internal Medicine

## 2019-11-06 ENCOUNTER — Ambulatory Visit (HOSPITAL_COMMUNITY): Payer: Medicare Other

## 2019-11-06 DIAGNOSIS — I214 Non-ST elevation (NSTEMI) myocardial infarction: Secondary | ICD-10-CM | POA: Diagnosis not present

## 2019-11-06 DIAGNOSIS — Z955 Presence of coronary angioplasty implant and graft: Secondary | ICD-10-CM | POA: Diagnosis not present

## 2019-11-06 MED ORDER — REPATHA SURECLICK 140 MG/ML ~~LOC~~ SOAJ: 1.0000 "pen " | SUBCUTANEOUS | 11 refills | Status: DC

## 2019-11-06 NOTE — Telephone Encounter (Signed)
Patient successfully injected Repatha 140mg  into the abdomen  Lot [id number] Exp 01/23 No injection site reaction occurred. Patient waited in my office for about 25 min without any issues. PA for Repatha submitted and approved Rx sent to [facility]

## 2019-11-06 NOTE — Addendum Note (Signed)
Addended by: Marcelle Overlie D on: 11/06/2019 03:27 PM   Modules accepted: Orders

## 2019-11-09 ENCOUNTER — Encounter (HOSPITAL_COMMUNITY): Payer: Medicare PPO

## 2019-11-09 ENCOUNTER — Ambulatory Visit (HOSPITAL_COMMUNITY): Payer: Medicare Other

## 2019-11-09 NOTE — Telephone Encounter (Signed)
Thanks for update and for helping her!

## 2019-11-11 ENCOUNTER — Ambulatory Visit (HOSPITAL_COMMUNITY): Payer: Medicare Other

## 2019-11-11 ENCOUNTER — Encounter (HOSPITAL_COMMUNITY)
Admission: RE | Admit: 2019-11-11 | Discharge: 2019-11-11 | Disposition: A | Discharge status: Home / Self Care | Payer: Medicare PPO | Source: Ambulatory Visit | Attending: Internal Medicine | Admitting: Internal Medicine

## 2019-11-11 DIAGNOSIS — Z955 Presence of coronary angioplasty implant and graft: Secondary | ICD-10-CM | POA: Insufficient documentation

## 2019-11-11 DIAGNOSIS — I214 Non-ST elevation (NSTEMI) myocardial infarction: Secondary | ICD-10-CM | POA: Insufficient documentation

## 2019-11-13 ENCOUNTER — Encounter (HOSPITAL_COMMUNITY)
Admission: RE | Admit: 2019-11-13 | Discharge: 2019-11-13 | Disposition: A | Discharge status: Home / Self Care | Payer: Medicare PPO | Source: Ambulatory Visit | Attending: Internal Medicine | Admitting: Internal Medicine

## 2019-11-13 ENCOUNTER — Ambulatory Visit (HOSPITAL_COMMUNITY): Payer: Medicare Other

## 2019-11-13 ENCOUNTER — Other Ambulatory Visit: Payer: Self-pay

## 2019-11-13 DIAGNOSIS — I214 Non-ST elevation (NSTEMI) myocardial infarction: Secondary | ICD-10-CM | POA: Diagnosis not present

## 2019-11-13 DIAGNOSIS — Z955 Presence of coronary angioplasty implant and graft: Secondary | ICD-10-CM | POA: Diagnosis not present

## 2019-11-16 ENCOUNTER — Other Ambulatory Visit: Payer: Self-pay

## 2019-11-16 ENCOUNTER — Encounter (HOSPITAL_COMMUNITY)
Admission: RE | Admit: 2019-11-16 | Discharge: 2019-11-16 | Disposition: A | Discharge status: Home / Self Care | Payer: Medicare PPO | Source: Ambulatory Visit | Attending: Internal Medicine | Admitting: Internal Medicine

## 2019-11-16 DIAGNOSIS — I214 Non-ST elevation (NSTEMI) myocardial infarction: Secondary | ICD-10-CM | POA: Diagnosis not present

## 2019-11-16 DIAGNOSIS — Z955 Presence of coronary angioplasty implant and graft: Secondary | ICD-10-CM

## 2019-11-18 ENCOUNTER — Other Ambulatory Visit: Payer: Self-pay

## 2019-11-18 ENCOUNTER — Encounter (HOSPITAL_COMMUNITY)
Admission: RE | Admit: 2019-11-18 | Discharge: 2019-11-18 | Disposition: A | Discharge status: Home / Self Care | Payer: Medicare PPO | Source: Ambulatory Visit | Attending: Internal Medicine | Admitting: Internal Medicine

## 2019-11-18 DIAGNOSIS — Z955 Presence of coronary angioplasty implant and graft: Secondary | ICD-10-CM | POA: Diagnosis not present

## 2019-11-18 DIAGNOSIS — I214 Non-ST elevation (NSTEMI) myocardial infarction: Secondary | ICD-10-CM | POA: Diagnosis not present

## 2019-11-19 NOTE — Progress Notes (Signed)
Cardiac Individual Treatment Plan  Patient Details  Name: [name] MRN: [id number] Date of Birth: 01-Feb-1941 Referring Provider:     CARDIAC REHAB PHASE II ORIENTATION from 09/17/2019 in [place]  Referring Provider  Fay Records MD       Initial Encounter Date:    CARDIAC REHAB PHASE II ORIENTATION from 09/17/2019 in [place]  Date  09/17/19      Visit Diagnosis: NSTEMI (non-ST elevated myocardial infarction) ([place]) 05/31/19  Status post coronary artery stent placement 06/01/19 S/P DES CFX  Patient's Home Medications on Admission:  Current Outpatient Medications:  .  AMBULATORY NON FORMULARY MEDICATION, Take 180 mg by mouth daily. Medication Name: 180 mg bempedoic acid vs a placebo, CLEAR Research Study drug provided, Disp: , Rfl:  .  amLODipine (NORVASC) 2.5 MG tablet, Take 1 tablet (2.5 mg total) by mouth daily., Disp: 90 tablet, Rfl: 3 .  aspirin EC 81 MG tablet, Take 81 mg by mouth daily., Disp: , Rfl:  .  Bimatoprost (LUMIGAN OP), Place 1 drop into both eyes at bedtime. , Disp: , Rfl:  .  chlorthalidone (HYGROTON) 25 MG tablet, TAKE 1 TABLET DAILY., Disp: 90 tablet, Rfl: 3 .  Cholecalciferol (VITAMIN D-3) 5000 UNITS TABS, Take 5,000 mg by mouth daily., Disp: , Rfl:  .  dorzolamide-timolol (COSOPT) 22.3-6.8 MG/ML ophthalmic solution, Place 1 drop into both eyes 2 (two) times daily. , Disp: , Rfl:  .  Evolocumab (REPATHA SURECLICK) 811 MG/ML SOAJ, Inject 1 pen into the skin every 14 (fourteen) days., Disp: 2 pen, Rfl: 11 .  ezetimibe (ZETIA) 10 MG tablet, TAKE 1 TABLET EACH DAY., Disp: 90 tablet, Rfl: 0 .  famotidine (PEPCID) 20 MG tablet, Take 1 tablet (20 mg total) by mouth 2 (two) times daily., Disp: 180 tablet, Rfl: 3 .  Icosapent Ethyl (VASCEPA) 1 g CAPS, Take 2 capsules (2 g total) by mouth 2 (two) times daily., Disp: 120 capsule, Rfl: 11 .  levothyroxine (SYNTHROID) 50 MCG tablet, Take 1 tablet (50  mcg total) by mouth daily before breakfast., Disp: 90 tablet, Rfl: 3 .  metoprolol tartrate (LOPRESSOR) 25 MG tablet, Take 1 tablet (25 mg total) by mouth 2 (two) times daily., Disp: 180 tablet, Rfl: 3 .  nitroGLYCERIN (NITROSTAT) 0.4 MG SL tablet, Place 1 tablet (0.4 mg total) under the tongue every 5 (five) minutes x 3 doses as needed for chest pain., Disp: 25 tablet, Rfl: 12 .  pilocarpine (PILOCAR) 1 % ophthalmic solution, Place 1 drop into both eyes 2 (two) times daily. , Disp: , Rfl:  .  Polyethyl Glycol-Propyl Glycol (SYSTANE OP), Place 1-2 drops into both eyes as needed (for dry eyes). , Disp: , Rfl:  .  Probiotic Product (PROBIOTIC DAILY) CAPS, Take 1 capsule by mouth daily. Reported on 10/10/2015, Disp: , Rfl:  .  ticagrelor (BRILINTA) 90 MG TABS tablet, Take 1 tablet (90 mg total) by mouth 2 (two) times daily., Disp: 60 tablet, Rfl: 12  Past Medical History: Past Medical History:  Diagnosis Date  . Abnormal thyroid function test 07/01/2013  . Anxiety   . Arthritis   . Cancer ([place])   . Coronary artery disease   . Dermatitis 11/25/2013  . Glaucoma 11/02/2012  . HTN (hypertension) 11/02/2012  . 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 Hyperlipidemia   . Hypothyroid   . Obesity     Tobacco Use: Social  History   Tobacco Use  Smoking Status Never Smoker  Smokeless Tobacco Never Used    Labs: Recent Review Flowsheet Data    Labs for ITP Cardiac and Pulmonary Rehab Latest Ref Rng & Units 04/21/2019 04/29/2019 06/01/2019 09/02/2019 09/21/2019   Cholestrol 100 - 199 mg/dL - - 198 105 112   LDLCALC 0 - 99 mg/dL - - 128(H) 31 40   LDLDIRECT mg/dL - 114.0 - - -   HDL >39 mg/dL - - 50 52.60 56   Trlycerides 0 - 149 mg/dL - - 98 109.0 77   Hemoglobin A1c - 5.5 - - - -      Capillary Blood Glucose: No results found for: GLUCAP   Exercise Target Goals: Exercise Program Goal: Individual exercise prescription set using results from initial 6  min walk test and THRR while considering  patient's activity barriers and safety.   Exercise Prescription Goal: Initial exercise prescription builds to 30-45 minutes a day of aerobic activity, 2-3 days per week.  Home exercise guidelines will be given to patient during program as part of exercise prescription that the participant will acknowledge.  Activity Barriers & Risk Stratification: Activity Barriers & Cardiac Risk Stratification - 09/17/19 1445      Activity Barriers & Cardiac Risk Stratification   Activity Barriers  Arthritis    Cardiac Risk Stratification  High       6 Minute Walk: 6 Minute Walk    Row Name 09/17/19 1442         6 Minute Walk   Phase  Initial     Distance  1095 feet     Walk Time  0 minutes     # of Rest Breaks  0     MPH  2.07     METS  1.98     RPE  13     Perceived Dyspnea   0     VO2 Peak  6.9     Symptoms  No     Resting HR  59 bpm     Resting BP  108/82     Resting Oxygen Saturation   97 %     Exercise Oxygen Saturation  during 6 min walk  97 %     Max Ex. HR  86 bpm     Max Ex. BP  138/84     2 Minute Post BP  110/70        Oxygen Initial Assessment:   Oxygen Re-Evaluation:   Oxygen Discharge (Final Oxygen Re-Evaluation):   Initial Exercise Prescription: Initial Exercise Prescription - 09/17/19 1400      Date of Initial Exercise RX and Referring Provider   Date  09/17/19    Referring Provider  Fay Records MD       Bike   Minutes  30      Track   Minutes  15      Prescription Details   Frequency (times per week)  5    Duration  Progress to 30 minutes of continuous aerobic without signs/symptoms of physical distress      Intensity   THRR 40-80% of Max Heartrate  57-114    Ratings of Perceived Exertion  11-13    Perceived Dyspnea  0-4      Progression   Progression  Continue progressive overload as per policy without signs/symptoms or physical distress.      Resistance Training   Training Prescription  No  Perform Capillary Blood Glucose checks as needed.  Exercise Prescription Changes: Exercise Prescription Changes    Row Name 10/09/19 1140 10/28/19 1400 11/16/19 1330         Response to Exercise   Blood Pressure (Admit)  123/66  132/60  104/70     Blood Pressure (Exercise)  --  128/76  122/66     Blood Pressure (Exit)  --  112/60  102/58     Heart Rate (Admit)  --  77 bpm  75 bpm     Heart Rate (Exercise)  --  95 bpm  107 bpm     Heart Rate (Exit)  --  69 bpm  75 bpm     Rating of Perceived Exertion (Exercise)  --  14  13     Symptoms  none  None  None     Comments  Virtual cardiac rehab session  Pt first day of exercise.   --     Duration  Continue with 30 min of aerobic exercise without signs/symptoms of physical distress.  Continue with 30 min of aerobic exercise without signs/symptoms of physical distress.  Continue with 30 min of aerobic exercise without signs/symptoms of physical distress.     Intensity  THRR unchanged  THRR unchanged  THRR unchanged       Progression   Progression  Continue to progress workloads to maintain intensity without signs/symptoms of physical distress.  Continue to progress workloads to maintain intensity without signs/symptoms of physical distress.  Continue to progress workloads to maintain intensity without signs/symptoms of physical distress.     Average METs  --  2.1  2.1       Resistance Training   Training Prescription  No  No  Yes     Weight  --  --  3 lbs.      Reps  --  --  10-15     Time  --  --  10 Minutes       Interval Training   Interval Training  --  --  No       Bike   Minutes  30 3 miles  --  --       Recumbant Bike   Level  --  --  2     Minutes  --  --  10     METs  --  --  2.3       NuStep   Level  --  2  2     SPM  --  85  85     Minutes  --  15  15     METs  --  2.2  1.9       Arm Ergometer   Level  --  2  --     Minutes  --  15  --     METs  --  2  --       Track   Minutes  --  --  --       Home  Exercise Plan   Plans to continue exercise at  Home (comment) Stationary bike  --  Home (comment)     Frequency  --  --  Add 3 additional days to program exercise sessions.     Initial Home Exercises Provided  09/17/19  --  09/17/19        Exercise Comments: Exercise Comments    Row Name 10/19/19 1143 10/28/19 1400 11/18/19 1330         Exercise Comments  Patient will be contacted to begin participation in the onsite cardiac rehab program after temporary closure due to COVID-19 pandemic.  Pt first day of exercise. Pt toelrated exercise well.  Reviewed METs and goals with Pt. Pt is progressing well and understands home exercise goals.        Exercise Goals and Review:   Exercise Goals Re-Evaluation : Exercise Goals Re-Evaluation    Row Name 10/19/19 1142 11/02/19 0826 11/18/19 1330         Exercise Goal Re-Evaluation   Exercise Goals Review  --  Increase Physical Activity;Increase Strength and Stamina;Able to understand and use rate of perceived exertion (RPE) scale;Knowledge and understanding of Target Heart Rate Range (THRR);Able to check pulse independently;Understanding of Exercise Prescription  Increase Physical Activity;Increase Strength and Stamina;Able to understand and use rate of perceived exertion (RPE) scale;Knowledge and understanding of Target Heart Rate Range (THRR);Able to check pulse independently;Understanding of Exercise Prescription     Comments  Patient has been actively participating in the virtual cardiac rehab program via the [employer] app and has been progressing well. Patient is riding her stationary bike 30 minutes 4-5 days/week.  Pt first day of exercise in CR program. Pt tolerated exercise well. Pt understands THRR, RPE scale, and exercise Rx. Pt is continuing to use the Virtual CR program as well.  Reviewed METs and goals with Pt. Pt MET average is 2.4. Pt is progressing well in the program and continues to exercise at home by using her [employer] bike for 20  minutes 2-4 days per week. Pt has not been logging this in the Virtual CR program but stated she would start doing so.     Expected Outcomes  Patient will transition to the onsite cardiac rehab program if interested.  Will continue to monitor and progress Pt as tolerated.  Will continue to monitor and progress Pt as tolerated.        Discharge Exercise Prescription (Final Exercise Prescription Changes): Exercise Prescription Changes - 11/16/19 1330      Response to Exercise   Blood Pressure (Admit)  104/70    Blood Pressure (Exercise)  122/66    Blood Pressure (Exit)  102/58    Heart Rate (Admit)  75 bpm    Heart Rate (Exercise)  107 bpm    Heart Rate (Exit)  75 bpm    Rating of Perceived Exertion (Exercise)  13    Symptoms  None    Duration  Continue with 30 min of aerobic exercise without signs/symptoms of physical distress.    Intensity  THRR unchanged      Progression   Progression  Continue to progress workloads to maintain intensity without signs/symptoms of physical distress.    Average METs  2.1      Resistance Training   Training Prescription  Yes    Weight  3 lbs.     Reps  10-15    Time  10 Minutes      Interval Training   Interval Training  No      Recumbant Bike   Level  2    Minutes  10    METs  2.3      NuStep   Level  2    SPM  85    Minutes  15    METs  1.9      Home Exercise Plan   Plans to continue exercise at  Home (comment)    Frequency  Add 3 additional days to  program exercise sessions.    Initial Home Exercises Provided  09/17/19       Nutrition:  Target Goals: Understanding of nutrition guidelines, daily intake of sodium <1500mg, cholesterol <200mg, calories 30% from fat and 7% or less from saturated fats, daily to have 5 or more servings of fruits and vegetables.  Biometrics: Pre Biometrics - 09/17/19 1445      Pre Biometrics   Height  5' 3" (1.6 m)    Weight  68.5 kg    Waist Circumference  40 inches    Hip Circumference  39  inches    Waist to Hip Ratio  1.03 %    BMI (Calculated)  26.76    Triceps Skinfold  16 mm    % Body Fat  38.3 %    Grip Strength  27 kg    Flexibility  13 in    Single Leg Stand  9.81 seconds        Nutrition Therapy Plan and Nutrition Goals: Nutrition Therapy & Goals - 11/13/19 0918      Nutrition Therapy   Diet  Heart Healthy      Personal Nutrition Goals   Nutrition Goal  Pt to maintain her current heart healthy diet    Personal Goal #2  Pt to maintain 30 lbs weight loss      Intervention Plan   Intervention  Prescribe, educate and counsel regarding individualized specific dietary modifications aiming towards targeted core components such as weight, hypertension, lipid management, diabetes, heart failure and other comorbidities.    Expected Outcomes  Long Term Goal: Adherence to prescribed nutrition plan.       Nutrition Assessments:   Nutrition Goals Re-Evaluation: Nutrition Goals Re-Evaluation    Row Name 11/13/19 1025             Goals   Current Weight  148 lb (67.1 kg)       Nutrition Goal  Pt to maintain her current heart healthy diet       Expected Outcome  Continue making heart healthy habits as she works with RD outside of [place]         Personal Goal #2 Re-Evaluation   Personal Goal #2  Pt to maintain 30 lbs weight loss          Nutrition Goals Re-Evaluation: Nutrition Goals Re-Evaluation    Row Name 11/13/19 1025             Goals   Current Weight  148 lb (67.1 kg)       Nutrition Goal  Pt to maintain her current heart healthy diet       Expected Outcome  Continue making heart healthy habits as she works with RD outside of [place]         Personal Goal #2 Re-Evaluation   Personal Goal #2  Pt to maintain 30 lbs weight loss          Nutrition Goals Discharge (Final Nutrition Goals Re-Evaluation): Nutrition Goals Re-Evaluation - 11/13/19 1025      Goals   Current Weight  148 lb (67.1 kg)    Nutrition Goal  Pt to maintain her  current heart healthy diet    Expected Outcome  Continue making heart healthy habits as she works with RD outside of [place]      Personal Goal #2 Re-Evaluation   Personal Goal #2  Pt to maintain 30 lbs weight loss       Psychosocial: Target Goals:   Acknowledge presence or absence of significant depression and/or stress, maximize coping skills, provide positive support system. Participant is able to verbalize types and ability to use techniques and skills needed for reducing stress and depression.  Initial Review & Psychosocial Screening: Initial Psych Review & Screening - 09/17/19 1524      Initial Review   Current issues with  None Identified      Family Dynamics   Good Support System?  Yes   Shelbi lives alone but has her daughter for support who lives in town     Barriers   Psychosocial barriers to participate in program  There are no identifiable barriers or psychosocial needs.      Screening Interventions   Interventions  Encouraged to exercise       Quality of Life Scores: Quality of Life - 09/17/19 1440      Quality of Life   Select  Quality of Life      Quality of Life Scores   Health/Function Pre  24.21 %    Socioeconomic Pre  27.92 %    Psych/Spiritual Pre  27.07 %    Family Pre  29.38 %    GLOBAL Pre  26.24 %      Scores of 19 and below usually indicate a poorer quality of life in these areas.  A difference of  2-3 points is a clinically meaningful difference.  A difference of 2-3 points in the total score of the Quality of Life Index has been associated with significant improvement in overall quality of life, self-image, physical symptoms, and general health in studies assessing change in quality of life.  PHQ-9: Recent Review Flowsheet Data    Depression screen PHQ 2/9 10/15/2019 09/17/2019 09/02/2019 07/27/2019 04/29/2019   Decreased Interest 0 0 0 0 0   Down, Depressed, Hopeless 0 0 0 0 0   PHQ - 2 Score 0 0 0 0 0   Altered sleeping 1 - 0 2 -   Tired,  decreased energy 0 - 1 0 -   Change in appetite 0 - 0 0 -   Feeling bad or failure about yourself  0 - 0 0 -   Trouble concentrating 0 - 0 0 -   Moving slowly or fidgety/restless 0 - 0 0 -   Suicidal thoughts 0 - 0 0 -   PHQ-9 Score 1 - 1 2 -   Difficult doing work/chores Not difficult at all - Not difficult at all Not difficult at all -     Interpretation of Total Score  Total Score Depression Severity:  1-4 = Minimal depression, 5-9 = Mild depression, 10-14 = Moderate depression, 15-19 = Moderately severe depression, 20-27 = Severe depression   Psychosocial Evaluation and Intervention: Psychosocial Evaluation - 10/19/19 1126      Psychosocial Evaluation & Interventions   Interventions  Encouraged to exercise with the program and follow exercise prescription    Comments  No psychococial barriers to self health management identified during telephone conversations with CR staff.    Expected Outcomes  Patient will continue to have a positive outlook and attitude.    Continue Psychosocial Services   No Follow up required       Psychosocial Re-Evaluation: Psychosocial Re-Evaluation    Row Name 10/28/19 1719 11/18/19 1154           Psychosocial Re-Evaluation   Current issues with  None Identified  None Identified      Comments  No psychosocial needs identified.    No psychosocial needs identified.      Expected Outcomes  Torianne will maintain a positive outlook.  Tanvi will maintain a positive outlook.      Interventions  Encouraged to attend Cardiac Rehabilitation for the exercise  Encouraged to attend Cardiac Rehabilitation for the exercise      Continue Psychosocial Services   No Follow up required  No Follow up required         Psychosocial Discharge (Final Psychosocial Re-Evaluation): Psychosocial Re-Evaluation - 11/18/19 1154      Psychosocial Re-Evaluation   Current issues with  None Identified    Comments  No psychosocial needs identified.    Expected Outcomes  Mon[place]  will maintain a positive outlook.    Interventions  Encouraged to attend Cardiac Rehabilitation for the exercise    Continue Psychosocial Services   No Follow up required       Vocational Rehabilitation: Provide vocational rehab assistance to qualifying candidates.   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      Education: Education Goals: Education classes will be provided on a weekly basis, covering required topics. Participant will state understanding/return demonstration of topics presented.  Learning Barriers/Preferences: Learning Barriers/Preferences - 09/17/19 1440      Learning Barriers/Preferences   Learning Barriers  Sight    Learning Preferences  Written Material;Verbal Instruction;Skilled Demonstration       Education Topics: Count Your Pulse:  -Group instruction provided by verbal instruction, demonstration, patient participation and written materials to support subject.  Instructors address importance of being able to find your pulse and how to count your pulse when at home without a heart monitor.  Patients get hands on experience counting their pulse with staff help and individually.   Heart Attack, Angina, and Risk Factor Modification:  -Group instruction provided by verbal instruction, video, and written materials to support subject.  Instructors address signs and symptoms of angina and heart attacks.    Also discuss risk factors for heart disease and how to make changes to improve heart health risk factors.   Functional Fitness:  -Group instruction provided by verbal instruction, demonstration, patient participation, and written materials to support subject.  Instructors address safety measures for doing things around the house.   Discuss how to get up and down off the floor, how to pick things up properly, how to safely get out of a chair without assistance, and balance training.   Meditation and Mindfulness:  -Group instruction provided by verbal instruction, patient participation, and written materials to support subject.  Instructor addresses importance of mindfulness and meditation practice to help reduce stress and improve awareness.  Instructor also leads participants through a meditation exercise.    Stretching for Flexibility and Mobility:  -Group instruction provided by verbal instruction, patient participation, and written materials to support subject.  Instructors lead participants through series of stretches that are designed to increase flexibility thus improving mobility.  These stretches are additional exercise for major muscle groups that are typically performed during regular warm up and cool down.   Hands Only CPR:  -Group verbal, video, and participation provides a basic overview of AHA guidelines for community CPR. Role-play of emergencies allow participants the opportunity to practice calling for help and chest compression technique with discussion of AED   use.   Hypertension: -Group verbal and written instruction that provides a basic overview of hypertension including the most recent diagnostic guidelines, risk factor reduction with self-care instructions and medication management.    Nutrition I class: Heart Healthy Eating:  -Group instruction provided by PowerPoint slides, verbal discussion, and written materials to support subject matter. The instructor gives an explanation and review of the Therapeutic Lifestyle Changes diet recommendations, which includes a discussion on lipid goals, dietary fat, sodium, fiber, plant stanol/sterol esters, sugar, and the components of a well-balanced, healthy diet.   Nutrition II class: Lifestyle Skills:  -Group instruction provided by PowerPoint slides,  verbal discussion, and written materials to support subject matter. The instructor gives an explanation and review of label reading, grocery shopping for heart health, heart healthy recipe modifications, and ways to make healthier choices when eating out.   Diabetes Question & Answer:  -Group instruction provided by PowerPoint slides, verbal discussion, and written materials to support subject matter. The instructor gives an explanation and review of diabetes co-morbidities, pre- and post-prandial blood glucose goals, pre-exercise blood glucose goals, signs, symptoms, and treatment of hypoglycemia and hyperglycemia, and foot care basics.   Diabetes Blitz:  -Group instruction provided by PowerPoint slides, verbal discussion, and written materials to support subject matter. The instructor gives an explanation and review of the physiology behind type 1 and type 2 diabetes, diabetes medications and rational behind using different medications, pre- and post-prandial blood glucose recommendations and Hemoglobin A1c goals, diabetes diet, and exercise including blood glucose guidelines for exercising safely.    Portion Distortion:  -Group instruction provided by PowerPoint slides, verbal discussion, written materials, and food models to support subject matter. The instructor gives an explanation of serving size versus portion size, changes in portions sizes over the last 20 years, and what consists of a serving from each food group.   Stress Management:  -Group instruction provided by verbal instruction, video, and written materials to support subject matter.  Instructors review role of stress in heart disease and how to cope with stress positively.     Exercising on Your Own:  -Group instruction provided by verbal instruction, power point, and written materials to support subject.  Instructors discuss benefits of exercise, components of exercise, frequency and intensity of exercise, and end points for  exercise.  Also discuss use of nitroglycerin and activating EMS.  Review options of places to exercise outside of rehab.  Review guidelines for sex with heart disease.   Cardiac Drugs I:  -Group instruction provided by verbal instruction and written materials to support subject.  Instructor reviews cardiac drug classes: antiplatelets, anticoagulants, beta blockers, and statins.  Instructor discusses reasons, side effects, and lifestyle considerations for each drug class.   Cardiac Drugs II:  -Group instruction provided by verbal instruction and written materials to support subject.  Instructor reviews cardiac drug classes: angiotensin converting enzyme inhibitors (ACE-I), angiotensin II receptor blockers (ARBs), nitrates, and calcium channel blockers.  Instructor discusses reasons, side effects, and lifestyle considerations for each drug class.   Anatomy and Physiology of the Circulatory System:  Group verbal and written instruction and models provide basic cardiac anatomy and physiology, with the coronary electrical and arterial systems. Review of: AMI, Angina, Valve disease, Heart Failure, Peripheral Artery Disease, Cardiac Arrhythmia, Pacemakers, and the ICD.   Other Education:  -Group or individual verbal, written, or video instructions that support the educational goals of the cardiac rehab program.   Holiday Eating Survival Tips:  -Group instruction provided by PowerPoint  slides, verbal discussion, and written materials to support subject matter. The instructor gives patients tips, tricks, and techniques to help them not only survive but enjoy the holidays despite the onslaught of food that accompanies the holidays.   Knowledge Questionnaire Score: Knowledge Questionnaire Score - 09/17/19 1439      Knowledge Questionnaire Score   Pre Score  21/24       Core Components/Risk Factors/Patient Goals at Admission: Personal Goals and Risk Factors at Admission - 09/17/19 1526       Core Components/Risk Factors/Patient Goals on Admission    Weight Management  Yes;Weight Maintenance    Intervention  Weight Management: Develop a combined nutrition and exercise program designed to reach desired caloric intake, while maintaining appropriate intake of nutrient and fiber, sodium and fats, and appropriate energy expenditure required for the weight goal.;Weight Management: Provide education and appropriate resources to help participant work on and attain dietary goals.;Weight Management/Obesity: Establish reasonable short term and long term weight goals.    Expected Outcomes  Short Term: Continue to assess and modify interventions until short term weight is achieved;Long Term: Adherence to nutrition and physical activity/exercise program aimed toward attainment of established weight goal;Weight Maintenance: Understanding of the daily nutrition guidelines, which includes 25-35% calories from fat, 7% or less cal from saturated fats, less than 200mg cholesterol, less than 1.5gm of sodium, & 5 or more servings of fruits and vegetables daily;Understanding recommendations for meals to include 15-35% energy as protein, 25-35% energy from fat, 35-60% energy from carbohydrates, less than 200mg of dietary cholesterol, 20-35 gm of total fiber daily;Understanding of distribution of calorie intake throughout the day with the consumption of 4-5 meals/snacks    Hypertension  Yes    Intervention  Provide education on lifestyle modifcations including regular physical activity/exercise, weight management, moderate sodium restriction and increased consumption of fresh fruit, vegetables, and low fat dairy, alcohol moderation, and smoking cessation.;Monitor prescription use compliance.    Expected Outcomes  Short Term: Continued assessment and intervention until BP is < 140/90mm HG in hypertensive participants. < 130/80mm HG in hypertensive participants with diabetes, heart failure or chronic kidney disease.;Long  Term: Maintenance of blood pressure at goal levels.    Lipids  Yes    Intervention  Provide education and support for participant on nutrition & aerobic/resistive exercise along with prescribed medications to achieve LDL <70mg, HDL >40mg.    Expected Outcomes  Short Term: Participant states understanding of desired cholesterol values and is compliant with medications prescribed. Participant is following exercise prescription and nutrition guidelines.;Long Term: Cholesterol controlled with medications as prescribed, with individualized exercise RX and with personalized nutrition plan. Value goals: LDL < 70mg, HDL > 40 mg.       Core Components/Risk Factors/Patient Goals Review:  Goals and Risk Factor Review    Row Name 10/19/19 1127 11/18/19 1154           Core Components/Risk Factors/Patient Goals Review   Personal Goals Review  Weight Management/Obesity;Lipids;Hypertension  Weight Management/Obesity;Lipids;Hypertension      Review  Patient with multiple CAD risk factors.  Continues to participate in cardiac rehab plan of care/exercise utilizing the [facility] virtual cardiac rehab app.  Patient with multiple CAD risk factors.  Michell continues to tolerate exercise well.  She feels that she is increasing her strength.      Expected Outcomes  Patient will continue to participate in cardiac rehab for risk factor modification  Patient will continue to participate in cardiac rehab for risk factor modification to   prevent CV disease.         Core Components/Risk Factors/Patient Goals at Discharge (Final Review):  Goals and Risk Factor Review - 11/18/19 1154      Core Components/Risk Factors/Patient Goals Review   Personal Goals Review  Weight Management/Obesity;Lipids;Hypertension    Review  Patient with multiple CAD risk factors.  Ermalee continues to tolerate exercise well.  She feels that she is increasing her strength.    Expected Outcomes  Patient will continue to participate in cardiac  rehab for risk factor modification to prevent CV disease.       ITP Comments: ITP Comments    Row Name 09/17/19 1433 10/19/19 1126 10/28/19 1714 11/18/19 1153     ITP Comments  Dr. Traci Turner, Medical Director  30 day ITP review: In-person Cardiac Rehab exercise session will resume. Patient will be contacted to see if interested. Currently patient is following their cardiac rehab plan of care utilizing the [facility] app.  Pt started started exercise today and started it well.  30 Day ITP Review. Anslie is tolerating exercise well.  She feels that her strength is improving.       Comments: See ITP Comments. 

## 2019-11-20 ENCOUNTER — Other Ambulatory Visit: Payer: Self-pay

## 2019-11-20 ENCOUNTER — Encounter (HOSPITAL_COMMUNITY)
Admission: RE | Admit: 2019-11-20 | Discharge: 2019-11-20 | Disposition: A | Discharge status: Home / Self Care | Payer: Medicare PPO | Source: Ambulatory Visit | Attending: Internal Medicine | Admitting: Internal Medicine

## 2019-11-20 DIAGNOSIS — I214 Non-ST elevation (NSTEMI) myocardial infarction: Secondary | ICD-10-CM | POA: Diagnosis not present

## 2019-11-20 DIAGNOSIS — Z955 Presence of coronary angioplasty implant and graft: Secondary | ICD-10-CM | POA: Diagnosis not present

## 2019-11-23 ENCOUNTER — Other Ambulatory Visit: Payer: Self-pay

## 2019-11-23 ENCOUNTER — Encounter (HOSPITAL_COMMUNITY)
Admission: RE | Admit: 2019-11-23 | Discharge: 2019-11-23 | Disposition: A | Discharge status: Home / Self Care | Payer: Medicare PPO | Source: Ambulatory Visit | Attending: Internal Medicine | Admitting: Internal Medicine

## 2019-11-23 DIAGNOSIS — I214 Non-ST elevation (NSTEMI) myocardial infarction: Secondary | ICD-10-CM | POA: Diagnosis not present

## 2019-11-23 DIAGNOSIS — Z955 Presence of coronary angioplasty implant and graft: Secondary | ICD-10-CM | POA: Diagnosis not present

## 2019-11-24 ENCOUNTER — Telehealth: Payer: Self-pay | Admitting: Internal Medicine

## 2019-11-24 ENCOUNTER — Telehealth: Payer: Self-pay | Admitting: Pharmacist

## 2019-11-24 NOTE — Telephone Encounter (Signed)
Patient made aware of the prior authorization approval

## 2019-11-24 NOTE — Telephone Encounter (Signed)
Patient called stating she got a letter that Repatha was not the preferred agent. I have submitted a prior authorization again for Repatha and it was approved through 09/09/20. Called patient and LVM for her to return call to let her know.

## 2019-11-24 NOTE — Telephone Encounter (Signed)
   [facility] Pre-operative Risk Assessment    Request for surgical clearance:  1. What type of surgery is being performed? Dental Cleaning  2. When is this surgery scheduled? 12/03/19  3. What type of clearance is required (medical clearance vs. Pharmacy clearance to hold med vs. Both)? both  4. Are there any medications that need to be held prior to surgery and how long? N/a - calling to make sure patient does not need an antibiotic before cleaning.  5. Practice name and name of physician performing surgery? [facility]  6. What is your office phone number [phone]   7.   What is your office fax number [phone] ATTN to Tracey  8.   Anesthesia type (None, local, MAC, general) ? none   [name] 11/24/2019, 11:04 AM  _________________________________________________________________   (provider comments below)

## 2019-11-24 NOTE — Telephone Encounter (Signed)
   Primary Cardiologist: Dorris Carnes, MD  Chart reviewed as part of pre-operative protocol coverage. Given past medical history and time since last visit, based on ACC/AHA guidelines, [name] would be at acceptable risk for the planned procedure without further cardiovascular testing.   No hx of valvular heart disease. Patient does not antibiotic prophylaxis. Continue all home medication.  I will route this recommendation to the requesting party via Epic fax function and remove from pre-op pool.  Please call with questions.  [place], [place] 11/24/2019, 11:23 AM

## 2019-11-25 ENCOUNTER — Encounter (HOSPITAL_COMMUNITY)
Admission: RE | Admit: 2019-11-25 | Discharge: 2019-11-25 | Disposition: A | Discharge status: Home / Self Care | Payer: Medicare PPO | Source: Ambulatory Visit | Attending: Internal Medicine | Admitting: Internal Medicine

## 2019-11-25 ENCOUNTER — Other Ambulatory Visit: Payer: Self-pay

## 2019-11-25 DIAGNOSIS — Z955 Presence of coronary angioplasty implant and graft: Secondary | ICD-10-CM

## 2019-11-25 DIAGNOSIS — I214 Non-ST elevation (NSTEMI) myocardial infarction: Secondary | ICD-10-CM

## 2019-11-27 ENCOUNTER — Encounter (HOSPITAL_COMMUNITY)
Admission: RE | Admit: 2019-11-27 | Discharge: 2019-11-27 | Disposition: A | Discharge status: Home / Self Care | Payer: Medicare PPO | Source: Ambulatory Visit | Attending: Internal Medicine | Admitting: Internal Medicine

## 2019-11-27 ENCOUNTER — Other Ambulatory Visit: Payer: Self-pay

## 2019-11-27 DIAGNOSIS — Z955 Presence of coronary angioplasty implant and graft: Secondary | ICD-10-CM | POA: Diagnosis not present

## 2019-11-27 DIAGNOSIS — I214 Non-ST elevation (NSTEMI) myocardial infarction: Secondary | ICD-10-CM | POA: Diagnosis not present

## 2019-11-30 ENCOUNTER — Other Ambulatory Visit: Payer: Self-pay

## 2019-11-30 ENCOUNTER — Encounter (HOSPITAL_COMMUNITY)
Admission: RE | Admit: 2019-11-30 | Discharge: 2019-11-30 | Disposition: A | Discharge status: Home / Self Care | Payer: Medicare PPO | Source: Ambulatory Visit | Attending: Internal Medicine | Admitting: Internal Medicine

## 2019-11-30 DIAGNOSIS — I214 Non-ST elevation (NSTEMI) myocardial infarction: Secondary | ICD-10-CM

## 2019-11-30 DIAGNOSIS — Z955 Presence of coronary angioplasty implant and graft: Secondary | ICD-10-CM | POA: Diagnosis not present

## 2019-12-02 ENCOUNTER — Encounter (HOSPITAL_COMMUNITY)
Admission: RE | Admit: 2019-12-02 | Discharge: 2019-12-02 | Disposition: A | Discharge status: Home / Self Care | Payer: Medicare PPO | Source: Ambulatory Visit | Attending: Internal Medicine | Admitting: Internal Medicine

## 2019-12-02 ENCOUNTER — Other Ambulatory Visit: Payer: Self-pay

## 2019-12-02 DIAGNOSIS — Z955 Presence of coronary angioplasty implant and graft: Secondary | ICD-10-CM | POA: Diagnosis not present

## 2019-12-02 DIAGNOSIS — I214 Non-ST elevation (NSTEMI) myocardial infarction: Secondary | ICD-10-CM | POA: Diagnosis not present

## 2019-12-04 ENCOUNTER — Other Ambulatory Visit: Payer: Self-pay

## 2019-12-04 ENCOUNTER — Encounter (HOSPITAL_COMMUNITY)
Admission: RE | Admit: 2019-12-04 | Discharge: 2019-12-04 | Disposition: A | Discharge status: Home / Self Care | Payer: Medicare PPO | Source: Ambulatory Visit | Attending: Internal Medicine | Admitting: Internal Medicine

## 2019-12-04 DIAGNOSIS — Z955 Presence of coronary angioplasty implant and graft: Secondary | ICD-10-CM | POA: Diagnosis not present

## 2019-12-04 DIAGNOSIS — I214 Non-ST elevation (NSTEMI) myocardial infarction: Secondary | ICD-10-CM

## 2019-12-07 ENCOUNTER — Encounter: Payer: Medicare PPO | Admitting: *Deleted

## 2019-12-07 ENCOUNTER — Encounter (HOSPITAL_COMMUNITY)
Admission: RE | Admit: 2019-12-07 | Discharge: 2019-12-07 | Disposition: A | Discharge status: Home / Self Care | Payer: Medicare PPO | Source: Ambulatory Visit | Attending: Internal Medicine | Admitting: Internal Medicine

## 2019-12-07 ENCOUNTER — Other Ambulatory Visit: Payer: Self-pay

## 2019-12-07 DIAGNOSIS — I214 Non-ST elevation (NSTEMI) myocardial infarction: Secondary | ICD-10-CM | POA: Diagnosis not present

## 2019-12-07 DIAGNOSIS — Z955 Presence of coronary angioplasty implant and graft: Secondary | ICD-10-CM | POA: Diagnosis not present

## 2019-12-08 ENCOUNTER — Encounter: Payer: Self-pay | Admitting: Family Medicine

## 2019-12-08 ENCOUNTER — Ambulatory Visit (INDEPENDENT_AMBULATORY_CARE_PROVIDER_SITE_OTHER): Payer: Medicare PPO | Admitting: Family Medicine

## 2019-12-08 VITALS — BP 138/62 | HR 82 | Temp 98.0°F | Ht 63.0 in | Wt 149.0 lb

## 2019-12-08 DIAGNOSIS — E782 Mixed hyperlipidemia: Secondary | ICD-10-CM | POA: Diagnosis not present

## 2019-12-08 DIAGNOSIS — E039 Hypothyroidism, unspecified: Secondary | ICD-10-CM

## 2019-12-08 DIAGNOSIS — I10 Essential (primary) hypertension: Secondary | ICD-10-CM

## 2019-12-08 DIAGNOSIS — I2510 Atherosclerotic heart disease of native coronary artery without angina pectoris: Secondary | ICD-10-CM

## 2019-12-08 LAB — COMPREHENSIVE METABOLIC PANEL
ALT: 18 U/L (ref 0–35)
AST: 17 U/L (ref 0–37)
Albumin: 4.8 g/dL (ref 3.5–5.2)
Alkaline Phosphatase: 45 U/L (ref 39–117)
BUN: 25 mg/dL — ABNORMAL HIGH (ref 6–23)
CO2: 31 mEq/L (ref 19–32)
Calcium: 10.1 mg/dL (ref 8.4–10.5)
Chloride: 100 mEq/L (ref 96–112)
Creatinine, Ser: 0.89 mg/dL (ref 0.40–1.20)
GFR: 61.29 mL/min (ref >60.00)
Glucose, Bld: 91 mg/dL (ref 70–99)
Potassium: 3.4 mEq/L — ABNORMAL LOW (ref 3.5–5.1)
Sodium: 141 mEq/L (ref 135–145)
Total Bilirubin: 0.9 mg/dL (ref 0.2–1.2)
Total Protein: 6.9 g/dL (ref 6.0–8.3)

## 2019-12-08 LAB — CBC WITH DIFFERENTIAL/PLATELET
Basophils Absolute: 0.1 10*3/uL (ref 0.0–0.1)
Basophils Relative: 1.4 % (ref 0.0–3.0)
Eosinophils Absolute: 0.2 10*3/uL (ref 0.0–0.7)
Eosinophils Relative: 2.6 % (ref 0.0–5.0)
HCT: 40.7 % (ref 36.0–46.0)
Hemoglobin: 13.6 g/dL (ref 12.0–15.0)
Lymphocytes Relative: 20.8 % (ref 12.0–46.0)
Lymphs Abs: 1.6 10*3/uL (ref 0.7–4.0)
MCHC: 33.5 g/dL (ref 30.0–36.0)
MCV: 90.3 fl (ref 78.0–100.0)
Monocytes Absolute: 0.6 10*3/uL (ref 0.1–1.0)
Monocytes Relative: 7.2 % (ref 3.0–12.0)
Neutro Abs: 5.3 10*3/uL (ref 1.4–7.7)
Neutrophils Relative %: 68 % (ref 43.0–77.0)
Platelets: 317 10*3/uL (ref 150.0–400.0)
RBC: 4.5 Mil/uL (ref 3.87–5.11)
RDW: 13.1 % (ref 11.5–15.5)
WBC: 7.8 10*3/uL (ref 4.0–10.5)

## 2019-12-08 LAB — TSH: TSH: 2 u[IU]/mL (ref 0.35–4.50)

## 2019-12-08 NOTE — Progress Notes (Signed)
Phone [phone] In person visit   Subjective:   [name] is a 79 y.o. year old very pleasant female patient who presents for/with See problem oriented charting Chief Complaint  Patient presents with  . Hypertension  . Medication Management   This visit occurred during the SARS-CoV-2 public health emergency.  Safety protocols were in place, including screening questions prior to the visit, additional usage of staff PPE, and extensive cleaning of exam room while observing appropriate contact time as indicated for disinfecting solutions.   Past Medical History-  Patient Active Problem List   Diagnosis Date Noted  . CAD (coronary artery disease) s/p stent mCx 05/2019 06/02/2019    Priority: High  . Grieving 02/18/2017    Priority: High  . Osteoporosis 11/05/2016    Priority: High  . Chest pain 12/29/2018    Priority: Medium  . Primary open-angle glaucoma 11/20/2017    Priority: Medium  . Hypothyroidism 07/01/2013    Priority: Medium  . Hypertension 11/02/2012    Priority: Medium  . Hyperlipidemia 07/24/2011    Priority: Medium  . 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 History of adenomatous polyp of colon 09/30/2018    Priority: Low  . Medicare annual wellness visit, subsequent 09/26/2014    Priority: Low  . Vitamin D deficiency 11/02/2012    Priority: Low  . Obesity (BMI 30-39.9) 07/24/2011    Priority: Low  . Statin myopathy 07/01/2019  . Hypokalemia 06/02/2019  . NSTEMI (non-ST elevated myocardial infarction) ([place]) 05/31/2019  . Pseudophakia of both eyes 11/20/2017    Medications- reviewed and updated Current Outpatient Medications  Medication Sig Dispense Refill  . AMBULATORY NON FORMULARY MEDICATION Take 180 mg by mouth daily. Medication Name: 180 mg bempedoic acid vs a placebo, CLEAR Research Study drug provided    . amLODipine (NORVASC) 2.5 MG tablet Take 1 tablet (2.5 mg total) by mouth  daily. 90 tablet 3  . aspirin EC 81 MG tablet Take 81 mg by mouth daily.    . Bimatoprost (LUMIGAN OP) Place 1 drop into both eyes at bedtime.     . chlorthalidone (HYGROTON) 25 MG tablet TAKE 1 TABLET DAILY. 90 tablet 3  . Cholecalciferol (VITAMIN D-3) 5000 UNITS TABS Take 5,000 mg by mouth daily.    . dorzolamide-timolol (COSOPT) 22.3-6.8 MG/ML ophthalmic solution Place 1 drop into both eyes 2 (two) times daily.     . Evolocumab (REPATHA SURECLICK) [id number] MG/ML SOAJ Inject 1 pen into the skin every 14 (fourteen) days. 2 pen 11  . ezetimibe (ZETIA) 10 MG tablet TAKE 1 TABLET EACH DAY. 90 tablet 0  . famotidine (PEPCID) 20 MG tablet Take 1 tablet (20 mg total) by mouth 2 (two) times daily. 180 tablet 3  . Icosapent Ethyl (VASCEPA) 1 g CAPS Take 2 capsules (2 g total) by mouth 2 (two) times daily. 120 capsule 11  . levothyroxine (SYNTHROID) 50 MCG tablet Take 1 tablet (50 mcg total) by mouth daily before breakfast. 90 tablet 3  . metoprolol tartrate (LOPRESSOR) 25 MG tablet Take 1 tablet (25 mg total) by mouth 2 (two) times daily. 180 tablet 3  . nitroGLYCERIN (NITROSTAT) 0.4 MG SL tablet Place 1 tablet (0.4 mg total) under the tongue every 5 (five) minutes x 3 doses as needed for chest pain. 25 tablet 12  . pilocarpine (PILOCAR) 1 % ophthalmic solution Place 1 drop into both eyes 2 (two) times daily.     Marland Kitchen  Polyethyl Glycol-Propyl Glycol (SYSTANE OP) Place 1-2 drops into both eyes as needed (for dry eyes).     . Probiotic Product (PROBIOTIC DAILY) CAPS Take 1 capsule by mouth daily. Reported on 10/10/2015    . ticagrelor (BRILINTA) 90 MG TABS tablet Take 1 tablet (90 mg total) by mouth 2 (two) times daily. 60 tablet 12   No current facility-administered medications for this visit.     Objective:  BP 138/62   Pulse 82   Temp 98 F (36.7 C) (Temporal)   Ht 5\' 3"  (1.6 m)   Wt 149 lb (67.6 kg)   SpO2 98%   BMI 26.39 kg/m  Gen: NAD, resting comfortably CV: RRR no murmurs rubs or gallops Lungs:  CTAB no crackles, wheeze, rhonchi Abdomen: soft/nontender/nondistended/normal bowel sounds.  Ext: no edema Skin: warm, dry    Assessment and Plan   #hypertension S: compliant with amlodipine 2.5mg , chlorthalidone 25 mg, metoprolol 25 mg BID. Home blood pressures look great- all under 125/75.  BP Readings from Last 3 Encounters:  12/08/19 138/62  12/07/19 (!) 133/44  10/15/19 122/78  A/P: Stable/reasonable control. Systolic # slightly high today but home #s look great.  Continue current medications.  #hyperlipidemia/cad S: compliant with  zetia 10mg  and vascepa and repatha and clear study medicine. No rash fortunately with repatha so far- rash on praluent in the past. Has done 3 injections so far of repatha and is doing well. Feels somewhat stiff on meds.   For CAD doing well on aspirin and brilinta. No chest pain or shortness of breath. Still at cardiac rehab Lab Results  Component Value Date   CHOL 112 09/21/2019   HDL 56 09/21/2019   LDLCALC 40 09/21/2019   LDLDIRECT 114.0 04/29/2019   TRIG 77 09/21/2019   CHOLHDL 2.0 09/21/2019   A/P: CAD asymptomatic- continue lipid lowering plan aspirin and brilinta as well as cardiac rehab  For HLD- looked good last check- wait until next visit with change to repatha from praluent- continue repatha, zetia, vascepa and clear study medicine  Dr. Harrington Challenger wanted copy of CBC. Will order today  #hypothyroidism S: compliant On thyroid medication-levothyroxine 50 mcg  Lab Results  Component Value Date   TSH 1.18 09/02/2019   A/P: likely Stable. Continue current medications. Update tsh with labs  #Insomnia- able to fall asleep but having more issues waking up- goes to bed 9 30 to 12 and wakes up 3 30 or 4 sometimes. Discussed consistent bedtime, avoid screentime at minimum 30 minutes before bed.   #social update- considering [facility] but not ready for that yet  Recommended follow up: Return in about 5 months (around 05/09/2020) for physical or  sooner if needed. Future Appointments  Date Time Provider [place]  12/09/2019  1:30 PM [facility] PHASE II EXC [facility] None  12/11/2019  1:30 PM [facility] PHASE II EXC [facility] None  12/14/2019  1:30 PM [facility] PHASE II EXC [facility] None  12/16/2019  1:30 PM [facility] PHASE II EXC [facility] None  12/18/2019  1:30 PM [facility] PHASE II EXC [facility] None  12/21/2019  1:30 PM [facility] PHASE II EXC [facility] None  06/06/2020  9:00 AM [place] [facility] None    Lab/Order associations:   ICD-10-CM   1. Essential hypertension  I10 CBC with Differential/Platelet    Comprehensive metabolic panel  2. Hypothyroidism, unspecified type  E03.9 TSH  3. Mixed hyperlipidemia  E78.2 CBC with Differential/Platelet    Comprehensive metabolic panel  4. Coronary artery disease  involving native coronary artery of native heart without angina pectoris  I25.10    Return precautions advised.  Garret Reddish, MD

## 2019-12-08 NOTE — Patient Instructions (Addendum)
Please stop by lab before you go If you do not have mychart- we will call you about results within 5 business days of [place] receiving them.  If you have mychart- we will send your results within 3 business days of [place] receiving them.  If abnormal or we want to clarify a result, we will call or mychart you to make sure you receive the message.  If you have questions or concerns or don't hear within 5 business days, please send [place] a message or call us.   No changes today  Great to see you today!   Recommended follow up: Return in about 5 months (around 05/09/2020) for physical or sooner if needed.

## 2019-12-09 ENCOUNTER — Other Ambulatory Visit: Payer: Self-pay | Admitting: Family Medicine

## 2019-12-09 ENCOUNTER — Telehealth (HOSPITAL_COMMUNITY): Payer: Self-pay | Admitting: Family Medicine

## 2019-12-09 ENCOUNTER — Encounter (HOSPITAL_COMMUNITY): Payer: Medicare PPO

## 2019-12-09 MED ORDER — POTASSIUM CHLORIDE CRYS ER 20 MEQ PO TBCR: 20.0000 meq | EXTENDED_RELEASE_TABLET | Freq: Every day | ORAL | 0 refills | Status: DC | PRN

## 2019-12-11 ENCOUNTER — Encounter (HOSPITAL_COMMUNITY): Payer: Medicare PPO

## 2019-12-14 ENCOUNTER — Encounter (HOSPITAL_COMMUNITY)
Admission: RE | Admit: 2019-12-14 | Discharge: 2019-12-14 | Disposition: A | Discharge status: Home / Self Care | Payer: Medicare PPO | Source: Ambulatory Visit | Attending: Internal Medicine | Admitting: Internal Medicine

## 2019-12-14 ENCOUNTER — Other Ambulatory Visit: Payer: Self-pay

## 2019-12-14 DIAGNOSIS — I214 Non-ST elevation (NSTEMI) myocardial infarction: Secondary | ICD-10-CM | POA: Insufficient documentation

## 2019-12-14 DIAGNOSIS — Z955 Presence of coronary angioplasty implant and graft: Secondary | ICD-10-CM

## 2019-12-16 ENCOUNTER — Encounter (HOSPITAL_COMMUNITY)
Admission: RE | Admit: 2019-12-16 | Discharge: 2019-12-16 | Disposition: A | Discharge status: Home / Self Care | Payer: Medicare PPO | Source: Ambulatory Visit | Attending: Internal Medicine | Admitting: Internal Medicine

## 2019-12-16 ENCOUNTER — Other Ambulatory Visit: Payer: Self-pay

## 2019-12-16 DIAGNOSIS — I214 Non-ST elevation (NSTEMI) myocardial infarction: Secondary | ICD-10-CM

## 2019-12-16 DIAGNOSIS — Z955 Presence of coronary angioplasty implant and graft: Secondary | ICD-10-CM

## 2019-12-18 ENCOUNTER — Other Ambulatory Visit: Payer: Self-pay

## 2019-12-18 ENCOUNTER — Encounter (HOSPITAL_COMMUNITY)
Admission: RE | Admit: 2019-12-18 | Discharge: 2019-12-18 | Disposition: A | Discharge status: Home / Self Care | Payer: Medicare PPO | Source: Ambulatory Visit | Attending: Internal Medicine | Admitting: Internal Medicine

## 2019-12-18 ENCOUNTER — Other Ambulatory Visit: Payer: Self-pay | Admitting: Family Medicine

## 2019-12-18 DIAGNOSIS — Z955 Presence of coronary angioplasty implant and graft: Secondary | ICD-10-CM

## 2019-12-18 DIAGNOSIS — I214 Non-ST elevation (NSTEMI) myocardial infarction: Secondary | ICD-10-CM | POA: Diagnosis not present

## 2019-12-21 ENCOUNTER — Encounter (HOSPITAL_COMMUNITY)
Admission: RE | Admit: 2019-12-21 | Discharge: 2019-12-21 | Disposition: A | Discharge status: Home / Self Care | Payer: Medicare PPO | Source: Ambulatory Visit | Attending: Internal Medicine | Admitting: Internal Medicine

## 2019-12-21 ENCOUNTER — Other Ambulatory Visit: Payer: Self-pay

## 2019-12-21 VITALS — BP 122/82 | HR 88 | Temp 97.0°F | Ht 63.0 in | Wt 149.0 lb

## 2019-12-21 DIAGNOSIS — I214 Non-ST elevation (NSTEMI) myocardial infarction: Secondary | ICD-10-CM | POA: Diagnosis not present

## 2019-12-21 DIAGNOSIS — Z955 Presence of coronary angioplasty implant and graft: Secondary | ICD-10-CM | POA: Diagnosis not present

## 2019-12-21 NOTE — Progress Notes (Signed)
Discharge Progress Report  Patient Details  Name: [name] MRN: [id number] Date of Birth: 02/06/1941 Referring Provider:     CARDIAC REHAB PHASE II ORIENTATION from 09/17/2019 in [place]  Referring Provider  Dorris Carnes V MD        Number of Visits: 20  Reason for Discharge:  Patient reached a stable level of exercise. Patient independent in their exercise. Patient has met program and personal goals.  Smoking History:  Social History   Tobacco Use  Smoking Status Never Smoker  Smokeless Tobacco Never Used    Diagnosis:  NSTEMI (non-ST elevated myocardial infarction) ([place]) 05/31/19  Status post coronary artery stent placement 06/01/19 S/P DES CFX  ADL UCSD:   Initial Exercise Prescription: Initial Exercise Prescription - 09/17/19 1400      Date of Initial Exercise RX and Referring Provider   Date  09/17/19    Referring Provider  Fay Records MD       Bike   Minutes  30      Track   Minutes  15      Prescription Details   Frequency (times per week)  5    Duration  Progress to 30 minutes of continuous aerobic without signs/symptoms of physical distress      Intensity   THRR 40-80% of Max Heartrate  57-114    Ratings of Perceived Exertion  11-13    Perceived Dyspnea  0-4      Progression   Progression  Continue progressive overload as per policy without signs/symptoms or physical distress.      Resistance Training   Training Prescription  No       Discharge Exercise Prescription (Final Exercise Prescription Changes): Exercise Prescription Changes - 12/21/19 1339      Response to Exercise   Blood Pressure (Admit)  122/82    Blood Pressure (Exercise)  128/72    Blood Pressure (Exit)  106/72    Heart Rate (Admit)  88 bpm    Heart Rate (Exercise)  102 bpm    Heart Rate (Exit)  79 bpm    Rating of Perceived Exertion (Exercise)  11    Symptoms  None    Duration  Continue with 30 min of aerobic exercise without  signs/symptoms of physical distress.    Intensity  THRR unchanged      Progression   Progression  Continue to progress workloads to maintain intensity without signs/symptoms of physical distress.    Average METs  3.2      Resistance Training   Training Prescription  Yes    Weight  3lbs    Reps  10-15    Time  10 Minutes      Interval Training   Interval Training  No      Bike   Level  3    Minutes  15    METs  4.4      NuStep   Level  5    SPM  85    Minutes  15    METs  1.9      Home Exercise Plan   Plans to continue exercise at  Home (comment)    Frequency  Add 3 additional days to program exercise sessions.    Initial Home Exercises Provided  09/17/19       Functional Capacity: 6 Minute Walk    Row Name 09/17/19 1442 12/18/19 1329       6 Minute Walk  Phase  Initial  Discharge    Distance  1095 feet  1230 feet    Distance % Change  --  12.33 %    Distance Feet Change  --  135 ft    Walk Time  0 minutes  6 minutes    # of Rest Breaks  0  0    MPH  2.07  2.33    METS  1.98  2.34    RPE  13  11    Perceived Dyspnea   0  0    VO2 Peak  6.9  8.18    Symptoms  No  No    Resting HR  59 bpm  79 bpm    Resting BP  108/82  122/64    Resting Oxygen Saturation   97 %  --    Exercise Oxygen Saturation  during 6 min walk  97 %  --    Max Ex. HR  86 bpm  109 bpm    Max Ex. BP  138/84  122/72    2 Minute Post BP  110/70  98/62       Psychological, QOL, Others - Outcomes: PHQ 2/9: Depression screen [facility] 2/9 10/15/2019 09/17/2019 09/02/2019 07/27/2019 04/29/2019  Decreased Interest 0 0 0 0 0  Down, Depressed, Hopeless 0 0 0 0 0  PHQ - 2 Score 0 0 0 0 0  Altered sleeping 1 - 0 2 -  Tired, decreased energy 0 - 1 0 -  Change in appetite 0 - 0 0 -  Feeling bad or failure about yourself  0 - 0 0 -  Trouble concentrating 0 - 0 0 -  Moving slowly or fidgety/restless 0 - 0 0 -  Suicidal thoughts 0 - 0 0 -  PHQ-9 Score 1 - 1 2 -  Difficult doing work/chores Not  difficult at all - Not difficult at all Not difficult at all -  Some recent data might be hidden    Quality of Life: Quality of Life - 12/29/19 0832      Quality of Life   Select  Quality of Life      Quality of Life Scores   Health/Function Pre  24.21 %    Health/Function Post  24.86 %    Health/Function % Change  2.68 %    Socioeconomic Pre  27.92 %    Socioeconomic Post  29.58 %    Socioeconomic % Change   5.95 %    Psych/Spiritual Pre  27.07 %    Psych/Spiritual Post  25.71 %    Psych/Spiritual % Change  -5.02 %    Family Pre  29.38 %    Family Post  25.13 %    Family % Change  -14.47 %    GLOBAL Pre  26.24 %    GLOBAL Post  26 %    GLOBAL % Change  -0.91 %       Personal Goals: Goals established at orientation with interventions provided to work toward goal. Personal Goals and Risk Factors at Admission - 09/17/19 1526      Core Components/Risk Factors/Patient Goals on Admission    Weight Management  Yes;Weight Maintenance    Intervention  Weight Management: Develop a combined nutrition and exercise program designed to reach desired caloric intake, while maintaining appropriate intake of nutrient and fiber, sodium and fats, and appropriate energy expenditure required for the weight goal.;Weight Management: Provide education and appropriate resources to help participant work on and  attain dietary goals.;Weight Management/Obesity: Establish reasonable short term and long term weight goals.    Expected Outcomes  Short Term: Continue to assess and modify interventions until short term weight is achieved;Long Term: Adherence to nutrition and physical activity/exercise program aimed toward attainment of established weight goal;Weight Maintenance: Understanding of the daily nutrition guidelines, which includes 25-35% calories from fat, 7% or less cal from saturated fats, less than '200mg'$  cholesterol, less than 1.5gm of sodium, & 5 or more servings of fruits and vegetables  daily;Understanding recommendations for meals to include 15-35% energy as protein, 25-35% energy from fat, 35-60% energy from carbohydrates, less than '200mg'$  of dietary cholesterol, 20-35 gm of total fiber daily;Understanding of distribution of calorie intake throughout the day with the consumption of 4-5 meals/snacks    Hypertension  Yes    Intervention  Provide education on lifestyle modifcations including regular physical activity/exercise, weight management, moderate sodium restriction and increased consumption of fresh fruit, vegetables, and low fat dairy, alcohol moderation, and smoking cessation.;Monitor prescription use compliance.    Expected Outcomes  Short Term: Continued assessment and intervention until BP is < 140/69m HG in hypertensive participants. < 130/850mHG in hypertensive participants with diabetes, heart failure or chronic kidney disease.;Long Term: Maintenance of blood pressure at goal levels.    Lipids  Yes    Intervention  Provide education and support for participant on nutrition & aerobic/resistive exercise along with prescribed medications to achieve LDL '70mg'$ , HDL >'40mg'$ .    Expected Outcomes  Short Term: Participant states understanding of desired cholesterol values and is compliant with medications prescribed. Participant is following exercise prescription and nutrition guidelines.;Long Term: Cholesterol controlled with medications as prescribed, with individualized exercise RX and with personalized nutrition plan. Value goals: LDL < '70mg'$ , HDL > 40 mg.        Personal Goals Discharge: Goals and Risk Factor Review    Row Name 11/18/19 1154 12/14/19 1703 12/21/19 1211         Core Components/Risk Factors/Patient Goals Review   Personal Goals Review  Weight Management/Obesity;Lipids;Hypertension  Weight Management/Obesity;Lipids;Hypertension  --     Review  Patient with multiple CAD risk factors.  BrProvidenciaontinues to tolerate exercise well.  She feels that she is  increasing her strength.  Patient with multiple CAD risk factors.  BrMadysons tolerating exercise well.  She was recently switched to upright bike instead of the recumbent bike.  Her METs have improved with this change.  She is enjoying the exercise and socialization of CR.  --     Expected Outcomes  Patient will continue to participate in cardiac rehab for risk factor modification to prevent CV disease.  Patient will continue to participate in cardiac rehab for risk factor modification to prevent CV disease.  BrJunoill continue to exercise at home and participate in lifestyle modifications upon discharge to prevent additional CV disease complications.        Exercise Goals and Review:   Exercise Goals Re-Evaluation: Exercise Goals Re-Evaluation    Row Name 10/19/19 1142 11/02/19 0826 11/18/19 1330 12/02/19 1429 12/14/19 1345     Exercise Goal Re-Evaluation   Exercise Goals Review  --  Increase Physical Activity;Increase Strength and Stamina;Able to understand and use rate of perceived exertion (RPE) scale;Knowledge and understanding of Target Heart Rate Range (THRR);Able to check pulse independently;Understanding of Exercise Prescription  Increase Physical Activity;Increase Strength and Stamina;Able to understand and use rate of perceived exertion (RPE) scale;Knowledge and understanding of Target Heart Rate Range (THRR);Able to check pulse  independently;Understanding of Exercise Prescription  Increase Physical Activity;Increase Strength and Stamina;Able to understand and use rate of perceived exertion (RPE) scale;Knowledge and understanding of Target Heart Rate Range (THRR);Able to check pulse independently;Understanding of Exercise Prescription  Increase Physical Activity;Increase Strength and Stamina;Able to understand and use rate of perceived exertion (RPE) scale;Knowledge and understanding of Target Heart Rate Range (THRR);Able to check pulse independently;Understanding of Exercise Prescription    Comments  Patient has been actively participating in the virtual cardiac rehab program via the [employer] app and has been progressing well. Patient is riding her stationary bike 30 minutes 4-5 days/week.  Pt first day of exercise in CR program. Pt tolerated exercise well. Pt understands THRR, RPE scale, and exercise Rx. Pt is continuing to use the Virtual CR program as well.  Reviewed METs and goals with Pt. Pt MET average is 2.4. Pt is progressing well in the program and continues to exercise at home by using her [employer] bike for 20 minutes 2-4 days per week. Pt has not been logging this in the Virtual CR program but stated she would start doing so.  Patient states that exercising in the cardiac rehab program has really helped her. Pt is riding her stationary bike at home 2 miles, 15-20 minutes. Patient asked if she should increase her duration, and we agreed that she will increase from 15-20 minutes once/day to twice/day.  Patient has been doing really well since switching from recumbent bike to upright bike. Patient is riding her stationary bike at home 15 minutes daily and plans to increase frequency to 15 minutes BID. Patient has either 2 or 3 lb weights at home to use for her resistance training. Patient had an in-home personal trainer prior to COVID-19 and plans to start using PT again.   Expected Outcomes  Patient will transition to the onsite cardiac rehab program if interested.  Will continue to monitor and progress Pt as tolerated.  Will continue to monitor and progress Pt as tolerated.  Patient will increase exercise duration at home from 15-20 minutes once/day to 15-20 minutes twice/day.  Patient will continue daily exercise routine: riding her stationary bike upon completion of the cardiac rehab program.   [place] Name 12/21/19 1432             Exercise Goal Re-Evaluation   Exercise Goals Review  Increase Physical Activity;Increase Strength and Stamina;Able to understand and use rate of  perceived exertion (RPE) scale;Knowledge and understanding of Target Heart Rate Range (THRR);Able to check pulse independently;Understanding of Exercise Prescription       Comments  Patient completed the cardiac rehab program and has done well. Functional capacity increased 12% as measured by 6MWT, and patient was abel to achieve 3.2 METs with exercise. Patient will continue her exercise at home riding her stationary bike and plans to resume exercise with in-home personal trainer 2 days/week.       Expected Outcomes  Patient will continue daily exercise routine to help maintain health and fitness gains.          Nutrition & Weight - Outcomes: Pre Biometrics - 09/17/19 1445      Pre Biometrics   Height  '5\' 3"'$  (1.6 m)    Weight  68.5 kg    Waist Circumference  40 inches    Hip Circumference  39 inches    Waist to Hip Ratio  1.03 %    BMI (Calculated)  26.76    Triceps Skinfold  16 mm    %  Body Fat  38.3 %    Grip Strength  27 kg    Flexibility  13 in    Single Leg Stand  9.81 seconds      Post Biometrics - 12/21/19 1339       Post  Biometrics   Height  _0  (1.6 m)    Weight  67.6 kg    Waist Circumference  36.5 inches    Hip Circumference  41 inches    Waist to Hip Ratio  0.89 %    BMI (Calculated)  26.41    Triceps Skinfold  26 mm    % Body Fat  39.4 %    Grip Strength  27.25 kg    Flexibility  11.5 in    Single Leg Stand  8.31 seconds       Nutrition: Nutrition Therapy & Goals - 11/13/19 0918      Nutrition Therapy   Diet  Heart Healthy      Personal Nutrition Goals   Nutrition Goal  Pt to maintain her current heart healthy diet    Personal Goal #2  Pt to maintain 30 lbs weight loss      Intervention Plan   Intervention  Prescribe, educate and counsel regarding individualized specific dietary modifications aiming towards targeted core components such as weight, hypertension, lipid management, diabetes, heart failure and other comorbidities.    Expected Outcomes   Long Term Goal: Adherence to prescribed nutrition plan.       Nutrition Discharge: Nutrition Assessments - 12/21/19 1322      MEDFICTS Scores   Post Score  10       Education Questionnaire Score: Knowledge Questionnaire Score - 12/21/19 1339      Knowledge Questionnaire Score   Pre Score  21/24    Post Score  21/24       Goals reviewed with patient; copy given to patient.

## 2020-02-12 ENCOUNTER — Ambulatory Visit (INDEPENDENT_AMBULATORY_CARE_PROVIDER_SITE_OTHER): Payer: Medicare PPO | Admitting: Family Medicine

## 2020-02-12 ENCOUNTER — Encounter: Payer: Self-pay | Admitting: Family Medicine

## 2020-02-12 ENCOUNTER — Ambulatory Visit: Payer: Medicare PPO | Admitting: Family Medicine

## 2020-02-12 ENCOUNTER — Other Ambulatory Visit: Payer: Self-pay

## 2020-02-12 VITALS — BP 130/72 | HR 80 | Temp 98.4°F | Ht 63.0 in | Wt 149.0 lb

## 2020-02-12 DIAGNOSIS — I10 Essential (primary) hypertension: Secondary | ICD-10-CM | POA: Diagnosis not present

## 2020-02-12 DIAGNOSIS — E782 Mixed hyperlipidemia: Secondary | ICD-10-CM

## 2020-02-12 DIAGNOSIS — M79672 Pain in left foot: Secondary | ICD-10-CM | POA: Diagnosis not present

## 2020-02-12 NOTE — Progress Notes (Signed)
Phone [phone] In person visit   Subjective:   [name] is a 79 y.o. year old very pleasant female patient who presents for/with See problem oriented charting Chief Complaint  Patient presents with  . Foot Pain   This visit occurred during the SARS-CoV-2 public health emergency.  Safety protocols were in place, including screening questions prior to the visit, additional usage of staff PPE, and extensive cleaning of exam room while observing appropriate contact time as indicated for disinfecting solutions.   Past Medical History-  Patient Active Problem List   Diagnosis Date Noted  . CAD (coronary artery disease) s/p stent mCx 05/2019 06/02/2019    Priority: High  . Grieving 02/18/2017    Priority: High  . Osteoporosis 11/05/2016    Priority: High  . Chest pain 12/29/2018    Priority: Medium  . Primary open-angle glaucoma 11/20/2017    Priority: Medium  . Hypothyroidism 07/01/2013    Priority: Medium  . Hypertension 11/02/2012    Priority: Medium  . Hyperlipidemia 07/24/2011    Priority: Medium  . 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 History of adenomatous polyp of colon 09/30/2018    Priority: Low  . Medicare annual wellness visit, subsequent 09/26/2014    Priority: Low  . Vitamin D deficiency 11/02/2012    Priority: Low  . Obesity (BMI 30-39.9) 07/24/2011    Priority: Low  . Statin myopathy 07/01/2019  . Hypokalemia 06/02/2019  . NSTEMI (non-ST elevated myocardial infarction) ([place]) 05/31/2019  . Pseudophakia of both eyes 11/20/2017    Medications- reviewed and updated Current Outpatient Medications  Medication Sig Dispense Refill  . AMBULATORY NON FORMULARY MEDICATION Take 180 mg by mouth daily. Medication Name: 180 mg bempedoic acid vs a placebo, CLEAR Research Study drug provided    . amLODipine (NORVASC) 2.5 MG tablet Take 1 tablet (2.5 mg total) by mouth daily. 90 tablet 3  . aspirin  EC 81 MG tablet Take 81 mg by mouth daily.    . Bimatoprost (LUMIGAN OP) Place 1 drop into both eyes at bedtime.     . chlorthalidone (HYGROTON) 25 MG tablet TAKE 1 TABLET DAILY. 90 tablet 3  . Cholecalciferol (VITAMIN D-3) 5000 UNITS TABS Take 5,000 mg by mouth daily.    . dorzolamide-timolol (COSOPT) 22.3-6.8 MG/ML ophthalmic solution Place 1 drop into both eyes 2 (two) times daily.     . Evolocumab (REPATHA SURECLICK) 829 MG/ML SOAJ Inject 1 pen into the skin every 14 (fourteen) days. 2 pen 11  . ezetimibe (ZETIA) 10 MG tablet TAKE 1 TABLET EACH DAY. 90 tablet 0  . famotidine (PEPCID) 20 MG tablet Take 1 tablet (20 mg total) by mouth 2 (two) times daily. 180 tablet 3  . Icosapent Ethyl (VASCEPA) 1 g CAPS Take 2 capsules (2 g total) by mouth 2 (two) times daily. 120 capsule 11  . levothyroxine (SYNTHROID) 50 MCG tablet Take 1 tablet (50 mcg total) by mouth daily before breakfast. 90 tablet 3  . nitroGLYCERIN (NITROSTAT) 0.4 MG SL tablet Place 1 tablet (0.4 mg total) under the tongue every 5 (five) minutes x 3 doses as needed for chest pain. 25 tablet 12  . pilocarpine (PILOCAR) 1 % ophthalmic solution Place 1 drop into both eyes 2 (two) times daily.     Vladimir Faster Glycol-Propyl Glycol (SYSTANE OP) Place 1-2 drops into both eyes as needed (for dry eyes).     . potassium  chloride SA (KLOR-CON) 20 MEQ tablet Take 1 tablet (20 mEq total) by mouth daily as needed (if instructed to use for low potassium). 30 tablet 0  . Probiotic Product (PROBIOTIC DAILY) CAPS Take 1 capsule by mouth daily. Reported on 10/10/2015    . ticagrelor (BRILINTA) 90 MG TABS tablet Take 1 tablet (90 mg total) by mouth 2 (two) times daily. 60 tablet 12  . metoprolol tartrate (LOPRESSOR) 25 MG tablet Take 1 tablet (25 mg total) by mouth 2 (two) times daily. 180 tablet 3   No current facility-administered medications for this visit.     Objective:  BP 130/72   Pulse 80   Temp 98.4 F (36.9 C) (Temporal)   Ht 5\' 3"  (1.6 m)    Wt 149 lb (67.6 kg)   SpO2 97%   BMI 26.39 kg/m  Gen: NAD, resting comfortably CV: RRR no murmurs rubs or gallops Lungs: CTAB no crackles, wheeze, rhonchi Ext: no edema Skin: warm, dry MSK: No pain over digits of left foot.  No pain over distal portion of metatarsals.  Toward central portion of midfoot patient does have some pain with palpation-rates as 8/10 but does not appear in significant distress with palpation    Assessment and Plan   # Left Foot pain  S: Hit left foot about 3 weeks ago- hit her left toenail on metal base plate but then throbbing pain into the foot- rather intense pain at the moment but then symptoms improved.   Then starting a week ago-has had some tenderness in the foot and swelling at top of foot. Tylenol arthritis helps it. stopain topical analgesic helps as well.  Better in AM usually- worse as day goes on. Worse pain 8/10 palpation but 4/10 most of the time or less if uses Tylenol or stop pain medication A/P: Left midfoot pain-possible sprain.  Doubt fracture or dislocation.  Would refer to sports medicine if not improving in the next 7 to 10 days.  Needs to avoid oral NSAIDs due to Brilinta From AVS "  Patient Instructions  I want you to ice your foot 3x a day for 20 minutes. After 3 days I want you to try heat 3x a day for 20 minutes.   I also want you to buy Voltaren gel-use this 3-4x a day for a week  If not getting better after a week for symptoms worsen please let me know and I will order an x-ray of your foot-this would likely be at our [place] location  Update me in 7-10 days regardless  Recommended follow up: Return for as needed for new, worsening, persistent symptoms.   "   #hypertension S: medication: amlodipine 2.5mg , chlorthalidone 25 mg, metoprolol 25 mg BID Home BPs: reports good values BP Readings from Last 3 Encounters:  02/12/20 130/72  12/21/19 122/82  12/08/19 138/62  A/P: Stable. Continue current medications.    #hyperlipidemia S: Medication: repatha (gets some local swelling but doesn't last as long or isnt as bad as praluent, icosapent ethyl, zetia . Wonders if foot issue is related to repatha Lab Results  Component Value Date   CHOL 112 09/21/2019   HDL 56 09/21/2019   LDLCALC 40 09/21/2019   LDLDIRECT 114.0 04/29/2019   TRIG 77 09/21/2019   CHOLHDL 2.0 09/21/2019   A/P: hopefully controlled- will plan on checking lipids at [place] in september -Continue current medication.  I told her I did not think her foot symptoms were related to Repatha  Recommended follow up: Return  for as needed for new, worsening, persistent symptoms.  She has follow-up visit in September Future Appointments  Date Time Provider [place]  04/25/2020  4:20 PM Fay Records, MD [facility]  05/12/2020 10:40 AM Marin Olp, MD [facility]  06/06/2020  9:00 AM [place] 2 [facility] None   Lab/Order associations:   ICD-10-CM   1. Left foot pain  M79.672   2. Essential hypertension  I10   3. Mixed hyperlipidemia  E78.2    Return precautions advised.  Garret Reddish, MD

## 2020-02-12 NOTE — Patient Instructions (Addendum)
I want you to ice your foot 3x a day for 20 minutes. After 3 days I want you to try heat 3x a day for 20 minutes.   I also want you to buy Voltaren gel-use this 3-4x a day for a week  If not getting better after a week for symptoms worsen please let me know and I will order an x-ray of your foot-this would likely be at our [place] location  Update me in 7-10 days regardless  Recommended follow up: Return for as needed for new, worsening, persistent symptoms.

## 2020-02-22 ENCOUNTER — Telehealth: Payer: Self-pay | Admitting: Family Medicine

## 2020-02-22 NOTE — Telephone Encounter (Signed)
Yes fine to order x-ray.  You can also tell patient if we do a sports medicine referral that they likely would be able to do x-ray on the same day if she prefers that.  I know our sports medicine physicians will take excellent care of her

## 2020-02-22 NOTE — Telephone Encounter (Signed)
Per AVS:  If not getting better after a week for symptoms worsen please let me know and I will order an x-ray of your foot-this would likely be at our [place] location  [facility] to place order for xray?

## 2020-02-22 NOTE — Telephone Encounter (Signed)
Patient called in and stated she did what Dr. Yong Channel told her to do for her foot. Patient said she was more active this weekend but she said it was still stiff. Patient wanted to know if she should get the xray before she gets the referral to sports medicine.

## 2020-02-23 ENCOUNTER — Other Ambulatory Visit: Payer: Self-pay

## 2020-02-23 DIAGNOSIS — M79672 Pain in left foot: Secondary | ICD-10-CM

## 2020-02-23 NOTE — Telephone Encounter (Signed)
Called and spoke with pt and placed referral to sports medicine. Pt voiced understanding.

## 2020-02-24 ENCOUNTER — Other Ambulatory Visit: Payer: Self-pay | Admitting: Family Medicine

## 2020-02-26 ENCOUNTER — Other Ambulatory Visit: Payer: Self-pay

## 2020-02-26 ENCOUNTER — Encounter: Payer: Self-pay | Admitting: Family Medicine

## 2020-02-26 ENCOUNTER — Ambulatory Visit: Payer: Self-pay

## 2020-02-26 ENCOUNTER — Ambulatory Visit: Payer: Medicare PPO | Admitting: Family Medicine

## 2020-02-26 ENCOUNTER — Ambulatory Visit (INDEPENDENT_AMBULATORY_CARE_PROVIDER_SITE_OTHER): Payer: Medicare PPO

## 2020-02-26 VITALS — BP 150/80 | HR 65 | Ht 63.0 in | Wt 151.0 lb

## 2020-02-26 DIAGNOSIS — M79672 Pain in left foot: Secondary | ICD-10-CM | POA: Diagnosis not present

## 2020-02-26 DIAGNOSIS — M19072 Primary osteoarthritis, left ankle and foot: Secondary | ICD-10-CM | POA: Diagnosis not present

## 2020-02-26 NOTE — Progress Notes (Signed)
    Subjective:    CC: L foot pain  I, Kana Thompson, LAT, ATC, am serving as scribe for Dr. Lynne Leader.  HPI: Pt is a 79 y/o female presenting w/ c/o L dorsal foot pain and swelling.  She rates her pain at a 4/10 on average and an 8/10 at it's worst.  She saw her PCP who recommended ice and Voltaren gel. Patient states she stubbed her toe. She locates her pain to the midfoot. Swelling has gone down. Patient states her foot is stiff and ROM of limited. Patient had a heart attack last September.   Radiating pain: Swelling: yes on her dorsal foot Aggravating factors: walking  Treatments tried: Tylenol, ice, voltaren gel, heat  Pertinent review of Systems: No fevers or chills  Relevant historical information: History NSTEMI   Objective:    Vitals:   02/26/20 1016  BP: (!) 150/80  Pulse: 65  SpO2: 94%   General: Well Developed, well nourished, and in no acute distress.   MSK: Left foot slightly swollen over dorsal midfoot.  Small bunion formation present also. Tender palpation dorsal midfoot. Normal foot and ankle motion.  Pulses cap refill and sensation are intact distally.  Lab and Radiology Results  X-ray images left foot obtained today personally and independently reviewed Significant midfoot DJD.  No acute fractures visible Await formal radiology review  Diagnostic Limited MSK Ultrasound of: Left dorsal midfoot Degenerative changes present throughout dorsal midfoot with no obvious visible fracture. Impression: DJD    Impression and Recommendations:    Assessment and Plan: 79 y.o. female with left midfoot pain after stubbing her toe.  No fractures per my interpretation however radiology overread is still pending.  Plan for postop shoe and relative rest along with Voltaren gel.  Check back in a few weeks if not improving.Marland Kitchen  PDMP not reviewed this encounter. Orders Placed This Encounter  Procedures  . [place] LIMITED JOINT SPACE STRUCTURES LOW LEFT(NO LINKED  CHARGES)    Order Specific Question:   Reason for Exam (SYMPTOM  OR DIAGNOSIS REQUIRED)    Answer:   left foot pain    Order Specific Question:   Preferred imaging location?    Answer:   Internal  . DG Foot Complete Left    Standing Status:   Future    Number of Occurrences:   1    Standing Expiration Date:   02/25/2021    Order Specific Question:   Reason for Exam (SYMPTOM  OR DIAGNOSIS REQUIRED)    Answer:   foot pain doral midfoot    Order Specific Question:   Preferred imaging location?    Answer:   [facility]    Order Specific Question:   Radiology Contrast Protocol - do NOT remove file path    Answer:   \\[email or web address]   No orders of the defined types were placed in this encounter.   Discussed warning signs or symptoms. Please see discharge instructions. Patient expresses understanding.   The above documentation has been reviewed and is accurate and complete Lynne Leader, M.D.

## 2020-02-26 NOTE — Progress Notes (Signed)
X-ray foot shows no acute fractures.  The x-ray does show arthritis.

## 2020-02-26 NOTE — Patient Instructions (Addendum)
Thank you for coming in today. Ok to use a cam walker boot that you have.  However a post op shoe (that you can get at [facility] supply) will be less annoying and lighter.  Also ok to use over the counter voltaren gel up to 4x daily on your foot.  We will get an xray today.  My office will contact you with radiology results likely Monday.   Ok to use the shoe as needed for pain.  Recheck in 2-4 weeks if not improving.

## 2020-02-29 DIAGNOSIS — H401422 Capsular glaucoma with pseudoexfoliation of lens, left eye, moderate stage: Secondary | ICD-10-CM | POA: Diagnosis not present

## 2020-02-29 DIAGNOSIS — H401413 Capsular glaucoma with pseudoexfoliation of lens, right eye, severe stage: Secondary | ICD-10-CM | POA: Diagnosis not present

## 2020-03-08 ENCOUNTER — Telehealth: Payer: Self-pay | Admitting: *Deleted

## 2020-03-08 NOTE — Telephone Encounter (Signed)
Left message with patient in regards to the CLEAR research study. Did do a medical record check for visit T15,M39. All AE's/SAE's have been reported to sponsor. Current medications have been updated. Plan to see [name] in the office for visit on September 27th, 2021.

## 2020-03-12 IMAGING — CT CT ANGIO CHEST-ABD-PELV FOR DISSECTION W/ AND WO/W CM
2 of 7 series · 15 of 46 positions shown, 17 images · IV contrast (OMNI 350)
Comparison: None.

CLINICAL DATA: Chest pain

EXAM:
CT ANGIOGRAPHY CHEST, ABDOMEN AND PELVIS
TECHNIQUE: Multidetector CT imaging through the chest, abdomen and pelvis was
performed using the standard protocol during bolus administration of
intravenous contrast. Multiplanar reconstructed images and MIPs were
obtained and reviewed to evaluate the vascular anatomy.
CONTRAST:  89mL OMNIPAQUE IOHEXOL 350 MG/ML SOLN

[Series 7: dissection 2mm · axial · 0.93mm/px · z∈[+774,+1304]mm · 12 of 297 slices shown, 14 images]
[im 16/297  soft-tissue]
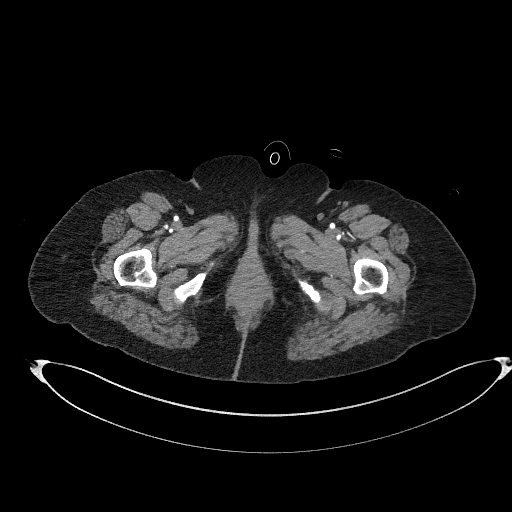
[im 16/297  bone]
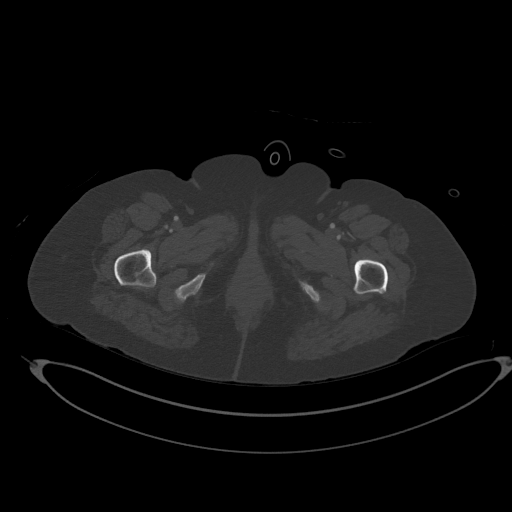
[im 47/297  soft-tissue]
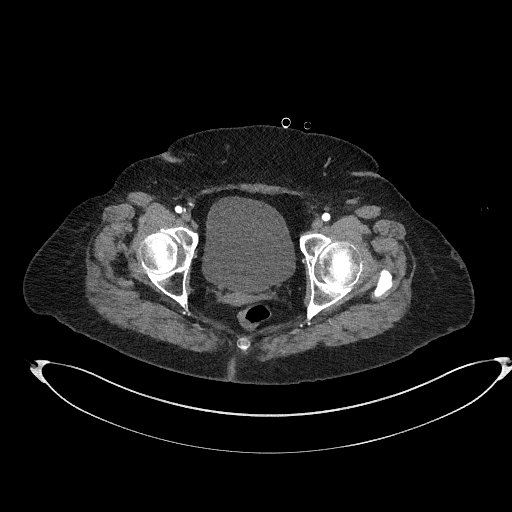
[im 63/297  soft-tissue]
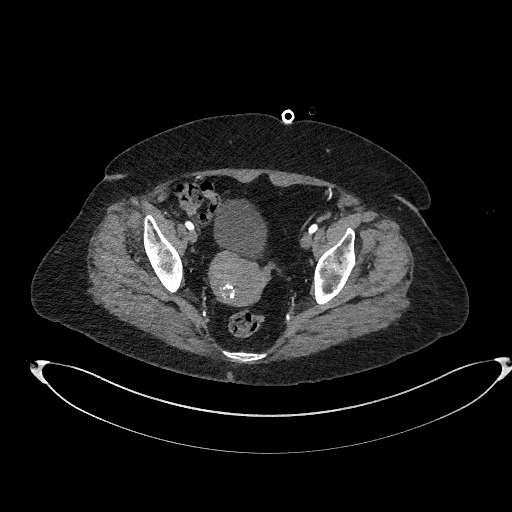
[im 94/297  soft-tissue]
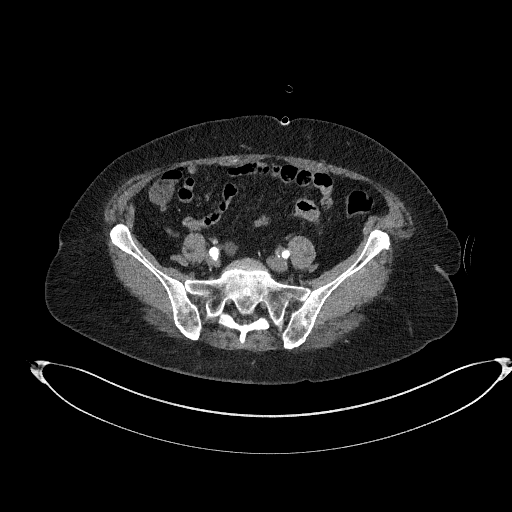
[im 110/297  soft-tissue]
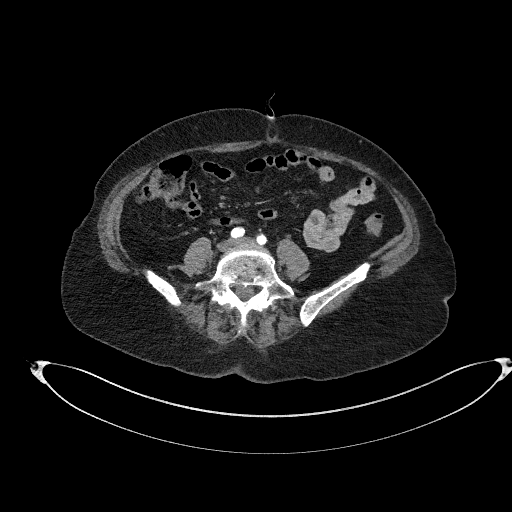
[im 141/297  soft-tissue]
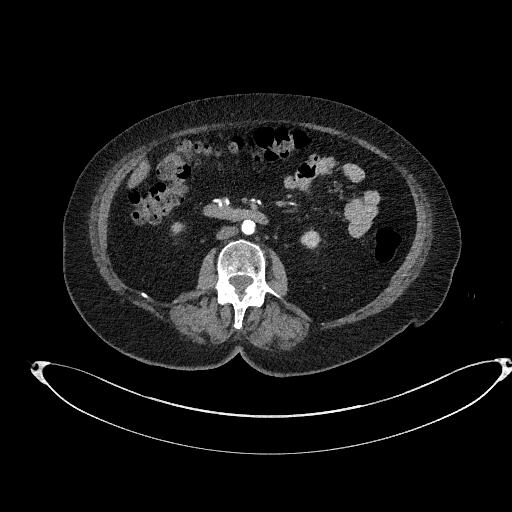
[im 156/297  soft-tissue]
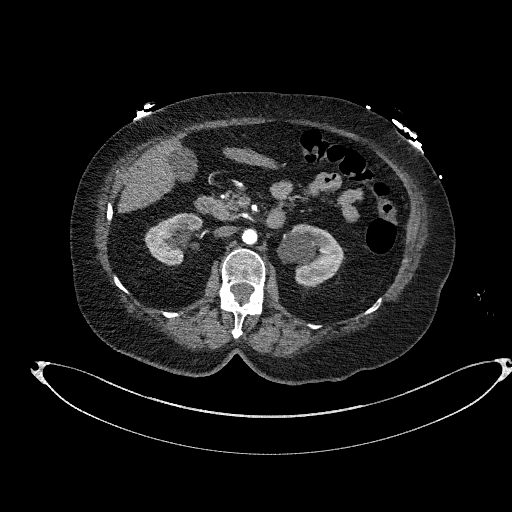
[im 187/297  soft-tissue]
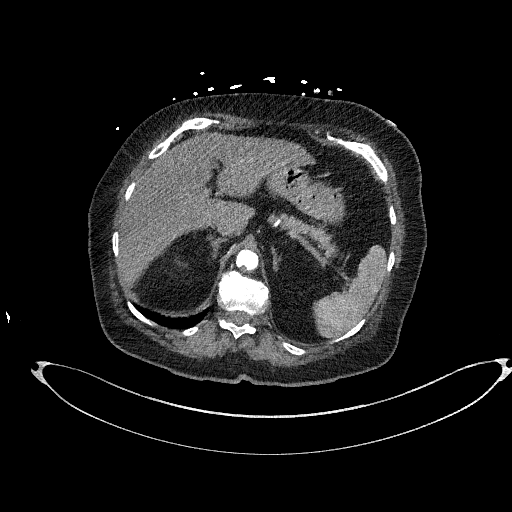
[im 203/297  soft-tissue]
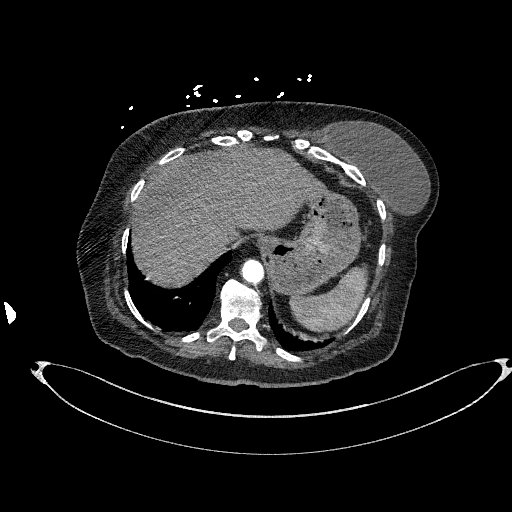
[im 203/297  bone]
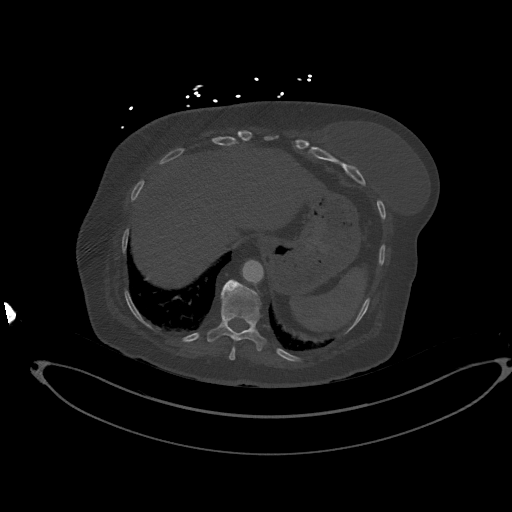
[im 234/297  soft-tissue]
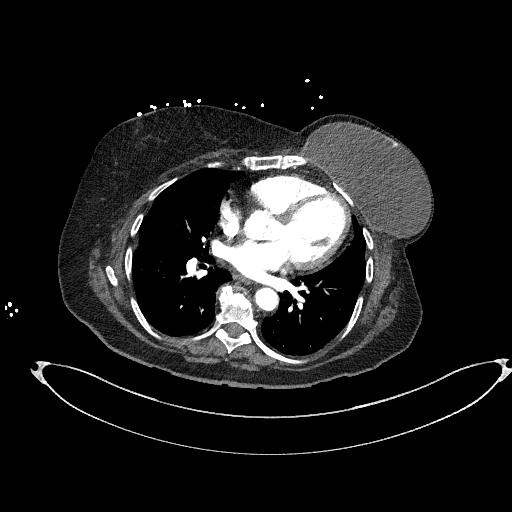
[im 250/297  soft-tissue]
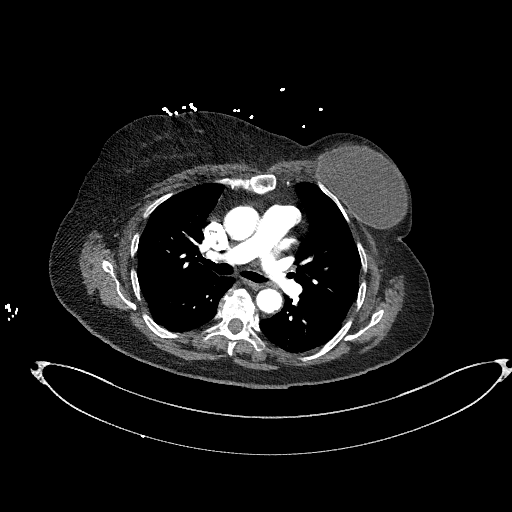
[im 281/297  soft-tissue]
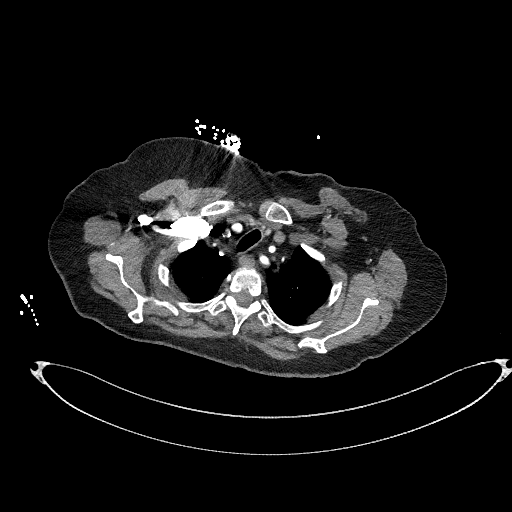

[Series 10: dissection 2mm cor · coronal · 0.86mm/px · 3 of 149 slices shown]
[im 38/149  soft-tissue]
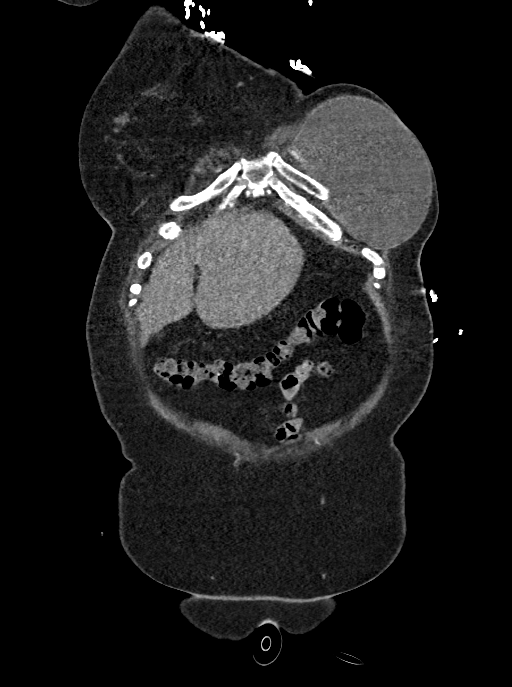
[im 75/149  soft-tissue]
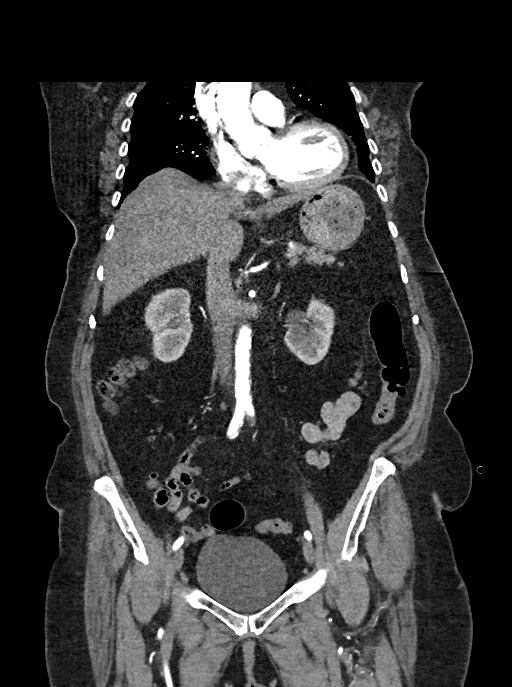
[im 112/149  soft-tissue]
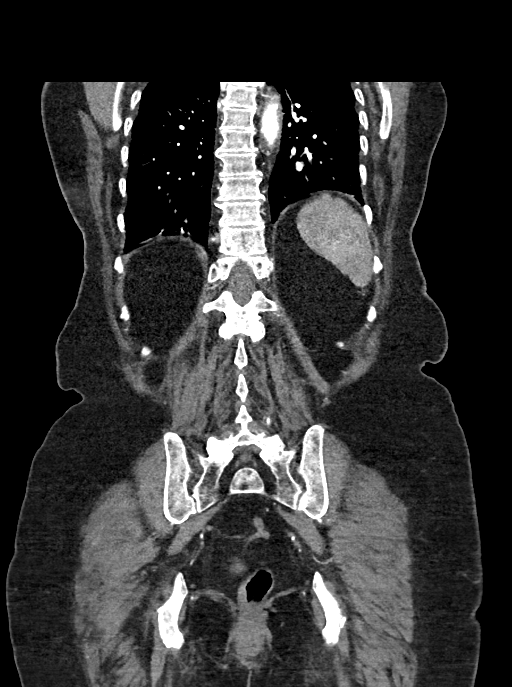

[15 of 46 positions shown; findings below may reference images not displayed]

FINDINGS: CTA CHEST FINDINGS

Cardiovascular: Moderate aortic atherosclerosis. No aneurysm or
dissection. Heart is borderline in size. No filling defects in the
pulmonary arteries to suggest pulmonary emboli.

Mediastinum/Nodes: No mediastinal, hilar, or axillary adenopathy.

Lungs/Pleura: No confluent opacities or effusions.

Musculoskeletal: Prior left breast implant. Chest wall soft tissues
otherwise unremarkable. No acute bony abnormality.

Review of the MIP images confirms the above findings.

CTA ABDOMEN AND PELVIS FINDINGS

VASCULAR

Aorta: Atherosclerotic irregularity with both calcified and
noncalcified plaque. No aneurysm or dissection.

Celiac: Occluded. Collateral flow noted in the splenic and hepatic
arteries via the SMA.

SMA: Patent without evidence of aneurysm, dissection, vasculitis or
significant stenosis.

Renals: Single bilaterally, patent.

IMA: Patent.

Inflow: Calcified and noncalcified plaque in the common and external
iliac arteries bilaterally. No significant stenosis, aneurysm or
dissection.

Veins: No obvious venous abnormality within the limitations of this
arterial phase study.

Review of the MIP images confirms the above findings.

NON-VASCULAR

Hepatobiliary: No focal hepatic abnormality. Small layering
gallstones within the gallbladder.

Pancreas: No focal abnormality or ductal dilatation.

Spleen: No focal abnormality.  Normal size.

Adrenals/Urinary Tract: Suspect parapelvic cysts on the right and
possibly the left. However, cannot exclude hydronephrosis on the
left. The ureters are decompressed. If this is hydronephrosis on the
left, I would favor chronic UPJ obstruction. Urinary bladder and
adrenal glands unremarkable.

Stomach/Bowel: Normal appendix. Stomach, large and small bowel
grossly unremarkable.

Lymphatic: No adenopathy

Reproductive: Calcified fibroids in the uterus.  No adnexal mass.

Other: No free fluid or free air.

Musculoskeletal: No acute bony abnormality.

Review of the MIP images confirms the above findings.
IMPRESSION: Calcified and noncalcified plaque throughout the thoracic and
abdominal aorta. No aneurysm or dissection.

Occlusion of the celiac artery with reconstitution of the vessels
via SMA collaterals.

No evidence of pulmonary embolus.

Probable parapelvic cysts on the right. Parapelvic cysts versus
hydronephrosis on the left. Ureter is decompressed, so if this is
hydronephrosis, I would favor chronic UPJ obstruction.

## 2020-03-16 ENCOUNTER — Encounter (HOSPITAL_COMMUNITY): Payer: Self-pay

## 2020-03-16 ENCOUNTER — Telehealth: Payer: Self-pay | Admitting: *Deleted

## 2020-03-16 ENCOUNTER — Emergency Department (HOSPITAL_COMMUNITY)
Admission: EM | Admit: 2020-03-16 | Discharge: 2020-03-16 | Disposition: A | Discharge status: Home / Self Care | Payer: Medicare PPO | Attending: Emergency Medicine | Admitting: Emergency Medicine

## 2020-03-16 ENCOUNTER — Telehealth: Payer: Self-pay | Admitting: Specialist

## 2020-03-16 ENCOUNTER — Emergency Department (HOSPITAL_COMMUNITY): Payer: Medicare PPO

## 2020-03-16 ENCOUNTER — Ambulatory Visit: Payer: Medicare PPO | Admitting: Physician Assistant

## 2020-03-16 DIAGNOSIS — Z853 Personal history of malignant neoplasm of breast: Secondary | ICD-10-CM | POA: Insufficient documentation

## 2020-03-16 DIAGNOSIS — Z79899 Other long term (current) drug therapy: Secondary | ICD-10-CM | POA: Insufficient documentation

## 2020-03-16 DIAGNOSIS — Z7982 Long term (current) use of aspirin: Secondary | ICD-10-CM | POA: Insufficient documentation

## 2020-03-16 DIAGNOSIS — R0789 Other chest pain: Secondary | ICD-10-CM | POA: Diagnosis not present

## 2020-03-16 DIAGNOSIS — R079 Chest pain, unspecified: Secondary | ICD-10-CM | POA: Diagnosis not present

## 2020-03-16 DIAGNOSIS — E039 Hypothyroidism, unspecified: Secondary | ICD-10-CM | POA: Insufficient documentation

## 2020-03-16 DIAGNOSIS — M25512 Pain in left shoulder: Secondary | ICD-10-CM | POA: Insufficient documentation

## 2020-03-16 DIAGNOSIS — M25519 Pain in unspecified shoulder: Secondary | ICD-10-CM | POA: Diagnosis not present

## 2020-03-16 DIAGNOSIS — I10 Essential (primary) hypertension: Secondary | ICD-10-CM | POA: Diagnosis not present

## 2020-03-16 DIAGNOSIS — I252 Old myocardial infarction: Secondary | ICD-10-CM | POA: Diagnosis not present

## 2020-03-16 DIAGNOSIS — R52 Pain, unspecified: Secondary | ICD-10-CM | POA: Diagnosis not present

## 2020-03-16 LAB — BASIC METABOLIC PANEL
Anion gap: 13 (ref 5–15)
BUN: 19 mg/dL (ref 8–23)
CO2: 27 mmol/L (ref 22–32)
Calcium: 9.5 mg/dL (ref 8.9–10.3)
Chloride: 102 mmol/L (ref 98–111)
Creatinine, Ser: 0.85 mg/dL (ref 0.44–1.00)
GFR calc Af Amer: >60 mL/min (ref >60)
GFR calc non Af Amer: >60 mL/min (ref >60)
Glucose, Bld: 115 mg/dL — ABNORMAL HIGH (ref 70–99)
Potassium: 3.2 mmol/L — ABNORMAL LOW (ref 3.5–5.1)
Sodium: 142 mmol/L (ref 135–145)

## 2020-03-16 LAB — CBC
HCT: 43.2 % (ref 36.0–46.0)
Hemoglobin: 13.9 g/dL (ref 12.0–15.0)
MCH: 30.1 pg (ref 26.0–34.0)
MCHC: 32.2 g/dL (ref 30.0–36.0)
MCV: 93.5 fL (ref 80.0–100.0)
Platelets: 365 10*3/uL (ref 150–400)
RBC: 4.62 MIL/uL (ref 3.87–5.11)
RDW: 12.4 % (ref 11.5–15.5)
WBC: 9.9 10*3/uL (ref 4.0–10.5)
nRBC: 0 % (ref 0.0–0.2)

## 2020-03-16 LAB — TROPONIN I (HIGH SENSITIVITY)
Troponin I (High Sensitivity): 5 ng/L (ref <18)
Troponin I (High Sensitivity): 7 ng/L (ref <18)

## 2020-03-16 NOTE — ED Triage Notes (Signed)
Pt woke up at 4am this morning with left shoulder pain that radiates to her chest, describes as a tightness. Recent MI with stent placement in Sept 2020. Pt given 324 ASA and 1 Nitro with no change in pain. Pt a.o, BP 165/70

## 2020-03-16 NOTE — Telephone Encounter (Signed)
Call was sent in by operator. Patient complaining of having left shoulder pain and chest tightness this morning that was relieved with nitro. Patient stated right now she was not having any chest pain. Patient recently had heart cath 05/2019. Made patient an appointment to see PA this morning at 9:15 am.

## 2020-03-16 NOTE — Telephone Encounter (Signed)
Should have got sent to Dr. Dorris Carnes sent to Arlana Lindau in error

## 2020-03-16 NOTE — Telephone Encounter (Signed)
Per Melina Copa, PA-C, call placed to pt after viewing phone note from pt's call this morning.  Pt has been advised, per Dayna, to call 911 and go to the ED.  Pt verbalized understanding and will call 911. Appt has been cancelled.

## 2020-03-16 NOTE — ED Notes (Signed)
Patient verbalizes understanding of discharge instructions. Opportunity for questioning and answers were provided. Armband removed by staff, pt discharged from ED and left for lobby via wheelchair to leave with daughter.

## 2020-03-16 NOTE — Telephone Encounter (Signed)
Patient added to schedule earlier this morning with complaints of intermittent tightness in chest/shoulder coming and going this morning as well as feeling hot/cold. Has had to take SL NTG. Previous anginal sx was CP relieved with NTG as well when she had NSTEMI. Cath in 05/2019 resulted in DES to mid Cx, also noted to have 40% LAD at that time. Given symptoms concerning for ongoing angina, instead of waiting for outpatient office appt and seeing potential delay in expediting care, have instructed patient to contact 911 for urgent evaluation. She was agreeable. Ocie Stanzione PA-C

## 2020-03-16 NOTE — Discharge Instructions (Addendum)
Your evaluation today is reassuring.  Please call and follow-up closely with your cardiologist for further evaluation of your symptoms.  Return if your symptoms worsen or if you have any other concern.

## 2020-03-16 NOTE — ED Provider Notes (Signed)
[facility] EMERGENCY DEPARTMENT Provider Note   CSN: [id number] Arrival date & time: 03/16/20  0938     History Chief Complaint  Patient presents with  . Shoulder Pain  . Chest Pain    [name] is a 79 y.o. female.  The history is provided by the patient and medical records. No language interpreter was used.     79 year old female with history of recent MI status post stent placement in September 2020, hypertension, hyperlipidemia, anxiety, thyroid disease presenting for evaluation of arm pain.  Patient report she woke up this morning and felt some pain to left shoulder.  Pain is described as a sharp achy sensation, happened intermittently lasting for few seconds each.  She felt hot during these episodes.  She did immediately take a sublingual nitro.  She report pain has since resolved.  She did not complain of any sensation of lightheadedness, dizziness, nausea, chest pain or shortness of breath during this episode.  She did reach out to her cardiologist who recommend patient to come to the ER for further evaluation.  She denies any recent strenuous activities or heavy lifting.  She denies any neck pain, arm weakness fever or chills.  She is on several experimental cholesterol medications and was unsure if it can contribute to it.  She has had a Covid vaccination.  She has history of arthritis.  Past Medical History:  Diagnosis Date  . Abnormal thyroid function test 07/01/2013  . Anxiety   . Arthritis   . Cancer ([place])   . Coronary artery disease   . Dermatitis 11/25/2013  . Glaucoma 11/02/2012  . HTN (hypertension) 11/02/2012  . 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 Hyperlipidemia   . Hypothyroid   . Obesity     Patient Active Problem List   Diagnosis Date Noted  . Statin myopathy 07/01/2019  . Hypokalemia 06/02/2019  . CAD (coronary artery disease) s/p stent mCx 05/2019 06/02/2019  . NSTEMI (non-ST elevated  myocardial infarction) ([place]) 05/31/2019  . Chest pain 12/29/2018  . History of adenomatous polyp of colon 09/30/2018  . Primary open-angle glaucoma 11/20/2017  . Pseudophakia of both eyes 11/20/2017  . Grieving 02/18/2017  . Osteoporosis 11/05/2016  . Medicare annual wellness visit, subsequent 09/26/2014  . Hypothyroidism 07/01/2013  . Hypertension 11/02/2012  . Vitamin D deficiency 11/02/2012  . Hyperlipidemia 07/24/2011  . Obesity (BMI 30-39.9) 07/24/2011  . hx: breast cancer, IDC (Microinvasive) w DCIS, 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 - 07/10/2011    Past Surgical History:  Procedure Laterality Date  . AUGMENTATION MAMMAPLASTY Left    05/2006  . BREAST SURGERY  10/2005   Left Mastectomy, reconstruction, reduction on right. Dr. Harlow Mares  . CARDIAC CATHETERIZATION    . COLONOSCOPY    . CORONARY STENT INTERVENTION N/A 06/01/2019   Procedure: CORONARY STENT INTERVENTION;  Surgeon: Sherren Mocha, MD;  Location: [place] [facility];  Service: Cardiovascular;  Laterality: N/A;  . CRYOTHERAPY     GYN for CIN III  . DILATION AND CURETTAGE OF UTERUS    . EXCISION MORTON'S NEUROMA     right foot  . EYE SURGERY Right 05/08/2018  . LEFT HEART CATH AND CORONARY ANGIOGRAPHY N/A 06/01/2019   Procedure: LEFT HEART CATH AND CORONARY ANGIOGRAPHY;  Surgeon: Sherren Mocha, MD;  Location: [place] [facility];  Service: Cardiovascular;  Laterality: N/A;  . REDUCTION MAMMAPLASTY Right      OB History   No obstetric  history on file.     Family History  Problem Relation Age of Onset  . Pneumonia Mother   . Hyperlipidemia Mother   . Parkinsonism Mother   . CAD Mother   . Other Father        bilateral subdural hematoma  . Colon cancer Paternal Grandmother   . Hyperlipidemia Sister   . Prostate cancer Neg Hx   . Breast cancer Neg Hx   . Heart disease Neg Hx   . Diabetes Neg Hx     Social History   Tobacco Use  . Smoking status: Never Smoker  . Smokeless tobacco: Never Used  Vaping Use  . Vaping  Use: Never used  Substance Use Topics  . Alcohol use: Yes    Comment: Rare  . Drug use: No    Home Medications Prior to Admission medications   Medication Sig Start Date End Date Taking? Authorizing Provider  AMBULATORY NON FORMULARY MEDICATION Take 180 mg by mouth daily. Medication Name: 180 mg bempedoic acid vs a placebo, CLEAR Research Study drug provided 11/01/16   Lelon Perla, MD  amLODipine (NORVASC) 2.5 MG tablet Take 1 tablet (2.5 mg total) by mouth daily. 06/08/19   Fay Records, MD  aspirin EC 81 MG tablet Take 81 mg by mouth daily.    [provider]  Bimatoprost (LUMIGAN OP) Place 1 drop into both eyes at bedtime.     [provider]  chlorthalidone (HYGROTON) 25 MG tablet TAKE 1 TABLET DAILY. 07/27/19   Marin Olp, MD  Cholecalciferol (VITAMIN D-3) 5000 UNITS TABS Take 5,000 mg by mouth daily.    [provider]  dorzolamide-timolol (COSOPT) 22.3-6.8 MG/ML ophthalmic solution Place 1 drop into both eyes 2 (two) times daily.     [provider]  Evolocumab (REPATHA SURECLICK) 741 MG/ML SOAJ Inject 1 pen into the skin every 14 (fourteen) days. 11/06/19   Fay Records, MD  ezetimibe (ZETIA) 10 MG tablet TAKE 1 TABLET EACH DAY. 12/18/19   Marin Olp, MD  famotidine (PEPCID) 20 MG tablet Take 1 tablet (20 mg total) by mouth 2 (two) times daily. 04/29/19   Marin Olp, MD  Icosapent Ethyl (VASCEPA) 1 g CAPS Take 2 capsules (2 g total) by mouth 2 (two) times daily. 07/01/19   Fay Records, MD  levothyroxine (SYNTHROID) 50 MCG tablet TAKE 1 TABLET BEFORE BREAKFAST. 02/25/20   Marin Olp, MD  metoprolol tartrate (LOPRESSOR) 25 MG tablet TAKE 1 TABLET BY MOUTH TWICE DAILY. 02/25/20   Marin Olp, MD  nitroGLYCERIN (NITROSTAT) 0.4 MG SL tablet Place 1 tablet (0.4 mg total) under the tongue every 5 (five) minutes x 3 doses as needed for chest pain. 06/02/19   Evans Lance, MD  pilocarpine (PILOCAR) 1 % ophthalmic  solution Place 1 drop into both eyes 2 (two) times daily.     [provider]  Polyethyl Glycol-Propyl Glycol (SYSTANE OP) Place 1-2 drops into both eyes as needed (for dry eyes).     [provider]  potassium chloride SA (KLOR-CON) 20 MEQ tablet Take 1 tablet (20 mEq total) by mouth daily as needed (if instructed to use for low potassium). 12/09/19   Marin Olp, MD  Probiotic Product (PROBIOTIC DAILY) CAPS Take 1 capsule by mouth daily. Reported on 10/10/2015    [provider]  ticagrelor (BRILINTA) 90 MG TABS tablet Take 1 tablet (90 mg total) by mouth 2 (two) times daily. 06/02/19  Evans Lance, MD    Allergies    Adhesive [tape] and Rosuvastatin  Review of Systems   Review of Systems  All other systems reviewed and are negative.   Physical Exam Updated Vital Signs BP (!) 144/59 (BP Location: Right Arm)   Pulse 75   Temp 97.7 F (36.5 C) (Oral)   Resp (!) 22   Ht 5\' 3"  (1.6 m)   Wt 67.6 kg   SpO2 99%   BMI 26.39 kg/m   Physical Exam Vitals and nursing note reviewed.  Constitutional:      General: She is not in acute distress.    Appearance: She is well-developed.  HENT:     Head: Atraumatic.  Eyes:     Conjunctiva/sclera: Conjunctivae normal.  Cardiovascular:     Rate and Rhythm: Normal rate and regular rhythm.     Heart sounds: Normal heart sounds.  Pulmonary:     Effort: Pulmonary effort is normal.     Breath sounds: Normal breath sounds.  Chest:     Chest wall: No tenderness.  Abdominal:     Palpations: Abdomen is soft.     Tenderness: There is no abdominal tenderness.  Musculoskeletal:        General: Normal range of motion.     Cervical back: Neck supple.     Comments: Palpation of the left shoulder and arm without any focal point tenderness.  Full range of motion throughout, radial pulse 2+ with normal grip strength.  Skin:    Findings: No rash.  Neurological:     Mental Status: She is alert and oriented to person,  place, and time.  Psychiatric:        Mood and Affect: Mood normal.     ED Results / Procedures / Treatments   Labs (all labs ordered are listed, but only abnormal results are displayed) Labs Reviewed  BASIC METABOLIC PANEL - Abnormal; Notable for the following components:      Result Value   Potassium 3.2 (*)    Glucose, Bld 115 (*)    All other components within normal limits  CBC  TROPONIN I (HIGH SENSITIVITY)  TROPONIN I (HIGH SENSITIVITY)    EKG EKG Interpretation  Date/Time:  Wednesday March 16 2020 10:08:52 EDT Ventricular Rate:  67 PR Interval:  174 QRS Duration: 88 QT Interval:  398 QTC Calculation: 420 R Axis:   -3 Text Interpretation: Normal sinus rhythm Minimal voltage criteria for LVH, may be normal variant ( R in aVL ) Borderline ECG Confirmed by Lennice Sites [phone]) on 03/16/2020 5:56:34 PM   Radiology DG Chest 2 View  Result Date: 03/16/2020 CLINICAL DATA:  Chest pain EXAM: CHEST - 2 VIEW COMPARISON:  May 31, 2019 FINDINGS: The lungs are clear. Heart is upper normal in size with pulmonary vascularity normal. No adenopathy. There is aortic atherosclerosis. There is degenerative change in the thoracic spine. No pneumothorax. IMPRESSION: Lungs are clear.  Heart upper normal in size. Aortic Atherosclerosis (ICD10-I70.0). Electronically Signed   By: Lowella Grip III M.D.   On: 03/16/2020 13:13    Procedures Procedures (including critical care time)  Medications Ordered in ED Medications - No data to display  ED Course  I have reviewed the triage vital signs and the nursing notes.  Pertinent labs & imaging results that were available during my care of the patient were reviewed by me and considered in my medical decision making (see chart for details).    MDM Rules/Calculators/A&P  BP (!) 154/62 (BP Location: Right Arm)   Pulse 82   Temp 98.5 F (36.9 C) (Oral)   Resp 20   Ht 5\' 3"  (1.6 m)   Wt 67.6 kg   SpO2 99%    BMI 26.39 kg/m   Final Clinical Impression(s) / ED Diagnoses Final diagnoses:  Acute pain of left shoulder    Rx / DC Orders ED Discharge Orders    None     6:26 PM Patient here with intermittent left shoulder pain that has since resolved.  her heart score is 5 which puts her at moderate risk for MACE.  However, her pain has fully resolved, and repeat troponin XII hours after her pain which was normal.  After discussion with patient and with attending Dr. And through shared decision-making, patient will call and follow-up closely with her cardiologist for outpatient evaluation.  Return precaution discussed.  Her pain is highly atypical of ACS.   Domenic Moras, PA-C 03/16/20 [place], [place], DO 03/16/20 2220

## 2020-03-16 NOTE — Telephone Encounter (Signed)
New message   Pt c/o of Chest Pain: STAT if CP now or developed within 24 hours  1. Are you having CP right now? Pain in tight in chest and shoulder   2. Are you experiencing any other symptoms (ex. SOB, nausea, vomiting, sweating)?patient states that she has been hot and cold   3. How long have you been experiencing CP? Patient states that this started this morning  4. Is your CP continuous or coming and going? Coming and going   5. Have you taken Nitroglycerin?yes  ?

## 2020-03-16 NOTE — ED Provider Notes (Signed)
Medical screening examination/treatment/procedure(s) were conducted as a shared visit with non-physician practitioner(s) and myself.  I personally evaluated the patient during the encounter. Briefly, the patient is a 79 y.o. female with history of CAD hypertension who presents to the ED with left shoulder pain.  Patient with normal vitals.  No fever.  Woke up this morning with left shoulder pain around 4 AM.  Possibly radiated to her chest.  Pain lasted maybe a few minutes and went away.  She felt like her arm fell asleep.  She has not had chest pain since.  She has had a prolonged wait in the emergency department.  EKG shows sinus rhythm.  No new ischemic changes.  She has had 2 normal troponins.  The second troponin being over 12 hours after her initial shoulder pain.  Otherwise lab work is unremarkable with no significant anemia, electrolyte abnormality, kidney injury.  Chest x-ray shows no signs of infection.  Patient does have cardiac risk factors however is pain-free.  Has not had anginal type pain since her cardiac stent last year.  Overall has good energy doing her daily tasks.  Patient does have follow-up with cardiology fairly soon.  Patient prefers to be discharged at this time which I think is reasonable given her overall atypical pain.  She understands return precautions.  Shared decision was made for her to follow-up closely with cardiology outpatient to see if they want to see her sooner.  Patient was discharged in good condition.  No concern for PE or other acute process.  This chart was dictated using voice recognition software.  Despite best efforts to proofread,  errors can occur which can change the documentation meaning.     EKG Interpretation  Date/Time:  Wednesday March 16 2020 10:08:52 EDT Ventricular Rate:  67 PR Interval:  174 QRS Duration: 88 QT Interval:  398 QTC Calculation: 420 R Axis:   -3 Text Interpretation: Normal sinus rhythm Minimal voltage criteria for LVH, may be  normal variant ( R in aVL ) Borderline ECG Confirmed by Lennice Sites [phone]) on 03/16/2020 5:56:34 PM           Lennice Sites, DO 03/16/20 1944

## 2020-03-16 NOTE — Telephone Encounter (Signed)
See additional phone note.  Pt was sent to the ED per Melina Copa, PA-C.

## 2020-03-17 ENCOUNTER — Other Ambulatory Visit: Payer: Self-pay | Admitting: Family Medicine

## 2020-03-18 ENCOUNTER — Ambulatory Visit: Payer: Medicare PPO | Admitting: Family Medicine

## 2020-03-18 ENCOUNTER — Other Ambulatory Visit: Payer: Self-pay

## 2020-03-18 VITALS — BP 108/74 | HR 66 | Ht 63.0 in | Wt 151.0 lb

## 2020-03-18 DIAGNOSIS — M79672 Pain in left foot: Secondary | ICD-10-CM | POA: Diagnosis not present

## 2020-03-18 NOTE — Progress Notes (Signed)
   Fontaine No, am serving as a scribe for Dr. Lynne Leader This visit occurred during the SARS-CoV-2 public health emergency.  Safety protocols were in place, including screening questions prior to the visit, additional usage of staff PPE, and extensive cleaning of exam room while observing appropriate contact time as indicated for disinfecting solutions.   02/26/2020 79 y.o. female with left midfoot pain after stubbing her toe.  No fractures per my interpretation however radiology overread is still pending.  Plan for postop shoe and relative rest along with Voltaren gel.  Check back in a few weeks if not improving.Marland Kitchen  Update 03/18/2020 SURI TAFOLLA is a 79 y.o. female who presents to [place] at [facility] today for left foot pain. Patient states that her pain has improved. Pain increases with weather changes.  Patient also has a lot of questions about what exercises she can do or should be doing.  She has a history of working with a Physiological scientist prior to COVID-19.  She also has an exercise bike at home.  Additionally she notes that she is planning to moving to [facility] at some point in the near future.  Pertinent review of systems: No fevers or chills  Relevant historical information: History NSTEMI and CAD   Exam:  BP 108/74   Pulse 66   Ht 5\' 3"  (1.6 m)   Wt 151 lb (68.5 kg)   SpO2 99%   BMI 26.75 kg/m  General: Well Developed, well nourished, and in no acute distress.   MSK: Left foot degenerative appearing nontender no swelling normal gait.      Assessment and Plan: 79 y.o. female with left foot pain significant improvement with a bit of rest and time.  Watchful waiting for this issue in the future.  Fitness and exercise: Recommend regular exercise.  Recommend combination of cardio aerobic and resistance training.  She already has some of the tools in place to succeed including a relationship with a personal trainer which is a  great idea and an exercise bike which is also a good idea.  Recommend water aerobics if possible.  Her health insurance pays for Silver sneakers and I believe she will be able to get a membership at the [facility] for free.  Discussed exercise strategies and fall risk prevention strategies as well.     Discussed warning signs or symptoms. Please see discharge instructions. Patient expresses understanding.   The above documentation has been reviewed and is accurate and complete Lynne Leader, M.D. Total encounter time 20 minutes including charting time date of service.

## 2020-03-18 NOTE — Patient Instructions (Signed)
Thank you for coming in today. Plan to continue exercise.  Continue the bike.  Consider water aerobics. Often [employer] will partner with silver sne[employer] to allow membership.  Recheck back as needed.  Consider resistance training (weight lifting) at the gym or with a personal trainer or both.  Limited walking.

## 2020-04-15 ENCOUNTER — Other Ambulatory Visit: Payer: Self-pay | Admitting: Family Medicine

## 2020-04-15 DIAGNOSIS — Z1231 Encounter for screening mammogram for malignant neoplasm of breast: Secondary | ICD-10-CM

## 2020-04-25 ENCOUNTER — Other Ambulatory Visit: Payer: Self-pay

## 2020-04-25 ENCOUNTER — Ambulatory Visit (INDEPENDENT_AMBULATORY_CARE_PROVIDER_SITE_OTHER): Payer: Medicare PPO | Admitting: Internal Medicine

## 2020-04-25 ENCOUNTER — Encounter: Payer: Self-pay | Admitting: Internal Medicine

## 2020-04-25 VITALS — BP 126/64 | HR 79 | Ht 63.0 in | Wt 149.0 lb

## 2020-04-25 DIAGNOSIS — I2510 Atherosclerotic heart disease of native coronary artery without angina pectoris: Secondary | ICD-10-CM | POA: Diagnosis not present

## 2020-04-25 DIAGNOSIS — E782 Mixed hyperlipidemia: Secondary | ICD-10-CM | POA: Diagnosis not present

## 2020-04-25 NOTE — Patient Instructions (Addendum)
Medication Instructions:  No changes today.  *If you need a refill on your cardiac medications before your next appointment, please call your pharmacy*   Lab Work: none If you have labs (blood work) drawn today and your tests are completely normal, you will receive your results only by: Marland Kitchen MyChart Message (if you have MyChart) OR . A paper copy in the mail If you have any lab test that is abnormal or we need to change your treatment, we will call you to review the results.   Testing/Procedures: none   Follow-Up: At [facility], you and your health needs are our priority.  As part of our continuing mission to provide you with exceptional heart care, we have created designated Provider Care Teams.  These Care Teams include your primary Cardiologist (physician) and Advanced Practice Providers (APPs -  Physician Assistants and Nurse Practitioners) who all work together to provide you with the care you need, when you need it.  Your next appointment:   6 month(s)  The format for your next appointment:   In Person  Provider:   You may see Dorris Carnes, MD or one of the following Advanced Practice Providers on your designated Care Team:    Richardson Dopp, PA-C  Robbie Lis, [place]    Other Instructions  Addendum:  Called patient and advised that per Dr. Harrington Challenger and Fuller Canada, PharmD, continue Repatha.  Redness at injection site is localized and is okay, not a reason to stop the medication.  Pt verbalizes understanding and thanked me for the call.  [place], RN 04/28/20 12:08 pm.

## 2020-04-25 NOTE — Progress Notes (Signed)
Cardiology Office Note   Date:  04/25/2020   ID:  [name], DOB Aug 26, 1941, MRN [id number]  PCP:  Alexandra Olp, MD  Cardiologist:   Alexandra Carnes, MD   F/U of CAD    History of Present Illness: [name] is a 79 y.o. female with a history of HTN, HL and  CAD  Shewas admitted in April 2020 with CP   CT chest/abd showed extensive atherosclerosis.  Cardac CT showed no obstructive lesons    In September2020 she was admtted with an NSTEMI.  Cardiac catheterization showed severe single-vessel disease with a critical stenosis of the left circumflex.  She underwent PTCA/DES to this area.  There was nonobstructive disease noted in the LAD.  Plan for dual antiplatelet therapy for 12 months.  The patient was enrolled in the clear outcomes trial she has been since 2018 her LDL has trended down from 222 128 she was seen in October by Fuller Canada and Praluent was added along with Vascepa.  She discontinued DHEA and krill oil.      Since I saw her in clinic she says her breathing is OK   She denies CP like she had back in 2020   She was seen in ED on 7/7 with L shoulder pain    Trop neg   Sent home   Has not had any of this since  The pt is currently on Repatha  She says after the injection she will get redness and swelling at the site          Current Meds  Medication Sig  . AMBULATORY NON FORMULARY MEDICATION Take 180 mg by mouth daily. Medication Name: 180 mg bempedoic acid vs a placebo, CLEAR Research Study drug provided  . amLODipine (NORVASC) 2.5 MG tablet Take 1 tablet (2.5 mg total) by mouth daily.  Marland Kitchen aspirin EC 81 MG tablet Take 81 mg by mouth daily.  . Bimatoprost (LUMIGAN OP) Place 1 drop into both eyes at bedtime.   . chlorthalidone (HYGROTON) 25 MG tablet TAKE 1 TABLET DAILY.  Marland Kitchen Cholecalciferol (VITAMIN D-3) 5000 UNITS TABS Take 5,000 mg by mouth daily.  . dorzolamide-timolol (COSOPT) 22.3-6.8 MG/ML ophthalmic solution Place 1 drop into both eyes 2 (two) times  daily.   . Evolocumab (REPATHA SURECLICK) 009 MG/ML SOAJ Inject 1 pen into the skin every 14 (fourteen) days.  Marland Kitchen ezetimibe (ZETIA) 10 MG tablet TAKE 1 TABLET EACH DAY.  . famotidine (PEPCID) 20 MG tablet Take 1 tablet (20 mg total) by mouth 2 (two) times daily.  Vanessa Kick Ethyl (VASCEPA) 1 g CAPS Take 2 capsules (2 g total) by mouth 2 (two) times daily.  Marland Kitchen levothyroxine (SYNTHROID) 50 MCG tablet TAKE 1 TABLET BEFORE BREAKFAST.  . metoprolol tartrate (LOPRESSOR) 25 MG tablet TAKE 1 TABLET BY MOUTH TWICE DAILY.  . nitroGLYCERIN (NITROSTAT) 0.4 MG SL tablet Place 1 tablet (0.4 mg total) under the tongue every 5 (five) minutes x 3 doses as needed for chest pain.  . pilocarpine (PILOCAR) 1 % ophthalmic solution Place 1 drop into both eyes 2 (two) times daily.   Vladimir Faster Glycol-Propyl Glycol (SYSTANE OP) Place 1-2 drops into both eyes as needed (for dry eyes).   . Probiotic Product (PROBIOTIC DAILY) CAPS Take 1 capsule by mouth daily. Reported on 10/10/2015  . ticagrelor (BRILINTA) 90 MG TABS tablet Take 1 tablet (90 mg total) by mouth 2 (two) times daily.     Allergies:   Adhesive [tape] and Rosuvastatin  Past Medical History:  Diagnosis Date  . Abnormal thyroid function test 07/01/2013  . Anxiety   . Arthritis   . Cancer ([place])   . Coronary artery disease   . Dermatitis 11/25/2013  . Glaucoma 11/02/2012  . HTN (hypertension) 11/02/2012  . 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 Hyperlipidemia   . Hypothyroid   . Obesity     Past Surgical History:  Procedure Laterality Date  . AUGMENTATION MAMMAPLASTY Left    05/2006  . BREAST SURGERY  10/2005   Left Mastectomy, reconstruction, reduction on right. Dr. Harlow Mares  . CARDIAC CATHETERIZATION    . COLONOSCOPY    . CORONARY STENT INTERVENTION N/A 06/01/2019   Procedure: CORONARY STENT INTERVENTION;  Surgeon: Sherren Mocha, MD;  Location: [facility];  Service: Cardiovascular;  Laterality:  N/A;  . CRYOTHERAPY     GYN for CIN III  . DILATION AND CURETTAGE OF UTERUS    . EXCISION MORTON'S NEUROMA     right foot  . EYE SURGERY Right 05/08/2018  . LEFT HEART CATH AND CORONARY ANGIOGRAPHY N/A 06/01/2019   Procedure: LEFT HEART CATH AND CORONARY ANGIOGRAPHY;  Surgeon: Sherren Mocha, MD;  Location: [facility];  Service: Cardiovascular;  Laterality: N/A;  . REDUCTION MAMMAPLASTY Right      Social History:  The patient  reports that she has never smoked. She has never used smokeless tobacco. She reports current alcohol use. She reports that she does not use drugs.   Family History:  The patient's family history includes CAD in her mother; Colon cancer in her paternal grandmother; Hyperlipidemia in her mother and sister; Other in her father; Parkinsonism in her mother; Pneumonia in her mother.    ROS:  Please see the history of present illness. All other systems are reviewed and  Negative to the above problem except as noted.    PHYSICAL EXAM: VS:  BP 126/64   Pulse 79   Ht 5\' 3"  (1.6 m)   Wt 149 lb (67.6 kg)   SpO2 97%   BMI 26.39 kg/m   GEN: Pt is a 79 yo female in no acute distress  HEENT: normal  Neck: no JVD, carotid bruits Cardiac: RRR; no murmurs No LE edema  Respiratory:  clear to auscultation bilaterally GI: soft, nontender, nondistended, + BS  No hepatomegaly  MS: no deformity Moving all extremities   Skin: warm and dry, no rash Neuro:  Strength and sensation are intact Psych: euthymic mood, full affect   EKG:  EKG is not ordered today.   Lipid Panel    Component Value Date/Time   CHOL 112 09/21/2019 0908   TRIG 77 09/21/2019 0908   HDL 56 09/21/2019 0908   CHOLHDL 2.0 09/21/2019 0908   CHOLHDL 2 09/02/2019 0828   VLDL 21.8 09/02/2019 0828   LDLCALC 40 09/21/2019 0908   LDLDIRECT 114.0 04/29/2019 1102      Wt Readings from Last 3 Encounters:  04/25/20 149 lb (67.6 kg)  03/18/20 151 lb (68.5 kg)  03/16/20 149 lb (67.6 kg)     Cardiac Studies  CORONARY STENT INTERVENTION 05/2019  LEFT HEART CATH AND CORONARY ANGIOGRAPHY  Conclusion  1) Severe single vessel CAD with critical stenosis of the mid-circumflex, treated successfully with PTCA and stenting (2.5x12 mm Synergy DES 2) nonobstructive LAD stenosis 3) widely patent RCA, dominant vessel 4) normal LV systolic function  Recommend: DAPT with ASA and ticagrelor x 12 months without  interruption. Pt loaded with ticagrelor 180 mg on the cath lab table. DC home tomorrow am if no complications arise.      ASSESSMENT AND PLAN: 1  CAD Pt without angina.   I do not think the episode of L shoulder pain represents angina  Very different from what she experienced in fall 2020   FOllow for now      She is noww approaching 1 year form intervention   WIll review with research team   .  2  HTN  BP is contrlled    3  HL Last lipids in system LDL 40  HDL 56   Will review with pharmacy Repatha and arm swelling   4.  PAD atherosclerosis of aorta noted. Pt without symptoms        Current medicines are reviewed at length with the patient today.  The patient does not have concerns regarding medicines.  Signed, Alexandra Carnes, MD  04/25/2020 3:57 PM    [address], [place], [place]  [address] Phone: [phone]; Fax: [phone]

## 2020-04-28 ENCOUNTER — Other Ambulatory Visit: Payer: Self-pay | Admitting: Family Medicine

## 2020-04-28 ENCOUNTER — Ambulatory Visit
Admission: RE | Admit: 2020-04-28 | Discharge: 2020-04-28 | Disposition: A | Discharge status: Home / Self Care | Payer: Medicare PPO | Source: Ambulatory Visit | Attending: Family Medicine | Admitting: Family Medicine

## 2020-04-28 DIAGNOSIS — Z1231 Encounter for screening mammogram for malignant neoplasm of breast: Secondary | ICD-10-CM

## 2020-05-11 ENCOUNTER — Other Ambulatory Visit: Payer: Self-pay | Admitting: Family Medicine

## 2020-05-11 ENCOUNTER — Other Ambulatory Visit: Payer: Self-pay | Admitting: Internal Medicine

## 2020-05-11 NOTE — Patient Instructions (Addendum)
Health Maintenance Due  Topic Date Due  . Hepatitis C Screening-with labs today Never done  . INFLUENZA VACCINE -high-dose flu shot today 04/10/2020   Schedule follow up with dermatology  Id also love for you to sign up for your yearly wellness visit with Otila Kluver (our nurse) before you leave   Please stop by lab before you go If you have mychart- we will send your results within 3 business days of [place] receiving them.  If you do not have mychart- we will call you about results within 5 business days of [place] receiving them.  *please note we are currently using [facility] which has a longer processing time than [place] typically so labs may not come back as quickly as in the past *please also note that you will see labs on mychart as soon as they post. I will later go in and write notes on them- will say "notes from Dr. Yong Channel"   Influenza (Flu) Vaccine (Inactivated or Recombinant): What You Need to Know 1. Why get vaccinated? Influenza vaccine can prevent influenza (flu). Flu is a contagious disease that spreads around the [place] every year, usually between October and May. Anyone can get the flu, but it is more dangerous for some people. Infants and young children, people 95 years of age and older, pregnant women, and people with certain health conditions or a weakened immune system are at greatest risk of flu complications. Pneumonia, bronchitis, sinus infections and ear infections are examples of flu-related complications. If you have a medical condition, such as heart disease, cancer or diabetes, flu can make it worse. Flu can cause fever and chills, sore throat, muscle aches, fatigue, cough, headache, and runny or stuffy nose. Some people may have vomiting and diarrhea, though this is more common in children than adults. Each year thousands of people in the [place] die from flu, and many more are hospitalized. Flu vaccine prevents millions of illnesses and flu-related visits to the  doctor each year. 2. Influenza vaccine CDC recommends everyone 90 months of age and older get vaccinated every flu season. Children 6 months through 53 years of age may need 2 doses during a single flu season. Everyone else needs only 1 dose each flu season. It takes about 2 weeks for protection to develop after vaccination. There are many flu viruses, and they are always changing. Each year a new flu vaccine is made to protect against three or four viruses that are likely to cause disease in the upcoming flu season. Even when the vaccine doesn't exactly match these viruses, it may still provide some protection. Influenza vaccine does not cause flu. Influenza vaccine may be given at the same time as other vaccines. 3. Talk with your health care provider Tell your vaccine provider if the person getting the vaccine:  Has had an allergic reaction after a previous dose of influenza vaccine, or has any severe, life-threatening allergies.  Has ever had Guillain-Barr Syndrome (also called GBS). In some cases, your health care provider may decide to postpone influenza vaccination to a future visit. People with minor illnesses, such as a cold, may be vaccinated. People who are moderately or severely ill should usually wait until they recover before getting influenza vaccine. Your health care provider can give you more information. 4. Risks of a vaccine reaction  Soreness, redness, and swelling where shot is given, fever, muscle aches, and headache can happen after influenza vaccine.  There may be a very small increased risk of Guillain-Barr  Syndrome (GBS) after inactivated influenza vaccine (the flu shot). Young children who get the flu shot along with pneumococcal vaccine (PCV13), and/or DTaP vaccine at the same time might be slightly more likely to have a seizure caused by fever. Tell your health care provider if a child who is getting flu vaccine has ever had a seizure. People sometimes faint after  medical procedures, including vaccination. Tell your provider if you feel dizzy or have vision changes or ringing in the ears. As with any medicine, there is a very remote chance of a vaccine causing a severe allergic reaction, other serious injury, or death. 5. What if there is a serious problem? An allergic reaction could occur after the vaccinated person leaves the clinic. If you see signs of a severe allergic reaction (hives, swelling of the face and throat, difficulty breathing, a fast heartbeat, dizziness, or weakness), call 9-1-1 and get the person to the nearest hospital. For other signs that concern you, call your health care provider. Adverse reactions should be reported to the Vaccine Adverse Event Reporting System (VAERS). Your health care provider will usually file this report, or you can do it yourself. Visit the VAERS website at www.vaers.SamedayNews.es or call 510 766 9286.VAERS is only for reporting reactions, and VAERS staff do not give medical advice. 6. The National Vaccine Injury Compensation Program The [employer] Vaccine Injury Compensation Program (VICP) is a federal program that was created to compensate people who may have been injured by certain vaccines. Visit the VICP website at GoldCloset.com.ee or call 712-516-2400 to learn about the program and about filing a claim. There is a time limit to file a claim for compensation. 7. How can I learn more?  Ask your healthcare provider.  Call your local or state health department.  Contact the Centers for Disease Control and Prevention (CDC): ? Call (630)635-9466 (1-800-CDC-INFO) or ? Visit CDC's https://gibson.com/ Vaccine Information Statement (Interim) Inactivated Influenza Vaccine (04/24/2018) This information is not intended to replace advice given to you by your health care provider. Make sure you discuss any questions you have with your health care provider. Document Revised: 12/16/2018 Document Reviewed:  04/28/2018 Elsevier Patient Education  [place].

## 2020-05-11 NOTE — Progress Notes (Addendum)
Phone [phone]   Subjective:  Patient presents today for their annual physical. Chief complaint-noted.    See problem oriented charting- Review of Systems  Constitutional: Negative for chills and fever.  HENT: Negative for hearing loss and tinnitus.   Eyes: Negative for blurred vision and double vision.  Respiratory: Negative for cough, shortness of breath and wheezing.   Cardiovascular: Negative for chest pain, palpitations and leg swelling.  Gastrointestinal: Positive for heartburn. Negative for nausea and vomiting.       Medications help with symptoms    Genitourinary: Negative for dysuria, frequency and urgency.  Musculoskeletal: Negative for back pain, joint pain and neck pain.  Skin: Positive for rash.       Due to medications at injection site has been evaluated by cardio and told to stay on.   Neurological: Negative for dizziness, seizures and headaches.  Endo/Heme/Allergies: Bruises/bleeds easily.       Due to medications   Psychiatric/Behavioral: Negative for depression, memory loss and suicidal ideas. The patient does not have insomnia.    The following were reviewed and entered/updated in epic: Past Medical History:  Diagnosis Date  . Abnormal thyroid function test 07/01/2013  . Anxiety   . Arthritis   . Cancer ([place])   . Coronary artery disease   . Dermatitis 11/25/2013  . Glaucoma 11/02/2012  . HTN (hypertension) 11/02/2012  . 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 Hyperlipidemia   . Hypothyroid   . Obesity    Patient Active Problem List   Diagnosis Date Noted  . CAD (coronary artery disease) s/p stent mCx 05/2019 06/02/2019    Priority: High  . Grieving 02/18/2017    Priority: High  . Osteoporosis 11/05/2016    Priority: High  . Chest pain 12/29/2018    Priority: Medium  . Primary open-angle glaucoma 11/20/2017    Priority: Medium  . Hypothyroidism 07/01/2013    Priority: Medium  . Hypertension 11/02/2012      Priority: Medium  . Hyperlipidemia 07/24/2011    Priority: Medium  . 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 History of adenomatous polyp of colon 09/30/2018    Priority: Low  . Medicare annual wellness visit, subsequent 09/26/2014    Priority: Low  . Vitamin D deficiency 11/02/2012    Priority: Low  . Obesity (BMI 30-39.9) 07/24/2011    Priority: Low  . Statin myopathy 07/01/2019  . Hypokalemia 06/02/2019  . NSTEMI (non-ST elevated myocardial infarction) ([place]) 05/31/2019  . Pseudophakia of both eyes 11/20/2017   Past Surgical History:  Procedure Laterality Date  . AUGMENTATION MAMMAPLASTY Left    05/2006  . BREAST SURGERY  10/2005   Left Mastectomy, reconstruction, reduction on right. Dr. Harlow Mares  . CARDIAC CATHETERIZATION    . COLONOSCOPY    . CORONARY STENT INTERVENTION N/A 06/01/2019   Procedure: CORONARY STENT INTERVENTION;  Surgeon: Sherren Mocha, MD;  Location: [facility];  Service: Cardiovascular;  Laterality: N/A;  . CRYOTHERAPY     GYN for CIN III  . DILATION AND CURETTAGE OF UTERUS    . EXCISION MORTON'S NEUROMA     right foot  . EYE SURGERY Right 05/08/2018  . LEFT HEART CATH AND CORONARY ANGIOGRAPHY N/A 06/01/2019   Procedure: LEFT HEART CATH AND CORONARY ANGIOGRAPHY;  Surgeon: Sherren Mocha, MD;  Location: [facility];  Service: Cardiovascular;  Laterality: N/A;  .  MASTECTOMY    . REDUCTION MAMMAPLASTY Right     Family History  Problem Relation Age of Onset  . Pneumonia Mother   . Hyperlipidemia Mother   . Parkinsonism Mother   . CAD Mother   . Other Father        bilateral subdural hematoma  . Colon cancer Paternal Grandmother   . Hyperlipidemia Sister   . Prostate cancer Neg Hx   . Breast cancer Neg Hx   . Heart disease Neg Hx   . Diabetes Neg Hx     Medications- reviewed and updated Current Outpatient Medications  Medication Sig Dispense Refill  . AMBULATORY NON  FORMULARY MEDICATION Take 180 mg by mouth daily. Medication Name: 180 mg bempedoic acid vs a placebo, CLEAR Research Study drug provided    . amLODipine (NORVASC) 2.5 MG tablet Take 1 tablet (2.5 mg total) by mouth daily. 90 tablet 3  . aspirin EC 81 MG tablet Take 81 mg by mouth daily.    . Bimatoprost (LUMIGAN OP) Place 1 drop into both eyes at bedtime.     . chlorthalidone (HYGROTON) 25 MG tablet TAKE 1 TABLET DAILY. 90 tablet 3  . Cholecalciferol (VITAMIN D-3) 5000 UNITS TABS Take 5,000 mg by mouth daily.    . dorzolamide-timolol (COSOPT) 22.3-6.8 MG/ML ophthalmic solution Place 1 drop into both eyes 2 (two) times daily.     . Evolocumab (REPATHA SURECLICK) 333 MG/ML SOAJ Inject 1 pen into the skin every 14 (fourteen) days. 2 pen 11  . ezetimibe (ZETIA) 10 MG tablet TAKE 1 TABLET EACH DAY. 90 tablet 0  . famotidine (PEPCID) 20 MG tablet TAKE 1 TABLET BY MOUTH TWICE DAILY. 180 tablet 0  . Icosapent Ethyl (VASCEPA) 1 g CAPS Take 2 capsules (2 g total) by mouth 2 (two) times daily. 120 capsule 11  . levothyroxine (SYNTHROID) 50 MCG tablet TAKE 1 TABLET BEFORE BREAKFAST. 90 tablet 0  . metoprolol tartrate (LOPRESSOR) 25 MG tablet TAKE 1 TABLET BY MOUTH TWICE DAILY. 180 tablet 0  . nitroGLYCERIN (NITROSTAT) 0.4 MG SL tablet Place 1 tablet (0.4 mg total) under the tongue every 5 (five) minutes x 3 doses as needed for chest pain. 25 tablet 12  . pilocarpine (PILOCAR) 1 % ophthalmic solution Place 1 drop into both eyes 2 (two) times daily.     Vladimir Faster Glycol-Propyl Glycol (SYSTANE OP) Place 1-2 drops into both eyes as needed (for dry eyes).     . Probiotic Product (PROBIOTIC DAILY) CAPS Take 1 capsule by mouth daily. Reported on 10/10/2015    . ticagrelor (BRILINTA) 90 MG TABS tablet Take 1 tablet (90 mg total) by mouth 2 (two) times daily. 60 tablet 12   No current facility-administered medications for this visit.    Allergies-reviewed and updated Allergies  Allergen Reactions  . Adhesive  [Tape] Other (See Comments)    Causes skin to turn red where touched  . Rosuvastatin Other (See Comments)    Joint & muscle aches, sleep issues.    Social History   Social History Narrative   Married 1981. 1 daughter, 2 step sons. 4 grandchildren      Oncologist      Hobbies: going out to eat, activities with church including cancer support group at church   Objective  Objective:  BP 136/72   Pulse (!) 49   Temp 97.6 F (36.4 C) (Temporal)   Ht 5\' 3"  (1.6 m)   Wt 150 lb (68 kg)   SpO2 99%  BMI 26.57 kg/m  Gen: NAD, resting comfortably HEENT: Mucous membranes are moist. Oropharynx normal Breasts: normal appearance, no masses or tenderness on right, mastectomy on left with saline filler- no abnormalities Neck: no thyromegaly CV: Slightly bradycardic but regular rhythm, no murmurs rubs or gallops Lungs: CTAB no crackles, wheeze, rhonchi Abdomen: soft/nontender/nondistended/normal bowel sounds. No rebound or guarding.  Ext: no edema Skin: warm, dry Neuro: grossly normal, moves all extremities, PERRLA   Assessment and Plan   79 y.o. female presenting for annual physical.  Health Maintenance counseling: 1. Anticipatory guidance: Patient counseled regarding regular dental exams q6 months- typically goes every 3 to 6 months, eye exams-yearly or every 6 months,  avoiding smoking and second hand smoke , limiting alcohol to 1 beverage per day occasionally once a month may have small glass of wine.    2. Risk factor reduction:  Advised patient of need for regular exercise and diet rich and fruits and vegetables to reduce risk of heart attack and stroke. Exercise- rides stationary bike for 20 minutes a day.  Diet-followed by nutritionist  Every 3 weeks. Limited red meat.  Weight down 10 pounds from last year-has done an excellent job with maintaining this Wt Readings from Last 3 Encounters:  05/12/20 150 lb (68 kg)  04/25/20 149 lb (67.6 kg)  03/18/20 151 lb (68.5 kg)  3.  Immunizations/screenings/ancillary studies-recommended one-time lifetime hepatitis C screening-patient opts in.  Also option for high-dose flu shot  Immunization History  Administered Date(s) Administered  . Fluad Quad(high Dose 65+) 04/29/2019  . Influenza Split 07/24/2011  . Influenza Whole 06/15/2009  . Influenza, High Dose Seasonal PF 06/29/2013, 07/05/2015, 05/10/2016, 05/21/2017, 06/18/2018  . Influenza,inj,Quad PF,6+ Mos 06/21/2014, 06/14/2016  . PFIZER SARS-COV-2 Vaccination 10/05/2019, 10/26/2019  . Pneumococcal Conjugate-13 04/15/2014  . Pneumococcal Polysaccharide-23 07/17/2000, 10/02/2016  . Tdap 07/17/2010  . Zoster Recombinat (Shingrix) 10/15/2018, 03/17/2019  4. Cervical cancer screening- had a normal Pap smear at age 7-would not advise repeat 5. Breast cancer screening and patient with history of breast cancer-  breast exam declined and mammogram 04/28/2020 6. Colon cancer screening -  up to date due on 09-23-2023-last colonoscopy September 22, 2018 with consideration of 5-year follow-up due to polyp history 7. Skin cancer screening-have discussed following up with Dr. Martinique- she is going to call. advised regular sunscreen use.  8. Birth control/STD check-  Declined not sexually active.  9. Osteoporosis screening at 65-see discussion below -never  smoker  Status of chronic or acute concerns   #Osteoporosis S: Medication: Vitamin D 5000 units.  Calcium upsets stomach Last vitamin D Lab Results  Component Value Date   VD25OH 43.55 04/29/2019  A/P:  DEXA last done on 2018.  Patient has not tolerated Prolia due to achiness and has had a lot of dental issues so not thought to be an ideal candidate for bisphosphonates.  Offered repeat this year and consideration of referral to endocrine for their opinion-patient opts out for now- wants to discuss again in 4 months.    #CAD-follows with Dr. Harrington Challenger recently seen 04/25/2020 #hyperlipidemia S: Medication: Zetia 10 mg and Vascepa.   Also on injectable repatha (very slight rash with this).  Also on aspirin and Brilinta.  Patient denies chest pain or shortness of breath Lab Results  Component Value Date   CHOL 112 09/21/2019   HDL 56 09/21/2019   LDLCALC 40 09/21/2019   LDLDIRECT 114.0 04/29/2019   TRIG 77 09/21/2019   CHOLHDL 2.0 09/21/2019   A/P: CAD asymptomatic-continue current  medications  Hyperlipidemia-check lipid panel today-discussed potential charge for lipids as under a year from last lipids but she prefers full lipids  #hypothyroidism S: compliant On thyroid medication-Levothyroxine 50Mcg Lab Results  Component Value Date   TSH 2.00 12/08/2019  A/P:hopefully controlled- update today   #hypertension S: medication: Amlodipine 2.5Mg ,  Chlorthalidone 25Mg , metoprolol 25Mg  extended release Home readings #s:  Readings looking good at home. Cuff comparable today.  BP Readings from Last 3 Encounters:  05/12/20 136/72  04/25/20 126/64  03/18/20 108/74  A/P: Stable. Continue current medications.   #Left foot pain-improved with rest/time. Thought to be arthritis  Dr. Georgina Snell recommended aerobic and resistance training-has a relationship with a personal trainer which he encouraged her to move forward with and to consider water aerobics. She hasnt started yet.   # gerd- pepcid working ok for her- more ideal than PPI with osteoporosis  Recommended follow up: Return in about 4 months (around 09/11/2020) for follow up- or sooner if needed. Future Appointments  Date Time Provider [place]  06/08/2020  9:00 AM [place] [facility] None  10/27/2020  8:00 AM Fay Records, MD [facility]   Lab/Order associations: not fasting   ICD-10-CM   1. Preventative health care  Z00.00   2. Essential hypertension  I10   3. Hypothyroidism, unspecified type  E03.9   4. Mixed hyperlipidemia  E78.2   5. Vitamin D deficiency  E55.9   6. Encounter for hepatitis C screening test for  low risk patient  Z11.59 Hepatitis C antibody    No orders of the defined types were placed in this encounter.   Return precautions advised.  Garret Reddish, MD

## 2020-05-12 ENCOUNTER — Other Ambulatory Visit: Payer: Self-pay

## 2020-05-12 ENCOUNTER — Encounter: Payer: Self-pay | Admitting: Family Medicine

## 2020-05-12 ENCOUNTER — Ambulatory Visit (INDEPENDENT_AMBULATORY_CARE_PROVIDER_SITE_OTHER): Payer: Medicare PPO | Admitting: Family Medicine

## 2020-05-12 VITALS — BP 136/72 | HR 49 | Temp 97.6°F | Ht 63.0 in | Wt 150.0 lb

## 2020-05-12 DIAGNOSIS — E782 Mixed hyperlipidemia: Secondary | ICD-10-CM

## 2020-05-12 DIAGNOSIS — M810 Age-related osteoporosis without current pathological fracture: Secondary | ICD-10-CM

## 2020-05-12 DIAGNOSIS — Z0000 Encounter for general adult medical examination without abnormal findings: Secondary | ICD-10-CM | POA: Diagnosis not present

## 2020-05-12 DIAGNOSIS — I10 Essential (primary) hypertension: Secondary | ICD-10-CM | POA: Diagnosis not present

## 2020-05-12 DIAGNOSIS — Z1159 Encounter for screening for other viral diseases: Secondary | ICD-10-CM

## 2020-05-12 DIAGNOSIS — E039 Hypothyroidism, unspecified: Secondary | ICD-10-CM | POA: Diagnosis not present

## 2020-05-12 DIAGNOSIS — Z23 Encounter for immunization: Secondary | ICD-10-CM

## 2020-05-12 MED ORDER — FAMOTIDINE 20 MG PO TABS: 20.0000 mg | ORAL_TABLET | Freq: Two times a day (BID) | ORAL | 3 refills | Status: DC

## 2020-05-12 NOTE — Addendum Note (Signed)
Addended by: Liliane Channel on: 05/12/2020 11:35 AM   Modules accepted: Orders

## 2020-05-13 LAB — COMPLETE METABOLIC PANEL WITH GFR
AG Ratio: 2.3 (calc) (ref 1.0–2.5)
ALT: 15 U/L (ref 6–29)
AST: 16 U/L (ref 10–35)
Albumin: 4.8 g/dL (ref 3.6–5.1)
Alkaline phosphatase (APISO): 48 U/L (ref 37–153)
BUN: 19 mg/dL (ref 7–25)
CO2: 29 mmol/L (ref 20–32)
Calcium: 9.8 mg/dL (ref 8.6–10.4)
Chloride: 100 mmol/L (ref 98–110)
Creat: 0.85 mg/dL (ref 0.60–0.93)
GFR, Est African American: 76 mL/min/{1.73_m2} (ref >=60)
GFR, Est Non African American: 66 mL/min/{1.73_m2} (ref >=60)
Globulin: 2.1 g/dL (calc) (ref 1.9–3.7)
Glucose, Bld: 83 mg/dL (ref 65–99)
Potassium: 3.2 mmol/L — ABNORMAL LOW (ref 3.5–5.3)
Sodium: 141 mmol/L (ref 135–146)
Total Bilirubin: 0.9 mg/dL (ref 0.2–1.2)
Total Protein: 6.9 g/dL (ref 6.1–8.1)

## 2020-05-13 LAB — CBC WITH DIFFERENTIAL/PLATELET
Absolute Monocytes: 681 cells/uL (ref 200–950)
Basophils Absolute: 108 cells/uL (ref 0–200)
Basophils Relative: 1.3 %
Eosinophils Absolute: 174 cells/uL (ref 15–500)
Eosinophils Relative: 2.1 %
HCT: 40.9 % (ref 35.0–45.0)
Hemoglobin: 13.2 g/dL (ref 11.7–15.5)
Lymphs Abs: 1884 cells/uL (ref 850–3900)
MCH: 29.7 pg (ref 27.0–33.0)
MCHC: 32.3 g/dL (ref 32.0–36.0)
MCV: 91.9 fL (ref 80.0–100.0)
MPV: 10.9 fL (ref 7.5–12.5)
Monocytes Relative: 8.2 %
Neutro Abs: 5453 cells/uL (ref 1500–7800)
Neutrophils Relative %: 65.7 %
Platelets: 384 10*3/uL (ref 140–400)
RBC: 4.45 10*6/uL (ref 3.80–5.10)
RDW: 11.8 % (ref 11.0–15.0)
Total Lymphocyte: 22.7 %
WBC: 8.3 10*3/uL (ref 3.8–10.8)

## 2020-05-13 LAB — VITAMIN D 25 HYDROXY (VIT D DEFICIENCY, FRACTURES): Vit D, 25-Hydroxy: 45 ng/mL (ref 30–100)

## 2020-05-13 LAB — LIPID PANEL (REFL)
Cholesterol: 115 mg/dL (ref <200)
HDL: 68 mg/dL (ref >=50)
LDL Cholesterol (Calc): 30 mg/dL (calc)
Non-HDL Cholesterol (Calc): 47 mg/dL (calc) (ref <130)
Total CHOL/HDL Ratio: 1.7 (calc) (ref <5.0)
Triglycerides: 90 mg/dL (ref <150)

## 2020-05-13 LAB — HEPATITIS C ANTIBODY
Hepatitis C Ab: NONREACTIVE
SIGNAL TO CUT-OFF: 0 (ref <1.00)

## 2020-05-13 LAB — TSH: TSH: 1.82 mIU/L (ref 0.40–4.50)

## 2020-05-14 ENCOUNTER — Other Ambulatory Visit: Payer: Self-pay | Admitting: Family Medicine

## 2020-05-14 MED ORDER — POTASSIUM CHLORIDE CRYS ER 20 MEQ PO TBCR: 20.0000 meq | EXTENDED_RELEASE_TABLET | ORAL | 3 refills | Status: DC

## 2020-05-19 ENCOUNTER — Ambulatory Visit: Payer: Medicare PPO

## 2020-05-24 ENCOUNTER — Other Ambulatory Visit: Payer: Self-pay | Admitting: Family Medicine

## 2020-05-26 ENCOUNTER — Other Ambulatory Visit: Payer: Self-pay

## 2020-05-26 ENCOUNTER — Ambulatory Visit (INDEPENDENT_AMBULATORY_CARE_PROVIDER_SITE_OTHER): Payer: Medicare PPO

## 2020-05-26 DIAGNOSIS — Z0000 Encounter for general adult medical examination without abnormal findings: Secondary | ICD-10-CM | POA: Diagnosis not present

## 2020-05-26 NOTE — Patient Instructions (Addendum)
[name] , Thank you for taking time to come for your Medicare Wellness Visit. I appreciate your ongoing commitment to your health goals. Please review the following plan we discussed and let me know if I can assist you in the future.   Screening recommendations/referrals: Colonoscopy: Done 09/22/18 Mammogram: Done 04/28/20 Bone Density: Done 10/29/16 Recommended yearly ophthalmology/optometry visit for glaucoma screening and checkup Recommended yearly dental visit for hygiene and checkup  Vaccinations: Influenza vaccine: Up to date Pneumococcal vaccine: Up to date Tdap vaccine: Due and discussed Shingles vaccine: Completed 2/5 & 03/17/19   Covid-19:Completed 1/25 & 10/26/19  Advanced directives: Please bring a copy of your health care power of attorney and living will to the office at your convenience.  Conditions/risks identified: Stay healthy and active  Next appointment: Follow up in one year for your annual wellness visit    Preventive Care 65 Years and Older, Female Preventive care refers to lifestyle choices and visits with your health care provider that can promote health and wellness. What does preventive care include?  A yearly physical exam. This is also called an annual well check.  Dental exams once or twice a year.  Routine eye exams. Ask your health care provider how often you should have your eyes checked.  Personal lifestyle choices, including:  Daily care of your teeth and gums.  Regular physical activity.  Eating a healthy diet.  Avoiding tobacco and drug use.  Limiting alcohol use.  Practicing safe sex.  Taking low-dose aspirin every day.  Taking vitamin and mineral supplements as recommended by your health care provider. What happens during an annual well check? The services and screenings done by your health care provider during your annual well check will depend on your age, overall health, lifestyle risk factors, and family history of  disease. Counseling  Your health care provider may ask you questions about your:  Alcohol use.  Tobacco use.  Drug use.  Emotional well-being.  Home and relationship well-being.  Sexual activity.  Eating habits.  History of falls.  Memory and ability to understand (cognition).  Work and work Statistician.  Reproductive health. Screening  You may have the following tests or measurements:  Height, weight, and BMI.  Blood pressure.  Lipid and cholesterol levels. These may be checked every 5 years, or more frequently if you are over 65 years old.  Skin check.  Lung cancer screening. You may have this screening every year starting at age 61 if you have a 30-pack-year history of smoking and currently smoke or have quit within the past 15 years.  Fecal occult blood test (FOBT) of the stool. You may have this test every year starting at age 42.  Flexible sigmoidoscopy or colonoscopy. You may have a sigmoidoscopy every 5 years or a colonoscopy every 10 years starting at age 84.  Hepatitis C blood test.  Hepatitis B blood test.  Sexually transmitted disease (STD) testing.  Diabetes screening. This is done by checking your blood sugar (glucose) after you have not eaten for a while (fasting). You may have this done every 1-3 years.  Bone density scan. This is done to screen for osteoporosis. You may have this done starting at age 64.  Mammogram. This may be done every 1-2 years. Talk to your health care provider about how often you should have regular mammograms. Talk with your health care provider about your test results, treatment options, and if necessary, the need for more tests. Vaccines  Your health care provider may  recommend certain vaccines, such as:  Influenza vaccine. This is recommended every year.  Tetanus, diphtheria, and acellular pertussis (Tdap, Td) vaccine. You may need a Td booster every 10 years.  Zoster vaccine. You may need this after age  6.  Pneumococcal 13-valent conjugate (PCV13) vaccine. One dose is recommended after age 52.  Pneumococcal polysaccharide (PPSV23) vaccine. One dose is recommended after age 79. Talk to your health care provider about which screenings and vaccines you need and how often you need them. This information is not intended to replace advice given to you by your health care provider. Make sure you discuss any questions you have with your health care provider. Document Released: 09/23/2015 Document Revised: 05/16/2016 Document Reviewed: 06/28/2015 Elsevier Interactive Patient Education  2017 [place] Prevention in the Home Falls can cause injuries. They can happen to people of all ages. There are many things you can do to make your home safe and to help prevent falls. What can I do on the outside of my home?  Regularly fix the edges of walkways and driveways and fix any cracks.  Remove anything that might make you trip as you walk through a door, such as a raised step or threshold.  Trim any bushes or trees on the path to your home.  Use bright outdoor lighting.  Clear any walking paths of anything that might make someone trip, such as rocks or tools.  Regularly check to see if handrails are loose or broken. Make sure that both sides of any steps have handrails.  Any raised decks and porches should have guardrails on the edges.  Have any leaves, snow, or ice cleared regularly.  Use sand or salt on walking paths during winter.  Clean up any spills in your garage right away. This includes oil or grease spills. What can I do in the bathroom?  Use night lights.  Install grab bars by the toilet and in the tub and shower. Do not use towel bars as grab bars.  Use non-skid mats or decals in the tub or shower.  If you need to sit down in the shower, use a plastic, non-slip stool.  Keep the floor dry. Clean up any water that spills on the floor as soon as it happens.  Remove  soap buildup in the tub or shower regularly.  Attach bath mats securely with double-sided non-slip rug tape.  Do not have throw rugs and other things on the floor that can make you trip. What can I do in the bedroom?  Use night lights.  Make sure that you have a light by your bed that is easy to reach.  Do not use any sheets or blankets that are too big for your bed. They should not hang down onto the floor.  Have a firm chair that has side arms. You can use this for support while you get dressed.  Do not have throw rugs and other things on the floor that can make you trip. What can I do in the kitchen?  Clean up any spills right away.  Avoid walking on wet floors.  Keep items that you use a lot in easy-to-reach places.  If you need to reach something above you, use a strong step stool that has a grab bar.  Keep electrical cords out of the way.  Do not use floor polish or wax that makes floors slippery. If you must use wax, use non-skid floor wax.  Do not have throw rugs and  other things on the floor that can make you trip. What can I do with my stairs?  Do not leave any items on the stairs.  Make sure that there are handrails on both sides of the stairs and use them. Fix handrails that are broken or loose. Make sure that handrails are as long as the stairways.  Check any carpeting to make sure that it is firmly attached to the stairs. Fix any carpet that is loose or worn.  Avoid having throw rugs at the top or bottom of the stairs. If you do have throw rugs, attach them to the floor with carpet tape.  Make sure that you have a light switch at the top of the stairs and the bottom of the stairs. If you do not have them, ask someone to add them for you. What else can I do to help prevent falls?  Wear shoes that:  Do not have high heels.  Have rubber bottoms.  Are comfortable and fit you well.  Are closed at the toe. Do not wear sandals.  If you use a  stepladder:  Make sure that it is fully opened. Do not climb a closed stepladder.  Make sure that both sides of the stepladder are locked into place.  Ask someone to hold it for you, if possible.  Clearly mark and make sure that you can see:  Any grab bars or handrails.  First and last steps.  Where the edge of each step is.  Use tools that help you move around (mobility aids) if they are needed. These include:  Canes.  Walkers.  Scooters.  Crutches.  Turn on the lights when you go into a dark area. Replace any light bulbs as soon as they burn out.  Set up your furniture so you have a clear path. Avoid moving your furniture around.  If any of your floors are uneven, fix them.  If there are any pets around you, be aware of where they are.  Review your medicines with your doctor. Some medicines can make you feel dizzy. This can increase your chance of falling. Ask your doctor what other things that you can do to help prevent falls. This information is not intended to replace advice given to you by your health care provider. Make sure you discuss any questions you have with your health care provider. Document Released: 06/23/2009 Document Revised: 02/02/2016 Document Reviewed: 10/01/2014 Elsevier Interactive Patient Education  2017 [employer].

## 2020-05-26 NOTE — Progress Notes (Signed)
Virtual Visit via Telephone Note  I connected with  [name] on 05/26/20 at  8:00 AM EDT by telephone and verified that I am speaking with the correct person using two identifiers.  Medicare Annual Wellness visit completed telephonically due to Covid-19 pandemic.   Persons participating in this call: This Health Coach and this patient.   Location: Patient: Home Provider: Office   I discussed the limitations, risks, security and privacy concerns of performing an evaluation and management service by telephone and the availability of in person appointments. The patient expressed understanding and agreed to proceed.  Unable to perform video visit due to video visit attempted and failed and/or patient does not have video capability.   Some vital signs may be absent or patient reported.   Willette Brace, LPN    Subjective:   [name] is a 79 y.o. female who presents for Medicare Annual (Subsequent) preventive examination.  Review of Systems     Cardiac Risk Factors include: advanced age (>37men, >26 women);dyslipidemia;hypertension     Objective:    There were no vitals filed for this visit. There is no height or weight on file to calculate BMI.  Advanced Directives 05/26/2020 05/31/2019 05/31/2019 04/29/2019 12/29/2018 12/29/2018 03/06/2018  Does Patient Have a Medical Advance Directive? Yes Yes No Yes Yes Yes Yes  Type of Advance Directive - [place];Living will - [place];Living will Living will [place];Living will -  Does patient want to make changes to medical advance directive? - No - Patient declined - No - Patient declined No - Patient declined - -  Copy of [place] in Chart? - No - copy requested - No - copy requested - - -  Would patient like information on creating a medical advance directive? - No - Patient declined No - Patient declined - - - -    Current Medications  (verified) Outpatient Encounter Medications as of 05/26/2020  Medication Sig  . amLODipine (NORVASC) 2.5 MG tablet TAKE 1 TABLET BY MOUTH DAILY.  Marland Kitchen aspirin EC 81 MG tablet Take 81 mg by mouth daily.  . Bimatoprost (LUMIGAN OP) Place 1 drop into both eyes at bedtime.   . chlorthalidone (HYGROTON) 25 MG tablet TAKE 1 TABLET DAILY.  Marland Kitchen Cholecalciferol (VITAMIN D-3) 5000 UNITS TABS Take 5,000 mg by mouth daily.  . dorzolamide-timolol (COSOPT) 22.3-6.8 MG/ML ophthalmic solution Place 1 drop into both eyes 2 (two) times daily.   . Evolocumab (REPATHA SURECLICK) 409 MG/ML SOAJ Inject 1 pen into the skin every 14 (fourteen) days.  Marland Kitchen ezetimibe (ZETIA) 10 MG tablet TAKE 1 TABLET EACH DAY.  Marland Kitchen Icosapent Ethyl (VASCEPA) 1 g CAPS Take 2 capsules (2 g total) by mouth 2 (two) times daily.  Marland Kitchen levothyroxine (SYNTHROID) 50 MCG tablet TAKE 1 TABLET BEFORE BREAKFAST.  . metoprolol tartrate (LOPRESSOR) 25 MG tablet TAKE 1 TABLET BY MOUTH TWICE DAILY.  . nitroGLYCERIN (NITROSTAT) 0.4 MG SL tablet Place 1 tablet (0.4 mg total) under the tongue every 5 (five) minutes x 3 doses as needed for chest pain.  . pilocarpine (PILOCAR) 1 % ophthalmic solution Place 1 drop into both eyes 2 (two) times daily.   [name] Glycol-Propyl Glycol (SYSTANE OP) Place 1-2 drops into both eyes as needed (for dry eyes).   . potassium chloride SA (KLOR-CON) 20 MEQ tablet Take 1 tablet (20 mEq total) by mouth once a week.  . Probiotic Product (PROBIOTIC DAILY) CAPS Take 1  capsule by mouth daily. Reported on 10/10/2015  . ticagrelor (BRILINTA) 90 MG TABS tablet Take 1 tablet (90 mg total) by mouth 2 (two) times daily.  . AMBULATORY NON FORMULARY MEDICATION Take 180 mg by mouth daily. Medication Name: 180 mg bempedoic acid vs a placebo, CLEAR Research Study drug provided (Patient not taking: Reported on 05/26/2020)  . famotidine (PEPCID) 20 MG tablet Take 1 tablet (20 mg total) by mouth 2 (two) times daily. (Patient not taking: Reported on  05/26/2020)   No facility-administered encounter medications on file as of 05/26/2020.    Allergies (verified) Adhesive [tape] and Rosuvastatin   History: Past Medical History:  Diagnosis Date  . Abnormal thyroid function test 07/01/2013  . Anxiety   . Arthritis   . Cancer ([place])   . Coronary artery disease   . Dermatitis 11/25/2013  . Glaucoma 11/02/2012  . HTN (hypertension) 11/02/2012  . 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 Hyperlipidemia   . Hypothyroid   . Obesity    Past Surgical History:  Procedure Laterality Date  . AUGMENTATION MAMMAPLASTY Left    05/2006  . BREAST SURGERY  10/2005   Left Mastectomy, reconstruction, reduction on right. Dr. Harlow Mares  . CARDIAC CATHETERIZATION    . COLONOSCOPY    . CORONARY STENT INTERVENTION N/A 06/01/2019   Procedure: CORONARY STENT INTERVENTION;  Surgeon: Sherren Mocha, MD;  Location: [facility];  Service: Cardiovascular;  Laterality: N/A;  . CRYOTHERAPY     GYN for CIN III  . DILATION AND CURETTAGE OF UTERUS    . EXCISION MORTON'S NEUROMA     right foot  . EYE SURGERY Right 05/08/2018  . LEFT HEART CATH AND CORONARY ANGIOGRAPHY N/A 06/01/2019   Procedure: LEFT HEART CATH AND CORONARY ANGIOGRAPHY;  Surgeon: Sherren Mocha, MD;  Location: [place];  Service: Cardiovascular;  Laterality: N/A;  . MASTECTOMY    . REDUCTION MAMMAPLASTY Right    Family History  Problem Relation Age of Onset  . Pneumonia Mother   . Hyperlipidemia Mother   . Parkinsonism Mother   . CAD Mother   . Other Father        bilateral subdural hematoma  . Colon cancer Paternal Grandmother   . Hyperlipidemia Sister   . Prostate cancer Neg Hx   . Breast cancer Neg Hx   . Heart disease Neg Hx   . Diabetes Neg Hx    Social History   Socioeconomic History  . Marital status: Widowed    Spouse name: Not on file  . Number of children: 1  . Years of education: Not on file  . Highest education  level: Not on file  Occupational History  . Occupation: retired  Tobacco Use  . Smoking status: Never Smoker  . Smokeless tobacco: Never Used  Vaping Use  . Vaping Use: Never used  Substance and Sexual Activity  . Alcohol use: Yes    Comment: Rare  . Drug use: No  . Sexual activity: Not on file  Other Topics Concern  . Not on file  Social History Narrative   Married 1981. 1 daughter, 2 step sons. 4 grandchildren      Oncologist      Hobbies: going out to eat, activities with church including cancer support group at church   Social Determinants of Health   Financial Resource Strain: Low Risk   . Difficulty of Paying Living Expenses: Not hard at all  Food Insecurity: No Food Insecurity  . Worried About Charity fundraiser in the Last Year: Never true  . Ran Out of Food in the Last Year: Never true  Transportation Needs: No Transportation Needs  . Lack of Transportation (Medical): No  . Lack of Transportation (Non-Medical): No  Physical Activity: Insufficiently Active  . Days of Exercise per Week: 5 days  . Minutes of Exercise per Session: 20 min  Stress: Stress Concern Present  . Feeling of Stress : To some extent  Social Connections: Moderately Integrated  . Frequency of Communication with Friends and Family: More than three times a week  . Frequency of Social Gatherings with Friends and Family: Not on file  . Attends Religious Services: More than 4 times per year  . Active Member of Clubs or Organizations: Yes  . Attends Archivist Meetings: Never  . Marital Status: Widowed    Tobacco Counseling Counseling given: Not Answered   Clinical Intake:  Pre-visit preparation completed: Yes  Pain : No/denies pain     BMI - recorded: 26.58 Nutritional Status: BMI 25 -29 Overweight Nutritional Risks: None Diabetes: No  How often do you need to have someone help you when you read instructions, pamphlets, or other written materials from your  doctor or pharmacy?: 1 - Never  Diabetic?No  Activities of Daily Living In your present state of health, do you have any difficulty performing the following activities: 05/26/2020 05/31/2019  Hearing? Y N  Comment will get hearing checked -  Vision? N N  Difficulty concentrating or making decisions? N N  Walking or climbing stairs? N N  Dressing or bathing? N N  Doing errands, shopping? N N  Preparing Food and eating ? N -  Using the Toilet? N -  In the past six months, have you accidently leaked urine? N -  Do you have problems with loss of bowel control? N -  Managing your Medications? N -  Managing your Finances? N -  Housekeeping or managing your Housekeeping? N -  Some recent data might be hidden    Patient Care Team: Marin Olp, MD as PCP - General (Family Medicine) Fay Records, MD as PCP - Cardiology (Cardiology) Magrinat, Virgie Dad, MD (Hematology and Oncology) Neldon Mc, MD (General Surgery) Crissie Reese, MD (Plastic Surgery) Clent Jacks, MD as Consulting Physician (Ophthalmology)  Indicate any recent Medical Services you may have received from other than [facility] providers in the past year (date may be approximate).     Assessment:   This is a routine wellness examination for Janecia.  Hearing/Vision screen  Hearing Screening   125Hz  250Hz  500Hz  1000Hz  2000Hz  3000Hz  4000Hz  6000Hz  8000Hz   Right ear:           Left ear:           Comments: Pt states she needs to get hearing checked   Vision Screening Comments: Pt follows up with [facility] in [place]  Dietary issues and exercise activities discussed: Current Exercise Habits: Home exercise routine, Time (Minutes): 25, Frequency (Times/Week): 5, Weekly Exercise (Minutes/Week): 125  Goals    . Patient Stated     Continue to stay active in lunch Lunch bunch     . Patient Stated     Stay healthy and active    . Weight (lb) < 200 lb (90.7 kg)     Will go back to weight watchers Will start  exercising with spouse for weight bearing Will buddy up  with your friend       Depression Screen PHQ 2/9 Scores 05/26/2020 05/12/2020 10/15/2019 09/17/2019 09/02/2019 07/27/2019 04/29/2019  PHQ - 2 Score 1 0 0 0 0 0 0  PHQ- 9 Score - 0 1 - 1 2 -    Fall Risk Fall Risk  05/26/2020 05/12/2020 07/27/2019 04/29/2019 03/06/2018  Falls in the past year? 1 0 0 0 No  Number falls in past yr: 1 0 0 0 -  Injury with Fall? 0 0 0 0 -  Risk for fall due to : Impaired vision;Impaired balance/gait - - - -  Follow up Falls prevention discussed - - - -    Any stairs in or around the home? Yes  If so, are there any without handrails? No  Home free of loose throw rugs in walkways, pet beds, electrical cords, etc? Yes  Adequate lighting in your home to reduce risk of falls? Yes   ASSISTIVE DEVICES UTILIZED TO PREVENT FALLS:  Life alert? No  Use of a cane, walker or w/c? No  Grab bars in the bathroom? Yes  Shower chair or bench in shower? Yes  Elevated toilet seat or a handicapped toilet? Yes   TIMED UP AND GO:  Was the test performed? No .    Cognitive Function: MMSE - Mini Mental State Exam 03/06/2018 11/23/2016  Not completed: (No Data) (No Data)     6CIT Screen 05/26/2020  What Year? 0 points  What month? 0 points  Count back from 20 0 points  Months in reverse 0 points  Repeat phrase 0 points    Immunizations Immunization History  Administered Date(s) Administered  . Fluad Quad(high Dose 65+) 04/29/2019, 05/12/2020  . Influenza Split 07/24/2011  . Influenza Whole 06/15/2009  . Influenza, High Dose Seasonal PF 06/29/2013, 07/05/2015, 05/10/2016, 05/21/2017, 06/18/2018  . Influenza,inj,Quad PF,6+ Mos 06/21/2014, 06/14/2016  . PFIZER SARS-COV-2 Vaccination 10/05/2019, 10/26/2019  . Pneumococcal Conjugate-13 04/15/2014  . Pneumococcal Polysaccharide-23 07/17/2000, 10/02/2016  . Tdap 07/17/2010  . Zoster Recombinat (Shingrix) 10/15/2018, 03/17/2019    TDAP status: Up to date Flu Vaccine  status: Up to date Pneumococcal vaccine status: Up to date Covid-19 vaccine status: Completed vaccines  Qualifies for Shingles Vaccine? Yes   Zostavax completed Yes   Shingrix Completed?: Yes  Screening Tests Health Maintenance  Topic Date Due  . TETANUS/TDAP  07/17/2020  . MAMMOGRAM  04/28/2021  . COLONOSCOPY  09/23/2023  . INFLUENZA VACCINE  Completed  . DEXA SCAN  Completed  . COVID-19 Vaccine  Completed  . Hepatitis C Screening  Completed  . PNA vac Low Risk Adult  Completed    Health Maintenance  There are no preventive care reminders to display for this patient.  Colorectal cancer screening: Completed 09/22/18. Repeat every 5 years Mammogram status: Completed 04/28/20. Repeat every year Bone Density status: Completed 10/29/16. Results reflect: Bone density results: OSTEOPOROSIS. Repeat every 2 years.    Additional Screening:  Hepatitis C Screening: Completed 05/12/20  Vision Screening: Recommended annual ophthalmology exams for early detection of glaucoma and other disorders of the eye. Is the patient up to date with their annual eye exam?  Yes  Who is the provider or what is the name of the office in which the patient attends annual eye exams? [facility] [place]   Dental Screening: Recommended annual dental exams for proper oral hygiene  Community Resource Referral / Chronic Care Management: CRR required this visit?  No   CCM required this visit?  No  Plan:     I have personally reviewed and noted the following in the patient's chart:   . Medical and social history . Use of alcohol, tobacco or illicit drugs  . Current medications and supplements . Functional ability and status . Nutritional status . Physical activity . Advanced directives . List of other physicians . Hospitalizations, surgeries, and ER visits in previous 12 months . Vitals . Screenings to include cognitive, depression, and falls . Referrals and appointments  In addition, I  have reviewed and discussed with patient certain preventive protocols, quality metrics, and best practice recommendations. A written personalized care plan for preventive services as well as general preventive health recommendations were provided to patient.     Willette Brace, LPN   [id number]/68/[id number]   Nurse Notes: None

## 2020-05-27 ENCOUNTER — Telehealth: Payer: Self-pay | Admitting: Family Medicine

## 2020-05-27 NOTE — Telephone Encounter (Signed)
Noted! Thank you

## 2020-05-27 NOTE — Telephone Encounter (Signed)
Patient is calling in stating that she had a medicare wellness visit with Otila Kluver and she went over medications, and was having a hard time understanding he. Noticed in the notes that Otila Kluver put that Camdynn was not taking famotidine (PEPCID) 20 MG tablet but Maghan wanted to let Dr.Hunter know that she is taking it, there was just some miss communication with Otila Kluver.

## 2020-05-30 ENCOUNTER — Other Ambulatory Visit: Payer: Self-pay | Admitting: Family Medicine

## 2020-06-13 ENCOUNTER — Other Ambulatory Visit: Payer: Self-pay

## 2020-06-13 ENCOUNTER — Encounter: Payer: Medicare PPO | Admitting: *Deleted

## 2020-06-13 VITALS — BP 108/45 | HR 66 | Wt 149.0 lb

## 2020-06-13 DIAGNOSIS — Z006 Encounter for examination for normal comparison and control in clinical research program: Secondary | ICD-10-CM

## 2020-06-13 NOTE — Research (Signed)
Subject came to clinic today for Visit T16, M42 for the CLEAR research study.    Subject was re-consented to [place] Version  8.1 [id number] Local [id number] on October 4th, 2021 at 0902.   Subject Name: [name]  Subject met inclusion and exclusion criteria.  The informed consent form, study requirements and expectations were reviewed with the subject and questions and concerns were addressed prior to the signing of the consent form.  The subject verbalized understanding of the trial requirements.  The subject agreed to participate in the CLEAR trial and signed the informed consent at [place] on 06/13/20  The informed consent was obtained prior to performance of any protocol-specific procedures for the subject.  A copy of the signed informed consent was given to the subject and a copy was placed in the subject's medical record.   Preston Fleeting C    Subject hasn't had any SAE/AE to report to sponsor at this time. All concomitant medications have been reviewed and updated. Subject was 100% compliant with IP. New IP dispensed to subject and next appointment scheduled for March 28th, 2022 at 9:00 am.

## 2020-06-16 ENCOUNTER — Other Ambulatory Visit: Payer: Self-pay | Admitting: Family Medicine

## 2020-06-26 ENCOUNTER — Other Ambulatory Visit: Payer: Self-pay | Admitting: Internal Medicine

## 2020-06-28 MED ORDER — ICOSAPENT ETHYL 1 G PO CAPS: 2.0000 g | ORAL_CAPSULE | Freq: Two times a day (BID) | ORAL | 11 refills | Status: DC

## 2020-06-28 MED ORDER — TICAGRELOR 90 MG PO TABS
90.0000 mg | ORAL_TABLET | Freq: Two times a day (BID) | ORAL | 11 refills | Status: DC
Start: 2020-06-28 — End: 2021-06-21

## 2020-07-23 ENCOUNTER — Other Ambulatory Visit: Payer: Self-pay | Admitting: Family Medicine

## 2020-07-28 ENCOUNTER — Encounter: Payer: Self-pay | Admitting: Family Medicine

## 2020-07-28 ENCOUNTER — Ambulatory Visit: Payer: Medicare PPO | Admitting: Family Medicine

## 2020-07-28 ENCOUNTER — Other Ambulatory Visit: Payer: Self-pay

## 2020-07-28 ENCOUNTER — Telehealth: Payer: Self-pay

## 2020-07-28 VITALS — BP 120/70 | HR 78 | Temp 98.3°F | Resp 18 | Ht 63.0 in | Wt 148.4 lb

## 2020-07-28 DIAGNOSIS — M79605 Pain in left leg: Secondary | ICD-10-CM | POA: Diagnosis not present

## 2020-07-28 NOTE — Telephone Encounter (Signed)
error 

## 2020-07-28 NOTE — Telephone Encounter (Signed)
Patient is coming in at 106.

## 2020-07-28 NOTE — Telephone Encounter (Signed)
Patient explained to me that the incident happened two weeks ago, has seen a specialist about the issue but noticed a little swelling yesterday morning. [name] did say that the swelling has gone down, but wanted to check in with [place].

## 2020-07-28 NOTE — Progress Notes (Signed)
Phone [phone] In person visit   Subjective:   [name] is a 79 y.o. year old very pleasant female patient who presents for/with See problem oriented charting Chief Complaint  Patient presents with  . Possible blood clot   This visit occurred during the SARS-CoV-2 public health emergency.  Safety protocols were in place, including screening questions prior to the visit, additional usage of staff PPE, and extensive cleaning of exam room while observing appropriate contact time as indicated for disinfecting solutions.   Past Medical History-  Patient Active Problem List   Diagnosis Date Noted  . CAD (coronary artery disease) s/p stent mCx 05/2019 06/02/2019    Priority: High  . Grieving 02/18/2017    Priority: High  . Osteoporosis 11/05/2016    Priority: High  . Chest pain 12/29/2018    Priority: Medium  . Primary open-angle glaucoma 11/20/2017    Priority: Medium  . Hypothyroidism 07/01/2013    Priority: Medium  . Hypertension 11/02/2012    Priority: Medium  . Hyperlipidemia 07/24/2011    Priority: Medium  . 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 History of adenomatous polyp of colon 09/30/2018    Priority: Low  . Medicare annual wellness visit, subsequent 09/26/2014    Priority: Low  . Vitamin D deficiency 11/02/2012    Priority: Low  . Obesity (BMI 30-39.9) 07/24/2011    Priority: Low  . Statin myopathy 07/01/2019  . Hypokalemia 06/02/2019  . NSTEMI (non-ST elevated myocardial infarction) ([place]) 05/31/2019  . Pseudophakia of both eyes 11/20/2017    Medications- reviewed and updated Current Outpatient Medications  Medication Sig Dispense Refill  . amLODipine (NORVASC) 2.5 MG tablet TAKE 1 TABLET BY MOUTH DAILY. 90 tablet 3  . aspirin EC 81 MG tablet Take 81 mg by mouth daily.    . Bimatoprost (LUMIGAN OP) Place 1 drop into both eyes at bedtime.     . chlorthalidone (HYGROTON) 25 MG tablet TAKE  1 TABLET ONCE DAILY. 90 tablet 0  . Cholecalciferol (VITAMIN D-3) 5000 UNITS TABS Take 5,000 mg by mouth daily.    . dorzolamide-timolol (COSOPT) 22.3-6.8 MG/ML ophthalmic solution Place 1 drop into both eyes 2 (two) times daily.     . Evolocumab (REPATHA SURECLICK) 465 MG/ML SOAJ Inject 1 pen into the skin every 14 (fourteen) days. 2 pen 11  . ezetimibe (ZETIA) 10 MG tablet TAKE 1 TABLET EACH DAY. 90 tablet 1  . famotidine (PEPCID) 20 MG tablet Take 1 tablet (20 mg total) by mouth 2 (two) times daily. 180 tablet 3  . icosapent Ethyl (VASCEPA) 1 g capsule Take 2 capsules (2 g total) by mouth 2 (two) times daily. 120 capsule 11  . levothyroxine (SYNTHROID) 50 MCG tablet TAKE 1 TABLET BEFORE BREAKFAST. 90 tablet 0  . metoprolol tartrate (LOPRESSOR) 25 MG tablet TAKE 1 TABLET BY MOUTH TWICE DAILY. 180 tablet 0  . nitroGLYCERIN (NITROSTAT) 0.4 MG SL tablet Place 1 tablet (0.4 mg total) under the tongue every 5 (five) minutes x 3 doses as needed for chest pain. 25 tablet 12  . pilocarpine (PILOCAR) 1 % ophthalmic solution Place 1 drop into both eyes 2 (two) times daily.     Vladimir Faster Glycol-Propyl Glycol (SYSTANE OP) Place 1-2 drops into both eyes as needed (for dry eyes).     . potassium chloride SA (KLOR-CON) 20 MEQ tablet Take 1 tablet (20 mEq total) by mouth once a week.  13 tablet 3  . Probiotic Product (PROBIOTIC DAILY) CAPS Take 1 capsule by mouth daily. Reported on 10/10/2015    . ticagrelor (BRILINTA) 90 MG TABS tablet Take 1 tablet (90 mg total) by mouth 2 (two) times daily. 60 tablet 11  . AMBULATORY NON FORMULARY MEDICATION Take 180 mg by mouth daily. Medication Name: 180 mg bempedoic acid vs a placebo, CLEAR Research Study drug provided (Patient not taking: Reported on 05/26/2020)     No current facility-administered medications for this visit.     Objective:  BP 120/70   Pulse 78   Temp 98.3 F (36.8 C) (Temporal)   Resp 18   Ht 5\' 3"  (1.6 m)   Wt 148 lb 6.4 oz (67.3 kg)   SpO2  98%   BMI 26.29 kg/m  Gen: NAD, resting comfortably  CV: RRR  Lungs: nonlabored, normal respiratory rate Abdomen: soft/nondistended  Trace edema in left lower leg, none on right lower leg.  Calf size equal 10 cm below tibial plateau and no calf pain.  Left lower leg an area where clothing rack hit patient with some localized swelling which is minimally tender to touch     Assessment and Plan   #Left lower leg redness/swelling S: has a high clothing rack that she hangs her clothes on to dry- went to pull clothes and came crashing down on left lower leg about 7-10 days ago. Hurt really bad at first and even saw stars from the pain. Had 2 family members lose their legs(Dad in retirement home cutting own toenail and cut his foot and got gangrene , doesn't remember other story). Not a lot of pain but has some swelling. Does have known arthritis in left foot - has seen DR. Georgina Snell back in June.   Patient's main concern is making sure she does not have a blood clot.  She states her leg is already looking much better.  A/P: 79 year old female with left lower leg swelling/redness after clothing rack fell on the 7 to 10 days ago which appears to be improving.  No calf pain or tenderness.  Highly doubt DVT.  We discussed this may take several weeks to heal.  Conservative measures discussed -Patient needed a lot of reassurance-she continues to miss her husband who is a physician who provided reassurance in situations like this in the past. -Minimal pain/discomfort today-doubt fracture and do not think we need further imaging  Recommended follow up: Keep January visit or sooner if needed Future Appointments  Date Time Provider [place]  09/15/2020  8:40 AM Marin Olp, MD [facility]  10/27/2020  8:00 AM Fay Records, MD [facility]  12/05/2020  9:00 AM [place] [facility] None  06/01/2021  8:30 AM [facility] HEALTH COACH [facility]    Lab/Order  associations: No diagnosis found.  No orders of the defined types were placed in this encounter.   Time Spent: 20 minutes of total time (4:45 PM-5:05 PM) was spent on the date of the encounter performing the following actions: chart review prior to seeing the patient, obtaining history, performing a medically necessary exam, counseling on the treatment plan, placing orders, and documenting in our EHR.   Return precautions advised.  Garret Reddish, MD

## 2020-07-28 NOTE — Telephone Encounter (Signed)
Can work her in at [address] her since we are working her in -we may be running behind but still would like for her to be here by appointment time

## 2020-07-28 NOTE — Telephone Encounter (Signed)
Patient has an appointment tomorrow with Aldona Bar.   Nurse Assessment Nurse: Harvie Bridge, RN, Beth Date/Time (Eastern Time): 07/28/2020 8:26:39 AM Confirm and document reason for call. If symptomatic, describe symptoms. ---Caller states her washing rack closed on her left leg about 7 days ago and today she woke up, and thinks she might have a blood clot on her leg but today it's turned a little yellow and red. Up above ankle on the side. Caller states she also is currently on blood thinners and has previously had a heart attack and is concerned. Pain level: 1/10 Does the patient have any new or worsening symptoms? ---Yes Will a triage be completed? ---Yes Related visit to physician within the last 2 weeks? ---No Does the PT have any chronic conditions? (i.e. diabetes, asthma, this includes High risk factors for pregnancy, etc.) ---Yes List chronic conditions. ---Heart ATtack, blood thinner. Is this a behavioral health or substance abuse call? ---No Guidelines Guideline Title Affirmed Question Affirmed Notes Nurse Date/Time Eilene Ghazi Time) Leg Swelling and Edema Looks like a boil, infected sore, deep ulcer or other infected rash (spreading redness, pus) Newhart, RN, Beth 07/28/2020 8:30:40 AM PLEASE NOTE: All timestamps contained within this report are represented as [place] Standard Time. CONFIDENTIALTY NOTICE: This fax transmission is intended only for the addressee. It contains information that is legally privileged, confidential or otherwise protected from use or disclosure. If you are not the intended recipient, you are strictly prohibited from reviewing, disclosing, copying using or disseminating any of this information or taking any action in reliance on or regarding this information. If you have received this fax in error, please notify us immediately by telephone so that we can arrange for its return to [place]. Phone: [phone], Toll-Free: [phone], Fax: [phone] Page: 2 of  2 Call Id: [id number] [place]. Time Eilene Ghazi Time) Disposition Final User 07/28/2020 8:33:25 AM See PCP within 24 Hours Yes Newhart, RN, Beth Caller Disagree/Comply Comply Caller Understands Yes PreDisposition Did not know what to do Care Advice Given Per Guideline SEE PCP WITHIN 24 HOURS: * IF OFFICE WILL BE OPEN: You need to be examined within the next 24 hours. Call your doctor (or NP/PA) when the office opens and make an appointment. CLEANING AN INFECTED WOUND: ANTIBIOTIC OINTMENT - INFECTED AREA: * Put a small amount of antibiotic ointment on the infected area 3 times per day. CALL BACK IF: * Fever occurs * You become worse CARE ADVICE given per Leg Swelling and Edema (Adult) guideline. Referrals Warm transfer to backlin

## 2020-07-28 NOTE — Patient Instructions (Addendum)
Health Maintenance Due  Topic Date Due  . TETANUS/TDAP Cheaper to receive at the pharmacy. 07/17/2020   Call us back with date of your pfizer booster  I think leg just has some soft tissue swelling- I dont see an obvious clot (though let us know if calf swells or has pain or you have shortness of breath). If you have worsening redness/swelling please let me know but I think this weill improve over coming weeks. Can try slight warm compress or even a cool compress.   Recommended follow up: see you in January unless you need [place] sooner.

## 2020-07-28 NOTE — Telephone Encounter (Signed)
Pt needs to be seen today, should not wait till tomorrow. Please schedule with another provider.

## 2020-07-29 ENCOUNTER — Ambulatory Visit: Payer: Medicare PPO | Admitting: Physician Assistant

## 2020-08-18 ENCOUNTER — Telehealth: Payer: Self-pay | Admitting: Internal Medicine

## 2020-08-18 NOTE — Progress Notes (Signed)
Cardiology Office Note   Date:  08/19/2020   ID:  Shareena, [name] [birth year], MRN [id number]  PCP:  Marin Olp, MD  Cardiologist:   Dorris Carnes, MD   F/U of CAD    History of Present Illness: [name] is a 79 y.o. female with a history of HTN, HL and  CAD  Shewas admitted in April 2020 with CP   CT chest/abd showed extensive atherosclerosis.  Cardac CT angiogram showed no obstructive lesons    In September2020 she was admtted with an NSTEMI.  Cardiac catheterization showed severe single-vessel disease with a critical stenosis of the left circumflex.  She underwent PTCA/DES to this area.  There was nonobstructive disease noted in the LAD.  Plan for dual antiplatelet therapy for 12 months.  The patient was enrolled in the CLEAR trial since 2018 her LDL has trended down from 222  She was seen  by [facility] and Praluent was added along with Vascepa.  She discontinued DHEA and krill oil.   I saw the pt in clinic last summer   The pt called EMS the other night with chest pressure  She woke her up  She got scared When EMS arrived her  BP was 174/78  Sat 99%  EKG OK  Stayed at home  ? Reflux Brings in log of BP from home  110s to 140s, mainly 120s/130s    She feels better No CP  Wonders if she needs to be on so many cholesterol meds     Current Meds  Medication Sig  . AMBULATORY NON FORMULARY MEDICATION Take 180 mg by mouth daily. Medication Name: 180 mg bempedoic acid vs a placebo, CLEAR Research Study drug provided  . amLODipine (NORVASC) 2.5 MG tablet TAKE 1 TABLET BY MOUTH DAILY.  Marland Kitchen aspirin EC 81 MG tablet Take 81 mg by mouth daily.  . Bimatoprost (LUMIGAN OP) Place 1 drop into both eyes at bedtime.   . chlorthalidone (HYGROTON) 25 MG tablet TAKE 1 TABLET ONCE DAILY.  Marland Kitchen Cholecalciferol (VITAMIN D-3) 5000 UNITS TABS Take 5,000 mg by mouth daily.  . dorzolamide-timolol (COSOPT) 22.3-6.8 MG/ML ophthalmic solution Place 1 drop into both eyes 2 (two) times daily.    . Evolocumab (REPATHA SURECLICK) 272 MG/ML SOAJ Inject 1 pen into the skin every 14 (fourteen) days.  . famotidine (PEPCID) 20 MG tablet Take 1 tablet (20 mg total) by mouth 2 (two) times daily.  Marland Kitchen levothyroxine (SYNTHROID) 50 MCG tablet TAKE 1 TABLET BEFORE BREAKFAST.  . metoprolol tartrate (LOPRESSOR) 25 MG tablet TAKE 1 TABLET BY MOUTH TWICE DAILY.  . nitroGLYCERIN (NITROSTAT) 0.4 MG SL tablet Place 1 tablet (0.4 mg total) under the tongue every 5 (five) minutes x 3 doses as needed for chest pain.  . pilocarpine (PILOCAR) 1 % ophthalmic solution Place 1 drop into both eyes 2 (two) times daily.   Vladimir Faster Glycol-Propyl Glycol (SYSTANE OP) Place 1-2 drops into both eyes as needed (for dry eyes).   . potassium chloride SA (KLOR-CON) 20 MEQ tablet Take 1 tablet (20 mEq total) by mouth once a week.  . Probiotic Product (PROBIOTIC DAILY) CAPS Take 1 capsule by mouth daily. Reported on 10/10/2015  . ticagrelor (BRILINTA) 90 MG TABS tablet Take 1 tablet (90 mg total) by mouth 2 (two) times daily.  . [DISCONTINUED] ezetimibe (ZETIA) 10 MG tablet TAKE 1 TABLET EACH DAY.  . [DISCONTINUED] icosapent Ethyl (VASCEPA) 1 g capsule Take 2 capsules (2 g total) by mouth  2 (two) times daily.     Allergies:   Adhesive [tape] and Rosuvastatin   Past Medical History:  Diagnosis Date  . Abnormal thyroid function test 07/01/2013  . Anxiety   . Arthritis   . Cancer ([place])   . Coronary artery disease   . Dermatitis 11/25/2013  . Glaucoma 11/02/2012  . HTN (hypertension) 11/02/2012  . 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 Hyperlipidemia   . Hypothyroid   . Obesity     Past Surgical History:  Procedure Laterality Date  . AUGMENTATION MAMMAPLASTY Left    05/2006  . BREAST SURGERY  10/2005   Left Mastectomy, reconstruction, reduction on right. Dr. Harlow Mares  . CARDIAC CATHETERIZATION    . COLONOSCOPY    . CORONARY STENT INTERVENTION N/A 06/01/2019   Procedure:  CORONARY STENT INTERVENTION;  Surgeon: Sherren Mocha, MD;  Location: [facility];  Service: Cardiovascular;  Laterality: N/A;  . CRYOTHERAPY     GYN for CIN III  . DILATION AND CURETTAGE OF UTERUS    . EXCISION MORTON'S NEUROMA     right foot  . EYE SURGERY Right 05/08/2018  . LEFT HEART CATH AND CORONARY ANGIOGRAPHY N/A 06/01/2019   Procedure: LEFT HEART CATH AND CORONARY ANGIOGRAPHY;  Surgeon: Sherren Mocha, MD;  Location: [facility];  Service: Cardiovascular;  Laterality: N/A;  . MASTECTOMY    . REDUCTION MAMMAPLASTY Right      Social History:  The patient  reports that she has never smoked. She has never used smokeless tobacco. She reports current alcohol use. She reports that she does not use drugs.   Family History:  The patient's family history includes CAD in her mother; Colon cancer in her paternal grandmother; Hyperlipidemia in her mother and sister; Other in her father; Parkinsonism in her mother; Pneumonia in her mother.    ROS:  Please see the history of present illness. All other systems are reviewed and  Negative to the above problem except as noted.    PHYSICAL EXAM: VS:  BP (!) 130/52   Pulse 60   Ht 5\' 3"  (1.6 m)   Wt 149 lb 3.2 oz (67.7 kg)   SpO2 96%   BMI 26.43 kg/m   GEN: Pt is a 79 yo female in no acute distress  HEENT: normal  Neck: no JVD, Cardiac: RRR; no murmurs No LE edema  Respiratory:  clear to auscultation bilaterally GI: soft, nontender, nondistended, + BS  MS: no deformity Moving all extremities   Skin: warm and dry, no rash Neuro:  Strength and sensation are intact Psych: euthymic mood, full affect   EKG:  EKG is not ordered today.   Lipid Panel    Component Value Date/Time   CHOL 115 05/12/2020 1136   CHOL 112 09/21/2019 0908   TRIG 90 05/12/2020 1136   HDL 68 05/12/2020 1136   HDL 56 09/21/2019 0908   CHOLHDL 1.7 05/12/2020 1136   VLDL 21.8 09/02/2019 0828   LDLCALC 30 05/12/2020 1136   LDLDIRECT 114.0  04/29/2019 1102      Wt Readings from Last 3 Encounters:  08/19/20 149 lb 3.2 oz (67.7 kg)  07/28/20 148 lb 6.4 oz (67.3 kg)  06/13/20 149 lb (67.6 kg)    Cardiac Studies  CORONARY STENT INTERVENTION 05/2019  LEFT HEART CATH AND CORONARY ANGIOGRAPHY  Conclusion  1) Severe single vessel CAD with critical stenosis of the mid-circumflex, treated successfully with PTCA and stenting (  2.5x12 mm Synergy DES 2) nonobstructive LAD stenosis 3) widely patent RCA, dominant vessel 4) normal LV systolic function  Recommend: DAPT with ASA and ticagrelor x 12 months without interruption. Pt loaded with ticagrelor 180 mg on the cath lab table. DC home tomorrow am if no complications arise.      ASSESSMENT AND PLAN:  1  CAD  No symptoms of angina   2  HTN  BP is  well controlled   3  HL Stop Zetia  Decrease Vescepa   Check labs in Feb   4.  PAD atherosclerosis of aorta noted. Pt without symptoms     F/U in APril in clinic  Labs in Feb 2022   Current medicines are reviewed at length with the patient today.  The patient does not have concerns regarding medicines.  Signed, Dorris Carnes, MD  08/19/2020 2:30 PM    [address], [place], [place]  [address] Phone: [phone]; Fax: [phone]

## 2020-08-18 NOTE — Telephone Encounter (Signed)
Ms. Cantrell reports chest discomfort and belching for several days. She suspected her symptoms were acid reflux and anxiety. The symptoms come and go and she does not associate them with food/drink consumption. She has not taken any NTG. She has been taking her Pepcid as directed.  She cannot describe her chest discomfort, but states it is "through her chest to her back." She states it does not feel like it did when she had her heart attack.  She called 911 last night when her BP was elevated. EMS went to her home and stayed with her a while. She had an EKG she was told looked "good" and her blood pressure came down (she does not remember the value).   This morning, the patient's BP is 170/81 and HR 82. She has not taken her medications and she is very anxious.  She is currently asymptomatic otherwise.  Instructed the patient to take her morning medications and Dr. Harrington Challenger' nurse will contact her in a couple hours to get an updated BP reading and to discuss symptoms further. She requests a call on her home line: [phone]. She was grateful for call and agrees with plan.

## 2020-08-18 NOTE — Telephone Encounter (Signed)
Called patient back.  She is feeling better.  Ate, took a bath.  No symptoms have returned.  Her daughter brought her Tums which she has used today.  BP at 11:0-0 am was 136-69, HR 65.  She would like to be seen asap with Dr. Harrington Challenger.  I have scheduled her tomorrow.  Pt appreciative for assistance.

## 2020-08-18 NOTE — Telephone Encounter (Signed)
Pt c/o of Chest Pain: STAT if CP now or developed within 24 hours  1. Are you having CP right now? No  2. Are you experiencing any other symptoms (ex. SOB, nausea, vomiting, sweating)? sweating  3. How long have you been experiencing CP? Several days  4. Is your CP continuous or coming and going?   5. Have you taken Nitroglycerin? No  BP is 170/81; 82 ?

## 2020-08-19 ENCOUNTER — Ambulatory Visit: Payer: Medicare PPO | Admitting: Internal Medicine

## 2020-08-19 ENCOUNTER — Encounter: Payer: Self-pay | Admitting: Internal Medicine

## 2020-08-19 ENCOUNTER — Other Ambulatory Visit: Payer: Self-pay

## 2020-08-19 VITALS — BP 130/52 | HR 60 | Ht 63.0 in | Wt 149.2 lb

## 2020-08-19 DIAGNOSIS — I2510 Atherosclerotic heart disease of native coronary artery without angina pectoris: Secondary | ICD-10-CM

## 2020-08-19 MED ORDER — ICOSAPENT ETHYL 1 G PO CAPS
1.0000 g | ORAL_CAPSULE | Freq: Two times a day (BID) | ORAL | 11 refills | Status: DC
Start: 2020-08-19 — End: 2021-09-14

## 2020-08-19 NOTE — Patient Instructions (Addendum)
Medication Instructions:  Your physician has recommended you make the following change in your medication:  1.) decrease Vascepa to one tablet twice daily 2.) stop ezetimibe (Zetia)  *If you need a refill on your cardiac medications before your next appointment, please call your pharmacy*   Lab Work: bmet today Lipids in 10-12 weeks  If you have labs (blood work) drawn today and your tests are completely normal, you will receive your results only by: Marland Kitchen MyChart Message (if you have MyChart) OR . A paper copy in the mail If you have any lab test that is abnormal or we need to change your treatment, we will call you to review the results.   Testing/Procedures: none   Follow-Up: At [facility], you and your health needs are our priority.  As part of our continuing mission to provide you with exceptional heart care, we have created designated Provider Care Teams.  These Care Teams include your primary Cardiologist (physician) and Advanced Practice Providers (APPs -  Physician Assistants and Nurse Practitioners) who all work together to provide you with the care you need, when you need it.   Your next appointment:   4 month(s)  The format for your next appointment:   In Person  Provider:   You may see Dorris Carnes, MD or one of the following Advanced Practice Providers on your designated Care Team:    Richardson Dopp, PA-C  Robbie Lis, [place]   Other Instructions

## 2020-08-20 LAB — BASIC METABOLIC PANEL
BUN/Creatinine Ratio: 23 (ref 12–28)
BUN: 20 mg/dL (ref 8–27)
CO2: 29 mmol/L (ref 20–29)
Calcium: 10.1 mg/dL (ref 8.7–10.3)
Chloride: 99 mmol/L (ref 96–106)
Creatinine, Ser: 0.88 mg/dL (ref 0.57–1.00)
GFR calc Af Amer: 72 mL/min/{1.73_m2} (ref >59)
GFR calc non Af Amer: 63 mL/min/{1.73_m2} (ref >59)
Glucose: 104 mg/dL — ABNORMAL HIGH (ref 65–99)
Potassium: 3.3 mmol/L — ABNORMAL LOW (ref 3.5–5.2)
Sodium: 142 mmol/L (ref 134–144)

## 2020-08-20 LAB — LIPID PANEL
Chol/HDL Ratio: 1.9 ratio (ref 0.0–4.4)
Cholesterol, Total: 122 mg/dL (ref 100–199)
HDL: 64 mg/dL (ref >39)
LDL Chol Calc (NIH): 34 mg/dL (ref 0–99)
Triglycerides: 141 mg/dL (ref 0–149)
VLDL Cholesterol Cal: 24 mg/dL (ref 5–40)

## 2020-08-23 ENCOUNTER — Telehealth: Payer: Self-pay | Admitting: *Deleted

## 2020-08-23 MED ORDER — POTASSIUM CHLORIDE CRYS ER 20 MEQ PO TBCR: 20.0000 meq | EXTENDED_RELEASE_TABLET | ORAL | 3 refills | Status: DC

## 2020-08-23 NOTE — Telephone Encounter (Signed)
Patient informed of results and recommendations.  She will increase potassium to every other day.  She has an appointment with Dr. Yong Channel on Jan 6 and would like to have BMET checked at that appointment.  I will forward her lab results to Dr. Ansel Bong office.

## 2020-08-23 NOTE — Telephone Encounter (Signed)
-----   Message from [place], MD sent at 08/21/2020 10:54 PM EST ----- LIpids are excellent  Kidney function is normal Electrolytes:   KCL 3.3    Taking KCL 20 meq 1x per week Woiuld take it every other day Check BMEt in a few wks

## 2020-08-25 ENCOUNTER — Other Ambulatory Visit: Payer: Self-pay | Admitting: Family Medicine

## 2020-09-06 ENCOUNTER — Telehealth: Payer: Self-pay | Admitting: *Deleted

## 2020-09-06 NOTE — Telephone Encounter (Signed)
Completed call/chart review for CLEAR research study for visit [id number]. All concomitant medications have been reviewed and updated if applicable. No new AE's or SAE's to report to sponsor at this time. Subject is currently on IP and next appointment is confirmed for March 28th, 2022 @ 0900.

## 2020-09-12 DIAGNOSIS — H401413 Capsular glaucoma with pseudoexfoliation of lens, right eye, severe stage: Secondary | ICD-10-CM | POA: Diagnosis not present

## 2020-09-12 DIAGNOSIS — H43813 Vitreous degeneration, bilateral: Secondary | ICD-10-CM | POA: Diagnosis not present

## 2020-09-12 DIAGNOSIS — Z961 Presence of intraocular lens: Secondary | ICD-10-CM | POA: Diagnosis not present

## 2020-09-12 DIAGNOSIS — H401422 Capsular glaucoma with pseudoexfoliation of lens, left eye, moderate stage: Secondary | ICD-10-CM | POA: Diagnosis not present

## 2020-09-14 NOTE — Progress Notes (Signed)
Phone [phone] In person visit   Subjective:   [name] is a 80 y.o. year old very pleasant female patient who presents for/with See problem oriented charting Chief Complaint  Patient presents with  . Hypertension  . Hyperlipidemia  . Hypothyroidism   This visit occurred during the SARS-CoV-2 public health emergency.  Safety protocols were in place, including screening questions prior to the visit, additional usage of staff PPE, and extensive cleaning of exam room while observing appropriate contact time as indicated for disinfecting solutions.   Past Medical History-  Patient Active Problem List   Diagnosis Date Noted  . CAD (coronary artery disease) s/p stent mCx 05/2019 06/02/2019    Priority: High  . Grieving 02/18/2017    Priority: High  . Osteoporosis 11/05/2016    Priority: High  . Aortic atherosclerosis (HCC) 09/15/2020    Priority: Medium  . Chest pain 12/29/2018    Priority: Medium  . Primary open-angle glaucoma 11/20/2017    Priority: Medium  . Hypothyroidism 07/01/2013    Priority: Medium  . Hypertension 11/02/2012    Priority: Medium  . Hyperlipidemia 07/24/2011    Priority: Medium  . 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 History of adenomatous polyp of colon 09/30/2018    Priority: Low  . Medicare annual wellness visit, subsequent 09/26/2014    Priority: Low  . Vitamin D deficiency 11/02/2012    Priority: Low  . Obesity (BMI 30-39.9) 07/24/2011    Priority: Low  . PAD (peripheral artery disease) (HCC) 09/15/2020  . Statin myopathy 07/01/2019  . Hypokalemia 06/02/2019  . NSTEMI (non-ST elevated myocardial infarction) (HCC) 05/31/2019  . Pseudophakia of both eyes 11/20/2017    Medications- reviewed and updated Current Outpatient Medications  Medication Sig Dispense Refill  . AMBULATORY NON FORMULARY MEDICATION Take 180 mg by mouth daily. Medication Name: 180 mg bempedoic acid  vs a placebo, CLEAR Research Study drug provided    . amLODipine (NORVASC) 2.5 MG tablet TAKE 1 TABLET BY MOUTH DAILY. 90 tablet 3  . aspirin EC 81 MG tablet Take 81 mg by mouth daily.    . Bimatoprost (LUMIGAN OP) Place 1 drop into both eyes at bedtime.     . chlorthalidone (HYGROTON) 25 MG tablet TAKE 1 TABLET ONCE DAILY. 90 tablet 0  . Cholecalciferol (VITAMIN D-3) 5000 UNITS TABS Take 5,000 mg by mouth daily.    . dorzolamide-timolol (COSOPT) 22.3-6.8 MG/ML ophthalmic solution Place 1 drop into both eyes 2 (two) times daily.     . Evolocumab (REPATHA SURECLICK) 140 MG/ML SOAJ Inject 1 pen into the skin every 14 (fourteen) days. 2 pen 11  . famotidine (PEPCID) 20 MG tablet Take 1 tablet (20 mg total) by mouth 2 (two) times daily. 180 tablet 3  . icosapent Ethyl (VASCEPA) 1 g capsule Take 1 capsule (1 g total) by mouth 2 (two) times daily. 60 capsule 11  . levothyroxine (SYNTHROID) 50 MCG tablet TAKE 1 TABLET BEFORE BREAKFAST. 90 tablet 0  . metoprolol tartrate (LOPRESSOR) 25 MG tablet TAKE 1 TABLET BY MOUTH TWICE DAILY. 180 tablet 0  . nitroGLYCERIN (NITROSTAT) 0.4 MG SL tablet Place 1 tablet (0.4 mg total) under the tongue every 5 (five) minutes x 3 doses as needed for chest pain. 25 tablet 12  . pilocarpine (PILOCAR) 1 % ophthalmic solution Place 1 drop into both eyes 2 (two) times daily.     Bertram Gala Glycol-Propyl Glycol (  SYSTANE OP) Place 1-2 drops into both eyes as needed (for dry eyes).     . potassium chloride SA (KLOR-CON) 20 MEQ tablet Take 1 tablet (20 mEq total) by mouth every other day. 36 tablet 3  . Probiotic Product (PROBIOTIC DAILY) CAPS Take 1 capsule by mouth daily. Reported on 10/10/2015    . ticagrelor (BRILINTA) 90 MG TABS tablet Take 1 tablet (90 mg total) by mouth 2 (two) times daily. 60 tablet 11   No current facility-administered medications for this visit.     Objective:  BP 131/66   Pulse 67   Temp 98.1 F (36.7 C) (Temporal)   Ht 5\' 3"  (1.6 m)   Wt 148 lb  3.2 oz (67.2 kg)   SpO2 99%   BMI 26.25 kg/m  Gen: NAD, resting comfortably CV: RRR no murmurs rubs or gallops Lungs: CTAB no crackles, wheeze, rhonchi Abdomen: soft/nontender/nondistended/normal bowel sounds.  Ext: trace edema Skin: warm, dry     Assessment and Plan   #CAD- follows with Dr. Harrington Challenger #hyperlipidemia #PAD listed by Dr. Harrington Challenger due to aortic atherosclerosis S: Medication: Compliant with aspirin 81 mg and Brilinta 90 mg twice daily.  For cholesterol on Repatha-very slight rash on this (had worse if she is on Praluent).  Also on clear study and Vascepa  Patient denies chest pain or shortness of breath Lab Results  Component Value Date   CHOL 122 08/19/2020   HDL 64 08/19/2020   LDLCALC 34 08/19/2020   LDLDIRECT 114.0 04/29/2019   TRIG 141 08/19/2020   CHOLHDL 1.9 08/19/2020   A/P: CAD asymptomatic-continue current medication  PAD/aortic atherosclerosis stable- continue current meds  Hyperlipidemia-levels looked excellent in December-continue current medications  #Osteoporosis-patient has not tolerated Prolia due to achiness and has had a lot of dental issues so not thought to be ideal candidate for bisphosphonates #Vitamin D deficiency S: Medication: Vit D3 5000U daily.  Does not tolerate calcium. Last vitamin D Lab Results  Component Value Date   VD25OH 45 05/12/2020  A/P: Vitamin D deficiency well-controlled-continue current medications.  We discussed repeating bone density or referring to endocrine for their opinion-patient declines for now-once for pandemic to be in a more stable position   #hypothyroidism S: compliant On thyroid medication- levothyroxine 50Mcg Lab Results  Component Value Date   TSH 1.82 05/12/2020   A/P:Well-controlled on last check-continue current medications  #hypertension S: medication: Metoprolol 1M BIDg, Amlodipine 2.5Mg  (takes 2 if BP goes up), chlorthalidone 25Mg  (potassium every other day) Home readings #s: Home cuff accurate  on May 12, 2020 visit-Home readings look excellent overall with avg <135/85 BP Readings from Last 3 Encounters:  09/15/20 131/66  08/19/20 (!) 130/52  07/28/20 120/70  A/P: Reasonable control-continue current medications  #GERD-patient remains on Pepcid instead of PPI-thankful with osteoporosis  #Hypokalemia-potassium was increased to every other day-we will check CMP today  # up to date on covid vaccination- she will call us back with 3rd dose date  # cant walk for 200 ft without stopping- will fill out handicap placard Recommended follow up: Return in about 4 months (around 01/13/2021). Future Appointments  Date Time Provider [place]  10/27/2020  9:15 AM [facility]  12/05/2020  9:00 AM [place] [facility] None  06/01/2021  8:30 AM [facility]   Lab/Order associations:   ICD-10-CM   1. Coronary artery disease involving native coronary artery of native heart without angina pectoris  I25.10   2. Primary  hypertension  I10 Comprehensive metabolic panel  3. Hypothyroidism, unspecified type  E03.9 TSH  4. Mixed hyperlipidemia  E78.2   5. Vitamin D deficiency  E55.9   6. PAD (peripheral artery disease) (HCC)  I73.9   7. Aortic atherosclerosis ([place])  I70.0    Return precautions advised.  Garret Reddish, MD

## 2020-09-14 NOTE — Patient Instructions (Incomplete)
Health Maintenance Due  Topic Date Due  . COVID-19 Vaccine (3 - Booster for [employer] series) Patient will call back with date.  04/24/2020  . TETANUS/TDAP you are due for this but insurance does not cover this in office. Cheaper for you to get at pharmacy. Would be covered if you get a cut/scrape. Let us know if you get this 07/17/2020   Please stop by lab before you go If you have mychart- we will send your results within 3 business days of [place] receiving them.  If you do not have mychart- we will call you about results within 5 business days of [place] receiving them.  *please also note that you will see labs on mychart as soon as they post. I will later go in and write notes on them- will say "notes from Dr. Durene Cal"  Team make sure she gets handicap sheet  If you want- can schedule your physical in 8 months as well - 05/12/20 or later

## 2020-09-15 ENCOUNTER — Ambulatory Visit: Payer: Medicare PPO | Admitting: Family Medicine

## 2020-09-15 ENCOUNTER — Encounter: Payer: Self-pay | Admitting: Family Medicine

## 2020-09-15 ENCOUNTER — Other Ambulatory Visit: Payer: Self-pay

## 2020-09-15 VITALS — BP 131/66 | HR 67 | Temp 98.1°F | Ht 63.0 in | Wt 148.2 lb

## 2020-09-15 DIAGNOSIS — E039 Hypothyroidism, unspecified: Secondary | ICD-10-CM | POA: Diagnosis not present

## 2020-09-15 DIAGNOSIS — I700 Atherosclerosis of aorta: Secondary | ICD-10-CM

## 2020-09-15 DIAGNOSIS — E782 Mixed hyperlipidemia: Secondary | ICD-10-CM | POA: Diagnosis not present

## 2020-09-15 DIAGNOSIS — I2510 Atherosclerotic heart disease of native coronary artery without angina pectoris: Secondary | ICD-10-CM

## 2020-09-15 DIAGNOSIS — I739 Peripheral vascular disease, unspecified: Secondary | ICD-10-CM | POA: Diagnosis not present

## 2020-09-15 DIAGNOSIS — E559 Vitamin D deficiency, unspecified: Secondary | ICD-10-CM

## 2020-09-15 DIAGNOSIS — I10 Essential (primary) hypertension: Secondary | ICD-10-CM | POA: Diagnosis not present

## 2020-09-15 LAB — COMPREHENSIVE METABOLIC PANEL
ALT: 15 U/L (ref 0–35)
AST: 15 U/L (ref 0–37)
Albumin: 4.6 g/dL (ref 3.5–5.2)
Alkaline Phosphatase: 43 U/L (ref 39–117)
BUN: 24 mg/dL — ABNORMAL HIGH (ref 6–23)
CO2: 31 mEq/L (ref 19–32)
Calcium: 9.6 mg/dL (ref 8.4–10.5)
Chloride: 101 mEq/L (ref 96–112)
Creatinine, Ser: 0.98 mg/dL (ref 0.40–1.20)
GFR: 55.05 mL/min — ABNORMAL LOW (ref >60.00)
Glucose, Bld: 93 mg/dL (ref 70–99)
Potassium: 3.3 mEq/L — ABNORMAL LOW (ref 3.5–5.1)
Sodium: 140 mEq/L (ref 135–145)
Total Bilirubin: 0.7 mg/dL (ref 0.2–1.2)
Total Protein: 6.8 g/dL (ref 6.0–8.3)

## 2020-09-15 LAB — TSH: TSH: 2.36 u[IU]/mL (ref 0.35–4.50)

## 2020-09-19 ENCOUNTER — Other Ambulatory Visit: Payer: Self-pay

## 2020-09-19 DIAGNOSIS — E876 Hypokalemia: Secondary | ICD-10-CM

## 2020-09-20 ENCOUNTER — Other Ambulatory Visit: Payer: Self-pay

## 2020-09-28 ENCOUNTER — Other Ambulatory Visit: Payer: Self-pay

## 2020-09-28 ENCOUNTER — Telehealth: Payer: Self-pay

## 2020-09-28 DIAGNOSIS — I10 Essential (primary) hypertension: Secondary | ICD-10-CM

## 2020-09-28 NOTE — Telephone Encounter (Signed)
Called and spoke with pt and lab appointment made.

## 2020-09-28 NOTE — Telephone Encounter (Signed)
If she would like we can order a BMP under hypertension and check potassium levels through a BMP.  Spasm sometimes can happen even with normal potassium levels.  If episode like this happen again can try heat and rest-if she has chest pain or shortness of breath should seek care immediately

## 2020-09-28 NOTE — Telephone Encounter (Signed)
Pt states she has some spasms near her left shoulder and under her arm. She states she is doing what she is supposed to do in regards to her potassium and did not know if her potassium caused these spasms. Her bp readings last night were high 174/88 hr 96, 168/89 hr 98, 158/74 and this morning it was 134/69 hr 70 and 115/58 hr 74. She states she does not think its anything serious but she has never had anything like this happen before so it scared her and she is doing her potassium daily.

## 2020-09-28 NOTE — Telephone Encounter (Signed)
Pt is requesting a CMA to call her. Pt states she had a little "episode" last night and just wants to let Dr. Yong Channel know.

## 2020-10-03 ENCOUNTER — Encounter: Payer: Self-pay | Admitting: Family Medicine

## 2020-10-03 ENCOUNTER — Ambulatory Visit: Payer: Medicare PPO | Admitting: Family Medicine

## 2020-10-03 ENCOUNTER — Other Ambulatory Visit: Payer: Self-pay

## 2020-10-03 VITALS — BP 144/62 | HR 67 | Temp 98.0°F | Ht 62.0 in | Wt 148.6 lb

## 2020-10-03 DIAGNOSIS — I10 Essential (primary) hypertension: Secondary | ICD-10-CM

## 2020-10-03 DIAGNOSIS — E782 Mixed hyperlipidemia: Secondary | ICD-10-CM

## 2020-10-03 MED ORDER — POTASSIUM CHLORIDE CRYS ER 20 MEQ PO TBCR: 20.0000 meq | EXTENDED_RELEASE_TABLET | Freq: Every day | ORAL | 3 refills | Status: DC

## 2020-10-03 MED ORDER — AMLODIPINE BESYLATE 5 MG PO TABS: 5.0000 mg | ORAL_TABLET | Freq: Every day | ORAL | 3 refills | Status: DC

## 2020-10-03 NOTE — Progress Notes (Signed)
Phone [phone] In person visit   Subjective:   [name] is a 80 y.o. year old very pleasant female patient who presents for/with See problem oriented charting Chief Complaint  Patient presents with  . blood pressure    Patient wants to discuss what readings with her BP she should worry about   . lab work    Patient was scheduled for Lab work Wednesday but she wants it done today.    This visit occurred during the SARS-CoV-2 public health emergency.  Safety protocols were in place, including screening questions prior to the visit, additional usage of staff PPE, and extensive cleaning of exam room while observing appropriate contact time as indicated for disinfecting solutions.   Past Medical History-  Patient Active Problem List   Diagnosis Date Noted  . CAD (coronary artery disease) s/p stent mCx 05/2019 06/02/2019    Priority: High  . Grieving 02/18/2017    Priority: High  . Osteoporosis 11/05/2016    Priority: High  . Aortic atherosclerosis ([place]) 09/15/2020    Priority: Medium  . Chest pain 12/29/2018    Priority: Medium  . Primary open-angle glaucoma 11/20/2017    Priority: Medium  . Hypothyroidism 07/01/2013    Priority: Medium  . Hypertension 11/02/2012    Priority: Medium  . Hyperlipidemia 07/24/2011    Priority: Medium  . 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 History of adenomatous polyp of colon 09/30/2018    Priority: Low  . Medicare annual wellness visit, subsequent 09/26/2014    Priority: Low  . Vitamin D deficiency 11/02/2012    Priority: Low  . Obesity (BMI 30-39.9) 07/24/2011    Priority: Low  . PAD (peripheral artery disease) ([place]) 09/15/2020  . Statin myopathy 07/01/2019  . Hypokalemia 06/02/2019  . NSTEMI (non-ST elevated myocardial infarction) ([place]) 05/31/2019  . 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 Postsurgical  states following surgery of eye and adnexa 06/11/2018  . Capsular glaucoma of right eye with pseudoexfoliation (PXF) of lens, severe stage 05/07/2018  . Pseudophakia of both eyes 11/20/2017    Medications- reviewed and updated Current Outpatient Medications  Medication Sig Dispense Refill  . AMBULATORY NON FORMULARY MEDICATION Take 180 mg by mouth daily. Medication Name: 180 mg bempedoic acid vs a placebo, CLEAR Research Study drug provided    . aspirin EC 81 MG tablet Take 81 mg by mouth daily.    . Bimatoprost (LUMIGAN OP) Place 1 drop into both eyes at bedtime.     . chlorthalidone (HYGROTON) 25 MG tablet TAKE 1 TABLET ONCE DAILY. 90 tablet 0  . Cholecalciferol (VITAMIN D-3) 5000 UNITS TABS Take 5,000 mg by mouth daily.    . dorzolamide-timolol (COSOPT) 22.3-6.8 MG/ML ophthalmic solution Place 1 drop into both eyes 2 (two) times daily.     . Evolocumab (REPATHA SURECLICK) 810 MG/ML SOAJ Inject 1 pen into the skin every 14 (fourteen) days. 2 pen 11  . famotidine (PEPCID) 20 MG tablet Take 1 tablet (20 mg total) by mouth 2 (two) times daily. 180 tablet 3  . icosapent Ethyl (VASCEPA) 1 g capsule Take 1 capsule (1 g total) by mouth 2 (two) times daily. 60 capsule 11  . levothyroxine (SYNTHROID) 50 MCG tablet TAKE 1 TABLET BEFORE BREAKFAST. 90 tablet 0  . metoprolol tartrate (LOPRESSOR) 25 MG tablet TAKE 1 TABLET BY MOUTH TWICE DAILY. 180 tablet 0  .  nitroGLYCERIN (NITROSTAT) 0.4 MG SL tablet Place 1 tablet (0.4 mg total) under the tongue every 5 (five) minutes x 3 doses as needed for chest pain. 25 tablet 12  . pilocarpine (PILOCAR) 1 % ophthalmic solution Place 1 drop into both eyes 2 (two) times daily.     Vladimir Faster Glycol-Propyl Glycol (SYSTANE OP) Place 1-2 drops into both eyes as needed (for dry eyes).     . Probiotic Product (PROBIOTIC DAILY) CAPS Take 1 capsule by mouth daily. Reported on 10/10/2015    . ticagrelor (BRILINTA) 90 MG TABS tablet Take 1 tablet (90 mg total) by mouth 2  (two) times daily. 60 tablet 11  . amLODipine (NORVASC) 5 MG tablet Take 1 tablet (5 mg total) by mouth daily. 90 tablet 3  . potassium chloride SA (KLOR-CON) 20 MEQ tablet Take 1 tablet (20 mEq total) by mouth daily. 90 tablet 3   No current facility-administered medications for this visit.     Objective:  BP (!) 144/62   Pulse 67   Temp 98 F (36.7 C) (Temporal)   Ht 5\' 2"  (1.575 m)   Wt 148 lb 9.6 oz (67.4 kg)   SpO2 98%   BMI 27.18 kg/m  Gen: NAD, resting comfortably CV: RRR no murmurs rubs or gallops Lungs: CTAB no crackles, wheeze, rhonchi Ext: trace  To 1+ edema (monitor on higher amlodipine) Skin: warm, dry    Assessment and Plan   #hypertension S: medication: metoprolol 25 mg BID (only once a day), amlodipine 2.5 mg (takes 2 if goes up), chlorthalidone 25 mg (potassium every other day) Home readings #s:  avg 139.25 68  Over 25 home readings since the 20th (has been checking a lot as has been concerned). Prior 25 readings from 1-1 to 1-20 were much better at 135.4 with controlled diastolic BP Readings from Last 3 Encounters:  10/03/20 (!) 144/62  09/15/20 131/66  08/19/20 (!) 130/52  A/P: blood pressure mildly elevated today. Home readings are reasonable but would prefer average less than 135 if possible- at home. Will recheck 1 month - BP trended up with more frequent checks thus reason for reducing checks to 3-4x a week -up on potassium to every day  #hyperlipidemia S: Medication:clear study and repatha  Lab Results  Component Value Date   CHOL 122 08/19/2020   HDL 64 08/19/2020   LDLCALC 34 08/19/2020   LDLDIRECT 114.0 04/29/2019   TRIG 141 08/19/2020   CHOLHDL 1.9 08/19/2020   A/P: excellent control off zetia- may remain off  Recommended follow up: Return in about 1 month (around 11/03/2020) for follow up- or sooner if needed. Future Appointments  Date Time Provider [place]  10/27/2020  9:15 AM [facility]   12/05/2020  9:00 AM [place] [facility] None  01/18/2021 10:40 AM Marin Olp, MD [facility]  06/01/2021  8:30 AM [facility] HEALTH COACH [facility]    Lab/Order associations: No diagnosis found.  Meds ordered this encounter  Medications  . amLODipine (NORVASC) 5 MG tablet    Sig: Take 1 tablet (5 mg total) by mouth daily.    Dispense:  90 tablet    Refill:  3  . potassium chloride SA (KLOR-CON) 20 MEQ tablet    Sig: Take 1 tablet (20 mEq total) by mouth daily.    Dispense:  90 tablet    Refill:  3    Changed to daily January 2022 from every other day    Return  precautions advised.  Garret Reddish, MD

## 2020-10-03 NOTE — Addendum Note (Signed)
Addended by: Brandy Hale on: 10/03/2020 02:47 PM   Modules accepted: Orders

## 2020-10-03 NOTE — Patient Instructions (Addendum)
Health Maintenance Due  Topic Date Due  . COVID-19 Vaccine (3 - Booster for [employer] series) Will call with the dates.  04/24/2020  . TETANUS/TDAP - due for this if you get a cut or scrape or can get at pharmacy now 07/17/2020   Lets reduce checking blood pressure to 3-4x a week. Increase amlodipine to 5 mg in the evening - follow up in 1 month for recheck  Please stop by lab before you go If you have mychart- we will send your results within 3 business days of [place] receiving them.  If you do not have mychart- we will call you about results within 5 business days of [place] receiving them.  *please also note that you will see labs on mychart as soon as they post. I will later go in and write notes on them- will say "notes from Dr. Yong Channel"   Recommended follow up: No follow-ups on file.

## 2020-10-04 LAB — BASIC METABOLIC PANEL
BUN: 23 mg/dL (ref 6–23)
CO2: 29 mEq/L (ref 19–32)
Calcium: 9.6 mg/dL (ref 8.4–10.5)
Chloride: 101 mEq/L (ref 96–112)
Creatinine, Ser: 0.88 mg/dL (ref 0.40–1.20)
GFR: 62.62 mL/min (ref >60.00)
Glucose, Bld: 85 mg/dL (ref 70–99)
Potassium: 4 mEq/L (ref 3.5–5.1)
Sodium: 139 mEq/L (ref 135–145)

## 2020-10-05 ENCOUNTER — Other Ambulatory Visit: Payer: Self-pay

## 2020-10-17 ENCOUNTER — Other Ambulatory Visit: Payer: Medicare PPO

## 2020-10-21 ENCOUNTER — Other Ambulatory Visit: Payer: Self-pay | Admitting: Internal Medicine

## 2020-10-21 ENCOUNTER — Other Ambulatory Visit: Payer: Self-pay | Admitting: Family Medicine

## 2020-10-27 ENCOUNTER — Other Ambulatory Visit: Payer: Medicare PPO | Admitting: *Deleted

## 2020-10-27 ENCOUNTER — Ambulatory Visit: Payer: Medicare PPO | Admitting: Internal Medicine

## 2020-10-27 ENCOUNTER — Other Ambulatory Visit: Payer: Self-pay

## 2020-10-27 ENCOUNTER — Other Ambulatory Visit: Payer: Self-pay | Admitting: *Deleted

## 2020-10-27 DIAGNOSIS — E782 Mixed hyperlipidemia: Secondary | ICD-10-CM

## 2020-10-27 LAB — LIPID PANEL
Chol/HDL Ratio: 2 ratio (ref 0.0–4.4)
Cholesterol, Total: 131 mg/dL (ref 100–199)
HDL: 64 mg/dL (ref >39)
LDL Chol Calc (NIH): 53 mg/dL (ref 0–99)
Triglycerides: 65 mg/dL (ref 0–149)
VLDL Cholesterol Cal: 14 mg/dL (ref 5–40)

## 2020-11-02 NOTE — Progress Notes (Signed)
Phone [phone] In person visit   Subjective:   [name] is a 80 y.o. year old very pleasant female patient who presents for/with See problem oriented charting Chief Complaint  Patient presents with   Hypertension   Hyperlipidemia   This visit occurred during the SARS-CoV-2 public health emergency.  Safety protocols were in place, including screening questions prior to the visit, additional usage of staff PPE, and extensive cleaning of exam room while observing appropriate contact time as indicated for disinfecting solutions.   Past Medical History-  Patient Active Problem List   Diagnosis Date Noted   CAD (coronary artery disease) s/p stent mCx 05/2019 06/02/2019    Priority: High   Grieving 02/18/2017    Priority: High   Osteoporosis 11/05/2016    Priority: High   Aortic atherosclerosis ([place]) 09/15/2020    Priority: Medium   Chest pain 12/29/2018    Priority: Medium   Primary open-angle glaucoma 11/20/2017    Priority: Medium   Hypothyroidism 07/01/2013    Priority: Medium   Hypertension 11/02/2012    Priority: Medium   Hyperlipidemia 07/24/2011    Priority: Medium   hx: breast cancer, IDC (Microinvasive) w DCIS, 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 - 07/10/2011    Priority: Medium   History of adenomatous polyp of colon 09/30/2018    Priority: Low   Medicare annual wellness visit, subsequent 09/26/2014    Priority: Low   Vitamin D deficiency 11/02/2012    Priority: Low   Obesity (BMI 30-39.9) 07/24/2011    Priority: Low   PAD (peripheral artery disease) ([place]) 09/15/2020   Statin myopathy 07/01/2019   Hypokalemia 06/02/2019   NSTEMI (non-ST elevated myocardial infarction) ([place]) 05/31/2019   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 Medication Name: 180 mg bempedoic acid vs a placebo, CLEAR Research Study drug provided     amLODipine (NORVASC) 5 MG tablet Take 1 tablet (5 mg total) by mouth daily. 90 tablet 3   aspirin EC 81 MG tablet Take 81 mg by mouth daily.     Bimatoprost (LUMIGAN OP) Place 1 drop into both eyes at bedtime.      chlorthalidone (HYGROTON) 25 MG tablet TAKE 1 TABLET ONCE DAILY. 90 tablet 0   Cholecalciferol (VITAMIN D-3) 5000 UNITS TABS Take 5,000 mg by mouth daily.     dorzolamide-timolol (COSOPT) 22.3-6.8 MG/ML ophthalmic solution Place 1 drop into both eyes 2 (two) times daily.      famotidine (PEPCID) 20 MG tablet Take 1 tablet (20 mg total) by mouth 2 (two) times daily. 180 tablet 3   icosapent Ethyl (VASCEPA) 1 g capsule Take 1 capsule (1 g total) by mouth 2 (two) times daily. 60 capsule 11   levothyroxine (SYNTHROID) 50 MCG tablet TAKE 1 TABLET BEFORE BREAKFAST. 90 tablet 0   metoprolol tartrate (LOPRESSOR) 25 MG tablet TAKE 1 TABLET BY MOUTH TWICE DAILY. 180 tablet 0   nitroGLYCERIN (NITROSTAT) 0.4 MG SL tablet Place 1 tablet (0.4 mg total) under the tongue every 5 (five) minutes x 3 doses as needed for chest pain. 25 tablet 12   pilocarpine (PILOCAR) 1 %  ophthalmic solution Place 1 drop into both eyes 2 (two) times daily.      Polyethyl Glycol-Propyl Glycol (SYSTANE OP) Place 1-2 drops into both eyes as needed (for dry eyes).      potassium chloride SA (KLOR-CON) 20 MEQ tablet Take 1 tablet (20 mEq total) by mouth daily. 90 tablet 3   Probiotic Product (PROBIOTIC DAILY) CAPS Take 1 capsule by mouth daily. Reported on 10/10/2015     REPATHA SURECLICK 638 MG/ML SOAJ INJECT CONTENTS OF 1 PEN (140MG ) INTO SKIN EVERY 14 DAYS. 2 mL 11   ticagrelor (BRILINTA) 90 MG TABS tablet Take 1  tablet (90 mg total) by mouth 2 (two) times daily. 60 tablet 11   No current facility-administered medications for this visit.     Objective:  BP 126/63 Comment: average over last 11 home readings   Pulse 75    Temp 97.9 F (36.6 C) (Temporal)    Ht 5\' 2"  (1.575 m)    Wt 149 lb 9.6 oz (67.9 kg)    SpO2 98%    BMI 27.36 kg/m  Gen: NAD, resting comfortably CV: RRR no murmurs rubs or gallops Lungs: CTAB no crackles, wheeze, rhonchi Ext: trace edema Skin: warm, dry    Assessment and Plan   #hypertension S: medication: Metoprolol 25 mg twice daily, amlodipine 5 mg (increase from 2.5 mg last visit), chlorthalidone 25 mg with potassium every other day Home readings #s: Last visit home average is 139.25/68 but patient has been frequently checking and has some anxiety around this-readings prior to frequent checks were 135.4 with controlled diastolic  Since increasing amlodipine to 5 mg- BP average over 11 readings was 125.72/63  Also joined [facility] yesterday BP Readings from Last 3 Encounters:  11/03/20 126/63  10/03/20 (!) 144/62  09/15/20 131/66  A/P:  Did not take BP meds today so running higher at 162/80 but will input home values. Home readings look excellent- continue current meds. Have verified home cuff in past  #hyperlipidemia S: Medication:on clear study plus on repeatha and vascepa  Lab Results  Component Value Date   CHOL 131 10/27/2020   HDL 64 10/27/2020   LDLCALC 53 10/27/2020   LDLDIRECT 114.0 04/29/2019   TRIG 65 10/27/2020   CHOLHDL 2.0 10/27/2020   A/P: excellent control- continue current meds  Recommended follow up:  Keep may visit unless you need [place] sooner  Future Appointments  Date Time Provider [place]  12/05/2020  9:00 AM [place] [facility] None  01/17/2021  8:40 AM Fay Records, MD [facility]  01/18/2021 10:40 AM Marin Olp, MD [facility]  06/01/2021  8:30 AM [facility] HEALTH COACH [facility]    Lab/Order associations:   ICD-10-CM   1. Primary hypertension  I10   2. Mixed hyperlipidemia  E78.2     Return precautions advised.  Garret Reddish, MD

## 2020-11-02 NOTE — Patient Instructions (Addendum)
Health Maintenance Due  Topic Date Due  . COVID-19 Vaccine (3 - Booster for [place] series)- send [place] the dates 04/24/2020  . TETANUS/TDAP - due for this at pharmacy 07/17/2020   No labs needed today  Thanks for monitoring blood pressure at home- #s look great- lets continue current meds. Ok to just do once a week and if increases can go back to 3x a week and let me know the readings  Keep may visit

## 2020-11-03 ENCOUNTER — Encounter: Payer: Self-pay | Admitting: Family Medicine

## 2020-11-03 ENCOUNTER — Other Ambulatory Visit: Payer: Self-pay

## 2020-11-03 ENCOUNTER — Ambulatory Visit: Payer: Medicare PPO | Admitting: Family Medicine

## 2020-11-03 VITALS — BP 126/63 | HR 75 | Temp 97.9°F | Ht 62.0 in | Wt 149.6 lb

## 2020-11-03 DIAGNOSIS — I10 Essential (primary) hypertension: Secondary | ICD-10-CM | POA: Diagnosis not present

## 2020-11-03 DIAGNOSIS — E782 Mixed hyperlipidemia: Secondary | ICD-10-CM | POA: Diagnosis not present

## 2020-11-20 ENCOUNTER — Other Ambulatory Visit: Payer: Self-pay | Admitting: Family Medicine

## 2020-11-21 ENCOUNTER — Other Ambulatory Visit: Payer: Self-pay | Admitting: Family Medicine

## 2020-12-05 ENCOUNTER — Other Ambulatory Visit: Payer: Self-pay

## 2020-12-05 ENCOUNTER — Encounter: Payer: Medicare PPO | Admitting: *Deleted

## 2020-12-05 VITALS — BP 131/49

## 2020-12-05 DIAGNOSIS — Z006 Encounter for examination for normal comparison and control in clinical research program: Secondary | ICD-10-CM

## 2020-12-05 NOTE — Research (Signed)
Patient came into research clinic today for visit T18,M48 in CLEAR research study. There are no AE's or SAE's to report to sponsor at this time. All concomitant medications have been reviewed and updated if applicable. Subject was 100% compliant with IP and new IP was dispensed. Next appointment, which will be the end of study appointment, was scheduled for Monday, May 16th, 2022 @ 0900 am.

## 2020-12-18 ENCOUNTER — Other Ambulatory Visit: Payer: Self-pay | Admitting: Family Medicine

## 2020-12-30 ENCOUNTER — Emergency Department (HOSPITAL_BASED_OUTPATIENT_CLINIC_OR_DEPARTMENT_OTHER): Payer: Medicare PPO

## 2020-12-30 ENCOUNTER — Other Ambulatory Visit (HOSPITAL_BASED_OUTPATIENT_CLINIC_OR_DEPARTMENT_OTHER): Payer: Self-pay

## 2020-12-30 ENCOUNTER — Telehealth: Payer: Self-pay | Admitting: Internal Medicine

## 2020-12-30 ENCOUNTER — Other Ambulatory Visit: Payer: Self-pay

## 2020-12-30 ENCOUNTER — Encounter (HOSPITAL_BASED_OUTPATIENT_CLINIC_OR_DEPARTMENT_OTHER): Payer: Self-pay

## 2020-12-30 ENCOUNTER — Emergency Department (HOSPITAL_BASED_OUTPATIENT_CLINIC_OR_DEPARTMENT_OTHER)
Admission: EM | Admit: 2020-12-30 | Discharge: 2020-12-30 | Disposition: A | Discharge status: Home / Self Care | Payer: Medicare PPO | Attending: Emergency Medicine | Admitting: Emergency Medicine

## 2020-12-30 DIAGNOSIS — I10 Essential (primary) hypertension: Secondary | ICD-10-CM | POA: Insufficient documentation

## 2020-12-30 DIAGNOSIS — M549 Dorsalgia, unspecified: Secondary | ICD-10-CM | POA: Diagnosis not present

## 2020-12-30 DIAGNOSIS — E039 Hypothyroidism, unspecified: Secondary | ICD-10-CM | POA: Diagnosis not present

## 2020-12-30 DIAGNOSIS — Z79899 Other long term (current) drug therapy: Secondary | ICD-10-CM | POA: Diagnosis not present

## 2020-12-30 DIAGNOSIS — R079 Chest pain, unspecified: Secondary | ICD-10-CM | POA: Diagnosis not present

## 2020-12-30 DIAGNOSIS — R072 Precordial pain: Secondary | ICD-10-CM | POA: Diagnosis not present

## 2020-12-30 DIAGNOSIS — Z7982 Long term (current) use of aspirin: Secondary | ICD-10-CM | POA: Diagnosis not present

## 2020-12-30 DIAGNOSIS — Z853 Personal history of malignant neoplasm of breast: Secondary | ICD-10-CM | POA: Diagnosis not present

## 2020-12-30 DIAGNOSIS — Z20822 Contact with and (suspected) exposure to covid-19: Secondary | ICD-10-CM | POA: Insufficient documentation

## 2020-12-30 DIAGNOSIS — I2510 Atherosclerotic heart disease of native coronary artery without angina pectoris: Secondary | ICD-10-CM | POA: Diagnosis not present

## 2020-12-30 LAB — CBC WITH DIFFERENTIAL/PLATELET
Abs Immature Granulocytes: 0.03 10*3/uL (ref 0.00–0.07)
Basophils Absolute: 0.1 10*3/uL (ref 0.0–0.1)
Basophils Relative: 1 %
Eosinophils Absolute: 0.1 10*3/uL (ref 0.0–0.5)
Eosinophils Relative: 2 %
HCT: 44 % (ref 36.0–46.0)
Hemoglobin: 14.2 g/dL (ref 12.0–15.0)
Immature Granulocytes: 0 %
Lymphocytes Relative: 19 %
Lymphs Abs: 1.5 10*3/uL (ref 0.7–4.0)
MCH: 30 pg (ref 26.0–34.0)
MCHC: 32.3 g/dL (ref 30.0–36.0)
MCV: 92.8 fL (ref 80.0–100.0)
Monocytes Absolute: 0.6 10*3/uL (ref 0.1–1.0)
Monocytes Relative: 8 %
Neutro Abs: 5.5 10*3/uL (ref 1.7–7.7)
Neutrophils Relative %: 70 %
Platelets: 322 10*3/uL (ref 150–400)
RBC: 4.74 MIL/uL (ref 3.87–5.11)
RDW: 12.7 % (ref 11.5–15.5)
WBC: 7.8 10*3/uL (ref 4.0–10.5)
nRBC: 0 % (ref 0.0–0.2)

## 2020-12-30 LAB — COMPREHENSIVE METABOLIC PANEL
ALT: 16 U/L (ref 0–44)
AST: 19 U/L (ref 15–41)
Albumin: 5 g/dL (ref 3.5–5.0)
Alkaline Phosphatase: 45 U/L (ref 38–126)
Anion gap: 11 (ref 5–15)
BUN: 22 mg/dL (ref 8–23)
CO2: 29 mmol/L (ref 22–32)
Calcium: 9.8 mg/dL (ref 8.9–10.3)
Chloride: 100 mmol/L (ref 98–111)
Creatinine, Ser: 0.92 mg/dL (ref 0.44–1.00)
GFR, Estimated: >60 mL/min (ref >60)
Glucose, Bld: 125 mg/dL — ABNORMAL HIGH (ref 70–99)
Potassium: 3.6 mmol/L (ref 3.5–5.1)
Sodium: 140 mmol/L (ref 135–145)
Total Bilirubin: 0.9 mg/dL (ref 0.3–1.2)
Total Protein: 7.5 g/dL (ref 6.5–8.1)

## 2020-12-30 LAB — TROPONIN I (HIGH SENSITIVITY)
Troponin I (High Sensitivity): 3 ng/L (ref <18)
Troponin I (High Sensitivity): 4 ng/L (ref <18)

## 2020-12-30 LAB — RESP PANEL BY RT-PCR (FLU A&B, COVID) ARPGX2
Influenza A by PCR: NEGATIVE
Influenza B by PCR: NEGATIVE
SARS Coronavirus 2 by RT PCR: NEGATIVE

## 2020-12-30 MED ORDER — NITROGLYCERIN 0.4 MG SL SUBL
0.4000 mg | SUBLINGUAL_TABLET | SUBLINGUAL | Status: DC | PRN
  Administered 2020-12-30 (×3): 0.4 mg via SUBLINGUAL
  Filled 2020-12-30: qty 1

## 2020-12-30 MED ORDER — FAMOTIDINE 20 MG PO TABS
20.0000 mg | ORAL_TABLET | Freq: Two times a day (BID) | ORAL | 0 refills | Status: DC
  Filled 2020-12-30: qty 30, 15d supply, fill #0

## 2020-12-30 MED ORDER — FAMOTIDINE 20 MG PO TABS
20.0000 mg | ORAL_TABLET | Freq: Once | ORAL | Status: AC
  Administered 2020-12-30: 20 mg via ORAL
  Filled 2020-12-30: qty 1

## 2020-12-30 MED ORDER — ASPIRIN 81 MG PO CHEW
243.0000 mg | CHEWABLE_TABLET | Freq: Once | ORAL | Status: AC
  Administered 2020-12-30: 243 mg via ORAL
  Filled 2020-12-30: qty 3

## 2020-12-30 NOTE — Telephone Encounter (Signed)
Sounds good

## 2020-12-30 NOTE — Telephone Encounter (Signed)
Patient seen in ED [facility] today    Spoke to Dr Rogene Houston    Trop x 2 normal    Patient sent home    Please set up appt to get seen in clinc by me or APP

## 2020-12-30 NOTE — ED Triage Notes (Signed)
Pt c/o mid sternal chest pain starting this morning with lightheadedness. Took 1 SL nitro with some relief. States hx of MI.

## 2020-12-30 NOTE — ED Provider Notes (Addendum)
[facility] EMERGENCY DEPT Provider Note   CSN: [id number] Arrival date & time: 12/30/20  1123     History Chief Complaint  Patient presents with  . Chest Pain    [name] is a 80 y.o. female.  Patient with acute onset of substernal chest pain that radiates to the back 10:00 this morning.  Lasted for over 20 minutes patient took a sublingual nitroglycerin things started to improve the pain in the back went away.  Substernal chest pain improved but not resolved completely.  Still present.  No nausea no vomiting no shortness of breath.  No radiation of the pain to the arm or neck or jaw.  Patient's past medical history is significant for coronary artery disease hypertension hyperlipidemia.  Last seen by Dr. Harrington Challenger from cardiology in December 2021.  In September 2020 patient had a non-STEMI had a cath that showed severe single-vessel disease left circumflex with had PTCA and DES.  Patient has not really had any chest pain since then.  Patient did take a baby aspirin this morning.        Past Medical History:  Diagnosis Date  . Abnormal thyroid function test 07/01/2013  . Anxiety   . Arthritis   . Cancer ([place])   . Coronary artery disease   . Dermatitis 11/25/2013  . Glaucoma 11/02/2012  . HTN (hypertension) 11/02/2012  . 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 Hyperlipidemia   . Hypothyroid   . Obesity     Patient Active Problem List   Diagnosis Date Noted  . PAD (peripheral artery disease) ([place]) 09/15/2020  . Aortic atherosclerosis ([place]) 09/15/2020  . Statin myopathy 07/01/2019  . Hypokalemia 06/02/2019  . CAD (coronary artery disease) s/p stent mCx 05/2019 06/02/2019  . NSTEMI (non-ST elevated myocardial infarction) ([place]) 05/31/2019  . Chest pain 12/29/2018  . History of adenomatous polyp of colon 09/30/2018  . 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  Postsurgical states following surgery of eye and adnexa 06/11/2018  . Capsular glaucoma of right eye with pseudoexfoliation (PXF) of lens, severe stage 05/07/2018  . Primary open-angle glaucoma 11/20/2017  . Pseudophakia of both eyes 11/20/2017  . Grieving 02/18/2017  . Osteoporosis 11/05/2016  . Medicare annual wellness visit, subsequent 09/26/2014  . Hypothyroidism 07/01/2013  . Hypertension 11/02/2012  . Vitamin D deficiency 11/02/2012  . Hyperlipidemia 07/24/2011  . Obesity (BMI 30-39.9) 07/24/2011  . hx: breast cancer, IDC (Microinvasive) w DCIS, 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 - 07/10/2011    Past Surgical History:  Procedure Laterality Date  . AUGMENTATION MAMMAPLASTY Left    05/2006  . BREAST SURGERY  10/2005   Left Mastectomy, reconstruction, reduction on right. Dr. Harlow Mares  . CARDIAC CATHETERIZATION    . COLONOSCOPY    . CORONARY STENT INTERVENTION N/A 06/01/2019   Procedure: CORONARY STENT INTERVENTION;  Surgeon: Sherren Mocha, MD;  Location: [facility];  Service: Cardiovascular;  Laterality: N/A;  . CRYOTHERAPY     GYN for CIN III  . DILATION AND CURETTAGE OF UTERUS    . EXCISION MORTON'S NEUROMA     right foot  . EYE SURGERY Right 05/08/2018  . LEFT HEART CATH AND CORONARY ANGIOGRAPHY N/A 06/01/2019   Procedure: LEFT HEART CATH AND CORONARY ANGIOGRAPHY;  Surgeon: Sherren Mocha, MD;  Location: [facility];  Service: Cardiovascular;  Laterality: N/A;  . MASTECTOMY    . REDUCTION MAMMAPLASTY Right  OB History   No obstetric history on file.     Family History  Problem Relation Age of Onset  . Pneumonia Mother   . Hyperlipidemia Mother   . Parkinsonism Mother   . CAD Mother   . Other Father        bilateral subdural hematoma  . Colon cancer Paternal Grandmother   . Hyperlipidemia Sister   . Prostate cancer Neg Hx   . Breast cancer Neg Hx   . Heart disease Neg Hx   . Diabetes Neg Hx     Social History   Tobacco Use  . Smoking status: Never  Smoker  . Smokeless tobacco: Never Used  Vaping Use  . Vaping Use: Never used  Substance Use Topics  . Alcohol use: Yes    Comment: Rare  . Drug use: No    Home Medications Prior to Admission medications   Medication Sig Start Date End Date Taking? Authorizing Provider  AMBULATORY NON FORMULARY MEDICATION Take 180 mg by mouth daily. Medication Name: 180 mg bempedoic acid vs a placebo, CLEAR Research Study drug provided 11/01/16   Lelon Perla, MD  amLODipine (NORVASC) 5 MG tablet Take 1 tablet (5 mg total) by mouth daily. 10/03/20   Marin Olp, MD  aspirin EC 81 MG tablet Take 81 mg by mouth daily.    [provider]  Bimatoprost (LUMIGAN OP) Place 1 drop into both eyes at bedtime.     [provider]  chlorthalidone (HYGROTON) 25 MG tablet TAKE 1 TABLET ONCE DAILY. 10/21/20   Marin Olp, MD  Cholecalciferol (VITAMIN D-3) 5000 UNITS TABS Take 5,000 mg by mouth daily.    [provider]  dorzolamide-timolol (COSOPT) 22.3-6.8 MG/ML ophthalmic solution Place 1 drop into both eyes 2 (two) times daily.     [provider]  famotidine (PEPCID) 20 MG tablet Take 1 tablet (20 mg total) by mouth 2 (two) times daily. 05/12/20   Marin Olp, MD  icosapent Ethyl (VASCEPA) 1 g capsule Take 1 capsule (1 g total) by mouth 2 (two) times daily. 08/19/20   Fay Records, MD  levothyroxine (SYNTHROID) 50 MCG tablet TAKE 1 TABLET BEFORE BREAKFAST. 12/19/20   Marin Olp, MD  metoprolol tartrate (LOPRESSOR) 25 MG tablet TAKE 1 TABLET BY MOUTH TWICE DAILY. 11/21/20   Marin Olp, MD  nitroGLYCERIN (NITROSTAT) 0.4 MG SL tablet Place 1 tablet (0.4 mg total) under the tongue every 5 (five) minutes x 3 doses as needed for chest pain. 06/02/19   Evans Lance, MD  pilocarpine (PILOCAR) 1 % ophthalmic solution Place 1 drop into both eyes 2 (two) times daily.     [provider]  Polyethyl Glycol-Propyl Glycol (SYSTANE OP) Place 1-2 drops into  both eyes as needed (for dry eyes).     [provider]  potassium chloride SA (KLOR-CON) 20 MEQ tablet Take 1 tablet (20 mEq total) by mouth daily. 10/03/20   Marin Olp, MD  Probiotic Product (PROBIOTIC DAILY) CAPS Take 1 capsule by mouth daily. Reported on 10/10/2015    [provider]  REPATHA SURECLICK 948 MG/ML SOAJ INJECT CONTENTS OF 1 PEN (140MG ) INTO SKIN EVERY 14 DAYS. 10/21/20   Fay Records, MD  ticagrelor (BRILINTA) 90 MG TABS tablet Take 1 tablet (90 mg total) by mouth 2 (two) times daily. 06/28/20   Fay Records, MD    Allergies    Adhesive [tape] and Rosuvastatin  Review of Systems  Review of Systems  Constitutional: Negative for chills and fever.  HENT: Negative for congestion, rhinorrhea and sore throat.   Eyes: Negative for visual disturbance.  Respiratory: Negative for cough and shortness of breath.   Cardiovascular: Positive for chest pain. Negative for leg swelling.  Gastrointestinal: Negative for abdominal pain, diarrhea, nausea and vomiting.  Genitourinary: Negative for dysuria.  Musculoskeletal: Negative for back pain and neck pain.  Skin: Negative for rash.  Neurological: Negative for dizziness, light-headedness and headaches.  Hematological: Does not bruise/bleed easily.  Psychiatric/Behavioral: Negative for confusion.    Physical Exam Updated Vital Signs BP 117/68   Pulse (!) 56   Temp 97.7 F (36.5 C) (Oral)   Resp (!) 21   Ht 1.575 m (5\' 2" )   Wt 68 kg   SpO2 98%   BMI 27.44 kg/m   Physical Exam Vitals and nursing note reviewed.  Constitutional:      General: She is not in acute distress.    Appearance: Normal appearance. She is well-developed.  HENT:     Head: Normocephalic and atraumatic.  Eyes:     Extraocular Movements: Extraocular movements intact.     Conjunctiva/sclera: Conjunctivae normal.     Pupils: Pupils are equal, round, and reactive to light.  Cardiovascular:     Rate and Rhythm: Normal rate and  regular rhythm.     Heart sounds: No murmur heard.   Pulmonary:     Effort: Pulmonary effort is normal. No respiratory distress.     Breath sounds: Normal breath sounds.  Chest:     Chest wall: No tenderness.  Abdominal:     Palpations: Abdomen is soft.     Tenderness: There is no abdominal tenderness.  Musculoskeletal:        General: No swelling.     Cervical back: Neck supple.  Skin:    General: Skin is warm and dry.     Capillary Refill: Capillary refill takes less than 2 seconds.  Neurological:     General: No focal deficit present.     Mental Status: She is alert and oriented to person, place, and time.     Cranial Nerves: No cranial nerve deficit.     Sensory: No sensory deficit.     Motor: No weakness.     ED Results / Procedures / Treatments   Labs (all labs ordered are listed, but only abnormal results are displayed) Labs Reviewed  COMPREHENSIVE METABOLIC PANEL - Abnormal; Notable for the following components:      Result Value   Glucose, Bld 125 (*)    All other components within normal limits  RESP PANEL BY RT-PCR (FLU A&B, COVID) ARPGX2  CBC WITH DIFFERENTIAL/PLATELET  TROPONIN I (HIGH SENSITIVITY)  TROPONIN I (HIGH SENSITIVITY)    EKG EKG Interpretation  Date/Time:  Friday December 30 2020 11:32:00 EDT Ventricular Rate:  77 PR Interval:  176 QRS Duration: 96 QT Interval:  368 QTC Calculation: 417 R Axis:   38 Text Interpretation: Sinus rhythm No significant change since last tracing Confirmed by Fredia Sorrow [phone]) on 12/30/2020 11:40:51 AM   Radiology DG Chest Port 1 View  Result Date: 12/30/2020 CLINICAL DATA:  Chest pain. EXAM: PORTABLE CHEST 1 VIEW COMPARISON:  March 16, 2020. FINDINGS: Accentuation of the cardiac and mediastinal silhouette by AP portable technique, low lung volumes, and patient rotation. Similar mild chronic interstitial prominence. Calcific atherosclerosis of the aorta. No consolidation. No visible pleural effusions or  pneumothorax. No acute osseous abnormality. IMPRESSION: 1. No  convincing evidence of acute cardiopulmonary disease. 2. Accentuation of the cardiac and mediastinal silhouette by AP portable technique, low lung volumes, and patient rotation. Dedicated PA and lateral radiographs could provide more accurate evaluation if clinically indicated. Electronically Signed   By: Margaretha Sheffield MD   On: 12/30/2020 12:42    Procedures Procedures  Medications Ordered in ED Medications  nitroGLYCERIN (NITROSTAT) SL tablet 0.4 mg (0.4 mg Sublingual Given 12/30/20 1229)  aspirin chewable tablet 243 mg (243 mg Oral Given 12/30/20 1215)    ED Course  I have reviewed the triage vital signs and the nursing notes.  Pertinent labs & imaging results that were available during my care of the patient were reviewed by me and considered in my medical decision making (see chart for details).    MDM Rules/Calculators/A&P                          Patient with acute onset of substernal chest pain initially radiates to the back.  Now just substernal.  Has persistent since 10:00 this morning.  Patient given additional nitroglycerin here with some improvement but not completely relieved.  Now more just a discomfort feeling.  Patient also given 3 baby aspirin.  Dust x-ray without any acute findings.  EKG without any acute findings.  Initial troponin was very normal.  Delta troponins pending.  Patient's labs without significant abnormality.  Discussed with Dr. Dorris Carnes cardiology.  She reviewed everything.  And felt that if second troponin was very normal patient could be discharged home with follow-up with cardiology.  Patient does have nitroglycerin at home.  There is a significant change in the troponin will call cardiology back.  Or if patient's symptoms worsen.  Will give patient a Pepcid here. Second troponin very normal.  Patient without any increase in the pain.  Now just some discomfort.  Stable for discharge  home.   Final Clinical Impression(s) / ED Diagnoses Final diagnoses:  Precordial pain    Rx / DC Orders ED Discharge Orders    None       Fredia Sorrow, MD 12/30/20 1439    Fredia Sorrow, MD 12/30/20 [phone]

## 2020-12-30 NOTE — Discharge Instructions (Addendum)
Make an appointment to follow-up with Dr. Harrington Challenger.  Take the Pepcid.  Return for any new or worse symptoms.  Second troponin was normal.  If you get new or worse chest pain you need to get seen again.

## 2021-01-12 ENCOUNTER — Telehealth: Payer: Self-pay | Admitting: Internal Medicine

## 2021-01-12 NOTE — Telephone Encounter (Signed)
Returned call to patient who gave permission for me to speak with her friend, Beverlee Nims. I spoke with both the patient and Beverlee Nims via speaker phone. Patient states she was sweeping her patio at about 11:30 am when she felt short of breath and dizzy. She went inside and took 1 ntg and felt better. She currently does not have any symptoms. States when these symptoms occurred, she was sweeping her patio but had not been outside for very long and was not sweating. Denies chest discomfort, edema, palpitations. Took medications this morning, ate a sausage biscuit from [employer], and then went outside to work. Admits to not drinking enough water regularly. She is compliant with cardiac medications.  Feels a little "washed out" currently and also has been coughing over past few days. She has a friend who tested positive for Covid today and she was recently exposed to her. Current BP is 130/75, pulse 76. I advised her to seek out testing for Covid and to contact her PCP. She went to ED on 4/22 for similar symptoms of elevated BP and chest discomfort. Cardiac tests were wnl and she was advised to follow-up with Dr. Harrington Challenger. She has an appointment on 5/10. I advised that if symptoms of SOB worsen to call back or to return to ED. She verbalized understanding and agreement with plan and thanked me for the call.

## 2021-01-12 NOTE — Telephone Encounter (Signed)
Patient's friend, Beverlee Nims, called in stating the patient had an episode today when sweeping her patio. She states she became dizzy and SOB.  STAT if patient feels like he/she is going to faint   1) Are you dizzy now?  No   2) Do you feel faint or have you passed out?  No   3) Do you have any other symptoms?  SOB  4) Have you checked your HR and BP (record if available)?  Per patient's friend, Beverlee Nims:  173/93 132/60 (after taking nitro)   Pt c/o Shortness Of Breath: STAT if SOB developed within the last 24 hours or pt is noticeably SOB on the phone  1. Are you currently SOB (can you hear that pt is SOB on the phone)?  No   2. How long have you been experiencing SOB?  Past few days  3. Are you SOB when sitting or when up moving around?  When up and moving around   4. Are you currently experiencing any other symptoms? No

## 2021-01-15 NOTE — Progress Notes (Signed)
Cardiology Office Note   Date:  01/17/2021   ID:  Quinita, Kostelecky 1941-01-05, MRN [id number]  PCP:  Marin Olp, MD  Cardiologist:   Dorris Carnes, MD   F/U of CAD    History of Present Illness: [name] is a 80 y.o. female with a history of HTN, HL and  CAD  She was admitted in April 2020 with CP   CT chest/abd showed extensive atherosclerosis.  Cardac CT angiogram showed no obstructive lesons    In September2020 she was admtted with an NSTEMI.  Cardiac catheterization showed severe single-vessel disease with a critical stenosis of the left circumflex.  She underwent PTCA/DES to this area.  There was nonobstructive disease noted in the LAD.  Plan for dual antiplatelet therapy for 12 months.  The patient was enrolled in the CLEAR trial since 2018  She was seen  by [facility] and Praluent was added along with Vascepa.  She discontinued DHEA and krill oil.    I saw the pt in clinic in Dec 2021  The pt went to ED on 4/22 with chest pressure   Mild    Didn't know if it was angina from her heart or if it was  Reflux.   Pt evaluated an sent home    SInce ER visit has had no more chest pressuer   ON 5/5 had some SOB on patio  Didn't feel right   Went in    Took NTG   Eased off   Felt washed out     Usually goes to the gym    Uses [employer] and treadmill     Last time went was last week   Has not been back to work out since 01/12/21 (when she  felt SOB)   Current Meds  Medication Sig  . AMBULATORY NON FORMULARY MEDICATION Take 180 mg by mouth daily. Medication Name: 180 mg bempedoic acid vs a placebo, CLEAR Research Study drug provided  . amLODipine (NORVASC) 5 MG tablet Take 1 tablet (5 mg total) by mouth daily.  Marland Kitchen aspirin EC 81 MG tablet Take 81 mg by mouth daily.  . Bimatoprost (LUMIGAN OP) Place 1 drop into both eyes at bedtime.   . chlorthalidone (HYGROTON) 25 MG tablet TAKE 1 TABLET ONCE DAILY.  Marland Kitchen Cholecalciferol (VITAMIN D-3) 5000 UNITS TABS Take 5,000 mg by mouth  daily.  . dorzolamide-timolol (COSOPT) 22.3-6.8 MG/ML ophthalmic solution Place 1 drop into both eyes 2 (two) times daily.   . famotidine (PEPCID) 20 MG tablet Take 1 tablet (20 mg total) by mouth 2 (two) times daily.  Marland Kitchen icosapent Ethyl (VASCEPA) 1 g capsule Take 1 capsule (1 g total) by mouth 2 (two) times daily.  Marland Kitchen levothyroxine (SYNTHROID) 50 MCG tablet TAKE 1 TABLET BEFORE BREAKFAST.  . metoprolol tartrate (LOPRESSOR) 25 MG tablet TAKE 1 TABLET BY MOUTH TWICE DAILY.  Marland Kitchen pilocarpine (PILOCAR) 1 % ophthalmic solution Place 1 drop into both eyes 2 (two) times daily.   Vladimir Faster Glycol-Propyl Glycol (SYSTANE OP) Place 1-2 drops into both eyes as needed (for dry eyes).   . potassium chloride SA (KLOR-CON) 20 MEQ tablet Take 1 tablet (20 mEq total) by mouth daily.  . Probiotic Product (PROBIOTIC DAILY) CAPS Take 1 capsule by mouth daily. Reported on 10/10/2015  . REPATHA SURECLICK 784 MG/ML SOAJ INJECT CONTENTS OF 1 PEN (140MG ) INTO SKIN EVERY 14 DAYS.  Marland Kitchen ticagrelor (BRILINTA) 90 MG TABS tablet Take 1 tablet (90 mg total) by  mouth 2 (two) times daily.  . [DISCONTINUED] nitroGLYCERIN (NITROSTAT) 0.4 MG SL tablet Place 1 tablet (0.4 mg total) under the tongue every 5 (five) minutes x 3 doses as needed for chest pain.     Allergies:   Adhesive [tape] and Rosuvastatin   Past Medical History:  Diagnosis Date  . Abnormal thyroid function test 07/01/2013  . Anxiety   . Arthritis   . Cancer ([place])   . Coronary artery disease   . Dermatitis 11/25/2013  . Glaucoma 11/02/2012  . HTN (hypertension) 11/02/2012  . 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 Hyperlipidemia   . Hypothyroid   . Obesity     Past Surgical History:  Procedure Laterality Date  . AUGMENTATION MAMMAPLASTY Left    05/2006  . BREAST SURGERY  10/2005   Left Mastectomy, reconstruction, reduction on right. Dr. Harlow Mares  . CARDIAC CATHETERIZATION    . COLONOSCOPY    . CORONARY STENT  INTERVENTION N/A 06/01/2019   Procedure: CORONARY STENT INTERVENTION;  Surgeon: Sherren Mocha, MD;  Location: [facility];  Service: Cardiovascular;  Laterality: N/A;  . CRYOTHERAPY     GYN for CIN III  . DILATION AND CURETTAGE OF UTERUS    . EXCISION MORTON'S NEUROMA     right foot  . EYE SURGERY Right 05/08/2018  . LEFT HEART CATH AND CORONARY ANGIOGRAPHY N/A 06/01/2019   Procedure: LEFT HEART CATH AND CORONARY ANGIOGRAPHY;  Surgeon: Sherren Mocha, MD;  Location: [facility];  Service: Cardiovascular;  Laterality: N/A;  . MASTECTOMY    . REDUCTION MAMMAPLASTY Right      Social History:  The patient  reports that she has never smoked. She has never used smokeless tobacco. She reports current alcohol use. She reports that she does not use drugs.   Family History:  The patient's family history includes CAD in her mother; Colon cancer in her paternal grandmother; Hyperlipidemia in her mother and sister; Other in her father; Parkinsonism in her mother; Pneumonia in her mother.    ROS:  Please see the history of present illness. All other systems are reviewed and  Negative to the above problem except as noted.    PHYSICAL EXAM: VS:  BP 126/64   Pulse 76   Ht 5\' 3"  (1.6 m)   Wt 149 lb 12.8 oz (67.9 kg)   SpO2 97%   BMI 26.54 kg/m   GEN: Pt is a 80 yo female in no acute distress  HEENT: normal  Neck: no JVD  No bruits Cardiac: RRR; no murmurs No LE edema  Respiratory:  CTA  GI: soft, nontender, nondistended, + BS   Skin: warm and dry, no rash Neuro:  Strength and sensation are intact Psych: euthymic mood, full affect   EKG:  EKG is not ordered today.   Lipid Panel    Component Value Date/Time   CHOL 131 10/27/2020 0936   TRIG 65 10/27/2020 0936   HDL 64 10/27/2020 0936   CHOLHDL 2.0 10/27/2020 0936   CHOLHDL 1.7 05/12/2020 1136   VLDL 21.8 09/02/2019 0828   LDLCALC 53 10/27/2020 0936   LDLCALC 30 05/12/2020 1136   LDLDIRECT 114.0 04/29/2019 1102       Wt Readings from Last 3 Encounters:  01/17/21 149 lb 12.8 oz (67.9 kg)  12/30/20 150 lb (68 kg)  11/03/20 149 lb 9.6 oz (67.9 kg)    Cardiac Studies  CORONARY STENT INTERVENTION 05/2019  LEFT HEART  CATH AND CORONARY ANGIOGRAPHY  Conclusion  1) Severe single vessel CAD with critical stenosis of the mid-circumflex, treated successfully with PTCA and stenting (2.5x12 mm Synergy DES 2) nonobstructive LAD stenosis 3) widely patent RCA, dominant vessel 4) normal LV systolic function  Recommend: DAPT with ASA and ticagrelor x 12 months without interruption. Pt loaded with ticagrelor 180 mg on the cath lab table. DC home tomorrow am if no complications arise.      ASSESSMENT AND PLAN:  1  CAD  Pt with recent episode of CP where she went to ED   Last week with episode of SOB that was significant    She is anxious about this which does not help   ANd she is alone  Given all of this I would recomm a Lexiscan myovue to r/o significant ischemia Continue activities as tolerated     2  HTN  BP is  well controlled Keep on current regimen     3  HL   In Feb 2022  LDL 53  HDL 64    4.  PAD atherosclerosis of aorta  5  Depression   Pt is still grieving over loss of husband   Tearful today   ANxous     This is contributing to above I think    She plans to start going back to church   I encouraged this   She has friends there Also is working out at [employer]   I encouraged this as weell  If testing is good then follow up in 6 months   Current medicines are reviewed at length with the patient today.  The patient does not have concerns regarding medicines.  Signed, Dorris Carnes, MD  01/17/2021 [address], [place], [place]  [address] Phone: [phone]; Fax: [phone]

## 2021-01-17 ENCOUNTER — Other Ambulatory Visit (HOSPITAL_COMMUNITY): Payer: Self-pay

## 2021-01-17 ENCOUNTER — Encounter: Payer: Self-pay | Admitting: *Deleted

## 2021-01-17 ENCOUNTER — Ambulatory Visit (INDEPENDENT_AMBULATORY_CARE_PROVIDER_SITE_OTHER): Payer: Medicare PPO | Admitting: Internal Medicine

## 2021-01-17 ENCOUNTER — Other Ambulatory Visit: Payer: Self-pay

## 2021-01-17 ENCOUNTER — Encounter: Payer: Self-pay | Admitting: Internal Medicine

## 2021-01-17 VITALS — BP 126/64 | HR 76 | Ht 63.0 in | Wt 149.8 lb

## 2021-01-17 DIAGNOSIS — R0789 Other chest pain: Secondary | ICD-10-CM

## 2021-01-17 DIAGNOSIS — R079 Chest pain, unspecified: Secondary | ICD-10-CM | POA: Diagnosis not present

## 2021-01-17 DIAGNOSIS — I2510 Atherosclerotic heart disease of native coronary artery without angina pectoris: Secondary | ICD-10-CM

## 2021-01-17 MED ORDER — NITROGLYCERIN 0.4 MG SL SUBL
0.4000 mg | SUBLINGUAL_TABLET | SUBLINGUAL | 12 refills | Status: DC | PRN
  Filled 2021-01-17: qty 25, 8d supply, fill #0

## 2021-01-17 MED ORDER — NITROGLYCERIN 0.4 MG SL SUBL: 0.4000 mg | SUBLINGUAL_TABLET | SUBLINGUAL | 3 refills | Status: DC | PRN

## 2021-01-17 NOTE — Patient Instructions (Addendum)
Health Maintenance Due  Topic Date Due  . TETANUS/TDAP Please get this done at your local pharmacy.  07/17/2020   We will call you within two weeks about your referral to audiology. If you do not hear within 2 weeks, give [place] a call.   Consider calling hospice back and seeing if you can get back into grief counseling  OR Please call [phone] to schedule a visit with [facility]  Recommended follow up: Return in about 3 months (around 04/20/2021) for follow up- or sooner if needed.

## 2021-01-17 NOTE — Patient Instructions (Signed)
Medication Instructions:  No changes *If you need a refill on your cardiac medications before your next appointment, please call your pharmacy*  Lab Work: none  Testing/Procedures: Your physician has requested that you have a lexiscan myoview. For further information please visit [email or web address]. Please follow instruction sheet, as given.   Follow-Up: At [facility], you and your health needs are our priority.  As part of our continuing mission to provide you with exceptional heart care, we have created designated Provider Care Teams.  These Care Teams include your primary Cardiologist (physician) and Advanced Practice Providers (APPs -  Physician Assistants and Nurse Practitioners) who all work together to provide you with the care you need, when you need it.  Your next appointment:   6 month(s)  The format for your next appointment:   In Person  Provider:   You may see Dorris Carnes, MD or one of the following Advanced Practice Providers on your designated Care Team:    Richardson Dopp, PA-C  [place], [place]

## 2021-01-18 ENCOUNTER — Ambulatory Visit: Payer: Medicare PPO | Admitting: Family Medicine

## 2021-01-18 ENCOUNTER — Encounter: Payer: Self-pay | Admitting: Family Medicine

## 2021-01-18 VITALS — BP 122/64 | HR 70 | Temp 97.9°F | Ht 62.0 in | Wt 149.8 lb

## 2021-01-18 DIAGNOSIS — E782 Mixed hyperlipidemia: Secondary | ICD-10-CM | POA: Diagnosis not present

## 2021-01-18 DIAGNOSIS — I10 Essential (primary) hypertension: Secondary | ICD-10-CM | POA: Diagnosis not present

## 2021-01-18 DIAGNOSIS — I2510 Atherosclerotic heart disease of native coronary artery without angina pectoris: Secondary | ICD-10-CM | POA: Diagnosis not present

## 2021-01-18 DIAGNOSIS — E039 Hypothyroidism, unspecified: Secondary | ICD-10-CM

## 2021-01-18 DIAGNOSIS — E559 Vitamin D deficiency, unspecified: Secondary | ICD-10-CM | POA: Diagnosis not present

## 2021-01-18 DIAGNOSIS — H9190 Unspecified hearing loss, unspecified ear: Secondary | ICD-10-CM

## 2021-01-18 DIAGNOSIS — E876 Hypokalemia: Secondary | ICD-10-CM

## 2021-01-18 NOTE — Progress Notes (Signed)
Phone [phone] In person visit   Subjective:   [name] is a 80 y.o. year old very pleasant female patient who presents for/with See problem oriented charting Chief Complaint  Patient presents with  . Hypertension  . Hyperlipidemia   This visit occurred during the SARS-CoV-2 public health emergency.  Safety protocols were in place, including screening questions prior to the visit, additional usage of staff PPE, and extensive cleaning of exam room while observing appropriate contact time as indicated for disinfecting solutions.   Past Medical History-  Patient Active Problem List   Diagnosis Date Noted  . CAD (coronary artery disease) s/p stent mCx 05/2019 06/02/2019    Priority: High  . Grieving 02/18/2017    Priority: High  . Osteoporosis 11/05/2016    Priority: High  . Aortic atherosclerosis ([place]) 09/15/2020    Priority: Medium  . Chest pain 12/29/2018    Priority: Medium  . Primary open-angle glaucoma 11/20/2017    Priority: Medium  . Hypothyroidism 07/01/2013    Priority: Medium  . Hypertension 11/02/2012    Priority: Medium  . Hyperlipidemia 07/24/2011    Priority: Medium  . 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 History of adenomatous polyp of colon 09/30/2018    Priority: Low  . Medicare annual wellness visit, subsequent 09/26/2014    Priority: Low  . Vitamin D deficiency 11/02/2012    Priority: Low  . Obesity (BMI 30-39.9) 07/24/2011    Priority: Low  . PAD (peripheral artery disease) ([place]) 09/15/2020  . Statin myopathy 07/01/2019  . Hypokalemia 06/02/2019  . NSTEMI (non-ST elevated myocardial infarction) ([place]) 05/31/2019  . 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 Postsurgical states following surgery of eye and adnexa 06/11/2018  . Capsular glaucoma of right eye with pseudoexfoliation (PXF) of lens, severe stage 05/07/2018  .  Pseudophakia of both eyes 11/20/2017    Medications- reviewed and updated Current Outpatient Medications  Medication Sig Dispense Refill  . AMBULATORY NON FORMULARY MEDICATION Take 180 mg by mouth daily. Medication Name: 180 mg bempedoic acid vs a placebo, CLEAR Research Study drug provided    . amLODipine (NORVASC) 5 MG tablet Take 1 tablet (5 mg total) by mouth daily. 90 tablet 3  . aspirin EC 81 MG tablet Take 81 mg by mouth daily.    . Bimatoprost (LUMIGAN OP) Place 1 drop into both eyes at bedtime.     . chlorthalidone (HYGROTON) 25 MG tablet TAKE 1 TABLET ONCE DAILY. 90 tablet 0  . Cholecalciferol (VITAMIN D-3) 5000 UNITS TABS Take 5,000 mg by mouth daily.    . dorzolamide-timolol (COSOPT) 22.3-6.8 MG/ML ophthalmic solution Place 1 drop into both eyes 2 (two) times daily.     . famotidine (PEPCID) 20 MG tablet Take 1 tablet (20 mg total) by mouth 2 (two) times daily. 30 tablet 0  . icosapent Ethyl (VASCEPA) 1 g capsule Take 1 capsule (1 g total) by mouth 2 (two) times daily. 60 capsule 11  . levothyroxine (SYNTHROID) 50 MCG tablet TAKE 1 TABLET BEFORE BREAKFAST. 90 tablet 0  . metoprolol tartrate (LOPRESSOR) 25 MG tablet TAKE 1 TABLET BY MOUTH TWICE DAILY. 180 tablet 0  . nitroGLYCERIN (NITROSTAT) 0.4 MG SL tablet Place 1 tablet (0.4 mg total) under the tongue every 5 (five) minutes x 3 doses as needed for chest pain. 25 tablet 3  . pilocarpine (PILOCAR) 1 %  ophthalmic solution Place 1 drop into both eyes 2 (two) times daily.     Vladimir Faster Glycol-Propyl Glycol (SYSTANE OP) Place 1-2 drops into both eyes as needed (for dry eyes).     . potassium chloride SA (KLOR-CON) 20 MEQ tablet Take 1 tablet (20 mEq total) by mouth daily. 90 tablet 3  . Probiotic Product (PROBIOTIC DAILY) CAPS Take 1 capsule by mouth daily. Reported on 10/10/2015    . REPATHA SURECLICK 623 MG/ML SOAJ INJECT CONTENTS OF 1 PEN (140MG ) INTO SKIN EVERY 14 DAYS. 2 mL 11  . ticagrelor (BRILINTA) 90 MG TABS tablet Take 1  tablet (90 mg total) by mouth 2 (two) times daily. 60 tablet 11   No current facility-administered medications for this visit.     Objective:  BP 122/64   Pulse 70   Temp 97.9 F (36.6 C) (Temporal)   Ht 5\' 2"  (1.575 m)   Wt 149 lb 12.8 oz (67.9 kg)   SpO2 97%   BMI 27.40 kg/m  Gen: NAD, resting comfortably CV: RRR no murmurs rubs or gallops Lungs: CTAB no crackles, wheeze, rhonchi Abdomen: soft/nontender/nondistended/normal bowel sounds. No rebound or guarding.  Ext: no edema     Assessment and Plan   #CAD-follows with Dr. Harrington Challenger- Recently seen on 4/24 22 for chest pain are troponin levels were reassuring- After she saw Dr. Harrington Challenger cardiology, she plans on updating stress test  #PAD listed by Dr. Mable Fill does have aortic atherosclerosis #hyperlipidemia S: Medication: Repeatha 140 mg every 2 weeks and Vascepa 1 g-also on clear study-She will find out next week if she will be on the trail drug or not and she will let us know, brilinta 90 mg  A/P: lipids Excellent control on most recent check-continue current medication- if she is on trial drug may continue   For CAD- continue current meds- has upcoming stress test  #hypertension S: medication: Metoprolol 25 mg twice daily, amlodipine 5 mg, chlorthalidone 25 mg with potassium every other day Home readings #s: mainly in the 120-130s range-occasionally in the 110-119 range. Did have an elevated blood pressure before emergency visit  Patient continues to work with nutritionist every few weeks and is a member at [employer] well for exercise.  A/P: Stable. Continue current medications.   #hypothyroidism S: compliant On thyroid medication-levothyroxine 50 mcg Lab Results  Component Value Date   TSH 2.36 09/15/2020  A/P:Stable. Continue current medications.     #Vitamin D deficiency S: Medication: 5000 units daily  Last vitamin D Lab Results  Component Value Date   VD25OH 52 05/12/2020  A/P: well controlled on last check -  continue current meds   #Osteoporosis- patient has not tolerated Prolia due to achiness and has had a lot of dental issues so not thought ideal candidate for bisphosphonates   Recommended follow up: Return in about 3 months (around 04/20/2021) for follow up- or sooner if needed. Future Appointments  Date Time Provider [place]  01/23/2021  9:00 AM [place] [facility] None  01/26/2021  7:45 AM [facility] NM[facility]/TREAD [id number]  04/20/2021 11:00 AM Marin Olp, MD [facility]  06/01/2021  8:30 AM [facility] HEALTH COACH [facility]    Lab/Order associations:   ICD-10-CM   1. Primary hypertension  I10   2. Coronary artery disease involving native coronary artery of native heart without angina pectoris  I25.10   3. Hypothyroidism, unspecified type  E03.9   4. Vitamin D deficiency  E55.9   5.  Hypokalemia  E87.6   6. Mixed hyperlipidemia  E78.2   7. Hearing loss, unspecified hearing loss type, unspecified laterality  H91.90 Ambulatory referral to Audiology   I,Alexis Bryant,acting as a scribe for Garret Reddish, MD.,have documented all relevant documentation on the behalf of Garret Reddish, MD,as directed by  Garret Reddish, MD while in the presence of Garret Reddish, MD.   I, Garret Reddish, MD, have reviewed all documentation for this visit. The documentation on 01/18/21 for the exam, diagnosis, procedures, and orders are all accurate and complete.  Return precautions advised.  Garret Reddish, MD

## 2021-01-23 ENCOUNTER — Encounter: Payer: Medicare PPO | Admitting: *Deleted

## 2021-01-23 ENCOUNTER — Other Ambulatory Visit: Payer: Self-pay

## 2021-01-23 DIAGNOSIS — Z006 Encounter for examination for normal comparison and control in clinical research program: Secondary | ICD-10-CM

## 2021-01-23 NOTE — Research (Signed)
Subject came into the research clinic today for their End of Study visit in the CLEAR research trial. Subject's concomitant medications were reviewed and updated if applicable. There are no new AE's or SAE's to report to sponsor at this time. Subject was 100% compliant with IP since last visit. Subject did have an EKG done and a physical assessment was performed by the Sub-I of the study. Subject was reminded that they will be receiving a phone call from me in approximately 20-40 days from today. We will be letting the subject's PCP and cardiologist know that the study has ended.

## 2021-01-24 ENCOUNTER — Telehealth (HOSPITAL_COMMUNITY): Payer: Self-pay

## 2021-01-24 ENCOUNTER — Other Ambulatory Visit: Payer: Self-pay | Admitting: Family Medicine

## 2021-01-24 NOTE — Telephone Encounter (Signed)
Spoke with the patient, detailed instructions given. She stated that she would be here for her test. Asked to call back with any questions. S.Linell Shawn EMTP 

## 2021-01-26 ENCOUNTER — Other Ambulatory Visit: Payer: Self-pay

## 2021-01-26 ENCOUNTER — Ambulatory Visit (HOSPITAL_COMMUNITY): Payer: Medicare PPO | Attending: Cardiology

## 2021-01-26 DIAGNOSIS — R079 Chest pain, unspecified: Secondary | ICD-10-CM | POA: Insufficient documentation

## 2021-01-26 DIAGNOSIS — I2510 Atherosclerotic heart disease of native coronary artery without angina pectoris: Secondary | ICD-10-CM | POA: Diagnosis not present

## 2021-01-26 LAB — MYOCARDIAL PERFUSION IMAGING
LV dias vol: 48 mL (ref 46–106)
LV sys vol: 12 mL
Peak HR: 94 {beats}/min
Rest HR: 70 {beats}/min
SDS: 1
SRS: 0
SSS: 1
TID: 1.01

## 2021-01-26 MED ORDER — REGADENOSON 0.4 MG/5ML IV SOLN
0.4000 mg | Freq: Once | INTRAVENOUS | Status: AC
  Administered 2021-01-26: 0.4 mg via INTRAVENOUS

## 2021-01-26 MED ORDER — TECHNETIUM TC 99M TETROFOSMIN IV KIT
31.1000 | PACK | Freq: Once | INTRAVENOUS | Status: AC | PRN
  Administered 2021-01-26: 31.1 via INTRAVENOUS
  Filled 2021-01-26: qty 32

## 2021-01-26 MED ORDER — TECHNETIUM TC 99M TETROFOSMIN IV KIT
10.1000 | PACK | Freq: Once | INTRAVENOUS | Status: AC | PRN
Start: 2021-01-26 — End: 2021-01-26
  Administered 2021-01-26: 10.1 via INTRAVENOUS
  Filled 2021-01-26: qty 11

## 2021-02-19 ENCOUNTER — Other Ambulatory Visit: Payer: Self-pay | Admitting: Family Medicine

## 2021-02-21 ENCOUNTER — Telehealth: Payer: Self-pay | Admitting: *Deleted

## 2021-02-21 DIAGNOSIS — Z006 Encounter for examination for normal comparison and control in clinical research program: Secondary | ICD-10-CM

## 2021-02-21 NOTE — Telephone Encounter (Signed)
I called patient for 30-day post study Clear phone call. I left message for patient to call me back.

## 2021-02-22 DIAGNOSIS — H401422 Capsular glaucoma with pseudoexfoliation of lens, left eye, moderate stage: Secondary | ICD-10-CM | POA: Diagnosis not present

## 2021-02-22 DIAGNOSIS — H401413 Capsular glaucoma with pseudoexfoliation of lens, right eye, severe stage: Secondary | ICD-10-CM | POA: Diagnosis not present

## 2021-02-22 DIAGNOSIS — Z961 Presence of intraocular lens: Secondary | ICD-10-CM | POA: Diagnosis not present

## 2021-02-24 ENCOUNTER — Other Ambulatory Visit: Payer: Self-pay

## 2021-02-24 ENCOUNTER — Encounter: Payer: Self-pay | Admitting: Family

## 2021-02-24 ENCOUNTER — Ambulatory Visit: Payer: Medicare PPO | Admitting: Family

## 2021-02-24 ENCOUNTER — Ambulatory Visit (INDEPENDENT_AMBULATORY_CARE_PROVIDER_SITE_OTHER): Payer: Medicare PPO

## 2021-02-24 VITALS — BP 169/83 | HR 98 | Temp 98.3°F | Ht 63.0 in | Wt 148.2 lb

## 2021-02-24 DIAGNOSIS — K5909 Other constipation: Secondary | ICD-10-CM

## 2021-02-24 DIAGNOSIS — K5900 Constipation, unspecified: Secondary | ICD-10-CM | POA: Diagnosis not present

## 2021-02-24 DIAGNOSIS — F4321 Adjustment disorder with depressed mood: Secondary | ICD-10-CM | POA: Diagnosis not present

## 2021-02-24 NOTE — Progress Notes (Signed)
Acute Office Visit  Subjective:    Patient ID: [name], female    DOB: May 16, 1941, 80 y.o.   MRN: [id number]  Chief Complaint  Patient presents with  . Constipation    Going for one week     HPI Patient is in today with complaints of constipation ongoing x1 week.  Patient reports that she has been taking stool softeners and is producing bowel movements but does not feel completely empty.  Patient reports that she is still grieving the death of her husband who normally would take care of her when she is ill.  She is very tearful today.  Denies any abdominal pain, back pain.  Does report seeing bright red blood when she wipes.  No gross blood.  No black stools.  She had a colonoscopy 2 years ago that was normal.  Past Medical History:  Diagnosis Date  . Abnormal thyroid function test 07/01/2013  . Anxiety   . Arthritis   . Cancer ([place])   . Coronary artery disease   . Dermatitis 11/25/2013  . Glaucoma 11/02/2012  . HTN (hypertension) 11/02/2012  . 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 Hyperlipidemia   . Hypothyroid   . Obesity     Past Surgical History:  Procedure Laterality Date  . AUGMENTATION MAMMAPLASTY Left    05/2006  . BREAST SURGERY  10/2005   Left Mastectomy, reconstruction, reduction on right. Dr. Harlow Mares  . CARDIAC CATHETERIZATION    . COLONOSCOPY    . CORONARY STENT INTERVENTION N/A 06/01/2019   Procedure: CORONARY STENT INTERVENTION;  Surgeon: Sherren Mocha, MD;  Location: [facility];  Service: Cardiovascular;  Laterality: N/A;  . CRYOTHERAPY     GYN for CIN III  . DILATION AND CURETTAGE OF UTERUS    . EXCISION MORTON'S NEUROMA     right foot  . EYE SURGERY Right 05/08/2018  . LEFT HEART CATH AND CORONARY ANGIOGRAPHY N/A 06/01/2019   Procedure: LEFT HEART CATH AND CORONARY ANGIOGRAPHY;  Surgeon: Sherren Mocha, MD;  Location: [place] [facility];  Service: Cardiovascular;  Laterality: N/A;  .  MASTECTOMY    . REDUCTION MAMMAPLASTY Right     Family History  Problem Relation Age of Onset  . Pneumonia Mother   . Hyperlipidemia Mother   . Parkinsonism Mother   . CAD Mother   . Other Father        bilateral subdural hematoma  . Colon cancer Paternal Grandmother   . Hyperlipidemia Sister   . Prostate cancer Neg Hx   . Breast cancer Neg Hx   . Heart disease Neg Hx   . Diabetes Neg Hx     Social History   Socioeconomic History  . Marital status: Widowed    Spouse name: Not on file  . Number of children: 1  . Years of education: Not on file  . Highest education level: Not on file  Occupational History  . Occupation: retired  Tobacco Use  . Smoking status: Never  . Smokeless tobacco: Never  Vaping Use  . Vaping Use: Never used  Substance and Sexual Activity  . Alcohol use: Yes    Comment: Rare  . Drug use: No  . Sexual activity: Not on file  Other Topics Concern  . Not on file  Social History Narrative   Married 1981. 1 daughter, 2 step sons. 4 grandchildren      Oncologist  Hobbies: going out to eat, activities with church including cancer support group at church   Social Determinants of Health   Financial Resource Strain: Low Risk   . Difficulty of Paying Living Expenses: Not hard at all  Food Insecurity: No Food Insecurity  . Worried About Charity fundraiser in the Last Year: Never true  . Ran Out of Food in the Last Year: Never true  Transportation Needs: No Transportation Needs  . Lack of Transportation (Medical): No  . Lack of Transportation (Non-Medical): No  Physical Activity: Insufficiently Active  . Days of Exercise per Week: 5 days  . Minutes of Exercise per Session: 20 min  Stress: Stress Concern Present  . Feeling of Stress : To some extent  Social Connections: Moderately Integrated  . Frequency of Communication with Friends and Family: More than three times a week  . Frequency of Social Gatherings with Friends and Family:  Not on file  . Attends Religious Services: More than 4 times per year  . Active Member of Clubs or Organizations: Yes  . Attends Archivist Meetings: Never  . Marital Status: Widowed  Intimate Partner Violence: Not At Risk  . Fear of Current or Ex-Partner: No  . Emotionally Abused: No  . Physically Abused: No  . Sexually Abused: No    Outpatient Medications Prior to Visit  Medication Sig Dispense Refill  . AMBULATORY NON FORMULARY MEDICATION Take 180 mg by mouth daily. Medication Name: 180 mg bempedoic acid vs a placebo, CLEAR Research Study drug provided    . amLODipine (NORVASC) 5 MG tablet Take 1 tablet (5 mg total) by mouth daily. 90 tablet 3  . aspirin EC 81 MG tablet Take 81 mg by mouth daily.    . Bimatoprost (LUMIGAN OP) Place 1 drop into both eyes at bedtime.     . chlorthalidone (HYGROTON) 25 MG tablet TAKE 1 TABLET ONCE DAILY. 90 tablet 0  . Cholecalciferol (VITAMIN D-3) 5000 UNITS TABS Take 5,000 mg by mouth daily.    . dorzolamide-timolol (COSOPT) 22.3-6.8 MG/ML ophthalmic solution Place 1 drop into both eyes 2 (two) times daily.     . famotidine (PEPCID) 20 MG tablet Take 1 tablet (20 mg total) by mouth 2 (two) times daily. 30 tablet 0  . icosapent Ethyl (VASCEPA) 1 g capsule Take 1 capsule (1 g total) by mouth 2 (two) times daily. 60 capsule 11  . levothyroxine (SYNTHROID) 50 MCG tablet TAKE 1 TABLET BEFORE BREAKFAST. 90 tablet 0  . metoprolol tartrate (LOPRESSOR) 25 MG tablet TAKE 1 TABLET BY MOUTH TWICE DAILY. 180 tablet 0  . nitroGLYCERIN (NITROSTAT) 0.4 MG SL tablet Place 1 tablet (0.4 mg total) under the tongue every 5 (five) minutes x 3 doses as needed for chest pain. 25 tablet 3  . pilocarpine (PILOCAR) 1 % ophthalmic solution Place 1 drop into both eyes 2 (two) times daily.     Vladimir Faster Glycol-Propyl Glycol (SYSTANE OP) Place 1-2 drops into both eyes as needed (for dry eyes).     . potassium chloride SA (KLOR-CON) 20 MEQ tablet Take 1 tablet (20 mEq  total) by mouth daily. 90 tablet 3  . Probiotic Product (PROBIOTIC DAILY) CAPS Take 1 capsule by mouth daily. Reported on 10/10/2015    . REPATHA SURECLICK 300 MG/ML SOAJ INJECT CONTENTS OF 1 PEN (140MG ) INTO SKIN EVERY 14 DAYS. 2 mL 11  . ticagrelor (BRILINTA) 90 MG TABS tablet Take 1 tablet (90 mg total) by mouth 2 (two) times  daily. 60 tablet 11   No facility-administered medications prior to visit.    Allergies  Allergen Reactions  . Adhesive [Tape] Other (See Comments)    Causes skin to turn red where touched  . Rosuvastatin Other (See Comments)    Joint & muscle aches, sleep issues.    Review of Systems  Cardiovascular: Negative.   Gastrointestinal:  Positive for anal bleeding and constipation. Negative for diarrhea.  Endocrine: Negative.   Genitourinary: Negative.   Musculoskeletal: Negative.   Skin: Negative.   Allergic/Immunologic: Negative.   Neurological: Negative.   Psychiatric/Behavioral:  Negative for suicidal ideas.        Grieving the death of her husband, 3 years ago  All other systems reviewed and are negative.     Objective:    Physical Exam Vitals and nursing note reviewed.  Constitutional:      Appearance: Normal appearance.  HENT:     Head: Normocephalic and atraumatic.  Cardiovascular:     Rate and Rhythm: Normal rate and regular rhythm.  Pulmonary:     Effort: Pulmonary effort is normal.     Breath sounds: Normal breath sounds.  Abdominal:     General: Abdomen is flat. Bowel sounds are normal. There is no distension.     Palpations: Abdomen is soft.     Tenderness: There is no guarding or rebound.  Genitourinary:    Rectum: Guaiac result positive. No mass, tenderness or external hemorrhoid.  Musculoskeletal:        General: Tenderness present. Normal range of motion.     Cervical back: Normal range of motion and neck supple.  Skin:    General: Skin is warm and dry.  Neurological:     General: No focal deficit present.     Mental Status:  She is alert and oriented to person, place, and time.  Psychiatric:        Mood and Affect: Mood normal.        Behavior: Behavior normal.   BP (!) 169/83   Pulse 98   Temp 98.3 F (36.8 C) (Temporal)   Ht 5\' 3"  (1.6 m)   Wt 148 lb 3.2 oz (67.2 kg)   SpO2 97%   BMI 26.25 kg/m  Wt Readings from Last 3 Encounters:  02/24/21 148 lb 3.2 oz (67.2 kg)  01/26/21 149 lb (67.6 kg)  01/18/21 149 lb 12.8 oz (67.9 kg)    There are no preventive care reminders to display for this patient.  There are no preventive care reminders to display for this patient.   Lab Results  Component Value Date   TSH 2.36 09/15/2020   Lab Results  Component Value Date   WBC 7.8 12/30/2020   HGB 14.2 12/30/2020   HCT 44.0 12/30/2020   MCV 92.8 12/30/2020   PLT 322 12/30/2020   Lab Results  Component Value Date   NA 140 12/30/2020   K 3.6 12/30/2020   CO2 29 12/30/2020   GLUCOSE 125 (H) 12/30/2020   BUN 22 12/30/2020   CREATININE 0.92 12/30/2020   BILITOT 0.9 12/30/2020   ALKPHOS 45 12/30/2020   AST 19 12/30/2020   ALT 16 12/30/2020   PROT 7.5 12/30/2020   ALBUMIN 5.0 12/30/2020   CALCIUM 9.8 12/30/2020   ANIONGAP 11 12/30/2020   GFR 62.62 10/03/2020   Lab Results  Component Value Date   CHOL 131 10/27/2020   Lab Results  Component Value Date   HDL 64 10/27/2020   Lab Results  Component  Value Date   LDLCALC 53 10/27/2020   Lab Results  Component Value Date   TRIG 65 10/27/2020   Lab Results  Component Value Date   CHOLHDL 2.0 10/27/2020   Lab Results  Component Value Date   HGBA1C 5.5 04/21/2019       Assessment & Plan:   Problem List Items Addressed This Visit     Grieving   Other Visit Diagnoses     Other constipation    -  Primary   Relevant Orders   DG Abd 1 View      Plan: Advised MiraLAX 1 scoop twice daily until a bowel movement is achieved.  Drink plenty of fluids.  High-fiber diet.  Consider therapy to help with grief.  Daily walk.  Call the office  with any questions or concerns or if symptoms do not improve.   Kennyth Arnold, FNP

## 2021-02-24 NOTE — Patient Instructions (Signed)

## 2021-02-28 ENCOUNTER — Other Ambulatory Visit: Payer: Self-pay

## 2021-02-28 ENCOUNTER — Telehealth: Payer: Self-pay

## 2021-02-28 ENCOUNTER — Encounter: Payer: Self-pay | Admitting: Family Medicine

## 2021-02-28 ENCOUNTER — Encounter (HOSPITAL_BASED_OUTPATIENT_CLINIC_OR_DEPARTMENT_OTHER): Payer: Self-pay | Admitting: Obstetrics and Gynecology

## 2021-02-28 ENCOUNTER — Ambulatory Visit: Payer: Medicare PPO | Admitting: Family Medicine

## 2021-02-28 ENCOUNTER — Emergency Department (HOSPITAL_BASED_OUTPATIENT_CLINIC_OR_DEPARTMENT_OTHER)
Admission: EM | Admit: 2021-02-28 | Discharge: 2021-02-28 | Disposition: A | Discharge status: Home / Self Care | Payer: Medicare PPO | Attending: Emergency Medicine | Admitting: Emergency Medicine

## 2021-02-28 ENCOUNTER — Telehealth: Payer: Self-pay | Admitting: *Deleted

## 2021-02-28 VITALS — BP 135/80 | HR 78 | Temp 98.3°F | Resp 15 | Wt 148.0 lb

## 2021-02-28 DIAGNOSIS — E039 Hypothyroidism, unspecified: Secondary | ICD-10-CM | POA: Insufficient documentation

## 2021-02-28 DIAGNOSIS — N3000 Acute cystitis without hematuria: Secondary | ICD-10-CM

## 2021-02-28 DIAGNOSIS — E876 Hypokalemia: Secondary | ICD-10-CM | POA: Diagnosis not present

## 2021-02-28 DIAGNOSIS — Z79899 Other long term (current) drug therapy: Secondary | ICD-10-CM | POA: Diagnosis not present

## 2021-02-28 DIAGNOSIS — I10 Essential (primary) hypertension: Secondary | ICD-10-CM | POA: Insufficient documentation

## 2021-02-28 DIAGNOSIS — I2510 Atherosclerotic heart disease of native coronary artery without angina pectoris: Secondary | ICD-10-CM | POA: Diagnosis not present

## 2021-02-28 DIAGNOSIS — Z7982 Long term (current) use of aspirin: Secondary | ICD-10-CM | POA: Insufficient documentation

## 2021-02-28 DIAGNOSIS — R300 Dysuria: Secondary | ICD-10-CM

## 2021-02-28 DIAGNOSIS — Z853 Personal history of malignant neoplasm of breast: Secondary | ICD-10-CM | POA: Insufficient documentation

## 2021-02-28 DIAGNOSIS — R198 Other specified symptoms and signs involving the digestive system and abdomen: Secondary | ICD-10-CM | POA: Diagnosis not present

## 2021-02-28 DIAGNOSIS — R197 Diarrhea, unspecified: Secondary | ICD-10-CM | POA: Diagnosis present

## 2021-02-28 DIAGNOSIS — Z006 Encounter for examination for normal comparison and control in clinical research program: Secondary | ICD-10-CM

## 2021-02-28 DIAGNOSIS — B9689 Other specified bacterial agents as the cause of diseases classified elsewhere: Secondary | ICD-10-CM | POA: Diagnosis not present

## 2021-02-28 LAB — URINALYSIS, ROUTINE W REFLEX MICROSCOPIC
Bilirubin Urine: NEGATIVE
Glucose, UA: NEGATIVE mg/dL
Ketones, ur: NEGATIVE mg/dL
Nitrite: NEGATIVE
Protein, ur: NEGATIVE mg/dL
Specific Gravity, Urine: 1.007 (ref 1.005–1.030)
pH: 6 (ref 5.0–8.0)

## 2021-02-28 LAB — BASIC METABOLIC PANEL
Anion gap: 11 (ref 5–15)
BUN: 18 mg/dL (ref 8–23)
CO2: 28 mmol/L (ref 22–32)
Calcium: 9.7 mg/dL (ref 8.9–10.3)
Chloride: 101 mmol/L (ref 98–111)
Creatinine, Ser: 0.81 mg/dL (ref 0.44–1.00)
GFR, Estimated: >60 mL/min (ref >60)
Glucose, Bld: 135 mg/dL — ABNORMAL HIGH (ref 70–99)
Potassium: 3.1 mmol/L — ABNORMAL LOW (ref 3.5–5.1)
Sodium: 140 mmol/L (ref 135–145)

## 2021-02-28 LAB — POCT URINALYSIS DIPSTICK
Bilirubin, UA: NEGATIVE
Glucose, UA: NEGATIVE
Ketones, UA: NEGATIVE
Nitrite, UA: NEGATIVE
Protein, UA: POSITIVE — AB
Spec Grav, UA: 1.015 (ref 1.010–1.025)
Urobilinogen, UA: 1 E.U./dL
pH, UA: 6.5 (ref 5.0–8.0)

## 2021-02-28 MED ORDER — AMOXICILLIN 875 MG PO TABS: 875.0000 mg | ORAL_TABLET | Freq: Two times a day (BID) | ORAL | 0 refills | Status: AC

## 2021-02-28 NOTE — ED Notes (Signed)
Lab notified to add on urine culture 

## 2021-02-28 NOTE — Telephone Encounter (Signed)
Patient has read side effects of her potassium chloride SA (KLOR-CON) 20 MEQ tablet and she believes its the cause of her constipation and her heart problems, she states she Is panicking because she has stopped taking it and is needing guidance can we please give patient a call back ASAP

## 2021-02-28 NOTE — Telephone Encounter (Signed)
OV needed for this? Unsure of what guidance to give pt.

## 2021-02-28 NOTE — Patient Instructions (Signed)
Please schedule an appointment with Dr. Yong Channel to follow up on your bowel if needed.   You may stop the Miralax at this time.  I think your worry may have gotten your bowels active again. Your xray looks ok.   Take the antibiotics for a UTI and we will call you if the urine culture shows a result that needs to be discussed.

## 2021-02-28 NOTE — Progress Notes (Signed)
Subjective   CC:  Chief Complaint  Patient presents with   Urinary Tract Infection    Symptoms started last week which has increased stress. Thinks this has caused her UTI symptoms. She did leave a sample today but has taken OTC AZO. I have ordered dipstick and culture    Same day acute visit; PCP not available. New pt to me. Chart reviewed.    HPI: [name] is a 80 y.o. female who presents to the office today to address the problems listed above in the chief complaint. Patient reports dysuria and urinary frequency.  She has sensation of increased urinary pressure.  She denies fevers flank pain nausea vomiting or gross hematuria.  Symptoms have been present for several days.  She denies history of interstitial cystitis.  She denies vaginal symptoms including vaginal discharge or pelvic pain.  I reviewed her chart and there are no documented UTIs in the last several years. I reviewed recent note: Here for change in bowel habits a few weeks ago.  She reports she ate out for dinner 1 night and came home and had an episode of explosive diarrhea.  Since, she has had loose intermittent and rare stool.  She denies black tarry stool.  She denies abdominal pain or cramping.  She was seen as an acute care visit and was diagnosed with constipation.  Rectal exam was negative for fecal impaction.  I reviewed chest x-ray which showed mild fecal retention without constipation.  She was told to use MiraLAX twice daily.  She continues to have a normal appetite.  She denies blood or mucus in the stool.  She is very worried that something could be wrong.  She was told in the past that she has "spastic colon".  No fevers or chills.  Assessment  1. Dysuria   2. Acute cystitis without hematuria   3. Change in bowel movement      Plan  Acute cystitis: Amoxicillin twice daily and await urine culture.  Routine counseling education given.  No signs of upper tract infection. Change in bowel habits: Not  consistent with constipation.  Recommend stopping MiraLAX.  Monitoring bowel habits and return for further investigation if persistent.  She could have irritable bowel syndrome and was admitted.  She admits to stressors, worries and grief.  She plans to go back to hospice counseling since she still continues to suffer from the death of her husband 3 years ago and her inability to completely deal with that given the COVID restrictions.  Her abdomen is benign.  Her vital signs are stable.  Reassured  Follow up: 2 to 3 weeks with Dr. Yong Channel if change in bowel persists Orders Placed This Encounter  Procedures   Urine Culture   POCT Urinalysis Dipstick   Meds ordered this encounter  Medications   amoxicillin (AMOXIL) 875 MG tablet    Sig: Take 1 tablet (875 mg total) by mouth 2 (two) times daily for 5 days.    Dispense:  10 tablet    Refill:  0      I reviewed the patients updated PMH, FH, and SocHx.    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, moderate stage 06/11/2018   Postsurgical states following surgery of eye and adnexa 06/11/2018   Capsular glaucoma of right eye with pseudoexfoliation (PXF) of lens, severe stage 05/07/2018   Primary open-angle glaucoma 11/20/2017   Pseudophakia of both eyes 11/20/2017   Grieving 02/18/2017   Osteoporosis 11/05/2016   Medicare annual wellness visit, subsequent 09/26/2014   Hypothyroidism 07/01/2013   Hypertension 11/02/2012   Vitamin D deficiency 11/02/2012   Hyperlipidemia 07/24/2011   Obesity (BMI 30-39.9)  07/24/2011   hx: breast cancer, IDC (Microinvasive) w DCIS, 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 - 07/10/2011   Current Meds  Medication Sig   AMBULATORY NON FORMULARY MEDICATION Take 180 mg by mouth daily. Medication Name: 180 mg bempedoic acid vs a placebo, CLEAR Research Study drug provided   amLODipine (NORVASC) 5 MG tablet Take 1 tablet (5 mg total) by mouth daily.   amoxicillin (AMOXIL) 875 MG tablet Take 1 tablet (875 mg total) by mouth 2 (two) times daily for 5 days.   aspirin EC 81 MG tablet Take 81 mg by mouth daily.   Bimatoprost (LUMIGAN OP) Place 1 drop into both eyes at bedtime.    chlorthalidone (HYGROTON) 25 MG tablet TAKE 1 TABLET ONCE DAILY.   Cholecalciferol (VITAMIN D-3) 5000 UNITS TABS Take 5,000 mg by mouth daily.   dorzolamide-timolol (COSOPT) 22.3-6.8 MG/ML ophthalmic solution Place 1 drop into both eyes 2 (two) times daily.    famotidine (PEPCID) 20 MG tablet Take 1 tablet (20 mg total) by mouth 2 (two) times daily.   icosapent Ethyl (VASCEPA) 1 g capsule Take 1 capsule (1 g total) by mouth 2 (two) times daily.   levothyroxine (SYNTHROID) 50 MCG tablet TAKE 1 TABLET BEFORE BREAKFAST.   metoprolol tartrate (LOPRESSOR) 25 MG tablet TAKE 1 TABLET BY MOUTH TWICE DAILY.   nitroGLYCERIN (NITROSTAT) 0.4 MG SL tablet Place 1 tablet (0.4 mg total) under the tongue every 5 (five) minutes x 3 doses as needed for chest pain.   pilocarpine (PILOCAR) 1 % ophthalmic solution Place 1 drop into both eyes 2 (two) times daily.    Polyethyl Glycol-Propyl Glycol (SYSTANE OP) Place 1-2 drops into both eyes as needed (for dry eyes).    potassium chloride SA (KLOR-CON) 20 MEQ tablet Take 1 tablet (20 mEq total) by mouth daily.   Probiotic Product (PROBIOTIC DAILY) CAPS Take 1 capsule by mouth daily. Reported on 9/37/9024   REPATHA SURECLICK 097 MG/ML SOAJ INJECT CONTENTS OF 1 PEN (140MG ) INTO SKIN EVERY 14 DAYS.   ticagrelor (BRILINTA) 90 MG TABS tablet Take 1 tablet (90 mg total) by mouth 2 (two) times daily.     Review of Systems: Cardiovascular: negative for chest pain Respiratory: negative for SOB or persistent cough Gastrointestinal: negative for abdominal pain Constitutional: Negative for fever malaise or anorexia  Objective  Vitals: BP 135/80   Pulse 78   Temp 98.3 F (36.8 C) (Temporal)   Resp 15   Wt 148 lb (67.1 kg)   SpO2 97%   BMI 26.22 kg/m  General: no acute distress, pleasant Psych:  Alert and oriented, anxious mood and affect Cardiovascular:  RRR without murmur or gallop. no peripheral edema Respiratory:  Good breath sounds bilaterally, CTAB with normal respiratory effort Gastrointestinal: soft, flat abdomen, normal active bowel sounds, no palpable masses, no hepatosplenomegaly, no appreciated hernias, NO CVAT, no suprapubic ttp w/o rebound or guarding Skin:  Warm, no rashes Neurologic:  Mental status is normal. normal gait Office Visit on 02/28/2021  Component Date Value Ref Range Status   Color, UA 02/28/2021 orange   Final   Clarity, UA 02/28/2021 hazy   Final   Glucose, UA 02/28/2021 Negative  Negative Final   Bilirubin, UA 02/28/2021 negative   Final   Ketones, UA 02/28/2021 negative   Final   Spec Grav, UA 02/28/2021 1.015  1.010 - 1.025 Final   Blood, UA 02/28/2021 3+   Final   pH, UA 02/28/2021 6.5  5.0 - 8.0 Final   Protein, UA 02/28/2021 Positive (A) Negative Final   Urobilinogen, UA 02/28/2021 1.0  0.2 or 1.0 E.U./dL Final   Nitrite, UA 02/28/2021 negative   Final   Leukocytes, UA 02/28/2021 Large (3+) (A) Negative Final   Commons side effects, risks, benefits, and alternatives for medications and treatment plan prescribed today were discussed, and the patient expressed understanding of the given instructions. Patient is instructed to call or message via MyChart if he/she has any questions or concerns regarding our treatment plan. No barriers to understanding were identified. We discussed Red Flag symptoms and signs in detail. Patient expressed  understanding regarding what to do in case of urgent or emergency type symptoms.  Medication list was reconciled, printed and provided to the patient in AVS. Patient instructions and summary information was reviewed with the patient as documented in the AVS. This note was prepared with assistance of Dragon voice recognition software. Occasional wrong-word or sound-a-like substitutions may have occurred due to the inherent limitations of voice recognition software

## 2021-02-28 NOTE — Discharge Instructions (Addendum)
1.  Take your potassium pill when you get home this evening.  Then, resume your usual prescribed dosing. 2.  Stop taking MiraLAX.  Eat a healthy diet.  Follow-up with your doctor if you continue to have abnormal stool pattern.  At that point, you should have referral back to your gastroenterologist for recheck. 3.  Take the amoxicillin as prescribed by your doctor for urinary tract infection.  Urine culture was obtained today.  That will help guide your antibiotic choice if a change should be needed.

## 2021-02-28 NOTE — Telephone Encounter (Signed)
Potassium can cause issues if levels get too high-it is safe and reasonable for her to take this to help get her levels back up as previously instructed-please encourage her to restart per prior directions

## 2021-02-28 NOTE — ED Triage Notes (Signed)
Patient reports to the ER for constipation. Patient reports she has been taking miralax. Patient reports she has been constipated x2 weeks.

## 2021-02-28 NOTE — ED Provider Notes (Signed)
[facility] EMERGENCY DEPT Provider Note   CSN: [id number] Arrival date & time: 02/28/21  1553     History Chief Complaint  Patient presents with   Constipation    [name] is a 80 y.o. female.  HPI Patient feels like she is been constipated for 2 weeks.  She reports that 2 weeks ago she ate out at [employer] and had explosive diarrhea that she attributed to the food.  She reports after that however she started having decreased frequency of bowel movement and constipation.  She reports that she had been reading about potassium at that time and had stopped her potassium due to interpreting the [employer] literature to indicate potassium causes constipation and also is a riskier supplement than your normal over-the-counter supplements.  Patient has been seen by her PCP in regards to this issue of constipation.  She was instructed to use MiraLAX.  She had a nontender abdomen and an x-ray was done which showed no acute findings.  Patient has not had any abdominal pain in association with this.  She reports she does have some bowel movement.  She reports is fairly thin and brown looking.  No vomiting, no fever.  Today patient was following up with her PCP for ongoing concerns of constipation despite taking MiraLAX.  Today she was told that her x-ray looked fine and her abdomen is nontender thus she should stop the MiraLAX.  She was also instructed to restart her potassium.  A dip urinalysis was done at the office today.  It was concluded that she had a UTI and was prescribed antibiotics.  Patient presents to the emergency department today for ongoing concerns about what to do about her potassium.  She is afraid to restart it yet she was told she needs it.  She is also concerned because she was told to discontinue the MiraLAX but she is not sure that she has resolved all of her constipation since her amount of stool output is still pretty small.  Patient had normal colonoscopy 2  years ago and was told at that time she would not need routine screening colonoscopy going forward.    Past Medical History:  Diagnosis Date   Abnormal thyroid function test 07/01/2013   Anxiety    Arthritis    Cancer ([place])    Coronary artery disease    Dermatitis 11/25/2013   Glaucoma 11/02/2012   HTN (hypertension) 11/02/2012   hx: breast cancer, IDC (Microinvasive) w DCIS, 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 - 07/10/2011   Hyperlipidemia    Hypothyroid    Obesity     Patient Active Problem List   Diagnosis Date Noted   PAD (peripheral artery disease) ([place]) 09/15/2020   Aortic atherosclerosis ([place]) 09/15/2020   Statin myopathy 07/01/2019   Hypokalemia 06/02/2019   CAD (coronary artery disease) s/p stent mCx 05/2019 06/02/2019   NSTEMI (non-ST elevated myocardial infarction) ([place]) 05/31/2019   Chest pain 12/29/2018   History of adenomatous polyp of colon 09/30/2018   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, moderate stage 06/11/2018   Postsurgical states following surgery of eye and adnexa 06/11/2018   Capsular glaucoma of right eye with pseudoexfoliation (PXF) of lens, severe stage 05/07/2018   Primary open-angle glaucoma 11/20/2017   Pseudophakia of both eyes 11/20/2017   Grieving 02/18/2017   Osteoporosis 11/05/2016   Medicare annual wellness visit, subsequent 09/26/2014   Hypothyroidism 07/01/2013   Hypertension 11/02/2012   Vitamin D deficiency 11/02/2012   Hyperlipidemia 07/24/2011  Obesity (BMI 30-39.9) 07/24/2011   hx: breast cancer, IDC (Microinvasive) w DCIS, 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 - 07/10/2011    Past Surgical History:  Procedure Laterality Date   AUGMENTATION MAMMAPLASTY Left    05/2006   BREAST SURGERY  10/2005   Left Mastectomy, reconstruction, reduction on right. Dr. Harlow Mares   CARDIAC CATHETERIZATION     COLONOSCOPY     CORONARY STENT INTERVENTION N/A 06/01/2019   Procedure: CORONARY STENT INTERVENTION;  Surgeon: Sherren Mocha, MD;  Location: [facility];  Service: Cardiovascular;  Laterality: N/A;   CRYOTHERAPY     GYN for CIN III   DILATION AND CURETTAGE OF UTERUS     EXCISION MORTON'S NEUROMA     right foot   EYE SURGERY Right 05/08/2018   LEFT HEART CATH AND CORONARY ANGIOGRAPHY N/A 06/01/2019   Procedure: LEFT HEART CATH AND CORONARY ANGIOGRAPHY;  Surgeon: Sherren Mocha, MD;  Location: [facility];  Service: Cardiovascular;  Laterality: N/A;   MASTECTOMY     REDUCTION MAMMAPLASTY Right      OB History     Gravida      Para      Term      Preterm      AB      Living  1      SAB      IAB      Ectopic      Multiple      Live Births              Family History  Problem Relation Age of Onset   Pneumonia Mother    Hyperlipidemia Mother    Parkinsonism Mother    CAD Mother    Other Father        bilateral subdural hematoma   Colon cancer Paternal Grandmother    Hyperlipidemia Sister    Prostate cancer Neg Hx    Breast cancer Neg Hx    Heart disease Neg Hx    Diabetes Neg Hx     Social History   Tobacco Use   Smoking status: Never   Smokeless tobacco: Never  Vaping Use   Vaping Use: Never used  Substance Use Topics   Alcohol use: Yes    Comment: Rare   Drug use: No    Home Medications Prior to Admission medications   Medication Sig Start Date End Date Taking? Authorizing Provider  AMBULATORY NON FORMULARY MEDICATION Take 180 mg by mouth daily. Medication Name: 180 mg bempedoic acid vs a placebo, CLEAR Research Study drug provided 11/01/16   Lelon Perla, MD  amLODipine (NORVASC) 5 MG tablet Take 1 tablet (5 mg total) by mouth daily. 10/03/20   Marin Olp, MD  amoxicillin (AMOXIL) 875 MG tablet Take 1 tablet (875 mg total) by mouth 2 (two) times daily for 5 days. 02/28/21 03/05/21  Leamon Arnt, MD  aspirin EC 81 MG tablet Take 81 mg by mouth daily.    [provider]  Bimatoprost (LUMIGAN OP) Place 1 drop into both eyes at bedtime.     [provider]  chlorthalidone (HYGROTON) 25 MG tablet TAKE 1 TABLET ONCE DAILY. 01/25/21   Marin Olp, MD  Cholecalciferol (VITAMIN D-3) 5000 UNITS TABS Take 5,000 mg by mouth daily.    [provider]  dorzolamide-timolol (COSOPT) 22.3-6.8 MG/ML ophthalmic solution Place 1 drop into both eyes 2 (two) times daily.     [provider]  famotidine (PEPCID) 20  MG tablet Take 1 tablet (20 mg total) by mouth 2 (two) times daily. 12/30/20   Fredia Sorrow, MD  icosapent Ethyl (VASCEPA) 1 g capsule Take 1 capsule (1 g total) by mouth 2 (two) times daily. 08/19/20   Fay Records, MD  levothyroxine (SYNTHROID) 50 MCG tablet TAKE 1 TABLET BEFORE BREAKFAST. 12/19/20   Marin Olp, MD  metoprolol tartrate (LOPRESSOR) 25 MG tablet TAKE 1 TABLET BY MOUTH TWICE DAILY. 02/20/21   Marin Olp, MD  nitroGLYCERIN (NITROSTAT) 0.4 MG SL tablet Place 1 tablet (0.4 mg total) under the tongue every 5 (five) minutes x 3 doses as needed for chest pain. 01/17/21   Fay Records, MD  pilocarpine (PILOCAR) 1 % ophthalmic solution Place 1 drop into both eyes 2 (two) times daily.     [provider]  Polyethyl Glycol-Propyl Glycol (SYSTANE OP) Place 1-2 drops into both eyes as needed (for dry eyes).     [provider]  potassium chloride SA (KLOR-CON) 20 MEQ tablet Take 1 tablet (20 mEq total) by mouth daily. 10/03/20   Marin Olp, MD  Probiotic Product (PROBIOTIC DAILY) CAPS Take 1 capsule by mouth daily. Reported on 10/10/2015    [provider]  REPATHA SURECLICK 154 MG/ML SOAJ INJECT CONTENTS OF 1 PEN (140MG ) INTO SKIN EVERY 14 DAYS. 10/21/20   Fay Records, MD  ticagrelor (BRILINTA) 90 MG TABS tablet Take 1 tablet (90 mg total) by mouth 2 (two) times daily. 06/28/20   Fay Records, MD    Allergies    Adhesive [tape] and Rosuvastatin  Review of Systems   Review of Systems 10 systems reviewed and negative except as per HPI Physical Exam Updated Vital  Signs BP (!) 146/60   Pulse 81   Temp 97.8 F (36.6 C)   Resp 17   Ht 5\' 2"  (1.575 m)   Wt 67.1 kg   SpO2 100%   BMI 27.07 kg/m   Physical Exam Constitutional:      Appearance: Normal appearance.  HENT:     Mouth/Throat:     Pharynx: Oropharynx is clear.  Eyes:     Extraocular Movements: Extraocular movements intact.  Cardiovascular:     Rate and Rhythm: Normal rate and regular rhythm.  Pulmonary:     Effort: Pulmonary effort is normal.     Breath sounds: Normal breath sounds.  Abdominal:     General: There is no distension.     Palpations: Abdomen is soft.     Tenderness: There is no abdominal tenderness. There is no guarding.  Musculoskeletal:        General: Normal range of motion.  Skin:    General: Skin is warm and dry.  Neurological:     General: No focal deficit present.     Mental Status: She is alert and oriented to person, place, and time.     Motor: No weakness.  Psychiatric:        Mood and Affect: Mood normal.    ED Results / Procedures / Treatments   Labs (all labs ordered are listed, but only abnormal results are displayed) Labs Reviewed  BASIC METABOLIC PANEL - Abnormal; Notable for the following components:      Result Value   Potassium 3.1 (*)    Glucose, Bld 135 (*)    All other components within normal limits  URINALYSIS, ROUTINE W REFLEX MICROSCOPIC - Abnormal; Notable for the following components:   Hgb urine dipstick SMALL (*)  Leukocytes,Ua MODERATE (*)    Bacteria, UA RARE (*)    All other components within normal limits  URINE CULTURE    EKG None  Radiology No results found.  Procedures Procedures   Medications Ordered in ED Medications - No data to display  ED Course  I have reviewed the triage vital signs and the nursing notes.  Pertinent labs & imaging results that were available during my care of the patient were reviewed by me and considered in my medical decision making (see chart for details).    MDM  Rules/Calculators/A&P                          Patient is clinically well in appearance.  She has a soft and nontender abdomen.  Potassium is at 3.1.  Patient took her dose this morning.  She is counseled to take a second dose this evening at home.  She will then resume her usual potassium dosing and follow-up with her physician.  No signs of constipation.  Patient describes change in her stool pattern since an explosive episode of diarrhea that sounds food related 2 weeks ago.  She has no abdominal pain on exam.  Patient is counseled to stop her MiraLAX as instructed by her primary care clinic today.  She is advised if her stools do not normalize or she gets other symptoms she should discuss referral back to her gastroenterologist for recheck.  Urinalysis is positive.  She describes some dysuria.  Patient was prescribed amoxicillin 875 by her primary provider.  She is instructed to start this as prescribed.  A urine culture was added to today's diagnostic evaluation to help guide antibiotic choice if changes are needed. Final Clinical Impression(s) / ED Diagnoses Final diagnoses:  Acute cystitis without hematuria  Hypokalemia    Rx / DC Orders ED Discharge Orders     None        Charlesetta Shanks, MD 02/28/21 1910

## 2021-02-28 NOTE — Telephone Encounter (Signed)
I called patient for 30-day post study Clear phone call. Patient stated would call me back. She was waiting on doctor to call her.

## 2021-03-01 ENCOUNTER — Telehealth: Payer: Self-pay

## 2021-03-01 ENCOUNTER — Ambulatory Visit: Payer: Medicare PPO | Attending: Audiologist | Admitting: Audiologist

## 2021-03-01 DIAGNOSIS — H903 Sensorineural hearing loss, bilateral: Secondary | ICD-10-CM | POA: Insufficient documentation

## 2021-03-01 NOTE — Telephone Encounter (Signed)
Called and spoke with pt and gave below message. Pt states she went to [facility] on 06/21 and she feels much better.

## 2021-03-01 NOTE — Telephone Encounter (Signed)
Patient seen by Dr. Jonni Sanger and ED yesterday. Advised to see PCP in 3 days, Please advise if patient is able to be worked in or if needing to see another provider.

## 2021-03-01 NOTE — Telephone Encounter (Signed)
See below

## 2021-03-01 NOTE — Telephone Encounter (Signed)
LVM for patient to call back and schedule with Dr. Yong Channel.

## 2021-03-01 NOTE — Procedures (Signed)
  [facility], [place]  [address] [phone]  AUDIOLOGICAL  EVALUATION  NAME: [name]     DOB:   07/31/1941      MRN: [id number]                                                                                     DATE: 03/01/2021     REFERENT: Marin Olp, MD STATUS: Outpatient DIAGNOSIS: Sensorineural Hearing Loss Bilateral    History: [name] was seen for an audiological evaluation. [name] is receiving a hearing evaluation due to concerns for increased difficulty hearing over the last few months . [name] has difficulty hearing in background noise, crowds, and when people are at a distance. This difficulty began gradually. No pain or pressure reported in either ear.  No tinnitus in either ear. 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  Medical history negative for a risk factor for hearing loss. No other relevant case history reported.    Evaluation:  Otoscopy showed a clear view of the tympanic membranes, bilaterally Tympanometry results were consistent with normal middle ear function bilaterally   Audiometric testing was completed using conventional audiometry with insert transducer. Speech Recognition Thresholds were consistent with pure tone averages. Word Recognition was good at an elevated level. Pure tone thresholds show normal sloping to severe sensorineural hearing loss in both ears. Test results are consistent with presbycusis.   Results:  The test results were reviewed with [name], she  was counseled on the nature and degree of hers hearing loss. she was provided with several copies of her audiogram that illustrate her degree of hearing loss in both ears. Her hearing loss is in the high frequencies preventing [name] from hearing high frequency consonants such as /s/ /sh/ /f/ /t/ and /th/. These sounds help differentiate the words she hears. Without these sounds, speech is  muffled and unclear unless someone is face to face within 5 feet without a mask. [name] is an excellent hearing aid candidate. Her ability to understand speech improved significantly at an elevated volume. With hearing aids the clarity of speech will improve and [name] will not have to work so hard to hear. [name] was provided with a list of audiologists that dispense hearing aids.   Recommendations: Amplification is necessary for both ears. Hearing aids can be purchased from a variety of locations. See provided list for locations in the [place] area.    [name]  Audiologist, Au.D., CCC-A 03/01/2021  2:58 PM  Cc: Marin Olp, MD

## 2021-03-01 NOTE — Telephone Encounter (Signed)
Can work in tomorrow at 1140-may be running behind please give her a heads up

## 2021-03-02 ENCOUNTER — Other Ambulatory Visit: Payer: Self-pay

## 2021-03-02 ENCOUNTER — Encounter: Payer: Self-pay | Admitting: Family Medicine

## 2021-03-02 ENCOUNTER — Ambulatory Visit: Payer: Medicare PPO | Admitting: Family Medicine

## 2021-03-02 VITALS — BP 140/68 | HR 78 | Temp 98.7°F | Ht 63.0 in | Wt 148.4 lb

## 2021-03-02 DIAGNOSIS — F4321 Adjustment disorder with depressed mood: Secondary | ICD-10-CM | POA: Diagnosis not present

## 2021-03-02 DIAGNOSIS — K5900 Constipation, unspecified: Secondary | ICD-10-CM

## 2021-03-02 DIAGNOSIS — N3000 Acute cystitis without hematuria: Secondary | ICD-10-CM

## 2021-03-02 LAB — URINE CULTURE
MICRO NUMBER:: 12031938
SPECIMEN QUALITY:: ADEQUATE

## 2021-03-02 NOTE — Progress Notes (Signed)
Phone [phone] In person visit   Subjective:   [name] is a 80 y.o. year old very pleasant female patient who presents for/with See problem oriented charting Chief Complaint  Patient presents with   Emergency Room Follow Up     This visit occurred during the SARS-CoV-2 public health emergency.  Safety protocols were in place, including screening questions prior to the visit, additional usage of staff PPE, and extensive cleaning of exam room while observing appropriate contact time as indicated for disinfecting solutions.   Past Medical History-  Patient Active Problem List   Diagnosis Date Noted   CAD (coronary artery disease) s/p stent mCx 05/2019 06/02/2019    Priority: High   Grieving 02/18/2017    Priority: High   Osteoporosis 11/05/2016    Priority: High   Aortic atherosclerosis ([place]) 09/15/2020    Priority: Medium   Chest pain 12/29/2018    Priority: Medium   Primary open-angle glaucoma 11/20/2017    Priority: Medium   Hypothyroidism 07/01/2013    Priority: Medium   Hypertension 11/02/2012    Priority: Medium   Hyperlipidemia 07/24/2011    Priority: Medium   hx: breast cancer, IDC (Microinvasive) w DCIS, 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 - 07/10/2011    Priority: Medium   History of adenomatous polyp of colon 09/30/2018    Priority: Low   Medicare annual wellness visit, subsequent 09/26/2014    Priority: Low   Vitamin D deficiency 11/02/2012    Priority: Low   Obesity (BMI 30-39.9) 07/24/2011    Priority: Low   PAD (peripheral artery disease) ([place]) 09/15/2020   Statin myopathy 07/01/2019   Hypokalemia 06/02/2019   NSTEMI (non-ST elevated myocardial infarction) ([place]) 05/31/2019   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 Medication Name: 180 mg bempedoic acid vs a placebo, CLEAR Research Study drug provided     amLODipine (NORVASC) 5 MG tablet Take 1 tablet (5 mg total) by mouth daily. 90 tablet 3   amoxicillin (AMOXIL) 875 MG tablet Take 1 tablet (875 mg total) by mouth 2 (two) times daily for 5 days. 10 tablet 0   aspirin EC 81 MG tablet Take 81 mg by mouth daily.     Bimatoprost (LUMIGAN OP) Place 1 drop into both eyes at bedtime.      chlorthalidone (HYGROTON) 25 MG tablet TAKE 1 TABLET ONCE DAILY. 90 tablet 0   Cholecalciferol (VITAMIN D-3) 5000 UNITS TABS Take 5,000 mg by mouth daily.     dorzolamide-timolol (COSOPT) 22.3-6.8 MG/ML ophthalmic solution Place 1 drop into both eyes 2 (two) times daily.      famotidine (PEPCID) 20 MG tablet Take 1 tablet (20 mg total) by mouth 2 (two) times daily. 30 tablet 0   icosapent Ethyl (VASCEPA) 1 g capsule Take 1 capsule (1 g total) by mouth 2 (two) times daily. 60 capsule 11   levothyroxine (SYNTHROID) 50 MCG tablet TAKE 1 TABLET BEFORE BREAKFAST. 90 tablet 0   metoprolol tartrate (LOPRESSOR) 25 MG tablet TAKE 1 TABLET BY MOUTH TWICE DAILY. 180 tablet 0   nitroGLYCERIN (NITROSTAT) 0.4 MG SL tablet Place 1 tablet (  0.4 mg total) under the tongue every 5 (five) minutes x 3 doses as needed for chest pain. 25 tablet 3   pilocarpine (PILOCAR) 1 % ophthalmic solution Place 1 drop into both eyes 2 (two) times daily.      Polyethyl Glycol-Propyl Glycol (SYSTANE OP) Place 1-2 drops into both eyes as needed (for dry eyes).      potassium chloride SA (KLOR-CON) 20 MEQ tablet Take 1 tablet (20 mEq total) by mouth daily. 90 tablet 3   Probiotic Product (PROBIOTIC DAILY) CAPS Take 1 capsule by mouth daily. Reported on 10/10/2015     REPATHA SURECLICK 144 MG/ML SOAJ INJECT CONTENTS OF 1 PEN (140MG ) INTO SKIN  EVERY 14 DAYS. 2 mL 11   ticagrelor (BRILINTA) 90 MG TABS tablet Take 1 tablet (90 mg total) by mouth 2 (two) times daily. 60 tablet 11   No current facility-administered medications for this visit.     Objective:  BP 140/68   Pulse 78   Temp 98.7 F (37.1 C) (Temporal)   Ht 5\' 3"  (1.6 m)   Wt 148 lb 6.4 oz (67.3 kg)   SpO2 98%   BMI 26.29 kg/m  Gen: NAD, resting comfortably CV: RRR no murmurs rubs or gallops Lungs: CTAB no crackles, wheeze, rhonchi Abdomen: soft/nontender/nondistended/normal bowel sounds. No rebound or guarding.  Ext: no edema Skin: warm, dry     Assessment and Plan  # ED F/U for Acute Cystitis without Hematuria #Grieving-loss of husband in 2019 still weighs heavily on her-she feels like any physical symptoms are more difficult to navigate without her deceased husband who ws a physician S:Patient presented to the ED on 02/28/21. For the past 2 weeks prior to the visit she felt very constipated- she ate out at a restaurant and had explosive diarrhea that she related to the food (fish at [employer]).  Patient denies abdominal pain outside of some left lower quadrant pain when she has diarrhea-no recurrence since that time.  Afterwards, she experienced decreased frequency of bowel movements and constipation. She was having small BM every 3 days- was straining to get this out  (normal would be daily with no straining). She was taking miralax and moved up to twice a day. She read up on potassium at that time and had held off on it due to interpreting the [employer] information stating that it may cause constipation and also is a riskier supplement than over-the-counter supplements- however she did take her dose of potassium that morning and BMP found potassium to be at 3.1. She has been consistent with potassium since that time. Today finally had a good bowel movement that was normal and felt an urge to go.   An X-ray showed no acute findings and her abdomen was  non-tender. Denied any abdominal pain, vomiting, or fever. Stool was reported to be thin and brown in appearance. No blood in stool other than light amount with wiping when was constipated. Dip urinalysis was administered also and concluded that she had a UTI- was later prescribed antibiotics- Amoxicillin 875 per primary provider and was instructed to start this. Advised if stools were not normalized or symptoms worsen that she should discuss a referral back to her gastroenterologist for a recheck.  She has called palliative care and is going to work with them as private patient- aging process and being without husband has been hard on her.  A/P: Patient stopped her potassium related to concern about constipation-I do not think constipation was caused by potassium  and she has restarted her potassium-numbers are likely better/normal range now-offered to review today but she would like to hold off and we can recheck in August.  Thankfully constipation seems to be better at this point-I wonder if she just had a rather significant clear out with her explosive diarrhea and took some time but he got to reform good volume of stool-had a nice normal bowel movement this morning.  She plans to add Metamucil which I think is a great choice.  She will only use MiraLAX if no bowel movement within 3 days. Make sure to stay well hydrated  Urine culture still pending-encouraged patient to complete antibiotic course while culture pending-does appear to have E. coli-hoping sensitive to amoxicillin  Patient needed extended counseling in regards to her husband being deceased-recommended hospice grief counseling  Blood pressure slightly high today-has been well controlled in the past-update next visit  BP Readings from Last 3 Encounters:  03/02/21 140/68  02/28/21 135/80  02/28/21 135/80     Recommended follow up: Keep August visit Future Appointments  Date Time Provider [place]  04/20/2021 11:00 AM Marin Olp, MD [facility]  06/01/2021  8:30 AM [facility] HEALTH COACH [facility]    Lab/Order associations:   ICD-10-CM   1. Grieving  F43.21     2. Constipation, unspecified constipation type  K59.00     3. Acute cystitis without hematuria  N30.00      I,Harris Phan,acting as a scribe for Garret Reddish, MD.,have documented all relevant documentation on the behalf of Garret Reddish, MD,as directed by  Garret Reddish, MD while in the presence of Garret Reddish, MD.   I, Garret Reddish, MD, have reviewed all documentation for this visit. The documentation on 03/02/21 for the exam, diagnosis, procedures, and orders are all accurate and complete.    Time Spent: 30 minutes of total time (12:25 PM-12:55 PM) was spent on the date of the encounter performing the following actions: chart review prior to seeing the patient, obtaining history, performing a medically necessary exam, counseling on the treatment plan, placing orders, and documenting in our EHR.   Return precautions advised.  Garret Reddish, MD

## 2021-03-02 NOTE — Patient Instructions (Addendum)
I think it would be a good idea to go back to hospice grief counseling  Thrilled your bowel habits are doing better  Continue potassium  I am fine with your idea of metamucil   She will only use MiraLAX if no bowel movement within 3 days. Make sure to stay well hydrated  Recommended follow up: keep august visit- recheck potassium then

## 2021-03-03 ENCOUNTER — Telehealth: Payer: Self-pay

## 2021-03-03 ENCOUNTER — Other Ambulatory Visit: Payer: Self-pay

## 2021-03-03 DIAGNOSIS — R300 Dysuria: Secondary | ICD-10-CM

## 2021-03-03 LAB — URINE CULTURE: Culture: >=100000 — AB

## 2021-03-03 MED ORDER — SULFAMETHOXAZOLE-TRIMETHOPRIM 800-160 MG PO TABS: 1.0000 | ORAL_TABLET | Freq: Two times a day (BID) | ORAL | 0 refills | Status: DC

## 2021-03-03 NOTE — Telephone Encounter (Signed)
Error Phone Note

## 2021-03-03 NOTE — Progress Notes (Signed)
Please call patient: I have reviewed his/her lab results. Urine is infected with organism resistant to amox; please order septra ds 1 tab po bid x 5 days and tell patient to switch. Thanks.

## 2021-03-04 ENCOUNTER — Telehealth: Payer: Self-pay | Admitting: Emergency Medicine

## 2021-03-04 NOTE — Progress Notes (Signed)
ED Antimicrobial Stewardship Positive Culture Follow Up   [name] is an 80 y.o. female who presented to [facility] on 02/28/2021 with a chief complaint of diarrhea.  Chief Complaint  Patient presents with   Constipation    Recent Results (from the past 720 hour(s))  Urine Culture     Status: Abnormal   Collection Time: 02/28/21  9:20 AM   Specimen: Urine  Result Value Ref Range Status   MICRO NUMBER: [id number]  Final   SPECIMEN QUALITY: Adequate  Final   Sample Source NOT GIVEN  Final   STATUS: FINAL  Final   ISOLATE 1: Escherichia coli (A)  Final    Comment: Greater than 100,000 CFU/mL of Escherichia coli      Susceptibility   Escherichia coli - URINE CULTURE, REFLEX    AMOX/CLAVULANIC >=32 Resistant     AMPICILLIN >=32 Resistant     AMPICILLIN/SULBACTAM >=32 Resistant     CEFAZOLIN* 8 Resistant      * For uncomplicated UTI caused by E. coli, K. pneumoniae or P. mirabilis: Cefazolin is susceptible if MIC <32 mcg/mL and predicts susceptible to the oral agents cefaclor, cefdinir, cefpodoxime, cefprozil, cefuroxime, cephalexin and loracarbef.     CEFTAZIDIME <=1 Sensitive     CEFEPIME <=1 Sensitive     CEFTRIAXONE <=1 Sensitive     CIPROFLOXACIN <=0.25 Sensitive     LEVOFLOXACIN <=0.12 Sensitive     GENTAMICIN <=1 Sensitive     IMIPENEM <=0.25 Sensitive     NITROFURANTOIN <=16 Sensitive     PIP/TAZO <=4 Sensitive     TOBRAMYCIN <=1 Sensitive     TRIMETH/SULFA* <=20 Sensitive      * For uncomplicated UTI caused by E. coli, K. pneumoniae or P. mirabilis: Cefazolin is susceptible if MIC <32 mcg/mL and predicts susceptible to the oral agents cefaclor, cefdinir, cefpodoxime, cefprozil, cefuroxime, cephalexin and loracarbef. Legend: S = Susceptible  I = Intermediate R = Resistant  NS = Not susceptible * = Not tested  NR = Not reported **NN = See antimicrobic comments   Urine culture     Status: Abnormal   Collection Time: 02/28/21  5:49 PM   Specimen: Urine,  Random  Result Value Ref Range Status   Specimen Description   Final    URINE, RANDOM Performed at [facility], [address], [place], [place] [address]    Special Requests   Final    NONE Performed at [facility], [address] [place], [place] [address]    Culture >=100,000 COLONIES/mL ESCHERICHIA COLI (A)  Final   Report Status 03/03/2021 FINAL  Final   Organism ID, Bacteria ESCHERICHIA COLI (A)  Final      Susceptibility   Escherichia coli - MIC*    AMPICILLIN >=32 RESISTANT Resistant     CEFAZOLIN 8 SENSITIVE Sensitive     CEFEPIME <=0.12 SENSITIVE Sensitive     CEFTRIAXONE 1 SENSITIVE Sensitive     CIPROFLOXACIN <=0.25 SENSITIVE Sensitive     GENTAMICIN <=1 SENSITIVE Sensitive     IMIPENEM <=0.25 SENSITIVE Sensitive     NITROFURANTOIN <=16 SENSITIVE Sensitive     TRIMETH/SULFA <=20 SENSITIVE Sensitive     AMPICILLIN/SULBACTAM >=32 RESISTANT Resistant     PIP/TAZO <=4 SENSITIVE Sensitive     * >=100,000 COLONIES/mL ESCHERICHIA COLI    [x]  Treated with amoxicillin, organism resistant to prescribed antimicrobial   New antibiotic prescription: Cephalexin 500 mg twice daily x 5 days.   ED Provider: Charlesetta Shanks, MD  Albertina Parr, PharmD., BCPS, BCCCP Clinical Pharmacist Please refer to [facility] for unit-specific pharmacist

## 2021-03-04 NOTE — Telephone Encounter (Signed)
Post ED Visit - Positive Culture Follow-up: Successful Patient Follow-Up  Culture assessed and recommendations reviewed by:  []  Elenor Quinones, Pharm.D. []  Heide Guile, Pharm.D., BCPS AQ-ID []  Parks Neptune, Pharm.D., BCPS []  Alycia Rossetti, Pharm.D., BCPS []  [place], [place].D., BCPS, AAHIVP []  Legrand Como, Pharm.D., BCPS, AAHIVP []  Salome Arnt, PharmD, BCPS []  Johnnette Gourd, PharmD, BCPS []  Hughes Better, PharmD, BCPS []  Leeroy Cha, PharmD  Positive urine culture  []  Patient discharged without antimicrobial prescription and treatment is now indicated [x]  Organism is resistant to prescribed ED discharge antimicrobial []  Patient with positive blood cultures  Changes discussed with ED provider: Dr Charlesetta Shanks  New antibiotic prescription d/c amoxicllin, start cephalexin 500mg  po twice daily x 5 days  Patient states her antibiotics were changed by her PCP yesterday therefore no need for new rx   [name] 03/04/2021, 1:24 PM

## 2021-03-06 ENCOUNTER — Telehealth: Payer: Self-pay | Admitting: *Deleted

## 2021-03-06 DIAGNOSIS — Z006 Encounter for examination for normal comparison and control in clinical research program: Secondary | ICD-10-CM

## 2021-03-06 NOTE — Telephone Encounter (Signed)
I called patient for 30 day post study phone call. Patient is doing well and recovering from cystitis with prescribed antibiotics. I reminded patient to follow-up with Dr. Yong Channel for lipid panel and the Nexletol is on the market now and can be prescribed to her.by Dr. Yong Channel. I reminded her to eat heart healthy diet and be involved with a regular exercise program. I thanked patient for her participation in the study.

## 2021-03-06 NOTE — Addendum Note (Signed)
Addended by: Sandie Ano D on: 03/06/2021 09:53 AM   Modules accepted: Orders

## 2021-03-24 ENCOUNTER — Other Ambulatory Visit: Payer: Self-pay | Admitting: Family Medicine

## 2021-03-27 ENCOUNTER — Other Ambulatory Visit: Payer: Self-pay | Admitting: Family Medicine

## 2021-03-27 DIAGNOSIS — Z1231 Encounter for screening mammogram for malignant neoplasm of breast: Secondary | ICD-10-CM

## 2021-04-20 ENCOUNTER — Encounter: Payer: Self-pay | Admitting: Family Medicine

## 2021-04-20 ENCOUNTER — Other Ambulatory Visit: Payer: Self-pay | Admitting: Family Medicine

## 2021-04-20 ENCOUNTER — Ambulatory Visit: Payer: Medicare PPO | Admitting: Family Medicine

## 2021-04-20 ENCOUNTER — Other Ambulatory Visit: Payer: Self-pay

## 2021-04-20 VITALS — BP 141/64 | HR 74 | Temp 97.9°F | Ht 63.0 in | Wt 152.8 lb

## 2021-04-20 DIAGNOSIS — I10 Essential (primary) hypertension: Secondary | ICD-10-CM | POA: Diagnosis not present

## 2021-04-20 DIAGNOSIS — E039 Hypothyroidism, unspecified: Secondary | ICD-10-CM

## 2021-04-20 DIAGNOSIS — M810 Age-related osteoporosis without current pathological fracture: Secondary | ICD-10-CM | POA: Diagnosis not present

## 2021-04-20 DIAGNOSIS — E782 Mixed hyperlipidemia: Secondary | ICD-10-CM

## 2021-04-20 DIAGNOSIS — E559 Vitamin D deficiency, unspecified: Secondary | ICD-10-CM

## 2021-04-20 LAB — COMPREHENSIVE METABOLIC PANEL
ALT: 22 U/L (ref 0–35)
AST: 21 U/L (ref 0–37)
Albumin: 4.8 g/dL (ref 3.5–5.2)
Alkaline Phosphatase: 63 U/L (ref 39–117)
BUN: 13 mg/dL (ref 6–23)
CO2: 31 mEq/L (ref 19–32)
Calcium: 9.9 mg/dL (ref 8.4–10.5)
Chloride: 101 mEq/L (ref 96–112)
Creatinine, Ser: 0.8 mg/dL (ref 0.40–1.20)
GFR: 69.94 mL/min (ref >60.00)
Glucose, Bld: 105 mg/dL — ABNORMAL HIGH (ref 70–99)
Potassium: 3.4 mEq/L — ABNORMAL LOW (ref 3.5–5.1)
Sodium: 141 mEq/L (ref 135–145)
Total Bilirubin: 0.9 mg/dL (ref 0.2–1.2)
Total Protein: 7.1 g/dL (ref 6.0–8.3)

## 2021-04-20 LAB — LIPID PANEL
Cholesterol: 174 mg/dL (ref 0–200)
HDL: 70.9 mg/dL (ref >39.00)
LDL Cholesterol: 83 mg/dL (ref 0–99)
NonHDL: 103.5
Total CHOL/HDL Ratio: 2
Triglycerides: 101 mg/dL (ref 0.0–149.0)
VLDL: 20.2 mg/dL (ref 0.0–40.0)

## 2021-04-20 LAB — VITAMIN D 25 HYDROXY (VIT D DEFICIENCY, FRACTURES): VITD: 46.85 ng/mL (ref 30.00–100.00)

## 2021-04-20 LAB — TSH: TSH: 1.85 u[IU]/mL (ref 0.35–5.50)

## 2021-04-20 MED ORDER — BEMPEDOIC ACID 180 MG PO TABS: 1.0000 | ORAL_TABLET | Freq: Every day | ORAL | 3 refills | Status: DC

## 2021-04-20 MED ORDER — AMLODIPINE BESYLATE 5 MG PO TABS: 7.5000 mg | ORAL_TABLET | Freq: Every day | ORAL | 3 refills | Status: DC

## 2021-04-20 NOTE — Patient Instructions (Addendum)
Health Maintenance Due  Topic Date Due   COVID-19 Vaccine (4 - Booster for [employer] series) Discuss.  Please consider getting your 4th COVID-19 vaccination at you local pharmacy. I understand your opinion to wait until the new vaccination for the newest COVID-19 strand.  10/18/2020   INFLUENZA VACCINE Not available in office yet.   Please consider getting your flu shot in the Fall. If you get this outside of our office, please let us know.  Can call back for shot here in a month or so  04/10/2021   Please stop by lab before you go If you have mychart- we will send your results within 3 business days of [place] receiving them.  If you do not have mychart- we will call you about results within 5 business days of [place] receiving them.  *please also note that you will see labs on mychart as soon as they post. I will later go in and write notes on them- will say "notes from Dr. Yong Channel"  If LDL goal is above 55, we will restart you on bempedoic acid  Increase Amlodipine to 1.5 tablets in the evening. Please monitor this change for potential swelling in your legs.  I am glad to hear that the Hospice Grief Counseling program has been very helpful for you!  Recommended follow up: 1 to 2 month follow-up for blood pressure recheck.

## 2021-04-20 NOTE — Progress Notes (Signed)
Phone [phone] In person visit   Subjective:   [name] is a 80 y.o. year old very pleasant female patient who presents for/with See problem oriented charting Chief Complaint  Patient presents with   Hyperlipidemia   Hypothyroidism   Hypertension   This visit occurred during the SARS-CoV-2 public health emergency.  Safety protocols were in place, including screening questions prior to the visit, additional usage of staff PPE, and extensive cleaning of exam room while observing appropriate contact time as indicated for disinfecting solutions.   Past Medical History-  Patient Active Problem List   Diagnosis Date Noted   CAD (coronary artery disease) s/p stent mCx 05/2019 06/02/2019    Priority: High   Grieving 02/18/2017    Priority: High   Osteoporosis 11/05/2016    Priority: High   Aortic atherosclerosis ([place]) 09/15/2020    Priority: Medium   Chest pain 12/29/2018    Priority: Medium   Primary open-angle glaucoma 11/20/2017    Priority: Medium   Hypothyroidism 07/01/2013    Priority: Medium   Hypertension 11/02/2012    Priority: Medium   Hyperlipidemia 07/24/2011    Priority: Medium   hx: breast cancer, IDC (Microinvasive) w DCIS, 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 - 07/10/2011    Priority: Medium   History of adenomatous polyp of colon 09/30/2018    Priority: Low   Medicare annual wellness visit, subsequent 09/26/2014    Priority: Low   Vitamin D deficiency 11/02/2012    Priority: Low   Obesity (BMI 30-39.9) 07/24/2011    Priority: Low   PAD (peripheral artery disease) ([place]) 09/15/2020   Statin myopathy 07/01/2019   Hypokalemia 06/02/2019   NSTEMI (non-ST elevated myocardial infarction) ([place]) 05/31/2019   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     Bimatoprost (LUMIGAN OP) Place 1 drop into both eyes at bedtime.      chlorthalidone (HYGROTON) 25 MG tablet TAKE 1 TABLET ONCE DAILY. 90 tablet 0   Cholecalciferol (VITAMIN D-3) 5000 UNITS TABS Take 5,000 mg by mouth daily.     dorzolamide-timolol (COSOPT) 22.3-6.8 MG/ML ophthalmic solution Place 1 drop into both eyes 2 (two) times daily.      famotidine (PEPCID) 20 MG tablet Take 1 tablet (20 mg total) by mouth 2 (two) times daily. 30 tablet 0   icosapent Ethyl (VASCEPA) 1 g capsule Take 1 capsule (1 g total) by mouth 2 (two) times daily. 60 capsule 11   levothyroxine (SYNTHROID) 50 MCG tablet TAKE ONE TABLET BEFORE BREAKFAST. 90 tablet 0   metoprolol tartrate (LOPRESSOR) 25 MG tablet TAKE 1 TABLET BY MOUTH TWICE DAILY. 180 tablet 0   nitroGLYCERIN (NITROSTAT) 0.4 MG SL tablet Place 1 tablet (0.4 mg total) under the tongue every 5 (five) minutes x 3 doses as needed for chest pain. 25 tablet 3   pilocarpine (PILOCAR) 1 % ophthalmic solution Place 1 drop into both eyes 2 (two) times daily.      Polyethyl Glycol-Propyl Glycol (SYSTANE OP) Place 1-2 drops into both eyes as needed (for dry eyes).      potassium chloride SA (KLOR-CON) 20 MEQ  tablet Take 1 tablet (20 mEq total) by mouth daily. 90 tablet 3   Probiotic Product (PROBIOTIC DAILY) CAPS Take 1 capsule by mouth daily. Reported on 10/10/2015     REPATHA SURECLICK XX123456 MG/ML SOAJ INJECT CONTENTS OF 1 PEN ('140MG'$ ) INTO SKIN EVERY 14 DAYS. 2 mL 11   sulfamethoxazole-trimethoprim (BACTRIM DS) 800-160 MG tablet Take 1 tablet by mouth 2 (two) times daily. 10 tablet 0   ticagrelor (BRILINTA) 90 MG TABS tablet Take 1 tablet (90 mg total) by mouth 2 (two) times daily. 60 tablet 11   amLODipine (NORVASC) 5 MG tablet Take 1.5 tablets (7.5 mg total) by mouth  daily. 135 tablet 3   No current facility-administered medications for this visit.     Objective:  BP (!) 141/64   Pulse 74   Temp 97.9 F (36.6 C) (Temporal)   Ht '5\' 3"'$  (1.6 m)   Wt 152 lb 12.8 oz (69.3 kg)   SpO2 99%   BMI 27.07 kg/m  Gen: NAD, resting comfortably CV: RRR no murmurs rubs or gallops Lungs: CTAB no crackles, wheeze, rhonchi Ext: trace edema Skin: warm, dry     Assessment and Plan   #grieving- did return to hospice grief counseling- has been twice and will go back in another month- has found this helpful  #CAD-follows with Dr. Harrington Challenger on brilinta and aspirin #PAD listed by Dr. Mable Fill does have aortic atherosclerosis #hyperlipidemia S: Medication: Repeatha 140 mg every 2 weeks and Vascepa 1 g-also on clear study through June- was on bempedoic acid/nexletol- now off  - on aspirin 81 mg and Brilinta   No chest pain or shortness of breath. No claudicatoin Lab Results  Component Value Date   CHOL 131 10/27/2020   HDL 64 10/27/2020   LDLCALC 53 10/27/2020   LDLDIRECT 114.0 04/29/2019   TRIG 65 10/27/2020   CHOLHDL 2.0 10/27/2020  A/P: CAD and PAD are asymptomatic-continue current medications  In regards to hyperlipidemia she was recently taken off of bempedoic acid/Nexletol after the clear study ended about 2 months ago-we are going to recheck lipids and see where things stand-if LDL above 55 would consider restarting this and I can prescribe it  #hypertension S: medication: Metoprolol 25 mg twice daily, amlodipine 5 mg, chlorthalidone 25 mg with potassium  daily Home readings #s: low 140s/70s for most part  Patient continues to work with nutritionist every few weeks and is a member at [employer] for exercise- not recently- trying to do it on her own- weight up slightly. Doing some workships on cholesterol BP Readings from Last 3 Encounters:  04/20/21 (!) 141/64  03/02/21 140/68  02/28/21 135/80  A/P: Mild poor control of blood pressure at home and  in office typically running in the low 140s-would prefer her at least under 140 with cardiac history-we are going to increase amlodipine to 7.5 mg and encourage 1 to 81-monthfollow-up  #hypothyroidism S: compliant On thyroid medication-levothyroxine 50 mcg Lab Results  Component Value Date   TSH 2.36 09/15/2020  A/P:hopefully stable- update tsh today. Continue current meds for now    #Vitamin D deficiency S: Medication: 5000 units daily  Last vitamin D Lab Results  Component Value Date   VD25OH 45 05/12/2020  A/P: vitamin d hopefully stable- update today- continue current meds   #Osteoporosis- patient has not tolerated Prolia due to achiness and has had a lot of dental issues so not thought ideal candidate for bisphosphonates -offered repeat today- declines as likely wouldn't take medicine  Recommended follow up: No follow-ups on file. Future Appointments  Date Time Provider [place]  05/19/2021  7:50 AM [facility] 2 [facility]  06/01/2021  8:30 AM [facility] HEALTH COACH [facility]  07/19/2021  1:45 PM Richardson Dopp T, PA-C [facility]    Lab/Order associations: half of banana this AM   ICD-10-CM   1. Mixed hyperlipidemia  E78.2 Lipid panel    2. Hypothyroidism, unspecified type  E03.9 TSH    3. Essential hypertension  I10 Comprehensive metabolic panel    4. Vitamin D deficiency  E55.9 VITAMIN D 25 Hydroxy (Vit-D Deficiency, Fractures)    5. Osteoporosis, unspecified osteoporosis type, unspecified pathological fracture presence  M81.0      Meds ordered this encounter  Medications   amLODipine (NORVASC) 5 MG tablet    Sig: Take 1.5 tablets (7.5 mg total) by mouth daily.    Dispense:  135 tablet    Refill:  3    I,Harris Phan,acting as a scribe for Garret Reddish, MD.,have documented all relevant documentation on the behalf of Garret Reddish, MD,as directed by  Garret Reddish, MD while in the presence of Garret Reddish, MD.  I, Garret Reddish, MD, have reviewed all documentation for this visit. The documentation on 04/20/21 for the exam, diagnosis, procedures, and orders are all accurate and complete.  Return precautions advised.  Garret Reddish, MD

## 2021-04-25 ENCOUNTER — Other Ambulatory Visit: Payer: Self-pay | Admitting: Family Medicine

## 2021-05-12 ENCOUNTER — Telehealth: Payer: Self-pay

## 2021-05-12 NOTE — Telephone Encounter (Signed)
Patient is calling in stating that Dr.Hunter increased her BP medication and has noticed that her legs and feet are really swollen and wants to know what she should do. Patient asked if she doesn't answer to please leave a message.

## 2021-05-12 NOTE — Telephone Encounter (Signed)
If her blood pressures are well controlled I am okay with her going back to amlodipine 5 mg and then updating [place] with home blood pressures in 2 weeks.  She can elevate legs to try to help with swelling and make sure to eat a low-salt diet

## 2021-05-12 NOTE — Telephone Encounter (Signed)
See below

## 2021-05-12 NOTE — Telephone Encounter (Signed)
Spoke to pt  Asked pt what her blood pressure was this morning? Pt said 135/75, pulse 68. Pt says she is checking with 2 different machines and the one on her wrist reads higher. Pt said when she comes in on  the 22 she will bring them both so they can be calibrated.  Sounds like blood pressures are well controlled, Dr. Yong Channel said he is okay with you going back to amlodipine 5 mg and then updating [place] with home blood pressures in 2 weeks. She can elevate legs to try to help with swelling and make sure to eat a low-salt diet. Pt verbalized understanding. Told pt if blood pressure is elevated 140 90 or higher to please call. Pt verbalized understanding.

## 2021-05-14 ENCOUNTER — Other Ambulatory Visit: Payer: Self-pay | Admitting: Family Medicine

## 2021-05-19 ENCOUNTER — Ambulatory Visit
Admission: RE | Admit: 2021-05-19 | Discharge: 2021-05-19 | Disposition: A | Discharge status: Home / Self Care | Payer: Medicare PPO | Source: Ambulatory Visit | Attending: Family Medicine | Admitting: Family Medicine

## 2021-05-19 ENCOUNTER — Other Ambulatory Visit: Payer: Self-pay | Admitting: Family Medicine

## 2021-05-19 ENCOUNTER — Other Ambulatory Visit: Payer: Self-pay

## 2021-05-19 DIAGNOSIS — Z1231 Encounter for screening mammogram for malignant neoplasm of breast: Secondary | ICD-10-CM | POA: Diagnosis not present

## 2021-05-30 NOTE — Progress Notes (Signed)
Phone [phone] In person visit   Subjective:   [name] is a 80 y.o. year old very pleasant female patient who presents for/with See problem oriented charting Chief Complaint  Patient presents with   Hypertension   This visit occurred during the SARS-CoV-2 public health emergency.  Safety protocols were in place, including screening questions prior to the visit, additional usage of staff PPE, and extensive cleaning of exam room while observing appropriate contact time as indicated for disinfecting solutions.   Past Medical History-  Patient Active Problem List   Diagnosis Date Noted   CAD (coronary artery disease) s/p stent mCx 05/2019 06/02/2019    Priority: High   Grieving 02/18/2017    Priority: High   Osteoporosis 11/05/2016    Priority: High   Aortic atherosclerosis ([place]) 09/15/2020    Priority: Medium   Chest pain 12/29/2018    Priority: Medium   Primary open-angle glaucoma 11/20/2017    Priority: Medium   Hypothyroidism 07/01/2013    Priority: Medium   Hypertension 11/02/2012    Priority: Medium   Hyperlipidemia 07/24/2011    Priority: Medium   hx: breast cancer, IDC (Microinvasive) w DCIS, 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 - 07/10/2011    Priority: Medium   History of adenomatous polyp of colon 09/30/2018    Priority: Low   Medicare annual wellness visit, subsequent 09/26/2014    Priority: Low   Vitamin D deficiency 11/02/2012    Priority: Low   Obesity (BMI 30-39.9) 07/24/2011    Priority: Low   PAD (peripheral artery disease) ([place]) 09/15/2020   Statin myopathy 07/01/2019   Hypokalemia 06/02/2019   NSTEMI (non-ST elevated myocardial infarction) ([place]) 05/31/2019   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     Bempedoic Acid 180 MG TABS Take 1 tablet by mouth daily. 90 tablet 3   Bimatoprost (LUMIGAN OP) Place 1 drop into both eyes at bedtime.      chlorthalidone (HYGROTON) 25 MG tablet TAKE 1 TABLET ONCE DAILY. 90 tablet 0   Cholecalciferol (VITAMIN D-3) 5000 UNITS TABS Take 5,000 mg by mouth daily.     dorzolamide-timolol (COSOPT) 22.3-6.8 MG/ML ophthalmic solution Place 1 drop into both eyes 2 (two) times daily.      famotidine (PEPCID) 20 MG tablet TAKE 1 TABLET BY MOUTH TWICE DAILY. 180 tablet 3   icosapent Ethyl (VASCEPA) 1 g capsule Take 1 capsule (1 g total) by mouth 2 (two) times daily. 60 capsule 11   levothyroxine (SYNTHROID) 50 MCG tablet TAKE ONE TABLET BEFORE BREAKFAST. 90 tablet 0   metoprolol tartrate (LOPRESSOR) 25 MG tablet TAKE ONE TABLET BY MOUTH TWICE DAILY 180 tablet 0   nitroGLYCERIN (NITROSTAT) 0.4 MG SL tablet Place 1 tablet (0.4 mg total) under the tongue every 5 (five) minutes x 3 doses as needed for chest pain. 25 tablet 3   pilocarpine (PILOCAR) 1 % ophthalmic solution Place 1 drop into both eyes 2 (two) times daily.      Polyethyl Glycol-Propyl Glycol (SYSTANE OP) Place 1-2 drops into both eyes as needed (for dry eyes).      potassium  chloride SA (KLOR-CON) 20 MEQ tablet Take 1 tablet (20 mEq total) by mouth daily. 90 tablet 3   Probiotic Product (PROBIOTIC DAILY) CAPS Take 1 capsule by mouth daily. Reported on 10/10/2015     REPATHA SURECLICK 741 MG/ML SOAJ INJECT CONTENTS OF 1 PEN (140MG ) INTO SKIN EVERY 14 DAYS. 2 mL 11   ticagrelor (BRILINTA) 90 MG TABS tablet Take 1 tablet (90 mg total) by mouth 2 (two) times daily. 60 tablet 11   amLODipine (NORVASC) 5 MG tablet Take 1 tablet (5 mg total) by mouth daily. 90 tablet 3   No current facility-administered medications for this visit.     Objective:  BP 132/72    Pulse 72   Temp 98.1 F (36.7 C) (Temporal)   Wt 151 lb 9.6 oz (68.8 kg)   SpO2 98%   BMI 26.85 kg/m  Gen: NAD, resting comfortably CV: RRR no murmurs rubs or gallops Lungs: CTAB no crackles, wheeze, rhonchi Abdomen: soft/nontender/nondistended/normal bowel sounds. No rebound or guarding.  Ext: trace edema Skin: warm, dry     Assessment and Plan   #social update- has done a few visits now with hospice grief counseling- finds helpful . Has been back in church as well. Getting hearing aids.   #CAD-follows with Dr. Harrington Challenger on brilinta and aspirin- compliant today #PAD listed by Dr. Mable Fill does have aortic atherosclerosis #hyperlipidemia S: Medication: Repeatha 140 mg every 2 weeks and Vascepa 1 g-also on bempedoic acid-started with clear study but we have continued this/restarted last visit with LDL being high  No chest pain or shortness of breath.  No claudication in regards to PAD Lab Results  Component Value Date   CHOL 174 04/20/2021   HDL 70.90 04/20/2021   LDLCALC 83 04/20/2021   LDLDIRECT 114.0 04/29/2019   TRIG 101.0 04/20/2021   CHOLHDL 2 04/20/2021  A/P: Mild poor control of bempedoic acid-we restarted this last month-discussed repeating labs today.  For now continue Repatha, Vascepa, bempedoic acid  CAD asymptomatic-continue current medication  #hypertension S: medication: Metoprolol 25 mg twice daily, amlodipine 5 mg-increased last visit but had swelling os went back down to 5 mg  (swelling went down), chlorthalidone 25 mg with potassium every other day - Patient continues to work with nutritionist every few weeks and is a member at [employer] well for exercise- 3 days a week or bike at home. Down 1 lb from last visit -some frequent urination at night- up to 3-4 times- wondered if could be bp meds Home readings #s: 103-142/55-75. Average certainly less than 638/45 BP Readings from Last 3 Encounters:  06/01/21 132/72  04/20/21 (!) 141/64  03/02/21 140/68  A/P:  blood pressure much improved even though she went back down on amlodipine- will update rx. She will let me know if any issues with BP increasing -with frequent urination/nocturia doubt related to BP meds as takes chlorthalidone in AM- will check UA and culture  Recommended follow up: 3 month follow up. 6 month CPE Future Appointments  Date Time Provider [place]  06/01/2021  8:45 AM [facility]  07/19/2021  1:45 PM Richardson Dopp T, PA-C [facility]   Lab/Order associations:   ICD-10-CM   1. Essential hypertension  I10     2. Mixed hyperlipidemia  E78.2 LDL cholesterol, direct    Comprehensive metabolic panel    3. Coronary artery disease involving native coronary artery of native heart without angina pectoris  I25.10     4. Nocturia  R35.1 POCT  Urinalysis Dipstick (Automated)    Urine Culture     I,Harris Phan,acting as a scribe for Garret Reddish, MD.,have documented all relevant documentation on the behalf of Garret Reddish, MD,as directed by  Garret Reddish, MD while in the presence of Garret Reddish, MD.  I, Garret Reddish, MD, have reviewed all documentation for this visit. The documentation on 06/01/21 for the exam, diagnosis, procedures, and orders are all accurate and complete.   Return precautions advised.  Garret Reddish, MD

## 2021-06-01 ENCOUNTER — Ambulatory Visit: Payer: Medicare PPO | Admitting: Family Medicine

## 2021-06-01 ENCOUNTER — Encounter: Payer: Self-pay | Admitting: Family Medicine

## 2021-06-01 ENCOUNTER — Other Ambulatory Visit: Payer: Self-pay

## 2021-06-01 ENCOUNTER — Ambulatory Visit (INDEPENDENT_AMBULATORY_CARE_PROVIDER_SITE_OTHER): Payer: Medicare PPO

## 2021-06-01 VITALS — BP 132/72 | HR 72 | Temp 98.1°F | Wt 151.6 lb

## 2021-06-01 DIAGNOSIS — Z0000 Encounter for general adult medical examination without abnormal findings: Secondary | ICD-10-CM | POA: Diagnosis not present

## 2021-06-01 DIAGNOSIS — I2510 Atherosclerotic heart disease of native coronary artery without angina pectoris: Secondary | ICD-10-CM

## 2021-06-01 DIAGNOSIS — E782 Mixed hyperlipidemia: Secondary | ICD-10-CM

## 2021-06-01 DIAGNOSIS — R351 Nocturia: Secondary | ICD-10-CM

## 2021-06-01 DIAGNOSIS — E039 Hypothyroidism, unspecified: Secondary | ICD-10-CM

## 2021-06-01 DIAGNOSIS — Z23 Encounter for immunization: Secondary | ICD-10-CM | POA: Diagnosis not present

## 2021-06-01 DIAGNOSIS — M810 Age-related osteoporosis without current pathological fracture: Secondary | ICD-10-CM

## 2021-06-01 DIAGNOSIS — I10 Essential (primary) hypertension: Secondary | ICD-10-CM | POA: Diagnosis not present

## 2021-06-01 DIAGNOSIS — K219 Gastro-esophageal reflux disease without esophagitis: Secondary | ICD-10-CM

## 2021-06-01 DIAGNOSIS — E559 Vitamin D deficiency, unspecified: Secondary | ICD-10-CM

## 2021-06-01 LAB — COMPREHENSIVE METABOLIC PANEL
ALT: 17 U/L (ref 0–35)
AST: 20 U/L (ref 0–37)
Albumin: 4.5 g/dL (ref 3.5–5.2)
Alkaline Phosphatase: 43 U/L (ref 39–117)
BUN: 22 mg/dL (ref 6–23)
CO2: 30 mEq/L (ref 19–32)
Calcium: 9.5 mg/dL (ref 8.4–10.5)
Chloride: 101 mEq/L (ref 96–112)
Creatinine, Ser: 0.98 mg/dL (ref 0.40–1.20)
GFR: 54.78 mL/min — ABNORMAL LOW (ref >60.00)
Glucose, Bld: 88 mg/dL (ref 70–99)
Potassium: 3.4 mEq/L — ABNORMAL LOW (ref 3.5–5.1)
Sodium: 140 mEq/L (ref 135–145)
Total Bilirubin: 0.8 mg/dL (ref 0.2–1.2)
Total Protein: 6.9 g/dL (ref 6.0–8.3)

## 2021-06-01 LAB — POC URINALSYSI DIPSTICK (AUTOMATED)
Bilirubin, UA: NEGATIVE
Blood, UA: NEGATIVE
Glucose, UA: NEGATIVE
Ketones, UA: NEGATIVE
Leukocytes, UA: NEGATIVE
Nitrite, UA: NEGATIVE
Protein, UA: NEGATIVE
Spec Grav, UA: 1.025 (ref 1.010–1.025)
Urobilinogen, UA: 0.2 E.U./dL
pH, UA: 5.5 (ref 5.0–8.0)

## 2021-06-01 LAB — LDL CHOLESTEROL, DIRECT: Direct LDL: 54 mg/dL

## 2021-06-01 MED ORDER — AMLODIPINE BESYLATE 5 MG PO TABS: 5.0000 mg | ORAL_TABLET | Freq: Every day | ORAL | 3 refills | Status: DC

## 2021-06-01 NOTE — Addendum Note (Signed)
Addended by: Loura Back on: 06/01/2021 10:01 AM   Modules accepted: Orders

## 2021-06-01 NOTE — Addendum Note (Signed)
Addended by: Loura Back on: 06/01/2021 10:02 AM   Modules accepted: Orders

## 2021-06-01 NOTE — Patient Instructions (Addendum)
Health Maintenance, Female Adopting a healthy lifestyle and getting preventive care are important in promoting health and wellness. Ask your health care provider about: The right schedule for you to have regular tests and exams. Things you can do on your own to prevent diseases and keep yourself healthy. What should I know about diet, weight, and exercise? Eat a healthy diet  Eat a diet that includes plenty of vegetables, fruits, low-fat dairy products, and lean protein. Do not eat a lot of foods that are high in solid fats, added sugars, or sodium. Maintain a healthy weight Body mass index (BMI) is used to identify weight problems. It estimates body fat based on height and weight. Your health care provider can help determine your BMI and help you achieve or maintain a healthy weight. Get regular exercise Get regular exercise. This is one of the most important things you can do for your health. Most adults should: Exercise for at least 150 minutes each week. The exercise should increase your heart rate and make you sweat (moderate-intensity exercise). Do strengthening exercises at least twice a week. This is in addition to the moderate-intensity exercise. Spend less time sitting. Even light physical activity can be beneficial. Watch cholesterol and blood lipids Have your blood tested for lipids and cholesterol at 80 years of age, then have this test every 5 years. Have your cholesterol levels checked more often if: Your lipid or cholesterol levels are high. You are older than 80 years of age. You are at high risk for heart disease. What should I know about cancer screening? Depending on your health history and family history, you may need to have cancer screening at various ages. This may include screening for: Breast cancer. Cervical cancer. Colorectal cancer. Skin cancer. Lung cancer. What should I know about heart disease, diabetes, and high blood pressure? Blood pressure and heart  disease High blood pressure causes heart disease and increases the risk of stroke. This is more likely to develop in people who have high blood pressure readings, are of African descent, or are overweight. Have your blood pressure checked: Every 3-5 years if you are 18-39 years of age. Every year if you are 40 years old or older. Diabetes Have regular diabetes screenings. This checks your fasting blood sugar level. Have the screening done: Once every three years after age 40 if you are at a normal weight and have a low risk for diabetes. More often and at a younger age if you are overweight or have a high risk for diabetes. What should I know about preventing infection? Hepatitis B If you have a higher risk for hepatitis B, you should be screened for this virus. Talk with your health care provider to find out if you are at risk for hepatitis B infection. Hepatitis C Testing is recommended for: Everyone born from 1945 through 1965. Anyone with known risk factors for hepatitis C. Sexually transmitted infections (STIs) Get screened for STIs, including gonorrhea and chlamydia, if: You are sexually active and are younger than 80 years of age. You are older than 80 years of age and your health care provider tells you that you are at risk for this type of infection. Your sexual activity has changed since you were last screened, and you are at increased risk for chlamydia or gonorrhea. Ask your health care provider if you are at risk. Ask your health care provider about whether you are at high risk for HIV. Your health care provider may recommend a prescription medicine   to help prevent HIV infection. If you choose to take medicine to prevent HIV, you should first get tested for HIV. You should then be tested every 3 months for as long as you are taking the medicine. Pregnancy If you are about to stop having your period (premenopausal) and you may become pregnant, seek counseling before you get  pregnant. Take 400 to 800 micrograms (mcg) of folic acid every day if you become pregnant. Ask for birth control (contraception) if you want to prevent pregnancy. Osteoporosis and menopause Osteoporosis is a disease in which the bones lose minerals and strength with aging. This can result in bone fractures. If you are 65 years old or older, or if you are at risk for osteoporosis and fractures, ask your health care provider if you should: Be screened for bone loss. Take a calcium or vitamin D supplement to lower your risk of fractures. Be given hormone replacement therapy (HRT) to treat symptoms of menopause. Follow these instructions at home: Lifestyle Do not use any products that contain nicotine or tobacco, such as cigarettes, e-cigarettes, and chewing tobacco. If you need help quitting, ask your health care provider. Do not use street drugs. Do not share needles. Ask your health care provider for help if you need support or information about quitting drugs. Alcohol use Do not drink alcohol if: Your health care provider tells you not to drink. You are pregnant, may be pregnant, or are planning to become pregnant. If you drink alcohol: Limit how much you use to 0-1 drink a day. Limit intake if you are breastfeeding. Be aware of how much alcohol is in your drink. In the [place] one drink equals one 12 oz bottle of beer (355 mL), one 5 oz glass of wine (148 mL), or one 1 oz glass of hard liquor (44 mL). General instructions Schedule regular health, dental, and eye exams. Stay current with your vaccines. Tell your health care provider if: You often feel depressed. You have ever been abused or do not feel safe at home. Summary Adopting a healthy lifestyle and getting preventive care are important in promoting health and wellness. Follow your health care provider's instructions about healthy diet, exercising, and getting tested or screened for diseases. Follow your health care provider's  instructions on monitoring your cholesterol and blood pressure. This information is not intended to replace advice given to you by your health care provider. Make sure you discuss any questions you have with your health care provider. Document Revised: 11/04/2020 Document Reviewed: 08/20/2018 Elsevier Patient Education  2022 Elsevier Inc.  Health Maintenance, Female Adopting a healthy lifestyle and getting preventive care are important in promoting health and wellness. Ask your health care provider about: The right schedule for you to have regular tests and exams. Things you can do on your own to prevent diseases and keep yourself healthy. What should I know about diet, weight, and exercise? Eat a healthy diet  Eat a diet that includes plenty of vegetables, fruits, low-fat dairy products, and lean protein. Do not eat a lot of foods that are high in solid fats, added sugars, or sodium. Maintain a healthy weight Body mass index (BMI) is used to identify weight problems. It estimates body fat based on height and weight. Your health care provider can help determine your BMI and help you achieve or maintain a healthy weight. Get regular exercise Get regular exercise. This is one of the most important things you can do for your health. Most adults should: Exercise for   at least 150 minutes each week. The exercise should increase your heart rate and make you sweat (moderate-intensity exercise). Do strengthening exercises at least twice a week. This is in addition to the moderate-intensity exercise. Spend less time sitting. Even light physical activity can be beneficial. Watch cholesterol and blood lipids Have your blood tested for lipids and cholesterol at 80 years of age, then have this test every 5 years. Have your cholesterol levels checked more often if: Your lipid or cholesterol levels are high. You are older than 80 years of age. You are at high risk for heart disease. What should I know  about cancer screening? Depending on your health history and family history, you may need to have cancer screening at various ages. This may include screening for: Breast cancer. Cervical cancer. Colorectal cancer. Skin cancer. Lung cancer. What should I know about heart disease, diabetes, and high blood pressure? Blood pressure and heart disease High blood pressure causes heart disease and increases the risk of stroke. This is more likely to develop in people who have high blood pressure readings, are of African descent, or are overweight. Have your blood pressure checked: Every 3-5 years if you are 18-39 years of age. Every year if you are 40 years old or older. Diabetes Have regular diabetes screenings. This checks your fasting blood sugar level. Have the screening done: Once every three years after age 40 if you are at a normal weight and have a low risk for diabetes. More often and at a younger age if you are overweight or have a high risk for diabetes. What should I know about preventing infection? Hepatitis B If you have a higher risk for hepatitis B, you should be screened for this virus. Talk with your health care provider to find out if you are at risk for hepatitis B infection. Hepatitis C Testing is recommended for: Everyone born from 1945 through 1965. Anyone with known risk factors for hepatitis C. Sexually transmitted infections (STIs) Get screened for STIs, including gonorrhea and chlamydia, if: You are sexually active and are younger than 80 years of age. You are older than 80 years of age and your health care provider tells you that you are at risk for this type of infection. Your sexual activity has changed since you were last screened, and you are at increased risk for chlamydia or gonorrhea. Ask your health care provider if you are at risk. Ask your health care provider about whether you are at high risk for HIV. Your health care provider may recommend a prescription  medicine to help prevent HIV infection. If you choose to take medicine to prevent HIV, you should first get tested for HIV. You should then be tested every 3 months for as long as you are taking the medicine. Pregnancy If you are about to stop having your period (premenopausal) and you may become pregnant, seek counseling before you get pregnant. Take 400 to 800 micrograms (mcg) of folic acid every day if you become pregnant. Ask for birth control (contraception) if you want to prevent pregnancy. Osteoporosis and menopause Osteoporosis is a disease in which the bones lose minerals and strength with aging. This can result in bone fractures. If you are 65 years old or older, or if you are at risk for osteoporosis and fractures, ask your health care provider if you should: Be screened for bone loss. Take a calcium or vitamin D supplement to lower your risk of fractures. Be given hormone replacement therapy (HRT) to   treat symptoms of menopause. Follow these instructions at home: Lifestyle Do not use any products that contain nicotine or tobacco, such as cigarettes, e-cigarettes, and chewing tobacco. If you need help quitting, ask your health care provider. Do not use street drugs. Do not share needles. Ask your health care provider for help if you need support or information about quitting drugs. Alcohol use Do not drink alcohol if: Your health care provider tells you not to drink. You are pregnant, may be pregnant, or are planning to become pregnant. If you drink alcohol: Limit how much you use to 0-1 drink a day. Limit intake if you are breastfeeding. Be aware of how much alcohol is in your drink. In the [place] one drink equals one 12 oz bottle of beer (355 mL), one 5 oz glass of wine (148 mL), or one 1 oz glass of hard liquor (44 mL). General instructions Schedule regular health, dental, and eye exams. Stay current with your vaccines. Tell your health care provider if: You often feel  depressed. You have ever been abused or do not feel safe at home. Summary Adopting a healthy lifestyle and getting preventive care are important in promoting health and wellness. Follow your health care provider's instructions about healthy diet, exercising, and getting tested or screened for diseases. Follow your health care provider's instructions on monitoring your cholesterol and blood pressure. This information is not intended to replace advice given to you by your health care provider. Make sure you discuss any questions you have with your health care provider. Document Revised: 11/04/2020 Document Reviewed: 08/20/2018 Elsevier Patient Education  2022 Elsevier Inc.  

## 2021-06-01 NOTE — Patient Instructions (Addendum)
Health Maintenance Due  Topic Date Due   COVID-19 Vaccine (4 - Booster for [employer] series)  Recommend getting Omicron variant specific booster only at your local pharmacy! Please let us know the date of when you have received this. 10/18/2020   INFLUENZA VACCINE  High Dose Flu shot  04/10/2021   Please stop by lab before you go for urine and blood-work If you have mychart- we will send your results within 3 business days of [place] receiving them.  If you do not have mychart- we will call you about results within 5 business days of [place] receiving them.  *please also note that you will see labs on mychart as soon as they post. I will later go in and write notes on them- will say "notes from Dr. Yong Channel"  Thank you for logging your home blood pressure readings and bringing them in today - it is very much appreciated!  You blood pressure has improved significantly - please let me know if you experience any issues at any moment.  I am glad to hear you had good report with the audiologist and will get your hearing aids soon!  I want to congratulate you on going down 1-lb from your last visit!  Recommended follow up: Return in about 3 months (around 08/31/2021) for a follow-up and also schedule next available physical 6 months out at the front desk.

## 2021-06-01 NOTE — Progress Notes (Signed)
Subjective:   [name] is a 80 y.o. female who presents for an Initial Medicare Annual Wellness Visit.   Review of Systems    Defer to PCP        Objective:    There were no vitals filed for this visit. There is no height or weight on file to calculate BMI.  Advanced Directives 06/01/2021 02/28/2021 12/30/2020 05/26/2020 05/31/2019 05/31/2019 04/29/2019  Does Patient Have a Medical Advance Directive? Yes No Yes Yes Yes No Yes  Type of Paramedic of [place];Living will - [place];Living will - [place];Living will - [place];Living will  Does patient want to make changes to medical advance directive? Yes (ED - Information included in AVS) - - - No - Patient declined - No - Patient declined  Copy of [place] in Chart? No - copy requested - No - copy requested - No - copy requested - No - copy requested  Would patient like information on creating a medical advance directive? - No - Patient declined - - No - Patient declined No - Patient declined -    Current Medications (verified) Outpatient Encounter Medications as of 06/01/2021  Medication Sig   amLODipine (NORVASC) 5 MG tablet Take 1 tablet (5 mg total) by mouth daily.   aspirin EC 81 MG tablet Take 81 mg by mouth daily.   Bempedoic Acid 180 MG TABS Take 1 tablet by mouth daily.   Bimatoprost (LUMIGAN OP) Place 1 drop into both eyes at bedtime.    chlorthalidone (HYGROTON) 25 MG tablet TAKE 1 TABLET ONCE DAILY.   Cholecalciferol (VITAMIN D-3) 5000 UNITS TABS Take 5,000 mg by mouth daily.   dorzolamide-timolol (COSOPT) 22.3-6.8 MG/ML ophthalmic solution Place 1 drop into both eyes 2 (two) times daily.    famotidine (PEPCID) 20 MG tablet TAKE 1 TABLET BY MOUTH TWICE DAILY.   icosapent Ethyl (VASCEPA) 1 g capsule Take 1 capsule (1 g total) by mouth 2 (two) times daily.   levothyroxine (SYNTHROID) 50 MCG tablet TAKE ONE TABLET  BEFORE BREAKFAST.   metoprolol tartrate (LOPRESSOR) 25 MG tablet TAKE ONE TABLET BY MOUTH TWICE DAILY   nitroGLYCERIN (NITROSTAT) 0.4 MG SL tablet Place 1 tablet (0.4 mg total) under the tongue every 5 (five) minutes x 3 doses as needed for chest pain.   pilocarpine (PILOCAR) 1 % ophthalmic solution Place 1 drop into both eyes 2 (two) times daily.    Polyethyl Glycol-Propyl Glycol (SYSTANE OP) Place 1-2 drops into both eyes as needed (for dry eyes).    potassium chloride SA (KLOR-CON) 20 MEQ tablet Take 1 tablet (20 mEq total) by mouth daily.   Probiotic Product (PROBIOTIC DAILY) CAPS Take 1 capsule by mouth daily. Reported on 9/47/0962   REPATHA SURECLICK 836 MG/ML SOAJ INJECT CONTENTS OF 1 PEN (140MG ) INTO SKIN EVERY 14 DAYS.   ticagrelor (BRILINTA) 90 MG TABS tablet Take 1 tablet (90 mg total) by mouth 2 (two) times daily.   No facility-administered encounter medications on file as of 06/01/2021.    Allergies (verified) Adhesive [tape] and Rosuvastatin   History: Past Medical History:  Diagnosis Date   Abnormal thyroid function test 07/01/2013   Anxiety    Arthritis    Cancer ([place])    Coronary artery disease    Dermatitis 11/25/2013   Glaucoma 11/02/2012   HTN (hypertension) 11/02/2012   hx: breast cancer, IDC (Microinvasive) w DCIS, 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 - 07/10/2011  Hyperlipidemia    Hypothyroid    Obesity    Past Surgical History:  Procedure Laterality Date   AUGMENTATION MAMMAPLASTY Left    05/2006   BREAST SURGERY  10/2005   Left Mastectomy, reconstruction, reduction on right. Dr. Harlow Mares   CARDIAC CATHETERIZATION     COLONOSCOPY     CORONARY STENT INTERVENTION N/A 06/01/2019   Procedure: CORONARY STENT INTERVENTION;  Surgeon: Sherren Mocha, MD;  Location: [facility];  Service: Cardiovascular;  Laterality: N/A;   CRYOTHERAPY     GYN for CIN III   DILATION AND CURETTAGE OF UTERUS     EXCISION MORTON'S NEUROMA     right foot   EYE SURGERY Right 05/08/2018   LEFT  HEART CATH AND CORONARY ANGIOGRAPHY N/A 06/01/2019   Procedure: LEFT HEART CATH AND CORONARY ANGIOGRAPHY;  Surgeon: Sherren Mocha, MD;  Location: [facility];  Service: Cardiovascular;  Laterality: N/A;   MASTECTOMY     REDUCTION MAMMAPLASTY Right    Family History  Problem Relation Age of Onset   Pneumonia Mother    Hyperlipidemia Mother    Parkinsonism Mother    CAD Mother    Other Father        bilateral subdural hematoma   Colon cancer Paternal Grandmother    Hyperlipidemia Sister    Prostate cancer Neg Hx    Breast cancer Neg Hx    Heart disease Neg Hx    Diabetes Neg Hx    Social History   Socioeconomic History   Marital status: Widowed    Spouse name: Not on file   Number of children: 1   Years of education: Not on file   Highest education level: Not on file  Occupational History   Occupation: retired  Tobacco Use   Smoking status: Never   Smokeless tobacco: Never  Vaping Use   Vaping Use: Never used  Substance and Sexual Activity   Alcohol use: Yes    Comment: Rare   Drug use: No   Sexual activity: Not on file  Other Topics Concern   Not on file  Social History Narrative   Married 1981. 1 daughter, 2 step sons. 4 grandchildren      Oncologist      Hobbies: going out to eat, activities with church including cancer support group at church   Social Determinants of Health   Financial Resource Strain: Not on file  Food Insecurity: Not on file  Transportation Needs: No Transportation Needs   Lack of Transportation (Medical): No   Lack of Transportation (Non-Medical): No  Physical Activity: Sufficiently Active   Days of Exercise per Week: 3 days   Minutes of Exercise per Session: 60 min  Stress: No Stress Concern Present   Feeling of Stress : Not at all  Social Connections: Not on file    Tobacco Counseling Counseling given: Not Answered   Clinical Intake:  Pre-visit preparation completed: No  Pain : No/denies pain      Nutritional Risks: None     Diabetic?No         Activities of Daily Living In your present state of health, do you have any difficulty performing the following activities: 06/01/2021  Hearing? Y  Vision? N  Difficulty concentrating or making decisions? N  Walking or climbing stairs? N  Dressing or bathing? N  Doing errands, shopping? N  Some recent data might be hidden    Patient Care Team: Marin Olp, MD as PCP - General (  Family Medicine) Fay Records, MD as PCP - Cardiology (Cardiology) Magrinat, Virgie Dad, MD (Hematology and Oncology) Neldon Mc, MD (General Surgery) Crissie Reese, MD (Plastic Surgery) Clent Jacks, MD as Consulting Physician (Ophthalmology)  Indicate any recent Medical Services you may have received from other than [facility] providers in the past year (date may be approximate).     Assessment:   This is a routine wellness examination for [place].  Hearing/Vision screen No results found.  Dietary issues and exercise activities discussed:     Goals Addressed   None   Depression Screen PHQ 2/9 Scores 04/20/2021 01/18/2021 10/03/2020 05/26/2020 05/12/2020 10/15/2019 09/17/2019  PHQ - 2 Score 0 1 0 1 0 0 0  PHQ- 9 Score - 3 - - 0 1 -    Fall Risk Fall Risk  04/20/2021 05/26/2020 05/12/2020 07/27/2019 04/29/2019  Falls in the past year? 0 1 0 0 0  Number falls in past yr: 0 1 0 0 0  Injury with Fall? 0 0 0 0 0  Risk for fall due to : No Fall Risks Impaired vision;Impaired balance/gait - - -  Follow up Falls evaluation completed Falls prevention discussed - - -    FALL RISK PREVENTION PERTAINING TO THE HOME:  Any stairs in or around the home? No  If so, are there any without handrails? No  Home free of loose throw rugs in walkways, pet beds, electrical cords, etc? Yes  Adequate lighting in your home to reduce risk of falls? Yes   ASSISTIVE DEVICES UTILIZED TO PREVENT FALLS:  Life alert? No  Use of a cane, walker or w/c? No  Grab bars in  the bathroom? No  Shower chair or bench in shower? No  Elevated toilet seat or a handicapped toilet? No   TIMED UP AND GO:  Was the test performed?  N/A .  Length of time to ambulate 10 feet: N/A sec.     Cognitive Function: MMSE - Mini Mental State Exam 03/06/2018 11/23/2016  Not completed: (No Data) (No Data)     6CIT Screen 06/01/2021 05/26/2020  What Year? 0 points 0 points  What month? 0 points 0 points  What time? 0 points -  Count back from 20 0 points 0 points  Months in reverse 0 points 0 points  Repeat phrase 0 points 0 points  Total Score 0 -    Immunizations Immunization History  Administered Date(s) Administered   Fluad Quad(high Dose 65+) 04/29/2019, 05/12/2020, 06/01/2021   Influenza Split 07/24/2011   Influenza Whole 06/15/2009   Influenza, High Dose Seasonal PF 06/29/2013, 07/05/2015, 05/10/2016, 05/21/2017, 06/18/2018   Influenza,inj,Quad PF,6+ Mos 06/21/2014, 06/14/2016   PFIZER(Purple Top)SARS-COV-2 Vaccination 10/05/2019, 10/26/2019, 06/17/2020   Pneumococcal Conjugate-13 04/15/2014   Pneumococcal Polysaccharide-23 07/17/2000, 10/02/2016   Tdap 07/17/2010   Zoster Recombinat (Shingrix) 10/15/2018, 03/17/2019    TDAP status: Due, Education has been provided regarding the importance of this vaccine. Advised may receive this vaccine at local pharmacy or Health Dept. Aware to provide a copy of the vaccination record if obtained from local pharmacy or Health Dept. Verbalized acceptance and understanding.  Flu Vaccine status: Up to date  Pneumococcal vaccine status: Up to date  Covid-19 vaccine status: Completed vaccines  Qualifies for Shingles Vaccine? Yes   Zostavax completed Yes   Shingrix Completed?: Yes  Screening Tests Health Maintenance  Topic Date Due   COVID-19 Vaccine (4 - Booster for [place] series) 06/17/2021 (Originally 10/18/2020)   TETANUS/TDAP  02/24/2022 (Originally 07/17/2020)  MAMMOGRAM  05/19/2022   COLONOSCOPY (Pts 45-61yrs  Insurance coverage will need to be confirmed)  09/23/2023   INFLUENZA VACCINE  Completed   DEXA SCAN  Completed   Hepatitis C Screening  Completed   Zoster Vaccines- Shingrix  Completed   HPV VACCINES  Aged Out    Health Maintenance  There are no preventive care reminders to display for this patient.  Colorectal cancer screening: Type of screening: Colonoscopy. Completed 09/22/18. Repeat every 5 years  Mammogram status: Completed 05/19/21. Repeat every year    Lung Cancer Screening: (Low Dose CT Chest recommended if Age 73-80 years, 30 pack-year currently smoking OR have quit w/in 15years.) does not qualify.   Lung Cancer Screening Referral: N/A  Additional Screening:  Hepatitis C Screening: does qualify; Completed 05/12/20  Vision Screening: Recommended annual ophthalmology exams for early detection of glaucoma and other disorders of the eye. Is the patient up to date with their annual eye exam?  Yes  Who is the provider or what is the name of the office in which the patient attends annual eye exams? N/A If pt is not established with a provider, would they like to be referred to a provider to establish care? No .   Dental Screening: Recommended annual dental exams for proper oral hygiene  Community Resource Referral / Chronic Care Management: CRR required this visit?  No   CCM required this visit?  No      Plan:     I have personally reviewed and noted the following in the patient's chart:   Medical and social history Use of alcohol, tobacco or illicit drugs  Current medications and supplements including opioid prescriptions. Patient is not currently taking opioid prescriptions. Functional ability and status Nutritional status Physical activity Advanced directives List of other physicians Hospitalizations, surgeries, and ER visits in previous 12 months Vitals Screenings to include cognitive, depression, and falls Referrals and appointments  In addition, I  have reviewed and discussed with patient certain preventive protocols, quality metrics, and best practice recommendations. A written personalized care plan for preventive services as well as general preventive health recommendations were provided to patient.     Loralyn Freshwater, [facility]   06/01/2021   Nurse Notes: Non face to face 35 min    Ms. Menard , Thank you for taking time to come for your Medicare Wellness Visit. I appreciate your ongoing commitment to your health goals. Please review the following plan we discussed and let me know if I can assist you in the future.   These are the goals we discussed:  Goals      Patient Stated     Continue to stay active in lunch Lunch bunch      Patient Stated     Stay healthy and active     Weight (lb) < 200 lb (90.7 kg)     Will go back to weight watchers Will start exercising with spouse for weight bearing Will buddy up with your friend         This is a list of the screening recommended for you and due dates:  Health Maintenance  Topic Date Due   COVID-19 Vaccine (4 - Booster for Pfizer series) 06/17/2021*   Tetanus Vaccine  02/24/2022*   Mammogram  05/19/2022   Colon Cancer Screening  09/23/2023   Flu Shot  Completed   DEXA scan (bone density measurement)  Completed   Hepatitis C Screening: USPSTF Recommendation to screen - Ages 46-79 yo.  Completed  Zoster (Shingles) Vaccine  Completed   HPV Vaccine  Aged Out  *Topic was postponed. The date shown is not the original due date.

## 2021-06-02 LAB — URINE CULTURE
MICRO NUMBER:: 12409815
SPECIMEN QUALITY:: ADEQUATE

## 2021-06-20 ENCOUNTER — Other Ambulatory Visit: Payer: Self-pay | Admitting: Internal Medicine

## 2021-06-21 ENCOUNTER — Telehealth: Payer: Self-pay | Admitting: Internal Medicine

## 2021-06-21 NOTE — Telephone Encounter (Signed)
Spoke with pt and noted SOB last night when was trying to pick up something Also notes not sleeping well for a couple of months Per pt is still grieving the loss of husband and living alone Pt states B/P this am was 133/62 and HR 76 Pt had a  normal myoview in May of this year Per pt will contact PCP to see abut getting something for sleep to see if this helps and in the meantime if S/S worsen will go the ED for eval . Will forward to Dr Harrington Challenger for review and recommendations ./cy

## 2021-06-21 NOTE — Telephone Encounter (Signed)
Reviewed call from patient Contacted her    Does get SOB at times   Not with household chores though   At other times   She questions if panic like Is not sleeping well Still grieving over death of husband   Going to hospice  Recomm:   Encouraged her to get back to [facility] She had been going and liked it    New routines Listen to body for changes   Has appt in Dec with APP  Will try to set up day so I can see her

## 2021-06-21 NOTE — Telephone Encounter (Signed)
See encounter from Dr. Harrington Challenger. She spoke with the pt this morning will close this encounter.

## 2021-06-21 NOTE — Telephone Encounter (Signed)
Pt c/o Shortness Of Breath: STAT if SOB developed within the last 24 hours or pt is noticeably SOB on the phone  1. Are you currently SOB (can you hear that pt is SOB on the phone)?  NO   2. How long have you been experiencing SOB?  About 1 week, on and off  3. Are you SOB when sitting or when up moving around?  Both   4. Are you currently experiencing any other symptoms?  NO

## 2021-06-26 DIAGNOSIS — Z961 Presence of intraocular lens: Secondary | ICD-10-CM | POA: Diagnosis not present

## 2021-06-26 DIAGNOSIS — H401413 Capsular glaucoma with pseudoexfoliation of lens, right eye, severe stage: Secondary | ICD-10-CM | POA: Diagnosis not present

## 2021-06-26 DIAGNOSIS — H401422 Capsular glaucoma with pseudoexfoliation of lens, left eye, moderate stage: Secondary | ICD-10-CM | POA: Diagnosis not present

## 2021-06-26 NOTE — Telephone Encounter (Signed)
[facility] made the pt an appt this Thursday 06/29/21 at 9 am with Dr.Ross.

## 2021-06-26 NOTE — Telephone Encounter (Signed)
Left a message for the pt to be seen by dr. Harrington Challenger on her add on day this week 06/29/21.

## 2021-06-28 NOTE — Progress Notes (Signed)
Cardiology Office Note   Date:  06/28/2021   ID:  [name], DOB 03-20-1941, MRN [id number]  PCP:  Marin Olp, MD  Cardiologist:   Dorris Carnes, MD   Patient presents for f/u of CAD    History of Present Illness: [name] is a 80 y.o. female with a history of HTN, HL and  CAD  She was admitted in April 2020 with CP   CT chest/abd showed extensive atherosclerosis.  Cardac CT angiogram showed no obstructive lesons    In September2020 she was admtted with an NSTEMI.  Cardiac catheterization showed severe single-vessel disease with a critical stenosis of the left circumflex.  She underwent PTCA/DES to this area.  There was nonobstructive disease noted in the LAD.  Plan for dual antiplatelet therapy for 12 months.  The patient was enrolled in the CLEAR trial in 2018  She was seen  by [facility] [place] and Praluent was added along with Vascepa.  She discontinued DHEA and krill oil.    I saw the pt in clinic in Dec 2021  The pt went to ED on 4/22 with chest pressure  Felt noncardiac   Sent home   Has had on/off in spring   I saw her in May   Lexiscan myoview after that was normal     The pt called last week  She said that she gets SOB at times, that she feels washed out    [facility] initially questioned if panic   She admits she is still grieving for her husband       Denies CP with activity    When not having spells she feels OK WOnders if it is her BP     Log of BPs from home 100s to 140s/   No outpatient medications have been marked as taking for the 06/29/21 encounter (Appointment) with Fay Records, MD.     Allergies:   Adhesive [tape] and Rosuvastatin   Past Medical History:  Diagnosis Date   Abnormal thyroid function test 07/01/2013   Anxiety    Arthritis    Cancer ([place])    Coronary artery disease    Dermatitis 11/25/2013   Glaucoma 11/02/2012   HTN (hypertension) 11/02/2012   hx: breast cancer, IDC (Microinvasive) w DCIS, 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 - 07/10/2011    Hyperlipidemia    Hypothyroid    Obesity     Past Surgical History:  Procedure Laterality Date   AUGMENTATION MAMMAPLASTY Left    05/2006   BREAST SURGERY  10/2005   Left Mastectomy, reconstruction, reduction on right. Dr. Harlow Mares   CARDIAC CATHETERIZATION     COLONOSCOPY     CORONARY STENT INTERVENTION N/A 06/01/2019   Procedure: CORONARY STENT INTERVENTION;  Surgeon: Sherren Mocha, MD;  Location: [place];  Service: Cardiovascular;  Laterality: N/A;   CRYOTHERAPY     GYN for CIN III   DILATION AND CURETTAGE OF UTERUS     EXCISION MORTON'S NEUROMA     right foot   EYE SURGERY Right 05/08/2018   LEFT HEART CATH AND CORONARY ANGIOGRAPHY N/A 06/01/2019   Procedure: LEFT HEART CATH AND CORONARY ANGIOGRAPHY;  Surgeon: Sherren Mocha, MD;  Location: [facility];  Service: Cardiovascular;  Laterality: N/A;   MASTECTOMY     REDUCTION MAMMAPLASTY Right      Social History:  The patient  reports that she has never smoked. She has never used smokeless tobacco. She reports current alcohol use. She  reports that she does not use drugs.   Family History:  The patient's family history includes CAD in her mother; Colon cancer in her paternal grandmother; Hyperlipidemia in her mother and sister; Other in her father; Parkinsonism in her mother; Pneumonia in her mother.    ROS:  Please see the history of present illness. All other systems are reviewed and  Negative to the above problem except as noted.    PHYSICAL EXAM: VS:  There were no vitals taken for this visit.  GEN: Pt is a 80 yo female in no acute distress  HEENT: normal  Neck: no JVD  No bruits Cardiac: RRR; no murmurs No LE edema  Respiratory:  CTA  GI: soft, nontender, nondistended, + BS   Skin: warm and dry, no rash Neuro:  Strength and sensation are intact Psych: euthymic mood, full affect   EKG:  EKG is not ordered today.   Lipid Panel    Component Value Date/Time   CHOL 174 04/20/2021 1159   CHOL  131 10/27/2020 0936   TRIG 101.0 04/20/2021 1159   HDL 70.90 04/20/2021 1159   HDL 64 10/27/2020 0936   CHOLHDL 2 04/20/2021 1159   VLDL 20.2 04/20/2021 1159   LDLCALC 83 04/20/2021 1159   LDLCALC 53 10/27/2020 0936   LDLCALC 30 05/12/2020 1136   LDLDIRECT 54.0 06/01/2021 0829      Wt Readings from Last 3 Encounters:  06/01/21 151 lb 9.6 oz (68.8 kg)  04/20/21 152 lb 12.8 oz (69.3 kg)  03/02/21 148 lb 6.4 oz (67.3 kg)    Cardiac Studies  CORONARY STENT INTERVENTION 05/2019  LEFT HEART CATH AND CORONARY ANGIOGRAPHY  Conclusion   1) Severe single vessel CAD with critical stenosis of the mid-circumflex, treated successfully with PTCA and stenting (2.5x12 mm Synergy DES 2) nonobstructive LAD stenosis 3) widely patent RCA, dominant vessel 4) normal LV systolic function   Recommend: DAPT with ASA and ticagrelor x 12 months without interruption. Pt loaded with ticagrelor 180 mg on the cath lab table. DC home tomorrow am if no complications arise.      ASSESSMENT AND PLAN:  1  CAD  I am not convinced of active angina     I do think the patient is anxious, grieving for husband   I encouraged her to get back to working out at [facility]    Will continue to follow    2  HTN  BP is controlled Keep on current regimen     3  HL   In  Aug 2022, LDL 83    HDL 71   4.  PAD atherosclerosis of aorta  5  Depression   Pt is still grieving over loss of husband Reactive   Encouraged increased activity, routine   Current medicines are reviewed at length with the patient today.  The patient does not have concerns regarding medicines.  Signed, Dorris Carnes, MD  06/28/2021 11:03 PM    [address], [place], [place]  [address] Phone: [phone]; Fax: [phone]

## 2021-06-29 ENCOUNTER — Other Ambulatory Visit: Payer: Self-pay

## 2021-06-29 ENCOUNTER — Ambulatory Visit: Payer: Medicare PPO | Admitting: Internal Medicine

## 2021-06-29 ENCOUNTER — Encounter: Payer: Self-pay | Admitting: Internal Medicine

## 2021-06-29 VITALS — BP 124/74 | HR 76 | Ht 63.0 in | Wt 151.8 lb

## 2021-06-29 DIAGNOSIS — I2510 Atherosclerotic heart disease of native coronary artery without angina pectoris: Secondary | ICD-10-CM

## 2021-06-29 MED ORDER — POTASSIUM CHLORIDE CRYS ER 10 MEQ PO TBCR: 10.0000 meq | EXTENDED_RELEASE_TABLET | Freq: Every day | ORAL | 3 refills | Status: DC

## 2021-06-29 MED ORDER — CHLORTHALIDONE 25 MG PO TABS: 12.5000 mg | ORAL_TABLET | Freq: Every day | ORAL | 0 refills | Status: DC

## 2021-06-29 MED ORDER — AMLODIPINE BESYLATE 2.5 MG PO TABS: 2.5000 mg | ORAL_TABLET | Freq: Every day | ORAL | 3 refills | Status: DC

## 2021-06-29 NOTE — Patient Instructions (Addendum)
Medication Instructions:  Stop your Brilinta Change your Amlodipine to 1/2 twice a day  Decrease your Chlorthalidone to 1/2 Decrease your K to 1/2  *If you need a refill on your cardiac medications before your next appointment, please call your pharmacy*   Lab Work: None ordered  If you have labs (blood work) drawn today and your tests are completely normal, you will receive your results only by: [place] (if you have MyChart) OR A paper copy in the mail If you have any lab test that is abnormal or we need to change your treatment, we will call you to review the results.   Testing/Procedures: None ordered   Follow-Up: At [facility], you and your health needs are our priority.  As part of our continuing mission to provide you with exceptional heart care, we have created designated Provider Care Teams.  These Care Teams include your primary Cardiologist (physician) and Advanced Practice Providers (APPs -  Physician Assistants and Nurse Practitioners) who all work together to provide you with the care you need, when you need it.  We recommend signing up for the patient portal called "MyChart".  Sign up information is provided on this After Visit Summary.  MyChart is used to connect with patients for Virtual Visits (Telemedicine).  Patients are able to view lab/test results, encounter notes, upcoming appointments, etc.  Non-urgent messages can be sent to your provider as well.   To learn more about what you can do with MyChart, go to [email or web address].    Your next appointment: Will add you to Dr. Harrington Challenger' list of patients that need to be seen in November 2022

## 2021-07-03 ENCOUNTER — Telehealth: Payer: Self-pay | Admitting: Internal Medicine

## 2021-07-03 NOTE — Telephone Encounter (Signed)
Patient was calling back. She just would like the office to update the rx with her pharmacy. She is worried she will run out of medication if she takes it the way Dr. Harrington Challenger intends

## 2021-07-03 NOTE — Telephone Encounter (Signed)
Pt c/o medication issue:  1. Name of Medication:  amLODipine (NORVASC) 2.5 MG tablet  2. How are you currently taking this medication (dosage and times per day)? 1/2 a tablet in the morning and a 1/2 tablet at night   3. Are you having a reaction (difficulty breathing--STAT)? No   4. What is your medication issue? Willard is calling stating she is needing confirmation on how to take this medication. She states at her last visit Dr. Harrington Challenger advised her to take 1/2 a tablet twice daily, but the prescription she picked up advises to take once daily. She didn't know if this was due to the MG's being changed from 5 to 2.5 MG's, but wanted to confirm she is taking it correctly. Ed is leaving at 1:00 PM and will be back around 3:45 PM.

## 2021-07-03 NOTE — Telephone Encounter (Signed)
Left a message for the pt to call back.  

## 2021-07-04 MED ORDER — AMLODIPINE BESYLATE 2.5 MG PO TABS: 2.5000 mg | ORAL_TABLET | Freq: Two times a day (BID) | ORAL | 3 refills | Status: DC

## 2021-07-04 NOTE — Telephone Encounter (Signed)
Left detailed message that Dr. Harrington Challenger' notes indicate that she was to take amlodipine 2.5 mg twice daily as she had previously taken amlodipine 5 mg daily. The rx was sent for 2.5 mg to be taken once daily. I advised patient in the message that I have updated the Rx and advised her to call back with additional questions or concerns.

## 2021-07-07 ENCOUNTER — Other Ambulatory Visit: Payer: Self-pay | Admitting: Family Medicine

## 2021-07-10 ENCOUNTER — Other Ambulatory Visit (HOSPITAL_BASED_OUTPATIENT_CLINIC_OR_DEPARTMENT_OTHER): Payer: Self-pay

## 2021-07-10 ENCOUNTER — Ambulatory Visit: Payer: Medicare PPO | Attending: Internal Medicine

## 2021-07-10 DIAGNOSIS — Z23 Encounter for immunization: Secondary | ICD-10-CM

## 2021-07-10 MED ORDER — PFIZER COVID-19 VAC BIVALENT 30 MCG/0.3ML IM SUSP
INTRAMUSCULAR | 0 refills | Status: DC
  Filled 2021-07-10: qty 0.3, 1d supply, fill #0

## 2021-07-10 NOTE — Progress Notes (Signed)
   Covid-19 Vaccination Clinic  Name:  [name]    MRN: [id number] DOB: 03-19-1941  07/10/2021  [name] was observed post Covid-19 immunization for 15 minutes without incident. She was provided with Vaccine Information Sheet and instruction to access the V-Safe system.   [name] was instructed to call 911 with any severe reactions post vaccine: Difficulty breathing  Swelling of face and throat  A fast heartbeat  A bad rash all over body  Dizziness and weakness   Immunizations Administered     Name Date Dose VIS Date Route   Pfizer Covid-19 Vaccine Bivalent Booster 07/10/2021 10:02 AM 0.3 mL 05/10/2021 Intramuscular   Manufacturer: [place]   Lot: [id number]   [place]: [phone]

## 2021-07-18 ENCOUNTER — Telehealth: Payer: Self-pay

## 2021-07-18 NOTE — Telephone Encounter (Signed)
Called the pt to reschedule her appt with the APP tomorrow so she can see Dr. Harrington Challenger this Friday 07/21/21...she had requested to see Dr. Harrington Challenger at her last OV in follow up and I advised her at that time that I would work on it for her.  Pt was thankful for my call.

## 2021-07-19 ENCOUNTER — Ambulatory Visit: Payer: Medicare PPO | Admitting: Physician Assistant

## 2021-07-20 NOTE — Progress Notes (Signed)
Cardiology Office Note   Date:  07/21/2021   ID:  [name], DOB 1940/10/21, MRN [id number]  PCP:  Marin Olp, MD  Cardiologist:   Dorris Carnes, MD   Patient presents for f/u of CAD    History of Present Illness: [name] is a 80 y.o. female with a history of HTN, HL and  CAD  She was admitted in April 2020 with CP   CT chest/abd showed extensive atherosclerosis.  Cardac CT angiogram showed no obstructive lesons  In  September2020 she was admtted with an NSTEMI.  Cardiac catheterization showed severe single-vessel disease with a critical stenosis of the left circumflex.  She underwent PTCA/DES to this area.  There was nonobstructive disease noted in the LAD.  Plan for dual antiplatelet therapy for 12 mont The pt went to ED on 12/2020 with chest pressure  Felt noncardiac   Sent home  I saw her in May   [facility] after that was normal    I saw the pt back on 10.20.22.  She complained of feeling washed out, SOB  She was very upset, grieving over death of husband Recomm she try to increase physical activity, get back to exercising  Since seen she is doing better   More up beat  Denies CP   Not as washed out .    BP readings at home though do go low at times   90s/   She will feel week then   Brings in log today   BP ranges 97 to 130s/ 60s to 80s    The pt also notes some LE edema,   Better when on whole h  Current Meds  Medication Sig   aspirin EC 81 MG tablet Take 81 mg by mouth daily.   Bempedoic Acid 180 MG TABS Take 1 tablet by mouth daily.   Bimatoprost (LUMIGAN OP) Place 1 drop into both eyes at bedtime.    Cholecalciferol (VITAMIN D-3) 5000 UNITS TABS Take 5,000 mg by mouth daily.   dorzolamide-timolol (COSOPT) 22.3-6.8 MG/ML ophthalmic solution Place 1 drop into both eyes 2 (two) times daily.    famotidine (PEPCID) 20 MG tablet TAKE 1 TABLET BY MOUTH TWICE DAILY.   icosapent Ethyl (VASCEPA) 1 g capsule Take 1 capsule (1 g total) by mouth 2 (two) times  daily.   levothyroxine (SYNTHROID) 50 MCG tablet TAKE ONE TABLET BEFORE BREAKFAST.   metoprolol tartrate (LOPRESSOR) 25 MG tablet TAKE ONE TABLET BY MOUTH TWICE DAILY   nitroGLYCERIN (NITROSTAT) 0.4 MG SL tablet Place 1 tablet (0.4 mg total) under the tongue every 5 (five) minutes x 3 doses as needed for chest pain.   pilocarpine (PILOCAR) 1 % ophthalmic solution Place 1 drop into both eyes 2 (two) times daily.    Polyethyl Glycol-Propyl Glycol (SYSTANE OP) Place 1-2 drops into both eyes as needed (for dry eyes).    potassium chloride SA (KLOR-CON) 10 MEQ tablet Take 1 tablet (10 mEq total) by mouth daily.   Probiotic Product (PROBIOTIC DAILY) CAPS Take 1 capsule by mouth daily. Reported on 3/73/4287   REPATHA SURECLICK 681 MG/ML SOAJ INJECT CONTENTS OF 1 PEN (140MG ) INTO SKIN EVERY 14 DAYS.   [DISCONTINUED] amLODipine (NORVASC) 2.5 MG tablet Take 1 tablet (2.5 mg total) by mouth in the morning and at bedtime.   [DISCONTINUED] chlorthalidone (HYGROTON) 25 MG tablet Take 0.5 tablets (12.5 mg total) by mouth daily.     Allergies:   Adhesive [tape] and Rosuvastatin  Past Medical History:  Diagnosis Date   Abnormal thyroid function test 07/01/2013   Anxiety    Arthritis    Cancer ([place])    Coronary artery disease    Dermatitis 11/25/2013   Glaucoma 11/02/2012   HTN (hypertension) 11/02/2012   hx: breast cancer, IDC (Microinvasive) w DCIS, 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 - 07/10/2011   Hyperlipidemia    Hypothyroid    Obesity     Past Surgical History:  Procedure Laterality Date   AUGMENTATION MAMMAPLASTY Left    05/2006   BREAST SURGERY  10/2005   Left Mastectomy, reconstruction, reduction on right. Dr. Harlow Mares   CARDIAC CATHETERIZATION     COLONOSCOPY     CORONARY STENT INTERVENTION N/A 06/01/2019   Procedure: CORONARY STENT INTERVENTION;  Surgeon: Sherren Mocha, MD;  Location: [facility];  Service: Cardiovascular;  Laterality: N/A;   CRYOTHERAPY     GYN for CIN III   DILATION AND  CURETTAGE OF UTERUS     EXCISION MORTON'S NEUROMA     right foot   EYE SURGERY Right 05/08/2018   LEFT HEART CATH AND CORONARY ANGIOGRAPHY N/A 06/01/2019   Procedure: LEFT HEART CATH AND CORONARY ANGIOGRAPHY;  Surgeon: Sherren Mocha, MD;  Location: [facility];  Service: Cardiovascular;  Laterality: N/A;   MASTECTOMY     REDUCTION MAMMAPLASTY Right      Social History:  The patient  reports that she has never smoked. She has never used smokeless tobacco. She reports current alcohol use. She reports that she does not use drugs.   Family History:  The patient's family history includes CAD in her mother; Colon cancer in her paternal grandmother; Hyperlipidemia in her mother and sister; Other in her father; Parkinsonism in her mother; Pneumonia in her mother.    ROS:  Please see the history of present illness. All other systems are reviewed and  Negative to the above problem except as noted.    PHYSICAL EXAM: VS:  BP 130/64   Pulse (!) 51   Ht 5\' 3"  (1.6 m)   Wt 152 lb (68.9 kg)   SpO2 97%   BMI 26.93 kg/m   GEN: Pt is a 80 yo female in no acute distress  HEENT: normal  Neck: no JVD  No bruits Cardiac: RRR; no murmurs.  No LE edema  Respiratory:  CTA  GI: soft, nontender, nondistended, + BS   Skin: warm and dry, no rash Neuro:  Strength and sensation are intact Psych: euthymic mood, full affect   EKG:  EKG is not ordered today.   Lipid Panel    Component Value Date/Time   CHOL 174 04/20/2021 1159   CHOL 131 10/27/2020 0936   TRIG 101.0 04/20/2021 1159   HDL 70.90 04/20/2021 1159   HDL 64 10/27/2020 0936   CHOLHDL 2 04/20/2021 1159   VLDL 20.2 04/20/2021 1159   LDLCALC 83 04/20/2021 1159   LDLCALC 53 10/27/2020 0936   LDLCALC 30 05/12/2020 1136   LDLDIRECT 54.0 06/01/2021 0829      Wt Readings from Last 3 Encounters:  07/21/21 152 lb (68.9 kg)  06/29/21 151 lb 12.8 oz (68.9 kg)  06/01/21 151 lb 9.6 oz (68.8 kg)    Cardiac Studies  CORONARY STENT  INTERVENTION 05/2019  LEFT HEART CATH AND CORONARY ANGIOGRAPHY  Conclusion   1) Severe single vessel CAD with critical stenosis of the mid-circumflex, treated successfully with PTCA and stenting (2.5x12 mm Synergy DES 2) nonobstructive LAD stenosis 3) widely patent  RCA, dominant vessel 4) normal LV systolic function   Recommend: DAPT with ASA and ticagrelor x 12 months without interruption. Pt loaded with ticagrelor 180 mg on the cath lab table. DC home tomorrow am if no complications arise.      ASSESSMENT AND PLAN:  1  CAD  Pt denies CP   No symptoms of angina  Follow   2  HTN  BP is a little low at times    Cut back on amlodipine to 2.5 mg at night   Does note some edema   Will go back to 25 mg chlorthalidone   Keep on K   Follow up BMET in 10 day     3  HL   In  Aug 2022, LDL 83    HDL 71   4.  PAD atherosclerosis of aorta  Continue statin    F/U with labs   Keep track of BP   Follow up in Feb   Current medicines are reviewed at length with the patient today.  The patient does not have concerns regarding medicines.  Signed, Dorris Carnes, MD  07/21/2021 11:26 PM    [facility], [address], [place]  [address] Phone: [phone]; Fax: [phone]

## 2021-07-21 ENCOUNTER — Ambulatory Visit: Payer: Medicare PPO | Admitting: Internal Medicine

## 2021-07-21 ENCOUNTER — Other Ambulatory Visit: Payer: Self-pay

## 2021-07-21 ENCOUNTER — Encounter: Payer: Self-pay | Admitting: Internal Medicine

## 2021-07-21 VITALS — BP 130/64 | HR 51 | Ht 63.0 in | Wt 152.0 lb

## 2021-07-21 DIAGNOSIS — I2510 Atherosclerotic heart disease of native coronary artery without angina pectoris: Secondary | ICD-10-CM

## 2021-07-21 MED ORDER — AMLODIPINE BESYLATE 2.5 MG PO TABS: 2.5000 mg | ORAL_TABLET | Freq: Every day | ORAL | 3 refills | Status: DC

## 2021-07-21 MED ORDER — CHLORTHALIDONE 25 MG PO TABS: 25.0000 mg | ORAL_TABLET | Freq: Every day | ORAL | 1 refills | Status: DC

## 2021-07-21 NOTE — Patient Instructions (Addendum)
Medication Instructions:    START TAKING AMLODIPINE  2.5 MG IN THE EVENING ONLY   START TAKING CHLORTHALIDONE 25 MG ONCE A DAY    *If you need a refill on your cardiac medications before your next appointment, please call your pharmacy*   Lab Work:  [facility] ON 07-25-21   If you have labs (blood work) drawn today and your tests are completely normal, you will receive your results only by: [place] (if you have MyChart) OR A paper copy in the mail If you have any lab test that is abnormal or we need to change your treatment, we will call you to review the results.    Testing/Procedures: NONE ORDERED  TODAY    Follow-Up: At [facility], you and your health needs are our priority.  As part of our continuing mission to provide you with exceptional heart care, we have created designated Provider Care Teams.  These Care Teams include your primary Cardiologist (physician) and Advanced Practice Providers (APPs -  Physician Assistants and Nurse Practitioners) who all work together to provide you with the care you need, when you need it.  We recommend signing up for the patient portal called "MyChart".  Sign up information is provided on this After Visit Summary.  MyChart is used to connect with patients for Virtual Visits (Telemedicine).  Patients are able to view lab/test results, encounter notes, upcoming appointments, etc.  Non-urgent messages can be sent to your provider as well.   To learn more about what you can do with MyChart, go to [email or web address].    Your next appointment:   3 month(s)  The format for your next appointment:   In Person  Provider:   Dorris Carnes, MD {   Other Instructions

## 2021-07-25 ENCOUNTER — Other Ambulatory Visit: Payer: Medicare PPO

## 2021-08-01 ENCOUNTER — Other Ambulatory Visit: Payer: Self-pay

## 2021-08-01 ENCOUNTER — Other Ambulatory Visit: Payer: Medicare PPO | Admitting: *Deleted

## 2021-08-01 DIAGNOSIS — I2510 Atherosclerotic heart disease of native coronary artery without angina pectoris: Secondary | ICD-10-CM | POA: Diagnosis not present

## 2021-08-01 LAB — BASIC METABOLIC PANEL
BUN/Creatinine Ratio: 21 (ref 12–28)
BUN: 19 mg/dL (ref 8–27)
CO2: 29 mmol/L (ref 20–29)
Calcium: 9.5 mg/dL (ref 8.7–10.3)
Chloride: 104 mmol/L (ref 96–106)
Creatinine, Ser: 0.92 mg/dL (ref 0.57–1.00)
Glucose: 91 mg/dL (ref 70–99)
Potassium: 3.4 mmol/L — ABNORMAL LOW (ref 3.5–5.2)
Sodium: 144 mmol/L (ref 134–144)
eGFR: 63 mL/min/{1.73_m2} (ref >59)

## 2021-08-02 ENCOUNTER — Telehealth: Payer: Self-pay

## 2021-08-02 MED ORDER — POTASSIUM CHLORIDE CRYS ER 20 MEQ PO TBCR: 20.0000 meq | EXTENDED_RELEASE_TABLET | Freq: Every day | ORAL | 3 refills | Status: DC

## 2021-08-02 NOTE — Telephone Encounter (Signed)
Called pt reviewed results and MD recommendations.  Pt is agreeable to plan and verbalizes understanding. Order placed. Pt reports that she received an alert pertaining to her heart that really scared her.  She checked mychart and could not find the alert.  I do not see in our system that any type of testing for her heart was performed recently.  I advised pt that any critical result pertaining to heart would come from our office.  Pt thanked me for patience and thanks Dr. Harrington Challenger for all of her great care.

## 2021-08-02 NOTE — Telephone Encounter (Signed)
-----   Message from [place], MD sent at 08/01/2021 11:53 PM EST ----- POtassium remains a little low    I would switch to 20 MEQ potassium per day

## 2021-08-15 ENCOUNTER — Telehealth: Payer: Self-pay

## 2021-08-15 NOTE — Telephone Encounter (Signed)
PA Case: [id number] Status: Approved Coverage Starts on: 09/10/2020 12:00:00 AM Coverage Ends on: 09/09/2022 12:00:00 AM

## 2021-08-22 ENCOUNTER — Other Ambulatory Visit: Payer: Self-pay | Admitting: Family Medicine

## 2021-08-23 NOTE — Progress Notes (Signed)
Phone [phone] In person visit   Subjective:   [name] is a 80 y.o. year old very pleasant female patient who presents for/with See problem oriented charting Chief Complaint  Patient presents with   Follow-up    This visit occurred during the SARS-CoV-2 public health emergency.  Safety protocols were in place, including screening questions prior to the visit, additional usage of staff PPE, and extensive cleaning of exam room while observing appropriate contact time as indicated for disinfecting solutions.   Past Medical History-  Patient Active Problem List   Diagnosis Date Noted   CAD (coronary artery disease) s/p stent mCx 05/2019 06/02/2019    Priority: High   Grieving 02/18/2017    Priority: High   Osteoporosis 11/05/2016    Priority: High   Aortic atherosclerosis ([place]) 09/15/2020    Priority: Medium    Chest pain 12/29/2018    Priority: Medium    Primary open-angle glaucoma 11/20/2017    Priority: Medium    Hypothyroidism 07/01/2013    Priority: Medium    Hypertension 11/02/2012    Priority: Medium    Hyperlipidemia 07/24/2011    Priority: Medium    hx: breast cancer, IDC (Microinvasive) w DCIS, 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 - 07/10/2011    Priority: Medium    History of adenomatous polyp of colon 09/30/2018    Priority: Low   Medicare annual wellness visit, subsequent 09/26/2014    Priority: Low   Vitamin D deficiency 11/02/2012    Priority: Low   Obesity (BMI 30-39.9) 07/24/2011    Priority: Low   PAD (peripheral artery disease) ([place]) 09/15/2020   Statin myopathy 07/01/2019   Hypokalemia 06/02/2019   NSTEMI (non-ST elevated myocardial infarction) ([place]) 05/31/2019   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 180 tablet 3   aspirin EC 81 MG tablet Take 81 mg by mouth daily.     Bimatoprost (LUMIGAN OP) Place 1 drop into both eyes at bedtime.      chlorthalidone (HYGROTON) 25 MG tablet Take 1 tablet (25 mg total) by mouth daily. 90 tablet 1   Cholecalciferol (VITAMIN D-3) 5000 UNITS TABS Take 5,000 mg by mouth daily.     dorzolamide-timolol (COSOPT) 22.3-6.8 MG/ML ophthalmic solution Place 1 drop into both eyes 2 (two) times daily.      famotidine (PEPCID) 20 MG tablet TAKE 1 TABLET BY MOUTH TWICE DAILY. 180 tablet 3   icosapent Ethyl (VASCEPA) 1 g capsule Take 1 capsule (1 g total) by mouth 2 (two) times daily. 60 capsule 11   levothyroxine (SYNTHROID) 50 MCG tablet TAKE ONE TABLET BEFORE BREAKFAST. 90 tablet 0   metoprolol tartrate (LOPRESSOR) 25 MG tablet TAKE ONE TABLET BY MOUTH TWICE DAILY 180 tablet 0   NEXLETOL 180 MG TABS TAKE ONE TABLET BY MOUTH DAILY 90 tablet 0   nitroGLYCERIN (NITROSTAT) 0.4 MG SL tablet Place 1 tablet (0.4 mg total) under the tongue every 5 (five) minutes x 3 doses as needed for chest pain. 25 tablet 3   pilocarpine (PILOCAR) 1 % ophthalmic solution Place 1 drop into both  eyes 2 (two) times daily.      Polyethyl Glycol-Propyl Glycol (SYSTANE OP) Place 1-2 drops into both eyes as needed (for dry eyes).      potassium chloride (KLOR-CON) 10 MEQ tablet Take 2 tablets (20 mEq total) by mouth daily. 180 tablet 3   Probiotic Product (PROBIOTIC DAILY) CAPS Take 1 capsule by mouth daily. Reported on 10/10/2015     REPATHA SURECLICK 176 MG/ML SOAJ INJECT CONTENTS OF 1 PEN (140MG ) INTO SKIN EVERY 14 DAYS. 2 mL 11   No current facility-administered medications for this visit.     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 Harrington Challenger on brilinta and aspirin #PAD listed by Dr. Mable Fill does have aortic atherosclerosis #hyperlipidemia S: Medication: Repatha 140 mg every 2 weeks and Vascepa 1 g twice daily-also on clear study (stopping mid 2022) and we restarted Nexletol after the study and lipids improved -Remains on aspirin 81 mg  No chest pain or shortness of breath reported Lab Results  Component Value Date   CHOL 174 04/20/2021   HDL 70.90 04/20/2021   LDLCALC 83 04/20/2021   LDLDIRECT 54.0 06/01/2021   TRIG 101.0 04/20/2021   CHOLHDL 2 04/20/2021   A/P: CAD and PAD asymptomatic-continue current medication For hyperlipidemia LDL has been at goal-continue current medication  #hypertension S: medication: Metoprolol 25 mg twice daily, amlodipine 5 mg at bedtime previously was reduced to 2.5 mg by cardiology, chlorthalidone 25 mg with potassium every other day previously not taking every day per cardiology -did increase potassium to 20 meq due to prior low on 08/01/21  Patient continued to work with nutritionist every few weeks and is a member at [employer] well for exercise weight is down 2 pounds from last visit Home readings #s: average around 160V and diastolic well under 80- as low as 106 but over 140 at times- would prefer not to increase rx BP Readings from Last 3 Encounters:  08/30/21 130/62  07/21/21 130/64  06/29/21 124/74  A/P: good control even with recent changes- continue current meds  #hypothyroidism S: compliant On thyroid medication-levothyroxine 50 mcg daily before breakfast Lab Results  Component Value Date   TSH 1.85  04/20/2021   A/P:Has been well controlled-continue current medication  #Vitamin D deficiency S: Medication: 5000 units daily  Last vitamin D Lab Results  Component Value Date   VD25OH 46.85 04/20/2021  A/P: Well-controlled on last check-continue current medication  #Osteoporosis- patient had not tolerated Prolia due to achiness and had a lot of dental issues so not thought ideal candidate for bisphosphonates.   # OA- wants to start tylenol arthritis and tha tis reasonable   Recommended follow up: Return in about 3 months (around 11/28/2021) for follow-up or sooner if needed. Future Appointments  Date Time Provider [place]  11/08/2021 11:40 AM Fay Records, MD [facility]  12/14/2021  9:40 AM Yong Channel Brayton Mars, MD [facility]    Lab/Order associations:  ICD-10-CM   1. Essential hypertension  I10     2. Mixed hyperlipidemia  E78.2     3. PAD (peripheral artery disease) (HCC)  I73.9     4. Coronary artery disease involving native coronary artery of native heart without angina pectoris  I25.10     5. Hypothyroidism, unspecified type  E03.9      Meds ordered this encounter  Medications   potassium chloride (KLOR-CON) 10 MEQ tablet    Sig: Take 2 tablets (20 mEq total) by mouth daily.    Dispense:  180 tablet    Refill:  3    Patient does better swallowing these than the 20 meq option   I,Jada Bradford,acting as a scribe for Garret Reddish, MD.,have documented all relevant documentation on the behalf of Garret Reddish, MD,as directed by  Garret Reddish, MD while in the presence of Garret Reddish, MD.  I, Garret Reddish, MD, have reviewed all documentation for this visit. The documentation on 08/30/21 for the exam, diagnosis, procedures, and orders are all accurate and complete.   Return precautions advised.  Garret Reddish, MD

## 2021-08-30 ENCOUNTER — Ambulatory Visit: Payer: Medicare PPO | Admitting: Family Medicine

## 2021-08-30 ENCOUNTER — Other Ambulatory Visit: Payer: Self-pay

## 2021-08-30 ENCOUNTER — Encounter: Payer: Self-pay | Admitting: Family Medicine

## 2021-08-30 VITALS — BP 130/62 | HR 72 | Temp 98.7°F | Ht 63.0 in | Wt 149.8 lb

## 2021-08-30 DIAGNOSIS — I2510 Atherosclerotic heart disease of native coronary artery without angina pectoris: Secondary | ICD-10-CM | POA: Diagnosis not present

## 2021-08-30 DIAGNOSIS — I10 Essential (primary) hypertension: Secondary | ICD-10-CM

## 2021-08-30 DIAGNOSIS — E039 Hypothyroidism, unspecified: Secondary | ICD-10-CM

## 2021-08-30 DIAGNOSIS — E782 Mixed hyperlipidemia: Secondary | ICD-10-CM

## 2021-08-30 DIAGNOSIS — I739 Peripheral vascular disease, unspecified: Secondary | ICD-10-CM

## 2021-08-30 MED ORDER — POTASSIUM CHLORIDE ER 10 MEQ PO TBCR: 20.0000 meq | EXTENDED_RELEASE_TABLET | Freq: Every day | ORAL | 3 refills | Status: DC

## 2021-08-30 NOTE — Patient Instructions (Addendum)
Hold off on labs today- glad you are doing so well!   Recommended follow up: Return in about 3 months (around 11/28/2021) for follow-up or sooner if needed. And we will do lab work that visit.  Happy Holidays!

## 2021-09-13 ENCOUNTER — Other Ambulatory Visit: Payer: Self-pay | Admitting: Internal Medicine

## 2021-09-14 MED ORDER — ICOSAPENT ETHYL 1 G PO CAPS: 1.0000 g | ORAL_CAPSULE | Freq: Two times a day (BID) | ORAL | 3 refills | Status: DC

## 2021-10-10 ENCOUNTER — Other Ambulatory Visit: Payer: Self-pay | Admitting: Family Medicine

## 2021-10-19 ENCOUNTER — Other Ambulatory Visit: Payer: Self-pay | Admitting: Internal Medicine

## 2021-11-02 DIAGNOSIS — H401413 Capsular glaucoma with pseudoexfoliation of lens, right eye, severe stage: Secondary | ICD-10-CM | POA: Diagnosis not present

## 2021-11-02 DIAGNOSIS — H401422 Capsular glaucoma with pseudoexfoliation of lens, left eye, moderate stage: Secondary | ICD-10-CM | POA: Diagnosis not present

## 2021-11-02 DIAGNOSIS — Z961 Presence of intraocular lens: Secondary | ICD-10-CM | POA: Diagnosis not present

## 2021-11-06 NOTE — Progress Notes (Signed)
? ?Cardiology Office Note ? ? ?Date:  11/08/2021  ? ?ID:  [name], DOB 11-18-1940, MRN [id number] ? ?PCP:  Marin Olp, MD  ?Cardiologist:   Dorris Carnes, MD  ? ? ?Patient presents for f/u of CAD  ?  ?History of Present Illness: ?[name] is a 81 y.o. female with a history of HTN, HL and  CAD  She was admitted in April 2020 with CP   CT chest/abd showed extensive atherosclerosis.  Cardac CT angiogram showed no obstructive lesons  In  September2020 she was admtted with an NSTEMI.  Cardiac catheterization showed severe single-vessel disease with a critical stenosis of the left circumflex.  She underwent PTCA/DES to this area.  There was nonobstructive disease noted in the LAD.  Plan for dual antiplatelet therapy for 12 mont ?The pt went to ED on 12/2020 with chest pressure   Felt noncardiac  Spring 2022:   [name] after that was normal   ? ?I saw the pt back on 10.20.22.  She complained of feeling washed out, SOB  She was very upset, grieving over death of husband  She wa doing better in Nov   BP 100 to 130s    ? ?I saw the pt in cinic in Nov 2022  Overall she is doing good   Going back to church    ?Denies CP  Breaghing is OK     ?A few weekends ago she had a small amount of margarita.   BP went up   She got flushed     ?Improved after   ?Current Meds  ?Medication Sig  ? amLODipine (NORVASC) 2.5 MG tablet Take 1 tablet (2.5 mg total) by mouth at bedtime.  ? aspirin EC 81 MG tablet Take 81 mg by mouth daily.  ? Bimatoprost (LUMIGAN OP) Place 1 drop into both eyes at bedtime.   ? chlorthalidone (HYGROTON) 25 MG tablet Take 1 tablet (25 mg total) by mouth daily.  ? Cholecalciferol (VITAMIN D-3) 5000 UNITS TABS Take 5,000 mg by mouth daily.  ? dorzolamide-timolol (COSOPT) 22.3-6.8 MG/ML ophthalmic solution Place 1 drop into both eyes 2 (two) times daily.   ? famotidine (PEPCID) 20 MG tablet TAKE 1 TABLET BY MOUTH TWICE DAILY.  ? icosapent Ethyl (VASCEPA) 1 g capsule Take 1 capsule (1 g total) by  mouth 2 (two) times daily.  ? levothyroxine (SYNTHROID) 50 MCG tablet TAKE ONE TABLET BEFORE BREAKFAST.  ? metoprolol tartrate (LOPRESSOR) 25 MG tablet TAKE ONE TABLET BY MOUTH TWICE DAILY  ? NEXLETOL 180 MG TABS TAKE ONE TABLET BY MOUTH DAILY  ? nitroGLYCERIN (NITROSTAT) 0.4 MG SL tablet Place 1 tablet (0.4 mg total) under the tongue every 5 (five) minutes x 3 doses as needed for chest pain.  ? pilocarpine (PILOCAR) 1 % ophthalmic solution Place 1 drop into both eyes 2 (two) times daily.   ? Polyethyl Glycol-Propyl Glycol (SYSTANE OP) Place 1-2 drops into both eyes as needed (for dry eyes).   ? potassium chloride (KLOR-CON) 10 MEQ tablet Take 2 tablets (20 mEq total) by mouth daily.  ? Probiotic Product (PROBIOTIC DAILY) CAPS Take 1 capsule by mouth daily. Reported on 10/10/2015  ? REPATHA SURECLICK 546 MG/ML SOAJ INJECT CONTENTS OF 1 PEN (140MG ) INTO SKIN EVERY 14 DAYS.  ? ? ? ?Allergies:   Adhesive [tape] and Rosuvastatin  ? ?Past Medical History:  ?Diagnosis Date  ? Abnormal thyroid function test 07/01/2013  ? Anxiety   ? Arthritis   ?  Cancer [facility])   ? Coronary artery disease   ? Dermatitis 11/25/2013  ? Glaucoma 11/02/2012  ? HTN (hypertension) 11/02/2012  ? hx: breast cancer, IDC (Microinvasive) w DCIS, 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 - 07/10/2011  ? Hyperlipidemia   ? Hypothyroid   ? Obesity   ? ? ?Past Surgical History:  ?Procedure Laterality Date  ? AUGMENTATION MAMMAPLASTY Left   ? 05/2006  ? BREAST SURGERY  10/2005  ? Left Mastectomy, reconstruction, reduction on right. Dr. Harlow Mares  ? CARDIAC CATHETERIZATION    ? COLONOSCOPY    ? CORONARY STENT INTERVENTION N/A 06/01/2019  ? Procedure: CORONARY STENT INTERVENTION;  Surgeon: Sherren Mocha, MD;  Location: [place] [facility];  Service: Cardiovascular;  Laterality: N/A;  ? CRYOTHERAPY    ? GYN for CIN III  ? DILATION AND CURETTAGE OF UTERUS    ? EXCISION MORTON'S NEUROMA    ? right foot  ? EYE SURGERY Right 05/08/2018  ? LEFT HEART CATH AND CORONARY ANGIOGRAPHY N/A 06/01/2019  ?  Procedure: LEFT HEART CATH AND CORONARY ANGIOGRAPHY;  Surgeon: Sherren Mocha, MD;  Location: [place] [facility];  Service: Cardiovascular;  Laterality: N/A;  ? MASTECTOMY    ? REDUCTION MAMMAPLASTY Right   ? ? ? ?Social History:  The patient  reports that she has never smoked. She has never used smokeless tobacco. She reports current alcohol use. She reports that she does not use drugs.  ? ?Family History:  The patient's family history includes CAD in her mother; Colon cancer in her paternal grandmother; Hyperlipidemia in her mother and sister; Other in her father; Parkinsonism in her mother; Pneumonia in her mother.  ? ? ?ROS:  Please see the history of present illness. All other systems are reviewed and  Negative to the above problem except as noted.  ? ? ?PHYSICAL EXAM: ?VS:  BP 136/74   Pulse 64   Ht 5\' 3"  (1.6 m)   Wt 150 lb (68 kg)   SpO2 91%   BMI 26.57 kg/m?   ?GEN: Pt is a 81 yo female in no acute distress  ?HEENT: normal  ?Neck: no JVD  No bruits ?Cardiac: RRR; no murmurs.  No LE edema  ?Respiratory:  CTA  ?GI: soft, nontender, nondistended, + BS   ?Skin: warm and dry, no rash ?Neuro:  Strength and sensation are intact ?Psych: euthymic mood, full affect ? ? ?EKG:  EKG is not ordered today.  SR 64 bpm    ? ? ?Lipid Panel ?   ?Component Value Date/Time  ? CHOL 174 04/20/2021 1159  ? CHOL 131 10/27/2020 0936  ? TRIG 101.0 04/20/2021 1159  ? HDL 70.90 04/20/2021 1159  ? HDL 64 10/27/2020 0936  ? CHOLHDL 2 04/20/2021 1159  ? VLDL 20.2 04/20/2021 1159  ? [place] 83 04/20/2021 1159  ? LDLCALC 53 10/27/2020 0936  ? LDLCALC 30 05/12/2020 1136  ? LDLDIRECT 54.0 06/01/2021 0829  ? ?  ? ?Wt Readings from Last 3 Encounters:  ?11/08/21 150 lb (68 kg)  ?11/07/21 149 lb 12.8 oz (67.9 kg)  ?08/30/21 149 lb 12.8 oz (67.9 kg)  ?  ?Cardiac Studies ? ?CORONARY STENT INTERVENTION 05/2019  ?LEFT HEART CATH AND CORONARY ANGIOGRAPHY  ?Conclusion ?  ?1) Severe single vessel CAD with critical stenosis of the mid-circumflex,  treated successfully with PTCA and stenting (2.5x12 mm Synergy DES ?2) nonobstructive LAD stenosis ?3) widely patent RCA, dominant vessel ?4) normal LV systolic function ?  ?Recommend: DAPT with ASA and ticagrelor x  12 months without interruption. Pt loaded with ticagrelor 180 mg on the cath lab table. DC home tomorrow am if no complications arise.   ? ? ? ?ASSESSMENT AND PLAN: ? ?1  CAD  Pt remains asymptomatic   ? ?2  HTN  BP is better at home   110s to 120s   Not too low     ? ?3  HL  Will check lipomed, Lpa Apo B on current regimen   ? ?4.  PAD atherosclerosis of aorta  Cnotinue meds    ? ?Check CBC   Check BMET    ?F/U next winter  ? ?Current medicines are reviewed at length with the patient today.  The patient does not have concerns regarding medicines. ? ?Signed, ?Dorris Carnes, MD  ?11/08/2021 12:06 PM    ?[place] ?[address], [place], [place]  [address] ?Phone: [phone]; Fax: [phone]  ? ? ?

## 2021-11-07 ENCOUNTER — Encounter: Payer: Self-pay | Admitting: Physician Assistant

## 2021-11-07 ENCOUNTER — Ambulatory Visit: Payer: Medicare PPO | Admitting: Physician Assistant

## 2021-11-07 ENCOUNTER — Other Ambulatory Visit: Payer: Self-pay

## 2021-11-07 VITALS — BP 140/80 | Temp 97.9°F | Wt 149.8 lb

## 2021-11-07 DIAGNOSIS — I10 Essential (primary) hypertension: Secondary | ICD-10-CM | POA: Diagnosis not present

## 2021-11-07 DIAGNOSIS — R350 Frequency of micturition: Secondary | ICD-10-CM | POA: Diagnosis not present

## 2021-11-07 LAB — POCT URINALYSIS DIPSTICK
Bilirubin, UA: NEGATIVE
Blood, UA: NEGATIVE
Glucose, UA: NEGATIVE
Ketones, UA: NEGATIVE
Leukocytes, UA: NEGATIVE
Nitrite, UA: NEGATIVE
Protein, UA: NEGATIVE
Spec Grav, UA: 1.01 (ref 1.010–1.025)
Urobilinogen, UA: 0.2 E.U./dL
pH, UA: 6 (ref 5.0–8.0)

## 2021-11-07 NOTE — Patient Instructions (Signed)
Good to meet you today! Good news is your urine is clear - no signs of infection. I think your blood pressure readings look great.  Continue to aim towards 64 oz water daily. Limit salt.  Recheck if any new concerns.

## 2021-11-07 NOTE — Progress Notes (Signed)
Subjective:    Patient ID: [name], female    DOB: 02-Apr-1941, 81 y.o.   MRN: [id number]  Chief Complaint  Patient presents with   Cystitis    Drank alcohol over the week, just a couple sips of a margarita and caused increased urination that's worsening over the past few days    Hypertension    Elevated BP and slightly blurred vision  139/66 last night  152/75 this morning   HPI:  Patient is in today for concerns about possible urine infection. Symptoms started on 11/04/21. She had a few sips of a margarita and felt like her blood pressure was elevated after that and she had to urinate more often.   Not a coffee drinker. Has some tea and diet coke. She had more salt in her diet over the weekend as well when she went out to a restaurant.   Vitals yesterday afternoon: 131/63 and pulse of 70 bpm.  She has regular f/up with cardiologist, Dr. Harrington Challenger, scheduled tomorrow.   No abd pain, dysuria, blood in urine, fever, chills or other symptoms. Says she has been feeling anxious about her health esp since husband's passing 4 years ago.     Past Medical History:  Diagnosis Date   Abnormal thyroid function test 07/01/2013   Anxiety    Arthritis    Cancer ([place])    Coronary artery disease    Dermatitis 11/25/2013   Glaucoma 11/02/2012   HTN (hypertension) 11/02/2012   hx: breast cancer, IDC (Microinvasive) w DCIS, 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 - 07/10/2011   Hyperlipidemia    Hypothyroid    Obesity     Past Surgical History:  Procedure Laterality Date   AUGMENTATION MAMMAPLASTY Left    05/2006   BREAST SURGERY  10/2005   Left Mastectomy, reconstruction, reduction on right. Dr. Harlow Mares   CARDIAC CATHETERIZATION     COLONOSCOPY     CORONARY STENT INTERVENTION N/A 06/01/2019   Procedure: CORONARY STENT INTERVENTION;  Surgeon: Sherren Mocha, MD;  Location: [place] [facility];  Service: Cardiovascular;  Laterality: N/A;   CRYOTHERAPY     GYN for CIN III   DILATION AND CURETTAGE OF  UTERUS     EXCISION MORTON'S NEUROMA     right foot   EYE SURGERY Right 05/08/2018   LEFT HEART CATH AND CORONARY ANGIOGRAPHY N/A 06/01/2019   Procedure: LEFT HEART CATH AND CORONARY ANGIOGRAPHY;  Surgeon: Sherren Mocha, MD;  Location: [place] [facility];  Service: Cardiovascular;  Laterality: N/A;   MASTECTOMY     REDUCTION MAMMAPLASTY Right     Family History  Problem Relation Age of Onset   Pneumonia Mother    Hyperlipidemia Mother    Parkinsonism Mother    CAD Mother    Other Father        bilateral subdural hematoma   Colon cancer Paternal Grandmother    Hyperlipidemia Sister    Prostate cancer Neg Hx    Breast cancer Neg Hx    Heart disease Neg Hx    Diabetes Neg Hx     Social History   Tobacco Use   Smoking status: Never   Smokeless tobacco: Never  Vaping Use   Vaping Use: Never used  Substance Use Topics   Alcohol use: Yes    Comment: Rare   Drug use: No     Allergies  Allergen Reactions   Adhesive [Tape] Other (See Comments)    Causes skin to turn red where touched  Rosuvastatin Other (See Comments)    Joint & muscle aches, sleep issues.    Review of Systems NEGATIVE UNLESS OTHERWISE INDICATED IN HPI      Objective:     BP 140/80    Temp 97.9 F (36.6 C) (Temporal)    Wt 149 lb 12.8 oz (67.9 kg)    BMI 26.54 kg/m   Wt Readings from Last 3 Encounters:  11/07/21 149 lb 12.8 oz (67.9 kg)  08/30/21 149 lb 12.8 oz (67.9 kg)  07/21/21 152 lb (68.9 kg)    BP Readings from Last 3 Encounters:  11/07/21 140/80  08/30/21 130/62  07/21/21 130/64     Physical Exam Vitals and nursing note reviewed.  Constitutional:      General: She is not in acute distress.    Appearance: Normal appearance. She is not ill-appearing.  HENT:     Head: Normocephalic and atraumatic.  Cardiovascular:     Rate and Rhythm: Normal rate and regular rhythm.     Pulses: Normal pulses.     Heart sounds: Normal heart sounds.  Pulmonary:     Effort: Pulmonary effort  is normal.     Breath sounds: Normal breath sounds.  Abdominal:     General: Abdomen is flat. Bowel sounds are normal.     Palpations: Abdomen is soft.     Tenderness: There is no right CVA tenderness or left CVA tenderness.  Skin:    General: Skin is warm and dry.  Neurological:     General: No focal deficit present.     Mental Status: She is alert.  Psychiatric:        Mood and Affect: Mood normal.       Assessment & Plan:   Problem List Items Addressed This Visit   None Visit Diagnoses     Urinary frequency    -  Primary   Relevant Orders   POCT Urinalysis Dipstick (Completed)   Essential hypertension           Plan: -Reassured pt that urine is clear and BP is at goal. She will continue to drink water and limit salt. She will monitor BP at home and f/up with Dr. Harrington Challenger tomorrow.  F/up with me prn.   Quianna Avery M Danile Trier, PA-C

## 2021-11-08 ENCOUNTER — Other Ambulatory Visit: Payer: Self-pay

## 2021-11-08 ENCOUNTER — Ambulatory Visit: Payer: Medicare PPO | Admitting: Internal Medicine

## 2021-11-08 ENCOUNTER — Encounter: Payer: Self-pay | Admitting: Internal Medicine

## 2021-11-08 VITALS — BP 136/74 | HR 64 | Ht 63.0 in | Wt 150.0 lb

## 2021-11-08 DIAGNOSIS — E782 Mixed hyperlipidemia: Secondary | ICD-10-CM

## 2021-11-08 DIAGNOSIS — Z79899 Other long term (current) drug therapy: Secondary | ICD-10-CM | POA: Diagnosis not present

## 2021-11-08 DIAGNOSIS — E669 Obesity, unspecified: Secondary | ICD-10-CM

## 2021-11-08 DIAGNOSIS — I2510 Atherosclerotic heart disease of native coronary artery without angina pectoris: Secondary | ICD-10-CM

## 2021-11-08 NOTE — Patient Instructions (Signed)
Medication Instructions:  ?Your physician recommends that you continue on your current medications as directed. Please refer to the Current Medication list given to you today. ? ?*If you need a refill on your cardiac medications before your next appointment, please call your pharmacy* ? ? ?Lab Work: ?NMR, BMET, CBC ?If you have labs (blood work) drawn today and your tests are completely normal, you will receive your results only by: ?MyChart Message (if you have MyChart) OR ?A paper copy in the mail ?If you have any lab test that is abnormal or we need to change your treatment, we will call you to review the results. ? ? ?Testing/Procedures: ?none ? ? ?Follow-Up: ?At [facility], you and your health needs are our priority.  As part of our continuing mission to provide you with exceptional heart care, we have created designated Provider Care Teams.  These Care Teams include your primary Cardiologist (physician) and Advanced Practice Providers (APPs -  Physician Assistants and Nurse Practitioners) who all work together to provide you with the care you need, when you need it. ? ?We recommend signing up for the patient portal called "MyChart".  Sign up information is provided on this After Visit Summary.  MyChart is used to connect with patients for Virtual Visits (Telemedicine).  Patients are able to view lab/test results, encounter notes, upcoming appointments, etc.  Non-urgent messages can be sent to your provider as well.   ?To learn more about what you can do with MyChart, go to [email or web address].   ? ?Your next appointment:   ?9 month(s) ? ?The format for your next appointment:   ?In Person ? ?Provider:   ?Dorris Carnes, MD   ? ? ?Other Instructions ?  ?

## 2021-11-15 LAB — BASIC METABOLIC PANEL
BUN/Creatinine Ratio: 25 (ref 12–28)
BUN: 25 mg/dL (ref 8–27)
CO2: 28 mmol/L (ref 20–29)
Calcium: 10 mg/dL (ref 8.7–10.3)
Chloride: 104 mmol/L (ref 96–106)
Creatinine, Ser: 1.01 mg/dL — ABNORMAL HIGH (ref 0.57–1.00)
Glucose: 85 mg/dL (ref 70–99)
Potassium: 3.9 mmol/L (ref 3.5–5.2)
Sodium: 143 mmol/L (ref 134–144)
eGFR: 56 mL/min/{1.73_m2} — ABNORMAL LOW (ref >59)

## 2021-11-15 LAB — NMR, LIPOPROFILE
Cholesterol, Total: 145 mg/dL (ref 100–199)
HDL Particle Number: 40.9 umol/L (ref >=30.5)
HDL-C: 65 mg/dL (ref >39)
LDL Particle Number: 979 nmol/L (ref <1000)
LDL Size: 20.6 nm (ref >20.5)
LDL-C (NIH Calc): 64 mg/dL (ref 0–99)
LP-IR Score: 51 — ABNORMAL HIGH (ref <=45)
Small LDL Particle Number: 458 nmol/L (ref <=527)
Triglycerides: 81 mg/dL (ref 0–149)

## 2021-11-15 LAB — CBC
Hematocrit: 42.2 % (ref 34.0–46.6)
Hemoglobin: 13.7 g/dL (ref 11.1–15.9)
MCH: 28.5 pg (ref 26.6–33.0)
MCHC: 32.5 g/dL (ref 31.5–35.7)
MCV: 88 fL (ref 79–97)
Platelets: 360 10*3/uL (ref 150–450)
RBC: 4.8 x10E6/uL (ref 3.77–5.28)
RDW: 12 % (ref 11.7–15.4)
WBC: 8.8 10*3/uL (ref 3.4–10.8)

## 2021-11-15 NOTE — Progress Notes (Incomplete)
Phone [phone]   Subjective:  Patient presents today for their annual physical. Chief complaint-noted.   See problem oriented charting- ROS- full  review of systems was completed and negative except for: ***  The following were reviewed and entered/updated in epic: Past Medical History:  Diagnosis Date   Abnormal thyroid function test 07/01/2013   Anxiety    Arthritis    Cancer ([place])    Coronary artery disease    Dermatitis 11/25/2013   Glaucoma 11/02/2012   HTN (hypertension) 11/02/2012   hx: breast cancer, IDC (Microinvasive) w DCIS, 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 - 07/10/2011   Hyperlipidemia    Hypothyroid    Obesity    Patient Active Problem List   Diagnosis Date Noted   PAD (peripheral artery disease) ([place]) 09/15/2020   Aortic atherosclerosis ([place]) 09/15/2020   Statin myopathy 07/01/2019   Hypokalemia 06/02/2019   CAD (coronary artery disease) s/p stent mCx 05/2019 06/02/2019   NSTEMI (non-ST elevated myocardial infarction) ([place]) 05/31/2019   Chest pain 12/29/2018   History of adenomatous polyp of colon 09/30/2018   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, moderate stage 06/11/2018   Postsurgical states following surgery of eye and adnexa 06/11/2018   Capsular glaucoma of right eye with pseudoexfoliation (PXF) of lens, severe stage 05/07/2018   Primary open-angle glaucoma 11/20/2017   Pseudophakia of both eyes 11/20/2017   Grieving 02/18/2017   Osteoporosis 11/05/2016   Medicare annual wellness visit, subsequent 09/26/2014   Hypothyroidism 07/01/2013   Hypertension 11/02/2012   Vitamin D deficiency 11/02/2012   Hyperlipidemia 07/24/2011   Obesity (BMI 30-39.9) 07/24/2011   hx: breast cancer, IDC (Microinvasive) w DCIS, 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 - 07/10/2011   Past Surgical History:  Procedure Laterality Date   AUGMENTATION MAMMAPLASTY Left    05/2006   BREAST SURGERY  10/2005   Left Mastectomy, reconstruction, reduction on right. Dr. Harlow Mares    CARDIAC CATHETERIZATION     COLONOSCOPY     CORONARY STENT INTERVENTION N/A 06/01/2019   Procedure: CORONARY STENT INTERVENTION;  Surgeon: Sherren Mocha, MD;  Location: [place] [facility];  Service: Cardiovascular;  Laterality: N/A;   CRYOTHERAPY     GYN for CIN III   DILATION AND CURETTAGE OF UTERUS     EXCISION MORTON'S NEUROMA     right foot   EYE SURGERY Right 05/08/2018   LEFT HEART CATH AND CORONARY ANGIOGRAPHY N/A 06/01/2019   Procedure: LEFT HEART CATH AND CORONARY ANGIOGRAPHY;  Surgeon: Sherren Mocha, MD;  Location: [place] [facility];  Service: Cardiovascular;  Laterality: N/A;   MASTECTOMY     REDUCTION MAMMAPLASTY Right     Family History  Problem Relation Age of Onset   Pneumonia Mother    Hyperlipidemia Mother    Parkinsonism Mother    CAD Mother    Other Father        bilateral subdural hematoma   Colon cancer Paternal Grandmother    Hyperlipidemia Sister    Prostate cancer Neg Hx    Breast cancer Neg Hx    Heart disease Neg Hx    Diabetes Neg Hx     Medications- reviewed and updated Current Outpatient Medications  Medication Sig Dispense Refill   amLODipine (NORVASC) 2.5 MG tablet Take 1 tablet (2.5 mg total) by mouth at bedtime. 180 tablet 3   aspirin EC 81 MG tablet Take 81 mg by mouth daily.     Bimatoprost (LUMIGAN OP) Place 1 drop into both eyes at bedtime.  chlorthalidone (HYGROTON) 25 MG tablet Take 1 tablet (25 mg total) by mouth daily. 90 tablet 1   Cholecalciferol (VITAMIN D-3) 5000 UNITS TABS Take 5,000 mg by mouth daily.     dorzolamide-timolol (COSOPT) 22.3-6.8 MG/ML ophthalmic solution Place 1 drop into both eyes 2 (two) times daily.      famotidine (PEPCID) 20 MG tablet TAKE 1 TABLET BY MOUTH TWICE DAILY. 180 tablet 3   icosapent Ethyl (VASCEPA) 1 g capsule Take 1 capsule (1 g total) by mouth 2 (two) times daily. 180 capsule 3   levothyroxine (SYNTHROID) 50 MCG tablet TAKE ONE TABLET BEFORE BREAKFAST. 90 tablet 0   metoprolol tartrate  (LOPRESSOR) 25 MG tablet TAKE ONE TABLET BY MOUTH TWICE DAILY 180 tablet 0   NEXLETOL 180 MG TABS TAKE ONE TABLET BY MOUTH DAILY 90 tablet 0   nitroGLYCERIN (NITROSTAT) 0.4 MG SL tablet Place 1 tablet (0.4 mg total) under the tongue every 5 (five) minutes x 3 doses as needed for chest pain. 25 tablet 3   pilocarpine (PILOCAR) 1 % ophthalmic solution Place 1 drop into both eyes 2 (two) times daily.      Polyethyl Glycol-Propyl Glycol (SYSTANE OP) Place 1-2 drops into both eyes as needed (for dry eyes).      potassium chloride (KLOR-CON) 10 MEQ tablet Take 2 tablets (20 mEq total) by mouth daily. 180 tablet 3   Probiotic Product (PROBIOTIC DAILY) CAPS Take 1 capsule by mouth daily. Reported on 10/10/2015     REPATHA SURECLICK 742 MG/ML SOAJ INJECT CONTENTS OF 1 PEN ('140MG'$ ) INTO SKIN EVERY 14 DAYS. 2 mL 11   No current facility-administered medications for this visit.    Allergies-reviewed and updated Allergies  Allergen Reactions   Adhesive [Tape] Other (See Comments)    Causes skin to turn red where touched   Rosuvastatin Other (See Comments)    Joint & muscle aches, sleep issues.    Social History   Social History Narrative   Married 1981. 1 daughter, 2 step sons. 4 grandchildren      Oncologist      Hobbies: going out to eat, activities with church including cancer support group at church   Objective  Objective:  There were no vitals taken for this visit. Gen: NAD, resting comfortably HEENT: Mucous membranes are moist. Oropharynx normal Neck: no thyromegaly CV: RRR no murmurs rubs or gallops Lungs: CTAB no crackles, wheeze, rhonchi Abdomen: soft/nontender/nondistended/normal bowel sounds. No rebound or guarding.  Ext: no edema Skin: warm, dry Neuro: grossly normal, moves all extremities, PERRLA***   Assessment and Plan   81 y.o. female presenting for annual physical.  Health Maintenance counseling: 1. Anticipatory guidance: Patient counseled regarding regular  dental exams ***q6 months, eye exams-yearly or every 6 months, ***,  avoiding smoking and second hand smoke*** , limiting alcohol to 1 beverage per day-occasionally once a month may have small glass of wine. *** .   2. Risk factor reduction:  Advised patient of need for regular exercise and diet rich and fruits and vegetables to reduce risk of heart attack and stroke.  Exercise- - rides stationary bike for 20 minutes a day.***. Diet-followed by nutritionist  Every 3 weeks. Limited red meat.  Weight down 10 pounds from last year-has done an excellent job with maintaining this***.  Wt Readings from Last 3 Encounters:  11/08/21 150 lb (68 kg)  11/07/21 149 lb 12.8 oz (67.9 kg)  08/30/21 149 lb 12.8 oz (67.9 kg)   3. Immunizations/screenings/ancillary studies Immunization  History  Administered Date(s) Administered   Fluad Quad(high Dose 65+) 04/29/2019, 05/12/2020, 06/01/2021   Influenza Split 07/24/2011   Influenza Whole 06/15/2009   Influenza, High Dose Seasonal PF 06/29/2013, 07/05/2015, 05/10/2016, 05/21/2017, 06/18/2018   Influenza,inj,Quad PF,6+ Mos 06/21/2014, 06/14/2016   PFIZER(Purple Top)SARS-COV-2 Vaccination 10/05/2019, 10/26/2019, 06/17/2020   Pfizer Covid-19 Vaccine Bivalent Booster 11yr & up 07/10/2021   Pneumococcal Conjugate-13 04/15/2014   Pneumococcal Polysaccharide-23 07/17/2000, 10/02/2016   Tdap 07/17/2010   Zoster Recombinat (Shingrix) 10/15/2018, 03/17/2019   There are no preventive care reminders to display for this patient. 4. Cervical cancer screening-had a normal Pap smear at age 81-wouldnot advise repeat *** 5. Breast cancer screening-  breast exam declined  *** and mammogram 9/922 with 1 year repeat planned 6. Colon cancer screening - 09/22/18 with 5 year repeat planned due to polyp history*** 7. Skin cancer screening- -have discussed following up with Dr. JMartinique she is going to call. advised regular sunscreen use. ***advised regular sunscreen use. Denies  worrisome, changing, or new skin lesions.  8. Birth control/STD check- Declined not sexually active.  *** 9. Osteoporosis screening at 666 DEXA 10/29/16*** -never smoker - ***  Status of chronic or acute concerns   ***repatha trial i/o praluent per patient preference. slight rash ok per lipid clinic ***Deposit is down for [facility] ***breast exam on left at least (mastectomy) ***nutrionist every 3 weeks   #CAD-follows with Dr. RHarrington Challengeron brilinta and aspirin #PAD listed by Dr. RMable Filldoes have aortic atherosclerosis #hyperlipidemia S: Medication: Repeatha 140 mg every 2 weeks and Vascepa 1 g-also on clear study*** (stopping mid 2022) Lab Results  Component Value Date   CHOL 174 04/20/2021   HDL 70.90 04/20/2021   LDLCALC 83 04/20/2021   LDLDIRECT 54.0 06/01/2021   TRIG 101.0 04/20/2021   CHOLHDL 2 04/20/2021   A/P: ***  #hypertension S: medication: Metoprolol 25 mg twice daily, amlodipine 2.5 mg, chlorthalidone 25 mg with potassium daily Patient continued to work with nutritionist every few weeks and was a member at [employer] for exercise  Home readings #s: *** BP Readings from Last 3 Encounters:  11/08/21 136/74  11/07/21 140/80  08/30/21 130/62  A/P: ***  #hypothyroidism S: compliant On thyroid medication-levothyroxine 50 mcg Lab Results  Component Value Date   TSH 1.85 04/20/2021    A/P:***   #Vitamin D deficiency S: Medication: 5000 units daily  Last vitamin D Lab Results  Component Value Date   VD25OH 46.85 04/20/2021   A/P: ***   #Osteoporosis- patient had not tolerated Prolia due to achiness and had a lot of dental issues so not thought ideal candidate for bisphosphonates   Recommended follow up: No follow-ups on file. Future Appointments  Date Time Provider [place] 12/14/2021  9:40 AM HYong Channel SBrayton Mars MD [facility]    No chief complaint on file.  Lab/Order associations:*** fasting No diagnosis found.  No orders of the defined  types were placed in this encounter.   I,Jada Bradford,acting as a scribe for SGarret Reddish MD.,have documented all relevant documentation on the behalf of SGarret Reddish MD,as directed by  SGarret Reddish MD while in the presence of SGarret Reddish MD.  *** Return precautions advised.  JBurnett Corrente

## 2021-11-21 ENCOUNTER — Other Ambulatory Visit: Payer: Self-pay | Admitting: Family Medicine

## 2021-12-14 ENCOUNTER — Ambulatory Visit (INDEPENDENT_AMBULATORY_CARE_PROVIDER_SITE_OTHER): Payer: Medicare PPO | Admitting: Family Medicine

## 2021-12-14 ENCOUNTER — Encounter: Payer: Self-pay | Admitting: Family Medicine

## 2021-12-14 VITALS — BP 124/62 | HR 72 | Temp 97.9°F | Ht 63.0 in | Wt 153.2 lb

## 2021-12-14 DIAGNOSIS — Z0000 Encounter for general adult medical examination without abnormal findings: Secondary | ICD-10-CM | POA: Diagnosis not present

## 2021-12-14 DIAGNOSIS — I2510 Atherosclerotic heart disease of native coronary artery without angina pectoris: Secondary | ICD-10-CM

## 2021-12-14 DIAGNOSIS — E782 Mixed hyperlipidemia: Secondary | ICD-10-CM

## 2021-12-14 DIAGNOSIS — I10 Essential (primary) hypertension: Secondary | ICD-10-CM | POA: Diagnosis not present

## 2021-12-14 DIAGNOSIS — E559 Vitamin D deficiency, unspecified: Secondary | ICD-10-CM

## 2021-12-14 DIAGNOSIS — I700 Atherosclerosis of aorta: Secondary | ICD-10-CM

## 2021-12-14 DIAGNOSIS — E039 Hypothyroidism, unspecified: Secondary | ICD-10-CM

## 2021-12-14 DIAGNOSIS — I739 Peripheral vascular disease, unspecified: Secondary | ICD-10-CM

## 2021-12-14 LAB — CBC WITH DIFFERENTIAL/PLATELET
Basophils Absolute: 0.1 10*3/uL (ref 0.0–0.1)
Basophils Relative: 1.5 % (ref 0.0–3.0)
Eosinophils Absolute: 0.2 10*3/uL (ref 0.0–0.7)
Eosinophils Relative: 2.1 % (ref 0.0–5.0)
HCT: 40.7 % (ref 36.0–46.0)
Hemoglobin: 13.5 g/dL (ref 12.0–15.0)
Lymphocytes Relative: 27 % (ref 12.0–46.0)
Lymphs Abs: 2 10*3/uL (ref 0.7–4.0)
MCHC: 33.1 g/dL (ref 30.0–36.0)
MCV: 89.9 fl (ref 78.0–100.0)
Monocytes Absolute: 0.7 10*3/uL (ref 0.1–1.0)
Monocytes Relative: 9.1 % (ref 3.0–12.0)
Neutro Abs: 4.5 10*3/uL (ref 1.4–7.7)
Neutrophils Relative %: 60.3 % (ref 43.0–77.0)
Platelets: 318 10*3/uL (ref 150.0–400.0)
RBC: 4.53 Mil/uL (ref 3.87–5.11)
RDW: 13.1 % (ref 11.5–15.5)
WBC: 7.5 10*3/uL (ref 4.0–10.5)

## 2021-12-14 LAB — COMPREHENSIVE METABOLIC PANEL
ALT: 17 U/L (ref 0–35)
AST: 22 U/L (ref 0–37)
Albumin: 4.8 g/dL (ref 3.5–5.2)
Alkaline Phosphatase: 42 U/L (ref 39–117)
BUN: 21 mg/dL (ref 6–23)
CO2: 31 mEq/L (ref 19–32)
Calcium: 9.8 mg/dL (ref 8.4–10.5)
Chloride: 101 mEq/L (ref 96–112)
Creatinine, Ser: 0.99 mg/dL (ref 0.40–1.20)
GFR: 53.91 mL/min — ABNORMAL LOW (ref >60.00)
Glucose, Bld: 90 mg/dL (ref 70–99)
Potassium: 3.5 mEq/L (ref 3.5–5.1)
Sodium: 142 mEq/L (ref 135–145)
Total Bilirubin: 0.9 mg/dL (ref 0.2–1.2)
Total Protein: 7 g/dL (ref 6.0–8.3)

## 2021-12-14 LAB — TSH: TSH: 1.83 u[IU]/mL (ref 0.35–5.50)

## 2021-12-14 LAB — VITAMIN D 25 HYDROXY (VIT D DEFICIENCY, FRACTURES): VITD: 58.15 ng/mL (ref 30.00–100.00)

## 2021-12-14 NOTE — Patient Instructions (Addendum)
Please stop by lab before you go ?If you have mychart- we will send your results within 3 business days of [place] receiving them.  ?If you do not have mychart- we will call you about results within 5 business days of [place] receiving them.  ?*please also note that you will see labs on mychart as soon as they post. I will later go in and write notes on them- will say "notes from Dr. Yong Channel"  ? ?Glad you are doing so well!  ? ?Recommended follow up: Return in about 4 months (around 04/15/2022) for followup or sooner if needed.Schedule b4 you leave. ?

## 2021-12-29 ENCOUNTER — Other Ambulatory Visit: Payer: Self-pay | Admitting: Family Medicine

## 2022-01-10 ENCOUNTER — Other Ambulatory Visit: Payer: Self-pay | Admitting: Internal Medicine

## 2022-01-22 ENCOUNTER — Other Ambulatory Visit: Payer: Self-pay

## 2022-01-22 ENCOUNTER — Telehealth: Payer: Self-pay | Admitting: Family Medicine

## 2022-01-22 ENCOUNTER — Encounter (HOSPITAL_BASED_OUTPATIENT_CLINIC_OR_DEPARTMENT_OTHER): Payer: Self-pay | Admitting: Obstetrics and Gynecology

## 2022-01-22 ENCOUNTER — Emergency Department (HOSPITAL_BASED_OUTPATIENT_CLINIC_OR_DEPARTMENT_OTHER)
Admission: EM | Admit: 2022-01-22 | Discharge: 2022-01-22 | Disposition: A | Discharge status: Home / Self Care | Payer: Medicare PPO | Attending: Emergency Medicine | Admitting: Emergency Medicine

## 2022-01-22 DIAGNOSIS — I10 Essential (primary) hypertension: Secondary | ICD-10-CM | POA: Insufficient documentation

## 2022-01-22 DIAGNOSIS — Z7982 Long term (current) use of aspirin: Secondary | ICD-10-CM | POA: Diagnosis not present

## 2022-01-22 DIAGNOSIS — Z79899 Other long term (current) drug therapy: Secondary | ICD-10-CM | POA: Diagnosis not present

## 2022-01-22 DIAGNOSIS — I159 Secondary hypertension, unspecified: Secondary | ICD-10-CM

## 2022-01-22 DIAGNOSIS — R030 Elevated blood-pressure reading, without diagnosis of hypertension: Secondary | ICD-10-CM | POA: Diagnosis present

## 2022-01-22 NOTE — Telephone Encounter (Signed)
Pt states high BP this morning: 175/81 and 175/102.  ? ?Pt states she is also dealing with left shoulder pain at night with night sweats for a couple of months.  ? ?Patient was referred to nurse triage.  ? ?Additionally, pt states her Brother In Law just passed away last week (week of 01/15/22) ?

## 2022-01-22 NOTE — ED Provider Notes (Signed)
?[facility] DEPT ?Provider Note ? ? ?CSN: [id number] ?Arrival date & time: 01/22/22  1057 ? ?  ? ?History ? ?Chief Complaint  ?Patient presents with  ? Hypertension  ? ? ?[name] is a 81 y.o. female. ? ?Patient is here due to elevated blood pressure at home in the 170s.  No chest pain no stroke symptoms.  Recent changes to her blood pressure medications.  She has been dealing with the loss of a family member recently.  Having some anxiety.  Denies any abdominal pain.  Nothing has made it worse or better. ? ? ? ?  ? ?Home Medications ?Prior to Admission medications   ?Medication Sig Start Date End Date Taking? Authorizing Provider  ?amLODipine (NORVASC) 2.5 MG tablet Take 1 tablet (2.5 mg total) by mouth at bedtime. 07/21/21   Fay Records, MD  ?aspirin EC 81 MG tablet Take 81 mg by mouth daily.    [provider]  ?Bimatoprost (LUMIGAN OP) Place 1 drop into both eyes at bedtime.     [provider]  ?chlorthalidone (HYGROTON) 25 MG tablet Take 1 tablet (25 mg total) by mouth daily. 01/10/22   Fay Records, MD  ?Cholecalciferol (VITAMIN D-3) 5000 UNITS TABS Take 5,000 mg by mouth daily.    [provider]  ?dorzolamide-timolol (COSOPT) 22.3-6.8 MG/ML ophthalmic solution Place 1 drop into both eyes 2 (two) times daily.     [provider]  ?famotidine (PEPCID) 20 MG tablet TAKE 1 TABLET BY MOUTH TWICE DAILY. 05/16/21   Marin Olp, MD  ?icosapent Ethyl (VASCEPA) 1 g capsule Take 1 capsule (1 g total) by mouth 2 (two) times daily. 09/14/21   Fay Records, MD  ?levothyroxine (SYNTHROID) 50 MCG tablet TAKE ONE TABLET BEFORE BREAKFAST. 12/29/21   Marin Olp, MD  ?metoprolol tartrate (LOPRESSOR) 25 MG tablet TAKE ONE TABLET BY MOUTH TWICE DAILY 11/21/21   Marin Olp, MD  ?NEXLETOL 180 MG TABS TAKE ONE TABLET BY MOUTH DAILY 08/22/21   Marin Olp, MD  ?nitroGLYCERIN (NITROSTAT) 0.4 MG SL tablet Place 1 tablet (0.4 mg total) under the  tongue every 5 (five) minutes x 3 doses as needed for chest pain. 01/17/21   Fay Records, MD  ?pilocarpine (PILOCAR) 1 % ophthalmic solution Place 1 drop into both eyes 2 (two) times daily.     [provider]  ?Polyethyl Glycol-Propyl Glycol (SYSTANE OP) Place 1-2 drops into both eyes as needed (for dry eyes).     [provider]  ?potassium chloride (KLOR-CON) 10 MEQ tablet Take 2 tablets (20 mEq total) by mouth daily. 08/30/21   Marin Olp, MD  ?Probiotic Product (PROBIOTIC DAILY) CAPS Take 1 capsule by mouth daily. Reported on 10/10/2015    [provider]  ?REPATHA SURECLICK 263 MG/ML SOAJ INJECT CONTENTS OF 1 PEN ('140MG'$ ) INTO SKIN EVERY 14 DAYS. 10/19/21   Fay Records, MD  ?   ? ?Allergies    ?Adhesive [tape] and Rosuvastatin   ? ?Review of Systems   ?Review of Systems ? ?Physical Exam ?Updated Vital Signs ?BP (!) 140/54 (BP Location: Right Arm)   Pulse 69   Temp 97.9 ?F (36.6 ?C)   Resp 16   Ht '5\' 3"'$  (1.6 m)   Wt 69 kg   SpO2 96%   BMI 26.95 kg/m?  ?Physical Exam ?Vitals and nursing note reviewed.  ?Constitutional:   ?   General: She is not in acute distress. ?  Appearance: She is well-developed. She is not ill-appearing.  ?HENT:  ?   Head: Normocephalic and atraumatic.  ?   Nose: Nose normal.  ?   Mouth/Throat:  ?   Mouth: Mucous membranes are moist.  ?Eyes:  ?   Extraocular Movements: Extraocular movements intact.  ?   Conjunctiva/sclera: Conjunctivae normal.  ?   Pupils: Pupils are equal, round, and reactive to light.  ?Cardiovascular:  ?   Rate and Rhythm: Normal rate and regular rhythm.  ?   Pulses: Normal pulses.  ?   Heart sounds: Normal heart sounds. No murmur heard. ?Pulmonary:  ?   Effort: Pulmonary effort is normal. No respiratory distress.  ?   Breath sounds: Normal breath sounds.  ?Abdominal:  ?   Palpations: Abdomen is soft.  ?   Tenderness: There is no abdominal tenderness.  ?Musculoskeletal:     ?   General: No swelling.  ?   Cervical back: Normal  range of motion and neck supple.  ?Skin: ?   General: Skin is warm and dry.  ?   Capillary Refill: Capillary refill takes less than 2 seconds.  ?Neurological:  ?   General: No focal deficit present.  ?   Mental Status: She is alert and oriented to person, place, and time.  ?   Cranial Nerves: No cranial nerve deficit.  ?   Sensory: No sensory deficit.  ?   Motor: No weakness.  ?   Coordination: Coordination normal.  ?Psychiatric:     ?   Mood and Affect: Mood normal.  ? ? ?ED Results / Procedures / Treatments   ?Labs ?(all labs ordered are listed, but only abnormal results are displayed) ?Labs Reviewed - No data to display ? ?EKG ?EKG Interpretation ? ?Date/Time:  Monday Jan 22 2022 11:19:25 EDT ?Ventricular Rate:  74 ?PR Interval:  166 ?QRS Duration: 89 ?QT Interval:  373 ?QTC Calculation: 414 ?R Axis:   19 ?Text Interpretation: Sinus rhythm Confirmed by Lennice Sites [phone]) on 01/22/2022 11:32:23 AM ? ?Radiology ?No results found. ? ?Procedures ?Procedures  ? ? ?Medications Ordered in ED ?Medications - No data to display ? ?ED Course/ Medical Decision Making/ A&P ?  ?                        ?Medical Decision Making ? ?[name] is here with high blood pressure.  Blood pressure here 140/54.  Overall normal vitals.  Very well-appearing.  No chest pain.  No stroke symptoms.  Suspect symptoms are secondary to anxiety and recent loss of a family member.  Blood pressure is doing well here.  She is asymptomatic.  No concern for stroke, ACS or other acute process.  EKG shows sinus rhythm.  No ischemic changes.  Given reassurance and discharged in ED in good condition. ? ?This chart was dictated using voice recognition software.  Despite best efforts to proofread,  errors can occur which can change the documentation meaning.  ? ? ? ? ? ? ? ?Final Clinical Impression(s) / ED Diagnoses ?Final diagnoses:  ?Secondary hypertension  ? ? ?Rx / DC Orders ?ED Discharge Orders   ? ? None  ? ?  ? ? ?  ?Lennice Sites,  DO ?01/22/22 1146 ? ?

## 2022-01-22 NOTE — Telephone Encounter (Signed)
Please schedule f/u for pt.  

## 2022-01-22 NOTE — Telephone Encounter (Signed)
Patient was triaged- Patient was sent to ED - Patient at ED now.  ? ?Patient ?Name: ?Guy FO ?RBES ?Gender: Female ?DOB: 1940-11-28 ?Age: 81 Y 23 M 19 D ?Return ?Phone ?Number: ?[phone] ?(Primary), ?[phone] ?(Secondary) ?Address: ?City/ ?State/ ?Zip: ?[place] ? 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 Her ?medication was cut she now takes one pill instead ?of 2. She recently had a death in the family and ?says it may be stress but she is also experiencing ?night sweats and is concerned it may be related to ?her heart. She has some pain on her shoulder as ?well. She feels tired and is not sleeping as well. ?Her sleeping has changed. ?Translation No ?Nurse Assessment ?Nurse: Vallery Sa, RN, [place] Date/Time (Eastern Time): 01/22/2022 9:56:06 AM ?Confirm and document reason for call. If ?symptomatic, describe symptoms. ?---Maebel states she had a death in her family recently ?that she thinks has caused her blood pressure to ?increase. No severe breathing difficulty. She has had ?chest tightness (rated as a 3 on the 1 to 10 scale). Alert ?and responsive. ?Does the patient have any new or worsening ?symptoms? ---Yes ?Will a triage be completed? ---Yes ?Related visit to physician within the last 2 weeks? ---No ?Does the PT have any chronic conditions? (i.e. ?diabetes, asthma, this includes High risk factors for ?pregnancy, etc.) ?---Yes ?List chronic conditions. ---High Blood Pressure, Heart Attack 2 years ago, ?Acid Reflux ?Is this a behavioral health or substance abuse  call? ---No ?PLEASE NOTE: All timestamps contained within this report are represented as [place] Standard Time. ?CONFIDENTIALTY NOTICE: This fax transmission is intended only for the addressee. It contains information that is legally privileged, confidential or ?otherwise protected from use or disclosure. If you are not the intended recipient, you are strictly prohibited from reviewing, disclosing, copying using ?or disseminating any of this information or taking any action in reliance on or regarding this information. If you have received this fax in error, please ?notify us immediately by telephone so that we can arrange for its return to [place]. Phone: [phone], Toll-Free: [phone], Fax: [phone] ?Page: 2 of 2 ?Call Id: [id number] ?Guidelines ?Guideline Title Affirmed Question Affirmed Notes Nurse Date/Time (Eastern ?Time) ?Chest Pain Dizziness or ?lightheadedness ?Vallery Sa, RN, [facility] 01/22/2022 10:01:40 ?AM ?Disp. Time (Eastern ?Time) Disposition Final User ?01/22/2022 10:05:37 AM Go to ED Now Yes Vallery Sa, RN, [facility] [place] ?Caller Disagree/Comply Comply ?Caller Understands Yes ?PreDisposition Call Doctor ?Care Advice Given Per Guideline ?GO TO ED NOW: * You need to be seen in the Emergency Department. * Go to the ED at ___________ [place] ?now. Drive carefully. NOTE TO TRIAGER - DRIVING: * Another adult should drive. * Patient should not delay going to the ?emergency department. * If immediate transportation is not available via car, rideshare (e.g., Lyft, [employer]), or taxi, then the patient ?should be instructed to call [id number]. BRING MEDICINES: * Bring a list of your current medicines when you go to the Emergency ?Department (ER). * Bring the pill bottles too. This will help the doctor (or NP/PA) to make certain you are taking the right medicines ?and the right dose.  CALL EMS IF: * Severe difficulty breathing occurs * Passes out or becomes too weak to stand * You become ?worse CARE ADVICE given per Chest  Pain (Adult) guideline. ?Referrals ?[facility] - ED ?

## 2022-01-22 NOTE — ED Triage Notes (Signed)
Patient reports to the ER for HTN. Patient reports it was 170's/something this morning. Patient reports she has only been taking 1 BP tablet at night. Patient reports she has had significant stressors recently including a death in the family and endorses night sweats. Denies headaches or chest pain.  ?

## 2022-01-22 NOTE — ED Notes (Signed)
Discharge paperwork give and understood. ?

## 2022-01-23 ENCOUNTER — Telehealth: Payer: Self-pay | Admitting: Family Medicine

## 2022-01-23 NOTE — Telephone Encounter (Signed)
Pt states ED 05/15 instructed her to follow up with PCP. ? ?Scheduled for 05/25. ? ?Pt will ask at 05/25 appt if she should keep the 04/18/22 appt that is currently scheduled. ?

## 2022-02-01 ENCOUNTER — Ambulatory Visit: Payer: Medicare PPO | Admitting: Family Medicine

## 2022-02-01 ENCOUNTER — Encounter: Payer: Self-pay | Admitting: Family Medicine

## 2022-02-01 VITALS — BP 132/60 | HR 85 | Temp 97.6°F | Ht 63.0 in | Wt 152.6 lb

## 2022-02-01 DIAGNOSIS — E782 Mixed hyperlipidemia: Secondary | ICD-10-CM

## 2022-02-01 DIAGNOSIS — E039 Hypothyroidism, unspecified: Secondary | ICD-10-CM | POA: Diagnosis not present

## 2022-02-01 DIAGNOSIS — F439 Reaction to severe stress, unspecified: Secondary | ICD-10-CM | POA: Diagnosis not present

## 2022-02-01 DIAGNOSIS — I10 Essential (primary) hypertension: Secondary | ICD-10-CM

## 2022-02-01 NOTE — Progress Notes (Signed)
Phone [phone] In person visit   Subjective:   [name] is a 81 y.o. year old very pleasant female patient who presents for/with See problem oriented charting Chief Complaint  Patient presents with   Follow-up    Pt is f/u from ER visit for secondary HTN.   Hypertension   Hot Flashes    Pt c/o hot flashes at night sometime that started a couple of months ago.   Past Medical History-  Patient Active Problem List   Diagnosis Date Noted   CAD (coronary artery disease) s/p stent mCx 05/2019 06/02/2019    Priority: High   Grieving 02/18/2017    Priority: High   Osteoporosis 11/05/2016    Priority: High   Aortic atherosclerosis ([place]) 09/15/2020    Priority: Medium    Chest pain 12/29/2018    Priority: Medium    Primary open-angle glaucoma 11/20/2017    Priority: Medium    Hypothyroidism 07/01/2013    Priority: Medium    Hypertension 11/02/2012    Priority: Medium    Hyperlipidemia 07/24/2011    Priority: Medium    hx: breast cancer, IDC (Microinvasive) w DCIS, 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 - 07/10/2011    Priority: Medium    History of adenomatous polyp of colon 09/30/2018    Priority: Low   Medicare annual wellness visit, subsequent 09/26/2014    Priority: Low   Vitamin D deficiency 11/02/2012    Priority: Low   Obesity (BMI 30-39.9) 07/24/2011    Priority: Low   PAD (peripheral artery disease) ([place]) 09/15/2020   Statin myopathy 07/01/2019   Hypokalemia 06/02/2019   NSTEMI (non-ST elevated myocardial infarction) ([place]) 05/31/2019   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 180 tablet 3   aspirin EC 81 MG tablet Take 81 mg by mouth daily.     Bimatoprost (LUMIGAN OP) Place 1 drop into both eyes at bedtime.      chlorthalidone (HYGROTON) 25 MG tablet Take 1 tablet (25 mg total) by mouth daily. 90 tablet 1   Cholecalciferol (VITAMIN D-3) 5000 UNITS TABS Take 5,000 mg by mouth daily.     dorzolamide-timolol (COSOPT) 22.3-6.8 MG/ML ophthalmic solution Place 1 drop into both eyes 2 (two) times daily.      famotidine (PEPCID) 20 MG tablet TAKE 1 TABLET BY MOUTH TWICE DAILY. 180 tablet 3   icosapent Ethyl (VASCEPA) 1 g capsule Take 1 capsule (1 g total) by mouth 2 (two) times daily. 180 capsule 3   levothyroxine (SYNTHROID) 50 MCG tablet TAKE ONE TABLET BEFORE BREAKFAST. 90 tablet 0   metoprolol tartrate (LOPRESSOR) 25 MG tablet TAKE ONE TABLET BY MOUTH TWICE DAILY 180 tablet 0   NEXLETOL 180 MG TABS TAKE ONE TABLET BY MOUTH DAILY 90 tablet 0   nitroGLYCERIN (NITROSTAT) 0.4 MG SL tablet Place 1 tablet (0.4 mg total) under the tongue every 5 (five) minutes x 3 doses as needed for chest pain. 25 tablet 3   pilocarpine (PILOCAR) 1 % ophthalmic solution Place 1 drop into both eyes 2 (two) times daily.      Polyethyl  Glycol-Propyl Glycol (SYSTANE OP) Place 1-2 drops into both eyes as needed (for dry eyes).      potassium chloride (KLOR-CON) 10 MEQ tablet Take 2 tablets (20 mEq total) by mouth daily. 180 tablet 3   Probiotic Product (PROBIOTIC DAILY) CAPS Take 1 capsule by mouth daily. Reported on 10/10/2015     REPATHA SURECLICK 767 MG/ML SOAJ INJECT CONTENTS OF 1 PEN ('140MG'$ ) INTO SKIN EVERY 14 DAYS. 2 mL 11   No current facility-administered medications for this visit.     Objective:  BP 132/60   Pulse 85   Temp 97.6 F (36.4 C)   Ht '5\' 3"'$  (1.6 m)   Wt 152 lb 9.6 oz (69.2 kg)   SpO2 98%   BMI 27.03 kg/m  Gen: NAD, resting comfortably CV: RRR no murmurs rubs or gallops Lungs: CTAB no crackles, wheeze, rhonchi Abdomen:  soft/nontender/nondistended/normal bowel sounds.  Ext: trace edema Skin: warm, dry    Assessment and Plan   #Stress/anxiety #Hot flashes S: She presented to the emergency department at [facility] on 01/22/2022 due to elevated blood pressure up to the 170s.  No chest pain or strokelike symptoms were noted.  She reported that time dealing with the loss of a family member.  At the time of evaluation emergency department blood pressure was on 140/54 and the presumption that symptoms were related to stress/anxiety/loss of family member (brother in law 2 weeks ago from colorectal cancer- from her 1 sister). She reports she is going to visit tomorrow  Patient also reports intermittent hot flashes at night starting a few months (perhaps 3) ago but improving as stress has improved. Hot around neck but not sweating through clothes.  Labs at her physical on 12/14/2021 within this timeframe were normal including CBC, CMP, TSH-she did not specifically mention A/P: stress and anxiety are improving after recent loss and BP has trended down- see below. Sounds like hot flashes have come with higher stress but also improving- labs looked good in April so do not belive we need to check labs again  -no unintentional weight loss or fatigue- doubt B symptoms  #CAD-follows with Dr. Harrington Challenger on aspirin #hyperlipidemia S: Medication: Repeatha 140 mg every 2 weeks and Vascepa 1 g-also on clear study (stopping mid 2022 but now prescribed, as well as nexlitol - is going to get meds through [facility] but still at [facility]- these 3 that are most expensive - no chest pain or shortness of breath Lab Results  Component Value Date   CHOL 174 04/20/2021   HDL 70.90 04/20/2021   LDLCALC 83 04/20/2021   LDLDIRECT 54.0 06/01/2021   TRIG 101.0 04/20/2021   CHOLHDL 2 04/20/2021   A/P: really good control of LDL on last check at 54 and all other #s look excellent. CAD asymptomatic- continue current  meds  #hypertension S: medication: Metoprolol 25 mg twice daily, amlodipine 2.5 mg, chlorthalidone 25 mg with potassium daily Home readings #s: readings for most part <140/90 other than elevated one above BP Readings from Last 3 Encounters:  02/01/22 132/60  01/22/22 (!) 140/54  12/14/21 124/62  A/P: Controlled. Continue current medications.   #hypothyroidism S: compliant On thyroid medication-levothyroxine 50 mcg Lab Results  Component Value Date   TSH 1.83 12/14/2021  A/P: Controlled on recent check. Continue current medications.    Recommended follow up: Return for next already scheduled visit or sooner if needed. Future Appointments  Date Time Provider [place]  04/18/2022  9:00 AM Marin Olp, MD  [facility]   Lab/Order associations:   ICD-10-CM   1. Primary hypertension  I10     2. Mixed hyperlipidemia  E78.2     3. Hypothyroidism, unspecified type  E03.9     4. Stress  F43.9       No orders of the defined types were placed in this encounter.   Return precautions advised.  Garret Reddish, MD

## 2022-02-01 NOTE — Patient Instructions (Addendum)
Glad you are doing  so much better and blood pressure looks great!   Hold off on labs with them being done in april  Recommended follow up: Return for next already scheduled visit or sooner if needed.

## 2022-02-06 ENCOUNTER — Other Ambulatory Visit: Payer: Self-pay

## 2022-02-06 MED ORDER — ICOSAPENT ETHYL 1 G PO CAPS: 1.0000 g | ORAL_CAPSULE | Freq: Two times a day (BID) | ORAL | 3 refills | Status: DC

## 2022-02-06 MED ORDER — REPATHA SURECLICK 140 MG/ML ~~LOC~~ SOAJ: SUBCUTANEOUS | 11 refills | Status: DC

## 2022-02-09 ENCOUNTER — Other Ambulatory Visit: Payer: Self-pay

## 2022-02-09 MED ORDER — NEXLETOL 180 MG PO TABS
1.0000 | ORAL_TABLET | Freq: Every day | ORAL | 2 refills | Status: DC
Start: 2022-02-09 — End: 2022-11-15

## 2022-02-21 ENCOUNTER — Other Ambulatory Visit: Payer: Self-pay | Admitting: Family Medicine

## 2022-03-19 ENCOUNTER — Other Ambulatory Visit: Payer: Self-pay | Admitting: Family Medicine

## 2022-04-13 ENCOUNTER — Emergency Department (HOSPITAL_COMMUNITY)
Admission: EM | Admit: 2022-04-13 | Discharge: 2022-04-13 | Disposition: A | Discharge status: Home / Self Care | Payer: Medicare PPO | Attending: Emergency Medicine | Admitting: Emergency Medicine

## 2022-04-13 ENCOUNTER — Encounter (HOSPITAL_COMMUNITY): Payer: Self-pay | Admitting: Emergency Medicine

## 2022-04-13 ENCOUNTER — Other Ambulatory Visit: Payer: Self-pay

## 2022-04-13 DIAGNOSIS — I959 Hypotension, unspecified: Secondary | ICD-10-CM | POA: Diagnosis not present

## 2022-04-13 DIAGNOSIS — R000 Tachycardia, unspecified: Secondary | ICD-10-CM | POA: Diagnosis not present

## 2022-04-13 DIAGNOSIS — Z7982 Long term (current) use of aspirin: Secondary | ICD-10-CM | POA: Diagnosis not present

## 2022-04-13 DIAGNOSIS — I2510 Atherosclerotic heart disease of native coronary artery without angina pectoris: Secondary | ICD-10-CM | POA: Diagnosis not present

## 2022-04-13 DIAGNOSIS — I10 Essential (primary) hypertension: Secondary | ICD-10-CM | POA: Diagnosis not present

## 2022-04-13 DIAGNOSIS — Z79899 Other long term (current) drug therapy: Secondary | ICD-10-CM | POA: Insufficient documentation

## 2022-04-13 DIAGNOSIS — R0689 Other abnormalities of breathing: Secondary | ICD-10-CM | POA: Diagnosis not present

## 2022-04-13 DIAGNOSIS — R6889 Other general symptoms and signs: Secondary | ICD-10-CM | POA: Diagnosis not present

## 2022-04-13 DIAGNOSIS — R42 Dizziness and giddiness: Secondary | ICD-10-CM | POA: Diagnosis not present

## 2022-04-13 LAB — BASIC METABOLIC PANEL
Anion gap: 9 (ref 5–15)
BUN: 20 mg/dL (ref 8–23)
CO2: 27 mmol/L (ref 22–32)
Calcium: 9.4 mg/dL (ref 8.9–10.3)
Chloride: 106 mmol/L (ref 98–111)
Creatinine, Ser: 0.75 mg/dL (ref 0.44–1.00)
GFR, Estimated: >60 mL/min (ref >60)
Glucose, Bld: 105 mg/dL — ABNORMAL HIGH (ref 70–99)
Potassium: 3.1 mmol/L — ABNORMAL LOW (ref 3.5–5.1)
Sodium: 142 mmol/L (ref 135–145)

## 2022-04-13 LAB — CBC WITH DIFFERENTIAL/PLATELET
Abs Immature Granulocytes: 0.04 10*3/uL (ref 0.00–0.07)
Basophils Absolute: 0.1 10*3/uL (ref 0.0–0.1)
Basophils Relative: 1 %
Eosinophils Absolute: 0.1 10*3/uL (ref 0.0–0.5)
Eosinophils Relative: 1 %
HCT: 42.1 % (ref 36.0–46.0)
Hemoglobin: 13.9 g/dL (ref 12.0–15.0)
Immature Granulocytes: 0 %
Lymphocytes Relative: 15 %
Lymphs Abs: 1.4 10*3/uL (ref 0.7–4.0)
MCH: 30.1 pg (ref 26.0–34.0)
MCHC: 33 g/dL (ref 30.0–36.0)
MCV: 91.1 fL (ref 80.0–100.0)
Monocytes Absolute: 0.7 10*3/uL (ref 0.1–1.0)
Monocytes Relative: 7 %
Neutro Abs: 7.3 10*3/uL (ref 1.7–7.7)
Neutrophils Relative %: 76 %
Platelets: 300 10*3/uL (ref 150–400)
RBC: 4.62 MIL/uL (ref 3.87–5.11)
RDW: 12.2 % (ref 11.5–15.5)
WBC: 9.6 10*3/uL (ref 4.0–10.5)
nRBC: 0 % (ref 0.0–0.2)

## 2022-04-13 MED ORDER — POTASSIUM CHLORIDE 20 MEQ PO PACK
40.0000 meq | PACK | Freq: Once | ORAL | Status: AC
  Administered 2022-04-13: 40 meq via ORAL
  Filled 2022-04-13: qty 2

## 2022-04-13 NOTE — Discharge Instructions (Signed)
We evaluated you today for your high blood pressure and lightheadedness.  Your lab testing was reassuring and your physical exam was reassuring.  We did not find any dangerous consequences of high blood pressure today.  Please follow-up closely with your primary care physician.  Please return to the emergency department if you develop any vomiting, headaches, chest pain, abdominal pain, difficulty breathing, passing out, or any other concerning symptoms

## 2022-04-13 NOTE — ED Triage Notes (Signed)
Pt BIB EMS from home, c/o anxiety attack. Pt took a warm bath this morning, stood up to get out and felt dizzy. Called out for help d/t increased anxiousness. SBP 180's after one hour and dose of metoprolol, SBP 140's spO2 99% RA EMS placed 20 in RFA

## 2022-04-13 NOTE — ED Provider Notes (Signed)
[facility] DEPT Provider Note   CSN: [id number] Arrival date & time: 04/13/22  0944     History  Chief Complaint  Patient presents with   Anxiety    [name] is a 81 y.o. female with history of hypertension, hyperlipidemia, coronary artery disease, presenting to the emergency department with lightheadedness.  Patient reports she was in the bath this morning, she stood up, felt lightheaded, and checked her blood pressure.  She reports that the blood pressure was over 200 so she was worried, called EMS.  She reports that the lightheadedness resolved.  She also denies any other symptoms such as chest pain, fevers, chills, back pain, abdominal pain, shortness of breath, nausea, vomiting, diaphoresis, leg swelling, syncope, vertiginous symptoms.  She reports that she has been compliant with all of her home medications. she reports she has had similar episodes before.  She has an appointment with her primary care doctor next Wednesday  [facility] Medications Prior to Admission medications   Medication Sig Start Date End Date Taking? Authorizing Provider  amLODipine (NORVASC) 2.5 MG tablet Take 1 tablet (2.5 mg total) by mouth at bedtime. 07/21/21   Fay Records, MD  aspirin EC 81 MG tablet Take 81 mg by mouth daily.    [provider]  Bempedoic Acid (NEXLETOL) 180 MG TABS Take 1 tablet by mouth daily. 02/09/22   Marin Olp, MD  Bimatoprost (LUMIGAN OP) Place 1 drop into both eyes at bedtime.     [provider]  chlorthalidone (HYGROTON) 25 MG tablet Take 1 tablet (25 mg total) by mouth daily. 01/10/22   Fay Records, MD  Cholecalciferol (VITAMIN D-3) 5000 UNITS TABS Take 5,000 mg by mouth daily.    [provider]  dorzolamide-timolol (COSOPT) 22.3-6.8 MG/ML ophthalmic solution Place 1 drop into both eyes 2 (two) times daily.     [provider]  Evolocumab (REPATHA SURECLICK) 947 MG/ML SOAJ INJECT  CONTENTS OF 1 PEN ('140MG'$ ) INTO SKIN EVERY 14 DAYS. 02/06/22   Fay Records, MD  famotidine (PEPCID) 20 MG tablet TAKE 1 TABLET BY MOUTH TWICE DAILY. 05/16/21   Marin Olp, MD  icosapent Ethyl (VASCEPA) 1 g capsule Take 1 capsule (1 g total) by mouth 2 (two) times daily. 02/06/22   Fay Records, MD  levothyroxine (SYNTHROID) 50 MCG tablet TAKE ONE TABLET BY MOUTH BEFORE BREAKFAST 03/19/22   Marin Olp, MD  metoprolol tartrate (LOPRESSOR) 25 MG tablet TAKE ONE TABLET BY MOUTH TWICE DAILY 02/21/22   Marin Olp, MD  nitroGLYCERIN (NITROSTAT) 0.4 MG SL tablet Place 1 tablet (0.4 mg total) under the tongue every 5 (five) minutes x 3 doses as needed for chest pain. 01/17/21   Fay Records, MD  pilocarpine (PILOCAR) 1 % ophthalmic solution Place 1 drop into both eyes 2 (two) times daily.     [provider]  Polyethyl Glycol-Propyl Glycol (SYSTANE OP) Place 1-2 drops into both eyes as needed (for dry eyes).     [provider]  potassium chloride (KLOR-CON) 10 MEQ tablet Take 2 tablets (20 mEq total) by mouth daily. 08/30/21   Marin Olp, MD  Probiotic Product (PROBIOTIC DAILY) CAPS Take 1 capsule by mouth daily. Reported on 10/10/2015    [provider]      Allergies    Adhesive [tape] and Rosuvastatin    Review of Systems   Review of Systems  All  other systems reviewed and are negative.   Physical Exam Updated Vital Signs BP (!) 156/68   Pulse 71   Temp 97.7 F (36.5 C) (Oral)   Resp 15   Ht '5\' 3"'$  (1.6 m)   Wt 69.4 kg   SpO2 100%   BMI 27.10 kg/m  Physical Exam Constitutional:      General: She is not in acute distress.    Appearance: She is well-developed.  HENT:     Head: Normocephalic and atraumatic.     Mouth/Throat:     Mouth: Mucous membranes are moist.  Eyes:     Pupils: Pupils are equal, round, and reactive to light.  Cardiovascular:     Rate and Rhythm: Normal rate and regular rhythm.     Heart sounds: No murmur  heard. Pulmonary:     Effort: Pulmonary effort is normal. No respiratory distress.     Breath sounds: Normal breath sounds.  Abdominal:     General: Abdomen is flat.     Palpations: Abdomen is soft.     Tenderness: There is no abdominal tenderness.  Musculoskeletal:        General: No tenderness.     Right lower leg: No edema.     Left lower leg: No edema.  Skin:    General: Skin is warm and dry.  Neurological:     General: No focal deficit present.     Mental Status: She is alert and oriented to person, place, and time. Mental status is at baseline.     Cranial Nerves: No cranial nerve deficit.  Psychiatric:        Mood and Affect: Mood normal.        Behavior: Behavior normal.     ED Results / Procedures / Treatments   Labs (all labs ordered are listed, but only abnormal results are displayed) Labs Reviewed  BASIC METABOLIC PANEL - Abnormal; Notable for the following components:      Result Value   Potassium 3.1 (*)    Glucose, Bld 105 (*)    All other components within normal limits  CBC WITH DIFFERENTIAL/PLATELET    EKG EKG Interpretation  Date/Time:  Friday April 13 2022 09:59:10 EDT Ventricular Rate:  70 PR Interval:  176 QRS Duration: 100 QT Interval:  397 QTC Calculation: 429 R Axis:   39 Text Interpretation: Sinus rhythm Confirmed by Garnette Gunner [phone]) on 04/13/2022 10:12:48 AM  Radiology No results found.  Procedures .1-3 Lead EKG Interpretation  Performed by: Cristie Hem, MD Authorized by: Cristie Hem, MD     Interpretation: normal     ECG rate assessment: normal     Rhythm: sinus rhythm     Ectopy: none     Conduction: normal      Medications Ordered in ED Medications  potassium chloride (KLOR-CON) packet 40 mEq (40 mEq Oral Given 04/13/22 1126)    ED Course/ Medical Decision Making/ A&P Clinical Course as of 04/13/22 1242  Fri Apr 13, 2022  1057 Labs reassuring. Negative for AKI.  Patient has no ongoing symptoms,  remains asymptomatic.  Blood pressure stable.  Given asymptomatic hypertension, will discharge, patient has close follow-up with primary care physician next week.  Discussed return precautions. Will discharge patient to home. All questions answered. Patient comfortable with plan of discharge. Return precautions discussed with patient and specified on the after visit summary.  [WS]    Clinical Course User Index [WS] Cristie Hem, MD  Medical Decision Making Amount and/or Complexity of Data Reviewed Labs: ordered.  Risk Prescription drug management.  81 year old female presenting to the emergency department with high blood pressure, lightheadedness.  Symptoms now resolved.  Patient overall very well-appearing with reassuring exam, normal neurologic exam.  Vital signs reassuring significant only for mild hypertension.  EKG reviewed, no acute ST or T wave changes concerning for ischemia.  No sign of heart block.  Although patient had hypertension, she did not have a concerning symptoms such as headache, chest pain, syncope, vomiting, respiratory distress.  She received metoprolol and her blood pressure is improved.  Will check basic labs given age, but no indication for further testing at this time given patient is asymptomatic.  Lightheadedness may be related to standing up out of the bathtub rather than hypertension.     Final Clinical Impression(s) / ED Diagnoses Final diagnoses:  Hypertension, unspecified type    Rx / DC Orders ED Discharge Orders     None         Cristie Hem, MD 04/13/22 1242

## 2022-04-18 ENCOUNTER — Encounter: Payer: Self-pay | Admitting: Family Medicine

## 2022-04-18 ENCOUNTER — Ambulatory Visit: Payer: Medicare PPO | Admitting: Family Medicine

## 2022-04-18 VITALS — BP 122/68 | HR 63 | Temp 97.4°F | Ht 63.0 in | Wt 152.8 lb

## 2022-04-18 DIAGNOSIS — E039 Hypothyroidism, unspecified: Secondary | ICD-10-CM

## 2022-04-18 DIAGNOSIS — I10 Essential (primary) hypertension: Secondary | ICD-10-CM

## 2022-04-18 DIAGNOSIS — I2510 Atherosclerotic heart disease of native coronary artery without angina pectoris: Secondary | ICD-10-CM | POA: Diagnosis not present

## 2022-04-18 DIAGNOSIS — E782 Mixed hyperlipidemia: Secondary | ICD-10-CM

## 2022-04-18 MED ORDER — LEVOTHYROXINE SODIUM 50 MCG PO TABS: ORAL_TABLET | ORAL | 3 refills | Status: DC

## 2022-04-18 MED ORDER — AMLODIPINE BESYLATE 2.5 MG PO TABS: 2.5000 mg | ORAL_TABLET | Freq: Every day | ORAL | 3 refills | Status: DC

## 2022-04-18 NOTE — Patient Instructions (Addendum)
Flu shot- we should have these available within a month or two but please let us know if you get at outside pharmacy -consider covid shot in the fall- new release in october  Blood pressure is very well-controlled today-with that being said has a high level of variability-we opted to give her some flexibility to take a second dose of amlodipine if blood pressures above 150 but otherwise just take once a day.  Want to avoid taking every day as she also sometimes runs on the lower side  Recommended follow up: Return in about 3 months (around 07/19/2022) for followup or sooner if needed.Schedule b4 you leave.

## 2022-04-18 NOTE — Progress Notes (Signed)
Phone [phone] In person visit   Subjective:   [name] is a 80 y.o. year old very pleasant female patient who presents for/with See problem oriented charting Chief Complaint  Patient presents with   Follow-up   Hypertension    Recently went to ER for this.   Hyperlipidemia   Past Medical History-  Patient Active Problem List   Diagnosis Date Noted   CAD (coronary artery disease) s/p stent mCx 05/2019 06/02/2019    Priority: High   Grieving 02/18/2017    Priority: High   Osteoporosis 11/05/2016    Priority: High   Aortic atherosclerosis ([place]) 09/15/2020    Priority: Medium    Chest pain 12/29/2018    Priority: Medium    Primary open-angle glaucoma 11/20/2017    Priority: Medium    Hypothyroidism 07/01/2013    Priority: Medium    Hypertension 11/02/2012    Priority: Medium    Hyperlipidemia 07/24/2011    Priority: Medium    hx: breast cancer, IDC (Microinvasive) w DCIS, 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 - 07/10/2011    Priority: Medium    History of adenomatous polyp of colon 09/30/2018    Priority: Low   Medicare annual wellness visit, subsequent 09/26/2014    Priority: Low   Vitamin D deficiency 11/02/2012    Priority: Low   Obesity (BMI 30-39.9) 07/24/2011    Priority: Low   PAD (peripheral artery disease) ([place]) 09/15/2020   Statin myopathy 07/01/2019   Hypokalemia 06/02/2019   NSTEMI (non-ST elevated myocardial infarction) ([place]) 05/31/2019   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     Bempedoic Acid (NEXLETOL) 180 MG TABS Take 1 tablet by mouth daily. 90  tablet 2   Bimatoprost (LUMIGAN OP) Place 1 drop into both eyes at bedtime.      chlorthalidone (HYGROTON) 25 MG tablet Take 1 tablet (25 mg total) by mouth daily. 90 tablet 1   Cholecalciferol (VITAMIN D-3) 5000 UNITS TABS Take 5,000 mg by mouth daily.     dorzolamide-timolol (COSOPT) 22.3-6.8 MG/ML ophthalmic solution Place 1 drop into both eyes 2 (two) times daily.      Evolocumab (REPATHA SURECLICK) 751 MG/ML SOAJ INJECT CONTENTS OF 1 PEN ('140MG'$ ) INTO SKIN EVERY 14 DAYS. 2 mL 11   famotidine (PEPCID) 20 MG tablet TAKE 1 TABLET BY MOUTH TWICE DAILY. 180 tablet 3   icosapent Ethyl (VASCEPA) 1 g capsule Take 1 capsule (1 g total) by mouth 2 (two) times daily. 180 capsule 3   metoprolol tartrate (LOPRESSOR) 25 MG tablet TAKE ONE TABLET BY MOUTH TWICE DAILY 180 tablet 0   nitroGLYCERIN (NITROSTAT) 0.4 MG SL tablet Place 1 tablet (0.4 mg total) under the tongue every 5 (five) minutes x 3 doses as needed for chest pain. 25 tablet 3   pilocarpine (PILOCAR) 1 % ophthalmic solution Place 1 drop into both eyes 2 (two) times daily.      Polyethyl Glycol-Propyl Glycol (SYSTANE OP) Place 1-2 drops into both eyes as needed (for dry eyes).      potassium chloride (KLOR-CON) 10 MEQ tablet Take 2 tablets (20 mEq total)  by mouth daily. 180 tablet 3   Probiotic Product (PROBIOTIC DAILY) CAPS Take 1 capsule by mouth daily. Reported on 10/10/2015     amLODipine (NORVASC) 2.5 MG tablet Take 1-2 tablets (2.5-5 mg total) by mouth at bedtime. Take once daily unless blood pressure elevated above 150 then take twice daily 180 tablet 3   levothyroxine (SYNTHROID) 50 MCG tablet TAKE ONE TABLET BY MOUTH BEFORE BREAKFAST 90 tablet 3   No current facility-administered medications for this visit.     Objective:  BP 122/68   Pulse 63   Temp (!) 97.4 F (36.3 C)   Ht '5\' 3"'$  (1.6 m)   Wt 152 lb 12.8 oz (69.3 kg)   SpO2 100%   BMI 27.07 kg/m  Gen: NAD, resting comfortably CV: RRR no murmurs rubs or gallops Lungs: CTAB  no crackles, wheeze, rhonchi Abdomen: soft/nontender/nondistended Ext: minimal edema Skin: warm, dry     Assessment and Plan   # ED follow-up-patient felt lightheaded in the shower and check blood pressure was over 200 on 04/13/2022 and presented to the emergency room-thankfully blood pressure trended down into the 150s.  Labs were reassuring other than low potassium and no AKI was noted.  She was asymptomatic in the emergency department and recommended have close follow-up with primary care.  EKG was stable/reassuring - has not had anymore spikes in this range- max up to low 160s  #hypertension S: medication: Metoprolol 25 mg twice daily, amlodipine 2.5 mg, chlorthalidone 25 mg with potassium daily Home readings #s: vary from 99 to 160s over 60s to 80s. Most recent 139/72  A/P: Blood pressure is very well-controlled today-with that being said has a high level of variability-we opted to give her some flexibility to take a second dose of amlodipine if blood pressures above 150 but otherwise just take once a day.  Want to avoid taking every day as she also sometimes runs on the lower side -otherwise continue current meds  #CAD-follows with Dr. Harrington Challenger on aspirin #hyperlipidemia S: Medication: Repatha 140 mg every 2 weeks and Vascepa 1 g, bempedoic acid -thankfully no chest pain or shortness of breath with episode or recenlty. Does take aspirin 81 mg Lab Results  Component Value Date   CHOL 174 04/20/2021   HDL 70.90 04/20/2021   LDLCALC 83 04/20/2021   LDLDIRECT 54.0 06/01/2021   TRIG 101.0 04/20/2021   CHOLHDL 2 04/20/2021   A/P: CAD asymptomatic- continue current meds. Ideal control last September- wil be due for lipid panel next visit  #hypothyroidism S: compliant On thyroid medication-levothyroxine 50 mcg Lab Results  Component Value Date   TSH 1.83 12/14/2021   A/P:Controlled. Continue current medications.    Recommended follow up: Return in about 3 months (around 07/19/2022) for  followup or sooner if needed.Schedule b4 you leave.  Lab/Order associations:   ICD-10-CM   1. Coronary artery disease involving native coronary artery of native heart without angina pectoris  I25.10     2. Primary hypertension  I10     3. Hypothyroidism, unspecified type  E03.9     4. Mixed hyperlipidemia  E78.2      Meds ordered this encounter  Medications   amLODipine (NORVASC) 2.5 MG tablet    Sig: Take 1-2 tablets (2.5-5 mg total) by mouth at bedtime. Take once daily unless blood pressure elevated above 150 then take twice daily    Dispense:  180 tablet    Refill:  3   levothyroxine (SYNTHROID) 50 MCG tablet  Sig: TAKE ONE TABLET BY MOUTH BEFORE BREAKFAST    Dispense:  90 tablet    Refill:  3    Return precautions advised.  Garret Reddish, MD

## 2022-04-25 ENCOUNTER — Other Ambulatory Visit: Payer: Self-pay | Admitting: Family Medicine

## 2022-04-25 DIAGNOSIS — Z1231 Encounter for screening mammogram for malignant neoplasm of breast: Secondary | ICD-10-CM

## 2022-04-30 DIAGNOSIS — H401422 Capsular glaucoma with pseudoexfoliation of lens, left eye, moderate stage: Secondary | ICD-10-CM | POA: Diagnosis not present

## 2022-04-30 DIAGNOSIS — Z961 Presence of intraocular lens: Secondary | ICD-10-CM | POA: Diagnosis not present

## 2022-04-30 DIAGNOSIS — H401413 Capsular glaucoma with pseudoexfoliation of lens, right eye, severe stage: Secondary | ICD-10-CM | POA: Diagnosis not present

## 2022-05-16 ENCOUNTER — Other Ambulatory Visit: Payer: Self-pay | Admitting: Family Medicine

## 2022-05-21 ENCOUNTER — Ambulatory Visit
Admission: RE | Admit: 2022-05-21 | Discharge: 2022-05-21 | Disposition: A | Discharge status: Home / Self Care | Payer: Medicare PPO | Source: Ambulatory Visit | Attending: Family Medicine | Admitting: Family Medicine

## 2022-05-21 DIAGNOSIS — Z1231 Encounter for screening mammogram for malignant neoplasm of breast: Secondary | ICD-10-CM

## 2022-05-25 ENCOUNTER — Other Ambulatory Visit: Payer: Self-pay | Admitting: Family Medicine

## 2022-05-31 ENCOUNTER — Encounter: Payer: Self-pay | Admitting: Physician Assistant

## 2022-05-31 ENCOUNTER — Ambulatory Visit: Payer: Medicare PPO | Admitting: Physician Assistant

## 2022-05-31 VITALS — BP 140/60 | HR 75 | Temp 97.8°F | Ht 63.0 in | Wt 152.4 lb

## 2022-05-31 DIAGNOSIS — R300 Dysuria: Secondary | ICD-10-CM

## 2022-05-31 DIAGNOSIS — Z23 Encounter for immunization: Secondary | ICD-10-CM

## 2022-05-31 LAB — POCT URINALYSIS DIPSTICK
Bilirubin, UA: NEGATIVE
Blood, UA: NEGATIVE
Glucose, UA: NEGATIVE
Ketones, UA: NEGATIVE
Leukocytes, UA: NEGATIVE
Nitrite, UA: NEGATIVE
Protein, UA: NEGATIVE
Spec Grav, UA: 1.015 (ref 1.010–1.025)
Urobilinogen, UA: 0.2 E.U./dL
pH, UA: 5 (ref 5.0–8.0)

## 2022-05-31 MED ORDER — CEPHALEXIN 500 MG PO CAPS: 500.0000 mg | ORAL_CAPSULE | Freq: Two times a day (BID) | ORAL | 0 refills | Status: DC

## 2022-05-31 NOTE — Progress Notes (Signed)
[name] is a 81 y.o. female here for a new problem.  History of Present Illness:   Chief Complaint  Patient presents with   Urinary Frequency    Pt c/o urinary frequency x 5-6 days, denies dysuria,  back pain, no fever or chills.    HPI  UTI- She complains of burning while fatigue, urinating and frequency for the past week. She is not keeping up with her water intake. She also notes being under more stress due to taking care of her sister. She denies any fevers, chills, nausea at this time. She has a history of UTI's in the past and reports her current symptoms are similar to her UTI's in the past. She had diarrhea recently. She has started drinking cranberry juice.   Past Medical History:  Diagnosis Date   Abnormal thyroid function test 07/01/2013   Anxiety    Arthritis    Cancer ([place])    Coronary artery disease    Dermatitis 11/25/2013   Glaucoma 11/02/2012   HTN (hypertension) 11/02/2012   hx: breast cancer, IDC (Microinvasive) w DCIS, 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 - 07/10/2011   Hyperlipidemia    Hypothyroid    Obesity      Social History   Tobacco Use   Smoking status: Never    Passive exposure: Never   Smokeless tobacco: Never  Vaping Use   Vaping Use: Never used  Substance Use Topics   Alcohol use: Yes    Comment: Rare   Drug use: No    Past Surgical History:  Procedure Laterality Date   AUGMENTATION MAMMAPLASTY Left    05/2006   BREAST SURGERY  10/2005   Left Mastectomy, reconstruction, reduction on right. Dr. Harlow Mares   CARDIAC CATHETERIZATION     COLONOSCOPY     CORONARY STENT INTERVENTION N/A 06/01/2019   Procedure: CORONARY STENT INTERVENTION;  Surgeon: Sherren Mocha, MD;  Location: [facility];  Service: Cardiovascular;  Laterality: N/A;   CRYOTHERAPY     GYN for CIN III   DILATION AND CURETTAGE OF UTERUS     EXCISION MORTON'S NEUROMA     right foot   EYE SURGERY Right 05/08/2018   LEFT HEART CATH AND CORONARY ANGIOGRAPHY N/A 06/01/2019    Procedure: LEFT HEART CATH AND CORONARY ANGIOGRAPHY;  Surgeon: Sherren Mocha, MD;  Location: [facility];  Service: Cardiovascular;  Laterality: N/A;   MASTECTOMY     REDUCTION MAMMAPLASTY Right     Family History  Problem Relation Age of Onset   Pneumonia Mother    Hyperlipidemia Mother    Parkinsonism Mother    CAD Mother    Other Father        bilateral subdural hematoma   Colon cancer Paternal Grandmother    Hyperlipidemia Sister    Prostate cancer Neg Hx    Breast cancer Neg Hx    Heart disease Neg Hx    Diabetes Neg Hx     Allergies  Allergen Reactions   Adhesive [Tape] Other (See Comments)    Causes skin to turn red where touched   Rosuvastatin Other (See Comments)    Joint & muscle aches, sleep issues.    Current Medications:   Current Outpatient Medications:    amLODipine (NORVASC) 2.5 MG tablet, Take 1-2 tablets (2.5-5 mg total) by mouth at bedtime. Take once daily unless blood pressure elevated above 150 then take twice daily, Disp: 180 tablet, Rfl: 3   aspirin EC 81 MG tablet, Take 81 mg  by mouth daily., Disp: , Rfl:    Bempedoic Acid (NEXLETOL) 180 MG TABS, Take 1 tablet by mouth daily., Disp: 90 tablet, Rfl: 2   Bimatoprost (LUMIGAN OP), Place 1 drop into both eyes at bedtime. , Disp: , Rfl:    cephALEXin (KEFLEX) 500 MG capsule, Take 1 capsule (500 mg total) by mouth 2 (two) times daily., Disp: 14 capsule, Rfl: 0   chlorthalidone (HYGROTON) 25 MG tablet, Take 1 tablet (25 mg total) by mouth daily., Disp: 90 tablet, Rfl: 1   Cholecalciferol (VITAMIN D-3) 5000 UNITS TABS, Take 5,000 mg by mouth daily., Disp: , Rfl:    dorzolamide-timolol (COSOPT) 22.3-6.8 MG/ML ophthalmic solution, Place 1 drop into both eyes 2 (two) times daily. , Disp: , Rfl:    Evolocumab (REPATHA SURECLICK) 494 MG/ML SOAJ, INJECT CONTENTS OF 1 PEN ('140MG'$ ) INTO SKIN EVERY 14 DAYS., Disp: 2 mL, Rfl: 11   famotidine (PEPCID) 20 MG tablet, TAKE 1 TABLET BY MOUTH TWICE DAILY., Disp: 180  tablet, Rfl: 3   icosapent Ethyl (VASCEPA) 1 g capsule, Take 1 capsule (1 g total) by mouth 2 (two) times daily., Disp: 180 capsule, Rfl: 3   levothyroxine (SYNTHROID) 50 MCG tablet, TAKE ONE TABLET BY MOUTH BEFORE BREAKFAST, Disp: 90 tablet, Rfl: 3   metoprolol tartrate (LOPRESSOR) 25 MG tablet, TAKE ONE TABLET BY MOUTH TWICE DAILY, Disp: 180 tablet, Rfl: 0   nitroGLYCERIN (NITROSTAT) 0.4 MG SL tablet, Place 1 tablet (0.4 mg total) under the tongue every 5 (five) minutes x 3 doses as needed for chest pain., Disp: 25 tablet, Rfl: 3   pilocarpine (PILOCAR) 1 % ophthalmic solution, Place 1 drop into both eyes 2 (two) times daily. , Disp: , Rfl:    Polyethyl Glycol-Propyl Glycol (SYSTANE OP), Place 1-2 drops into both eyes as needed (for dry eyes). , Disp: , Rfl:    potassium chloride (KLOR-CON) 10 MEQ tablet, Take 2 tablets (20 mEq total) by mouth daily., Disp: 180 tablet, Rfl: 3   Probiotic Product (PROBIOTIC DAILY) CAPS, Take 1 capsule by mouth daily. Reported on 10/10/2015, Disp: , Rfl:    Review of Systems:   Review of Systems  Constitutional:  Positive for malaise/fatigue.  Gastrointestinal:  Positive for diarrhea.  Genitourinary:  Positive for dysuria and frequency.    Vitals:   Vitals:   05/31/22 0934  BP: (!) 140/60  Pulse: 75  Temp: 97.8 F (36.6 C)  TempSrc: Temporal  SpO2: 97%  Weight: 152 lb 6.1 oz (69.1 kg)  Height: '5\' 3"'$  (1.6 m)     Body mass index is 26.99 kg/m.  Physical Exam:   Physical Exam Constitutional:      Appearance: Normal appearance.  HENT:     Head: Normocephalic and atraumatic.     Right Ear: External ear normal.     Left Ear: External ear normal.  Eyes:     Extraocular Movements: Extraocular movements intact.     Pupils: Pupils are equal, round, and reactive to light.  Cardiovascular:     Rate and Rhythm: Normal rate and regular rhythm.     Heart sounds: Normal heart sounds. No murmur heard. Pulmonary:     Effort: Pulmonary effort is normal.  No respiratory distress.     Breath sounds: Normal breath sounds. No wheezing or rales.  Abdominal:     Tenderness: There is no right CVA tenderness or left CVA tenderness.  Skin:    General: Skin is warm and dry.  Neurological:     Mental  Status: She is alert and oriented to person, place, and time.  Psychiatric:        Judgment: Judgment normal.    Results for orders placed or performed in visit on 05/31/22  POCT urinalysis dipstick  Result Value Ref Range   Color, UA yellow    Clarity, UA clear    Glucose, UA Negative Negative   Bilirubin, UA Negative    Ketones, UA Negative    Spec Grav, UA 1.015 1.010 - 1.025   Blood, UA Negative    pH, UA 5.0 5.0 - 8.0   Protein, UA Negative Negative   Urobilinogen, UA 0.2 0.2 or 1.0 E.U./dL   Nitrite, UA Negative    Leukocytes, UA Negative Negative   Appearance     Odor       Assessment and Plan:   Dysuria UA is unrevealing however her symptoms are concerning for acute cystitis Will treat with 500 mg Keflex twice daily We will also send urine off for culture Discussed hygiene and ways to prevent UTIs in the future Worsening precautions advised   I,Shehryar Baig,acting as a scribe for [employer], PA.,have documented all relevant documentation on the behalf of Inda Coke, PA,as directed by  Inda Coke, PA while in the presence of Inda Coke, [place].  Inda Coke, PA-C

## 2022-05-31 NOTE — Patient Instructions (Signed)
It was great to see you!  Start keflex antibiotic We will be in touch with your urine culture results  General instructions Make sure you: Pee until your bladder is empty. Do not hold pee for a long time. Empty your bladder after sex. Wipe from front to back after pooping if you are a female. Use each tissue one time when you wipe. Drink enough fluid to keep your pee pale yellow. Keep all follow-up visits as told by your doctor. This is important. Contact a doctor if: You do not get better after 1-2 days. Your symptoms go away and then come back. Get help right away if: You have very bad back pain. You have very bad pain in your lower belly. You have a fever. You are sick to your stomach (nauseous). You are throwing up.

## 2022-06-01 ENCOUNTER — Telehealth: Payer: Self-pay | Admitting: Family Medicine

## 2022-06-01 LAB — URINE CULTURE
MICRO NUMBER:: 13950123
SPECIMEN QUALITY:: ADEQUATE

## 2022-06-01 NOTE — Telephone Encounter (Signed)
Copied from [place]. Topic: Medicare AWV >> Jun 01, 2022  9:56 AM Devoria Glassing wrote: Reason for CRM: Left message for patient to schedule Annual Wellness Visit.  Please schedule with Nurse Health Advisor Charlott Rakes, RN at [facility] This appt can be telephone or office visit. Please call [phone] ask for [facility]

## 2022-06-05 ENCOUNTER — Encounter: Payer: Self-pay | Admitting: Family Medicine

## 2022-06-05 ENCOUNTER — Ambulatory Visit: Payer: Medicare PPO | Admitting: Family Medicine

## 2022-06-05 VITALS — BP 138/66 | HR 65 | Temp 98.1°F | Ht 63.0 in | Wt 150.0 lb

## 2022-06-05 DIAGNOSIS — R351 Nocturia: Secondary | ICD-10-CM | POA: Diagnosis not present

## 2022-06-05 DIAGNOSIS — I10 Essential (primary) hypertension: Secondary | ICD-10-CM

## 2022-06-05 DIAGNOSIS — Z23 Encounter for immunization: Secondary | ICD-10-CM

## 2022-06-05 NOTE — Patient Instructions (Addendum)
No true UTI was found. Hold off on urology unless symptoms worsen.   Lets try moving meds/fluids up to maybe an hour before bed and cut down on fluids for 2 hours before bed  Recommended follow up: Return for next already scheduled visit or sooner if needed. -look forward to seeing you in november

## 2022-06-05 NOTE — Progress Notes (Signed)
Phone [phone] In person visit   Subjective:   [name] is a 81 y.o. year old very pleasant female patient who presents for/with See problem oriented charting Chief Complaint  Patient presents with   discuss urology referral    Pt c/o still having urinary frequency.    Past Medical History-  Patient Active Problem List   Diagnosis Date Noted   CAD (coronary artery disease) s/p stent mCx 05/2019 06/02/2019    Priority: High   Grieving 02/18/2017    Priority: High   Osteoporosis 11/05/2016    Priority: High   Aortic atherosclerosis ([place]) 09/15/2020    Priority: Medium    Chest pain 12/29/2018    Priority: Medium    Primary open-angle glaucoma 11/20/2017    Priority: Medium    Hypothyroidism 07/01/2013    Priority: Medium    Hypertension 11/02/2012    Priority: Medium    Hyperlipidemia 07/24/2011    Priority: Medium    hx: breast cancer, IDC (Microinvasive) w DCIS, 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 - 07/10/2011    Priority: Medium    History of adenomatous polyp of colon 09/30/2018    Priority: Low   Medicare annual wellness visit, subsequent 09/26/2014    Priority: Low   Vitamin D deficiency 11/02/2012    Priority: Low   Obesity (BMI 30-39.9) 07/24/2011    Priority: Low   PAD (peripheral artery disease) ([place]) 09/15/2020   Statin myopathy 07/01/2019   Hypokalemia 06/02/2019   NSTEMI (non-ST elevated myocardial infarction) ([place]) 05/31/2019   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 Take once daily unless blood pressure elevated  above 150 then take twice daily 180 tablet 3   aspirin EC 81 MG tablet Take 81 mg by mouth daily.     Bempedoic Acid (NEXLETOL) 180 MG TABS Take 1 tablet by mouth daily. 90 tablet 2   Bimatoprost (LUMIGAN OP) Place 1 drop into both eyes at bedtime.      cephALEXin (KEFLEX) 500 MG capsule Take 1 capsule (500 mg total) by mouth 2 (two) times daily. 14 capsule 0   chlorthalidone (HYGROTON) 25 MG tablet Take 1 tablet (25 mg total) by mouth daily. 90 tablet 1   Cholecalciferol (VITAMIN D-3) 5000 UNITS TABS Take 5,000 mg by mouth daily.     dorzolamide-timolol (COSOPT) 22.3-6.8 MG/ML ophthalmic solution Place 1 drop into both eyes 2 (two) times daily.      Evolocumab (REPATHA SURECLICK) 322 MG/ML SOAJ INJECT CONTENTS OF 1 PEN ('140MG'$ ) INTO SKIN EVERY 14 DAYS. 2 mL 11   famotidine (PEPCID) 20 MG tablet TAKE 1 TABLET BY MOUTH TWICE DAILY. 180 tablet 3   icosapent Ethyl (VASCEPA) 1 g capsule Take 1 capsule (1 g total) by mouth 2 (two) times daily. 180 capsule 3   levothyroxine (SYNTHROID) 50 MCG tablet TAKE ONE TABLET BY MOUTH BEFORE BREAKFAST 90 tablet 3   metoprolol tartrate (LOPRESSOR) 25 MG tablet TAKE ONE TABLET BY MOUTH TWICE DAILY 180 tablet 0   nitroGLYCERIN (NITROSTAT) 0.4 MG SL tablet Place 1 tablet (0.4 mg total) under the tongue every 5 (  five) minutes x 3 doses as needed for chest pain. 25 tablet 3   pilocarpine (PILOCAR) 1 % ophthalmic solution Place 1 drop into both eyes 2 (two) times daily.      Polyethyl Glycol-Propyl Glycol (SYSTANE OP) Place 1-2 drops into both eyes as needed (for dry eyes).      potassium chloride (KLOR-CON) 10 MEQ tablet Take 2 tablets (20 mEq total) by mouth daily. 180 tablet 3   Probiotic Product (PROBIOTIC DAILY) CAPS Take 1 capsule by mouth daily. Reported on 10/10/2015     No current facility-administered medications for this visit.     Objective:  BP 138/66   Pulse 65   Temp 98.1 F (36.7 C)   Ht '5\' 3"'$  (1.6 m)   Wt 150 lb (68 kg)   SpO2 97%   BMI 26.57  kg/m  Gen: NAD, resting comfortably CV: RRR no murmurs rubs or gallops Lungs: CTAB no crackles, wheeze, rhonchi Abdomen: soft/nontender/nondistended.  Ext: trace edema Skin: warm, dry     Assessment and Plan   #social update- looking at [facility] [place] soon. Has application in for [facility].   # Urinary frequency and urgency S:had seen urology in the past for urinary frequency/UTIs Patients symptoms started a week ago. She was seen on 05/31/22 and was started on keflex '500mg'$  twice daily with urine sent for culture which did not show true UTI with <10k colonies. She has 2 more days -nocturia 3-4 x a night (some frequency in day but night is worse) but trying to stay well hydrated.  - no dysuria.  ROS- no fever, chills, nausea, vomiting, flank pain. No blood in urine.  A/P: I am not sure what started patient's symptoms of increased frequency and urgency (possible overactive bladder but interesting that started more acutely).  With that being said-urinalysis was unremarkable and urine culture showed no true infection.  She has tried to compensate for symptoms by increasing water which is causing more frequency and urgency-encouraged her to cut back on her fluids in particular before bed (which is most bothersome time) -No glucose in her urine and no history of diabetes or prediabetes-had a blood sugar recently nonfasting under 110-do not think we need to work-up for diabetes at this time -She knows if she has new or worsening symptoms that she should return to see [place] -Chlorthalidone likely contributes to symptoms but needed for blood pressure control -After discussion she would like to hold off on trial of medication or urology referral at this time  #hypertension S: medication: Metoprolol 25 mg twice daily, amlodipine 2.5 mg (with 2nd dose if needed for high BP over 150 given variability- has not needed after practicing deep breathing), chlorthalidone 25 mg with potassium daily Home  readings #s: Still highly variable blood pressures as low as 100 and as high as 160-thankfully deep breathing has brought blood pressure down recently even after a few minutes bmet appears most blood pressures are in the 120s or 130s on her home readings BP Readings from Last 3 Encounters:  06/05/22 138/66  05/31/22 (!) 140/60  04/18/22 122/68  A/P: Reasonably controlled. Continue current medications.    Recommended follow up: Return for next already scheduled visit or sooner if needed. Future Appointments  Date Time Provider [place]  07/19/2022  9:00 AM Marin Olp, MD [facility]  08/13/2022  8:00 AM Fay Records, MD [facility]    Lab/Order associations:   ICD-10-CM   1. Primary hypertension  I10  2. Nocturia  R35.1     3. Need for immunization against influenza  Z23 Flu Vaccine QUAD High Dose(Fluad)     Return precautions advised.  Garret Reddish, MD

## 2022-07-11 ENCOUNTER — Emergency Department (HOSPITAL_BASED_OUTPATIENT_CLINIC_OR_DEPARTMENT_OTHER)
Admission: EM | Admit: 2022-07-11 | Discharge: 2022-07-11 | Disposition: A | Discharge status: Home / Self Care | Payer: Medicare PPO | Attending: Emergency Medicine | Admitting: Emergency Medicine

## 2022-07-11 ENCOUNTER — Encounter (HOSPITAL_BASED_OUTPATIENT_CLINIC_OR_DEPARTMENT_OTHER): Payer: Self-pay | Admitting: *Deleted

## 2022-07-11 ENCOUNTER — Other Ambulatory Visit: Payer: Self-pay

## 2022-07-11 DIAGNOSIS — R5383 Other fatigue: Secondary | ICD-10-CM | POA: Diagnosis not present

## 2022-07-11 DIAGNOSIS — I10 Essential (primary) hypertension: Secondary | ICD-10-CM | POA: Diagnosis not present

## 2022-07-11 DIAGNOSIS — Z79899 Other long term (current) drug therapy: Secondary | ICD-10-CM | POA: Diagnosis not present

## 2022-07-11 DIAGNOSIS — I2510 Atherosclerotic heart disease of native coronary artery without angina pectoris: Secondary | ICD-10-CM | POA: Diagnosis not present

## 2022-07-11 DIAGNOSIS — Z7982 Long term (current) use of aspirin: Secondary | ICD-10-CM | POA: Insufficient documentation

## 2022-07-11 DIAGNOSIS — E039 Hypothyroidism, unspecified: Secondary | ICD-10-CM | POA: Diagnosis not present

## 2022-07-11 LAB — URINALYSIS, ROUTINE W REFLEX MICROSCOPIC
Bilirubin Urine: NEGATIVE
Glucose, UA: NEGATIVE mg/dL
Ketones, ur: NEGATIVE mg/dL
Nitrite: NEGATIVE
Protein, ur: NEGATIVE mg/dL
Specific Gravity, Urine: 1.011 (ref 1.005–1.030)
pH: 5 (ref 5.0–8.0)

## 2022-07-11 LAB — BASIC METABOLIC PANEL
Anion gap: 11 (ref 5–15)
BUN: 22 mg/dL (ref 8–23)
CO2: 31 mmol/L (ref 22–32)
Calcium: 10.1 mg/dL (ref 8.9–10.3)
Chloride: 101 mmol/L (ref 98–111)
Creatinine, Ser: 0.93 mg/dL (ref 0.44–1.00)
GFR, Estimated: >60 mL/min (ref >60)
Glucose, Bld: 73 mg/dL (ref 70–99)
Potassium: 3.2 mmol/L — ABNORMAL LOW (ref 3.5–5.1)
Sodium: 143 mmol/L (ref 135–145)

## 2022-07-11 LAB — CBC
HCT: 43.9 % (ref 36.0–46.0)
Hemoglobin: 14.4 g/dL (ref 12.0–15.0)
MCH: 30.2 pg (ref 26.0–34.0)
MCHC: 32.8 g/dL (ref 30.0–36.0)
MCV: 92 fL (ref 80.0–100.0)
Platelets: 358 10*3/uL (ref 150–400)
RBC: 4.77 MIL/uL (ref 3.87–5.11)
RDW: 12.1 % (ref 11.5–15.5)
WBC: 8.7 10*3/uL (ref 4.0–10.5)
nRBC: 0 % (ref 0.0–0.2)

## 2022-07-11 LAB — TSH: TSH: 1.608 u[IU]/mL (ref 0.350–4.500)

## 2022-07-11 LAB — T4, FREE: Free T4: 1.36 ng/dL — ABNORMAL HIGH (ref 0.61–1.12)

## 2022-07-11 NOTE — ED Provider Notes (Signed)
[facility] EMERGENCY DEPT Provider Note   CSN: [id number] Arrival date & time: 07/11/22  0944     History  Chief Complaint  Patient presents with   Fatigue    [name] is a 81 y.o. female.  Patient is an 81 year old pleasant female with history of hypertension, hypothyroidism, CAD, hyperlipidemia who is presenting today with complaints of over the last 3 to 4 weeks having extreme fatigue.  She reports she normally seems to have quite a bit of energy but she just feels tired all the time.  She is waking up 3 or 4 times at night to use the bathroom as she is having urinary frequency but denies any dysuria.  She is not having any focal areas of pain.  She denies any change in her appetite and denies any vomiting or diarrhea.  She has not had fever.  Today she checked her blood pressure and it was elevated and she kept checking it and the blood pressure kept going up which made her feel very concerned.  She also reports feeling sad after her husband's death 5 years ago she just has never really gotten over it.  She is planning on following up with her internist next week but because of the symptoms she wanted to be checked.  She recently discontinued 1 dose of her blood pressure medication per her cardiologist but otherwise has had no other medication changes.  She denies any chest pain or shortness of breath.  She has not had any fevers that she is aware of.  The history is provided by the patient.       Home Medications Prior to Admission medications   Medication Sig Start Date End Date Taking? Authorizing Provider  amLODipine (NORVASC) 2.5 MG tablet Take 1-2 tablets (2.5-5 mg total) by mouth at bedtime. Take once daily unless blood pressure elevated above 150 then take twice daily 04/18/22   Marin Olp, MD  aspirin EC 81 MG tablet Take 81 mg by mouth daily.    [provider]  Bempedoic Acid (NEXLETOL) 180 MG TABS Take 1 tablet by mouth daily. 02/09/22    Marin Olp, MD  Bimatoprost (LUMIGAN OP) Place 1 drop into both eyes at bedtime.     [provider]  cephALEXin (KEFLEX) 500 MG capsule Take 1 capsule (500 mg total) by mouth 2 (two) times daily. 05/31/22   Inda Coke, PA  chlorthalidone (HYGROTON) 25 MG tablet Take 1 tablet (25 mg total) by mouth daily. 01/10/22   Fay Records, MD  Cholecalciferol (VITAMIN D-3) 5000 UNITS TABS Take 5,000 mg by mouth daily.    [provider]  dorzolamide-timolol (COSOPT) 22.3-6.8 MG/ML ophthalmic solution Place 1 drop into both eyes 2 (two) times daily.     [provider]  Evolocumab (REPATHA SURECLICK) 330 MG/ML SOAJ INJECT CONTENTS OF 1 PEN ('140MG'$ ) INTO SKIN EVERY 14 DAYS. 02/06/22   Fay Records, MD  famotidine (PEPCID) 20 MG tablet TAKE 1 TABLET BY MOUTH TWICE DAILY. 05/25/22   Marin Olp, MD  icosapent Ethyl (VASCEPA) 1 g capsule Take 1 capsule (1 g total) by mouth 2 (two) times daily. 02/06/22   Fay Records, MD  levothyroxine (SYNTHROID) 50 MCG tablet TAKE ONE TABLET BY MOUTH BEFORE BREAKFAST 04/18/22   Marin Olp, MD  metoprolol tartrate (LOPRESSOR) 25 MG tablet TAKE ONE TABLET BY MOUTH TWICE DAILY 05/16/22   Marin Olp, MD  nitroGLYCERIN (NITROSTAT) 0.4 MG SL tablet Place  1 tablet (0.4 mg total) under the tongue every 5 (five) minutes x 3 doses as needed for chest pain. 01/17/21   Fay Records, MD  pilocarpine (PILOCAR) 1 % ophthalmic solution Place 1 drop into both eyes 2 (two) times daily.     [provider]  Polyethyl Glycol-Propyl Glycol (SYSTANE OP) Place 1-2 drops into both eyes as needed (for dry eyes).     [provider]  potassium chloride (KLOR-CON) 10 MEQ tablet Take 2 tablets (20 mEq total) by mouth daily. 08/30/21   Marin Olp, MD  Probiotic Product (PROBIOTIC DAILY) CAPS Take 1 capsule by mouth daily. Reported on 10/10/2015    [provider]      Allergies    Adhesive [tape] and Rosuvastatin     Review of Systems   Review of Systems  Physical Exam Updated Vital Signs BP (!) 135/53   Pulse 66   Temp 97.7 F (36.5 C) (Oral)   Resp 17   SpO2 98%  Physical Exam Vitals and nursing note reviewed.  Constitutional:      General: She is not in acute distress.    Appearance: She is well-developed.  HENT:     Head: Normocephalic and atraumatic.     Mouth/Throat:     Mouth: Mucous membranes are moist.  Eyes:     Pupils: Pupils are equal, round, and reactive to light.  Cardiovascular:     Rate and Rhythm: Normal rate and regular rhythm.     Heart sounds: Normal heart sounds. No murmur heard.    No friction rub.  Pulmonary:     Effort: Pulmonary effort is normal.     Breath sounds: Normal breath sounds. No wheezing or rales.  Abdominal:     General: Bowel sounds are normal. There is no distension.     Palpations: Abdomen is soft.     Tenderness: There is no abdominal tenderness. There is no guarding or rebound.  Musculoskeletal:        General: No tenderness. Normal range of motion.     Right lower leg: No edema.     Left lower leg: No edema.     Comments: No edema  Skin:    General: Skin is warm and dry.     Findings: No rash.  Neurological:     Mental Status: She is alert and oriented to person, place, and time. Mental status is at baseline.     Cranial Nerves: No cranial nerve deficit.  Psychiatric:        Behavior: Behavior normal.     ED Results / Procedures / Treatments   Labs (all labs ordered are listed, but only abnormal results are displayed) Labs Reviewed  BASIC METABOLIC PANEL - Abnormal; Notable for the following components:      Result Value   Potassium 3.2 (*)    All other components within normal limits  URINALYSIS, ROUTINE W REFLEX MICROSCOPIC - Abnormal; Notable for the following components:   Color, Urine COLORLESS (*)    Hgb urine dipstick TRACE (*)    Leukocytes,Ua SMALL (*)    Bacteria, UA RARE (*)    All other components within  normal limits  CBC  TSH  T4, FREE  CBG MONITORING, ED    EKG EKG Interpretation  Date/Time:  Wednesday July 11 2022 10:05:39 EDT Ventricular Rate:  62 PR Interval:  172 QRS Duration: 88 QT Interval:  396 QTC Calculation: 401 R Axis:   29 Text Interpretation: Normal  sinus rhythm Normal ECG When compared with ECG of 13-Apr-2022 09:59, PREVIOUS ECG IS PRESENT Confirmed by Blanchie Dessert [phone]) on 07/11/2022 11:19:42 AM  Radiology No results found.  Procedures Procedures    Medications Ordered in ED Medications - No data to display  ED Course/ Medical Decision Making/ A&P                           Medical Decision Making Amount and/or Complexity of Data Reviewed Labs: ordered. Decision-making details documented in ED Course. ECG/medicine tests: ordered and independent interpretation performed. Decision-making details documented in ED Course.   Pt with multiple medical problems and comorbidities and presenting today with a complaint that caries a high risk for morbidity and mortality.  Here today with multiple vague symptoms and feeling tired.  Concerned that patient may be depressed as she does mention multiple times being sad that her husband has passed away and he always did everything and she is concerned about living alone or something happening to her.  She has discontinued 1 dose of her blood pressure medication and blood pressure here is still reassuring.  She has not had her thyroid checked recently but has been compliant with her Synthroid.  She has no focal findings concerning for an acute abdominal process today and denies any chest pain or shortness of breath with lower suspicion for acute cardiac or lung pathology.  Patient satting 98% on room air and has a normal pulse today.  I independently interpreted patient's labs and EKG today.  EKG shows a little bit of artifact but otherwise is normal sinus rhythm without acute findings.  CBC, UA and BMP are all within  normal limits.  T4 and TSH are pending but that can be followed up by her PCP.  Patient given reassurance.  At this time did discuss that symptoms may be related to depression and anxiety.  She plans on following up with her PCP about this.  No indication for admission or further testing at this time.  Feel that patient is stable for discharge home.  12:44 PM TSH was within normal limits.  Patient is questioning whether she is on too much cholesterol medicine and she will bring that up with her PCP next week.  Also blood pressure has been borderline and she may not need to be on her blood pressure pill either.  All this was discussed with the patient.  She is comfortable with this plan.        Final Clinical Impression(s) / ED Diagnoses Final diagnoses:  Other fatigue    Rx / DC Orders ED Discharge Orders     None         Blanchie Dessert, MD 07/11/22 1244

## 2022-07-11 NOTE — ED Triage Notes (Signed)
Pt has had some generalized weakness at fatigue for several weeks and attributed this to her age.  She states that she woke up around 4:30 am with a "hot flash" and and then around 7:30 she states that she became concerned about her BP which was 167/84 and felt some "heaviness in back of head".  No cp or sob, no focal weakness.

## 2022-07-11 NOTE — Discharge Instructions (Addendum)
Make sure you tell your doctor you have been having some pain in your joints and muscles and you are concerned that it may be related to your cholesterol medicine.  Also continue to keep a check on your blood pressure as you may not need to be on the blood pressure medication.

## 2022-07-11 NOTE — ED Notes (Signed)
Attempt IV placement with blood draw unsuccessfully

## 2022-07-19 ENCOUNTER — Ambulatory Visit (INDEPENDENT_AMBULATORY_CARE_PROVIDER_SITE_OTHER): Payer: Medicare PPO

## 2022-07-19 ENCOUNTER — Ambulatory Visit: Payer: Medicare PPO | Admitting: Family Medicine

## 2022-07-19 ENCOUNTER — Encounter: Payer: Self-pay | Admitting: Family Medicine

## 2022-07-19 VITALS — BP 120/76 | HR 70 | Temp 98.4°F | Wt 151.0 lb

## 2022-07-19 VITALS — BP 120/76 | HR 70 | Temp 98.4°F | Ht 63.0 in | Wt 151.0 lb

## 2022-07-19 DIAGNOSIS — R351 Nocturia: Secondary | ICD-10-CM | POA: Diagnosis not present

## 2022-07-19 DIAGNOSIS — E782 Mixed hyperlipidemia: Secondary | ICD-10-CM

## 2022-07-19 DIAGNOSIS — I10 Essential (primary) hypertension: Secondary | ICD-10-CM | POA: Diagnosis not present

## 2022-07-19 DIAGNOSIS — E039 Hypothyroidism, unspecified: Secondary | ICD-10-CM

## 2022-07-19 DIAGNOSIS — Z0000 Encounter for general adult medical examination without abnormal findings: Secondary | ICD-10-CM

## 2022-07-19 DIAGNOSIS — I2510 Atherosclerotic heart disease of native coronary artery without angina pectoris: Secondary | ICD-10-CM

## 2022-07-19 LAB — LIPID PANEL
Cholesterol: 142 mg/dL (ref 0–200)
HDL: 64.2 mg/dL (ref >39.00)
LDL Cholesterol: 58 mg/dL (ref 0–99)
NonHDL: 77.66
Total CHOL/HDL Ratio: 2
Triglycerides: 98 mg/dL (ref 0.0–149.0)
VLDL: 19.6 mg/dL (ref 0.0–40.0)

## 2022-07-19 LAB — COMPREHENSIVE METABOLIC PANEL
ALT: 15 U/L (ref 0–35)
AST: 17 U/L (ref 0–37)
Albumin: 4.7 g/dL (ref 3.5–5.2)
Alkaline Phosphatase: 40 U/L (ref 39–117)
BUN: 19 mg/dL (ref 6–23)
CO2: 31 mEq/L (ref 19–32)
Calcium: 9.7 mg/dL (ref 8.4–10.5)
Chloride: 102 mEq/L (ref 96–112)
Creatinine, Ser: 0.94 mg/dL (ref 0.40–1.20)
GFR: 57.13 mL/min — ABNORMAL LOW (ref >60.00)
Glucose, Bld: 84 mg/dL (ref 70–99)
Potassium: 4.1 mEq/L (ref 3.5–5.1)
Sodium: 141 mEq/L (ref 135–145)
Total Bilirubin: 0.9 mg/dL (ref 0.2–1.2)
Total Protein: 6.7 g/dL (ref 6.0–8.3)

## 2022-07-19 NOTE — Progress Notes (Signed)
Phone [phone] In person visit   Subjective:   [name] is a 81 y.o. year old very pleasant female patient who presents for/with See problem oriented charting Chief Complaint  Patient presents with   Follow-up   Hypertension   Hyperlipidemia   Past Medical History-  Patient Active Problem List   Diagnosis Date Noted   CAD (coronary artery disease) s/p stent mCx 05/2019 06/02/2019    Priority: High   Grieving 02/18/2017    Priority: High   Osteoporosis 11/05/2016    Priority: High   Aortic atherosclerosis ([place]) 09/15/2020    Priority: Medium    Chest pain 12/29/2018    Priority: Medium    Primary open-angle glaucoma 11/20/2017    Priority: Medium    Hypothyroidism 07/01/2013    Priority: Medium    Hypertension 11/02/2012    Priority: Medium    Hyperlipidemia 07/24/2011    Priority: Medium    hx: breast cancer, IDC (Microinvasive) w DCIS, 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 - 07/10/2011    Priority: Medium    History of adenomatous polyp of colon 09/30/2018    Priority: Low   Medicare annual wellness visit, subsequent 09/26/2014    Priority: Low   Vitamin D deficiency 11/02/2012    Priority: Low   Obesity (BMI 30-39.9) 07/24/2011    Priority: Low   PAD (peripheral artery disease) ([place]) 09/15/2020   Statin myopathy 07/01/2019   Hypokalemia 06/02/2019   NSTEMI (non-ST elevated myocardial infarction) ([place]) 05/31/2019   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 Take once daily unless blood pressure elevated above 150 then take twice  daily 180 tablet 3   aspirin EC 81 MG tablet Take 81 mg by mouth daily.     Bempedoic Acid (NEXLETOL) 180 MG TABS Take 1 tablet by mouth daily. 90 tablet 2   Bimatoprost (LUMIGAN OP) Place 1 drop into both eyes at bedtime.      chlorthalidone (HYGROTON) 25 MG tablet Take 1 tablet (25 mg total) by mouth daily. 90 tablet 1   Cholecalciferol (VITAMIN D-3) 5000 UNITS TABS Take 5,000 mg by mouth daily.     dorzolamide-timolol (COSOPT) 22.3-6.8 MG/ML ophthalmic solution Place 1 drop into both eyes 2 (two) times daily.      Evolocumab (REPATHA SURECLICK) 466 MG/ML SOAJ INJECT CONTENTS OF 1 PEN ('140MG'$ ) INTO SKIN EVERY 14 DAYS. 2 mL 11   famotidine (PEPCID) 20 MG tablet TAKE 1 TABLET BY MOUTH TWICE DAILY. 180 tablet 3   icosapent Ethyl (VASCEPA) 1 g capsule Take 1 capsule (1 g total) by mouth 2 (two) times daily. 180 capsule 3   levothyroxine (SYNTHROID) 50 MCG tablet TAKE ONE TABLET BY MOUTH BEFORE BREAKFAST 90 tablet 3   metoprolol tartrate (LOPRESSOR) 25 MG tablet TAKE ONE TABLET BY MOUTH TWICE DAILY 180 tablet 0   nitroGLYCERIN (NITROSTAT) 0.4 MG SL tablet Place 1 tablet (0.4 mg total) under the tongue every 5 (five) minutes x 3 doses as needed for chest pain. 25 tablet 3   pilocarpine (PILOCAR) 1 % ophthalmic solution Place 1 drop into both eyes 2 (  two) times daily.      Polyethyl Glycol-Propyl Glycol (SYSTANE OP) Place 1-2 drops into both eyes as needed (for dry eyes).      potassium chloride (KLOR-CON) 10 MEQ tablet Take 2 tablets (20 mEq total) by mouth daily. 180 tablet 3   Probiotic Product (PROBIOTIC DAILY) CAPS Take 1 capsule by mouth daily. Reported on 10/10/2015     No current facility-administered medications for this visit.     Objective:  BP 120/76   Pulse 70   Temp 98.4 F (36.9 C)   Ht '5\' 3"'$  (1.6 m)   Wt 151 lb (68.5 kg)   SpO2 98%   BMI 26.75 kg/m  Gen: NAD, resting comfortably CV: RRR no murmurs rubs or gallops Lungs: CTAB no crackles, wheeze, rhonchi Abdomen:  soft/nontender/nondistended/normal bowel sounds.  Ext: trace edema Skin: warm, dry     Assessment and Plan   #ED visit for fatigue - urine with possible leukocytes and bacteria- and reports nocturia some -prolonged grieving- with hurt and pain of missing deceased husband and holidays coming has been challenging  #CAD-follows with Dr. Harrington Challenger on aspirin #hyperlipidemia S: Medication: Repatha 140 mg every 2 weeks, bempedoic acid '180mg'$  daily and Vascepa 1 g, aspirin 81 mg - no chest pain or shortness of breath Lab Results  Component Value Date   CHOL 174 04/20/2021   HDL 70.90 04/20/2021   LDLCALC 83 04/20/2021   LDLDIRECT 54.0 06/01/2021   TRIG 101.0 04/20/2021   CHOLHDL 2 04/20/2021  A/P: CAD asymptomatic - continue current meds Lipids hopefully LDL under 70- update lipid panel - considering getting back to [facility] more regularly  #hypertension S: medication: Metoprolol 25 mg twice daily, amlodipine 2.5 mg (with 2nd dose if needed for high BP over 150 given variability), chlorthalidone 25 mg with potassium daily Home readings #s: generally 110s to 130s- rarely into 140s. She did have one time where diastolic was into 69S and felt lightheaded that took her to ED BP Readings from Last 3 Encounters:  07/19/22 120/76  07/19/22 120/76  07/11/22 (!) 114/46  A/P: in general blood pressure looks pretty good and controlled today- occasional highs and occasionally slightly lower- due to variability and fact systolic 854 last visit do not feel strongly about reducing dose of medicine- recommended instead if blood pressure on lower side getting some hydration and electrolytes - such as having some gatorade  #hypothyroidism S: compliant On thyroid medication-levothyroxine 50 mcg Lab Results  Component Value Date   TSH 1.608 07/11/2022  A/P: recent ED visit with fatigue and T4 just a hair high but TSH normal- would not make changes at this time- continue current medicine   Recommended  follow up: Return in about 2 months (around 09/18/2022) for followup or sooner if needed.Schedule b4 you leave. Future Appointments  Date Time Provider [place]  08/13/2022  8:00 AM Fay Records, MD [facility]   Lab/Order associations: fasting   ICD-10-CM   1. Nocturia  R35.1 Urine Culture    2. Mixed hyperlipidemia  E78.2 Comprehensive metabolic panel    Lipid panel    3. Primary hypertension  I10     4. Coronary artery disease involving native coronary artery of native heart without angina pectoris  I25.10     5. Hypothyroidism, unspecified type  E03.9      Return precautions advised.  Garret Reddish, MD

## 2022-07-19 NOTE — Progress Notes (Addendum)
Subjective:   [name] is a 81 y.o. female who presents for Medicare Annual (Subsequent) preventive examination.  Review of Systems     Cardiac Risk Factors include: advanced age (>42mn, >>74women);hypertension;dyslipidemia     Objective:    Today's Vitals   07/19/22 0821  BP: 120/76  Pulse: 70  Temp: 98.4 F (36.9 C)  SpO2: 98%  Weight: 151 lb (68.5 kg)   Body mass index is 26.75 kg/m.     07/19/2022    8:37 AM 07/11/2022   10:04 AM 04/13/2022    9:58 AM 01/22/2022   11:09 AM 06/01/2021    9:06 AM 02/28/2021    4:01 PM 12/30/2020   11:30 AM  Advanced Directives  Does Patient Have a Medical Advance Directive? Yes No Yes No Yes No Yes  Type of AParamedicof [place]wood LakesLiving will    [place] will  [place] Rivers[place] will  Does patient want to make changes to medical advance directive?   No - Patient declined  Yes (ED - Information included in AVS)    Copy of [place] Chart? No - copy requested    No - copy requested  No - copy requested  Would patient like information on creating a medical advance directive?    No - Patient declined  No - Patient declined     Current Medications (verified) Outpatient Encounter Medications as of 07/19/2022  Medication Sig   amLODipine (NORVASC) 2.5 MG tablet Take 1-2 tablets (2.5-5 mg total) by mouth at bedtime. Take once daily unless blood pressure elevated above 150 then take twice daily   aspirin EC 81 MG tablet Take 81 mg by mouth daily.   Bempedoic Acid (NEXLETOL) 180 MG TABS Take 1 tablet by mouth daily.   Bimatoprost (LUMIGAN OP) Place 1 drop into both eyes at bedtime.    chlorthalidone (HYGROTON) 25 MG tablet Take 1 tablet (25 mg total) by mouth daily.   Cholecalciferol (VITAMIN D-3) 5000 UNITS TABS Take 5,000 mg by mouth daily.   dorzolamide-timolol (COSOPT) 22.3-6.8 MG/ML ophthalmic solution Place 1 drop into both eyes 2 (two) times  daily.    Evolocumab (REPATHA SURECLICK) 1299MG/ML SOAJ INJECT CONTENTS OF 1 PEN ('140MG'$ ) INTO SKIN EVERY 14 DAYS.   famotidine (PEPCID) 20 MG tablet TAKE 1 TABLET BY MOUTH TWICE DAILY.   icosapent Ethyl (VASCEPA) 1 g capsule Take 1 capsule (1 g total) by mouth 2 (two) times daily.   levothyroxine (SYNTHROID) 50 MCG tablet TAKE ONE TABLET BY MOUTH BEFORE BREAKFAST   metoprolol tartrate (LOPRESSOR) 25 MG tablet TAKE ONE TABLET BY MOUTH TWICE DAILY   nitroGLYCERIN (NITROSTAT) 0.4 MG SL tablet Place 1 tablet (0.4 mg total) under the tongue every 5 (five) minutes x 3 doses as needed for chest pain.   pilocarpine (PILOCAR) 1 % ophthalmic solution Place 1 drop into both eyes 2 (two) times daily.    Polyethyl Glycol-Propyl Glycol (SYSTANE OP) Place 1-2 drops into both eyes as needed (for dry eyes).    potassium chloride (KLOR-CON) 10 MEQ tablet Take 2 tablets (20 mEq total) by mouth daily.   Probiotic Product (PROBIOTIC DAILY) CAPS Take 1 capsule by mouth daily. Reported on 10/10/2015   [DISCONTINUED] cephALEXin (KEFLEX) 500 MG capsule Take 1 capsule (500 mg total) by mouth 2 (two) times daily.   No facility-administered encounter medications on file as of 07/19/2022.    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 Dr. Harlow Mares   CARDIAC CATHETERIZATION     COLONOSCOPY     CORONARY STENT INTERVENTION N/A 06/01/2019   Procedure: CORONARY STENT  INTERVENTION;  Surgeon: Sherren Mocha, MD;  Location: [facility];  Service: Cardiovascular;  Laterality: N/A;   CRYOTHERAPY     GYN for CIN III   DILATION AND CURETTAGE OF UTERUS     EXCISION MORTON'S NEUROMA     right foot   EYE SURGERY Right 05/08/2018   LEFT HEART CATH AND CORONARY ANGIOGRAPHY N/A 06/01/2019   Procedure: LEFT HEART CATH AND CORONARY ANGIOGRAPHY;  Surgeon: Sherren Mocha, MD;  Location: [place] [facility];  Service: Cardiovascular;  Laterality: N/A;   MASTECTOMY     REDUCTION MAMMAPLASTY Right    Family History  Problem Relation Age of Onset   Pneumonia Mother    Hyperlipidemia Mother    Parkinsonism Mother    CAD Mother    Other Father        bilateral subdural hematoma   Colon cancer Paternal Grandmother    Hyperlipidemia Sister    Prostate cancer Neg Hx    Breast cancer Neg Hx    Heart disease Neg Hx    Diabetes Neg Hx    Social History   Socioeconomic History   Marital status: Widowed    Spouse name: Not on file   Number of children: 1   Years of education: Not on file   Highest education level: Not on file  Occupational History   Occupation: retired  Tobacco Use   Smoking status: Never    Passive exposure: Never   Smokeless tobacco: Never  Vaping Use   Vaping Use: Never used  Substance and Sexual Activity   Alcohol use: Yes    Comment: Rare   Drug use: No   Sexual activity: Not on file  Other Topics Concern   Not on file  Social History Narrative   Widowed 2019- lives alone. Was  Married 1981. 1 daughter, 2 step sons. 4 grandchildren      Oncologist      Hobbies: going out to eat, activities with church including cancer support group at church   Social Determinants of Health   Financial Resource Strain: Low Risk  (07/19/2022)   Overall Financial Resource Strain (CARDIA)    Difficulty of Paying Living Expenses: Not hard at all  Food Insecurity: No Food Insecurity (07/19/2022)   Hunger Vital Sign    Worried About  Running Out of Food in the Last Year: Never true    Ran Out of Food in the Last Year: Never true  Transportation Needs: No Transportation Needs (07/19/2022)   PRAPARE - Hydrologist (Medical): No    Lack of Transportation (Non-Medical): No  Physical Activity: Inactive (07/19/2022)   Exercise Vital Sign    Days of Exercise per Week: 0 days  Minutes of Exercise per Session: 0 min  Stress: No Stress Concern Present (07/19/2022)   [place]    Feeling of Stress : Only a little  Social Connections: Moderately Integrated (07/19/2022)   Social Connection and Isolation Panel [NHANES]    Frequency of Communication with Friends and Family: More than three times a week    Frequency of Social Gatherings with Friends and Family: Three times a week    Attends Religious Services: More than 4 times per year    Active Member of Clubs or Organizations: Yes    Attends Archivist Meetings: 1 to 4 times per year    Marital Status: Widowed    Tobacco Counseling Counseling given: Not Answered   Clinical Intake:  Pre-visit preparation completed: Yes  Pain : No/denies pain     BMI - recorded: 26.75 Nutritional Status: BMI 25 -29 Overweight Nutritional Risks: None Diabetes: No  How often do you need to have someone help you when you read instructions, pamphlets, or other written materials from your doctor or pharmacy?: 1 - Never  Diabetic?no  Interpreter Needed?: No  Information entered by :: Charlott Rakes, LPN   Activities of Daily Living    07/19/2022    8:38 AM  In your present state of health, do you have any difficulty performing the following activities:  Hearing? 1  Comment wears hearing aids  Vision? 0  Difficulty concentrating or making decisions? 0  Walking or climbing stairs? 0  Dressing or bathing? 0  Doing errands, shopping? 0  Preparing Food and eating ? N  Using  the Toilet? N  In the past six months, have you accidently leaked urine? N  Do you have problems with loss of bowel control? N  Managing your Medications? N  Managing your Finances? N  Housekeeping or managing your Housekeeping? N    Patient Care Team: Marin Olp, MD as PCP - General (Family Medicine) Fay Records, MD as PCP - Cardiology (Cardiology) Magrinat, Virgie Dad, MD (Inactive) (Hematology and Oncology) Neldon Mc, MD (Inactive) (General Surgery) Crissie Reese, MD (Plastic Surgery) Clent Jacks, MD as Consulting Physician (Ophthalmology)  Indicate any recent Medical Services you may have received from other than [facility] providers in the past year (date may be approximate).     Assessment:   This is a routine wellness examination for Anam.  Hearing/Vision screen Hearing Screening - Comments:: Pt wears hearing aids  Vision Screening - Comments:: Pt follows with Dr Ander Slade for anul eye exams   Dietary issues and exercise activities discussed: Current Exercise Habits: The patient does not participate in regular exercise at present   Goals Addressed             This Visit's Progress    Patient Stated       Work through the grief        Depression Screen    07/19/2022    8:34 AM 06/05/2022    9:22 AM 06/01/2021   12:53 PM 04/20/2021   11:09 AM 01/18/2021   10:42 AM 10/03/2020    1:45 PM 05/26/2020    8:18 AM  PHQ 2/9 Scores  PHQ - 2 Score 0 0 0 0 1 0 1  PHQ- 9 Score 0 0 0  3      Fall Risk    07/19/2022    8:38 AM 06/05/2022    9:21 AM 04/20/2021   11:08 AM 05/26/2020  8:23 AM 05/12/2020   10:44 AM  Fall Risk   Falls in the past year? 0 0 0 1 0  Number falls in past yr: 0 0 0 1 0  Injury with Fall? 0  0 0 0  Risk for fall due to : Impaired vision No Fall Risks No Fall Risks Impaired vision;Impaired balance/gait   Follow up Falls prevention discussed  Falls evaluation completed Falls prevention discussed     FALL RISK PREVENTION PERTAINING TO  THE HOME:  Any stairs in or around the home? Yes  If so, are there any without handrails? No  Home free of loose throw rugs in walkways, pet beds, electrical cords, etc? Yes  Adequate lighting in your home to reduce risk of falls? Yes   ASSISTIVE DEVICES UTILIZED TO PREVENT FALLS:  Life alert? Yes CPI watch  Use of a cane, walker or w/c? No  Grab bars in the bathroom? Yes  Shower chair or bench in shower? Yes  Elevated toilet seat or a handicapped toilet? Yes   TIMED UP AND GO:  Was the test performed? Yes .  Length of time to ambulate 10 feet: 10 sec.   Gait steady and fast without use of assistive device  Cognitive Function:        07/19/2022    8:39 AM 06/01/2021    9:17 AM 05/26/2020    8:29 AM  6CIT Screen  What Year? 0 points 0 points 0 points  What month? 0 points 0 points 0 points  What time? 0 points 0 points   Count back from 20 0 points 0 points 0 points  Months in reverse 0 points 0 points 0 points  Repeat phrase 0 points 0 points 0 points  Total Score 0 points 0 points     Immunizations Immunization History  Administered Date(s) Administered   Fluad Quad(high Dose 65+) 04/29/2019, 05/12/2020, 06/01/2021, 05/31/2022, 06/05/2022   Influenza Split 07/24/2011   Influenza Whole 06/15/2009   Influenza, High Dose Seasonal PF 06/29/2013, 07/05/2015, 05/10/2016, 05/21/2017, 06/18/2018   Influenza,inj,Quad PF,6+ Mos 06/21/2014, 06/14/2016   PFIZER(Purple Top)SARS-COV-2 Vaccination 10/05/2019, 10/26/2019, 06/17/2020   Pfizer Covid-19 Vaccine Bivalent Booster 77yr & up 07/10/2021   Pneumococcal Conjugate-13 04/15/2014   Pneumococcal Polysaccharide-23 07/17/2000, 10/02/2016   Tdap 07/17/2010   Zoster Recombinat (Shingrix) 10/15/2018, 03/17/2019    TDAP status: Due, Education has been provided regarding the importance of this vaccine. Advised may receive this vaccine at local pharmacy or Health Dept. Aware to provide a copy of the vaccination record if obtained  from local pharmacy or Health Dept. Verbalized acceptance and understanding.  Flu Vaccine status: Up to date  Pneumococcal vaccine status: Up to date  Covid-19 vaccine status: Completed vaccines  Qualifies for Shingles Vaccine? Yes   Zostavax completed Yes   Shingrix Completed?: Yes  Screening Tests Health Maintenance  Topic Date Due   TETANUS/TDAP  04/19/2023 (Originally 07/17/2020)   COVID-19 Vaccine (5 - Pfizer risk series) 06/01/2023 (Originally 09/04/2021)   MAMMOGRAM  05/22/2023   Medicare Annual Wellness (AWV)  07/20/2023   COLONOSCOPY (Pts 45-422yrInsurance coverage will need to be confirmed)  09/23/2023   Pneumonia Vaccine 6513Years old  Completed   INFLUENZA VACCINE  Completed   DEXA SCAN  Completed   Zoster Vaccines- Shingrix  Completed   HPV VACCINES  Aged Out    Health Maintenance  There are no preventive care reminders to display for this patient.   Colorectal cancer screening: Type of screening: Colonoscopy. Completed  09/21/18. Repeat every 5 years  Mammogram status: Completed 05/21/22. Repeat every year  Bone Density status: Completed 10/29/16. Results reflect: Bone density results: OSTEOPOROSIS. Repeat every 2 years.   Additional Screening:   Vision Screening: Recommended annual ophthalmology exams for early detection of glaucoma and other disorders of the eye. Is the patient up to date with their annual eye exam?  Yes  Who is the provider or what is the name of the office in which the patient attends annual eye exams? Dr Ander Slade  If pt is not established with a provider, would they like to be referred to a provider to establish care? No .   Dental Screening: Recommended annual dental exams for proper oral hygiene  Community Resource Referral / Chronic Care Management: CRR required this visit?  No   CCM required this visit?  No      Plan:     I have personally reviewed and noted the following in the patient's chart:   Medical and social  history Use of alcohol, tobacco or illicit drugs  Current medications and supplements including opioid prescriptions. Patient is not currently taking opioid prescriptions. Functional ability and status Nutritional status Physical activity Advanced directives List of other physicians Hospitalizations, surgeries, and ER visits in previous 12 months Vitals Screenings to include cognitive, depression, and falls Referrals and appointments  In addition, I have reviewed and discussed with patient certain preventive protocols, quality metrics, and best practice recommendations. A written personalized care plan for preventive services as well as general preventive health recommendations were provided to patient.     Willette Brace, LPN   [id number]   Nurse Notes: none

## 2022-07-19 NOTE — Patient Instructions (Signed)
[name] , Thank you for taking time to come for your Medicare Wellness Visit. I appreciate your ongoing commitment to your health goals. Please review the following plan we discussed and let me know if I can assist you in the future.   These are the goals we discussed:  Goals      Patient Stated     Continue to stay active in lunch Lunch bunch      Patient Stated     Stay healthy and active     Weight (lb) < 200 lb (90.7 kg)     Will go back to weight watchers Will start exercising with spouse for weight bearing Will buddy up with your friend         This is a list of the screening recommended for you and due dates:  Health Maintenance  Topic Date Due   Tetanus Vaccine  04/19/2023*   COVID-19 Vaccine (5 - Pfizer risk series) 06/01/2023*   Mammogram  05/22/2023   Medicare Annual Wellness Visit  07/20/2023   Colon Cancer Screening  09/23/2023   Pneumonia Vaccine  Completed   Flu Shot  Completed   DEXA scan (bone density measurement)  Completed   Zoster (Shingles) Vaccine  Completed   HPV Vaccine  Aged Out  *Topic was postponed. The date shown is not the original due date.    Advanced directives: Please bring a copy of your health care power of attorney and living will to the office at your convenience.  Conditions/risks identified: work through the grief   Next appointment: Follow up in one year for your annual wellness visit    Preventive Care 31 Years and Older, Female Preventive care refers to lifestyle choices and visits with your health care provider that can promote health and wellness. What does preventive care include? A yearly physical exam. This is also called an annual well check. Dental exams once or twice a year. Routine eye exams. Ask your health care provider how often you should have your eyes checked. Personal lifestyle choices, including: Daily care of your teeth and gums. Regular physical activity. Eating a healthy diet. Avoiding tobacco and  drug use. Limiting alcohol use. Practicing safe sex. Taking low-dose aspirin every day. Taking vitamin and mineral supplements as recommended by your health care provider. What happens during an annual well check? The services and screenings done by your health care provider during your annual well check will depend on your age, overall health, lifestyle risk factors, and family history of disease. Counseling  Your health care provider may ask you questions about your: Alcohol use. Tobacco use. Drug use. Emotional well-being. Home and relationship well-being. Sexual activity. Eating habits. History of falls. Memory and ability to understand (cognition). Work and work Statistician. Reproductive health. Screening  You may have the following tests or measurements: Height, weight, and BMI. Blood pressure. Lipid and cholesterol levels. These may be checked every 5 years, or more frequently if you are over 85 years old. Skin check. Lung cancer screening. You may have this screening every year starting at age 72 if you have a 30-pack-year history of smoking and currently smoke or have quit within the past 15 years. Fecal occult blood test (FOBT) of the stool. You may have this test every year starting at age 92. Flexible sigmoidoscopy or colonoscopy. You may have a sigmoidoscopy every 5 years or a colonoscopy every 10 years starting at age 70. Hepatitis C blood test. Hepatitis B blood test. Sexually transmitted disease (  STD) testing. Diabetes screening. This is done by checking your blood sugar (glucose) after you have not eaten for a while (fasting). You may have this done every 1-3 years. Bone density scan. This is done to screen for osteoporosis. You may have this done starting at age 27. Mammogram. This may be done every 1-2 years. Talk to your health care provider about how often you should have regular mammograms. Talk with your health care provider about your test results, treatment  options, and if necessary, the need for more tests. Vaccines  Your health care provider may recommend certain vaccines, such as: Influenza vaccine. This is recommended every year. Tetanus, diphtheria, and acellular pertussis (Tdap, Td) vaccine. You may need a Td booster every 10 years. Zoster vaccine. You may need this after age 37. Pneumococcal 13-valent conjugate (PCV13) vaccine. One dose is recommended after age 79. Pneumococcal polysaccharide (PPSV23) vaccine. One dose is recommended after age 35. Talk to your health care provider about which screenings and vaccines you need and how often you need them. This information is not intended to replace advice given to you by your health care provider. Make sure you discuss any questions you have with your health care provider. Document Released: 09/23/2015 Document Revised: 05/16/2016 Document Reviewed: 06/28/2015 Elsevier Interactive Patient Education  2017 [place] Prevention in the Home Falls can cause injuries. They can happen to people of all ages. There are many things you can do to make your home safe and to help prevent falls. What can I do on the outside of my home? Regularly fix the edges of walkways and driveways and fix any cracks. Remove anything that might make you trip as you walk through a door, such as a raised step or threshold. Trim any bushes or trees on the path to your home. Use bright outdoor lighting. Clear any walking paths of anything that might make someone trip, such as rocks or tools. Regularly check to see if handrails are loose or broken. Make sure that both sides of any steps have handrails. Any raised decks and porches should have guardrails on the edges. Have any leaves, snow, or ice cleared regularly. Use sand or salt on walking paths during winter. Clean up any spills in your garage right away. This includes oil or grease spills. What can I do in the bathroom? Use night lights. Install grab  bars by the toilet and in the tub and shower. Do not use towel bars as grab bars. Use non-skid mats or decals in the tub or shower. If you need to sit down in the shower, use a plastic, non-slip stool. Keep the floor dry. Clean up any water that spills on the floor as soon as it happens. Remove soap buildup in the tub or shower regularly. Attach bath mats securely with double-sided non-slip rug tape. Do not have throw rugs and other things on the floor that can make you trip. What can I do in the bedroom? Use night lights. Make sure that you have a light by your bed that is easy to reach. Do not use any sheets or blankets that are too big for your bed. They should not hang down onto the floor. Have a firm chair that has side arms. You can use this for support while you get dressed. Do not have throw rugs and other things on the floor that can make you trip. What can I do in the kitchen? Clean up any spills right away. Avoid walking on  wet floors. Keep items that you use a lot in easy-to-reach places. If you need to reach something above you, use a strong step stool that has a grab bar. Keep electrical cords out of the way. Do not use floor polish or wax that makes floors slippery. If you must use wax, use non-skid floor wax. Do not have throw rugs and other things on the floor that can make you trip. What can I do with my stairs? Do not leave any items on the stairs. Make sure that there are handrails on both sides of the stairs and use them. Fix handrails that are broken or loose. Make sure that handrails are as long as the stairways. Check any carpeting to make sure that it is firmly attached to the stairs. Fix any carpet that is loose or worn. Avoid having throw rugs at the top or bottom of the stairs. If you do have throw rugs, attach them to the floor with carpet tape. Make sure that you have a light switch at the top of the stairs and the bottom of the stairs. If you do not have them,  ask someone to add them for you. What else can I do to help prevent falls? Wear shoes that: Do not have high heels. Have rubber bottoms. Are comfortable and fit you well. Are closed at the toe. Do not wear sandals. If you use a stepladder: Make sure that it is fully opened. Do not climb a closed stepladder. Make sure that both sides of the stepladder are locked into place. Ask someone to hold it for you, if possible. Clearly mark and make sure that you can see: Any grab bars or handrails. First and last steps. Where the edge of each step is. Use tools that help you move around (mobility aids) if they are needed. These include: Canes. Walkers. Scooters. Crutches. Turn on the lights when you go into a dark area. Replace any light bulbs as soon as they burn out. Set up your furniture so you have a clear path. Avoid moving your furniture around. If any of your floors are uneven, fix them. If there are any pets around you, be aware of where they are. Review your medicines with your doctor. Some medicines can make you feel dizzy. This can increase your chance of falling. Ask your doctor what other things that you can do to help prevent falls. This information is not intended to replace advice given to you by your health care provider. Make sure you discuss any questions you have with your health care provider. Document Released: 06/23/2009 Document Revised: 02/02/2016 Document Reviewed: 10/01/2014 Elsevier Interactive Patient Education  2017 [employer].

## 2022-07-19 NOTE — Patient Instructions (Addendum)
Please stop by lab before you go If you have mychart- we will send your results within 3 business days of [place] receiving them.  If you do not have mychart- we will call you about results within 5 business days of [place] receiving them.  *please also note that you will see labs on mychart as soon as they post. I will later go in and write notes on them- will say "notes from Dr. Yong Channel"    recommended instead if blood pressure on lower side getting some hydration and electrolytes - such as having some gatorade  Recommended follow up: Return in about 2 months (around 09/18/2022) for followup or sooner if needed.Schedule b4 you leave.

## 2022-07-20 ENCOUNTER — Other Ambulatory Visit: Payer: Self-pay | Admitting: Internal Medicine

## 2022-07-20 ENCOUNTER — Telehealth: Payer: Self-pay

## 2022-07-20 NOTE — Telephone Encounter (Signed)
     Patient  visit on 11/1  at [facility] you been able to follow up with your primary care physician? YES   The patient was or was not able to obtain any needed medicine or equipment. YES  Are there diet recommendations that you are having difficulty following? NA  Patient expresses understanding of discharge instructions and education provided has no other needs at this time.  Anna Maria, [facility], Care Management  [phone] [address], [place], [place] [address] Phone: [phone] Email: [email or web address]

## 2022-07-21 LAB — URINE CULTURE
MICRO NUMBER:: 14167582
SPECIMEN QUALITY:: ADEQUATE

## 2022-07-23 ENCOUNTER — Other Ambulatory Visit: Payer: Self-pay | Admitting: Family Medicine

## 2022-07-23 DIAGNOSIS — Z961 Presence of intraocular lens: Secondary | ICD-10-CM | POA: Diagnosis not present

## 2022-07-23 DIAGNOSIS — H401422 Capsular glaucoma with pseudoexfoliation of lens, left eye, moderate stage: Secondary | ICD-10-CM | POA: Diagnosis not present

## 2022-07-23 DIAGNOSIS — H401413 Capsular glaucoma with pseudoexfoliation of lens, right eye, severe stage: Secondary | ICD-10-CM | POA: Diagnosis not present

## 2022-07-23 MED ORDER — AMOXICILLIN 500 MG PO CAPS: 500.0000 mg | ORAL_CAPSULE | Freq: Three times a day (TID) | ORAL | 0 refills | Status: AC

## 2022-08-10 ENCOUNTER — Other Ambulatory Visit (HOSPITAL_BASED_OUTPATIENT_CLINIC_OR_DEPARTMENT_OTHER): Payer: Self-pay

## 2022-08-10 MED ORDER — COVID-19 MRNA VAC-TRIS(PFIZER) 30 MCG/0.3ML IM SUSY
0.3000 mL | PREFILLED_SYRINGE | Freq: Once | INTRAMUSCULAR | 0 refills | Status: AC
  Filled 2022-08-10: qty 0.3, 1d supply, fill #0

## 2022-08-12 NOTE — Progress Notes (Unsigned)
Cardiology Office Note   Date:  08/13/2022   ID:  [name], DOB 21-Jun-1941, MRN [id number]  PCP:  Marin Olp, MD  Cardiologist:   Dorris Carnes, MD    Patient presents for f/u of CAD    History of Present Illness: [name] is a 81 y.o. female with a history of HTN, HL and  CAD  She was admitted in April 2020 with CP   CT chest/abd showed extensive atherosclerosis.  Cardac CT angiogram showed no obstructive lesons  In  September2020 she was admtted with an NSTEMI.  Cardiac catheterization showed severe single-vessel disease with a critical stenosis of the left circumflex.  She underwent PTCA/DES to this area.  There was nonobstructive disease noted in the LAD.  Plan for dual antiplatelet therapy for 12 mont The pt went to ED on 12/2020 with chest pressure   Felt noncardiac  Spring 2022:   [name] after that was normal     I sawa the pt in March 2023  Since then she says she has checked her BP    makes her very anxious   She brings in log today of recent readings     Range 110s to 160s (only occasional)   Average reading 120s  She denies CP  Breathing is OK   She remains very activie at home and in church  She remains tearful and sad   Still mourning her husbands death  Current Meds  Medication Sig   amLODipine (NORVASC) 2.5 MG tablet Take 1-2 tablets (2.5-5 mg total) by mouth at bedtime. Take once daily unless blood pressure elevated above 150 then take twice daily   Ascorbic Acid (VITAMIN C PO) Take by mouth. 1 gummy daily   aspirin EC 81 MG tablet Take 81 mg by mouth daily.   Bempedoic Acid (NEXLETOL) 180 MG TABS Take 1 tablet by mouth daily.   Bimatoprost (LUMIGAN OP) Place 1 drop into both eyes at bedtime.    chlorthalidone (HYGROTON) 25 MG tablet Take 1 tablet (25 mg total) by mouth daily. Pt needs to keep upcoming appt in Dec for further refills   Cholecalciferol (VITAMIN D-3) 5000 UNITS TABS Take 5,000 mg by mouth daily.   dorzolamide-timolol  (COSOPT) 22.3-6.8 MG/ML ophthalmic solution Place 1 drop into both eyes 2 (two) times daily.    Evolocumab (REPATHA SURECLICK) 702 MG/ML SOAJ INJECT CONTENTS OF 1 PEN ('140MG'$ ) INTO SKIN EVERY 14 DAYS.   famotidine (PEPCID) 20 MG tablet TAKE 1 TABLET BY MOUTH TWICE DAILY.   icosapent Ethyl (VASCEPA) 1 g capsule Take 1 capsule (1 g total) by mouth 2 (two) times daily.   levothyroxine (SYNTHROID) 50 MCG tablet TAKE ONE TABLET BY MOUTH BEFORE BREAKFAST   nitroGLYCERIN (NITROSTAT) 0.4 MG SL tablet Place 1 tablet (0.4 mg total) under the tongue every 5 (five) minutes x 3 doses as needed for chest pain. Pt needs to keep upcoming appt in Dec for further refills   pilocarpine (PILOCAR) 1 % ophthalmic solution Place 1 drop into both eyes 2 (two) times daily.    Polyethyl Glycol-Propyl Glycol (SYSTANE OP) Place 1-2 drops into both eyes as needed (for dry eyes).    potassium chloride (KLOR-CON) 10 MEQ tablet Take 2 tablets (20 mEq total) by mouth daily.   Probiotic Product (PROBIOTIC DAILY) CAPS Take 1 capsule by mouth daily. Reported on 10/10/2015   [DISCONTINUED] metoprolol tartrate (LOPRESSOR) 25 MG tablet TAKE ONE TABLET BY MOUTH TWICE DAILY  Allergies:   Adhesive [tape] and Rosuvastatin   Past Medical History:  Diagnosis Date   Abnormal thyroid function test 07/01/2013   Anxiety    Arthritis    Cancer ([place])    Coronary artery disease    Dermatitis 11/25/2013   Glaucoma 11/02/2012   HTN (hypertension) 11/02/2012   hx: breast cancer, IDC (Microinvasive) w DCIS, 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 - 07/10/2011   Hyperlipidemia    Hypothyroid    Obesity     Past Surgical History:  Procedure Laterality Date   AUGMENTATION MAMMAPLASTY Left    05/2006   BREAST SURGERY  10/2005   Left Mastectomy, reconstruction, reduction on right. Dr. Harlow Mares   CARDIAC CATHETERIZATION     COLONOSCOPY     CORONARY STENT INTERVENTION N/A 06/01/2019   Procedure: CORONARY STENT INTERVENTION;  Surgeon: Sherren Mocha, MD;  Location:  [place] [facility];  Service: Cardiovascular;  Laterality: N/A;   CRYOTHERAPY     GYN for CIN III   DILATION AND CURETTAGE OF UTERUS     EXCISION MORTON'S NEUROMA     right foot   EYE SURGERY Right 05/08/2018   LEFT HEART CATH AND CORONARY ANGIOGRAPHY N/A 06/01/2019   Procedure: LEFT HEART CATH AND CORONARY ANGIOGRAPHY;  Surgeon: Sherren Mocha, MD;  Location: [facility];  Service: Cardiovascular;  Laterality: N/A;   MASTECTOMY     REDUCTION MAMMAPLASTY Right      Social History:  The patient  reports that she has never smoked. She has never been exposed to tobacco smoke. She has never used smokeless tobacco. She reports current alcohol use. She reports that she does not use drugs.   Family History:  The patient's family history includes CAD in her mother; Colon cancer in her paternal grandmother; Hyperlipidemia in her mother and sister; Other in her father; Parkinsonism in her mother; Pneumonia in her mother.    ROS:  Please see the history of present illness. All other systems are reviewed and  Negative to the above problem except as noted.    PHYSICAL EXAM: VS:  BP 138/82   Pulse 62   Ht '5\' 3"'$  (1.6 m)   Wt 151 lb (68.5 kg)   BMI 26.75 kg/m   GEN: Pt is a 81 yo female in no acute distress  HEENT: normal  Neck: no JVD  No bruits Cardiac: RRR; no murmur  No LE edema  Respiratory:  CTA  GI: soft, nontender, nondistended, + BS   Skin: warm and dry, no rash Neuro:  Strength and sensation are intact Psych: euthymic mood, full affect   EKG:  EKG is not ordered today.  SR 64 bpm      Lipid Panel    Component Value Date/Time   CHOL 142 07/19/2022 1001   CHOL 131 10/27/2020 0936   TRIG 98.0 07/19/2022 1001   HDL 64.20 07/19/2022 1001   HDL 64 10/27/2020 0936   CHOLHDL 2 07/19/2022 1001   VLDL 19.6 07/19/2022 1001   LDLCALC 58 07/19/2022 1001   LDLCALC 53 10/27/2020 0936   LDLCALC 30 05/12/2020 1136   LDLDIRECT 54.0 06/01/2021 0829      Wt Readings from Last  3 Encounters:  08/13/22 151 lb (68.5 kg)  07/19/22 151 lb (68.5 kg)  07/19/22 151 lb (68.5 kg)    Cardiac Studies  CORONARY STENT INTERVENTION 05/2019  LEFT HEART CATH AND CORONARY ANGIOGRAPHY  Conclusion   1) Severe single vessel CAD with critical stenosis of the mid-circumflex, treated successfully  with PTCA and stenting (2.5x12 mm Synergy DES 2) nonobstructive LAD stenosis 3) widely patent RCA, dominant vessel 4) normal LV systolic function   Recommend: DAPT with ASA and ticagrelor x 12 months without interruption. Pt loaded with ticagrelor 180 mg on the cath lab table. DC home tomorrow am if no complications arise.      ASSESSMENT AND PLAN:  1  CAD  Pt denies symptoms   Follow    2  HTN  BP ioverall good   SHe does not have to check as often    If over 160 can take 1/2 amlodipine as needed     3  HL  LDL 58  HDL 64  Trig 98  Very good    4.  PAD atherosclerosis of aorta  Cnotinue meds   Asymptomatic    Follow up next summer   Current medicines are reviewed at length with the patient today.  The patient does not have concerns regarding medicines.  Signed, Dorris Carnes, MD  08/13/2022 10:43 PM    [facility] [place], [place], [place]  [address] Phone: [phone]; Fax: [phone]

## 2022-08-13 ENCOUNTER — Ambulatory Visit: Payer: Medicare PPO | Attending: Internal Medicine | Admitting: Internal Medicine

## 2022-08-13 ENCOUNTER — Encounter: Payer: Self-pay | Admitting: Internal Medicine

## 2022-08-13 ENCOUNTER — Other Ambulatory Visit: Payer: Self-pay | Admitting: Family Medicine

## 2022-08-13 VITALS — BP 138/82 | HR 62 | Ht 63.0 in | Wt 151.0 lb

## 2022-08-13 DIAGNOSIS — I2510 Atherosclerotic heart disease of native coronary artery without angina pectoris: Secondary | ICD-10-CM | POA: Diagnosis not present

## 2022-08-13 MED ORDER — CHLORTHALIDONE 25 MG PO TABS: 25.0000 mg | ORAL_TABLET | Freq: Every day | ORAL | 3 refills | Status: DC

## 2022-08-13 NOTE — Patient Instructions (Signed)
Medication Instructions:   *If you need a refill on your cardiac medications before your next appointment, please call your pharmacy*   Lab Work:  If you have labs (blood work) drawn today and your tests are completely normal, you will receive your results only by: [place] (if you have MyChart) OR A paper copy in the mail If you have any lab test that is abnormal or we need to change your treatment, we will call you to review the results.   Testing/Procedures:    Follow-Up: At [facility], you and your health needs are our priority.  As part of our continuing mission to provide you with exceptional heart care, we have created designated Provider Care Teams.  These Care Teams include your primary Cardiologist (physician) and Advanced Practice Providers (APPs -  Physician Assistants and Nurse Practitioners) who all work together to provide you with the care you need, when you need it.  We recommend signing up for the patient portal called "MyChart".  Sign up information is provided on this After Visit Summary.  MyChart is used to connect with patients for Virtual Visits (Telemedicine).  Patients are able to view lab/test results, encounter notes, upcoming appointments, etc.  Non-urgent messages can be sent to your provider as well.   To learn more about what you can do with MyChart, go to [email or web address].    Your next appointment:   6 month(s)  The format for your next appointment:   In Person  Provider:   Dorris Carnes, MD     Other Instructions   Important Information About [place]

## 2022-08-22 ENCOUNTER — Other Ambulatory Visit (HOSPITAL_BASED_OUTPATIENT_CLINIC_OR_DEPARTMENT_OTHER): Payer: Self-pay

## 2022-08-24 ENCOUNTER — Other Ambulatory Visit (HOSPITAL_BASED_OUTPATIENT_CLINIC_OR_DEPARTMENT_OTHER): Payer: Self-pay

## 2022-08-24 MED ORDER — AREXVY 120 MCG/0.5ML IM SUSR
INTRAMUSCULAR | 0 refills | Status: DC
  Filled 2022-08-24: qty 0.5, 1d supply, fill #0

## 2022-09-13 ENCOUNTER — Encounter: Payer: Self-pay | Admitting: *Deleted

## 2022-09-13 ENCOUNTER — Telehealth: Payer: Self-pay | Admitting: *Deleted

## 2022-09-13 NOTE — Patient Instructions (Signed)
Visit Information  Thank you for taking time to visit with me today. Please don't hesitate to contact me if I can be of assistance to you.   Following are the goals we discussed today:   Goals Addressed             This Visit's Progress    COMPLETED: Care coordination activity       Care Coordination Interventions: Reviewed medications with patient and discussed Adherence with no needed refills Reviewed scheduled/upcoming provider appointments including sufficient transportation source Screening for signs and symptoms of depression related to chronic disease state  Assessed social determinant of health barriers Educated on care management services involving social worker, nurse care manager and pharmacy          Please call the care guide team at [phone] if you need to cancel or reschedule your appointment.   If you are experiencing a Mental Health or [place] or need someone to talk to, please call the Suicide and Crisis Lifeline: 988  Patient verbalizes understanding of instructions and care plan provided today and agrees to view in [place]. Active MyChart status and patient understanding of how to access instructions and care plan via MyChart confirmed with patient.     No further follow up required: No follow up needs  Raina Mina, RN Care Management Coordinator [place] Office [phone]

## 2022-09-13 NOTE — Patient Outreach (Signed)
  Care Coordination   Initial Visit Note   09/13/2022 Name: [name] MRN: [id number] DOB: 01/03/1941  [name] is a 82 y.o. year old female who sees Yong Channel, Brayton Mars, MD for primary care. I spoke with  [name] by phone today.  What matters to the patients health and wellness today?  No needs    Goals Addressed             This Visit's Progress    COMPLETED: Care coordination activity       Care Coordination Interventions: Reviewed medications with patient and discussed Adherence with no needed refills Reviewed scheduled/upcoming provider appointments including sufficient transportation source Screening for signs and symptoms of depression related to chronic disease state  Assessed social determinant of health barriers Educated on care management services involving social worker, Designer, jewellery and pharmacy          SDOH assessments and interventions completed:  Yes  SDOH Interventions Today    Flowsheet Row Most Recent Value  SDOH Interventions   Food Insecurity Interventions Intervention Not Indicated  Housing Interventions Intervention Not Indicated  Transportation Interventions Intervention Not Indicated  Utilities Interventions Intervention Not Indicated        Care Coordination Interventions:  Yes, provided   Follow up plan: No further intervention required.   Encounter Outcome:  Pt. Visit Completed   Raina Mina, RN Care Management Coordinator [place] Office [phone]

## 2022-09-14 DIAGNOSIS — H401422 Capsular glaucoma with pseudoexfoliation of lens, left eye, moderate stage: Secondary | ICD-10-CM | POA: Diagnosis not present

## 2022-09-14 DIAGNOSIS — H401413 Capsular glaucoma with pseudoexfoliation of lens, right eye, severe stage: Secondary | ICD-10-CM | POA: Diagnosis not present

## 2022-09-17 ENCOUNTER — Other Ambulatory Visit (HOSPITAL_BASED_OUTPATIENT_CLINIC_OR_DEPARTMENT_OTHER): Payer: Self-pay

## 2022-09-17 MED ORDER — TETANUS-DIPHTH-ACELL PERTUSSIS 5-2.5-18.5 LF-MCG/0.5 IM SUSY
PREFILLED_SYRINGE | INTRAMUSCULAR | 0 refills | Status: DC
  Filled 2022-09-17: qty 0.5, 1d supply, fill #0

## 2022-09-19 ENCOUNTER — Encounter: Payer: Self-pay | Admitting: Family Medicine

## 2022-09-19 ENCOUNTER — Other Ambulatory Visit (HOSPITAL_COMMUNITY): Payer: Self-pay

## 2022-09-19 ENCOUNTER — Ambulatory Visit: Payer: Medicare PPO | Admitting: Family Medicine

## 2022-09-19 VITALS — BP 127/67 | HR 78 | Temp 97.6°F | Ht 63.0 in | Wt 150.6 lb

## 2022-09-19 DIAGNOSIS — I700 Atherosclerosis of aorta: Secondary | ICD-10-CM

## 2022-09-19 DIAGNOSIS — E039 Hypothyroidism, unspecified: Secondary | ICD-10-CM | POA: Diagnosis not present

## 2022-09-19 DIAGNOSIS — I2510 Atherosclerotic heart disease of native coronary artery without angina pectoris: Secondary | ICD-10-CM

## 2022-09-19 DIAGNOSIS — I10 Essential (primary) hypertension: Secondary | ICD-10-CM

## 2022-09-19 DIAGNOSIS — E782 Mixed hyperlipidemia: Secondary | ICD-10-CM | POA: Diagnosis not present

## 2022-09-19 NOTE — Progress Notes (Signed)
Phone [phone] In person visit   Subjective:   [name] is a 82 y.o. year old very pleasant female patient who presents for/with See problem oriented charting Chief Complaint  Patient presents with   Follow-up   Hypertension   Past Medical History-  Patient Active Problem List   Diagnosis Date Noted   CAD (coronary artery disease) s/p stent mCx 05/2019 06/02/2019    Priority: High   Grieving 02/18/2017    Priority: High   Osteoporosis 11/05/2016    Priority: High   Aortic atherosclerosis ([place]) 09/15/2020    Priority: Medium    Chest pain 12/29/2018    Priority: Medium    Primary open-angle glaucoma 11/20/2017    Priority: Medium    Hypothyroidism 07/01/2013    Priority: Medium    Hypertension 11/02/2012    Priority: Medium    Hyperlipidemia 07/24/2011    Priority: Medium    hx: breast cancer, IDC (Microinvasive) w DCIS, 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 - 07/10/2011    Priority: Medium    History of adenomatous polyp of colon 09/30/2018    Priority: Low   Medicare annual wellness visit, subsequent 09/26/2014    Priority: Low   Vitamin D deficiency 11/02/2012    Priority: Low   Obesity (BMI 30-39.9) 07/24/2011    Priority: Low   PAD (peripheral artery disease) ([place]) 09/15/2020   Statin myopathy 07/01/2019   Hypokalemia 06/02/2019   NSTEMI (non-ST elevated myocardial infarction) ([place]) 05/31/2019   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 Take once daily unless blood pressure elevated above 150 then take twice daily 180 tablet 3    Ascorbic Acid (VITAMIN C PO) Take by mouth. 1 gummy daily     aspirin EC 81 MG tablet Take 81 mg by mouth daily.     Bempedoic Acid (NEXLETOL) 180 MG TABS Take 1 tablet by mouth daily. 90 tablet 2   Bimatoprost (LUMIGAN OP) Place 1 drop into both eyes at bedtime.      chlorthalidone (HYGROTON) 25 MG tablet Take 1 tablet (25 mg total) by mouth daily. Pt needs to keep upcoming appt in Dec for further refills 90 tablet 3   Cholecalciferol (VITAMIN D-3) 5000 UNITS TABS Take 5,000 mg by mouth daily.     dorzolamide-timolol (COSOPT) 22.3-6.8 MG/ML ophthalmic solution Place 1 drop into both eyes 2 (two) times daily.      Evolocumab (REPATHA SURECLICK) 916 MG/ML SOAJ INJECT CONTENTS OF 1 PEN ('140MG'$ ) INTO SKIN EVERY 14 DAYS. 2 mL 11   famotidine (PEPCID) 20 MG tablet TAKE 1 TABLET BY MOUTH TWICE DAILY. 180 tablet 3   icosapent Ethyl (VASCEPA) 1 g capsule Take 1 capsule (1 g total) by mouth 2 (two) times daily. 180 capsule 3   levothyroxine (SYNTHROID) 50 MCG tablet TAKE ONE TABLET BY MOUTH BEFORE BREAKFAST 90 tablet 3   metoprolol tartrate (LOPRESSOR) 25 MG tablet TAKE ONE TABLET BY MOUTH TWICE DAILY 180 tablet 0   nitroGLYCERIN (NITROSTAT) 0.4 MG SL tablet Place 1 tablet (0.4 mg total) under the tongue every 5 (five) minutes x 3 doses  as needed for chest pain. Pt needs to keep upcoming appt in Dec for further refills 25 tablet 0   pilocarpine (PILOCAR) 1 % ophthalmic solution Place 1 drop into both eyes 2 (two) times daily.      Polyethyl Glycol-Propyl Glycol (SYSTANE OP) Place 1-2 drops into both eyes as needed (for dry eyes).      potassium chloride (KLOR-CON) 10 MEQ tablet Take 2 tablets (20 mEq total) by mouth daily. 180 tablet 3   Probiotic Product (PROBIOTIC DAILY) CAPS Take 1 capsule by mouth daily. Reported on 10/10/2015     RSV vaccine recomb adjuvanted (AREXVY) 120 MCG/0.5ML injection Inject into the muscle. 0.5 mL 0   Tdap (BOOSTRIX) 5-2.5-18.5 LF-MCG/0.5 injection Inject into the muscle. 0.5  mL 0   No current facility-administered medications for this visit.     Objective:  BP 127/67 (BP Location: Right Arm, Patient Position: Sitting)   Pulse 78   Temp 97.6 F (36.4 C) (Temporal)   Ht '5\' 3"'$  (1.6 m)   Wt 150 lb 9.6 oz (68.3 kg)   SpO2 98%   BMI 26.68 kg/m  Gen: NAD, resting comfortably CV: RRR no murmurs rubs or gallops Lungs: CTAB no crackles, wheeze, rhonchi  Ext: no edema Skin: warm, dry     Assessment and Plan   #social update-still going to do grieving group at church- states very helpful-  lead from [facility]. Has a close friendship she has developed there  #Glaucoma- drops are not the easiest but working closely with [facility]  #CAD-follows with Dr. Harrington Challenger on aspirin #hyperlipidemia with aortic atherosclerosis S: Medication: Repatha 140 mg every 2 weeks, bempedoic acid '180mg'$  daily and Vascepa 1 g  (prior clear study- stopped mid 2022), aspirin 81 mg -plans to restart at the gym -no chest pain or shortness of breath  Lab Results  Component Value Date   CHOL 142 07/19/2022   HDL 64.20 07/19/2022   LDLCALC 58 07/19/2022   LDLDIRECT 54.0 06/01/2021   TRIG 98.0 07/19/2022   CHOLHDL 2 07/19/2022  A/P: CAD asymptomatic- continue current medications  Lipds- Excellent control on most recent check-continue current medication Aortic atherosclerosis with LDL at goal under 70- likely stable- continue current medications   #hypertension S: medication: Metoprolol 25 mg twice daily, amlodipine 2.5 mg (with 2nd dose if needed for high BP over 150 given variability), chlorthalidone 25 mg with potassium daily Home readings #s: not checking as much- was anxiety provoking BP Readings from Last 3 Encounters:  09/19/22 127/67  08/13/22 138/82  07/19/22 120/76  A/P:  stable- continue current medicines  #hypothyroidism S: compliant On thyroid medication-levothyroxine 50 mcg Lab Results  Component Value Date   TSH 1.608 07/11/2022   A/P:  stable- continue  current medicines   Recommended follow up: Return in about 2 months (around 11/18/2022) for followup or sooner if needed.Schedule b4 you leave. Per her preference- helps her mentally to have close check ins Future Appointments  Date Time Provider [place]  08/01/2023  9:00 AM [facility] HEALTH COACH [facility]   Lab/Order associations:   ICD-10-CM   1. Coronary artery disease involving native coronary artery of native heart without angina pectoris  I25.10     2. Hypothyroidism, unspecified type  E03.9     3. Primary hypertension  I10     4. Mixed hyperlipidemia  E78.2     5. Aortic atherosclerosis (HCC) Chronic I70.0       No orders of the defined types were placed in  this encounter.   Return precautions advised.  Garret Reddish, MD

## 2022-09-19 NOTE — Patient Instructions (Addendum)
Thrilled you are doing so well!   Recommended follow up: Return in about 2 months (around 11/18/2022) for followup or sooner if needed.Schedule b4 you leave.

## 2022-09-25 ENCOUNTER — Other Ambulatory Visit: Payer: Self-pay | Admitting: Family Medicine

## 2022-10-01 DIAGNOSIS — H401413 Capsular glaucoma with pseudoexfoliation of lens, right eye, severe stage: Secondary | ICD-10-CM | POA: Diagnosis not present

## 2022-10-01 DIAGNOSIS — H401422 Capsular glaucoma with pseudoexfoliation of lens, left eye, moderate stage: Secondary | ICD-10-CM | POA: Diagnosis not present

## 2022-11-05 ENCOUNTER — Encounter: Payer: Self-pay | Admitting: Family Medicine

## 2022-11-05 ENCOUNTER — Ambulatory Visit: Payer: Medicare PPO | Admitting: Family Medicine

## 2022-11-05 VITALS — BP 142/70 | HR 72 | Temp 97.2°F | Ht 63.0 in | Wt 150.0 lb

## 2022-11-05 DIAGNOSIS — E039 Hypothyroidism, unspecified: Secondary | ICD-10-CM | POA: Diagnosis not present

## 2022-11-05 DIAGNOSIS — I10 Essential (primary) hypertension: Secondary | ICD-10-CM | POA: Diagnosis not present

## 2022-11-05 NOTE — Patient Instructions (Addendum)
Blood pressure is slightly high today- we would prefer <140/90 but you have high variability so we do not want to overreact either -continue metoprolol 25 mg twice a day - continue chlorthalidone in the morning -continue amlodipine 2.5 mg in the evening  -if your blood pressure is above 150 on the top # you can take an extra half or whole amlodipine 2.5 mg  I would minimize checks since you have some anxiety with checks.   If your blood pressure is over 200 with no symptoms and doesn't respond to amlodipine you can seek care in ED  If you have symptoms- seek care regardless- chest pain or shortness of breath or blurry vision or severe headaches  Recommended follow up: Return for next already scheduled visit or sooner if needed.

## 2022-11-05 NOTE — Progress Notes (Signed)
Phone [phone] In person visit   Subjective:   [name] is a 82 y.o. year old very pleasant female patient who presents for/with See problem oriented charting Chief Complaint  Patient presents with   Hypertension    (No mask) Pt is here to discuss BP fluctuations.    Past Medical History-  Patient Active Problem List   Diagnosis Date Noted   CAD (coronary artery disease) s/p stent mCx 05/2019 06/02/2019    Priority: High   Grieving 02/18/2017    Priority: High   Osteoporosis 11/05/2016    Priority: High   Aortic atherosclerosis ([place]) 09/15/2020    Priority: Medium    Chest pain 12/29/2018    Priority: Medium    Primary open-angle glaucoma 11/20/2017    Priority: Medium    Hypothyroidism 07/01/2013    Priority: Medium    Hypertension 11/02/2012    Priority: Medium    Hyperlipidemia 07/24/2011    Priority: Medium    hx: breast cancer, IDC (Microinvasive) w DCIS, 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 - 07/10/2011    Priority: Medium    History of adenomatous polyp of colon 09/30/2018    Priority: Low   Medicare annual wellness visit, subsequent 09/26/2014    Priority: Low   Vitamin D deficiency 11/02/2012    Priority: Low   Obesity (BMI 30-39.9) 07/24/2011    Priority: Low   PAD (peripheral artery disease) ([place]) 09/15/2020   Statin myopathy 07/01/2019   Hypokalemia 06/02/2019   NSTEMI (non-ST elevated myocardial infarction) ([place]) 05/31/2019   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 Take once daily unless blood pressure elevated  above 150 then take twice daily 180 tablet 3   Ascorbic Acid (VITAMIN C PO) Take by mouth. 1 gummy daily     aspirin EC 81 MG tablet Take 81 mg by mouth daily.     Bempedoic Acid (NEXLETOL) 180 MG TABS Take 1 tablet by mouth daily. 90 tablet 2   Bimatoprost (LUMIGAN OP) Place 1 drop into both eyes at bedtime.      chlorthalidone (HYGROTON) 25 MG tablet Take 1 tablet (25 mg total) by mouth daily. Pt needs to keep upcoming appt in Dec for further refills 90 tablet 3   Cholecalciferol (VITAMIN D-3) 5000 UNITS TABS Take 5,000 mg by mouth daily.     dorzolamide-timolol (COSOPT) 22.3-6.8 MG/ML ophthalmic solution Place 1 drop into both eyes 2 (two) times daily.      Evolocumab (REPATHA SURECLICK) XX123456 MG/ML SOAJ INJECT CONTENTS OF 1 PEN ('140MG'$ ) INTO SKIN EVERY 14 DAYS. 2 mL 11   famotidine (PEPCID) 20 MG tablet TAKE 1 TABLET BY MOUTH TWICE DAILY. 180 tablet 3   icosapent Ethyl (VASCEPA) 1 g capsule Take 1 capsule (1 g total) by mouth 2 (two) times daily. 180 capsule 3   levothyroxine (SYNTHROID) 50 MCG tablet TAKE ONE TABLET BY MOUTH BEFORE BREAKFAST 90 tablet 3   metoprolol tartrate (LOPRESSOR) 25 MG tablet TAKE ONE TABLET BY MOUTH TWICE DAILY 180 tablet 0   nitroGLYCERIN (NITROSTAT) 0.4 MG SL tablet Place 1 tablet (0.4 mg total)  under the tongue every 5 (five) minutes x 3 doses as needed for chest pain. Pt needs to keep upcoming appt in Dec for further refills 25 tablet 0   pilocarpine (PILOCAR) 1 % ophthalmic solution Place 1 drop into both eyes 2 (two) times daily.      Polyethyl Glycol-Propyl Glycol (SYSTANE OP) Place 1-2 drops into both eyes as needed (for dry eyes).      potassium chloride (KLOR-CON) 10 MEQ tablet Take 2 tablets (20 mEq total) by mouth daily. 180 tablet 3   Probiotic Product (PROBIOTIC DAILY) CAPS Take 1 capsule by mouth daily. Reported on 10/10/2015     No current facility-administered medications for this visit.     Objective:  BP (!) 142/70   Pulse 72   Temp (!) 97.2 F  (36.2 C)   Ht '5\' 3"'$  (1.6 m)   Wt 150 lb (68 kg)   SpO2 95%   BMI 26.57 kg/m  Gen: NAD, resting comfortably CV: RRR no murmurs rubs or gallops Lungs: CTAB no crackles, wheeze, rhonchi Ext: trace edema Skin: warm, dry     Assessment and Plan   #hypertension S: medication: Metoprolol 25 mg twice daily, amlodipine 2.5 mg at night (with 2nd dose if needed for high BP over 150 given variability), chlorthalidone 25 mg with potassium daily Home readings #s: blood pressure was up to 182/101 last night- took her amlodipine a little early and blood pressure came down by 10:43 Pm to 120/70. This morning back up to 147/76 up to 181/95- was not feeling poorly other than low energy. She did take an extra 2.5 mg of amlodipine this AM - this is within a week of the anniversary of her husband passing -has seen more elevations since glaucoma treatments (had been on prednisolone but now off)- she feels stress is higher with having to still do other drops/remember them BP Readings from Last 3 Encounters:  11/05/22 (!) 142/70  09/19/22 127/67  08/13/22 138/82  A/P: was well controlled last visit and minimal elevation today- she has not been using her extra doses of amlodipine if over 150 (cardiology even said 160 to avoid lows with her variability) and I encouraged her to do so. Also has an anxiety element when checking pressures so want her to slow down frequency- she also sees me within 2-3 weeks and we can recheck at that time (she wanted to keep visit)  From avs "Blood pressure is slightly high today- we would prefer <140/90 but you have high variability so we do not want to overreact either -continue metoprolol 25 mg twice a day - continue chlorthalidone in the morning -continue amlodipine 2.5 mg in the evening  -if your blood pressure is above 150 on the top # you can take an extra half or whole amlodipine 2.5 mg  I would minimize checks since you have some anxiety with checks.   If your blood  pressure is over 200 with no symptoms and doesn't respond to amlodipine you can seek care in ED  If you have symptoms- seek care regardless- chest pain or shortness of breath or blurry vision or severe headaches"  #hypothyroidism S: compliant On thyroid medication-levothyroxine 50 mcg Lab Results  Component Value Date   TSH 1.608 07/11/2022   A/P:considered checking TSH today but has been well controlled and she wants to wait until next visit (could affect BP).    Recommended follow up: Return for next already scheduled visit or sooner if needed. Future Appointments  Date  Time Provider [place]  11/19/2022 10:00 AM Marin Olp, MD [facility]  08/01/2023  9:00 AM [facility] CO[facility]    Lab/Order associations:   ICD-10-CM   1. Primary hypertension  I10     2. Hypothyroidism, unspecified type  E03.9      No orders of the defined types were placed in this encounter.  Return precautions advised.  Garret Reddish, MD

## 2022-11-15 ENCOUNTER — Other Ambulatory Visit: Payer: Self-pay | Admitting: Family Medicine

## 2022-11-19 ENCOUNTER — Encounter: Payer: Self-pay | Admitting: Family Medicine

## 2022-11-19 ENCOUNTER — Ambulatory Visit: Payer: Medicare PPO | Admitting: Family Medicine

## 2022-11-19 VITALS — BP 123/56 | HR 89 | Temp 98.0°F | Ht 63.0 in | Wt 147.8 lb

## 2022-11-19 DIAGNOSIS — I10 Essential (primary) hypertension: Secondary | ICD-10-CM | POA: Diagnosis not present

## 2022-11-19 DIAGNOSIS — E559 Vitamin D deficiency, unspecified: Secondary | ICD-10-CM | POA: Diagnosis not present

## 2022-11-19 DIAGNOSIS — I700 Atherosclerosis of aorta: Secondary | ICD-10-CM | POA: Diagnosis not present

## 2022-11-19 DIAGNOSIS — E782 Mixed hyperlipidemia: Secondary | ICD-10-CM

## 2022-11-19 DIAGNOSIS — E039 Hypothyroidism, unspecified: Secondary | ICD-10-CM | POA: Diagnosis not present

## 2022-11-19 DIAGNOSIS — G720 Drug-induced myopathy: Secondary | ICD-10-CM | POA: Diagnosis not present

## 2022-11-19 DIAGNOSIS — F419 Anxiety disorder, unspecified: Secondary | ICD-10-CM | POA: Diagnosis not present

## 2022-11-19 DIAGNOSIS — I2510 Atherosclerotic heart disease of native coronary artery without angina pectoris: Secondary | ICD-10-CM

## 2022-11-19 LAB — COMPREHENSIVE METABOLIC PANEL
ALT: 15 U/L (ref 0–35)
AST: 18 U/L (ref 0–37)
Albumin: 4.4 g/dL (ref 3.5–5.2)
Alkaline Phosphatase: 39 U/L (ref 39–117)
BUN: 20 mg/dL (ref 6–23)
CO2: 31 mEq/L (ref 19–32)
Calcium: 10.1 mg/dL (ref 8.4–10.5)
Chloride: 99 mEq/L (ref 96–112)
Creatinine, Ser: 0.96 mg/dL (ref 0.40–1.20)
GFR: 55.57 mL/min — ABNORMAL LOW (ref >60.00)
Glucose, Bld: 92 mg/dL (ref 70–99)
Potassium: 3.1 mEq/L — ABNORMAL LOW (ref 3.5–5.1)
Sodium: 142 mEq/L (ref 135–145)
Total Bilirubin: 1 mg/dL (ref 0.2–1.2)
Total Protein: 6.7 g/dL (ref 6.0–8.3)

## 2022-11-19 LAB — VITAMIN D 25 HYDROXY (VIT D DEFICIENCY, FRACTURES): VITD: 50.55 ng/mL (ref 30.00–100.00)

## 2022-11-19 LAB — TSH: TSH: 1.66 u[IU]/mL (ref 0.35–5.50)

## 2022-11-19 NOTE — Progress Notes (Signed)
Phone [phone] In person visit   Subjective:   [name] is a 82 y.o. year old very pleasant female patient who presents for/with See problem oriented charting Chief Complaint  Patient presents with   Follow-up    Pt has no questions or concerns    Hypertension   Past Medical History-  Patient Active Problem List   Diagnosis Date Noted   CAD (coronary artery disease) s/p stent mCx 05/2019 06/02/2019    Priority: High   Grieving 02/18/2017    Priority: High   Osteoporosis 11/05/2016    Priority: High   Aortic atherosclerosis ([place]) 09/15/2020    Priority: Medium    Chest pain 12/29/2018    Priority: Medium    Primary open-angle glaucoma 11/20/2017    Priority: Medium    Hypothyroidism 07/01/2013    Priority: Medium    Hypertension 11/02/2012    Priority: Medium    Hyperlipidemia 07/24/2011    Priority: Medium    hx: breast cancer, IDC (Microinvasive) w DCIS, 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 Take once daily unless blood pressure elevated above 150 then take twice daily 180 tablet 3   Ascorbic Acid (VITAMIN C PO) Take by mouth. 1 gummy daily     aspirin EC 81 MG tablet Take 81 mg by mouth daily.     Bimatoprost (LUMIGAN OP) Place 1 drop into both eyes at bedtime.      chlorthalidone (HYGROTON) 25 MG tablet Take 1 tablet (25 mg total) by mouth daily. Pt needs to keep upcoming appt in Dec for further refills 90 tablet 3   Cholecalciferol (VITAMIN D-3) 5000 UNITS TABS Take 5,000 mg by  mouth daily.     dorzolamide-timolol (COSOPT) 22.3-6.8 MG/ML ophthalmic solution Place 1 drop into both eyes 2 (two) times daily.      Evolocumab (REPATHA SURECLICK) [id number] MG/ML SOAJ INJECT CONTENTS OF 1 PEN ('140MG'$ ) INTO SKIN EVERY 14 DAYS. 2 mL 11   famotidine (PEPCID) 20 MG tablet TAKE 1 TABLET BY MOUTH TWICE DAILY. 180 tablet 3   icosapent Ethyl (VASCEPA) 1 g capsule Take 1 capsule (1 g total) by mouth 2 (two) times daily. 180 capsule 3   levothyroxine (SYNTHROID) 50 MCG tablet TAKE ONE TABLET BY MOUTH BEFORE BREAKFAST 90 tablet 3   metoprolol tartrate (LOPRESSOR) 25 MG tablet TAKE ONE TABLET BY MOUTH TWICE DAILY 180 tablet 0   moxifloxacin (VIGAMOX) 0.5 % ophthalmic solution Place into the right eye.     NEXLETOL 180 MG TABS TAKE 1 TABLET EVERY DAY 90 tablet 3   nitroGLYCERIN (NITROSTAT) 0.4 MG SL tablet Place 1 tablet (0.4 mg total) under the tongue every 5 (five) minutes x 3 doses as needed for chest pain. Pt needs to keep upcoming appt in Dec for further refills 25 tablet 0   pilocarpine (PILOCAR) 1 % ophthalmic solution Place 1 drop into both eyes 2 (two) times daily.      Polyethyl Glycol-Propyl Glycol (SYSTANE OP)  Place 1-2 drops into both eyes as needed (for dry eyes).      potassium chloride (KLOR-CON) 10 MEQ tablet Take 2 tablets (20 mEq total) by mouth daily. 180 tablet 3   prednisoLONE acetate (PRED FORTE) 1 % ophthalmic suspension Place 1 drop into the right eye 4 (four) times daily.     Probiotic Product (PROBIOTIC DAILY) CAPS Take 1 capsule by mouth daily. Reported on 10/10/2015     No current facility-administered medications for this visit.     Objective:  BP (!) 123/56   Pulse 89   Temp 98 F (36.7 C) (Temporal)   Ht '5\' 3"'$  (1.6 m)   Wt 147 lb 12.8 oz (67 kg)   SpO2 97%   BMI 26.18 kg/m  Gen: NAD, resting comfortably CV: RRR no murmurs rubs or gallops Lungs: CTAB no crackles, wheeze, rhonchi Ext: no edema Skin: warm, dry     Assessment and Plan   #social  update- would be her husbands birthday today  #Anxiety/stress- ongoing issues since loss of husband years ago -has done an Media planner job with support groups and working with hospice over the years -we opted to refer to [facility]  #CAD-follows with Dr. Harrington Challenger on aspirin #PAD listed by Dr. Mable Fill does have aortic atherosclerosis #hyperlipidemia S: Medication: Repatha 140 mg every 2 weeks, nexletol/bempedoic acid '180mg'$  daily and Vascepa 1 g  (prior clear study- stopped mid 2022), aspirin 81 mg -no chest pain or shortness of breath  Lab Results  Component Value Date   CHOL 142 07/19/2022   HDL 64.20 07/19/2022   LDLCALC 58 07/19/2022   LDLDIRECT 54.0 06/01/2021   TRIG 98.0 07/19/2022   CHOLHDL 2 07/19/2022   A/P: CAD asymptomatic continue current medications  Aortic atherosclerosis (presumed stable)- LDL goal ideally <70 - has been at goal- not due for repeat yet today- continue current medications  #hypertension S: medication: Metoprolol 25 mg twice daily, amlodipine 2.5 mg (with 2nd dose if needed for high BP over 150-160 given variability), chlorthalidone 25 mg with potassium daily Home readings #s: has some variability but #s as low as 105/40  and she knows to take additional amlodipine if blood pressure goes above 15 or 160 which she does occasionally need. She also appropriately adjusts down and holds on dose if loewr like 123/56 last night -member at [facility] for [facility]- encouraged her to get back in routine  BP Readings from Last 3 Encounters:  11/19/22 (!) 123/56  11/05/22 (!) 142/70  09/19/22 127/67  A/P: stable on home readings but variable- we opted to continue current medicines   #hypothyroidism S: compliant On thyroid medication-levothyroxine 50 mcg Lab Results  Component Value Date   TSH 1.608 07/11/2022   A/P:hopefully stable- update tsh  today. Continue current meds for now   #Vitamin D deficiency S: Medication: 5000 units daily  Last vitamin  D Lab Results  Component Value Date   VD25OH 58.15 12/14/2021  A/P: hopefully stable- update vitamin D today. Continue current meds for now   Recommended follow up: Return in about 2 months (around 01/19/2023) for followup or sooner if needed.Schedule b4 you leave. Future Appointments  Date Time Provider [place]  03/07/2023  8:40 AM Fay Records, MD [facility]  08/01/2023  9:00 AM [facility] HEALTH CO[facility]   Lab/Order associations:   ICD-10-CM   1. Coronary artery disease involving native coronary artery of native heart without angina pectoris  I25.10     2. Aortic  atherosclerosis ([place])  I70.0     3. Mixed hyperlipidemia  E78.2     4. Primary hypertension  I10 Comprehensive metabolic panel    5. Hypothyroidism, unspecified type  E03.9 TSH    6. Drug-induced myopathy  G72.0     7. Anxiety  F41.9 Ambulatory referral to Psychology    8. Vitamin D deficiency  E55.9 VITAMIN D 25 Hydroxy (Vit-D Deficiency, Fractures)     Return precautions advised.  Garret Reddish, MD

## 2022-11-19 NOTE — Patient Instructions (Addendum)
Please call [phone] to schedule a visit with [facility] Dr. Theodis Shove - please tell the office you were directly referred by Dr. Yong Channel -will likely take time to get in but I think this is a great choice  Please stop by lab before you go If you have mychart- we will send your results within 3 business days of [place] receiving them.  If you do not have mychart- we will call you about results within 5 business days of [place] receiving them.  *please also note that you will see labs on mychart as soon as they post. I will later go in and write notes on them- will say "notes from Dr. Yong Channel"   Recommended follow up: Return in about 2 months (around 01/19/2023) for followup or sooner if needed.Schedule b4 you leave. Or 3 if you prefer

## 2022-11-25 ENCOUNTER — Encounter (HOSPITAL_BASED_OUTPATIENT_CLINIC_OR_DEPARTMENT_OTHER): Payer: Self-pay

## 2022-11-25 ENCOUNTER — Emergency Department (HOSPITAL_BASED_OUTPATIENT_CLINIC_OR_DEPARTMENT_OTHER)
Admission: EM | Admit: 2022-11-25 | Discharge: 2022-11-25 | Disposition: A | Discharge status: Home / Self Care | Payer: Medicare PPO | Attending: Emergency Medicine | Admitting: Emergency Medicine

## 2022-11-25 ENCOUNTER — Emergency Department (HOSPITAL_BASED_OUTPATIENT_CLINIC_OR_DEPARTMENT_OTHER): Payer: Medicare PPO

## 2022-11-25 DIAGNOSIS — R519 Headache, unspecified: Secondary | ICD-10-CM | POA: Diagnosis not present

## 2022-11-25 DIAGNOSIS — Z7982 Long term (current) use of aspirin: Secondary | ICD-10-CM | POA: Insufficient documentation

## 2022-11-25 DIAGNOSIS — R42 Dizziness and giddiness: Secondary | ICD-10-CM | POA: Diagnosis not present

## 2022-11-25 LAB — CBC WITH DIFFERENTIAL/PLATELET
Abs Immature Granulocytes: 0.03 10*3/uL (ref 0.00–0.07)
Basophils Absolute: 0.1 10*3/uL (ref 0.0–0.1)
Basophils Relative: 1 %
Eosinophils Absolute: 0.1 10*3/uL (ref 0.0–0.5)
Eosinophils Relative: 1 %
HCT: 43.6 % (ref 36.0–46.0)
Hemoglobin: 14.2 g/dL (ref 12.0–15.0)
Immature Granulocytes: 0 %
Lymphocytes Relative: 15 %
Lymphs Abs: 1.5 10*3/uL (ref 0.7–4.0)
MCH: 29.5 pg (ref 26.0–34.0)
MCHC: 32.6 g/dL (ref 30.0–36.0)
MCV: 90.5 fL (ref 80.0–100.0)
Monocytes Absolute: 0.6 10*3/uL (ref 0.1–1.0)
Monocytes Relative: 6 %
Neutro Abs: 7.5 10*3/uL (ref 1.7–7.7)
Neutrophils Relative %: 77 %
Platelets: 316 10*3/uL (ref 150–400)
RBC: 4.82 MIL/uL (ref 3.87–5.11)
RDW: 12.6 % (ref 11.5–15.5)
WBC: 9.8 10*3/uL (ref 4.0–10.5)
nRBC: 0 % (ref 0.0–0.2)

## 2022-11-25 LAB — BASIC METABOLIC PANEL
Anion gap: 9 (ref 5–15)
BUN: 13 mg/dL (ref 8–23)
CO2: 30 mmol/L (ref 22–32)
Calcium: 10 mg/dL (ref 8.9–10.3)
Chloride: 104 mmol/L (ref 98–111)
Creatinine, Ser: 0.84 mg/dL (ref 0.44–1.00)
GFR, Estimated: >60 mL/min (ref >60)
Glucose, Bld: 101 mg/dL — ABNORMAL HIGH (ref 70–99)
Potassium: 3.6 mmol/L (ref 3.5–5.1)
Sodium: 143 mmol/L (ref 135–145)

## 2022-11-25 NOTE — ED Provider Notes (Signed)
[facility] Provider Note   CSN: [id number] Arrival date & time: 11/25/22  1014     History  Chief Complaint  Patient presents with   Dizziness    [name] is a 82 y.o. female.  82 year old female presents with headache and dizziness for several days.  States her blood pressure has been going up periodically.  No associated cardiac symptoms with this.  Headache was several days ago and since that point has had just a mild ache to the left parietal occipital region.  Denies any emesis.  No new focal weakness.  Denies any ataxia.  Notes increased stress anxiety.  This was confirmed by her daughter who is at bedside.  Long discussion with family and this revolves around loss of her husband 5 years ago.  She is scheduled to start psychological therapy on May 1.          Home Medications Prior to Admission medications   Medication Sig Start Date End Date Taking? Authorizing Provider  amLODipine (NORVASC) 2.5 MG tablet Take 1-2 tablets (2.5-5 mg total) by mouth at bedtime. Take once daily unless blood pressure elevated above 150 then take twice daily 04/18/22   Marin Olp, MD  Ascorbic Acid (VITAMIN C PO) Take by mouth. 1 gummy daily    [provider]  aspirin EC 81 MG tablet Take 81 mg by mouth daily.    [provider]  Bimatoprost (LUMIGAN OP) Place 1 drop into both eyes at bedtime.     [provider]  chlorthalidone (HYGROTON) 25 MG tablet Take 1 tablet (25 mg total) by mouth daily. Pt needs to keep upcoming appt in Dec for further refills 08/13/22   Fay Records, MD  Cholecalciferol (VITAMIN D-3) 5000 UNITS TABS Take 5,000 mg by mouth daily.    [provider]  dorzolamide-timolol (COSOPT) 22.3-6.8 MG/ML ophthalmic solution Place 1 drop into both eyes 2 (two) times daily.     [provider]  Evolocumab (REPATHA SURECLICK) [id number] MG/ML SOAJ INJECT CONTENTS OF 1 PEN (140MG ) INTO SKIN  EVERY 14 DAYS. 02/06/22   Fay Records, MD  famotidine (PEPCID) 20 MG tablet TAKE 1 TABLET BY MOUTH TWICE DAILY. 05/25/22   Marin Olp, MD  icosapent Ethyl (VASCEPA) 1 g capsule Take 1 capsule (1 g total) by mouth 2 (two) times daily. 02/06/22   Fay Records, MD  levothyroxine (SYNTHROID) 50 MCG tablet TAKE ONE TABLET BY MOUTH BEFORE BREAKFAST 04/18/22   Marin Olp, MD  metoprolol tartrate (LOPRESSOR) 25 MG tablet TAKE ONE TABLET BY MOUTH TWICE DAILY 11/15/22   Marin Olp, MD  moxifloxacin (VIGAMOX) 0.5 % ophthalmic solution Place into the right eye. 09/28/22   [provider]  NEXLETOL 180 MG TABS TAKE 1 TABLET EVERY DAY 11/15/22   Marin Olp, MD  nitroGLYCERIN (NITROSTAT) 0.4 MG SL tablet Place 1 tablet (0.4 mg total) under the tongue every 5 (five) minutes x 3 doses as needed for chest pain. Pt needs to keep upcoming appt in Dec for further refills 07/20/22   Fay Records, MD  pilocarpine [facility]) 1 % ophthalmic solution Place 1 drop into both eyes 2 (two) times daily.     [provider]  Polyethyl Glycol-Propyl Glycol (SYSTANE OP) Place 1-2 drops into both eyes as needed (for dry eyes).     [provider]  potassium chloride (KLOR-CON) 10 MEQ tablet Take 2 tablets (20 mEq total)  by mouth daily. 09/25/22   Marin Olp, MD  prednisoLONE acetate (PRED FORTE) 1 % ophthalmic suspension Place 1 drop into the right eye 4 (four) times daily. 10/02/22   [provider]  Probiotic Product (PROBIOTIC DAILY) CAPS Take 1 capsule by mouth daily. Reported on 10/10/2015    [provider]      Allergies    Adhesive [tape] and Rosuvastatin    Review of Systems   Review of Systems  All other systems reviewed and are negative.   Physical Exam Updated Vital Signs BP (!) 165/66 (BP Location: Right Arm)   Pulse 80   Temp 97.6 F (36.4 C) (Oral)   Resp 16   SpO2 95%  Physical Exam Vitals and nursing note reviewed.  Constitutional:       General: She is not in acute distress.    Appearance: Normal appearance. She is well-developed. She is not toxic-appearing.  HENT:     Head: Normocephalic and atraumatic.  Eyes:     General: Lids are normal.     Conjunctiva/sclera: Conjunctivae normal.     Pupils: Pupils are equal, round, and reactive to light.  Neck:     Thyroid: No thyroid mass.     Trachea: No tracheal deviation.  Cardiovascular:     Rate and Rhythm: Normal rate and regular rhythm.     Heart sounds: Normal heart sounds. No murmur heard.    No gallop.  Pulmonary:     Effort: Pulmonary effort is normal. No respiratory distress.     Breath sounds: Normal breath sounds. No stridor. No decreased breath sounds, wheezing, rhonchi or rales.  Abdominal:     General: There is no distension.     Palpations: Abdomen is soft.     Tenderness: There is no abdominal tenderness. There is no rebound.  Musculoskeletal:        General: No tenderness. Normal range of motion.     Cervical back: Normal range of motion and neck supple.  Skin:    General: Skin is warm and dry.     Findings: No abrasion or rash.  Neurological:     General: No focal deficit present.     Mental Status: She is alert and oriented to person, place, and time. Mental status is at baseline.     GCS: GCS eye subscore is 4. GCS verbal subscore is 5. GCS motor subscore is 6.     Cranial Nerves: No cranial nerve deficit.     Sensory: No sensory deficit.     Motor: Motor function is intact.  Psychiatric:        Attention and Perception: Attention normal.        Speech: Speech normal.        Behavior: Behavior normal.     ED Results / Procedures / Treatments   Labs (all labs ordered are listed, but only abnormal results are displayed) Labs Reviewed  CBC WITH DIFFERENTIAL/PLATELET  BASIC METABOLIC PANEL    EKG EKG Interpretation  Date/Time:  Sunday November 25 2022 11:34:30 EDT Ventricular Rate:  73 PR Interval:  157 QRS Duration: 99 QT  Interval:  381 QTC Calculation: 420 R Axis:   16 Text Interpretation: Sinus rhythm Confirmed by Lacretia Leigh ([id number]) on 11/25/2022 12:36:11 PM  Radiology No results found.  Procedures Procedures    Medications Ordered in ED Medications - No data to display  ED Course/ Medical Decision Making/ A&P  Medical Decision Making Amount and/or Complexity of Data Reviewed Labs: ordered. Radiology: ordered. ECG/medicine tests: ordered.   Patient is EKG shows normal sinus rhythm.  No signs of acute ischemic changes here.  Patient complained of having headache and head CT per my interpretation does show any acute findings.  Low suspicion for ACS or acute cerebral hemorrhage.  Blood pressure is stable here.  Renal function is normal.  Suspect that her anxiety is driving her blood pressure.  She has reported to see a therapist on May 1.  Informed patient and daughter who is at bedside to follow-up with their doctor tomorrow for possible blood pressure medication adjustment.        Final Clinical Impression(s) / ED Diagnoses Final diagnoses:  None    Rx / DC Orders ED Discharge Orders     None         Lacretia Leigh, MD 11/25/22 1245

## 2022-11-25 NOTE — ED Triage Notes (Signed)
She states she "had a bad headache last Wednesday" which improved on its own. She is here today with c/o occasional "dizziness" and a very mild h/a at left parietal area. She is awake, alert and oriented x 4 with clear speech.

## 2022-11-26 ENCOUNTER — Telehealth: Payer: Self-pay | Admitting: Family Medicine

## 2022-11-26 NOTE — Telephone Encounter (Signed)
They thought anxiety was a big role for visit so glad she has visit with Dr. Theodis Shove. I am out of office next week just as a heads up- is she wanting to be seen or does she feel ok waiting to see Dr. Theodis Shove?

## 2022-11-26 NOTE — Telephone Encounter (Signed)
Patient states she went to ED 11/25/22 for high blood pressure, head ache.

## 2022-11-26 NOTE — Telephone Encounter (Signed)
Patient states she wants Dr. Yong Channel know that she immediately scheduled appointment with Dr.Winstead (Jan 09, 2023)

## 2022-11-26 NOTE — Telephone Encounter (Signed)
FYI

## 2022-11-26 NOTE — Telephone Encounter (Signed)
See if pt is wanting to schedule with Dr. Theodis Shove or Dr. Yong Channel and schedule with who she prefers.

## 2022-11-27 ENCOUNTER — Ambulatory Visit: Payer: Medicare PPO | Admitting: Family Medicine

## 2022-11-27 ENCOUNTER — Encounter: Payer: Self-pay | Admitting: Family Medicine

## 2022-11-27 VITALS — BP 130/72 | HR 70 | Temp 97.8°F | Ht 63.0 in | Wt 149.8 lb

## 2022-11-27 DIAGNOSIS — F419 Anxiety disorder, unspecified: Secondary | ICD-10-CM | POA: Diagnosis not present

## 2022-11-27 DIAGNOSIS — I10 Essential (primary) hypertension: Secondary | ICD-10-CM | POA: Diagnosis not present

## 2022-11-27 DIAGNOSIS — E039 Hypothyroidism, unspecified: Secondary | ICD-10-CM

## 2022-11-27 NOTE — Telephone Encounter (Signed)
Apt set for 3/19 at 1120 am with dr hunter

## 2022-11-27 NOTE — Progress Notes (Signed)
Phone [phone] In person visit   Subjective:   [name] is a 82 y.o. year old very pleasant female patient who presents for/with See problem oriented charting Chief Complaint  Patient presents with   Anxiety    Pt states she is feeling overwhelmed with things and the 5 year mark of losing her husband.   Past Medical History-  Patient Active Problem List   Diagnosis Date Noted   CAD (coronary artery disease) s/p stent mCx 05/2019 06/02/2019    Priority: High   Grieving 02/18/2017    Priority: High   Osteoporosis 11/05/2016    Priority: High   Anxiety 11/27/2022    Priority: Medium    Aortic atherosclerosis ([place]) 09/15/2020    Priority: Medium    Chest pain 12/29/2018    Priority: Medium    Primary open-angle glaucoma 11/20/2017    Priority: Medium    Hypothyroidism 07/01/2013    Priority: Medium    Hypertension 11/02/2012    Priority: Medium    Hyperlipidemia 07/24/2011    Priority: Medium    hx: breast cancer, IDC (Microinvasive) w DCIS, 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 Take once daily unless blood pressure elevated above 150 then take twice daily 180 tablet 3   Ascorbic Acid (VITAMIN C PO) Take by mouth. 1 gummy daily     aspirin EC 81 MG tablet Take 81 mg by mouth daily.     Bimatoprost (LUMIGAN OP) Place 1 drop into both eyes at bedtime.      chlorthalidone (HYGROTON) 25 MG tablet Take 1 tablet (25 mg total) by mouth daily. Pt needs to keep upcoming appt in Dec for further  refills 90 tablet 3   Cholecalciferol (VITAMIN D-3) 5000 UNITS TABS Take 5,000 mg by mouth daily.     dorzolamide-timolol (COSOPT) 22.3-6.8 MG/ML ophthalmic solution Place 1 drop into both eyes 2 (two) times daily.      Evolocumab (REPATHA SURECLICK) [id number] MG/ML SOAJ INJECT CONTENTS OF 1 PEN (140MG ) INTO SKIN EVERY 14 DAYS. 2 mL 11   famotidine (PEPCID) 20 MG tablet TAKE 1 TABLET BY MOUTH TWICE DAILY. 180 tablet 3   icosapent Ethyl (VASCEPA) 1 g capsule Take 1 capsule (1 g total) by mouth 2 (two) times daily. 180 capsule 3   levothyroxine (SYNTHROID) 50 MCG tablet TAKE ONE TABLET BY MOUTH BEFORE BREAKFAST 90 tablet 3   metoprolol tartrate (LOPRESSOR) 25 MG tablet TAKE ONE TABLET BY MOUTH TWICE DAILY 180 tablet 0   moxifloxacin (VIGAMOX) 0.5 % ophthalmic solution Place into the right eye.     NEXLETOL 180 MG TABS TAKE 1 TABLET EVERY DAY 90 tablet 3   nitroGLYCERIN (NITROSTAT) 0.4 MG SL tablet Place 1 tablet (0.4 mg total) under the tongue every 5 (five) minutes x 3 doses as needed for chest pain. Pt needs to keep upcoming appt in Dec for further refills 25 tablet 0   pilocarpine (PILOCAR) 1 % ophthalmic solution Place 1 drop  into both eyes 2 (two) times daily.      Polyethyl Glycol-Propyl Glycol (SYSTANE OP) Place 1-2 drops into both eyes as needed (for dry eyes).      potassium chloride (KLOR-CON) 10 MEQ tablet Take 2 tablets (20 mEq total) by mouth daily. 180 tablet 3   prednisoLONE acetate (PRED FORTE) 1 % ophthalmic suspension Place 1 drop into the right eye 4 (four) times daily.     Probiotic Product (PROBIOTIC DAILY) CAPS Take 1 capsule by mouth daily. Reported on 10/10/2015     No current facility-administered medications for this visit.     Objective:  BP 130/72   Pulse 70   Temp 97.8 F (36.6 C)   Ht 5\' 3"  (1.6 m)   Wt 149 lb 12.8 oz (67.9 kg)   SpO2 97%   BMI 26.54 kg/m     Assessment and Plan   # Anxiety S:Medication: none  Counseling: has planned visit upcoming with Dr.  Theodis Shove on may 1st   Feels anxiety has worked at 5 year mark of losing husband. Recent ED trip related to this- sees more blood pressure fluctuations with anxiety. She had to put her blood pressure cuff away- was making her too anxious.  She had a reassuring head CT given new onset headache.  CBC and BMP largely reassuring-EKG without significant changes-thought unlikely to be cardiac feels never really got to grieve - was so busy doing all required tasks as he did so much for her. She never really knew he was as ill as he was and she had a hard time losing him so quickly. He feels he was trying to protect her but ended up being hard on her.     11/27/2022   11:47 AM  GAD 7 : Generalized Anxiety Score  Nervous, Anxious, on Edge 2  Control/stop worrying 2  Worry too much - different things 2  Trouble relaxing 0  Restless 0  Easily annoyed or irritable 0  Afraid - awful might happen 1  Total GAD 7 Score 7  Anxiety Difficulty Not difficult at all   A/P: Patient with ongoing anxiety issues after loss of husband 5 years ago that seem to have flared recently after 5-year anniversary of loss - We discussed potentially starting medication but she wants to hold off at this time -She wants to reduce frequency of checking her blood pressure as she feel like this has been a large stressor especially with her history of variability - Has visit with psychology on May 1 and sees me on May 13-she would like to keep that schedule   #hypertension with variable blood pressures  S: medication: Metoprolol 25 mg twice daily, amlodipine 2.5 mg (with 2nd dose if needed for high BP over 150 given variability)-has decided to take half tablet in am mostly, chlorthalidone 25 mg with potassium daily BP Readings from Last 3 Encounters:  11/27/22 130/72  11/25/22 (!) 142/66  11/19/22 (!) 123/56  A/P: Blood pressure reasonably well-controlled for her age and given underlying variability.  Frequent blood pressure checks  have become stressful for her and seem to be driving blood pressure up-we opted to have her discontinue home checks at this time-she will continue metoprolol 25 mg twice daily, amlodipine half tablet or 1.25 mg in the morning with 2.5 mg before bed and chlorthalidone daily-if she has concern for low blood pressure such as feeling lightheaded or dizzy can certainly check blood pressure and let me know if running on the lower  side  #hypothyroidism S: compliant On thyroid medication-levothyroxine 50 mcg Lab Results  Component Value Date   TSH 1.66 11/19/2022  A/P: Thyroid has been well-controlled-do not think this is contributing to her anxiety  Recommended follow up: Return for next already scheduled visit or sooner if needed. Future Appointments  Date Time Provider [place]  01/09/2023  9:00 AM Donnetta Hutching, LMFT [facility] None  01/21/2023 10:20 AM Marin Olp, MD [facility]  03/07/2023  8:40 AM Fay Records, MD [facility]  08/01/2023  9:00 AM [facility] ANNUAL WELLNESS VISIT 1 [facility]    Lab/Order associations:   ICD-10-CM   1. Anxiety  F41.9     2. Primary hypertension  I10     3. Hypothyroidism, unspecified type  E03.9      Return precautions advised.  Garret Reddish, MD

## 2022-11-27 NOTE — Patient Instructions (Addendum)
Blood pressure looks great- I'm ok with you slowing down on home checks  You wanted to hold off on medicine for now for anxiety but we can certainly reconsider depending on how things go with Dr. Theodis Shove  Recommended follow up: Return for next already scheduled visit or sooner if needed.

## 2022-11-29 ENCOUNTER — Telehealth: Payer: Self-pay

## 2022-11-29 NOTE — Telephone Encounter (Signed)
     Patient  visit on 11/25/2022  at [facility] was for dizziness.  Have you been able to follow up with your primary care physician? Yes  The patient was or was not able to obtain any needed medicine or equipment. No medication prescribed.  Are there diet recommendations that you are having difficulty following? No  Patient expresses understanding of discharge instructions and education provided has no other needs at this time. Yes   [facility] Resource Care Guide   [email or web address]  ?? [phone]   Website: [email or web address]  [email or web address]

## 2022-12-21 ENCOUNTER — Encounter (HOSPITAL_BASED_OUTPATIENT_CLINIC_OR_DEPARTMENT_OTHER): Payer: Self-pay | Admitting: Student

## 2022-12-21 ENCOUNTER — Ambulatory Visit (INDEPENDENT_AMBULATORY_CARE_PROVIDER_SITE_OTHER): Payer: Medicare PPO | Admitting: Student

## 2022-12-21 DIAGNOSIS — M25551 Pain in right hip: Secondary | ICD-10-CM | POA: Diagnosis not present

## 2022-12-21 MED ORDER — TRIAMCINOLONE ACETONIDE 40 MG/ML IJ SUSP
2.0000 mL | INTRAMUSCULAR | Status: AC | PRN
  Administered 2022-12-21: 2 mL via INTRA_ARTICULAR

## 2022-12-21 MED ORDER — LIDOCAINE HCL 1 % IJ SOLN
4.0000 mL | INTRAMUSCULAR | Status: AC | PRN
  Administered 2022-12-21: 4 mL

## 2022-12-21 NOTE — Progress Notes (Signed)
Chief Complaint: Right hip pain     History of Present Illness:   [name] is a 82 y.o. female presenting for evaluation of lateral right hip pain.  She states that this has been going on for about a week.  It began after some increased activity in which the patient was helping a friend with transportation requiring her to load and unload a walker from her car multiple times.  She has tried some Tylenol for the pain but otherwise no other treatment.  She does state that it has been improving some recently however it tends to bother her most when walking.   Surgical History:   None  PMH/PSH/Family History/Social History/Meds/Allergies:    Past Medical History:  Diagnosis Date   Abnormal thyroid function test 07/01/2013   Anxiety    Arthritis    Cancer    Coronary artery disease    Dermatitis 11/25/2013   Glaucoma 11/02/2012   HTN (hypertension) 11/02/2012   hx: breast cancer, IDC (Microinvasive) w DCIS, 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 - 07/10/2011   Hyperlipidemia    Hypothyroid    Obesity    Past Surgical History:  Procedure Laterality Date   AUGMENTATION MAMMAPLASTY Left    05/2006   BREAST SURGERY  10/2005   Left Mastectomy, reconstruction, reduction on right. Dr. Odis Luster   CARDIAC CATHETERIZATION     COLONOSCOPY     CORONARY STENT INTERVENTION N/A 06/01/2019   Procedure: CORONARY STENT INTERVENTION;  Surgeon: Tonny Bollman, MD;  Location: [facility] INVASIVE CV LAB;  Service: Cardiovascular;  Laterality: N/A;   CRYOTHERAPY     GYN for CIN III   DILATION AND CURETTAGE OF UTERUS     EXCISION MORTON'S NEUROMA     right foot   EYE SURGERY Right 05/08/2018   LEFT HEART CATH AND CORONARY ANGIOGRAPHY N/A 06/01/2019   Procedure: LEFT HEART CATH AND CORONARY ANGIOGRAPHY;  Surgeon: Tonny Bollman, MD;  Location: [facility] INVASIVE CV LAB;  Service: Cardiovascular;  Laterality: N/A;   MASTECTOMY     REDUCTION MAMMAPLASTY Right    Social History   Socioeconomic  History   Marital status: Widowed    Spouse name: Not on file   Number of children: 1   Years of education: Not on file   Highest education level: Not on file  Occupational History   Occupation: retired  Tobacco Use   Smoking status: Never    Passive exposure: Never   Smokeless tobacco: Never  Vaping Use   Vaping Use: Never used  Substance and Sexual Activity   Alcohol use: Yes    Comment: Rare   Drug use: No   Sexual activity: Not on file  Other Topics Concern   Not on file  Social History Narrative   Widowed 2019- lives alone. Was  Married 1981. 1 daughter, 2 step sons. 4 grandchildren      Midwife      Hobbies: going out to eat, activities with church including cancer support group at church   Social Determinants of Health   Financial Resource Strain: Low Risk  (07/19/2022)   Overall Financial Resource Strain (CARDIA)    Difficulty of Paying Living Expenses: Not hard at all  Food Insecurity: No Food Insecurity (09/13/2022)   Hunger Vital Sign    Worried About Running Out of Food  in the Last Year: Never true    Ran Out of Food in the Last Year: Never true  Transportation Needs: No Transportation Needs (09/13/2022)   PRAPARE - Administrator, Civil Service (Medical): No    Lack of Transportation (Non-Medical): No  Physical Activity: Inactive (07/19/2022)   Exercise Vital Sign    Days of Exercise per Week: 0 days    Minutes of Exercise per Session: 0 min  Stress: No Stress Concern Present (07/19/2022)   [employer] of Occupational Health - Occupational Stress Questionnaire    Feeling of Stress : Only a little  Social Connections: Moderately Integrated (07/19/2022)   Social Connection and Isolation Panel [NHANES]    Frequency of Communication with Friends and Family: More than three times a week    Frequency of Social Gatherings with Friends and Family: Three times a week    Attends Religious Services: More than 4 times per year    Active  Member of Clubs or Organizations: Yes    Attends Banker Meetings: 1 to 4 times per year    Marital Status: Widowed   Family History  Problem Relation Age of Onset   Pneumonia Mother    Hyperlipidemia Mother    Parkinsonism Mother    CAD Mother    Other Father        bilateral subdural hematoma   Colon cancer Paternal Grandmother    Hyperlipidemia Sister    Prostate cancer Neg Hx    Breast cancer Neg Hx    Heart disease Neg Hx    Diabetes Neg Hx    Allergies  Allergen Reactions   Adhesive [Tape] Other (See Comments)    Causes skin to turn red where touched   Rosuvastatin Other (See Comments)    Joint & muscle aches, sleep issues.   Current Outpatient Medications  Medication Sig Dispense Refill   amLODipine (NORVASC) 2.5 MG tablet Take 1-2 tablets (2.5-5 mg total) by mouth at bedtime. Take once daily unless blood pressure elevated above 150 then take twice daily 180 tablet 3   Ascorbic Acid (VITAMIN C PO) Take by mouth. 1 gummy daily     aspirin EC 81 MG tablet Take 81 mg by mouth daily.     Bimatoprost (LUMIGAN OP) Place 1 drop into both eyes at bedtime.      chlorthalidone (HYGROTON) 25 MG tablet Take 1 tablet (25 mg total) by mouth daily. Pt needs to keep upcoming appt in Dec for further refills 90 tablet 3   Cholecalciferol (VITAMIN D-3) 5000 UNITS TABS Take 5,000 mg by mouth daily.     dorzolamide-timolol (COSOPT) 22.3-6.8 MG/ML ophthalmic solution Place 1 drop into both eyes 2 (two) times daily.      Evolocumab (REPATHA SURECLICK) 140 MG/ML SOAJ INJECT CONTENTS OF 1 PEN (140MG ) INTO SKIN EVERY 14 DAYS. 2 mL 11   famotidine (PEPCID) 20 MG tablet TAKE 1 TABLET BY MOUTH TWICE DAILY. 180 tablet 3   icosapent Ethyl (VASCEPA) 1 g capsule Take 1 capsule (1 g total) by mouth 2 (two) times daily. 180 capsule 3   levothyroxine (SYNTHROID) 50 MCG tablet TAKE ONE TABLET BY MOUTH BEFORE BREAKFAST 90 tablet 3   metoprolol tartrate (LOPRESSOR) 25 MG tablet TAKE ONE TABLET BY  MOUTH TWICE DAILY 180 tablet 0   moxifloxacin (VIGAMOX) 0.5 % ophthalmic solution Place into the right eye.     NEXLETOL 180 MG TABS TAKE 1 TABLET EVERY DAY 90 tablet 3   nitroGLYCERIN (  NITROSTAT) 0.4 MG SL tablet Place 1 tablet (0.4 mg total) under the tongue every 5 (five) minutes x 3 doses as needed for chest pain. Pt needs to keep upcoming appt in Dec for further refills 25 tablet 0   pilocarpine (PILOCAR) 1 % ophthalmic solution Place 1 drop into both eyes 2 (two) times daily.      Polyethyl Glycol-Propyl Glycol (SYSTANE OP) Place 1-2 drops into both eyes as needed (for dry eyes).      potassium chloride (KLOR-CON) 10 MEQ tablet Take 2 tablets (20 mEq total) by mouth daily. 180 tablet 3   prednisoLONE acetate (PRED FORTE) 1 % ophthalmic suspension Place 1 drop into the right eye 4 (four) times daily.     Probiotic Product (PROBIOTIC DAILY) CAPS Take 1 capsule by mouth daily. Reported on 10/10/2015     No current facility-administered medications for this visit.   No results found.  Review of Systems:   A ROS was performed including pertinent positives and negatives as documented in the HPI.  Physical Exam :   Constitutional: NAD and appears stated age Neurological: Alert and oriented Psych: Appropriate affect and cooperative There were no vitals taken for this visit.   Comprehensive Musculoskeletal Exam:    Patient has pinpoint tenderness just posterior to the greater trochanter of the right hip.  Patient has slight antalgic gait.  Passive range of motion of the hip still 120 degrees flexion and 20 degrees internal/external rotation with no pain.  Hip flexion and extension strength 5/5.  Flexion and extension of the knee 5/5 strength with no pain.  Distal neurosensory exam intact.  Imaging:    I personally reviewed and interpreted the radiographs.   Assessment:   82 y.o. female presenting with new onset lateral right hip pain.  I do suspect this to be a trochanteric bursitis due  to her location of tenderness with palpation however unable to rule out a gluteal tendinopathy particularly due to her symptoms while walking.  I have recommended an ultrasound-guided trochanteric cortisone injection and after discussion with the patient she feels that it has been symptomatic enough and she wants to proceed.  This was performed in clinic and patient tolerated the procedure well.  Will plan to see her back in clinic as needed if symptoms recur.  Plan :    -Return to clinic as needed    Procedure Note  Patient: [name]             Date of Birth: 10-Dec-1940           MRN: [id number]             Visit Date: 12/21/2022  Procedures: Visit Diagnoses:  1. Pain in right hip     Large Joint Inj: R greater trochanter on 12/21/2022 4:39 PM Indications: pain Details: 22 G 1.5 in needle, ultrasound-guided lateral approach Medications: 4 mL lidocaine 1 %; 2 mL triamcinolone acetonide 40 MG/ML Outcome: tolerated well, no immediate complications      I personally saw and evaluated the patient, and participated in the management and treatment plan.  Hazle Nordmann, PA-C Orthopedics  This document was dictated using Conservation officer, historic buildings. A reasonable attempt at proof reading has been made to minimize errors.

## 2023-01-09 ENCOUNTER — Ambulatory Visit (INDEPENDENT_AMBULATORY_CARE_PROVIDER_SITE_OTHER): Payer: Medicare PPO | Admitting: Behavioral Health

## 2023-01-09 DIAGNOSIS — F419 Anxiety disorder, unspecified: Secondary | ICD-10-CM

## 2023-01-09 DIAGNOSIS — F4321 Adjustment disorder with depressed mood: Secondary | ICD-10-CM

## 2023-01-09 NOTE — Progress Notes (Unsigned)
                Nox Talent L Danaja [place], LMFT 

## 2023-01-09 NOTE — Progress Notes (Signed)
[facility] Counselor Initial Adult Exam  Name: [name] Date: 01/09/2023 MRN: [id number] DOB: 1941/08/06 PCP: Shelva Majestic, MD  Time spent: 60 min In Person @ [facility]  Guardian/Payee:  [employer]    Paperwork requested: No   Reason for Visit /Presenting Problem: Pt lost her [name] in 2019 & has been suffering grief/anx/dep since that time. She is here today hoping to talk w/someone who can assist her to process her grief & deal w/being alone for the first time in her life. Pt is lonely in her big home by herself & keeps herself active & engaged in her [employer]. Her Dtr [name] has recently told her she sees Pt feeling a bit better lately. Pt endorses this also & agrees to a monthly visit w/Clinician.  Mental Status Exam: Appearance:   Neat and Well Groomed     Behavior:  Appropriate and Sharing  Motor:  Normal  Speech/Language:   Clear and Coherent and Normal Rate  Affect:  Appropriate  Mood:  normal  Thought process:  normal  Thought content:    WNL  Sensory/Perceptual disturbances:    WNL  Orientation:  oriented to person, place, and time/date  Attention:  Good  Concentration:  Good  Memory:  WNL  Fund of knowledge:   Good  Insight:    Good  Judgment:   Good  Impulse Control:  Good    Risk Assessment: Danger to Self:  No Self-injurious Behavior: No Danger to Others: No Duty to Warn:no Physical Aggression / Violence:No  Access to Firearms a concern: No  Gang Involvement:No  Patient / guardian was educated about steps to take if suicide or homicide risk level increases between visits: n/a While future psychiatric events cannot be accurately predicted, the patient does not currently require acute inpatient psychiatric care and does not currently meet [employer] involuntary commitment criteria.  Substance Abuse History: Current substance abuse: No     Past Psychiatric History:   No previous psychological  problems have been observed Outpatient Providers: Dr. Tana Conch, MD History of Psych Hospitalization: No  Psychological Testing:  NA    Abuse History:  Victim of: No.,  NA    Report needed: No. Victim of Neglect:No. Perpetrator of  NA   Witness / Exposure to Domestic Violence: No   Protective Services Involvement: No  Witness to [employer] Violence:  No   Family History:  Family History  Problem Relation Age of Onset   Pneumonia Mother    Hyperlipidemia Mother    Parkinsonism Mother    CAD Mother    Other Father        bilateral subdural hematoma   Colon cancer Paternal Grandmother    Hyperlipidemia Sister    Prostate cancer Neg Hx    Breast cancer Neg Hx    Heart disease Neg Hx    Diabetes Neg Hx     Living situation: the patient lives alone  Sexual Orientation: Straight  Relationship Status: widowed  Name of spouse / other: [name] If a parent, number of children / ages:55yo Dtr [name] ([place]) who is married w/[employer] who attend [employer], 53yo Son [name] who is married & has 2 Dtrs Astronomer), & 54yo Son [name] who is married w/no kids  Support Systems: friends  Surveyor, quantity Stress:  No   Income/Employment/Disability: 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, and resiliency. 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 She is a very thoughtful Karma Greaser who enjoys ppl & does not want to disappoint. She knows what she  needs mentally & believes in mental health Tx.  Barriers:  None noted today   Legal History: Pending legal issue / charges: The patient has no significant history of legal issues. History of legal issue / charges:  NA  Medical History/Surgical History: reviewed Past Medical History:  Diagnosis Date   Abnormal thyroid function test 07/01/2013   Anxiety    Arthritis    Cancer (HCC)    Coronary artery disease    Dermatitis 11/25/2013   Glaucoma 11/02/2012   HTN (hypertension) 11/02/2012   hx: breast cancer, IDC (Microinvasive) w DCIS, 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 - 07/10/2011   Hyperlipidemia    Hypothyroid    Obesity     Past Surgical History:  Procedure Laterality Date   AUGMENTATION MAMMAPLASTY Left    05/2006   BREAST SURGERY  10/2005   Left Mastectomy, reconstruction, reduction on right. Dr. Odis Luster   CARDIAC CATHETERIZATION     COLONOSCOPY     CORONARY STENT INTERVENTION N/A 06/01/2019   Procedure: CORONARY STENT INTERVENTION;  Surgeon: Tonny Bollman, MD;  Location: [facility] INVASIVE CV LAB;  Service: Cardiovascular;  Laterality: N/A;   CRYOTHERAPY     GYN for CIN III   DILATION AND CURETTAGE OF UTERUS     EXCISION MORTON'S NEUROMA     right foot   EYE SURGERY Right 05/08/2018   LEFT HEART CATH AND CORONARY ANGIOGRAPHY N/A 06/01/2019   Procedure: LEFT HEART CATH AND CORONARY ANGIOGRAPHY;  Surgeon: Tonny Bollman, MD;  Location: [facility] INVASIVE CV LAB;  Service: Cardiovascular;  Laterality: N/A;   MASTECTOMY     REDUCTION MAMMAPLASTY Right     Medications: Current Outpatient Medications  Medication Sig Dispense Refill   amLODipine (NORVASC) 2.5 MG tablet Take 1-2 tablets (2.5-5 mg total) by mouth at bedtime. Take once daily unless blood pressure elevated above 150 then take twice daily 180 tablet 3   Ascorbic Acid (VITAMIN C PO) Take by mouth. 1 gummy daily     aspirin EC 81 MG tablet Take 81 mg by mouth daily.     Bimatoprost (LUMIGAN OP) Place 1 drop into both eyes at bedtime.       chlorthalidone (HYGROTON) 25 MG tablet Take 1 tablet (25 mg total) by mouth daily. Pt needs to keep upcoming appt in Dec for further refills 90 tablet 3   Cholecalciferol (VITAMIN D-3) 5000 UNITS TABS Take 5,000 mg by mouth daily.     dorzolamide-timolol (COSOPT) 22.3-6.8 MG/ML ophthalmic solution Place 1 drop into both eyes 2 (two) times daily.      Evolocumab (REPATHA SURECLICK) 140 MG/ML SOAJ INJECT CONTENTS OF 1 PEN (140MG ) INTO SKIN EVERY 14 DAYS. 2 mL 11   famotidine (PEPCID) 20 MG tablet TAKE 1 TABLET BY MOUTH TWICE DAILY. 180 tablet 3   icosapent Ethyl (VASCEPA) 1 g capsule Take 1 capsule (1 g total) by mouth 2 (two) times daily. 180 capsule 3   levothyroxine (SYNTHROID) 50 MCG  tablet TAKE ONE TABLET BY MOUTH BEFORE BREAKFAST 90 tablet 3   metoprolol tartrate (LOPRESSOR) 25 MG tablet TAKE ONE TABLET BY MOUTH TWICE DAILY 180 tablet 0   moxifloxacin (VIGAMOX) 0.5 % ophthalmic solution Place into the right eye.     NEXLETOL 180 MG TABS TAKE 1 TABLET EVERY DAY 90 tablet 3   nitroGLYCERIN (NITROSTAT) 0.4 MG SL tablet Place 1 tablet (0.4 mg total) under the tongue every 5 (five) minutes x 3 doses as needed for chest pain. Pt needs to keep upcoming appt in Dec for further refills 25 tablet 0   pilocarpine (PILOCAR) 1 % ophthalmic solution Place 1 drop into both eyes 2 (two) times daily.      Polyethyl Glycol-Propyl Glycol (SYSTANE OP) Place 1-2 drops into both eyes as needed (for dry eyes).      potassium chloride (KLOR-CON) 10 MEQ tablet Take 2 tablets (20 mEq total) by mouth daily. 180 tablet 3   prednisoLONE acetate (PRED FORTE) 1 % ophthalmic suspension Place 1 drop into the right eye 4 (four) times daily.     Probiotic Product (PROBIOTIC DAILY) CAPS Take 1 capsule by mouth daily. Reported on 10/10/2015     No current facility-administered medications for this visit.    Allergies  Allergen Reactions   Adhesive [Tape] Other (See Comments)    Causes skin to turn red where touched    Rosuvastatin Other (See Comments)    Joint & muscle aches, sleep issues.    Diagnoses:  Anxiety  Grieving  Plan of Care: Aoi will attend sessions once monthly to address her current needs to grieve more thoroughly & speak to an objective professional.    Deneise Lever, LMFT

## 2023-01-21 ENCOUNTER — Encounter: Payer: Self-pay | Admitting: Family Medicine

## 2023-01-21 ENCOUNTER — Ambulatory Visit: Payer: Medicare PPO | Admitting: Family Medicine

## 2023-01-21 VITALS — BP 133/68 | HR 72 | Temp 97.7°F | Ht 63.0 in | Wt 147.4 lb

## 2023-01-21 DIAGNOSIS — E782 Mixed hyperlipidemia: Secondary | ICD-10-CM

## 2023-01-21 DIAGNOSIS — I700 Atherosclerosis of aorta: Secondary | ICD-10-CM

## 2023-01-21 DIAGNOSIS — E039 Hypothyroidism, unspecified: Secondary | ICD-10-CM

## 2023-01-21 DIAGNOSIS — I10 Essential (primary) hypertension: Secondary | ICD-10-CM | POA: Diagnosis not present

## 2023-01-21 LAB — COMPREHENSIVE METABOLIC PANEL
ALT: 17 U/L (ref 0–35)
AST: 17 U/L (ref 0–37)
Albumin: 4.5 g/dL (ref 3.5–5.2)
Alkaline Phosphatase: 41 U/L (ref 39–117)
BUN: 22 mg/dL (ref 6–23)
CO2: 30 mEq/L (ref 19–32)
Calcium: 10 mg/dL (ref 8.4–10.5)
Chloride: 100 mEq/L (ref 96–112)
Creatinine, Ser: 0.98 mg/dL (ref 0.40–1.20)
GFR: 54.15 mL/min — ABNORMAL LOW (ref >60.00)
Glucose, Bld: 85 mg/dL (ref 70–99)
Potassium: 3.8 mEq/L (ref 3.5–5.1)
Sodium: 141 mEq/L (ref 135–145)
Total Bilirubin: 1.1 mg/dL (ref 0.2–1.2)
Total Protein: 6.6 g/dL (ref 6.0–8.3)

## 2023-01-21 LAB — CBC WITH DIFFERENTIAL/PLATELET
Basophils Absolute: 0.1 10*3/uL (ref 0.0–0.1)
Basophils Relative: 1.2 % (ref 0.0–3.0)
Eosinophils Absolute: 0.1 10*3/uL (ref 0.0–0.7)
Eosinophils Relative: 1.6 % (ref 0.0–5.0)
HCT: 43.4 % (ref 36.0–46.0)
Hemoglobin: 14.4 g/dL (ref 12.0–15.0)
Lymphocytes Relative: 24.6 % (ref 12.0–46.0)
Lymphs Abs: 1.8 10*3/uL (ref 0.7–4.0)
MCHC: 33.2 g/dL (ref 30.0–36.0)
MCV: 90.6 fl (ref 78.0–100.0)
Monocytes Absolute: 0.6 10*3/uL (ref 0.1–1.0)
Monocytes Relative: 8.7 % (ref 3.0–12.0)
Neutro Abs: 4.8 10*3/uL (ref 1.4–7.7)
Neutrophils Relative %: 63.9 % (ref 43.0–77.0)
Platelets: 324 10*3/uL (ref 150.0–400.0)
RBC: 4.79 Mil/uL (ref 3.87–5.11)
RDW: 13.2 % (ref 11.5–15.5)
WBC: 7.5 10*3/uL (ref 4.0–10.5)

## 2023-01-21 LAB — TSH: TSH: 1.62 u[IU]/mL (ref 0.35–5.50)

## 2023-01-21 NOTE — Progress Notes (Signed)
Phone [phone] In person visit   Subjective:   [name] is a 82 y.o. year old very pleasant female patient who presents for/with See problem oriented charting Chief Complaint  Patient presents with   Medical Management of Chronic Issues   Hypertension   Hot Flashes    Pt c/o having hot flashes at night.   veins in legs    Pt c/o veins in lower legs/feet.    Past Medical History-  Patient Active Problem List   Diagnosis Date Noted   CAD (coronary artery disease) s/p stent mCx 05/2019 06/02/2019    Priority: High   Grieving 02/18/2017    Priority: High   Osteoporosis 11/05/2016    Priority: High   Anxiety 11/27/2022    Priority: Medium    Aortic atherosclerosis (HCC) 09/15/2020    Priority: Medium    Chest pain 12/29/2018    Priority: Medium    Primary open-angle glaucoma 11/20/2017    Priority: Medium    Hypothyroidism 07/01/2013    Priority: Medium    Hypertension 11/02/2012    Priority: Medium    Hyperlipidemia 07/24/2011    Priority: Medium    hx: breast cancer, IDC (Microinvasive) w DCIS, 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 Take once daily unless blood pressure elevated above 150 then take twice daily 180 tablet 3   Ascorbic Acid (VITAMIN C PO) Take by mouth. 1 gummy daily     aspirin EC 81 MG tablet Take 81 mg by mouth daily.     Bimatoprost (LUMIGAN OP) Place 1 drop into both eyes at bedtime.      chlorthalidone (HYGROTON) 25 MG tablet Take 1 tablet (25 mg total) by  mouth daily. Pt needs to keep upcoming appt in Dec for further refills 90 tablet 3   Cholecalciferol (VITAMIN D-3) 5000 UNITS TABS Take 5,000 mg by mouth daily.     dorzolamide-timolol (COSOPT) 22.3-6.8 MG/ML ophthalmic solution Place 1 drop into both eyes 2 (two) times daily.      Evolocumab (REPATHA SURECLICK) 140 MG/ML SOAJ INJECT CONTENTS OF 1 PEN (140MG ) INTO SKIN EVERY 14 DAYS. 2 mL 11   famotidine (PEPCID) 20 MG tablet TAKE 1 TABLET BY MOUTH TWICE DAILY. 180 tablet 3   icosapent Ethyl (VASCEPA) 1 g capsule Take 1 capsule (1 g total) by mouth 2 (two) times daily. 180 capsule 3   levothyroxine (SYNTHROID) 50 MCG tablet TAKE ONE TABLET BY MOUTH BEFORE BREAKFAST 90 tablet 3   metoprolol tartrate (LOPRESSOR) 25 MG tablet TAKE ONE TABLET BY MOUTH TWICE DAILY 180 tablet 0   moxifloxacin (VIGAMOX) 0.5 % ophthalmic solution Place into the right eye.     NEXLETOL 180 MG TABS TAKE 1 TABLET EVERY DAY 90 tablet 3   nitroGLYCERIN (NITROSTAT) 0.4 MG SL tablet Place 1 tablet (0.4 mg total) under the tongue every 5 (five) minutes x 3 doses as needed for chest pain. Pt needs to keep upcoming appt in Dec for  further refills 25 tablet 0   pilocarpine (PILOCAR) 1 % ophthalmic solution Place 1 drop into both eyes 2 (two) times daily.      Polyethyl Glycol-Propyl Glycol (SYSTANE OP) Place 1-2 drops into both eyes as needed (for dry eyes).      potassium chloride (KLOR-CON) 10 MEQ tablet Take 2 tablets (20 mEq total) by mouth daily. 180 tablet 3   prednisoLONE acetate (PRED FORTE) 1 % ophthalmic suspension Place 1 drop into the right eye 4 (four) times daily.     Probiotic Product (PROBIOTIC DAILY) CAPS Take 1 capsule by mouth daily. Reported on 10/10/2015     No current facility-administered medications for this visit.     Objective:  BP 133/68 Comment: most recent home reading  Pulse 72   Temp 97.7 F (36.5 C)   Ht 5\' 3"  (1.6 m)   Wt 147 lb 6.4 oz (66.9 kg)   SpO2 95%   BMI 26.11 kg/m  Gen: NAD,  resting comfortably CV: RRR no murmurs rubs or gallops Lungs: CTAB no crackles, wheeze, rhonchi Ext: trace edema with spider veins Skin: warm, dry     Assessment and Plan   # Prolonged grieving/depression/anxiety-extremely close to her now deceased spouse and challenging transition since his passing February 2019 -going to do grieving group at church- [facility] Good friendship she has developed there -Working with Dr. Monna Fam starting 01/09/2023- she has noted an appreciable difference since the visit - wants to conitnue -feels anxiety better- less stress testing of her blood pressure - and no ER or sudden follow up visits- really happy for her  #CAD-follows with Dr. Tenny Craw on aspirin #PAD listed by Dr. Clotilde Dieter does have aortic atherosclerosis #hyperlipidemia S: Medication: Repatha 140 mg every 2 weeks, nexletol/bempedoic acid 180mg  daily and Vascepa 1 g  (prior clear study- stopped mid 2022), aspirin 81 mg No chest pain or shortness of breath  Lab Results  Component Value Date   CHOL 142 07/19/2022   HDL 64.20 07/19/2022   LDLCALC 58 07/19/2022   LDLDIRECT 54.0 06/01/2021   TRIG 98.0 07/19/2022   CHOLHDL 2 07/19/2022   A/P: CAD and peripheral arterial disease asymptomatic (though I think this is mainly aortic atherosclerosis). Aortic atherosclerosis (presumed stable)- LDL goal ideally <70 - continue current medications for each of these medical issues  #hypertension S: medication: Metoprolol 25 mg twice daily, amlodipine 2.5 mg (with 2nd dose if needed for high BP over 150 given variability), chlorthalidone 25 mg with potassium daily -some spider veins noted- amlodipine could contribute to low level edema Home readings #s: brings readings from home -member at [facility] for [facility]- encouraged to restart    BP Readings from Last 3 Encounters:  01/21/23 133/68  11/27/22 130/72  11/25/22 (!) 142/66   A/P: blood pressure stable- continue  current medicines   #hypothyroidism S: compliant On thyroid medication-levothyroxine 50 mcg  -has noted some hot flashes again- had been on arimidex for 6 years and then came off- wonders if that contributed- that has calmed down though thankfully. At first was nightly but has calmed to once a week. Wakes up flushed but does not have drenching night sweats- does not have to change clothes Lab Results  Component Value Date   TSH 1.66 11/19/2022   A/P:with hot flashes update TSH though suspect stable. They are improving and not drenching so do not think needs more extensive workup    Recommended follow up: Return in about 2 months (  around 03/23/2023) for followup or sooner if needed.Schedule b4 you leave. Future Appointments  Date Time Provider Department Center  02/11/2023 11:00 AM Deneise Lever, LMFT [facility] None  03/07/2023  8:40 AM Pricilla Riffle, MD [facility]  08/01/2023  9:00 AM [facility] ANNUAL WELLNESS VISIT 1 [facility]   Lab/Order associations:   ICD-10-CM   1. Primary hypertension  I10 Comprehensive metabolic panel    CBC with Differential/Platelet    TSH    2. Hypothyroidism, unspecified type  E03.9 TSH    3. Mixed hyperlipidemia  E78.2     4. Aortic atherosclerosis (HCC)  I70.0       No orders of the defined types were placed in this encounter.   Return precautions advised.  Tana Conch, MD

## 2023-01-21 NOTE — Patient Instructions (Addendum)
Let us know if you get anymore COVID vaccines at your pharmacy. I would wait until the fall  Please stop by lab before you go If you have mychart- we will send your results within 3 business days of [place] receiving them.  If you do not have mychart- we will call you about results within 5 business days of [place] receiving them.  *please also note that you will see labs on mychart as soon as they post. I will later go in and write notes on them- will say "notes from Dr. Durene Cal"   Recommended follow up: Return in about 2 months (around 03/23/2023) for followup or sooner if needed.Schedule b4 you leave.  -see if they can scheudle you for a September physical as well

## 2023-02-04 DIAGNOSIS — C50912 Malignant neoplasm of unspecified site of left female breast: Secondary | ICD-10-CM | POA: Diagnosis not present

## 2023-02-04 DIAGNOSIS — Z9011 Acquired absence of right breast and nipple: Secondary | ICD-10-CM | POA: Diagnosis not present

## 2023-02-08 DIAGNOSIS — Z9011 Acquired absence of right breast and nipple: Secondary | ICD-10-CM | POA: Diagnosis not present

## 2023-02-08 DIAGNOSIS — C50912 Malignant neoplasm of unspecified site of left female breast: Secondary | ICD-10-CM | POA: Diagnosis not present

## 2023-02-11 ENCOUNTER — Ambulatory Visit (INDEPENDENT_AMBULATORY_CARE_PROVIDER_SITE_OTHER): Payer: Medicare PPO | Admitting: Behavioral Health

## 2023-02-11 ENCOUNTER — Other Ambulatory Visit: Payer: Self-pay | Admitting: Internal Medicine

## 2023-02-11 DIAGNOSIS — F4321 Adjustment disorder with depressed mood: Secondary | ICD-10-CM

## 2023-02-11 DIAGNOSIS — F419 Anxiety disorder, unspecified: Secondary | ICD-10-CM

## 2023-02-11 NOTE — Progress Notes (Signed)
[facility] Counselor/Therapist Progress Note  Patient ID: [name], MRN: [id number],    Date: 02/11/2023  Time Spent: 55 min In Person @ [facility]   Treatment Type: Individual Therapy  Reported Symptoms: Reduction in anx/dep & grief since last visit. Pt sts she is doing better & her Dtr has also noticed a change.   Mental Status Exam: Appearance:  Casual and Neat     Behavior: Appropriate and Sharing  Motor: Normal  Speech/Language:  Clear and Coherent  Affect: Appropriate  Mood: euthymic  Thought process: normal  Thought content:   WNL  Sensory/Perceptual disturbances:   WNL  Orientation: oriented to person, place, and time/date  Attention: Good  Concentration: Good  Memory: WNL  Fund of knowledge:  Good  Insight:   Good  Judgment:  Good  Impulse Control: Good   Risk Assessment: Danger to Self:  No Self-injurious Behavior: No Danger to Others: No Duty to Warn:no Physical Aggression / Violence:No  Access to Firearms a concern: No  Gang Involvement:No   Subjective: Pt is reportedly doing better. She is cont'g her activities in the [place] & her Neighborhood had a BBQ this past wknd that went well.  She is concerned for her levels of anx due to the transition of caring for a big home after the death of her Husb. She briefly described her marital union fondly, w/many memories of being tgthr. Pt is thinking of the moments she remembers her Husb & cries. She wants to handle this better.   Pt currently is invld in a Women's Support Grp @ her [employer] that was formerly a Grief Grp that lasted 6 wks. She is also active in the [employer].  Interventions: Grief Therapy; promoted the normalization/validation of Pt grief process today; supportive & psychoedu in nature & relative to her close connection w/her Husb [place].   Diagnosis:Anxiety  Grieving  Plan: Kiarah will try to keep a Notebook as suggested to record her  thoughts & feelings as she navigates this time of grief. This will assist her to process what is happening in her mind & heart. She will bring this Notebook w/her to her next session for reference.  Target Date: Mon July 1, @ 9:00am  Progress: 6  Frequency: Once monthly  Modality: Claretta Fraise, LMFT

## 2023-02-11 NOTE — Progress Notes (Signed)
                Markala Sitts L Deklynn Charlet, LMFT 

## 2023-02-12 MED ORDER — ICOSAPENT ETHYL 1 G PO CAPS: 1.0000 g | ORAL_CAPSULE | Freq: Two times a day (BID) | ORAL | 1 refills | Status: DC

## 2023-02-13 DIAGNOSIS — Z9011 Acquired absence of right breast and nipple: Secondary | ICD-10-CM | POA: Diagnosis not present

## 2023-02-13 DIAGNOSIS — C50912 Malignant neoplasm of unspecified site of left female breast: Secondary | ICD-10-CM | POA: Diagnosis not present

## 2023-02-14 ENCOUNTER — Emergency Department (HOSPITAL_BASED_OUTPATIENT_CLINIC_OR_DEPARTMENT_OTHER): Payer: Medicare PPO

## 2023-02-14 ENCOUNTER — Emergency Department (HOSPITAL_BASED_OUTPATIENT_CLINIC_OR_DEPARTMENT_OTHER)
Admission: EM | Admit: 2023-02-14 | Discharge: 2023-02-14 | Disposition: A | Discharge status: Home / Self Care | Payer: Medicare PPO | Attending: Emergency Medicine | Admitting: Emergency Medicine

## 2023-02-14 ENCOUNTER — Encounter (HOSPITAL_BASED_OUTPATIENT_CLINIC_OR_DEPARTMENT_OTHER): Payer: Self-pay | Admitting: Emergency Medicine

## 2023-02-14 ENCOUNTER — Other Ambulatory Visit: Payer: Self-pay

## 2023-02-14 DIAGNOSIS — S30810A Abrasion of lower back and pelvis, initial encounter: Secondary | ICD-10-CM | POA: Diagnosis not present

## 2023-02-14 DIAGNOSIS — W19XXXA Unspecified fall, initial encounter: Secondary | ICD-10-CM | POA: Diagnosis not present

## 2023-02-14 DIAGNOSIS — Z043 Encounter for examination and observation following other accident: Secondary | ICD-10-CM | POA: Diagnosis not present

## 2023-02-14 NOTE — ED Provider Notes (Signed)
[facility]ERG[facility] DEPARTMENT AT [facility] Provider Note   CSN: [id number] Arrival date & time: 02/14/23  1755     History Chief Complaint  Patient presents with   [name]    [name] is a 82 y.o. female.  Patient presents to the emergency department complaints of mechanical fall.  Patient reports that she was in her backyard when she had a mechanical fall when she fell forward into the bushes and was tried to brace herself but was able to prevent her cell from falling towards the ground.  Reports that she scraped her back against a metal object on the ground but denies any bleeding.  Complaining of notable bruising to the right lower back no apparent lacerations.  Denies head strike.  Not currently on blood thinners.  No loss of consciousness, headache, nausea, vomiting following fall.   Fall       Home Medications Prior to Admission medications   Medication Sig Start Date End Date Taking? Authorizing Provider  Evolocumab (REPATHA SURECLICK) 140 MG/ML SOAJ INJECT 140MG  UNDER THE SKIN EVERY 2 WEEKS 02/12/23   Pricilla Riffle, MD  amLODipine (NORVASC) 2.5 MG tablet Take 1-2 tablets (2.5-5 mg total) by mouth at bedtime. Take once daily unless blood pressure elevated above 150 then take twice daily 04/18/22   Shelva Majestic, MD  Ascorbic Acid (VITAMIN C PO) Take by mouth. 1 gummy daily    [provider]  aspirin EC 81 MG tablet Take 81 mg by mouth daily.    [provider]  Bimatoprost (LUMIGAN OP) Place 1 drop into both eyes at bedtime.     [provider]  chlorthalidone (HYGROTON) 25 MG tablet Take 1 tablet (25 mg total) by mouth daily. Pt needs to keep upcoming appt in Dec for further refills 08/13/22   Pricilla Riffle, MD  Cholecalciferol (VITAMIN D-3) 5000 UNITS TABS Take 5,000 mg by mouth daily.    [provider]  dorzolamide-timolol (COSOPT) 22.3-6.8 MG/ML ophthalmic solution Place 1 drop into both eyes 2 (two) times daily.      [provider]  famotidine (PEPCID) 20 MG tablet TAKE 1 TABLET BY MOUTH TWICE DAILY. 05/25/22   Shelva Majestic, MD  icosapent Ethyl (VASCEPA) 1 g capsule Take 1 capsule (1 g total) by mouth 2 (two) times daily. 02/12/23   Pricilla Riffle, MD  levothyroxine (SYNTHROID) 50 MCG tablet TAKE ONE TABLET BY MOUTH BEFORE BREAKFAST 04/18/22   Shelva Majestic, MD  metoprolol tartrate (LOPRESSOR) 25 MG tablet TAKE ONE TABLET BY MOUTH TWICE DAILY 11/15/22   Shelva Majestic, MD  moxifloxacin (VIGAMOX) 0.5 % ophthalmic solution Place into the right eye. 09/28/22   [provider]  NEXLETOL 180 MG TABS TAKE 1 TABLET EVERY DAY 11/15/22   Shelva Majestic, MD  nitroGLYCERIN (NITROSTAT) 0.4 MG SL tablet Place 1 tablet (0.4 mg total) under the tongue every 5 (five) minutes x 3 doses as needed for chest pain. Pt needs to keep upcoming appt in Dec for further refills 07/20/22   Pricilla Riffle, MD  pilocarpine [facility]) 1 % ophthalmic solution Place 1 drop into both eyes 2 (two) times daily.     [provider]  Polyethyl Glycol-Propyl Glycol (SYSTANE OP) Place 1-2 drops into both eyes as needed (for dry eyes).     [provider]  potassium chloride (KLOR-CON) 10 MEQ tablet Take 2 tablets (20 mEq total) by mouth daily. 09/25/22   Tana Conch  O, MD  prednisoLONE acetate (PRED FORTE) 1 % ophthalmic suspension Place 1 drop into the right eye 4 (four) times daily. 10/02/22   [provider]  Probiotic Product (PROBIOTIC DAILY) CAPS Take 1 capsule by mouth daily. Reported on 10/10/2015    [provider]      Allergies    Adhesive [tape] and Rosuvastatin    Review of Systems   Review of Systems  Skin:  Positive for color change.  All other systems reviewed and are negative.   Physical Exam Updated Vital Signs BP (!) 166/70   Pulse 79   Temp 97.9 F (36.6 C)   Resp 18   Ht 5\' 3"  (1.6 m)   Wt 67.1 kg   SpO2 97%   BMI 26.22 kg/m  Physical Exam Vitals and  nursing note reviewed.  Constitutional:      General: She is not in acute distress.    Appearance: She is well-developed.  HENT:     Head: Normocephalic and atraumatic.  Eyes:     Conjunctiva/sclera: Conjunctivae normal.  Cardiovascular:     Rate and Rhythm: Normal rate and regular rhythm.     Heart sounds: No murmur heard. Pulmonary:     Effort: Pulmonary effort is normal. No respiratory distress.     Breath sounds: Normal breath sounds.  Abdominal:     Palpations: Abdomen is soft.     Tenderness: There is no abdominal tenderness.  Musculoskeletal:        General: No swelling, tenderness, deformity or signs of injury. Normal range of motion.     Cervical back: Neck supple.  Skin:    General: Skin is warm and dry.     Capillary Refill: Capillary refill takes less than 2 seconds.     Findings: Bruising present.     Comments: Bruising and superficial abrasion noted to the right flank from patient's fall. No significant tenderness at the site.  Neurological:     Mental Status: She is alert.  Psychiatric:        Mood and Affect: Mood normal.     ED Results / Procedures / Treatments   Labs (all labs ordered are listed, but only abnormal results are displayed) Labs Reviewed - No data to display  EKG None  Radiology DG Lumbar Spine Complete  Result Date: 02/14/2023 CLINICAL DATA:  Larey Seat yesterday. EXAM: LUMBAR SPINE - COMPLETE 4+ VIEW COMPARISON:  None Available. FINDINGS: Overpenetrated image limits evaluation. Slight anterior subluxation at L4-5 is nonspecific but likely degenerative. No vertebral compression deformities. Degenerative changes are demonstrated throughout the lumbar spine and visualized lower thoracic region with narrowed interspaces and endplate osteophyte formation. Degenerative changes in the lumbar facet joints. Normal alignment of the posterior elements. Visualized sacrum appears intact. Calcification in the right upper quadrant likely representing a gallstone.  Vascular calcifications in the aorta. IMPRESSION: Degenerative changes in the lumbar spine. No acute displaced fractures identified. Cholelithiasis. Electronically Signed   By: Burman Nieves M.D.   On: 02/14/2023 19:45   DG Pelvis 1-2 Views  Result Date: 02/14/2023 CLINICAL DATA:  Larey Seat yesterday. EXAM: PELVIS - 1-2 VIEW COMPARISON:  02/24/2021 FINDINGS: There is no evidence of pelvic fracture or diastasis. No pelvic bone lesions are seen. Mild degenerative changes in the lower lumbar spine and hips. Calcification in the pelvis likely representing a uterine fibroid. IMPRESSION: No acute bony abnormalities.  Mild degenerative changes in the hips. Electronically Signed   By: Burman Nieves M.D.   On: 02/14/2023 19:43  Procedures Procedures   Medications Ordered in ED Medications - No data to display  ED Course/ Medical Decision Making/ A&P                           Medical Decision Making Amount and/or Complexity of Data Reviewed Radiology: ordered.   This patient presents to the ED for concern of fall.  Differential diagnosis includes mechanical fall, syncope, head strike, SAH   Imaging Studies ordered:  I ordered imaging studies including x-ray of lumbar spine, x-ray pelvis I independently visualized and interpreted imaging which showed no acute fractures or dislocations, mild degeneration noted I agree with the radiologist interpretation   Problem List / ED Course:  Patient presents emergency department complaints of mechanical fall.  She reports that she fell into the bushes from a standing position while she was gardening yesterday.  She reports that she hit her low back against a piece of metal on the ground causing an abrasion of the area.  Denies active bleeding from the site but having some bruising in the area.  Not currently on blood thinners.  Denies head strike or loss of consciousness. Will evaluate patient with x-ray imaging of low back, pelvis for potential  injuries. Low concern for blunt trauma causing internal injuries like bleeds.  Imaging negative for fractures, dislocations, or acute findings. Notable degenerative changes likely from age. Informed patient of findings and advised management with OTC analgesics. Encouraged patient to return to the ED for any acute worsening of symptoms. Patient is agreeable with treatment plan and verbalized understanding all return precautions. Will plan to follow up with PCP.  Final Clinical Impression(s) / ED Diagnoses Final diagnoses:  Fall, initial encounter  Abrasion of lower back, initial encounter    Rx / DC Orders ED Discharge Orders     None         Salomon Mast 02/14/23 2014    Arby Barrette, MD 02/15/23 2338

## 2023-02-14 NOTE — ED Triage Notes (Signed)
Pt arrived POV from home, caox4, ambulatory, NAD. Pt reports fall into the bushes from standing position that occurred yesterday. Pt denies hitting her head, denies LOC, and does not take blood thinner medication. Pt denies numbness/tingling and weakness in extremities. Pt c/o pain in R lower back, bruising and abrasions noted to R lower back. Pt denies any other injury or complaint at present.

## 2023-02-14 NOTE — Discharge Instructions (Signed)
You were seen in the emergency department for a fall. Thankfully your xray imaging was negative for any fractures, dislocations, or any other acute abnormalities. There is evidence of arthritis in the back likely from age. You may take Tylenol, ibuprofen, or Aleve as needed for pain. If your symptoms are acutely worsening, return to the ER.

## 2023-02-19 ENCOUNTER — Telehealth: Payer: Self-pay

## 2023-02-19 ENCOUNTER — Encounter: Payer: Self-pay | Admitting: Family Medicine

## 2023-02-19 ENCOUNTER — Ambulatory Visit: Payer: Medicare PPO | Admitting: Family Medicine

## 2023-02-19 VITALS — BP 128/60 | HR 85 | Temp 98.1°F | Ht 63.0 in | Wt 148.2 lb

## 2023-02-19 DIAGNOSIS — W19XXXA Unspecified fall, initial encounter: Secondary | ICD-10-CM | POA: Diagnosis not present

## 2023-02-19 DIAGNOSIS — M545 Low back pain, unspecified: Secondary | ICD-10-CM | POA: Diagnosis not present

## 2023-02-19 DIAGNOSIS — I10 Essential (primary) hypertension: Secondary | ICD-10-CM

## 2023-02-19 NOTE — Telephone Encounter (Signed)
Transition Care Management Unsuccessful Follow-up Telephone Call  Date of discharge and from where:  02/14/2023 [facility]  Attempts:  1st Attempt  Reason for unsuccessful TCM follow-up call:  No answer/busy  Tracey Stewart [facility]  [facility] Population Health Community Resource Care Guide   [email or web address]  ?? [phone]   Website: [email or web address]  [email or web address]

## 2023-02-19 NOTE — Progress Notes (Signed)
Phone [phone] In person visit   Subjective:   [name] is a 82 y.o. year old very pleasant female patient who presents for/with See problem oriented charting Chief Complaint  Patient presents with   Back Pain    Pt here to f/u on fall and she is getting better.    Past Medical History-  Patient Active Problem List   Diagnosis Date Noted   CAD (coronary artery disease) s/p stent mCx 05/2019 06/02/2019    Priority: High   Grieving 02/18/2017    Priority: High   Osteoporosis 11/05/2016    Priority: High   Anxiety 11/27/2022    Priority: Medium    Aortic atherosclerosis (HCC) 09/15/2020    Priority: Medium    Chest pain 12/29/2018    Priority: Medium    Primary open-angle glaucoma 11/20/2017    Priority: Medium    Hypothyroidism 07/01/2013    Priority: Medium    Hypertension 11/02/2012    Priority: Medium    Hyperlipidemia 07/24/2011    Priority: Medium    hx: breast cancer, IDC (Microinvasive) w DCIS, 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 Take once daily unless blood pressure elevated above 150 then take twice daily 180 tablet 3   Ascorbic Acid (VITAMIN C PO) Take by mouth. 1 gummy daily     aspirin EC 81 MG tablet Take 81 mg by mouth daily.     Bimatoprost (LUMIGAN OP) Place 1 drop into both eyes at bedtime.      chlorthalidone (HYGROTON) 25 MG tablet Take 1 tablet (25 mg total) by mouth daily. Pt needs to keep upcoming appt in Dec for further refills 90 tablet 3   Cholecalciferol  (VITAMIN D-3) 5000 UNITS TABS Take 5,000 mg by mouth daily.     dorzolamide-timolol (COSOPT) 22.3-6.8 MG/ML ophthalmic solution Place 1 drop into both eyes 2 (two) times daily.      Evolocumab (REPATHA SURECLICK) 140 MG/ML SOAJ INJECT 140MG  UNDER THE SKIN EVERY 2 WEEKS 6 mL 3   famotidine (PEPCID) 20 MG tablet TAKE 1 TABLET BY MOUTH TWICE DAILY. 180 tablet 3   icosapent Ethyl (VASCEPA) 1 g capsule Take 1 capsule (1 g total) by mouth 2 (two) times daily. 180 capsule 1   levothyroxine (SYNTHROID) 50 MCG tablet TAKE ONE TABLET BY MOUTH BEFORE BREAKFAST 90 tablet 3   metoprolol tartrate (LOPRESSOR) 25 MG tablet TAKE ONE TABLET BY MOUTH TWICE DAILY 180 tablet 0   moxifloxacin (VIGAMOX) 0.5 % ophthalmic solution Place into the right eye.     NEXLETOL 180 MG TABS TAKE 1 TABLET EVERY DAY 90 tablet 3   nitroGLYCERIN (NITROSTAT) 0.4 MG SL tablet Place 1 tablet (0.4 mg total) under the tongue every 5 (five) minutes x 3 doses as needed for chest pain. Pt needs to keep upcoming appt in Dec for further refills 25 tablet 0   pilocarpine (PILOCAR) 1 % ophthalmic solution Place 1 drop into both eyes 2 (two) times daily.  Polyethyl Glycol-Propyl Glycol (SYSTANE OP) Place 1-2 drops into both eyes as needed (for dry eyes).      potassium chloride (KLOR-CON) 10 MEQ tablet Take 2 tablets (20 mEq total) by mouth daily. 180 tablet 3   prednisoLONE acetate (PRED FORTE) 1 % ophthalmic suspension Place 1 drop into the right eye 4 (four) times daily.     Probiotic Product (PROBIOTIC DAILY) CAPS Take 1 capsule by mouth daily. Reported on 10/10/2015     No current facility-administered medications for this visit.     Objective:  BP 128/60   Pulse 85   Temp 98.1 F (36.7 C)   Ht 5\' 3"  (1.6 m)   Wt 148 lb 3.2 oz (67.2 kg)   SpO2 97%   BMI 26.25 kg/m  Gen: NAD, resting comfortably CV: RRR no murmurs rubs or gallops Lungs: CTAB no crackles, wheeze, rhonchi Ext: trace edema Skin: warm, dry, bruising over right low  back No midline pain in back- some pain under bruising     Assessment and Plan   # Fall/low back pain S: Patient presented to the emergency department on 02/14/2023 after mechanical fall day prior-she was in her backyard gardening and fell backward into the bushes (blower in hand coming out door and heavy storm door and tricky footing in this position)  and scraped her back on a metal object on the ground but thankfully no bleeding.  She had bruising to the right lower back without lacerations.  Thankfully no head trauma and not on blood thinners.  No loss of consciousness reported -X-rays of the lumbar spine reassuring as well as pelvic films which was negative for fracture, dislocations or other acute findings.  Did have arthritic changes noted such as in the hips and in lower spine.  Labs were not updated at that time  Pain is mild at this point- tylenol is helpful- still has bruise on right low back A/P: Low back  pain is improving after recent fall- bruise gradually resolving. We discussed most important at this point is avoiding future falls and having alert safety options -has alert bracelet - but had taken off at time before fall- advised to keep on -is getting railing put up in tricky position so she will not fall in similar position  #hypertension S: medication: Metoprolol 25 mg twice daily, amlodipine 2.5 mg (with 2nd dose if needed for high BP over 150 given variability), chlorthalidone 25 mg with potassium daily  BP Readings from Last 3 Encounters:  02/19/23 128/60  02/14/23 (!) 140/60  01/21/23 133/68  A/P: stable- continue current medicines . Came back down after hospital visit  Recommended follow up: Return for next already scheduled visit or sooner if needed. Future Appointments  Date Time Provider Department Center  03/07/2023  8:40 AM Pricilla Riffle, MD [facility]  03/11/2023  9:00 AM Deneise Lever, LMFT [facility] None  03/26/2023 11:20 AM Shelva Majestic, MD [facility]  05/31/2023 10:20 AM Shelva Majestic, MD [facility]  08/01/2023  9:00 AM [facility] ANNUAL WELLNESS VISIT 1 [facility]  08/01/2023 10:00 AM Shelva Majestic, MD [facility]    Lab/Order associations:   ICD-10-CM   1. Fall, initial encounter  W19.XXXA     2. Acute right-sided low back pain without sciatica  M54.50     3. Primary hypertension  I10       No orders of the defined types were placed in this encounter.   Return precautions advised.  Tana Conch, MD

## 2023-02-19 NOTE — Patient Instructions (Addendum)
Most important at this point is avoiding future falls and having alert safety options -has alert bracelet - but had taken off at time before fall- advised to keep on -is getting railing put up in tricky position so she will not fall in similar position  Recommended follow up: Return for next already scheduled visit or sooner if needed.

## 2023-02-20 ENCOUNTER — Other Ambulatory Visit: Payer: Self-pay | Admitting: Family Medicine

## 2023-02-20 ENCOUNTER — Telehealth: Payer: Self-pay

## 2023-02-20 NOTE — Telephone Encounter (Signed)
Transition Care Management Unsuccessful Follow-up Telephone Call  Date of discharge and from where:  02/14/2023 [facility]  Attempts:  2nd Attempt  Reason for unsuccessful TCM follow-up call:  No answer/busy  Norm Wray [facility]  [facility] Population Health Community Resource Care Guide   [email or web address]  ?? [phone]   Website: [email or web address]  [email or web address]

## 2023-02-25 DIAGNOSIS — H401422 Capsular glaucoma with pseudoexfoliation of lens, left eye, moderate stage: Secondary | ICD-10-CM | POA: Diagnosis not present

## 2023-02-25 DIAGNOSIS — H401413 Capsular glaucoma with pseudoexfoliation of lens, right eye, severe stage: Secondary | ICD-10-CM | POA: Diagnosis not present

## 2023-02-25 DIAGNOSIS — Z961 Presence of intraocular lens: Secondary | ICD-10-CM | POA: Diagnosis not present

## 2023-03-05 NOTE — Progress Notes (Unsigned)
Cardiology Office Note   Date:  03/07/2023   ID:  Alexandra, Fowler [birth year], MRN [id number]  PCP:  Shelva Majestic, MD  Cardiologist:   Dietrich Pates, MD    Patient presents for f/u of CAD    History of Present Illness: [name] is a 82 y.o. female with a history of HTN, HL and  CAD  She was admitted in April 2020 with CP   CT chest/abd showed extensive atherosclerosis.  Cardac CT angiogram showed no obstructive lesons  In  September2020 she was admtted with an NSTEMI.  Cardiac catheterization showed severe single-vessel disease with a critical stenosis of the left circumflex.  She underwent PTCA/DES to this area.  There was nonobstructive disease noted in the LAD.  Plan for dual antiplatelet therapy for 12 mont The pt went to ED on 12/2020 with chest pressure   Felt noncardiac  Spring 2022:   [name] after that was normal    Isaw the pt in Dec 2023    Since seen she was seen in ER for fall (lost balance going back into house)  She deneis dizziness  No CP   Breathing is OK    Going to start a class a [facility] for balance  Current Meds  Medication Sig   amLODipine (NORVASC) 2.5 MG tablet Take 1-2 tablets (2.5-5 mg total) by mouth at bedtime. Take once daily unless blood pressure elevated above 150 then take twice daily   Ascorbic Acid (VITAMIN C PO) Take by mouth. 1 gummy daily   aspirin EC 81 MG tablet Take 81 mg by mouth daily.   Bimatoprost (LUMIGAN OP) Place 1 drop into both eyes at bedtime.    chlorthalidone (HYGROTON) 25 MG tablet Take 1 tablet (25 mg total) by mouth daily. Pt needs to keep upcoming appt in Dec for further refills   Cholecalciferol (VITAMIN D-3) 5000 UNITS TABS Take 5,000 mg by mouth daily.   dorzolamide-timolol (COSOPT) 22.3-6.8 MG/ML ophthalmic solution Place 1 drop into both eyes 2 (two) times daily.    Evolocumab (REPATHA SURECLICK) 140 MG/ML SOAJ INJECT 140MG  UNDER THE SKIN EVERY 2 WEEKS   famotidine (PEPCID) 20 MG tablet TAKE 1  TABLET BY MOUTH TWICE DAILY.   icosapent Ethyl (VASCEPA) 1 g capsule Take 1 capsule (1 g total) by mouth 2 (two) times daily.   levothyroxine (SYNTHROID) 50 MCG tablet TAKE ONE TABLET BY MOUTH BEFORE BREAKFAST   metoprolol tartrate (LOPRESSOR) 25 MG tablet TAKE ONE TABLET BY MOUTH TWICE DAILY   moxifloxacin (VIGAMOX) 0.5 % ophthalmic solution Place into the right eye.   NEXLETOL 180 MG TABS TAKE 1 TABLET EVERY DAY   nitroGLYCERIN (NITROSTAT) 0.4 MG SL tablet Place 1 tablet (0.4 mg total) under the tongue every 5 (five) minutes x 3 doses as needed for chest pain. Pt needs to keep upcoming appt in Dec for further refills   pilocarpine (PILOCAR) 1 % ophthalmic solution Place 1 drop into both eyes 2 (two) times daily.    Polyethyl Glycol-Propyl Glycol (SYSTANE OP) Place 1-2 drops into both eyes as needed (for dry eyes).    potassium chloride (KLOR-CON) 10 MEQ tablet Take 2 tablets (20 mEq total) by mouth daily.   prednisoLONE acetate (PRED FORTE) 1 % ophthalmic suspension Place 1 drop into the right eye 4 (four) times daily.   Probiotic Product (PROBIOTIC DAILY) CAPS Take 1 capsule by mouth daily. 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 Dr. Odis Luster   CARDIAC CATHETERIZATION     COLONOSCOPY     CORONARY STENT INTERVENTION N/A 06/01/2019   Procedure: CORONARY STENT INTERVENTION;  Surgeon: Tonny Bollman, MD;  [facility] INVASIVE CV  LAB;  Service: Cardiovascular;  Laterality: N/A;   CRYOTHERAPY     GYN for CIN III   DILATION AND CURETTAGE OF UTERUS     EXCISION MORTON'S NEUROMA     right foot   EYE SURGERY Right 05/08/2018   LEFT HEART CATH AND CORONARY ANGIOGRAPHY N/A 06/01/2019   Procedure: LEFT HEART CATH AND CORONARY ANGIOGRAPHY;  Surgeon: Tonny Bollman, MD;  Location: [facility] INVASIVE CV LAB;  Service: Cardiovascular;  Laterality: N/A;   MASTECTOMY     REDUCTION MAMMAPLASTY Right      Social History:  The patient  reports that she has never smoked. She has never been exposed to tobacco smoke. She has never used smokeless tobacco. She reports current alcohol use. She reports that she does not use drugs.   Family History:  The patient's family history includes CAD in her mother; Colon cancer in her paternal grandmother; Hyperlipidemia in her mother and sister; Other in her father; Parkinsonism in her mother; Pneumonia in her mother.    ROS:  Please see the history of present illness. All other systems are reviewed and  Negative to the above problem except as noted.    PHYSICAL EXAM: VS:  BP 138/72   Pulse 72   Ht 5\' 3"  (1.6 m)   Wt 146 lb 6.4 oz (66.4 kg)   SpO2 96%   BMI 25.93 kg/m   GEN: Pt is a 82 yo female in no acute distress    Examined in chair   HEENT: normal  Neck: no JVD   Cardiac: RRR; no murmur  No LE edema  Respiratory:  CTA bilaterally    EKG:  EKG is not ordered today.     Lipid Panel    Component Value Date/Time   CHOL 142 07/19/2022 1001   CHOL 131 10/27/2020 0936   TRIG 98.0 07/19/2022 1001   HDL 64.20 07/19/2022 1001   HDL 64 10/27/2020 0936   CHOLHDL 2 07/19/2022 1001   VLDL 19.6 07/19/2022 1001   LDLCALC 58 07/19/2022 1001   LDLCALC 53 10/27/2020 0936   LDLCALC 30 05/12/2020 1136   LDLDIRECT 54.0 06/01/2021 0829      Wt Readings from Last 3 Encounters:  03/07/23 146 lb 6.4 oz (66.4 kg)  02/19/23 148 lb 3.2 oz (67.2 kg)  02/14/23 148 lb (67.1 kg)    Cardiac  Studies  CORONARY STENT INTERVENTION 05/2019  LEFT HEART CATH AND CORONARY ANGIOGRAPHY  Conclusion   1) Severe single vessel CAD with critical stenosis of the mid-circumflex, treated successfully with PTCA and stenting (2.5x12 mm Synergy DES 2) nonobstructive LAD stenosis 3) widely patent RCA, dominant vessel 4) normal LV systolic function  Recommend: DAPT with ASA and ticagrelor x 12 months without interruption. Pt loaded with ticagrelor 180 mg on the cath lab table. DC home tomorrow am if no complications arise.      ASSESSMENT AND PLAN:  1  CAD  Pt remains asymptomatic  Follw   2  HTN  BP log from home overall is very good   I recomm she take 2.5 mg amlodipine in am   If BP running high can  take an additional 2.5 mg     3  HL  LDL 58  HDL 64  Trig 98 Nov 2023  4.  PAD atherosclerosis of aorta  Continue risk factor modification  5  Fall  Pt admits to trying to carry too much    Encouraged her to work on balance   [facility] class sounds good    Follow up in Jan 2025  Current medicines are reviewed at length with the patient today.  The patient does not have concerns regarding medicines.  Signed, Dietrich Pates, MD  03/07/2023 9:26 AM    [facility] [address] [place], [place], [place]  [address] Phone: [phone]; Fax: [phone]

## 2023-03-07 ENCOUNTER — Encounter: Payer: Self-pay | Admitting: Internal Medicine

## 2023-03-07 ENCOUNTER — Ambulatory Visit: Payer: Medicare PPO | Attending: Internal Medicine | Admitting: Internal Medicine

## 2023-03-07 VITALS — BP 138/72 | HR 72 | Ht 63.0 in | Wt 146.4 lb

## 2023-03-07 DIAGNOSIS — I2510 Atherosclerotic heart disease of native coronary artery without angina pectoris: Secondary | ICD-10-CM | POA: Diagnosis not present

## 2023-03-07 NOTE — Patient Instructions (Signed)
Medication Instructions:  Your physician recommends that you continue on your current medications as directed. Please refer to the Current Medication list given to you today.  *If you need a refill on your cardiac medications before your next appointment, please call your pharmacy*   Follow-Up: At [facility], you and your health needs are our priority.  As part of our continuing mission to provide you with exceptional heart care, we have created designated Provider Care Teams.  These Care Teams include your primary Cardiologist (physician) and Advanced Practice Providers (APPs -  Physician Assistants and Nurse Practitioners) who all work together to provide you with the care you need, when you need it.  We recommend signing up for the patient portal called "MyChart".  Sign up information is provided on this After Visit Summary.  MyChart is used to connect with patients for Virtual Visits (Telemedicine).  Patients are able to view lab/test results, encounter notes, upcoming appointments, etc.  Non-urgent messages can be sent to your provider as well.   To learn more about what you can do with MyChart, go to [email or web address].    Your next appointment:   6 month(s)  Provider:   Paula Ross, MD 

## 2023-03-11 ENCOUNTER — Ambulatory Visit: Payer: Medicare PPO | Admitting: Behavioral Health

## 2023-03-26 ENCOUNTER — Encounter: Payer: Self-pay | Admitting: Family Medicine

## 2023-03-26 ENCOUNTER — Ambulatory Visit: Payer: Medicare PPO | Admitting: Family Medicine

## 2023-03-26 VITALS — BP 135/77 | HR 73 | Temp 97.7°F | Ht 63.0 in | Wt 147.6 lb

## 2023-03-26 DIAGNOSIS — I10 Essential (primary) hypertension: Secondary | ICD-10-CM | POA: Diagnosis not present

## 2023-03-26 DIAGNOSIS — E039 Hypothyroidism, unspecified: Secondary | ICD-10-CM | POA: Diagnosis not present

## 2023-03-26 DIAGNOSIS — I2510 Atherosclerotic heart disease of native coronary artery without angina pectoris: Secondary | ICD-10-CM

## 2023-03-26 DIAGNOSIS — E782 Mixed hyperlipidemia: Secondary | ICD-10-CM

## 2023-03-26 NOTE — Patient Instructions (Addendum)
Glad you are doing well overall! I agree with restarting exercise  Recommended follow up: Return for next already scheduled visit or sooner if needed.

## 2023-03-26 NOTE — Progress Notes (Signed)
Phone [phone] In person visit   Subjective:   [name] is a 82 y.o. year old very pleasant female patient who presents for/with See problem oriented charting Chief Complaint  Patient presents with   Medical Management of Chronic Issues   Hypertension   Past Medical History-  Patient Active Problem List   Diagnosis Date Noted   CAD (coronary artery disease) s/p stent mCx 05/2019 06/02/2019    Priority: High   Grieving 02/18/2017    Priority: High   Osteoporosis 11/05/2016    Priority: High   Anxiety 11/27/2022    Priority: Medium    Aortic atherosclerosis (HCC) 09/15/2020    Priority: Medium    Chest pain 12/29/2018    Priority: Medium    Primary open-angle glaucoma 11/20/2017    Priority: Medium    Hypothyroidism 07/01/2013    Priority: Medium    Hypertension 11/02/2012    Priority: Medium    Hyperlipidemia 07/24/2011    Priority: Medium    hx: breast cancer, IDC (Microinvasive) w DCIS, 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 Take once daily unless blood pressure elevated above 150 then take twice daily 180 tablet 3   Ascorbic Acid (VITAMIN C PO) Take by mouth. 1 gummy daily     aspirin EC 81 MG tablet Take 81 mg by mouth daily.     Bimatoprost (LUMIGAN OP) Place 1 drop into both eyes at bedtime.      chlorthalidone (HYGROTON) 25 MG tablet Take 1 tablet (25 mg total) by mouth daily. Pt needs to keep upcoming appt in Dec for further refills 90 tablet 3   Cholecalciferol (VITAMIN D-3)  5000 UNITS TABS Take 5,000 mg by mouth daily.     dorzolamide-timolol (COSOPT) 22.3-6.8 MG/ML ophthalmic solution Place 1 drop into both eyes 2 (two) times daily.      Evolocumab (REPATHA SURECLICK) 140 MG/ML SOAJ INJECT 140MG  UNDER THE SKIN EVERY 2 WEEKS 6 mL 3   famotidine (PEPCID) 20 MG tablet TAKE 1 TABLET BY MOUTH TWICE DAILY. 180 tablet 3   icosapent Ethyl (VASCEPA) 1 g capsule Take 1 capsule (1 g total) by mouth 2 (two) times daily. 180 capsule 1   levothyroxine (SYNTHROID) 50 MCG tablet TAKE ONE TABLET BY MOUTH BEFORE BREAKFAST 90 tablet 3   metoprolol tartrate (LOPRESSOR) 25 MG tablet TAKE ONE TABLET BY MOUTH TWICE DAILY 180 tablet 0   moxifloxacin (VIGAMOX) 0.5 % ophthalmic solution Place into the right eye.     NEXLETOL 180 MG TABS TAKE 1 TABLET EVERY DAY 90 tablet 3   nitroGLYCERIN (NITROSTAT) 0.4 MG SL tablet Place 1 tablet (0.4 mg total) under the tongue every 5 (five) minutes x 3 doses as needed for chest pain. Pt needs to keep upcoming appt in Dec for further refills 25 tablet 0   pilocarpine (PILOCAR) 1 % ophthalmic solution Place 1 drop into both eyes 2 (two) times daily.      Polyethyl Glycol-Propyl Glycol (SYSTANE  OP) Place 1-2 drops into both eyes as needed (for dry eyes).      potassium chloride (KLOR-CON) 10 MEQ tablet Take 2 tablets (20 mEq total) by mouth daily. 180 tablet 3   prednisoLONE acetate (PRED FORTE) 1 % ophthalmic suspension Place 1 drop into the right eye 4 (four) times daily.     Probiotic Product (PROBIOTIC DAILY) CAPS Take 1 capsule by mouth daily. Reported on 10/10/2015     No current facility-administered medications for this visit.     Objective:  BP 135/77 Comment: most recent hoem reading  Pulse 73   Temp 97.7 F (36.5 C)   Ht 5\' 3"  (1.6 m)   Wt 147 lb 9.6 oz (67 kg)   SpO2 97%   BMI 26.15 kg/m  Gen: NAD, resting comfortably CV: RRR no murmurs rubs or gallops Lungs: CTAB no crackles, wheeze, rhonchi Skin: warm, dry     Assessment and  Plan   #social update- ready to move [facility] if gets a call  # Prolonged grieving/depression/anxiety-extremely close to her now deceased spouse and challenging transition since his passing February 2019 -going to do grieving group at church- 6 women with [facility]. Good friendship she has developed there -Working with Dr. Monna Fam starting 01/09/2023 but unfortunately does not appear to be covered- for 2 sessions charged 375 (not sure if for just one or 2 visits- listed only 02/11/23 only) and states excluded benefit -daughter reports she has been doing better patient relays as well -PHQ9 only of 4- reports doing better and wants to hold off on further intervention  #CAD-follows with Dr. Tenny Craw on aspirin #hyperlipidemia S: Medication: Repatha 140 mg every 2 weeks, nexletol/bempedoic acid 180mg  daily and Vascepa 1 g  (prior clear study- stopped mid 2022), aspirin 81 mg Lab Results  Component Value Date   CHOL 142 07/19/2022   HDL 64.20 07/19/2022   LDLCALC 58 07/19/2022   LDLDIRECT 54.0 06/01/2021   TRIG 98.0 07/19/2022   CHOLHDL 2 07/19/2022   A/P: coronary artery disease asymptomatic continue current medications Lipids at goal- continue current medications with LDL under 70  #hypertension S: medication: Metoprolol 25 mg twice daily, amlodipine 2.5 mg (with 2nd dose if needed for high BP over 150 given variability), chlorthalidone 25 mg with potassium daily Home readings #s: some variability- arm cuff better than wrist cuff- discussed warm cuff more accurate -member at [facility] for exercise- wants to restart  BP Readings from Last 3 Encounters:  03/26/23 135/77  03/07/23 138/72  02/19/23 128/60  A/P: reasonable control- continue current medications - not needing extra amlodipine  #hypothyroidism S: compliant On thyroid medication-levothyroxine 50 mcg Lab Results  Component Value Date   TSH 1.62 01/21/2023  A/P:stable- continue current medicines     Recommended follow up: Return for next already scheduled visit or sooner if needed. Future Appointments  Date Time Provider Department Center  05/31/2023 10:20 AM Shelva Majestic, MD [facility]  08/01/2023  9:00 AM [facility] ANNUAL WELLNESS VISIT 1 [facility]  08/01/2023 10:00 AM Shelva Majestic, MD [facility]    Lab/Order associations: No diagnosis found.  No orders of the defined types were placed in this encounter.   Return precautions advised.  Tana Conch, MD

## 2023-04-09 NOTE — Progress Notes (Signed)
[name] is a 82 y.o. female here for a new problem.  History of Present Illness:   No chief complaint on file.   HPI  Constipation Has been having constipation since    Past Medical History:  Diagnosis Date   Abnormal thyroid function test 07/01/2013   Anxiety    Arthritis    Cancer (HCC)    Coronary artery disease    Dermatitis 11/25/2013   Glaucoma 11/02/2012   HTN (hypertension) 11/02/2012   hx: breast cancer, IDC (Microinvasive) w DCIS, 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 - 07/10/2011   Hyperlipidemia    Hypothyroid    Obesity      Social History   Tobacco Use   Smoking status: Never    Passive exposure: Never   Smokeless tobacco: Never  Vaping Use   Vaping status: Never Used  Substance Use Topics   Alcohol use: Yes    Comment: Rare   Drug use: No    Past Surgical History:  Procedure Laterality Date   AUGMENTATION MAMMAPLASTY Left    05/2006   BREAST SURGERY  10/2005   Left Mastectomy, reconstruction, reduction on right. Dr. Odis Luster   CARDIAC CATHETERIZATION     COLONOSCOPY     CORONARY STENT INTERVENTION N/A 06/01/2019   Procedure: CORONARY STENT INTERVENTION;  Surgeon: Tonny Bollman, MD;  Location: [facility] INVASIVE CV LAB;  Service: Cardiovascular;  Laterality: N/A;   CRYOTHERAPY     GYN for CIN III   DILATION AND CURETTAGE OF UTERUS     EXCISION MORTON'S NEUROMA     right foot   EYE SURGERY Right 05/08/2018   LEFT HEART CATH AND CORONARY ANGIOGRAPHY N/A 06/01/2019   Procedure: LEFT HEART CATH AND CORONARY ANGIOGRAPHY;  Surgeon: Tonny Bollman, MD;  Location: [facility] INVASIVE CV LAB;  Service: Cardiovascular;  Laterality: N/A;   MASTECTOMY     REDUCTION MAMMAPLASTY Right     Family History  Problem Relation Age of Onset   Pneumonia Mother    Hyperlipidemia Mother    Parkinsonism Mother    CAD Mother    Other Father        bilateral subdural hematoma   Colon cancer Paternal Grandmother    Hyperlipidemia Sister    Prostate cancer Neg Hx    Breast cancer Neg Hx     Heart disease Neg Hx    Diabetes Neg Hx     Allergies  Allergen Reactions   Adhesive [Tape] Other (See Comments)    Causes skin to turn red where touched   Rosuvastatin Other (See Comments)    Joint & muscle aches, sleep issues.    Current Medications:   Current Outpatient Medications:    amLODipine (NORVASC) 2.5 MG tablet, Take 1-2 tablets (2.5-5 mg total) by mouth at bedtime. Take once daily unless blood pressure elevated above 150 then take twice daily, Disp: 180 tablet, Rfl: 3   Ascorbic Acid (VITAMIN C PO), Take by mouth. 1 gummy daily, Disp: , Rfl:    aspirin EC 81 MG tablet, Take 81 mg by mouth daily., Disp: , Rfl:    Bimatoprost (LUMIGAN OP), Place 1 drop into both eyes at bedtime. , Disp: , Rfl:    chlorthalidone (HYGROTON) 25 MG tablet, Take 1 tablet (25 mg total) by mouth daily. Pt needs to keep upcoming appt in Dec for further refills, Disp: 90 tablet, Rfl: 3   Cholecalciferol (VITAMIN D-3) 5000 UNITS TABS, Take 5,000 mg by mouth daily., Disp: , Rfl:    dorzolamide-timolol (COSOPT)  22.3-6.8 MG/ML ophthalmic solution, Place 1 drop into both eyes 2 (two) times daily. , Disp: , Rfl:    Evolocumab (REPATHA SURECLICK) 140 MG/ML SOAJ, INJECT 140MG  UNDER THE SKIN EVERY 2 WEEKS, Disp: 6 mL, Rfl: 3   famotidine (PEPCID) 20 MG tablet, TAKE 1 TABLET BY MOUTH TWICE DAILY., Disp: 180 tablet, Rfl: 3   icosapent Ethyl (VASCEPA) 1 g capsule, Take 1 capsule (1 g total) by mouth 2 (two) times daily., Disp: 180 capsule, Rfl: 1   levothyroxine (SYNTHROID) 50 MCG tablet, TAKE ONE TABLET BY MOUTH BEFORE BREAKFAST, Disp: 90 tablet, Rfl: 3   metoprolol tartrate (LOPRESSOR) 25 MG tablet, TAKE ONE TABLET BY MOUTH TWICE DAILY, Disp: 180 tablet, Rfl: 0   moxifloxacin (VIGAMOX) 0.5 % ophthalmic solution, Place into the right eye., Disp: , Rfl:    NEXLETOL 180 MG TABS, TAKE 1 TABLET EVERY DAY, Disp: 90 tablet, Rfl: 3   nitroGLYCERIN (NITROSTAT) 0.4 MG SL tablet, Place 1 tablet (0.4 mg total) under the  tongue every 5 (five) minutes x 3 doses as needed for chest pain. Pt needs to keep upcoming appt in Dec for further refills, Disp: 25 tablet, Rfl: 0   pilocarpine (PILOCAR) 1 % ophthalmic solution, Place 1 drop into both eyes 2 (two) times daily. , Disp: , Rfl:    Polyethyl Glycol-Propyl Glycol (SYSTANE OP), Place 1-2 drops into both eyes as needed (for dry eyes). , Disp: , Rfl:    potassium chloride (KLOR-CON) 10 MEQ tablet, Take 2 tablets (20 mEq total) by mouth daily., Disp: 180 tablet, Rfl: 3   prednisoLONE acetate (PRED FORTE) 1 % ophthalmic suspension, Place 1 drop into the right eye 4 (four) times daily., Disp: , Rfl:    Probiotic Product (PROBIOTIC DAILY) CAPS, Take 1 capsule by mouth daily. Reported on 10/10/2015, Disp: , Rfl:    Review of Systems:   ROS  Vitals:   There were no vitals filed for this visit.   There is no height or weight on file to calculate BMI.  Physical Exam:   Physical Exam  Assessment and Plan:   ***   I,Alexander Ruley,acting as a scribe for Jarold Motto, PA.,have documented all relevant documentation on the behalf of Jarold Motto, PA,as directed by  Jarold Motto, PA while in the presence of Jarold Motto, [place].   ***   Jarold Motto, PA-C

## 2023-04-10 ENCOUNTER — Encounter: Payer: Self-pay | Admitting: Physician Assistant

## 2023-04-10 ENCOUNTER — Ambulatory Visit: Payer: Medicare PPO | Admitting: Physician Assistant

## 2023-04-10 VITALS — BP 140/70 | HR 87 | Temp 97.5°F | Ht 63.0 in | Wt 147.2 lb

## 2023-04-10 DIAGNOSIS — K5900 Constipation, unspecified: Secondary | ICD-10-CM

## 2023-04-10 NOTE — Patient Instructions (Addendum)
It was great to see you!  To treat your constipation today: -Take 100 mg of docusate sodium (also known as Colace, however generic is fine!) by mouth twice daily to soften stools -Drink at least 64 oz of water daily -Add in 1 capful of polyethylene glycol (also known as Miralax, however generic is fine!) to beverage of choice daily  Goal is to have a formed, soft bowel movement regularly   -After a few days if no success, may increase to a total of 2 capfuls of polyethylene glycol/ Miralax daily  If still no results, please call the office    Take care,  Samantha Worley PA-C  

## 2023-04-12 ENCOUNTER — Ambulatory Visit (INDEPENDENT_AMBULATORY_CARE_PROVIDER_SITE_OTHER): Payer: Medicare PPO | Admitting: Physician Assistant

## 2023-04-12 ENCOUNTER — Encounter: Payer: Self-pay | Admitting: Physician Assistant

## 2023-04-12 VITALS — BP 138/70 | HR 70 | Temp 97.3°F | Ht 63.0 in | Wt 147.0 lb

## 2023-04-12 DIAGNOSIS — R350 Frequency of micturition: Secondary | ICD-10-CM | POA: Diagnosis not present

## 2023-04-12 DIAGNOSIS — K5900 Constipation, unspecified: Secondary | ICD-10-CM | POA: Diagnosis not present

## 2023-04-12 LAB — POC URINALSYSI DIPSTICK (AUTOMATED)
Bilirubin, UA: NEGATIVE
Blood, UA: NEGATIVE
Glucose, UA: NEGATIVE
Ketones, UA: NEGATIVE
Leukocytes, UA: NEGATIVE
Nitrite, UA: NEGATIVE
Protein, UA: NEGATIVE
Spec Grav, UA: 1.01 (ref 1.010–1.025)
Urobilinogen, UA: 0.2 E.U./dL
pH, UA: 6 (ref 5.0–8.0)

## 2023-04-12 NOTE — Progress Notes (Signed)
[name] is a 82 y.o. female here for a follow up of a pre-existing problem.  History of Present Illness:   Chief Complaint  Patient presents with   Constipation   Constipation:  She continues to complain of chronic constipation.  She states that her stool has been porridge like. Currently, her stool has been watery.  She denies having any urgency.  She's been taking Miralax and has an adequate intake of water.  She has been adding fiber through her diet to help relief symptoms.  She also reports that she lost her husband 5 years ago and that has caused her stress and prolonged grieving.   Urinary Frequency: She states that she has to wake up from her sleep about twice a night to urinate. Denies abdominal pain, dysuria, hematuria.   Past Medical History:  Diagnosis Date   Abnormal thyroid function test 07/01/2013   Anxiety    Arthritis    Cancer (HCC)    Coronary artery disease    Dermatitis 11/25/2013   Glaucoma 11/02/2012   HTN (hypertension) 11/02/2012   hx: breast cancer, IDC (Microinvasive) w DCIS, 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 - 07/10/2011   Hyperlipidemia    Hypothyroid    Obesity      Social History   Tobacco Use   Smoking status: Never    Passive exposure: Never   Smokeless tobacco: Never  Vaping Use   Vaping status: Never Used  Substance Use Topics   Alcohol use: Yes    Comment: Rare   Drug use: No    Past Surgical History:  Procedure Laterality Date   AUGMENTATION MAMMAPLASTY Left    05/2006   BREAST SURGERY  10/2005   Left Mastectomy, reconstruction, reduction on right. Dr. Odis Luster   CARDIAC CATHETERIZATION     COLONOSCOPY     CORONARY STENT INTERVENTION N/A 06/01/2019   Procedure: CORONARY STENT INTERVENTION;  Surgeon: Tonny Bollman, MD;  Location: [facility] INVASIVE CV LAB;  Service: Cardiovascular;  Laterality: N/A;   CRYOTHERAPY     GYN for CIN III   DILATION AND CURETTAGE OF UTERUS     EXCISION MORTON'S NEUROMA     right foot   EYE SURGERY Right  05/08/2018   LEFT HEART CATH AND CORONARY ANGIOGRAPHY N/A 06/01/2019   Procedure: LEFT HEART CATH AND CORONARY ANGIOGRAPHY;  Surgeon: Tonny Bollman, MD;  Location: [facility] INVASIVE CV LAB;  Service: Cardiovascular;  Laterality: N/A;   MASTECTOMY     REDUCTION MAMMAPLASTY Right     Family History  Problem Relation Age of Onset   Pneumonia Mother    Hyperlipidemia Mother    Parkinsonism Mother    CAD Mother    Other Father        bilateral subdural hematoma   Colon cancer Paternal Grandmother    Hyperlipidemia Sister    Prostate cancer Neg Hx    Breast cancer Neg Hx    Heart disease Neg Hx    Diabetes Neg Hx     Allergies  Allergen Reactions   Adhesive [Tape] Other (See Comments)    Causes skin to turn red where touched   Rosuvastatin Other (See Comments)    Joint & muscle aches, sleep issues.    Current Medications:   Current Outpatient Medications:    amLODipine (NORVASC) 2.5 MG tablet, Take 1-2 tablets (2.5-5 mg total) by mouth at bedtime. Take once daily unless blood pressure elevated above 150 then take twice daily, Disp: 180 tablet, Rfl: 3   Ascorbic Acid (  VITAMIN C PO), Take by mouth. 1 gummy daily, Disp: , Rfl:    aspirin EC 81 MG tablet, Take 81 mg by mouth daily., Disp: , Rfl:    Bimatoprost (LUMIGAN OP), Place 1 drop into both eyes at bedtime. , Disp: , Rfl:    chlorthalidone (HYGROTON) 25 MG tablet, Take 1 tablet (25 mg total) by mouth daily. Pt needs to keep upcoming appt in Dec for further refills, Disp: 90 tablet, Rfl: 3   Cholecalciferol (VITAMIN D-3) 5000 UNITS TABS, Take 5,000 mg by mouth daily., Disp: , Rfl:    dorzolamide-timolol (COSOPT) 22.3-6.8 MG/ML ophthalmic solution, Place 1 drop into both eyes 2 (two) times daily. , Disp: , Rfl:    Evolocumab (REPATHA SURECLICK) 140 MG/ML SOAJ, INJECT 140MG  UNDER THE SKIN EVERY 2 WEEKS, Disp: 6 mL, Rfl: 3   famotidine (PEPCID) 20 MG tablet, TAKE 1 TABLET BY MOUTH TWICE DAILY., Disp: 180 tablet, Rfl: 3   icosapent Ethyl  (VASCEPA) 1 g capsule, Take 1 capsule (1 g total) by mouth 2 (two) times daily., Disp: 180 capsule, Rfl: 1   levothyroxine (SYNTHROID) 50 MCG tablet, TAKE ONE TABLET BY MOUTH BEFORE BREAKFAST, Disp: 90 tablet, Rfl: 3   metoprolol tartrate (LOPRESSOR) 25 MG tablet, TAKE ONE TABLET BY MOUTH TWICE DAILY, Disp: 180 tablet, Rfl: 0   moxifloxacin (VIGAMOX) 0.5 % ophthalmic solution, Place into the right eye., Disp: , Rfl:    NEXLETOL 180 MG TABS, TAKE 1 TABLET EVERY DAY, Disp: 90 tablet, Rfl: 3   nitroGLYCERIN (NITROSTAT) 0.4 MG SL tablet, Place 1 tablet (0.4 mg total) under the tongue every 5 (five) minutes x 3 doses as needed for chest pain. Pt needs to keep upcoming appt in Dec for further refills, Disp: 25 tablet, Rfl: 0   pilocarpine (PILOCAR) 1 % ophthalmic solution, Place 1 drop into both eyes 2 (two) times daily. , Disp: , Rfl:    Polyethyl Glycol-Propyl Glycol (SYSTANE OP), Place 1-2 drops into both eyes as needed (for dry eyes). , Disp: , Rfl:    potassium chloride (KLOR-CON) 10 MEQ tablet, Take 2 tablets (20 mEq total) by mouth daily., Disp: 180 tablet, Rfl: 3   prednisoLONE acetate (PRED FORTE) 1 % ophthalmic suspension, Place 1 drop into the right eye 4 (four) times daily., Disp: , Rfl:    Probiotic Product (PROBIOTIC DAILY) CAPS, Take 1 capsule by mouth daily. Reported on 10/10/2015, Disp: , Rfl:    Review of Systems:   Review of Systems  Gastrointestinal:  Positive for constipation and diarrhea.  Genitourinary:  Positive for frequency.    Vitals:   Vitals:   04/12/23 1424  BP: 138/70  Pulse: 70  Temp: (!) 97.3 F (36.3 C)  TempSrc: Temporal  SpO2: 98%  Weight: 147 lb (66.7 kg)  Height: 5\' 3"  (1.6 m)     Body mass index is 26.04 kg/m.  Physical Exam:   Physical Exam Constitutional:      Appearance: Normal appearance. She is well-developed.  HENT:     Head: Normocephalic and atraumatic.  Eyes:     General: Lids are normal.     Extraocular Movements: Extraocular  movements intact.     Conjunctiva/sclera: Conjunctivae normal.  Pulmonary:     Effort: Pulmonary effort is normal.  Abdominal:     General: Abdomen is flat. Bowel sounds are normal.     Palpations: Abdomen is soft.     Tenderness: There is no abdominal tenderness.  Musculoskeletal:  General: Normal range of motion.     Cervical back: Normal range of motion and neck supple.  Skin:    General: Skin is warm and dry.  Neurological:     Mental Status: She is alert and oriented to person, place, and time.  Psychiatric:        Attention and Perception: Attention and perception normal.        Mood and Affect: Mood normal. Affect is tearful.        Behavior: Behavior normal.        Thought Content: Thought content normal.        Judgment: Judgment normal.    Results for orders placed or performed in visit on 04/12/23  POCT Urinalysis Dipstick (Automated)  Result Value Ref Range   Color, UA yellow    Clarity, UA clear    Glucose, UA Negative Negative   Bilirubin, UA Negative    Ketones, UA Negative    Spec Grav, UA 1.010 1.010 - 1.025   Blood, UA Negative    pH, UA 6.0 5.0 - 8.0   Protein, UA Negative Negative   Urobilinogen, UA 0.2 0.2 or 1.0 E.U./dL   Nitrite, UA Negative    Leukocytes, UA Negative Negative     Assessment and Plan:   Constipation, unspecified constipation type No red flags Recommend continued miralax - increase to 2 capfuls daily Call us Monday with update Low threshold to refer to gastroenterology if no change or any worsening/new symptoms Continue excellent hydration and fiber intake  Urinary frequency UA is wnl Suspect related to constipation Follow-up if new/worsening  I,Safa Kadhim, acting as a scribe for [employer], PA.,have documented all relevant documentation on the behalf of Jarold Motto, PA,as directed by  Jarold Motto, PA while in the presence of Jarold Motto, [place].   I, Jarold Motto, [place], have reviewed all  documentation for this visit. The documentation on 04/12/23 for the exam, diagnosis, procedures, and orders are all accurate and complete.   Jarold Motto, PA-C

## 2023-04-12 NOTE — Patient Instructions (Signed)
It was great to see you!  Increase to a total of 2 capfuls of polyethylene glycol/ Miralax daily  Message/call on Monday if any concerns.  Take care,  Jarold Motto PA-C

## 2023-04-15 ENCOUNTER — Telehealth: Payer: Self-pay | Admitting: Family Medicine

## 2023-04-15 NOTE — Telephone Encounter (Signed)
Pt stopped in concerned whether she should continue medication given last week. She has gone some but is worried she is getting constipated again.

## 2023-04-15 NOTE — Telephone Encounter (Signed)
Spoke to pt she said she is feeling better. Wants to know if she is too continue Miralax and stool softener? Pt said she moved her bowels today soft stool. Asked pt if taking Miralax 2 capfuls? Pt said yes, once in the AM with stool softener Colace and one in PM.Told pt I will check with Lelon Mast about the how much Miralax to use and get back to you. Pt verbalized understanding.

## 2023-04-15 NOTE — Telephone Encounter (Signed)
Spoke to pt told her per Lelon Mast, Stop the colace.  She can decrease Miralax to once daily.  I would like for her to continue to drink sufficient water.  Once having regular, formed soft bowel movements daily x a few days, then she can take a break from the Miralax and add back in as needed. Pt verbalized understanding.

## 2023-04-15 NOTE — Telephone Encounter (Signed)
Please see message and advise 

## 2023-04-22 ENCOUNTER — Other Ambulatory Visit: Payer: Self-pay | Admitting: Family Medicine

## 2023-04-22 DIAGNOSIS — Z1231 Encounter for screening mammogram for malignant neoplasm of breast: Secondary | ICD-10-CM

## 2023-04-25 ENCOUNTER — Encounter (INDEPENDENT_AMBULATORY_CARE_PROVIDER_SITE_OTHER): Payer: Self-pay

## 2023-04-29 ENCOUNTER — Ambulatory Visit: Payer: Medicare PPO | Admitting: Physician Assistant

## 2023-04-29 ENCOUNTER — Encounter: Payer: Self-pay | Admitting: Physician Assistant

## 2023-04-29 VITALS — BP 120/70 | HR 69 | Temp 97.8°F | Ht 63.0 in | Wt 145.4 lb

## 2023-04-29 DIAGNOSIS — F419 Anxiety disorder, unspecified: Secondary | ICD-10-CM | POA: Diagnosis not present

## 2023-04-29 DIAGNOSIS — R194 Change in bowel habit: Secondary | ICD-10-CM

## 2023-04-29 NOTE — Progress Notes (Signed)
[name] is a 82 y.o. female here for a follow up of a pre-existing problem.  History of Present Illness:   Chief Complaint  Patient presents with   Constipation    Pt still c/o constipation and wanting GI referral.    HPI  Constipation: She continues to complain of chronic constipation since visit on 8/2. Summary of 8/2 visited: described her stool as "porridge like" and watery. Taking Miralax and has an adequate intake of water. Adding fiber through her diet to help relieve symptoms. Denied urgency.    Reports compliance with all recommendations but still struggling with bowel movements, and has also begun experiencing occasional night sweats.  Notes she is experiencing increased anxiety due to acute onset of constipation with no resolution. She is also having thinner-caliber stools.  Presents today for a referral to Dr Claudette Head, MD with gastroenterology.  Wt Readings from Last 4 Encounters:  04/29/23 145 lb 6.1 oz (65.9 kg)  04/12/23 147 lb (66.7 kg)  04/10/23 147 lb 4 oz (66.8 kg)  03/26/23 147 lb 9.6 oz (67 kg)     Past Medical History:  Diagnosis Date   Abnormal thyroid function test 07/01/2013   Anxiety    Arthritis    Cancer (HCC)    Coronary artery disease    Dermatitis 11/25/2013   Glaucoma 11/02/2012   HTN (hypertension) 11/02/2012   hx: breast cancer, IDC (Microinvasive) w DCIS, receptor + her 2 - 07/10/2011   Hyperlipidemia    Hypothyroid    Obesity      Social History   Tobacco Use   Smoking status: Never    Passive exposure: Never   Smokeless tobacco: Never  Vaping Use   Vaping status: Never Used  Substance Use Topics   Alcohol use: Yes    Comment: Rare   Drug use: No    Past Surgical History:  Procedure Laterality Date   AUGMENTATION MAMMAPLASTY Left    05/2006   BREAST SURGERY  10/2005   Left Mastectomy, reconstruction, reduction on right. Dr. Odis Luster   CARDIAC CATHETERIZATION     COLONOSCOPY     CORONARY STENT INTERVENTION  N/A 06/01/2019   Procedure: CORONARY STENT INTERVENTION;  Surgeon: Tonny Bollman, MD;  Location: [facility] INVASIVE CV LAB;  Service: Cardiovascular;  Laterality: N/A;   CRYOTHERAPY     GYN for CIN III   DILATION AND CURETTAGE OF UTERUS     EXCISION MORTON'S NEUROMA     right foot   EYE SURGERY Right 05/08/2018   LEFT HEART CATH AND CORONARY ANGIOGRAPHY N/A 06/01/2019   Procedure: LEFT HEART CATH AND CORONARY ANGIOGRAPHY;  Surgeon: Tonny Bollman, MD;  Location: [facility] INVASIVE CV LAB;  Service: Cardiovascular;  Laterality: N/A;   MASTECTOMY     REDUCTION MAMMAPLASTY Right     Family History  Problem Relation Age of Onset   Pneumonia Mother    Hyperlipidemia Mother    Parkinsonism Mother    CAD Mother    Other Father        bilateral subdural hematoma   Colon cancer Paternal Grandmother    Hyperlipidemia Sister    Prostate cancer Neg Hx    Breast cancer Neg Hx    Heart disease Neg Hx    Diabetes Neg Hx     Allergies  Allergen Reactions   Adhesive [Tape] Other (See Comments)    Causes skin to turn red where touched   Rosuvastatin Other (See Comments)    Joint & muscle aches, sleep  issues.    Current Medications:   Current Outpatient Medications:    amLODipine (NORVASC) 2.5 MG tablet, Take 1-2 tablets (2.5-5 mg total) by mouth at bedtime. Take once daily unless blood pressure elevated above 150 then take twice daily, Disp: 180 tablet, Rfl: 3   Ascorbic Acid (VITAMIN C PO), Take by mouth. 1 gummy daily, Disp: , Rfl:    aspirin EC 81 MG tablet, Take 81 mg by mouth daily., Disp: , Rfl:    Bimatoprost (LUMIGAN OP), Place 1 drop into both eyes at bedtime. , Disp: , Rfl:    chlorthalidone (HYGROTON) 25 MG tablet, Take 1 tablet (25 mg total) by mouth daily. Pt needs to keep upcoming appt in Dec for further refills, Disp: 90 tablet, Rfl: 3   Cholecalciferol (VITAMIN D-3) 5000 UNITS TABS, Take 5,000 mg by mouth daily., Disp: , Rfl:    dorzolamide-timolol (COSOPT) 22.3-6.8 MG/ML ophthalmic  solution, Place 1 drop into both eyes 2 (two) times daily. , Disp: , Rfl:    Evolocumab (REPATHA SURECLICK) 140 MG/ML SOAJ, INJECT 140MG  UNDER THE SKIN EVERY 2 WEEKS, Disp: 6 mL, Rfl: 3   famotidine (PEPCID) 20 MG tablet, TAKE 1 TABLET BY MOUTH TWICE DAILY., Disp: 180 tablet, Rfl: 3   icosapent Ethyl (VASCEPA) 1 g capsule, Take 1 capsule (1 g total) by mouth 2 (two) times daily., Disp: 180 capsule, Rfl: 1   levothyroxine (SYNTHROID) 50 MCG tablet, TAKE ONE TABLET BY MOUTH BEFORE BREAKFAST, Disp: 90 tablet, Rfl: 3   metoprolol tartrate (LOPRESSOR) 25 MG tablet, TAKE ONE TABLET BY MOUTH TWICE DAILY, Disp: 180 tablet, Rfl: 0   moxifloxacin (VIGAMOX) 0.5 % ophthalmic solution, Place into the right eye., Disp: , Rfl:    NEXLETOL 180 MG TABS, TAKE 1 TABLET EVERY DAY, Disp: 90 tablet, Rfl: 3   nitroGLYCERIN (NITROSTAT) 0.4 MG SL tablet, Place 1 tablet (0.4 mg total) under the tongue every 5 (five) minutes x 3 doses as needed for chest pain. Pt needs to keep upcoming appt in Dec for further refills, Disp: 25 tablet, Rfl: 0   pilocarpine (PILOCAR) 1 % ophthalmic solution, Place 1 drop into both eyes 2 (two) times daily. , Disp: , Rfl:    Polyethyl Glycol-Propyl Glycol (SYSTANE OP), Place 1-2 drops into both eyes as needed (for dry eyes). , Disp: , Rfl:    potassium chloride (KLOR-CON) 10 MEQ tablet, Take 2 tablets (20 mEq total) by mouth daily., Disp: 180 tablet, Rfl: 3   prednisoLONE acetate (PRED FORTE) 1 % ophthalmic suspension, Place 1 drop into the right eye 4 (four) times daily., Disp: , Rfl:    Probiotic Product (PROBIOTIC DAILY) CAPS, Take 1 capsule by mouth daily. Reported on 10/10/2015, Disp: , Rfl:    Review of Systems:   Review of Systems  Constitutional:        (+) Night sweats (occasional, acute onset since 8/2)  Gastrointestinal:  Positive for constipation (persistent since 8/2).  Psychiatric/Behavioral:  The patient is nervous/anxious (exacerbated by current health issues).     Vitals:    Vitals:   04/29/23 1129  BP: 120/70  Pulse: 69  Temp: 97.8 F (36.6 C)  TempSrc: Temporal  SpO2: 97%  Weight: 145 lb 6.1 oz (65.9 kg)  Height: 5\' 3"  (1.6 m)     Body mass index is 25.75 kg/m.  Physical Exam:   Physical Exam Vitals and nursing note reviewed. Exam conducted with a chaperone present.  Constitutional:      General: She is not  in acute distress.    Appearance: She is well-developed. She is not ill-appearing or toxic-appearing.  Cardiovascular:     Rate and Rhythm: Normal rate and regular rhythm.     Pulses: Normal pulses.     Heart sounds: Normal heart sounds, S1 normal and S2 normal.  Pulmonary:     Effort: Pulmonary effort is normal.     Breath sounds: Normal breath sounds.  Abdominal:     General: Abdomen is flat. Bowel sounds are normal.     Palpations: Abdomen is soft.     Tenderness: There is no abdominal tenderness.  Genitourinary:    Vagina: Normal.     Rectum: Normal.  Skin:    General: Skin is warm and dry.  Neurological:     Mental Status: She is alert.     GCS: GCS eye subscore is 4. GCS verbal subscore is 5. GCS motor subscore is 6.  Psychiatric:        Attention and Perception: Attention normal.        Mood and Affect: Mood is anxious.        Speech: Speech normal.        Behavior: Behavior normal. Behavior is cooperative.     Assessment and Plan:   Bowel habit changes Ongoing No evidence of acute abdomen on my exam I have placed referral to gastroenterology for further evaluation Did briefly discuss possibility of getting CT abdomen/pel with PCP however he is going to see patient tomorrow and will defer this to his clinical judgment Continue bowel regimen  Anxiety Ongoing Tried to provide emotional support as much as able Thankfully able to get her in tomorrow with PCP to discuss this No SI/HI on my exam today   I,Emily Lagle,acting as a scribe for [employer], PA.,have documented all relevant documentation on the  behalf of Jarold Motto, PA,as directed by  Jarold Motto, PA while in the presence of Jarold Motto, [place].  I, Jarold Motto, [place], have reviewed all documentation for this visit. The documentation on 04/29/23 for the exam, diagnosis, procedures, and orders are all accurate and complete.  Jarold Motto, PA-C

## 2023-04-29 NOTE — Patient Instructions (Signed)
It was great to see you!  I've placed referral for gastroenterology I will talk to [facility] at lunch and have Lupita Leash give you a call with the plan  Try not to worry -you are doing all the right things!  Take care,  Jarold Motto PA-C

## 2023-04-30 ENCOUNTER — Ambulatory Visit: Payer: Medicare PPO | Admitting: Family Medicine

## 2023-04-30 ENCOUNTER — Encounter: Payer: Self-pay | Admitting: Family Medicine

## 2023-04-30 ENCOUNTER — Other Ambulatory Visit: Payer: Self-pay | Admitting: Family Medicine

## 2023-04-30 VITALS — BP 144/60 | HR 66 | Temp 98.0°F | Ht 63.0 in | Wt 145.8 lb

## 2023-04-30 DIAGNOSIS — I10 Essential (primary) hypertension: Secondary | ICD-10-CM

## 2023-04-30 DIAGNOSIS — R140 Abdominal distension (gaseous): Secondary | ICD-10-CM | POA: Diagnosis not present

## 2023-04-30 DIAGNOSIS — E039 Hypothyroidism, unspecified: Secondary | ICD-10-CM | POA: Diagnosis not present

## 2023-04-30 DIAGNOSIS — K5900 Constipation, unspecified: Secondary | ICD-10-CM | POA: Diagnosis not present

## 2023-04-30 NOTE — Patient Instructions (Addendum)
Take miralax each morning and take another dose in the evening for the next week and then update me  Recommended follow up: Return for next already scheduled visit or sooner if needed.

## 2023-04-30 NOTE — Progress Notes (Signed)
Phone [phone] In person visit   Subjective:   [name] is a 82 y.o. year old very pleasant female patient who presents for/with See problem oriented charting Chief Complaint  Patient presents with   contsiptation   Past Medical History-  Patient Active Problem List   Diagnosis Date Noted   CAD (coronary artery disease) s/p stent mCx 05/2019 06/02/2019    Priority: High   Grieving 02/18/2017    Priority: High   Osteoporosis 11/05/2016    Priority: High   Anxiety 11/27/2022    Priority: Medium    Aortic atherosclerosis (HCC) 09/15/2020    Priority: Medium    Chest pain 12/29/2018    Priority: Medium    Primary open-angle glaucoma 11/20/2017    Priority: Medium    Hypothyroidism 07/01/2013    Priority: Medium    Hypertension 11/02/2012    Priority: Medium    Hyperlipidemia 07/24/2011    Priority: Medium    hx: breast cancer, IDC (Microinvasive) w DCIS, 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 Take once daily unless blood pressure elevated above 150 then take twice daily 180 tablet 3   Ascorbic Acid (VITAMIN C PO) Take by mouth. 1 gummy daily     aspirin EC 81 MG tablet Take 81 mg by mouth daily.     Bimatoprost (LUMIGAN OP) Place 1 drop into both eyes at bedtime.      chlorthalidone (HYGROTON) 25 MG tablet Take 1 tablet (25 mg total) by mouth daily. Pt needs to keep upcoming appt in Dec for further refills 90 tablet 3   Cholecalciferol (VITAMIN D-3) 5000 UNITS TABS Take 5,000 mg by mouth  daily.     dorzolamide-timolol (COSOPT) 22.3-6.8 MG/ML ophthalmic solution Place 1 drop into both eyes 2 (two) times daily.      Evolocumab (REPATHA SURECLICK) 140 MG/ML SOAJ INJECT 140MG  UNDER THE SKIN EVERY 2 WEEKS 6 mL 3   famotidine (PEPCID) 20 MG tablet TAKE 1 TABLET BY MOUTH TWICE DAILY. 180 tablet 3   icosapent Ethyl (VASCEPA) 1 g capsule Take 1 capsule (1 g total) by mouth 2 (two) times daily. 180 capsule 1   levothyroxine (SYNTHROID) 50 MCG tablet TAKE ONE TABLET BY MOUTH BEFORE BREAKFAST 90 tablet 3   metoprolol tartrate (LOPRESSOR) 25 MG tablet TAKE ONE TABLET BY MOUTH TWICE DAILY 180 tablet 0   moxifloxacin (VIGAMOX) 0.5 % ophthalmic solution Place into the right eye.     NEXLETOL 180 MG TABS TAKE 1 TABLET EVERY DAY 90 tablet 3   nitroGLYCERIN (NITROSTAT) 0.4 MG SL tablet Place 1 tablet (0.4 mg total) under the tongue every 5 (five) minutes x 3 doses as needed for chest pain. Pt needs to keep upcoming appt in Dec for further refills 25 tablet 0   pilocarpine (PILOCAR) 1 % ophthalmic solution Place 1 drop into both eyes 2 (two) times daily.      Polyethyl Glycol-Propyl Glycol (SYSTANE OP) Place 1-2 drops into both eyes  as needed (for dry eyes).      potassium chloride (KLOR-CON) 10 MEQ tablet Take 2 tablets (20 mEq total) by mouth daily. 180 tablet 3   prednisoLONE acetate (PRED FORTE) 1 % ophthalmic suspension Place 1 drop into the right eye 4 (four) times daily.     Probiotic Product (PROBIOTIC DAILY) CAPS Take 1 capsule by mouth daily. Reported on 10/10/2015     No current facility-administered medications for this visit.     Objective:  BP (!) 144/60   Pulse 66   Temp 98 F (36.7 C)   Ht 5\' 3"  (1.6 m)   Wt 145 lb 12.8 oz (66.1 kg)   SpO2 98%   BMI 25.83 kg/m  Gen: NAD, resting comfortably CV: RRR no murmurs rubs or gallops Lungs: CTAB no crackles, wheeze, rhonchi Abdomen: soft/nontender/nondistended/normal bowel sounds. No rebound or guarding. She feels bloated but I do  not note substantial distension as above. Ext: no edema Skin: warm, dry    Assessment and Plan   # Constipation S: Patient started seeing Jarold Motto on 04/10/2023 complaining of constipation since 03/27/2023.  Was taking MiraLAX at that time with some relief but stools remained hard.  She felt bloated.  She previously had a fall back in June and had not been as mobile since that time.  Reported low fiber and water intake. -She has some anxiety is paternal grandmother and nephew died of colon cancer and  she has a family history of rectal cancer-her last colonoscopy was 09/22/2018 with Dr. Russella Dar with 2 adenomas but at her age colonoscopies were discontinued.  -On initial visit was advised to take Colace, drink 64 ounces of water, take MiraLAX 1 capful daily and increase to 2 capfuls daily if needed -she reports at first visit was straining a fair amount.  -still riding bike and walking on treadmill  She had a return visit on 04-12-23 and stools have become more porridge/slightly formed like without urgency but still frequency noted as daily- just small volume which concerned her .  She was taking MiraLAX, adding fiber.  MiraLAX was increased to 2 capfuls Bbut she did not increase this- has only been on one capful). She was taken off of colace, was drinking 64 oz of water. Continued to have most days small slightly formed bowel movement- occasionally did not have bowel movement. Occasional brown water stool. No blood in stool- occasionally had with wiping.  No melena.   -since first visit not straining. No hard stools. Still having small stools.   She was seen yesterday and noted ongoing difficulty with bowel movements and occasional night sweats.  She was feeling very anxious about this.  Felt like she had thinner caliber stools if they were formed.  She feels bloated. She was referred to gastroenterology. Weight down  2 lbs from when I last saw her a month ago (she did not have constipation at that  time). She still feels bloated  Years ago was told had spastic colon but has not had similar issues to present A/P: patient presented with less frequent bowel movements, harder stools, straining in late July without red flags- that has essentially resolved but she is having small volume stools now occasionally smaller caliber along with bloating. Benign abdominal exam.  -the bigger issue here I suspect is that anxiety is playing a role with a physical symptom becoming something she  perseverates on-it brings back reminders of missing her now deceased husband from 6 years ago -spent extended period  comforting patient but after visit reflected that when current symptoms settle we likely need to look at something like ssri or buspirone - I do think its possible she is still backed up some causing bloating and we opted to push her miralax to twice daily (she never made this change previously as planned) -encouraged her to schedule follow up with gastroenterology but if symptoms resolve she can cancel (with change in bowel movement we are asking for their opinion on repeat colonoscopy though reassuring exam in 2020). No current red flags such as pain to suggest CT scan  #Hypertension- mildly elevated today but a lot of anxiety today- will recheck at September visit. Continue chlorthalidone 25 mg and amlodipine 2.5 mg for now, as well as metoproll 25 mg twice daily    #hypothyroidism-well controlled on last check- if persistent issues at follow up we will update TSH though doubt that's cause of constipation. Continue levothyroxine 50 mcg daily Lab Results  Component Value Date   TSH 1.62 01/21/2023   Recommended follow up: Return for next already scheduled visit or sooner if needed. Future Appointments  Date Time Provider Department Center  05/23/2023  7:10 AM [facility] MM 2 [facility]  05/31/2023 10:20 AM Shelva Majestic, MD [facility]  08/01/2023  9:00 AM [facility] ANNUAL WELLNESS VISIT 1  [facility]  08/01/2023 10:00 AM Durene Cal Aldine Contes, MD [facility]   Lab/Order associations:   ICD-10-CM   1. Constipation, unspecified constipation type  K59.00     2. Bloating  R14.0     3. Primary hypertension  I10     4. Hypothyroidism, unspecified type  E03.9      Return precautions advised.  Tana Conch, MD

## 2023-05-01 ENCOUNTER — Encounter: Payer: Self-pay | Admitting: Physician Assistant

## 2023-05-07 ENCOUNTER — Telehealth: Payer: Self-pay | Admitting: Family Medicine

## 2023-05-07 NOTE — Telephone Encounter (Signed)
Pt states she was able to go a little bit more with a slight formation. GI appt was moved up to 05/10/23 with Dr. Ardell Isaacs PA. She will update you after this visit. She also wanted to express how much she appreciates Dr. Erasmo Leventhal help.

## 2023-05-07 NOTE — Telephone Encounter (Signed)
Please have her continue the MiraLAX twice daily-as long as no diarrhea

## 2023-05-07 NOTE — Telephone Encounter (Signed)
FYI

## 2023-05-10 ENCOUNTER — Ambulatory Visit: Payer: Medicare PPO | Admitting: Physician Assistant

## 2023-05-10 ENCOUNTER — Encounter: Payer: Self-pay | Admitting: Physician Assistant

## 2023-05-10 VITALS — BP 130/68 | HR 78 | Ht 63.0 in | Wt 144.0 lb

## 2023-05-10 DIAGNOSIS — R194 Change in bowel habit: Secondary | ICD-10-CM | POA: Diagnosis not present

## 2023-05-10 NOTE — Patient Instructions (Signed)
Start Benefiber 2 teaspoons in 8 ounces of liquid daily.  Continue Miralax daily.   _______________________________________________________  If your blood pressure at your visit was 140/90 or greater, please contact your primary care physician to follow up on this.  _______________________________________________________  If you are age 82 or older, your body mass index should be between 23-30. Your Body mass index is 25.51 kg/m. If this is out of the aforementioned range listed, please consider follow up with your Primary Care Provider.  If you are age 19 or younger, your body mass index should be between 19-25. Your Body mass index is 25.51 kg/m. If this is out of the aformentioned range listed, please consider follow up with your Primary Care Provider.   ________________________________________________________  The [facility] providers would like to encourage you to use [facility] to communicate with providers for non-urgent requests or questions.  Due to long hold times on the telephone, sending your provider a message by [facility] may be a faster and more efficient way to get a response.  Please allow 48 business hours for a response.  Please remember that this is for non-urgent requests.  _______________________________________________________

## 2023-05-10 NOTE — Progress Notes (Signed)
Chief Complaint: Bowel habit changes  HPI:    [name] is an 82 year old female with a past medical history as listed below including CAD (12/30/2018 echo with LVEF 60-65%), hypertension and multiple others, known to Dr. Russella Dar, who was referred to me by Shelva Majestic, MD for a complaint of bowel habit changes.    09/22/2018 colonoscopy for history of adenomatous polyps with three 6-7 mm polyps in the sigmoid and transverse colon as well as mild diverticulosis in the sigmoid colon.  Pathology showed tubular adenomas.  No repeat due to age.    01/21/2023 TSH and CBC normal and CMP normal.    04/30/2023 patient seen in clinic by PCP for constipation.  At that time discussed she been complaining of constipation for at least 2 weeks, was taking MiraLAX with some relief but still hard stools and bloated.  Noted some anxiety given paternal grandmother nephew died of colon cancer.  On initial visit advised to take Colace, drink 64 ounces of water and take MiraLAX 1 capful a day and increase to 2 capsules if needed.  At the next visit she added fiber.  Continue to feel anxious.  At last visit MiraLAX increased to twice daily.    Today, the patient tells me that she has been through a lot of stress over the past few years noting that she lost her husband about 5 years ago and he was an OB/GYN so he used to "fix all these problems".  Tells me back in the beginning of June she had a fall out of her house and got quite bruised up and then over the next month or so she started to get constipated.  She saw her PCP as above on multiple occasions and was started on MiraLAX this was titrated up to twice daily and she did start having more bowel movements but they were still somewhat loose and "abnormal looking", now just over the past week or so she has noticed that they are starting to form up so she has decreased the MiraLAX to once a day.  She never started fiber as instructed but has increased water intake.     Denies fever, chills, weight loss, abdominal pain or hematochezia.  Past Medical History:  Diagnosis Date   Abnormal thyroid function test 07/01/2013   Anxiety    Arthritis    Cancer (HCC)    Coronary artery disease    Dermatitis 11/25/2013   Glaucoma 11/02/2012   HTN (hypertension) 11/02/2012   hx: breast cancer, IDC (Microinvasive) w DCIS, receptor + her 2 - 07/10/2011   Hyperlipidemia    Hypothyroid    Obesity     Past Surgical History:  Procedure Laterality Date   AUGMENTATION MAMMAPLASTY Left    05/2006   BREAST SURGERY  10/2005   Left Mastectomy, reconstruction, reduction on right. Dr. Odis Luster   CARDIAC CATHETERIZATION     COLONOSCOPY     CORONARY STENT INTERVENTION N/A 06/01/2019   Procedure: CORONARY STENT INTERVENTION;  Surgeon: Tonny Bollman, MD;  Location: [facility] INVASIVE CV LAB;  Service: Cardiovascular;  Laterality: N/A;   CRYOTHERAPY     GYN for CIN III   DILATION AND CURETTAGE OF UTERUS     EXCISION MORTON'S NEUROMA     right foot   EYE SURGERY Right 05/08/2018   LEFT HEART CATH AND CORONARY ANGIOGRAPHY N/A 06/01/2019   Procedure: LEFT HEART CATH AND CORONARY ANGIOGRAPHY;  Surgeon: Tonny Bollman, MD;  Location: [facility] INVASIVE CV LAB;  Service: Cardiovascular;  Laterality: N/A;   MASTECTOMY     REDUCTION MAMMAPLASTY Right     Current Outpatient Medications  Medication Sig Dispense Refill   amLODipine (NORVASC) 2.5 MG tablet Take 1-2 tablets (2.5-5 mg total) by mouth at bedtime. Take once daily unless blood pressure elevated above 150 then take twice daily 180 tablet 3   Ascorbic Acid (VITAMIN C PO) Take by mouth. 1 gummy daily     aspirin EC 81 MG tablet Take 81 mg by mouth daily.     Bimatoprost (LUMIGAN OP) Place 1 drop into both eyes at bedtime.      chlorthalidone (HYGROTON) 25 MG tablet Take 1 tablet (25 mg total) by mouth daily. Pt needs to keep upcoming appt in Dec for further refills 90 tablet 3   Cholecalciferol (VITAMIN D-3) 5000 UNITS TABS Take 5,000 mg  by mouth daily.     dorzolamide-timolol (COSOPT) 22.3-6.8 MG/ML ophthalmic solution Place 1 drop into both eyes 2 (two) times daily.      Evolocumab (REPATHA SURECLICK) 140 MG/ML SOAJ INJECT 140MG  UNDER THE SKIN EVERY 2 WEEKS 6 mL 3   famotidine (PEPCID) 20 MG tablet TAKE 1 TABLET BY MOUTH TWICE DAILY. 180 tablet 3   icosapent Ethyl (VASCEPA) 1 g capsule Take 1 capsule (1 g total) by mouth 2 (two) times daily. 180 capsule 1   levothyroxine (SYNTHROID) 50 MCG tablet TAKE ONE TABLET BY MOUTH BEFORE BREAKFAST 90 tablet 3   metoprolol tartrate (LOPRESSOR) 25 MG tablet TAKE ONE TABLET BY MOUTH TWICE DAILY 180 tablet 0   moxifloxacin (VIGAMOX) 0.5 % ophthalmic solution Place into the right eye.     NEXLETOL 180 MG TABS TAKE 1 TABLET EVERY DAY 90 tablet 3   nitroGLYCERIN (NITROSTAT) 0.4 MG SL tablet Place 1 tablet (0.4 mg total) under the tongue every 5 (five) minutes x 3 doses as needed for chest pain. Pt needs to keep upcoming appt in Dec for further refills 25 tablet 0   pilocarpine (PILOCAR) 1 % ophthalmic solution Place 1 drop into both eyes 2 (two) times daily.      Polyethyl Glycol-Propyl Glycol (SYSTANE OP) Place 1-2 drops into both eyes as needed (for dry eyes).      potassium chloride (KLOR-CON) 10 MEQ tablet Take 2 tablets (20 mEq total) by mouth daily. 180 tablet 3   prednisoLONE acetate (PRED FORTE) 1 % ophthalmic suspension Place 1 drop into the right eye 4 (four) times daily.     Probiotic Product (PROBIOTIC DAILY) CAPS Take 1 capsule by mouth daily. Reported on 10/10/2015     No current facility-administered medications for this visit.    Allergies as of 05/10/2023 - Review Complete 04/30/2023  Allergen Reaction Noted   Adhesive [tape] Other (See Comments) 07/12/2011   Rosuvastatin Other (See Comments) 04/16/2012    Family History  Problem Relation Age of Onset   Pneumonia Mother    Hyperlipidemia Mother    Parkinsonism Mother    CAD Mother    Other Father        bilateral  subdural hematoma   Colon cancer Paternal Grandmother    Hyperlipidemia Sister    Prostate cancer Neg Hx    Breast cancer Neg Hx    Heart disease Neg Hx    Diabetes Neg Hx     Social History   Socioeconomic History   Marital status: Widowed    Spouse name: Not on file   Number of children: 1   Years of education: Not on file  Highest education level: Not on file  Occupational History   Occupation: retired  Tobacco Use   Smoking status: Never    Passive exposure: Never   Smokeless tobacco: Never  Vaping Use   Vaping status: Never Used  Substance and Sexual Activity   Alcohol use: Yes    Comment: Rare   Drug use: No   Sexual activity: Not on file  Other Topics Concern   Not on file  Social History Narrative   Widowed 2019- lives alone. Was  Married 1981. 1 daughter, 2 step sons. 4 grandchildren      Midwife      Hobbies: going out to eat, activities with church including cancer support group at church   Social Determinants of Health   Financial Resource Strain: Low Risk  (07/19/2022)   Overall Financial Resource Strain (CARDIA)    Difficulty of Paying Living Expenses: Not hard at all  Food Insecurity: No Food Insecurity (09/13/2022)   Hunger Vital Sign    Worried About Running Out of Food in the Last Year: Never true    Ran Out of Food in the Last Year: Never true  Transportation Needs: No Transportation Needs (09/13/2022)   PRAPARE - Administrator, Civil Service (Medical): No    Lack of Transportation (Non-Medical): No  Physical Activity: Inactive (07/19/2022)   Exercise Vital Sign    Days of Exercise per Week: 0 days    Minutes of Exercise per Session: 0 min  Stress: No Stress Concern Present (07/19/2022)   [employer] of Occupational Health - Occupational Stress Questionnaire    Feeling of Stress : Only a little  Social Connections: Moderately Integrated (07/19/2022)   Social Connection and Isolation Panel [NHANES]    Frequency of  Communication with Friends and Family: More than three times a week    Frequency of Social Gatherings with Friends and Family: Three times a week    Attends Religious Services: More than 4 times per year    Active Member of Clubs or Organizations: Yes    Attends Banker Meetings: 1 to 4 times per year    Marital Status: Widowed  Intimate Partner Violence: Not At Risk (07/19/2022)   Humiliation, Afraid, Rape, and Kick questionnaire    Fear of Current or Ex-Partner: No    Emotionally Abused: No    Physically Abused: No    Sexually Abused: No    Review of Systems:    Constitutional: No weight loss, fever or chills Skin: No rash  Cardiovascular: No chest pain Respiratory: No SOB Gastrointestinal: See HPI and otherwise negative Genitourinary: No dysuria Neurological: No headache, dizziness or syncope Musculoskeletal: No new muscle or joint pain Hematologic: No bruising Psychiatric: No history of depression or anxiety   Physical Exam:  Vital signs: BP 130/68   Pulse 78   Ht 5\' 3"  (1.6 m)   Wt 144 lb (65.3 kg)   BMI 25.51 kg/m    Constitutional:   Pleasant Elderly Caucasian female appears to be in NAD, Well developed, Well nourished, alert and cooperative Head:  Normocephalic and atraumatic. Eyes:   PEERL, EOMI. No icterus. Conjunctiva pink. Ears:  Normal auditory acuity. Neck:  Supple Throat: Oral cavity and pharynx without inflammation, swelling or lesion.  Respiratory: Respirations even and unlabored. Lungs clear to auscultation bilaterally.   No wheezes, crackles, or rhonchi.  Cardiovascular: Normal S1, S2. No MRG. Regular rate and rhythm. No peripheral edema, cyanosis or pallor.  Gastrointestinal:  Soft, nondistended,  nontender. No rebound or guarding. Normal bowel sounds. No appreciable masses or hepatomegaly. Rectal:  Not performed.  Msk:  Symmetrical without gross deformities. Without edema, no deformity or joint abnormality.  Neurologic:  Alert and   oriented x4;  grossly normal neurologically.  Skin:   Dry and intact without significant lesions or rashes. Psychiatric: Demonstrates good judgement and reason without abnormal affect or behaviors.  RELEVANT LABS AND IMAGING: CBC    Component Value Date/Time   WBC 7.5 01/21/2023 1111   RBC 4.79 01/21/2023 1111   HGB 14.4 01/21/2023 1111   HGB 13.7 11/08/2021 1231   HGB 13.2 12/28/2010 1333   HCT 43.4 01/21/2023 1111   HCT 42.2 11/08/2021 1231   HCT 41.7 12/28/2010 1333   PLT 324.0 01/21/2023 1111   PLT 360 11/08/2021 1231   MCV 90.6 01/21/2023 1111   MCV 88 11/08/2021 1231   MCV 88.5 12/28/2010 1333   MCH 29.5 11/25/2022 1139   MCHC 33.2 01/21/2023 1111   RDW 13.2 01/21/2023 1111   RDW 12.0 11/08/2021 1231   RDW 12.6 12/28/2010 1333   LYMPHSABS 1.8 01/21/2023 1111   LYMPHSABS 1.8 12/28/2010 1333   MONOABS 0.6 01/21/2023 1111   MONOABS 0.5 12/28/2010 1333   EOSABS 0.1 01/21/2023 1111   EOSABS 0.1 12/28/2010 1333   BASOSABS 0.1 01/21/2023 1111   BASOSABS 0.1 12/28/2010 1333    CMP     Component Value Date/Time   NA 141 01/21/2023 1111   NA 143 11/08/2021 1231   K 3.8 01/21/2023 1111   CL 100 01/21/2023 1111   CO2 30 01/21/2023 1111   GLUCOSE 85 01/21/2023 1111   BUN 22 01/21/2023 1111   BUN 25 11/08/2021 1231   CREATININE 0.98 01/21/2023 1111   CREATININE 0.85 05/12/2020 1136   CALCIUM 10.0 01/21/2023 1111   PROT 6.6 01/21/2023 1111   PROT 6.5 09/21/2019 0908   ALBUMIN 4.5 01/21/2023 1111   ALBUMIN 4.6 09/21/2019 0908   AST 17 01/21/2023 1111   ALT 17 01/21/2023 1111   ALKPHOS 41 01/21/2023 1111   BILITOT 1.1 01/21/2023 1111   BILITOT 0.5 09/21/2019 0908   GFRNONAA >60 11/25/2022 1139   GFRNONAA 66 05/12/2020 1136   GFRAA 72 08/19/2020 1438   GFRAA 76 05/12/2020 1136    Assessment: 1.  Change in bowel habits: Towards constipation over the past 2 to 3 months, now better after addition of MiraLAX up to twice daily and increase in water intake, now  starting to form back up and appear more normal over the past week, colonoscopy in 2020 with 2 adenomatous polyps and otherwise normal; consider relation to medications that she is on versus IBS given anxiety and stress  Plan: 1.  At this point stools are starting to return to normal.  We will hold off on colonoscopy.  Discussed with her that we would only do this for any red flag symptoms including a consistent change in bowel habits, abdominal pain, significant weight loss or blood in her stool. 2.  Continue MiraLAX once daily, discussed titration of this in the future. 3.  Start a fiber supplement such as Metamucil, Citrucel or Benefiber.  Continue increased water intake. 4.  Patient will follow-up with me in 2 to 3 months.  At that time can see how she is doing.  Hyacinth Meeker, PA-C [facility] 05/10/2023, 8:28 AM  Cc: Shelva Majestic, MD

## 2023-05-20 ENCOUNTER — Other Ambulatory Visit: Payer: Self-pay | Admitting: Family Medicine

## 2023-05-23 ENCOUNTER — Ambulatory Visit
Admission: RE | Admit: 2023-05-23 | Discharge: 2023-05-23 | Disposition: A | Discharge status: Home / Self Care | Payer: Medicare PPO | Source: Ambulatory Visit | Attending: Family Medicine | Admitting: Family Medicine

## 2023-05-23 ENCOUNTER — Ambulatory Visit: Payer: Medicare PPO

## 2023-05-23 DIAGNOSIS — Z1231 Encounter for screening mammogram for malignant neoplasm of breast: Secondary | ICD-10-CM

## 2023-05-29 ENCOUNTER — Other Ambulatory Visit: Payer: Self-pay | Admitting: Family Medicine

## 2023-05-31 ENCOUNTER — Encounter: Payer: Self-pay | Admitting: Family Medicine

## 2023-05-31 ENCOUNTER — Other Ambulatory Visit (HOSPITAL_BASED_OUTPATIENT_CLINIC_OR_DEPARTMENT_OTHER): Payer: Self-pay

## 2023-05-31 ENCOUNTER — Ambulatory Visit: Payer: Medicare PPO | Admitting: Family Medicine

## 2023-05-31 VITALS — BP 128/61 | HR 66 | Temp 98.1°F | Ht 63.0 in | Wt 147.2 lb

## 2023-05-31 DIAGNOSIS — K5900 Constipation, unspecified: Secondary | ICD-10-CM

## 2023-05-31 DIAGNOSIS — E782 Mixed hyperlipidemia: Secondary | ICD-10-CM

## 2023-05-31 DIAGNOSIS — I10 Essential (primary) hypertension: Secondary | ICD-10-CM | POA: Diagnosis not present

## 2023-05-31 DIAGNOSIS — E039 Hypothyroidism, unspecified: Secondary | ICD-10-CM | POA: Diagnosis not present

## 2023-05-31 DIAGNOSIS — F419 Anxiety disorder, unspecified: Secondary | ICD-10-CM

## 2023-05-31 DIAGNOSIS — Z23 Encounter for immunization: Secondary | ICD-10-CM

## 2023-05-31 LAB — COMPREHENSIVE METABOLIC PANEL
ALT: 15 U/L (ref 0–35)
AST: 18 U/L (ref 0–37)
Albumin: 4.5 g/dL (ref 3.5–5.2)
Alkaline Phosphatase: 45 U/L (ref 39–117)
BUN: 17 mg/dL (ref 6–23)
CO2: 31 mEq/L (ref 19–32)
Calcium: 9.8 mg/dL (ref 8.4–10.5)
Chloride: 100 mEq/L (ref 96–112)
Creatinine, Ser: 0.87 mg/dL (ref 0.40–1.20)
GFR: 62.31 mL/min (ref >60.00)
Glucose, Bld: 82 mg/dL (ref 70–99)
Potassium: 3.3 mEq/L — ABNORMAL LOW (ref 3.5–5.1)
Sodium: 141 mEq/L (ref 135–145)
Total Bilirubin: 0.9 mg/dL (ref 0.2–1.2)
Total Protein: 6.7 g/dL (ref 6.0–8.3)

## 2023-05-31 LAB — CBC WITH DIFFERENTIAL/PLATELET
Basophils Absolute: 0.1 10*3/uL (ref 0.0–0.1)
Basophils Relative: 1.3 % (ref 0.0–3.0)
Eosinophils Absolute: 0.2 10*3/uL (ref 0.0–0.7)
Eosinophils Relative: 2.4 % (ref 0.0–5.0)
HCT: 41.6 % (ref 36.0–46.0)
Hemoglobin: 13.5 g/dL (ref 12.0–15.0)
Lymphocytes Relative: 25.1 % (ref 12.0–46.0)
Lymphs Abs: 2 10*3/uL (ref 0.7–4.0)
MCHC: 32.6 g/dL (ref 30.0–36.0)
MCV: 91.9 fl (ref 78.0–100.0)
Monocytes Absolute: 0.6 10*3/uL (ref 0.1–1.0)
Monocytes Relative: 8.3 % (ref 3.0–12.0)
Neutro Abs: 4.9 10*3/uL (ref 1.4–7.7)
Neutrophils Relative %: 62.9 % (ref 43.0–77.0)
Platelets: 333 10*3/uL (ref 150.0–400.0)
RBC: 4.52 Mil/uL (ref 3.87–5.11)
RDW: 12.7 % (ref 11.5–15.5)
WBC: 7.8 10*3/uL (ref 4.0–10.5)

## 2023-05-31 LAB — LIPID PANEL
Cholesterol: 130 mg/dL (ref 0–200)
HDL: 65.3 mg/dL (ref >39.00)
LDL Cholesterol: 48 mg/dL (ref 0–99)
NonHDL: 64.62
Total CHOL/HDL Ratio: 2
Triglycerides: 84 mg/dL (ref 0.0–149.0)
VLDL: 16.8 mg/dL (ref 0.0–40.0)

## 2023-05-31 LAB — TSH: TSH: 1.07 u[IU]/mL (ref 0.35–5.50)

## 2023-05-31 MED ORDER — COVID-19 MRNA VAC-TRIS(PFIZER) 30 MCG/0.3ML IM SUSY
0.3000 mL | PREFILLED_SYRINGE | Freq: Once | INTRAMUSCULAR | 0 refills | Status: AC
  Filled 2023-05-31: qty 0.3, 1d supply, fill #0

## 2023-05-31 NOTE — Patient Instructions (Addendum)
Glad you are doing so well- continue current medications   We considered anxiety medicine like low dose Zoloft but reconsider at next visit  Please stop by lab before you go If you have mychart- we will send your results within 3 business days of [place] receiving them.  If you do not have mychart- we will call you about results within 5 business days of [place] receiving them.  *please also note that you will see labs on mychart as soon as they post. I will later go in and write notes on them- will say "notes from Dr. Durene Cal"   Recommended follow up: Return for next already scheduled visit or sooner if needed.

## 2023-05-31 NOTE — Progress Notes (Signed)
Phone [phone] In person visit   Subjective:   [name] is a 82 y.o. year old very pleasant female patient who presents for/with See problem oriented charting Chief Complaint  Patient presents with   Anxiety   Constipation    Pt states that the constipation has improved, pt was able to see PA at GI, taking Miralax and Benefiber    Hypertension   Past Medical History-  Patient Active Problem List   Diagnosis Date Noted   CAD (coronary artery disease) s/p stent mCx 05/2019 06/02/2019    Priority: High   Grieving 02/18/2017    Priority: High   Osteoporosis 11/05/2016    Priority: High   Anxiety 11/27/2022    Priority: Medium    Aortic atherosclerosis (HCC) 09/15/2020    Priority: Medium    Chest pain 12/29/2018    Priority: Medium    Primary open-angle glaucoma 11/20/2017    Priority: Medium    Hypothyroidism 07/01/2013    Priority: Medium    Hypertension 11/02/2012    Priority: Medium    Hyperlipidemia 07/24/2011    Priority: Medium    hx: breast cancer, IDC (Microinvasive) w DCIS, 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 Take once daily unless blood pressure elevated above 150 then take twice daily (Patient taking differently: Take 2.5-5 mg by mouth at bedtime. Pt reports taking 1/2 tablet daily) 180 tablet 3   Ascorbic Acid (VITAMIN C PO) Take by mouth. 1 gummy daily     aspirin EC 81 MG tablet Take 81 mg by mouth daily.     Bimatoprost (LUMIGAN OP) Place 1 drop into both eyes at  bedtime.      chlorthalidone (HYGROTON) 25 MG tablet Take 1 tablet (25 mg total) by mouth daily. Pt needs to keep upcoming appt in Dec for further refills 90 tablet 3   Cholecalciferol (VITAMIN D-3) 5000 UNITS TABS Take 5,000 mg by mouth daily.     dorzolamide-timolol (COSOPT) 22.3-6.8 MG/ML ophthalmic solution Place 1 drop into both eyes 2 (two) times daily.      Evolocumab (REPATHA SURECLICK) 140 MG/ML SOAJ INJECT 140MG  UNDER THE SKIN EVERY 2 WEEKS 6 mL 3   famotidine (PEPCID) 20 MG tablet TAKE 1 TABLET BY MOUTH TWICE DAILY. 180 tablet 3   icosapent Ethyl (VASCEPA) 1 g capsule Take 1 capsule (1 g total) by mouth 2 (two) times daily. 180 capsule 1   levothyroxine (SYNTHROID) 50 MCG tablet TAKE ONE TABLET BY MOUTH BEFORE BREAKFAST 90 tablet 3   metoprolol tartrate (LOPRESSOR) 25 MG tablet TAKE ONE TABLET BY MOUTH TWICE DAILY 180 tablet 0   moxifloxacin (VIGAMOX) 0.5 % ophthalmic solution Place into the right eye.     NEXLETOL 180 MG TABS TAKE 1 TABLET EVERY DAY 90 tablet 3   nitroGLYCERIN (NITROSTAT) 0.4 MG SL tablet Place 1 tablet (0.4 mg total) under the tongue every 5 (five) minutes x 3 doses as needed for chest pain. Pt needs to  keep upcoming appt in Dec for further refills 25 tablet 0   pilocarpine (PILOCAR) 1 % ophthalmic solution Place 1 drop into both eyes 2 (two) times daily.      Polyethyl Glycol-Propyl Glycol (SYSTANE OP) Place 1-2 drops into both eyes as needed (for dry eyes).      polyethylene glycol powder (GLYCOLAX/MIRALAX) 17 GM/SCOOP powder Take 1 Container by mouth daily.     potassium chloride (KLOR-CON) 10 MEQ tablet Take 2 tablets (20 mEq total) by mouth daily. 180 tablet 3   prednisoLONE acetate (PRED FORTE) 1 % ophthalmic suspension Place 1 drop into the right eye 4 (four) times daily.     Probiotic Product (PROBIOTIC DAILY) CAPS Take 1 capsule by mouth daily. Reported on 10/10/2015     Wheat Dextrin (BENEFIBER DRINK MIX PO) Take by mouth.     Wheat Dextrin (BENEFIBER DRINK  MIX) PACK Take by mouth.     No current facility-administered medications for this visit.     Objective:  BP 128/61 Comment: most recent home reading  Pulse 66   Temp 98.1 F (36.7 C)   Ht 5\' 3"  (1.6 m)   Wt 147 lb 3.2 oz (66.8 kg)   SpO2 95%   BMI 26.08 kg/m  Gen: NAD, resting comfortably CV: RRR no murmurs rubs or gallops Lungs: CTAB no crackles, wheeze, rhonchi Ext: trace edema Skin: warm, dry     Assessment and Plan   #social update- if [facility] calls she plans to make the move as of now  #constipation- has improved- saw gastroenterology and found that helpful- currently on miralax and benefiber  gummies.    # Prolonged grieving/depression/anxiety-extremely close to her now deceased spouse and challenging transition since his passing February 2019 -going to do grieving group at church- 6 women with [facility]. Good friendship she has developed there -bad experience with therapy- was charged over $300 for one visit with Dr. Monna Fam -not interested in medication as of today but may reconsider at a time where I a mnot going to be out of office within a week (in case anxiety goes up).  -she feels improved with anxiety and hoping to continue and maybe stay of meds  #CAD-follows with Dr. Tenny Craw on aspirin #hyperlipidemia S: Medication: Repatha 140 mg every 2 weeks, nexletol/bempedoic acid 180mg  daily and Vascepa 1 g  (prior clear study- stopped mid 2022), aspirin 81 mg - no chest pain or shortness of breath Lab Results  Component Value Date   CHOL 142 07/19/2022   HDL 64.20 07/19/2022   LDLCALC 58 07/19/2022   LDLDIRECT 54.0 06/01/2021   TRIG 98.0 07/19/2022   CHOLHDL 2 07/19/2022  A/P: coronary artery disease asymptomatic - continue current medications  Lipids at goal- update lipid panel- continue current medications   #hypertension S: medication: Metoprolol 25 mg twice daily, amlodipine 2.5 mg (with 2nd dose if needed for high BP over 150  given variability), chlorthalidone 25 mg with potassium daily Home readings #s: in general have looked very good- occasional high (with stress)- occasional low but overall appear to be around 120s-low 130s over 60's to 70s BP Readings from Last 3 Encounters:  05/31/23 128/61  05/10/23 130/68  04/30/23 (!) 144/60  A/P: well controlled continue current medications  - with amlodipine sometimes takes half if blood pressure lower  #hypothyroidism S: compliant On thyroid medication-levothyroxine 50 mcg Lab Results  Component Value Date   TSH 1.62 01/21/2023   A/P:stable- continue current medicines    Recommended  follow up: Return for next already scheduled visit or sooner if needed. Future Appointments  Date Time Provider Department Center  08/01/2023  9:00 AM [facility] ANNUAL WELLNESS VISIT 1 [facility]  08/01/2023 10:00 AM Shelva Majestic, MD [facility]  08/15/2023  9:00 AM Unk Lightning, PA [facility]  09/16/2023  8:40 AM Pricilla Riffle, MD [facility]  10/03/2023 11:00 AM [place] Shirley Friar, MD [facility] None   Lab/Order associations:   ICD-10-CM   1. Primary hypertension  I10     2. Hypothyroidism, unspecified type  E03.9 TSH    3. Constipation, unspecified constipation type  K59.00     4. Need for influenza vaccination  Z23 Flu Vaccine Trivalent High Dose (Fluad)    5. Anxiety  F41.9     6. Mixed hyperlipidemia  E78.2 Comprehensive metabolic panel    CBC with Differential/Platelet    Lipid panel     No orders of the defined types were placed in this encounter.  Return precautions advised.  Tana Conch, MD

## 2023-06-06 ENCOUNTER — Other Ambulatory Visit: Payer: Self-pay

## 2023-06-06 ENCOUNTER — Encounter (HOSPITAL_BASED_OUTPATIENT_CLINIC_OR_DEPARTMENT_OTHER): Payer: Self-pay | Admitting: Emergency Medicine

## 2023-06-06 ENCOUNTER — Emergency Department (HOSPITAL_BASED_OUTPATIENT_CLINIC_OR_DEPARTMENT_OTHER): Payer: Medicare PPO | Admitting: Radiology

## 2023-06-06 ENCOUNTER — Emergency Department (HOSPITAL_BASED_OUTPATIENT_CLINIC_OR_DEPARTMENT_OTHER)
Admission: EM | Admit: 2023-06-06 | Discharge: 2023-06-07 | Disposition: A | Discharge status: Home / Self Care | Payer: Medicare PPO | Attending: Emergency Medicine | Admitting: Emergency Medicine

## 2023-06-06 DIAGNOSIS — I10 Essential (primary) hypertension: Secondary | ICD-10-CM | POA: Insufficient documentation

## 2023-06-06 DIAGNOSIS — R519 Headache, unspecified: Secondary | ICD-10-CM | POA: Diagnosis present

## 2023-06-06 DIAGNOSIS — Z7982 Long term (current) use of aspirin: Secondary | ICD-10-CM | POA: Insufficient documentation

## 2023-06-06 DIAGNOSIS — R7309 Other abnormal glucose: Secondary | ICD-10-CM | POA: Insufficient documentation

## 2023-06-06 DIAGNOSIS — Z79899 Other long term (current) drug therapy: Secondary | ICD-10-CM | POA: Diagnosis not present

## 2023-06-06 DIAGNOSIS — E876 Hypokalemia: Secondary | ICD-10-CM | POA: Diagnosis not present

## 2023-06-06 DIAGNOSIS — R079 Chest pain, unspecified: Secondary | ICD-10-CM | POA: Diagnosis not present

## 2023-06-06 LAB — BASIC METABOLIC PANEL
Anion gap: 10 (ref 5–15)
BUN: 20 mg/dL (ref 8–23)
CO2: 29 mmol/L (ref 22–32)
Calcium: 9.7 mg/dL (ref 8.9–10.3)
Chloride: 102 mmol/L (ref 98–111)
Creatinine, Ser: 0.91 mg/dL (ref 0.44–1.00)
GFR, Estimated: >60 mL/min (ref >60)
Glucose, Bld: 142 mg/dL — ABNORMAL HIGH (ref 70–99)
Potassium: 3.2 mmol/L — ABNORMAL LOW (ref 3.5–5.1)
Sodium: 141 mmol/L (ref 135–145)

## 2023-06-06 LAB — CBC
HCT: 42.4 % (ref 36.0–46.0)
Hemoglobin: 14.1 g/dL (ref 12.0–15.0)
MCH: 30.3 pg (ref 26.0–34.0)
MCHC: 33.3 g/dL (ref 30.0–36.0)
MCV: 91 fL (ref 80.0–100.0)
Platelets: 354 10*3/uL (ref 150–400)
RBC: 4.66 MIL/uL (ref 3.87–5.11)
RDW: 12 % (ref 11.5–15.5)
WBC: 8.7 10*3/uL (ref 4.0–10.5)
nRBC: 0 % (ref 0.0–0.2)

## 2023-06-06 LAB — TROPONIN I (HIGH SENSITIVITY): Troponin I (High Sensitivity): 4 ng/L (ref <18)

## 2023-06-06 NOTE — ED Triage Notes (Signed)
High BP 150/130 hr 92 at home Concerned her BP and HR have been more elevated today. Recently started on additional potassium supplement by PCP (low k on labs)- feels like this started after taking supplement  Did not take night time BP medications yet

## 2023-06-07 LAB — TROPONIN I (HIGH SENSITIVITY): Troponin I (High Sensitivity): 6 ng/L (ref <18)

## 2023-06-07 MED ORDER — POTASSIUM CHLORIDE CRYS ER 20 MEQ PO TBCR
40.0000 meq | EXTENDED_RELEASE_TABLET | Freq: Once | ORAL | Status: AC
  Administered 2023-06-07: 40 meq via ORAL
  Filled 2023-06-07: qty 2

## 2023-06-07 MED ORDER — POTASSIUM CHLORIDE ER 10 MEQ PO TBCR: 20.0000 meq | EXTENDED_RELEASE_TABLET | Freq: Two times a day (BID) | ORAL | 3 refills | Status: DC

## 2023-06-07 NOTE — ED Provider Notes (Signed)
EMERGENCY DEPARTMENT AT [facility] Provider Note   CSN: [id number] Arrival date & time: 06/06/23  2036     History  Chief Complaint  Patient presents with   Hypertension    [name] is a 82 y.o. female.  The history is provided by the patient.  Hypertension  She checked her blood pressure this morning and it was very elevated-180/150.  Her heart rate was also elevated at 102.  She took an extra half tablet of amlodipine, and blood pressure did not come down significantly.  Heart rate came down, but not as low as what she normally runs.  She is complaining of a mild headache.  She denies chest pain or dyspnea.  Blood pressures had been generally doing well until today.   Home Medications Prior to Admission medications   Medication Sig Start Date End Date Taking? Authorizing Provider  amLODipine (NORVASC) 2.5 MG tablet Take 1-2 tablets (2.5-5 mg total) by mouth at bedtime. Take once daily unless blood pressure elevated above 150 then take twice daily Patient taking differently: Take 2.5-5 mg by mouth at bedtime. Pt reports taking 1/2 tablet daily 04/18/22   Shelva Majestic, MD  Ascorbic Acid (VITAMIN C PO) Take by mouth. 1 gummy daily    [provider]  aspirin EC 81 MG tablet Take 81 mg by mouth daily.    [provider]  Bimatoprost (LUMIGAN OP) Place 1 drop into both eyes at bedtime.     [provider]  chlorthalidone (HYGROTON) 25 MG tablet Take 1 tablet (25 mg total) by mouth daily. Pt needs to keep upcoming appt in Dec for further refills 08/13/22   Pricilla Riffle, MD  Cholecalciferol (VITAMIN D-3) 5000 UNITS TABS Take 5,000 mg by mouth daily.    [provider]  dorzolamide-timolol (COSOPT) 22.3-6.8 MG/ML ophthalmic solution Place 1 drop into both eyes 2 (two) times daily.     [provider]  Evolocumab (REPATHA SURECLICK) 140 MG/ML SOAJ INJECT 140MG  UNDER THE SKIN EVERY 2 WEEKS 02/12/23   Pricilla Riffle, MD   famotidine (PEPCID) 20 MG tablet TAKE 1 TABLET BY MOUTH TWICE DAILY. 05/29/23   Shelva Majestic, MD  icosapent Ethyl (VASCEPA) 1 g capsule Take 1 capsule (1 g total) by mouth 2 (two) times daily. 02/12/23   Pricilla Riffle, MD  levothyroxine (SYNTHROID) 50 MCG tablet TAKE ONE TABLET BY MOUTH BEFORE BREAKFAST 04/30/23   Shelva Majestic, MD  metoprolol tartrate (LOPRESSOR) 25 MG tablet TAKE ONE TABLET BY MOUTH TWICE DAILY 05/21/23   Shelva Majestic, MD  moxifloxacin (VIGAMOX) 0.5 % ophthalmic solution Place into the right eye. 09/28/22   [provider]  NEXLETOL 180 MG TABS TAKE 1 TABLET EVERY DAY 11/15/22   Shelva Majestic, MD  nitroGLYCERIN (NITROSTAT) 0.4 MG SL tablet Place 1 tablet (0.4 mg total) under the tongue every 5 (five) minutes x 3 doses as needed for chest pain. Pt needs to keep upcoming appt in Dec for further refills 07/20/22   Pricilla Riffle, MD  pilocarpine [facility]) 1 % ophthalmic solution Place 1 drop into both eyes 2 (two) times daily.     [provider]  Polyethyl Glycol-Propyl Glycol (SYSTANE OP) Place 1-2 drops into both eyes as needed (for dry eyes).     [provider]  polyethylene glycol powder (GLYCOLAX/MIRALAX) 17 GM/SCOOP powder Take 1 Container by mouth daily.    [provider]  potassium chloride (KLOR-CON) 10  MEQ tablet Take 2 tablets (20 mEq total) by mouth 2 (two) times daily. 06/07/23   Dione Booze, MD  prednisoLONE acetate (PRED FORTE) 1 % ophthalmic suspension Place 1 drop into the right eye 4 (four) times daily. 10/02/22   [provider]  Probiotic Product (PROBIOTIC DAILY) CAPS Take 1 capsule by mouth daily. Reported on 10/10/2015    [provider]  Wheat Dextrin (BENEFIBER DRINK MIX PO) Take by mouth.    [provider]  Wheat Dextrin (BENEFIBER DRINK MIX) PACK Take by mouth.    [provider]      Allergies    Adhesive [tape] and Rosuvastatin    Review of Systems   Review of Systems   All other systems reviewed and are negative.   Physical Exam Updated Vital Signs BP (!) 140/54   Pulse 81   Temp 98 F (36.7 C) (Oral)   Resp 16   SpO2 97%  Physical Exam Vitals and nursing note reviewed.   82 year old female, resting comfortably and in no acute distress. Vital signs are elevated blood pressure. Oxygen saturation is 97%, which is normal. Head is normocephalic and atraumatic. PERRLA, EOMI. Oropharynx is clear.  Fundi show no hemorrhage or exudate. Neck is nontender and supple without adenopathy or JVD. Lungs are clear without rales, wheezes, or rhonchi. Chest is nontender. Heart has regular rate and rhythm without murmur. Abdomen is soft, flat, nontender. Extremities have no cyanosis or edema. Skin is warm and dry without rash. Neurologic: Mental status is normal, cranial nerves are intact, moves all extremities equally.  ED Results / Procedures / Treatments   Labs (all labs ordered are listed, but only abnormal results are displayed) Labs Reviewed  BASIC METABOLIC PANEL - Abnormal; Notable for the following components:      Result Value   Potassium 3.2 (*)    Glucose, Bld 142 (*)    All other components within normal limits  CBC  TROPONIN I (HIGH SENSITIVITY)  TROPONIN I (HIGH SENSITIVITY)    EKG EKG Interpretation Date/Time:  Thursday June 06 2023 20:56:30 EDT Ventricular Rate:  99 PR Interval:  168 QRS Duration:  86 QT Interval:  346 QTC Calculation: 444 R Axis:   13  Text Interpretation: Normal sinus rhythm Cannot rule out Anterior infarct , age undetermined Abnormal ECG When compared with ECG of 25-Nov-2022 11:34, No significant change was found Confirmed by Dione Booze ([id number]) on 06/07/2023 12:55:53 AM  Radiology DG Chest 2 View  Result Date: 06/06/2023 CLINICAL DATA:  Chest pain EXAM: CHEST - 2 VIEW COMPARISON:  12/30/2020 FINDINGS: Lungs are well expanded, symmetric, and clear. No pneumothorax or pleural effusion. Cardiac size  within normal limits. Pulmonary vascularity is normal. Osseous structures are age-appropriate. No acute bone abnormality. IMPRESSION: No active cardiopulmonary disease. Electronically Signed   By: Helyn Numbers M.D.   On: 06/06/2023 22:59    Procedures Procedures    Medications Ordered in ED Medications  potassium chloride SA (KLOR-CON M) CR tablet 40 mEq (has no administration in time range)    ED Course/ Medical Decision Making/ A&P                                 Medical Decision Making Amount and/or Complexity of Data Reviewed Labs: ordered. Radiology: ordered.  Risk Prescription drug management.   Elevated blood pressure and patient with history of hypertension.  Blood pressure has been somewhat labile  in the emergency department.  Initial blood pressure was 178/94 but came down to 140/54 and then went back up to 177/95.  However, I am not seeing any evidence of endorgan damage.  I have reviewed her electrocardiogram, and my interpretation is no acute ST or T changes.  Chest x-ray shows no active cardiopulmonary disease.  I have independently viewed the images, and agree with radiologist's interpretation.  I have reviewed her laboratory tests, and my interpretation is elevated random glucose level, low potassium, normal CBC.  I note that she is on a diuretic.  Patient states that hypokalemia was diagnosed at office visit 1 week ago and she was not advised to increase her potassium to 30 mEq a day for 4 days before returning to her baseline dose of 20 mEq a day.  This has clearly failed, I have ordered a dose of oral potassium.  Patient is very anxious and I think this is playing a role in her labile hypertension.  I have advised her to continue monitoring her blood pressure at home but not more than once or twice a day.  I am giving her a new prescription for potassium with instructions to take 40 mill equivalents a day.  I have advised her to work with her cardiologist regarding any  adjustments that need to be made in her blood pressure medication.  Final Clinical Impression(s) / ED Diagnoses Final diagnoses:  Elevated blood pressure reading with diagnosis of hypertension  Diuretic-induced hypokalemia  Elevated random blood glucose level    Rx / DC Orders ED Discharge Orders          Ordered    potassium chloride (KLOR-CON) 10 MEQ tablet  2 times daily        06/07/23 0111              Dione Booze, MD 06/07/23 [phone]

## 2023-06-07 NOTE — Discharge Instructions (Addendum)
Continue to check your blood pressure once or twice a day.  If your blood pressure is staying consistently high, discussed with your cardiologist how to adjust your medications.  Your potassium was low today, even with the higher dose of potassium pills at home.  This is because one of your blood pressure medicines makes you lose potassium.  You need to increase your potassium dose to 2 pills at a time, twice a day.  You need to stay on this dose unless one of your doctors tells you to change it.

## 2023-06-07 NOTE — ED Notes (Signed)
Discharge instructions discussed with pt and daughter. Pt verbalized understanding. Pt stable and ambulatory.

## 2023-06-11 ENCOUNTER — Telehealth: Payer: Self-pay | Admitting: Internal Medicine

## 2023-06-11 NOTE — Telephone Encounter (Signed)
I spoke with the pt and she reports that she ws in the ED 06/06/23: NOTE: She checked her blood pressure this morning and it was very elevated-180/150. Her heart rate was also elevated at 102. She took an extra half tablet of amlodipine, and blood pressure did not come down significantly.    She says she ws having a ot of anxiety that day and has been grieving her husband more than she has been due to feeling lonely.   Since then her BP has been doing okay.Marland Kitchen a few high readings but she says she gets upset and keeps checking several times in a row.   Her readings the last few days:   9/29 142/65 HR 76 152/79 HR 81 149/76 HR 80  9/30 130/72 Hr 71 111/59 HR 58  10/1  121/63 HR 73 131/57 HR 79  She is f/u in Jan 2025 with DR Tenny Craw.... until Dr Tenny Craw returns she will keep a log only twice a day for now and I will follow up with her and see how she is doing.

## 2023-06-11 NOTE — Telephone Encounter (Signed)
Pt called in stating she was told to call Dr. Tenny Craw and inform that she was in the ED. She wants to know if she needs to have any of her meds adjusted.

## 2023-06-17 NOTE — Telephone Encounter (Signed)
Confirm how much amlodipine she is taking   If taking 2.5 mg  I would increase to 5 mg

## 2023-06-18 MED ORDER — AMLODIPINE BESYLATE 5 MG PO TABS: 5.0000 mg | ORAL_TABLET | Freq: Every day | ORAL | 3 refills | Status: DC

## 2023-06-18 NOTE — Telephone Encounter (Signed)
I talked with the pt today...and she will take Amlodipine 5 mg every morning and keep an eye on her BP and let us know how she is doing.

## 2023-06-21 ENCOUNTER — Telehealth: Payer: Self-pay | Admitting: Internal Medicine

## 2023-06-21 NOTE — Telephone Encounter (Signed)
Agree with plan 

## 2023-06-21 NOTE — Telephone Encounter (Signed)
Spoke with pt who states she took her BP last night because she was not feeling well and received a reading of 177/84 HR-99.  Pt states she took 1/2 tablet of Amlodipine 2.5mg .  This morning her BP was 137/72 without medication.  Pt checked BP again later in the morning and BP was 150/89 so she took Amlodipine 2.5mg  - 1 tablet by mouth.   Pt states she plans to go back to taking Amlodipine 2.5mg  - 1 tablet by mouth bid as previously prescribed so she will have BP coverage at night and wanted to make Dr Tenny Craw aware.  Pt advised to continue to monitor BP daily.  Will forward to Dr Tenny Craw for further recommendation if needed.

## 2023-06-21 NOTE — Telephone Encounter (Signed)
Patient is calling to speak with Dr. Tenny Craw or nurse in regards to the change of dosage in the medication amlodipine. Please call back to discuss

## 2023-06-24 ENCOUNTER — Other Ambulatory Visit: Payer: Self-pay

## 2023-06-24 MED ORDER — AMLODIPINE BESYLATE 2.5 MG PO TABS: 2.5000 mg | ORAL_TABLET | Freq: Two times a day (BID) | ORAL | 3 refills | Status: DC

## 2023-06-24 MED ORDER — AMLODIPINE BESYLATE 2.5 MG PO TABS: 2.5000 mg | ORAL_TABLET | Freq: Two times a day (BID) | ORAL | Status: DC

## 2023-06-24 NOTE — Telephone Encounter (Signed)
I spoke with the pt for an extensive amount of time today... over 20 min.. she is confused about her BP meds and has been checking her BP multiple times a day and just very anxious.   She has not been taking her Amlodipine every morning but most nights and as needed and having many fluctuating BP readings... however she has been feeling well.   She will start fresh today and take the Amlodipine 2.5 mg every morning and every evening without any missed doses..   She will then check her BP 2 hours after taking her meds and after sitting for 10-15 min... she will keep a log of the 2 BP readings a day and let us know by the end of the week how she is doing,.   I will send the instructions to her My Chart per her request.

## 2023-06-26 MED ORDER — AMLODIPINE BESYLATE 5 MG PO TABS: 5.0000 mg | ORAL_TABLET | Freq: Two times a day (BID) | ORAL | 3 refills | Status: DC

## 2023-06-26 NOTE — Telephone Encounter (Signed)
If she is not having any ankle swelling I would increase amlodpine to 5 mg bid  Send follow up readings in a few wks

## 2023-06-26 NOTE — Addendum Note (Signed)
Addended by: Bertram Millard on: 06/26/2023 05:09 PM   Modules accepted: Orders

## 2023-07-08 NOTE — Telephone Encounter (Signed)
Cut back on amlodipine to dose that does not cause swelling   Was 5 mg OK? I would then recomm she add Telmisartan to regimen   Start with 40 mg    Follow up BMET in 10 days

## 2023-07-09 ENCOUNTER — Other Ambulatory Visit: Payer: Self-pay

## 2023-07-09 DIAGNOSIS — Z79899 Other long term (current) drug therapy: Secondary | ICD-10-CM

## 2023-07-09 DIAGNOSIS — I700 Atherosclerosis of aorta: Secondary | ICD-10-CM

## 2023-07-09 DIAGNOSIS — I10 Essential (primary) hypertension: Secondary | ICD-10-CM

## 2023-07-09 DIAGNOSIS — I2510 Atherosclerotic heart disease of native coronary artery without angina pectoris: Secondary | ICD-10-CM

## 2023-07-09 MED ORDER — AMLODIPINE BESYLATE 5 MG PO TABS: 5.0000 mg | ORAL_TABLET | Freq: Every day | ORAL | Status: DC

## 2023-07-09 MED ORDER — TELMISARTAN 40 MG PO TABS: 40.0000 mg | ORAL_TABLET | Freq: Every day | ORAL | 3 refills | Status: DC

## 2023-07-09 NOTE — Telephone Encounter (Signed)
Left a message for the pt to call back.  

## 2023-07-09 NOTE — Progress Notes (Signed)
telmis

## 2023-07-09 NOTE — Telephone Encounter (Signed)
Pt walked in and brought list of BP readings and I have handed off to Dr Tenny Craw for her review.   Pt says she thinks she did better with her lower extremity edema when she was just taking the Amlodipine 5 mg once a day.[name]   She will decrease her dose to daily and start the Telmisartan as reccommended by Dr Tenny Craw.   She will keep an eye on her edema and BP and let us know if any improvement but if her edema worsens she will call us asap.   I saw her ankles and they were mildly puffy at the ankle bone... no pain, redness, or heat and not pitting. Pt denies any SOB.   She will have labs in 10 days.

## 2023-07-10 ENCOUNTER — Telehealth: Payer: Self-pay | Admitting: *Deleted

## 2023-07-10 NOTE — Telephone Encounter (Signed)
Transition Care Management Unsuccessful Follow-up Telephone Call  Date of discharge and from where:  [facility]  06/07/2023  Attempts:  1st Attempt  Reason for unsuccessful TCM follow-up call:  Left voice message

## 2023-07-11 ENCOUNTER — Telehealth: Payer: Self-pay | Admitting: *Deleted

## 2023-07-11 MED ORDER — AMLODIPINE BESYLATE 2.5 MG PO TABS: 2.5000 mg | ORAL_TABLET | Freq: Every day | ORAL | Status: DC

## 2023-07-11 NOTE — Addendum Note (Signed)
Addended by: Bertram Millard on: 07/11/2023 10:33 AM   Modules accepted: Orders

## 2023-07-12 ENCOUNTER — Ambulatory Visit: Payer: Medicare PPO | Admitting: Physician Assistant

## 2023-07-12 NOTE — Telephone Encounter (Signed)
Transition Care Management Unsuccessful Follow-up Telephone Call  Date of discharge and from where:  [facility]  06/07/2023  Attempts:  2nd Attempt  Reason for unsuccessful TCM follow-up call: No answer

## 2023-07-19 ENCOUNTER — Other Ambulatory Visit: Payer: Self-pay | Admitting: *Deleted

## 2023-07-19 ENCOUNTER — Telehealth: Payer: Self-pay

## 2023-07-19 DIAGNOSIS — I10 Essential (primary) hypertension: Secondary | ICD-10-CM

## 2023-07-19 DIAGNOSIS — I2510 Atherosclerotic heart disease of native coronary artery without angina pectoris: Secondary | ICD-10-CM

## 2023-07-19 DIAGNOSIS — I700 Atherosclerosis of aorta: Secondary | ICD-10-CM

## 2023-07-19 DIAGNOSIS — Z79899 Other long term (current) drug therapy: Secondary | ICD-10-CM

## 2023-07-19 LAB — BASIC METABOLIC PANEL
BUN/Creatinine Ratio: 30 — ABNORMAL HIGH (ref 12–28)
BUN: 26 mg/dL (ref 8–27)
CO2: 19 mmol/L — ABNORMAL LOW (ref 20–29)
Calcium: 9.8 mg/dL (ref 8.7–10.3)
Chloride: 103 mmol/L (ref 96–106)
Creatinine, Ser: 0.87 mg/dL (ref 0.57–1.00)
Glucose: 82 mg/dL (ref 70–99)
Potassium: 4.7 mmol/L (ref 3.5–5.2)
Sodium: 138 mmol/L (ref 134–144)
eGFR: 67 mL/min/{1.73_m2} (ref >59)

## 2023-07-19 NOTE — Telephone Encounter (Signed)
Pt dropped off BP readings on the Telmisartan 40 every day and Amlodipine 2.5 mg every day... she is feeling well and had labs drawn today:   BP 119/58, 137/62, 119/61, 134/67, 132/62, 128/69.

## 2023-07-21 NOTE — Telephone Encounter (Signed)
Keep on same meds  ?

## 2023-07-22 NOTE — Telephone Encounter (Signed)
Pt advised.

## 2023-07-26 LAB — BASIC METABOLIC PANEL

## 2023-08-01 ENCOUNTER — Encounter: Payer: Self-pay | Admitting: Family Medicine

## 2023-08-01 ENCOUNTER — Ambulatory Visit: Payer: Medicare PPO | Admitting: Family Medicine

## 2023-08-01 VITALS — BP 120/68 | HR 70 | Temp 97.7°F | Ht 63.0 in | Wt 146.2 lb

## 2023-08-01 DIAGNOSIS — Z0000 Encounter for general adult medical examination without abnormal findings: Secondary | ICD-10-CM | POA: Diagnosis not present

## 2023-08-01 DIAGNOSIS — E663 Overweight: Secondary | ICD-10-CM | POA: Diagnosis not present

## 2023-08-01 DIAGNOSIS — E559 Vitamin D deficiency, unspecified: Secondary | ICD-10-CM | POA: Diagnosis not present

## 2023-08-01 DIAGNOSIS — Z131 Encounter for screening for diabetes mellitus: Secondary | ICD-10-CM

## 2023-08-01 DIAGNOSIS — I2510 Atherosclerotic heart disease of native coronary artery without angina pectoris: Secondary | ICD-10-CM

## 2023-08-01 DIAGNOSIS — I10 Essential (primary) hypertension: Secondary | ICD-10-CM

## 2023-08-01 DIAGNOSIS — E039 Hypothyroidism, unspecified: Secondary | ICD-10-CM

## 2023-08-01 DIAGNOSIS — E782 Mixed hyperlipidemia: Secondary | ICD-10-CM | POA: Diagnosis not present

## 2023-08-01 LAB — CBC WITH DIFFERENTIAL/PLATELET
Basophils Absolute: 0.1 10*3/uL (ref 0.0–0.1)
Basophils Relative: 1.2 % (ref 0.0–3.0)
Eosinophils Absolute: 0.1 10*3/uL (ref 0.0–0.7)
Eosinophils Relative: 1.2 % (ref 0.0–5.0)
HCT: 40.7 % (ref 36.0–46.0)
Hemoglobin: 13.3 g/dL (ref 12.0–15.0)
Lymphocytes Relative: 24.2 % (ref 12.0–46.0)
Lymphs Abs: 1.9 10*3/uL (ref 0.7–4.0)
MCHC: 32.6 g/dL (ref 30.0–36.0)
MCV: 92.5 fL (ref 78.0–100.0)
Monocytes Absolute: 0.5 10*3/uL (ref 0.1–1.0)
Monocytes Relative: 6.5 % (ref 3.0–12.0)
Neutro Abs: 5.2 10*3/uL (ref 1.4–7.7)
Neutrophils Relative %: 66.9 % (ref 43.0–77.0)
Platelets: 351 10*3/uL (ref 150.0–400.0)
RBC: 4.4 Mil/uL (ref 3.87–5.11)
RDW: 13.2 % (ref 11.5–15.5)
WBC: 7.7 10*3/uL (ref 4.0–10.5)

## 2023-08-01 LAB — COMPREHENSIVE METABOLIC PANEL
ALT: 12 U/L (ref 0–35)
AST: 17 U/L (ref 0–37)
Albumin: 4.5 g/dL (ref 3.5–5.2)
Alkaline Phosphatase: 46 U/L (ref 39–117)
BUN: 22 mg/dL (ref 6–23)
CO2: 26 meq/L (ref 19–32)
Calcium: 9.6 mg/dL (ref 8.4–10.5)
Chloride: 104 meq/L (ref 96–112)
Creatinine, Ser: 1.06 mg/dL (ref 0.40–1.20)
GFR: 49.1 mL/min — ABNORMAL LOW (ref >60.00)
Glucose, Bld: 95 mg/dL (ref 70–99)
Potassium: 4.5 meq/L (ref 3.5–5.1)
Sodium: 140 meq/L (ref 135–145)
Total Bilirubin: 1 mg/dL (ref 0.2–1.2)
Total Protein: 6.6 g/dL (ref 6.0–8.3)

## 2023-08-01 LAB — HEMOGLOBIN A1C: Hgb A1c MFr Bld: 5.9 % (ref 4.6–6.5)

## 2023-08-01 LAB — VITAMIN D 25 HYDROXY (VIT D DEFICIENCY, FRACTURES): VITD: 43.77 ng/mL (ref 30.00–100.00)

## 2023-08-01 LAB — TSH: TSH: 1.72 u[IU]/mL (ref 0.35–5.50)

## 2023-08-01 NOTE — Progress Notes (Signed)
Phone [phone]   Subjective:  Patient presents today for their annual physical. Chief complaint-noted.   See problem oriented charting- ROS- full  review of systems was completed and negative except for: stress, prior high blood pressure but much better now  The following were reviewed and entered/updated in epic: Past Medical History:  Diagnosis Date   Abnormal thyroid function test 07/01/2013   Anxiety    Arthritis    Cancer (HCC)    Coronary artery disease    Dermatitis 11/25/2013   Glaucoma 11/02/2012   HTN (hypertension) 11/02/2012   hx: breast cancer, IDC (Microinvasive) w DCIS, receptor + her 2 - 07/10/2011   Hyperlipidemia    Hypothyroid    Obesity    Patient Active Problem List   Diagnosis Date Noted   CAD (coronary artery disease) s/p stent mCx 05/2019 06/02/2019    Priority: High   Grieving 02/18/2017    Priority: High   Osteoporosis 11/05/2016    Priority: High   Anxiety 11/27/2022    Priority: Medium    Aortic atherosclerosis (HCC) 09/15/2020    Priority: Medium    Chest pain 12/29/2018    Priority: Medium    Primary open-angle glaucoma 11/20/2017    Priority: Medium    Hypothyroidism 07/01/2013    Priority: Medium    Hypertension 11/02/2012    Priority: Medium    Hyperlipidemia 07/24/2011    Priority: Medium    hx: breast cancer, IDC (Microinvasive) w DCIS, receptor + her 2 - 07/10/2011    Priority: Medium    History of adenomatous polyp of colon 09/30/2018    Priority: Low   Medicare annual wellness visit, subsequent 09/26/2014    Priority: Low   Vitamin D deficiency 11/02/2012    Priority: Low   Obesity (BMI 30-39.9) 07/24/2011    Priority: Low   Statin myopathy 07/01/2019   Past Surgical History:  Procedure Laterality Date   AUGMENTATION MAMMAPLASTY Left    05/2006   BREAST SURGERY  10/2005   Left Mastectomy, reconstruction, reduction on right. Dr. Odis Luster   CARDIAC CATHETERIZATION     COLONOSCOPY     CORONARY STENT INTERVENTION N/A  06/01/2019   Procedure: CORONARY STENT INTERVENTION;  Surgeon: Tonny Bollman, MD;  Location: [facility] INVASIVE CV LAB;  Service: Cardiovascular;  Laterality: N/A;   CRYOTHERAPY     GYN for CIN III   DILATION AND CURETTAGE OF UTERUS     EXCISION MORTON'S NEUROMA     right foot   EYE SURGERY Right 05/08/2018   LEFT HEART CATH AND CORONARY ANGIOGRAPHY N/A 06/01/2019   Procedure: LEFT HEART CATH AND CORONARY ANGIOGRAPHY;  Surgeon: Tonny Bollman, MD;  Location: [facility] INVASIVE CV LAB;  Service: Cardiovascular;  Laterality: N/A;   MASTECTOMY     REDUCTION MAMMAPLASTY Right     Family History  Problem Relation Age of Onset   Pneumonia Mother    Hyperlipidemia Mother    Parkinsonism Mother    CAD Mother    Other Father        bilateral subdural hematoma   Hyperlipidemia Sister    Colon cancer Paternal Grandmother    Liver disease Neg Hx    Esophageal cancer Neg Hx     Medications- reviewed and updated Current Outpatient Medications  Medication Sig Dispense Refill   amLODipine (NORVASC) 2.5 MG tablet Take 1 tablet (2.5 mg total) by mouth daily.     Ascorbic Acid (VITAMIN C PO) Take by mouth. 1 gummy daily     aspirin  EC 81 MG tablet Take 81 mg by mouth daily.     Bimatoprost (LUMIGAN OP) Place 1 drop into both eyes at bedtime.      chlorthalidone (HYGROTON) 25 MG tablet Take 1 tablet (25 mg total) by mouth daily. Pt needs to keep upcoming appt in Dec for further refills 90 tablet 3   Cholecalciferol (VITAMIN D-3) 5000 UNITS TABS Take 5,000 mg by mouth daily.     dorzolamide-timolol (COSOPT) 22.3-6.8 MG/ML ophthalmic solution Place 1 drop into both eyes 2 (two) times daily.      Evolocumab (REPATHA SURECLICK) 140 MG/ML SOAJ INJECT 140MG  UNDER THE SKIN EVERY 2 WEEKS 6 mL 3   famotidine (PEPCID) 20 MG tablet TAKE 1 TABLET BY MOUTH TWICE DAILY. 180 tablet 3   icosapent Ethyl (VASCEPA) 1 g capsule Take 1 capsule (1 g total) by mouth 2 (two) times daily. 180 capsule 1   levothyroxine (SYNTHROID) 50  MCG tablet TAKE ONE TABLET BY MOUTH BEFORE BREAKFAST 90 tablet 3   metoprolol tartrate (LOPRESSOR) 25 MG tablet TAKE ONE TABLET BY MOUTH TWICE DAILY 180 tablet 0   moxifloxacin (VIGAMOX) 0.5 % ophthalmic solution Place into the right eye.     NEXLETOL 180 MG TABS TAKE 1 TABLET EVERY DAY 90 tablet 3   nitroGLYCERIN (NITROSTAT) 0.4 MG SL tablet Place 1 tablet (0.4 mg total) under the tongue every 5 (five) minutes x 3 doses as needed for chest pain. Pt needs to keep upcoming appt in Dec for further refills 25 tablet 0   pilocarpine (PILOCAR) 1 % ophthalmic solution Place 1 drop into both eyes 2 (two) times daily.      Polyethyl Glycol-Propyl Glycol (SYSTANE OP) Place 1-2 drops into both eyes as needed (for dry eyes).      polyethylene glycol powder (GLYCOLAX/MIRALAX) 17 GM/SCOOP powder Take 1 Container by mouth daily.     potassium chloride (KLOR-CON) 10 MEQ tablet Take 2 tablets (20 mEq total) by mouth 2 (two) times daily. 240 tablet 3   prednisoLONE acetate (PRED FORTE) 1 % ophthalmic suspension Place 1 drop into the right eye 4 (four) times daily.     Probiotic Product (PROBIOTIC DAILY) CAPS Take 1 capsule by mouth daily. Reported on 10/10/2015     telmisartan (MICARDIS) 40 MG tablet Take 1 tablet (40 mg total) by mouth daily. 90 tablet 3   Wheat Dextrin (BENEFIBER DRINK MIX PO) Take by mouth.     Wheat Dextrin (BENEFIBER DRINK MIX) PACK Take by mouth.     No current facility-administered medications for this visit.    Allergies-reviewed and updated Allergies  Allergen Reactions   Adhesive [Tape] Other (See Comments)    Causes skin to turn red where touched   Rosuvastatin Other (See Comments)    Joint & muscle aches, sleep issues.    Social History   Social History Narrative   Widowed 2019- lives alone. Was  Married 1981. 1 daughter, 2 step sons. 4 grandchildren      Midwife      Hobbies: going out to eat, activities with church including cancer support group at church    Objective  Objective:  BP 120/68   Pulse 70   Temp 97.7 F (36.5 C)   Ht 5\' 3"  (1.6 m)   Wt 146 lb 3.2 oz (66.3 kg)   SpO2 98%   BMI 25.90 kg/m  Gen: NAD, resting comfortably HEENT: Mucous membranes are moist. Oropharynx normal Neck: no thyromegaly CV: RRR no murmurs rubs or  gallops Lungs: CTAB no crackles, wheeze, rhonchi Abdomen: soft/nontender/nondistended/normal bowel sounds. No rebound or guarding.  Ext: no edema Skin: warm, dry Neuro: grossly normal, moves all extremities, PERRLA   Assessment and Plan   82 y.o. female presenting for annual physical.  Health Maintenance counseling: 1. Anticipatory guidance: Patient counseled regarding regular dental exams -q6 months, eye exams - 3x a year,  avoiding smoking and second hand smoke , limiting alcohol to 1 beverage per day- very rare alcohol , no illicit drugs .   2. Risk factor reduction:  Advised patient of need for regular exercise and diet rich and fruits and vegetables to reduce risk of heart attack and stroke.  Exercise- [employer] 3x a week MWF.  Diet/weight management--down 7 lbs in last year- has tried to eat healthy diet.  Wt Readings from Last 3 Encounters:  08/01/23 146 lb 3.2 oz (66.3 kg)  05/31/23 147 lb 3.2 oz (66.8 kg)  05/10/23 144 lb (65.3 kg)  3. Immunizations/screenings/ancillary studies- had COVID shot in September- up to date  Immunization History  Administered Date(s) Administered   Fluad Quad(high Dose 65+) 04/29/2019, 05/12/2020, 06/01/2021, 05/31/2022, 06/05/2022   Fluad Trivalent(High Dose 65+) 05/31/2023   Influenza Split 07/24/2011   Influenza Whole 06/15/2009   Influenza, High Dose Seasonal PF 06/29/2013, 07/05/2015, 05/10/2016, 05/21/2017, 06/18/2018   Influenza,inj,Quad PF,6+ Mos 06/21/2014, 06/14/2016   PFIZER(Purple Top)SARS-COV-2 Vaccination 10/05/2019, 10/26/2019, 06/17/2020   Pfizer Covid-19 Vaccine Bivalent Booster 34yrs & up 07/10/2021   Pfizer(Comirnaty)Fall Seasonal Vaccine 12  years and older 08/10/2022, 05/31/2023   Pneumococcal Conjugate-13 04/15/2014   Pneumococcal Polysaccharide-23 07/17/2000, 10/02/2016   Respiratory Syncytial Virus Vaccine,Recomb Aduvanted(Arexvy) 08/24/2022   Tdap 07/17/2010, 09/18/2022   Zoster Recombinant(Shingrix) 10/15/2018, 03/17/2019  4. Cervical cancer screening-had a normal Pap smear at age 72-would not advise repeat - no vaginal discharge or bleeding  5. Breast cancer screening-  breast exam - planned when sees GYN- and mammogram 05/23/23 with 1 year repeat planned 6. Colon cancer screening - 09/22/18 with 5 year repeat planned due to polyp history- she wants to hold off on repeat 7. Skin cancer screening-have discussed following up with Dr. [place]- she is going to call to schedule. advised regular sunscreen use. Denies worrisome, changing, or new skin lesions.  8. Birth control/STD check- Declined not sexually active.   9. Osteoporosis screening at 74- DEXA 10/29/16- see discussion below -never smoker -   Status of chronic or acute concerns   # Prolonged grieving/depression/anxiety-extremely close to her now deceased spouse and challenging transition since his passing February 2019 -Working with Dr. Monna Fam starting 01/09/2023 but insurance didn't cover- bill collector sent- bad experience. Eventually [employer] covered.  - she is planning to go to [facility] if there is an opening.  - she feels she is in a better spot and wants to hold off on medicine- possibly sertraline or Prozac as worried about weight gain  #CAD-follows with Dr. Tenny Craw on aspirin #PAD listed by Dr. Clotilde Dieter does have aortic atherosclerosis #hyperlipidemia- statin myalgia S: Medication: Repatha 140 mg every 2 weeks, nexletol/bempedoic acid 180mg  daily and Vascepa 1 g  (prior clear study- stopped mid 2022), aspirin 81 mg Lab Results  Component Value Date   CHOL 130 05/31/2023   HDL 65.30 05/31/2023   LDLCALC 48 05/31/2023   LDLDIRECT 54.0 06/01/2021   TRIG 84.0  05/31/2023   CHOLHDL 2 05/31/2023   A/P: cholesterol has been fantastic. Nexletol is moving up to more expensive tier per insurance but they do not list the price  yet- we will have to wait and see what happens- she's done so well perhaps we could just trial off the nexletol potentially -too soon for cholesterol repeat- hold off for now Coronary artery disease- no chest pain or shortness of breath - asymptomatic continue current medications - also good for peripheral arterial disease   #hypertension S: medication: Metoprolol 25 mg twice daily, amlodipine 2.5 mg (with 2nd dose if needed for high BP over 150 given variability), telmisartan 40 mg per Dr. Tenny Craw, chlorthalidone 25 mg Home readings #s: very good control  BP Readings from Last 3 Encounters:  08/01/23 120/68  06/07/23 (!) 140/54  05/31/23 128/61  A/P: stable- continue current medicines   #hypothyroidism S: compliant On thyroid medication-levothyroxine 50 mcg Lab Results  Component Value Date   TSH 1.07 05/31/2023  A/P:hopefully stable- update tsh today. Continue current meds for now    #Vitamin D deficiency S: Medication: 5000 units daily Last vitamin D Lab Results  Component Value Date   VD25OH 50.55 11/19/2022  A/P: hopefully stable- update vitamin D today. Continue current meds for now    #screen for diabetes with a1c  #Osteoporosis- patient has not tolerated Prolia due to achiness and has had a lot of dental issues so not thought ideal candidate for bisphosphonates- holding off on medications as a result and focusing on fall prevention- strongly advised to avoid falls  Recommended follow up: Return in about 2 months (around 10/01/2023) for followup or sooner if needed.Schedule b4 you leave. Future Appointments  Date Time Provider Department Center  08/05/2023  9:45 AM [facility] ANNUAL WELLNESS VISIT 1 [facility]  08/15/2023  9:00 AM Unk Lightning, PA [facility]  09/16/2023  8:40 AM Pricilla Riffle, MD  [facility]  10/03/2023 11:00 AM [place] Shirley Friar, MD [facility] None   Lab/Order associations: fasting   ICD-10-CM   1. Preventative health care  Z00.00     2. Coronary artery disease involving native coronary artery of native heart without angina pectoris  I25.10     3. Primary hypertension  I10     4. Mixed hyperlipidemia  E78.2 Comprehensive metabolic panel    CBC with Differential/Platelet    5. Hypothyroidism, unspecified type  E03.9 TSH    6. Vitamin D deficiency  E55.9 VITAMIN D 25 Hydroxy (Vit-D Deficiency, Fractures)    7. Screening for diabetes mellitus  Z13.1 Hemoglobin A1c    8. Overweight  E66.3 Hemoglobin A1c      No orders of the defined types were placed in this encounter.   Return precautions advised.  Tana Conch, MD

## 2023-08-01 NOTE — Patient Instructions (Addendum)
Please stop by lab before you go If you have mychart- we will send your results within 3 business days of [place] receiving them.  If you do not have mychart- we will call you about results within 5 business days of [place] receiving them.  *please also note that you will see labs on mychart as soon as they post. I will later go in and write notes on them- will say "notes from Dr. Durene Cal"   We talked about using medicine for anxiety/stress but since you are doing better wanted to hold off for now and discuss at 2 month follow up   Recommended follow up: Return in about 2 months (around 10/01/2023) for followup or sooner if needed.Schedule b4 you leave.

## 2023-08-05 ENCOUNTER — Other Ambulatory Visit: Payer: Self-pay | Admitting: Internal Medicine

## 2023-08-05 ENCOUNTER — Ambulatory Visit (INDEPENDENT_AMBULATORY_CARE_PROVIDER_SITE_OTHER): Payer: Medicare PPO

## 2023-08-05 VITALS — BP 122/78 | HR 64 | Temp 97.7°F | Wt 144.8 lb

## 2023-08-05 DIAGNOSIS — Z0000 Encounter for general adult medical examination without abnormal findings: Secondary | ICD-10-CM | POA: Diagnosis not present

## 2023-08-05 NOTE — Progress Notes (Signed)
Subjective:   [name] is a 82 y.o. female who presents for Medicare Annual (Subsequent) preventive examination.  Visit Complete: In person   Cardiac Risk Factors include: advanced age (>26men, >69 women);hypertension;dyslipidemia     Objective:    Today's Vitals   08/05/23 0952  BP: 122/78  Pulse: 64  Temp: 97.7 F (36.5 C)  SpO2: 92%  Weight: 144 lb 12.8 oz (65.7 kg)   Body mass index is 25.65 kg/m.     08/05/2023   10:00 AM 06/06/2023    8:54 PM 02/14/2023    6:09 PM 11/25/2022   10:52 AM 09/13/2022    1:30 PM 07/19/2022    8:37 AM 07/11/2022   10:04 AM  Advanced Directives  Does Patient Have a Medical Advance Directive? Yes No Yes No Yes Yes No  Type of Estate agent of [place];Living will  Living will;Healthcare Power of [employer] Power of [place];Living will Healthcare Power of [place];Living will   Does patient want to make changes to medical advance directive?     No - Patient declined    Copy of Healthcare Power of Attorney in Chart? No - copy requested  No - copy requested   No - copy requested   Would patient like information on creating a medical advance directive?  No - Patient declined  No - Patient declined       Current Medications (verified) Outpatient Encounter Medications as of 08/05/2023  Medication Sig   amLODipine (NORVASC) 2.5 MG tablet Take 1 tablet (2.5 mg total) by mouth daily.   Ascorbic Acid (VITAMIN C PO) Take by mouth. 1 gummy daily   aspirin EC 81 MG tablet Take 81 mg by mouth daily.   Bimatoprost (LUMIGAN OP) Place 1 drop into both eyes at bedtime.    chlorthalidone (HYGROTON) 25 MG tablet Take 1 tablet (25 mg total) by mouth daily. Pt needs to keep upcoming appt in Dec for further refills   Cholecalciferol (VITAMIN D-3) 5000 UNITS TABS Take 5,000 mg by mouth daily.   dorzolamide-timolol (COSOPT) 22.3-6.8 MG/ML ophthalmic solution Place 1 drop into both eyes 2 (two) times daily.    Evolocumab  (REPATHA SURECLICK) 140 MG/ML SOAJ INJECT 140MG  UNDER THE SKIN EVERY 2 WEEKS   famotidine (PEPCID) 20 MG tablet TAKE 1 TABLET BY MOUTH TWICE DAILY.   icosapent Ethyl (VASCEPA) 1 g capsule Take 1 capsule (1 g total) by mouth 2 (two) times daily.   levothyroxine (SYNTHROID) 50 MCG tablet TAKE ONE TABLET BY MOUTH BEFORE BREAKFAST   metoprolol tartrate (LOPRESSOR) 25 MG tablet TAKE ONE TABLET BY MOUTH TWICE DAILY   moxifloxacin (VIGAMOX) 0.5 % ophthalmic solution Place into the right eye.   NEXLETOL 180 MG TABS TAKE 1 TABLET EVERY DAY   nitroGLYCERIN (NITROSTAT) 0.4 MG SL tablet Place 1 tablet (0.4 mg total) under the tongue every 5 (five) minutes x 3 doses as needed for chest pain. Pt needs to keep upcoming appt in Dec for further refills   pilocarpine (PILOCAR) 1 % ophthalmic solution Place 1 drop into both eyes 2 (two) times daily.    Polyethyl Glycol-Propyl Glycol (SYSTANE OP) Place 1-2 drops into both eyes as needed (for dry eyes).    polyethylene glycol powder (GLYCOLAX/MIRALAX) 17 GM/SCOOP powder Take 1 Container by mouth daily.   potassium chloride (KLOR-CON) 10 MEQ tablet Take 2 tablets (20 mEq total) by mouth 2 (two) times daily.   prednisoLONE acetate (PRED FORTE) 1 % ophthalmic suspension Place 1  drop into the right eye 4 (four) times daily.   Probiotic Product (PROBIOTIC DAILY) CAPS Take 1 capsule by mouth daily. Reported on 10/10/2015   telmisartan (MICARDIS) 40 MG tablet Take 1 tablet (40 mg total) by mouth daily.   Wheat Dextrin (BENEFIBER DRINK MIX PO) Take by mouth.   Wheat Dextrin (BENEFIBER DRINK MIX) PACK Take by mouth.   No facility-administered encounter medications on file as of 08/05/2023.    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 Dr. Odis Luster   CARDIAC CATHETERIZATION     COLONOSCOPY     CORONARY STENT INTERVENTION N/A 06/01/2019   Procedure: CORONARY STENT INTERVENTION;  Surgeon: Tonny Bollman, MD;  Location: [facility] INVASIVE CV LAB;  Service: Cardiovascular;  Laterality: N/A;   CRYOTHERAPY     GYN for CIN III   DILATION AND CURETTAGE OF UTERUS     EXCISION MORTON'S NEUROMA     right foot   EYE SURGERY Right 05/08/2018   LEFT HEART CATH AND CORONARY ANGIOGRAPHY N/A 06/01/2019   Procedure: LEFT HEART CATH AND CORONARY ANGIOGRAPHY;  Surgeon: Tonny Bollman, MD;  Location: [facility] INVASIVE CV LAB;  Service: Cardiovascular;  Laterality: N/A;   MASTECTOMY     REDUCTION MAMMAPLASTY Right    Family History  Problem Relation Age of Onset   Pneumonia Mother    Hyperlipidemia Mother    Parkinsonism Mother    CAD Mother    Other Father        bilateral subdural hematoma   Hyperlipidemia Sister    Colon cancer Paternal Grandmother    Liver disease Neg Hx    Esophageal cancer Neg Hx    Social History   Socioeconomic History   Marital status: Widowed    Spouse name: Not on file   Number of children: 1   Years of education: Not on file   Highest education level: Not on file  Occupational History   Occupation: retired  Tobacco Use   Smoking status: Never    Passive exposure: Never   Smokeless tobacco: Never  Vaping Use   Vaping status: Never Used  Substance and Sexual Activity   Alcohol use: Yes    Comment: Rare   Drug use: No   Sexual activity: Not on file  Other Topics Concern   Not on file  Social History Narrative   Widowed 2019- lives alone. Was  Married 1981. 1 daughter, 2 step  sons. 4 grandchildren      Midwife      Hobbies: going out to eat, activities with church including cancer support group at church   Social Determinants of Health   Financial Resource Strain: Low Risk  (08/05/2023)   Overall Financial Resource Strain (CARDIA)    Difficulty of Paying Living Expenses: Not hard at all  Food Insecurity: No Food Insecurity (08/05/2023)   Hunger Vital Sign    Worried  About Running Out of Food in the Last Year: Never true    Ran Out of Food in the Last Year: Never true  Transportation Needs: No Transportation Needs (08/05/2023)   PRAPARE - Administrator, Civil Service (Medical): No    Lack of Transportation (Non-Medical): No  Physical Activity: Sufficiently Active (08/05/2023)   Exercise Vital Sign    Days of Exercise per Week: 3 days    Minutes of Exercise per Session: 50 min  Stress: No Stress Concern Present (08/05/2023)   [employer] of Occupational Health - Occupational Stress Questionnaire    Feeling of Stress : Only a little  Social Connections: Moderately Integrated (08/05/2023)   Social Connection and Isolation Panel [NHANES]    Frequency of Communication with Friends and Family: More than three times a week    Frequency of Social Gatherings with Friends and Family: More than three times a week    Attends Religious Services: More than 4 times per year    Active Member of [employer] or Organizations: Yes    Attends Banker Meetings: 1 to 4 times per year    Marital Status: Widowed    Tobacco Counseling Counseling given: Not Answered   Clinical Intake:  Pre-visit preparation completed: Yes  Pain : No/denies pain     BMI - recorded: 25.65 Nutritional Status: BMI 25 -29 Overweight Nutritional Risks: None Diabetes: No  How often do you need to have someone help you when you read instructions, pamphlets, or other written materials from your doctor or pharmacy?: 1 - Never  Interpreter Needed?:  No  Information entered by :: Lanier Ensign   Activities of Daily Living    08/05/2023    9:53 AM  In your present state of health, do you have any difficulty performing the following activities:  Hearing? 1  Comment hearing aids  Vision? 0  Difficulty concentrating or making decisions? 0  Walking or climbing stairs? 0  Dressing or bathing? 0  Doing errands, shopping? 0  Preparing Food and eating ? N  Using the Toilet? N  In the past six months, have you accidently leaked urine? N  Do you have problems with loss of bowel control? N  Managing your Medications? N  Managing your Finances? N  Housekeeping or managing your Housekeeping? N    Patient Care Team: Shelva Majestic, MD as PCP - General (Family Medicine) Pricilla Riffle, MD as PCP - Cardiology (Cardiology) Magrinat, Valentino Hue, MD (Inactive) (Hematology and Oncology) Cyndia Bent, MD (Inactive) (General Surgery) Etter Sjogren, MD (Plastic Surgery) Ernesto Rutherford, MD as Consulting Physician (Ophthalmology)  Indicate any recent Medical Services you may have received from other than [facility] providers in the past year (date may be approximate).     Assessment:   This is a routine wellness examination for [name].  Hearing/Vision screen Hearing Screening - Comments:: Wears hearing aids  Vision Screening - Comments:: Pt follows up with Dr Loraine Grip for annual eye exam    Goals Addressed   None    Depression Screen    08/05/2023    9:58 AM 11/27/2022   11:46 AM 09/13/2022    1:30 PM 07/19/2022    8:34 AM 06/05/2022    9:22 AM 06/01/2021   12:53 PM 04/20/2021   11:09 AM  PHQ 2/9 Scores  PHQ - 2 Score 0 1 0 0 0 0 0  PHQ- 9 Score  3  0 0 0     Fall Risk  08/05/2023   10:00 AM 02/19/2023    2:05 PM 11/27/2022   11:46 AM 11/19/2022    9:59 AM 07/19/2022    8:38 AM  Fall Risk   Falls in the past year? 1 1 0 0 0  Number falls in past yr: 1 0 0 0 0  Injury with Fall? 1 1 0 0 0  Comment sore and bruised      Risk for  fall due to : Impaired vision;History of fall(s) History of fall(s) No Fall Risks No Fall Risks Impaired vision  Follow up Falls prevention discussed Falls evaluation completed Falls evaluation completed Falls evaluation completed Falls prevention discussed    MEDICARE RISK AT HOME: Medicare Risk at Home Any stairs in or around the home?: Yes If so, are there any without handrails?: No Home free of loose throw rugs in walkways, pet beds, electrical cords, etc?: Yes Adequate lighting in your home to reduce risk of falls?: Yes Life alert?: No Use of a cane, walker or w/c?: No Grab bars in the bathroom?: Yes Shower chair or bench in shower?: Yes Elevated toilet seat or a handicapped toilet?: Yes  TIMED UP AND GO:  Was the test performed?  Yes  Length of time to ambulate 10 feet: 10 sec Gait steady and fast without use of assistive device    Cognitive Function:    03/06/2018    3:05 PM 11/23/2016    9:47 AM  MMSE - Mini Mental State Exam  Not completed: -- --        08/05/2023   10:01 AM 07/19/2022    8:39 AM 06/01/2021    9:17 AM 05/26/2020    8:29 AM  6CIT Screen  What Year? 0 points 0 points 0 points 0 points  What month? 0 points 0 points 0 points 0 points  What time?  0 points 0 points   Count back from 20  0 points 0 points 0 points  Months in reverse  0 points 0 points 0 points  Repeat phrase  0 points 0 points 0 points  Total Score  0 points 0 points     Immunizations Immunization History  Administered Date(s) Administered   Fluad Quad(high Dose 65+) 04/29/2019, 05/12/2020, 06/01/2021, 05/31/2022, 06/05/2022   Fluad Trivalent(High Dose 65+) 05/31/2023   Influenza Split 07/24/2011   Influenza Whole 06/15/2009   Influenza, High Dose Seasonal PF 06/29/2013, 07/05/2015, 05/10/2016, 05/21/2017, 06/18/2018   Influenza,inj,Quad PF,6+ Mos 06/21/2014, 06/14/2016   PFIZER(Purple Top)SARS-COV-2 Vaccination 10/05/2019, 10/26/2019, 06/17/2020   Pfizer Covid-19 Vaccine  Bivalent Booster 48yrs & up 07/10/2021   Pfizer(Comirnaty)Fall Seasonal Vaccine 12 years and older 08/10/2022, 05/31/2023   Pneumococcal Conjugate-13 04/15/2014   Pneumococcal Polysaccharide-23 07/17/2000, 10/02/2016   Respiratory Syncytial Virus Vaccine,Recomb Aduvanted(Arexvy) 08/24/2022   Tdap 07/17/2010, 09/18/2022   Zoster Recombinant(Shingrix) 10/15/2018, 03/17/2019    TDAP status: Up to date  Flu Vaccine status: Up to date  Pneumococcal vaccine status: Up to date  Covid-19 vaccine status: Information provided on how to obtain vaccines.   Qualifies for Shingles Vaccine? Yes   Zostavax completed Yes   Shingrix Completed?: Yes  Screening Tests Health Maintenance  Topic Date Due   Colonoscopy  09/23/2023   COVID-19 Vaccine (7 - 2023-24 season) 08/17/2023 (Originally 07/26/2023)   MAMMOGRAM  05/22/2024   Medicare Annual Wellness (AWV)  08/04/2024   DTaP/Tdap/Td (3 - Td or Tdap) 09/18/2032   Pneumonia Vaccine 43+ Years old  Completed   INFLUENZA VACCINE  Completed   DEXA SCAN  Completed   Zoster Vaccines- Shingrix  Completed   HPV VACCINES  Aged Out   Hepatitis C Screening  Discontinued    Health Maintenance  Health Maintenance Due  Topic Date Due   Colonoscopy  09/23/2023    Colorectal cancer screening: Referral to GI placed due 09/23/23. Pt aware the office will call re: appt.  Mammogram status: Completed 05/23/23. Repeat every year  Bone Density status: Completed 10/29/16. Results reflect: Bone density results: OSTEOPOROSIS. Repeat every 2 years.   Additional Screening:  Hepatitis C Screening:  Completed 05/12/20  Vision Screening: Recommended annual ophthalmology exams for early detection of glaucoma and other disorders of the eye. Is the patient up to date with their annual eye exam?  Yes  Who is the provider or what is the name of the office in which the patient attends annual eye exams? Dr Loraine Grip  If pt is not established with a provider, would they like to  be referred to a provider to establish care? No .   Dental Screening: Recommended annual dental exams for proper oral hygiene   Community Resource Referral / Chronic Care Management: CRR required this visit?  No   CCM required this visit?  No     Plan:     I have personally reviewed and noted the following in the patient's chart:   Medical and social history Use of alcohol, tobacco or illicit drugs  Current medications and supplements including opioid prescriptions. Patient is not currently taking opioid prescriptions. Functional ability and status Nutritional status Physical activity Advanced directives List of other physicians Hospitalizations, surgeries, and ER visits in previous 12 months Vitals Screenings to include cognitive, depression, and falls Referrals and appointments  In addition, I have reviewed and discussed with patient certain preventive protocols, quality metrics, and best practice recommendations. A written personalized care plan for preventive services as well as general preventive health recommendations were provided to patient.     Marzella Schlein, LPN   40/06/2724   After Visit Summary: (MyChart) Due to this being a telephonic visit, the after visit summary with patients personalized plan was offered to patient via MyChart   Nurse Notes: none

## 2023-08-05 NOTE — Patient Instructions (Signed)
[name] , Thank you for taking time to come for your Medicare Wellness Visit. I appreciate your ongoing commitment to your health goals. Please review the following plan we discussed and let me know if I can assist you in the future.   Referrals/Orders/Follow-Ups/Clinician Recommendations: maintain health and activity   This is a list of the screening recommended for you and due dates:  Health Maintenance  Topic Date Due   Colon Cancer Screening  09/23/2023   COVID-19 Vaccine (7 - 2023-24 season) 08/17/2023*   Mammogram  05/22/2024   Medicare Annual Wellness Visit  08/04/2024   DTaP/Tdap/Td vaccine (3 - Td or Tdap) 09/18/2032   Pneumonia Vaccine  Completed   Flu Shot  Completed   DEXA scan (bone density measurement)  Completed   Zoster (Shingles) Vaccine  Completed   HPV Vaccine  Aged Out   Hepatitis C Screening  Discontinued  *Topic was postponed. The date shown is not the original due date.    Advanced directives: (Copy Requested) Please bring a copy of your health care power of attorney and living will to the office to be added to your chart at your convenience.  Next Medicare Annual Wellness Visit scheduled for next year: Yes

## 2023-08-07 DIAGNOSIS — C50912 Malignant neoplasm of unspecified site of left female breast: Secondary | ICD-10-CM | POA: Diagnosis not present

## 2023-08-15 ENCOUNTER — Ambulatory Visit: Payer: Medicare PPO | Admitting: Physician Assistant

## 2023-08-15 ENCOUNTER — Encounter: Payer: Self-pay | Admitting: Physician Assistant

## 2023-08-15 VITALS — BP 118/68 | HR 92 | Ht 63.0 in | Wt 146.0 lb

## 2023-08-15 DIAGNOSIS — R194 Change in bowel habit: Secondary | ICD-10-CM | POA: Diagnosis not present

## 2023-08-15 NOTE — Progress Notes (Signed)
Chief Complaint: Follow-up change in bowel habits  HPI:    [name] is an 82 year old female with a past medical history as listed below including CAD (12/30/2018 echo with LVEF 60-65%), hypertension multiple others, known to Dr. Russella Dar, who returns to clinic today for follow-up of a change in bowel habits.    09/22/2018 colonoscopy for history of adenomatous polyps with three 6-7 mm polyps in the sigmoid and transverse colon as well as mild diverticulosis in the sigmoid colon.  Pathology showed tubular adenomas.  No repeat due to age.    01/21/2023 TSH and CBC normal and CMP normal.    04/30/2023 patient seen in clinic by PCP for constipation.  At that time discussed she been complaining of constipation for at least 2 weeks, was taking MiraLAX with some relief but still hard stools and bloated.  Noted some anxiety given paternal grandmother nephew died of colon cancer.  On initial visit advised to take Colace, drink 64 ounces of water and take MiraLAX 1 capful a day and increase to 2 capsules if needed.  At the next visit she added fiber.  Continue to feel anxious.  At last visit MiraLAX increased to twice daily.    05/10/2023 patient seen in clinic and at that time described going through a lot of stress over the past years after the loss of her husband and was dealing with a change to constipation over the past 2 to 3 months.  It was better after addition of MiraLAX up to twice daily and increased water intake.  At that point we were holding off on colonoscopy.  Recommended MiraLAX once daily and titration of this in the future.  Also recommend a fiber supplement.    Today, patient is very excited to tell me that she is no longer having any issues with constipation.  In fact she was tending towards slightly looser stools with the Benefiber twice a day and MiraLAX daily so she has stopped using these products daily instead takes MiraLAX every other day and Benefiber once a day or every other day.  She is  glad to be back to normal.    Somewhat emotional today given that it is the holidays and she lost her husband a few years ago.    Denies fever, chills or weight loss.  Past Medical History:  Diagnosis Date   Abnormal thyroid function test 07/01/2013   Anxiety    Arthritis    Cancer (HCC)    Coronary artery disease    Dermatitis 11/25/2013   Glaucoma 11/02/2012   HTN (hypertension) 11/02/2012   hx: breast cancer, IDC (Microinvasive) w DCIS, receptor + her 2 - 07/10/2011   Hyperlipidemia    Hypothyroid    Obesity     Past Surgical History:  Procedure Laterality Date   AUGMENTATION MAMMAPLASTY Left    05/2006   BREAST SURGERY  10/2005   Left Mastectomy, reconstruction, reduction on right. Dr. Odis Luster   CARDIAC CATHETERIZATION     COLONOSCOPY     CORONARY STENT INTERVENTION N/A 06/01/2019   Procedure: CORONARY STENT INTERVENTION;  Surgeon: Tonny Bollman, MD;  Location: [facility] INVASIVE CV LAB;  Service: Cardiovascular;  Laterality: N/A;   CRYOTHERAPY     GYN for CIN III   DILATION AND CURETTAGE OF UTERUS     EXCISION MORTON'S NEUROMA     right foot   EYE SURGERY Right 05/08/2018   LEFT HEART CATH AND CORONARY ANGIOGRAPHY N/A 06/01/2019   Procedure: LEFT HEART CATH AND  CORONARY ANGIOGRAPHY;  Surgeon: Tonny Bollman, MD;  Location: [facility] INVASIVE CV LAB;  Service: Cardiovascular;  Laterality: N/A;   MASTECTOMY     REDUCTION MAMMAPLASTY Right     Current Outpatient Medications  Medication Sig Dispense Refill   amLODipine (NORVASC) 2.5 MG tablet Take 1 tablet (2.5 mg total) by mouth daily.     Ascorbic Acid (VITAMIN C PO) Take by mouth. 1 gummy daily     aspirin EC 81 MG tablet Take 81 mg by mouth daily.     Bimatoprost (LUMIGAN OP) Place 1 drop into both eyes at bedtime.      chlorthalidone (HYGROTON) 25 MG tablet Take 1 tablet (25 mg total) by mouth daily. Pt needs to keep upcoming appt in Dec for further refills 90 tablet 3   Cholecalciferol (VITAMIN D-3) 5000 UNITS TABS Take 5,000  mg by mouth daily.     dorzolamide-timolol (COSOPT) 22.3-6.8 MG/ML ophthalmic solution Place 1 drop into both eyes 2 (two) times daily.      Evolocumab (REPATHA SURECLICK) 140 MG/ML SOAJ INJECT 140MG  UNDER THE SKIN EVERY 2 WEEKS 6 mL 3   famotidine (PEPCID) 20 MG tablet TAKE 1 TABLET BY MOUTH TWICE DAILY. 180 tablet 3   icosapent Ethyl (VASCEPA) 1 g capsule TAKE 1 CAPSULE TWICE DAILY 180 capsule 1   levothyroxine (SYNTHROID) 50 MCG tablet TAKE ONE TABLET BY MOUTH BEFORE BREAKFAST 90 tablet 3   metoprolol tartrate (LOPRESSOR) 25 MG tablet TAKE ONE TABLET BY MOUTH TWICE DAILY 180 tablet 0   moxifloxacin (VIGAMOX) 0.5 % ophthalmic solution Place into the right eye.     NEXLETOL 180 MG TABS TAKE 1 TABLET EVERY DAY 90 tablet 3   nitroGLYCERIN (NITROSTAT) 0.4 MG SL tablet Place 1 tablet (0.4 mg total) under the tongue every 5 (five) minutes x 3 doses as needed for chest pain. Pt needs to keep upcoming appt in Dec for further refills 25 tablet 0   pilocarpine (PILOCAR) 1 % ophthalmic solution Place 1 drop into both eyes 2 (two) times daily.      Polyethyl Glycol-Propyl Glycol (SYSTANE OP) Place 1-2 drops into both eyes as needed (for dry eyes).      polyethylene glycol powder (GLYCOLAX/MIRALAX) 17 GM/SCOOP powder Take 1 Container by mouth daily.     potassium chloride (KLOR-CON) 10 MEQ tablet Take 2 tablets (20 mEq total) by mouth 2 (two) times daily. 240 tablet 3   prednisoLONE acetate (PRED FORTE) 1 % ophthalmic suspension Place 1 drop into the right eye 4 (four) times daily.     Probiotic Product (PROBIOTIC DAILY) CAPS Take 1 capsule by mouth daily. Reported on 10/10/2015     telmisartan (MICARDIS) 40 MG tablet Take 1 tablet (40 mg total) by mouth daily. 90 tablet 3   Wheat Dextrin (BENEFIBER DRINK MIX PO) Take by mouth.     Wheat Dextrin (BENEFIBER DRINK MIX) PACK Take by mouth.     No current facility-administered medications for this visit.    Allergies as of 08/15/2023 - Review Complete  08/15/2023  Allergen Reaction Noted   Adhesive [tape] Other (See Comments) 07/12/2011   Rosuvastatin Other (See Comments) 04/16/2012    Family History  Problem Relation Age of Onset   Pneumonia Mother    Hyperlipidemia Mother    Parkinsonism Mother    CAD Mother    Other Father        bilateral subdural hematoma   Hyperlipidemia Sister    Colon cancer Paternal Grandmother    Liver  disease Neg Hx    Esophageal cancer Neg Hx     Social History   Socioeconomic History   Marital status: Widowed    Spouse name: Not on file   Number of children: 1   Years of education: Not on file   Highest education level: Not on file  Occupational History   Occupation: retired  Tobacco Use   Smoking status: Never    Passive exposure: Never   Smokeless tobacco: Never  Vaping Use   Vaping status: Never Used  Substance and Sexual Activity   Alcohol use: Yes    Comment: Rare   Drug use: No   Sexual activity: Not on file  Other Topics Concern   Not on file  Social History Narrative   Widowed 2019- lives alone. Was  Married 1981. 1 daughter, 2 step sons. 4 grandchildren      Midwife      Hobbies: going out to eat, activities with church including cancer support group at church   Social Determinants of Health   Financial Resource Strain: Low Risk  (08/05/2023)   Overall Financial Resource Strain (CARDIA)    Difficulty of Paying Living Expenses: Not hard at all  Food Insecurity: No Food Insecurity (08/05/2023)   Hunger Vital Sign    Worried About Running Out of Food in the Last Year: Never true    Ran Out of Food in the Last Year: Never true  Transportation Needs: No Transportation Needs (08/05/2023)   PRAPARE - Administrator, Civil Service (Medical): No    Lack of Transportation (Non-Medical): No  Physical Activity: Sufficiently Active (08/05/2023)   Exercise Vital Sign    Days of Exercise per Week: 3 days    Minutes of Exercise per Session: 50 min   Stress: No Stress Concern Present (08/05/2023)   [employer] of Occupational Health - Occupational Stress Questionnaire    Feeling of Stress : Only a little  Social Connections: Moderately Integrated (08/05/2023)   Social Connection and Isolation Panel [NHANES]    Frequency of Communication with Friends and Family: More than three times a week    Frequency of Social Gatherings with Friends and Family: More than three times a week    Attends Religious Services: More than 4 times per year    Active Member of [employer] or Organizations: Yes    Attends Banker Meetings: 1 to 4 times per year    Marital Status: Widowed  Intimate Partner Violence: Not At Risk (08/05/2023)   Humiliation, Afraid, Rape, and Kick questionnaire    Fear of Current or Ex-Partner: No    Emotionally Abused: No    Physically Abused: No    Sexually Abused: No    Review of Systems:    Constitutional: No weight loss, fever or chills Cardiovascular: No chest pain Respiratory: No SOB  Gastrointestinal: See HPI and otherwise negative   Physical Exam:  Vital signs: BP 118/68 (BP Location: Left Arm, Patient Position: Sitting, Cuff Size: Normal)   Pulse 92   Ht 5\' 3"  (1.6 m)   Wt 146 lb (66.2 kg)   SpO2 100%   BMI 25.86 kg/m    Constitutional:   Pleasant Elderly Caucasian female appears to be in NAD, Well developed, Well nourished, alert and cooperative Respiratory: Respirations even and unlabored. Lungs clear to auscultation bilaterally.   No wheezes, crackles, or rhonchi.  Cardiovascular: Normal S1, S2. No MRG. Regular rate and rhythm. No peripheral edema, cyanosis or pallor.  Gastrointestinal:  Soft, nondistended, nontender. No rebound or guarding. Normal bowel sounds. No appreciable masses or hepatomegaly. Psychiatric: Oriented to person, place and time. Demonstrates good judgement and reason without abnormal affect or behaviors.  RELEVANT LABS AND IMAGING: CBC    Component Value Date/Time    WBC 7.7 08/01/2023 1054   RBC 4.40 08/01/2023 1054   HGB 13.3 08/01/2023 1054   HGB 13.7 11/08/2021 1231   HGB 13.2 12/28/2010 1333   HCT 40.7 08/01/2023 1054   HCT 42.2 11/08/2021 1231   HCT 41.7 12/28/2010 1333   PLT 351.0 08/01/2023 1054   PLT 360 11/08/2021 1231   MCV 92.5 08/01/2023 1054   MCV 88 11/08/2021 1231   MCV 88.5 12/28/2010 1333   MCH 30.3 06/06/2023 2056   MCHC 32.6 08/01/2023 1054   RDW 13.2 08/01/2023 1054   RDW 12.0 11/08/2021 1231   RDW 12.6 12/28/2010 1333   LYMPHSABS 1.9 08/01/2023 1054   LYMPHSABS 1.8 12/28/2010 1333   MONOABS 0.5 08/01/2023 1054   MONOABS 0.5 12/28/2010 1333   EOSABS 0.1 08/01/2023 1054   EOSABS 0.1 12/28/2010 1333   BASOSABS 0.1 08/01/2023 1054   BASOSABS 0.1 12/28/2010 1333    CMP     Component Value Date/Time   NA 140 08/01/2023 1054   NA 138 07/19/2023 1200   K 4.5 08/01/2023 1054   CL 104 08/01/2023 1054   CO2 26 08/01/2023 1054   GLUCOSE 95 08/01/2023 1054   BUN 22 08/01/2023 1054   BUN 26 07/19/2023 1200   CREATININE 1.06 08/01/2023 1054   CREATININE 0.85 05/12/2020 1136   CALCIUM 9.6 08/01/2023 1054   PROT 6.6 08/01/2023 1054   PROT 6.5 09/21/2019 0908   ALBUMIN 4.5 08/01/2023 1054   ALBUMIN 4.6 09/21/2019 0908   AST 17 08/01/2023 1054   ALT 12 08/01/2023 1054   ALKPHOS 46 08/01/2023 1054   BILITOT 1.0 08/01/2023 1054   BILITOT 0.5 09/21/2019 0908   GFRNONAA >60 06/06/2023 2056   GFRNONAA 66 05/12/2020 1136   GFRAA 72 08/19/2020 1438   GFRAA 76 05/12/2020 1136    Assessment: 1.  Change in bowel habits: Better now after a trial of additional fiber and MiraLAX  Plan: 1.  Discussed with patient that she should trial stopping the MiraLAX and just using Benefiber once daily.  She can add back in MiraLAX as needed for any constipation or trial increasing her fiber. 2.  Patient is grateful for the resolution of her symptoms.  She would like to see me again in 3 to 4 months just for reassurance.  We will put her in  recall.  Hyacinth Meeker, PA-C [facility] 08/15/2023, 9:06 AM  Cc: Shelva Majestic, MD

## 2023-08-15 NOTE — Patient Instructions (Signed)
Follow up in 3-4 months   If your blood pressure at your visit was 140/90 or greater, please contact your primary care physician to follow up on this.  _______________________________________________________  If you are age 82 or older, your body mass index should be between 23-30. Your Body mass index is 25.86 kg/m. If this is out of the aforementioned range listed, please consider follow up with your Primary Care Provider.  If you are age 61 or younger, your body mass index should be between 19-25. Your Body mass index is 25.86 kg/m. If this is out of the aformentioned range listed, please consider follow up with your Primary Care Provider.   ________________________________________________________  The [facility] providers would like to encourage you to use [facility] to communicate with providers for non-urgent requests or questions.  Due to long hold times on the telephone, sending your provider a message by [facility] may be a faster and more efficient way to get a response.  Please allow 48 business hours for a response.  Please remember that this is for non-urgent requests.   It was a pleasure to see you today!  Thank you for trusting me with your gastrointestinal care!    Hyacinth Meeker, PA-C

## 2023-08-16 ENCOUNTER — Telehealth: Payer: Self-pay | Admitting: Internal Medicine

## 2023-08-16 NOTE — Telephone Encounter (Signed)
Paper Work Dropped Off: Blood Pressure Readings  Date:08/16/2023  Location of paper: Dr Tenny Craw Mail box

## 2023-08-20 NOTE — Telephone Encounter (Signed)
BP readings given to Dr Tenny Craw.

## 2023-08-22 DIAGNOSIS — C50912 Malignant neoplasm of unspecified site of left female breast: Secondary | ICD-10-CM | POA: Diagnosis not present

## 2023-08-25 ENCOUNTER — Other Ambulatory Visit: Payer: Self-pay | Admitting: Family Medicine

## 2023-08-25 ENCOUNTER — Other Ambulatory Visit: Payer: Self-pay | Admitting: Internal Medicine

## 2023-09-09 DIAGNOSIS — H401413 Capsular glaucoma with pseudoexfoliation of lens, right eye, severe stage: Secondary | ICD-10-CM | POA: Diagnosis not present

## 2023-09-09 DIAGNOSIS — Z961 Presence of intraocular lens: Secondary | ICD-10-CM | POA: Diagnosis not present

## 2023-09-09 DIAGNOSIS — H401422 Capsular glaucoma with pseudoexfoliation of lens, left eye, moderate stage: Secondary | ICD-10-CM | POA: Diagnosis not present

## 2023-09-13 NOTE — Telephone Encounter (Signed)
 Med Rec complete, allergies and Pharmacy verified January 3

## 2023-09-15 NOTE — Progress Notes (Deleted)
 Cardiology Office Note   Date:  09/15/2023   ID:  [name], [name] 15, 1942, MRN [id number]  PCP:  Katrinka Garnette KIDD, MD  Cardiologist:   Vina Gull, MD    Patient presents for f/u of CAD    History of Present Illness: [name] is a 83 y.o. female with a history of HTN, HL and  CAD  She was admitted in April 2020 with CP   CT chest/abd showed extensive atherosclerosis.  Cardac CT angiogram showed no obstructive lesons  In  September2020 she was admtted with an NSTEMI.  Cardiac catheterization showed severe single-vessel disease with a critical stenosis of the left circumflex.  She underwent PTCA/DES to this area.  There was nonobstructive disease noted in the LAD.  Plan for dual antiplatelet therapy for 12 mont The pt went to ED on 12/2020 with chest pressure   Felt noncardiac  Spring 2022:   Lexiscan  myoview  after that was normal    Isaw the pt in Dec 2023    Since seen she was seen in ER for fall (lost balance going back into house)  She deneis dizziness  No CP   Breathing is OK    Going to start a class a Drawbridge for balance  I saw the pt in June 2024  No outpatient medications have been marked as taking for the 09/16/23 encounter (Appointment) with Gull Vina GAILS, MD.     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 Dr. Leora   CARDIAC CATHETERIZATION     COLONOSCOPY     CORONARY STENT INTERVENTION N/A 06/01/2019   Procedure:  CORONARY STENT INTERVENTION;  Surgeon: Wonda Sharper, MD;  Location: [facility] INVASIVE CV LAB;  Service: Cardiovascular;  Laterality: N/A;   CRYOTHERAPY     GYN for CIN III   DILATION AND CURETTAGE OF UTERUS     EXCISION MORTON'S NEUROMA     right foot   EYE SURGERY Right 05/08/2018   LEFT HEART CATH AND CORONARY ANGIOGRAPHY N/A 06/01/2019   Procedure: LEFT HEART CATH AND CORONARY ANGIOGRAPHY;  Surgeon: Wonda Sharper, MD;  Location: [facility] INVASIVE CV LAB;  Service: Cardiovascular;  Laterality: N/A;   MASTECTOMY     REDUCTION MAMMAPLASTY Right      Social History:  The patient  reports that she has never smoked. She has never been exposed to tobacco smoke. She has never used smokeless tobacco. She reports current alcohol  use. She reports that she does not use drugs.   Family History:  The patient's family history includes CAD in her mother; Colon cancer in her paternal grandmother; Hyperlipidemia in her mother and sister; Other in her father; Parkinsonism in her mother; Pneumonia in her mother.    ROS:  Please see the history of present illness. All other systems are reviewed and  Negative to the above problem except as noted.  PHYSICAL EXAM: VS:  There were no vitals taken for this visit.  GEN: Pt is a 83 yo female in no acute distress    Examined in chair   HEENT: normal  Neck: no JVD   Cardiac: RRR; no murmur  No LE edema  Respiratory:  CTA bilaterally    EKG:  EKG is not ordered today.     Lipid Panel    Component Value Date/Time   CHOL 130 05/31/2023 1111   CHOL 131 10/27/2020 0936   TRIG 84.0 05/31/2023 1111   HDL 65.30 05/31/2023 1111   HDL 64 10/27/2020 0936   CHOLHDL 2 05/31/2023 1111   VLDL 16.8 05/31/2023 1111   LDLCALC 48 05/31/2023 1111   LDLCALC 53 10/27/2020 0936   LDLCALC 30 05/12/2020 1136   LDLDIRECT 54.0 06/01/2021 0829      Wt Readings from Last 3 Encounters:  08/15/23 146 lb (66.2 kg)  08/05/23 144 lb 12.8 oz (65.7 kg)  08/01/23 146 lb 3.2 oz  (66.3 kg)    Cardiac Studies  CORONARY STENT INTERVENTION 05/2019  LEFT HEART CATH AND CORONARY ANGIOGRAPHY  Conclusion   1) Severe single vessel CAD with critical stenosis of the mid-circumflex, treated successfully with PTCA and stenting (2.5x12 mm Synergy DES 2) nonobstructive LAD stenosis 3) widely patent RCA, dominant vessel 4) normal LV systolic function   Recommend: DAPT with ASA and ticagrelor  x 12 months without interruption. Pt loaded with ticagrelor  180 mg on the cath lab table. DC home tomorrow am if no complications arise.      ASSESSMENT AND PLAN:  1  CAD  Pt remains asymptomatic  Follw   2  HTN  BP log from home overall is very good   I recomm she take 2.5 mg amlodipine  in am   If BP running high can  take an additional 2.5 mg     3  HL  LDL 58  HDL 64  Trig 98 Nov 2023  4.  PAD atherosclerosis of aorta  Continue risk factor modification  5  Fall  Pt admits to trying to carry too much    Encouraged her to work on balance   Drawbridge class sounds good    Follow up in Jan 2025  Current medicines are reviewed at length with the patient today.  The patient does not have concerns regarding medicines.  Signed, Vina Gull, MD  09/15/2023 9:26 PM    [facility] [address] [place], [place], [place]  [address] Phone: [phone]; Fax: [phone]

## 2023-09-16 ENCOUNTER — Ambulatory Visit: Payer: Medicare PPO | Admitting: Internal Medicine

## 2023-09-20 NOTE — Progress Notes (Signed)
83 y.o. No obstetric history on file. Widowed Caucasian female here for a NEW GYN/ breast and pelvic exam.    No current gynecologic concerns.   No vaginal bleeding.   Some constipation and treats with probiotic.   Up 2 times a night to empty her bladder, middle of night and early am.  No bladder control issues.   Husband passed 6 years ago, and patient has struggled with the loss.  He was an OB/GYN.  A mutual patient referred her to my care.   Lives independently in her home.   May move to [facility].  Daughter lives here.  PCP: Shelva Majestic, MD    Sexually active: No.  The current method of family planning is post menopausal status.    Menopausal hormone therapy:  n/a Exercising: Yes.     gym Smoker:  no  OB History     Gravida      Para      Term      Preterm      AB      Living  1      SAB      IAB      Ectopic      Multiple      Live Births              HEALTH MAINTENANCE: Last 2 paps: 11/23/16 neg History of abnormal Pap or positive HPV:  no Mammogram: 05/23/23 BI-RADS 1.  Had a L mastectomy.  Colonoscopy:  09/22/18 Bone Density:  10/29/16  Result  osteoporosis bilateral hips.   Immunization History  Administered Date(s) Administered   Fluad Quad(high Dose 65+) 04/29/2019, 05/12/2020, 06/01/2021, 05/31/2022, 06/05/2022   Fluad Trivalent(High Dose 65+) 05/31/2023   Influenza Split 07/24/2011   Influenza Whole 06/15/2009   Influenza, High Dose Seasonal PF 06/29/2013, 07/05/2015, 05/10/2016, 05/21/2017, 06/18/2018   Influenza,inj,Quad PF,6+ Mos 06/21/2014, 06/14/2016   PFIZER(Purple Top)SARS-COV-2 Vaccination 10/05/2019, 10/26/2019, 06/17/2020   Pfizer Covid-19 Vaccine Bivalent Booster 65yrs & up 07/10/2021   Pfizer(Comirnaty)Fall Seasonal Vaccine 12 years and older 08/10/2022, 05/31/2023   Pneumococcal Conjugate-13 04/15/2014   Pneumococcal Polysaccharide-23 07/17/2000, 10/02/2016   Respiratory Syncytial Virus Vaccine,Recomb  Aduvanted(Arexvy) 08/24/2022   Tdap 07/17/2010, 09/18/2022   Zoster Recombinant(Shingrix) 10/15/2018, 03/17/2019      reports that she has never smoked. She has never been exposed to tobacco smoke. She has never used smokeless tobacco. She reports current alcohol use. She reports that she does not use drugs.  Past Medical History:  Diagnosis Date   Abnormal thyroid function test 07/01/2013   Anxiety    Arthritis    Cancer (HCC)    Coronary artery disease    Dermatitis 11/25/2013   Glaucoma 11/02/2012   HTN (hypertension) 11/02/2012   hx: breast cancer, IDC (Microinvasive) w DCIS, receptor + her 2 - 07/10/2011   Hyperlipidemia    Hypothyroid    Obesity     Past Surgical History:  Procedure Laterality Date   AUGMENTATION MAMMAPLASTY Left    05/2006   BREAST SURGERY  10/2005   Left Mastectomy, reconstruction, reduction on right. Dr. Odis Luster   CARDIAC CATHETERIZATION     COLONOSCOPY     CORONARY STENT INTERVENTION N/A 06/01/2019   Procedure: CORONARY STENT INTERVENTION;  Surgeon: Tonny Bollman, MD;  Location: [facility] INVASIVE CV LAB;  Service: Cardiovascular;  Laterality: N/A;   CRYOTHERAPY     GYN for CIN III   DILATION AND CURETTAGE OF UTERUS     EXCISION MORTON'S NEUROMA  right foot   EYE SURGERY Right 05/08/2018   LEFT HEART CATH AND CORONARY ANGIOGRAPHY N/A 06/01/2019   Procedure: LEFT HEART CATH AND CORONARY ANGIOGRAPHY;  Surgeon: Tonny Bollman, MD;  Location: [facility] INVASIVE CV LAB;  Service: Cardiovascular;  Laterality: N/A;   MASTECTOMY     REDUCTION MAMMAPLASTY Right     Current Outpatient Medications  Medication Sig Dispense Refill   amLODipine (NORVASC) 2.5 MG tablet Take 1 tablet (2.5 mg total) by mouth daily.     Ascorbic Acid (VITAMIN C PO) Take by mouth. 1 gummy daily     aspirin EC 81 MG tablet Take 81 mg by mouth daily.     Bimatoprost (LUMIGAN OP) Place 1 drop into both eyes at bedtime.      chlorthalidone (HYGROTON) 25 MG tablet Take 1 tablet (25 mg total)  by mouth daily. Pt needs to keep upcoming appt in Dec for further refills 90 tablet 1   Cholecalciferol (VITAMIN D-3) 5000 UNITS TABS Take 5,000 mg by mouth daily.     dorzolamide-timolol (COSOPT) 22.3-6.8 MG/ML ophthalmic solution Place 1 drop into both eyes 2 (two) times daily.      Evolocumab (REPATHA SURECLICK) 140 MG/ML SOAJ INJECT 140MG  UNDER THE SKIN EVERY 2 WEEKS 6 mL 3   famotidine (PEPCID) 20 MG tablet TAKE 1 TABLET BY MOUTH TWICE DAILY. 180 tablet 3   icosapent Ethyl (VASCEPA) 1 g capsule TAKE 1 CAPSULE TWICE DAILY 180 capsule 1   levothyroxine (SYNTHROID) 50 MCG tablet TAKE ONE TABLET BY MOUTH BEFORE BREAKFAST 90 tablet 3   metoprolol tartrate (LOPRESSOR) 25 MG tablet TAKE ONE TABLET BY MOUTH TWICE DAILY 180 tablet 0   moxifloxacin (VIGAMOX) 0.5 % ophthalmic solution Place into the right eye.     NEXLETOL 180 MG TABS TAKE 1 TABLET EVERY DAY 90 tablet 3   nitroGLYCERIN (NITROSTAT) 0.4 MG SL tablet Place 1 tablet (0.4 mg total) under the tongue every 5 (five) minutes x 3 doses as needed for chest pain. Pt needs to keep upcoming appt in Dec for further refills 25 tablet 0   pilocarpine (PILOCAR) 1 % ophthalmic solution Place 1 drop into both eyes 2 (two) times daily.      Polyethyl Glycol-Propyl Glycol (SYSTANE OP) Place 1-2 drops into both eyes as needed (for dry eyes).      polyethylene glycol powder (GLYCOLAX/MIRALAX) 17 GM/SCOOP powder Take 1 Container by mouth daily.     potassium chloride (KLOR-CON) 10 MEQ tablet Take 2 tablets (20 mEq total) by mouth 2 (two) times daily. 240 tablet 3   prednisoLONE acetate (PRED FORTE) 1 % ophthalmic suspension Place 1 drop into the right eye 4 (four) times daily.     Probiotic Product (PROBIOTIC DAILY) CAPS Take 1 capsule by mouth daily. Reported on 10/10/2015     telmisartan (MICARDIS) 40 MG tablet Take 1 tablet (40 mg total) by mouth daily. 90 tablet 3   Wheat Dextrin (BENEFIBER DRINK MIX PO) Take by mouth.     Wheat Dextrin (BENEFIBER DRINK MIX)  PACK Take by mouth.     No current facility-administered medications for this visit.    ALLERGIES: Adhesive [tape] and Rosuvastatin  Family History  Problem Relation Age of Onset   Pneumonia Mother    Hyperlipidemia Mother    Parkinsonism Mother    CAD Mother    Other Father        bilateral subdural hematoma   Hyperlipidemia Sister    Colon cancer Paternal Grandmother  Liver disease Neg Hx    Esophageal cancer Neg Hx     Review of Systems  All other systems reviewed and are negative.   PHYSICAL EXAM:  BP 126/82 (BP Location: Right Arm, Patient Position: Sitting, Cuff Size: Small)   Pulse 78   Ht 5' 1.75" (1.568 m)   Wt 144 lb (65.3 kg)   SpO2 100%   BMI 26.55 kg/m     General appearance: alert, cooperative and appears stated age Head: normocephalic, without obvious abnormality, atraumatic Neck: no adenopathy, supple, symmetrical, trachea midline and thyroid normal to inspection and palpation Lungs: clear to auscultation bilaterally Breasts: normal appearance, no masses or tenderness, No nipple retraction or dimpling, No nipple discharge or bleeding, No axillary adenopathy Heart: regular rate and rhythm Abdomen: soft, non-tender; no masses, no organomegaly Extremities: extremities normal, atraumatic, no cyanosis or edema Skin: skin color, texture, turgor normal. No rashes or lesions Lymph nodes: cervical, supraclavicular, and axillary nodes normal. Neurologic: grossly normal  Pelvic: External genitalia:  no lesions              No abnormal inguinal nodes palpated.              Urethra:  normal appearing urethra with no masses, tenderness or lesions              Bartholins and Skenes: normal                 Vagina: normal appearing vagina with normal color and discharge, no lesions              Cervix: no lesions              Pap taken: yes Bimanual Exam:  Uterus:  normal size, contour, position, consistency, mobility, non-tender              Adnexa: no mass,  fullness, tenderness              Rectal exam: yes.  Confirms.              Anus:  normal sphincter tone, no lesions  Chaperone was present for exam:  Warren Lacy, CMA  ASSESSMENT: Encounter for breast and pelvic exam.  Cervical cancer screening.  Hx left breast caner.  Status post left mastectomy with reconstruction.  Osteoporosis of bilateral hips.   PLAN: Mammogram screening discussed. Self breast awareness reviewed. Pap and HRV collected:  yes Guidelines for Calcium, Vitamin D, regular exercise program including cardiovascular and weight bearing exercise. Medication refills:  NA We reviewed her bone density from [facility] from 2018 showing osteoporosis of hips. Will assist with scheduling bone density.   I have signed an order to fax to [facility]  Patient will then need to return for an office visit to discuss results.  Follow up:  next breast and pelvic exam in 2 years.    30 min  total time was spent for this patient encounter, including preparation, face-to-face counseling with the patient, coordination of care, and documentation of the encounter in addition to doing the breast and pelvic exam and pap.

## 2023-09-30 DIAGNOSIS — C50912 Malignant neoplasm of unspecified site of left female breast: Secondary | ICD-10-CM | POA: Diagnosis not present

## 2023-10-02 ENCOUNTER — Encounter: Payer: Self-pay | Admitting: Family Medicine

## 2023-10-02 ENCOUNTER — Ambulatory Visit: Payer: Medicare PPO | Admitting: Family Medicine

## 2023-10-02 VITALS — BP 122/64 | HR 78 | Temp 97.8°F | Ht 63.0 in | Wt 143.4 lb

## 2023-10-02 DIAGNOSIS — I10 Essential (primary) hypertension: Secondary | ICD-10-CM

## 2023-10-02 DIAGNOSIS — E782 Mixed hyperlipidemia: Secondary | ICD-10-CM | POA: Diagnosis not present

## 2023-10-02 DIAGNOSIS — I2510 Atherosclerotic heart disease of native coronary artery without angina pectoris: Secondary | ICD-10-CM

## 2023-10-02 DIAGNOSIS — E039 Hypothyroidism, unspecified: Secondary | ICD-10-CM | POA: Diagnosis not present

## 2023-10-02 DIAGNOSIS — G720 Drug-induced myopathy: Secondary | ICD-10-CM | POA: Diagnosis not present

## 2023-10-02 NOTE — Patient Instructions (Addendum)
No labs today- you are doing really well!   Recommended follow up: Return in about 2 months (around 11/30/2023) for followup or sooner if needed.Schedule b4 you leave.

## 2023-10-02 NOTE — Progress Notes (Signed)
Phone [phone] In person visit   Subjective:   [name] is a 83 y.o. year old very pleasant female patient who presents for/with See problem oriented charting Chief Complaint  Patient presents with   2 month f/u    Pt states she thinks she has "toasted skin syndrome" from her heater.   Colonoscopy    Isnt sure if she should still be getting them   Past Medical History-  Patient Active Problem List   Diagnosis Date Noted   CAD (coronary artery disease) s/p stent mCx 05/2019 06/02/2019    Priority: High   Grieving 02/18/2017    Priority: High   Osteoporosis 11/05/2016    Priority: High   Anxiety 11/27/2022    Priority: Medium    Aortic atherosclerosis (HCC) 09/15/2020    Priority: Medium    Chest pain 12/29/2018    Priority: Medium    Primary open-angle glaucoma 11/20/2017    Priority: Medium    Hypothyroidism 07/01/2013    Priority: Medium    Hypertension 11/02/2012    Priority: Medium    Hyperlipidemia 07/24/2011    Priority: Medium    hx: breast cancer, IDC (Microinvasive) w DCIS, receptor + her 2 - 07/10/2011    Priority: Medium    History of adenomatous polyp of colon 09/30/2018    Priority: Low   Medicare annual wellness visit, subsequent 09/26/2014    Priority: Low   Vitamin D deficiency 11/02/2012    Priority: Low   Obesity (BMI 30-39.9) 07/24/2011    Priority: Low   Statin myopathy 07/01/2019    Medications- reviewed and updated Current Outpatient Medications  Medication Sig Dispense Refill   amLODipine (NORVASC) 2.5 MG tablet Take 1 tablet (2.5 mg total) by mouth daily.     Ascorbic Acid (VITAMIN C PO) Take by mouth. 1 gummy daily     aspirin EC 81 MG tablet Take 81 mg by mouth daily.     Bimatoprost (LUMIGAN OP) Place 1 drop into both eyes at bedtime.      chlorthalidone (HYGROTON) 25 MG tablet Take 1 tablet (25 mg total) by mouth daily. Pt needs to keep upcoming appt in Dec for further refills 90 tablet 1   Cholecalciferol (VITAMIN D-3)  5000 UNITS TABS Take 5,000 mg by mouth daily.     dorzolamide-timolol (COSOPT) 22.3-6.8 MG/ML ophthalmic solution Place 1 drop into both eyes 2 (two) times daily.      Evolocumab (REPATHA SURECLICK) 140 MG/ML SOAJ INJECT 140MG  UNDER THE SKIN EVERY 2 WEEKS 6 mL 3   famotidine (PEPCID) 20 MG tablet TAKE 1 TABLET BY MOUTH TWICE DAILY. 180 tablet 3   icosapent Ethyl (VASCEPA) 1 g capsule TAKE 1 CAPSULE TWICE DAILY 180 capsule 1   levothyroxine (SYNTHROID) 50 MCG tablet TAKE ONE TABLET BY MOUTH BEFORE BREAKFAST 90 tablet 3   metoprolol tartrate (LOPRESSOR) 25 MG tablet TAKE ONE TABLET BY MOUTH TWICE DAILY 180 tablet 0   moxifloxacin (VIGAMOX) 0.5 % ophthalmic solution Place into the right eye.     NEXLETOL 180 MG TABS TAKE 1 TABLET EVERY DAY 90 tablet 3   nitroGLYCERIN (NITROSTAT) 0.4 MG SL tablet Place 1 tablet (0.4 mg total) under the tongue every 5 (five) minutes x 3 doses as needed for chest pain. Pt needs to keep upcoming appt in Dec for further refills 25 tablet 0   pilocarpine (PILOCAR) 1 % ophthalmic solution Place 1 drop into both eyes 2 (two) times daily.      Polyethyl  Glycol-Propyl Glycol (SYSTANE OP) Place 1-2 drops into both eyes as needed (for dry eyes).      polyethylene glycol powder (GLYCOLAX/MIRALAX) 17 GM/SCOOP powder Take 1 Container by mouth daily.     potassium chloride (KLOR-CON) 10 MEQ tablet Take 2 tablets (20 mEq total) by mouth 2 (two) times daily. 240 tablet 3   prednisoLONE acetate (PRED FORTE) 1 % ophthalmic suspension Place 1 drop into the right eye 4 (four) times daily.     Probiotic Product (PROBIOTIC DAILY) CAPS Take 1 capsule by mouth daily. Reported on 10/10/2015     telmisartan (MICARDIS) 40 MG tablet Take 1 tablet (40 mg total) by mouth daily. 90 tablet 3   Wheat Dextrin (BENEFIBER DRINK MIX PO) Take by mouth.     Wheat Dextrin (BENEFIBER DRINK MIX) PACK Take by mouth.     No current facility-administered medications for this visit.     Objective:  BP 122/64    Pulse 78   Temp 97.8 F (36.6 C)   Ht 5\' 3"  (1.6 m)   Wt 143 lb 6.4 oz (65 kg)   SpO2 98%   BMI 25.40 kg/m  Gen: NAD, resting comfortably CV: RRR no murmurs rubs or gallops Lungs: CTAB no crackles, wheeze, rhonchi Ext: trace edema Skin: warm, dry     Assessment and Plan   # Prolonged grieving/depression/anxiety-extremely close to her now deceased spouse and challenging transition since his passing February 2019 - had challenge with insurance covering Dr. Monna Fam -she was planning of the move to [facility] if opening- needs more than 1 bedroom - have considered sertraline or Prozac as has worried about weight gain - was a hard holiday season for her but she feels she is making it - still doing grief group with church, good church support - she wants to hold off on medication for anxiety/grief  #CAD-follows with Dr. Tenny Craw on aspirin #aortic atherosclerosis  #hyperlipidemia- statin myalgia S: Medication: Repatha 140 mg every 2 weeks, nexletol/bempedoic acid 180mg  daily and Vascepa 1 g twice daily  (prior clear study- stopped mid 2022), aspirin 81 mg -no chest pain or shortness of breath  Lab Results  Component Value Date   CHOL 130 05/31/2023   HDL 65.30 05/31/2023   LDLCALC 48 05/31/2023   LDLDIRECT 54.0 06/01/2021   TRIG 84.0 05/31/2023   CHOLHDL 2 05/31/2023   A/P: coronary artery disease asymptomatic - continue current medications . Statin myalgia noted Cholesterol looked great in September- continue current medications  Aortic atherosclerosis (presumed stable)- LDL goal ideally <70 - at goal- continue current medications   #hypertension S: medication: Metoprolol 25 mg twice daily, amlodipine 2.5 mg (with 2nd dose if needed for high BP over 150 given variability), telmisartan 40 mg per Dr. Tenny Craw, chlorthalidone 25 mg -member at [place] [facility] for exercise- plans to get back to gym  -average in 110s over 60's at home BP Readings from Last 3 Encounters:  10/02/23 122/64   08/15/23 118/68  08/05/23 122/78  A/P: stable- continue current medicines   #hypothyroidism S: compliant On thyroid medication-levothyroxine 50 mcg Lab Results  Component Value Date   TSH 1.72 08/01/2023   A/P: stable- continue current medicines    #some skin discoloration redness without warmth or pain in right lower leg - uses a heater right next to her- advised her to move this away or at least give a break  #per gastrointestinal on 08/15/23- no further colonoscopies  Recommended follow up: Return in about 2 months (around 11/30/2023) for  followup or sooner if needed.Schedule b4 you leave. Future Appointments  Date Time Provider Department Center  10/03/2023 11:00 AM Patton Salles, MD [facility] None  12/09/2023 10:40 AM Pricilla Riffle, MD [facility]  08/17/2024  8:00 AM [facility] ANNUAL WELLNESS VISIT 1 [facility]   Lab/Order associations:   ICD-10-CM   1. Coronary artery disease involving native coronary artery of native heart without angina pectoris  I25.10     2. Primary hypertension  I10     3. Mixed hyperlipidemia  E78.2     4. Hypothyroidism, unspecified type  E03.9     5. Drug-induced myopathy  G72.0      No orders of the defined types were placed in this encounter.  Return precautions advised.  Tana Conch, MD

## 2023-10-03 ENCOUNTER — Ambulatory Visit: Payer: Medicare PPO | Admitting: Obstetrics and Gynecology

## 2023-10-03 ENCOUNTER — Other Ambulatory Visit (HOSPITAL_COMMUNITY)
Admission: RE | Admit: 2023-10-03 | Discharge: 2023-10-03 | Disposition: A | Discharge status: Home / Self Care | Payer: Medicare PPO | Source: Ambulatory Visit | Attending: Obstetrics and Gynecology | Admitting: Obstetrics and Gynecology

## 2023-10-03 ENCOUNTER — Telehealth: Payer: Self-pay | Admitting: Obstetrics and Gynecology

## 2023-10-03 ENCOUNTER — Encounter: Payer: Self-pay | Admitting: Obstetrics and Gynecology

## 2023-10-03 VITALS — BP 126/82 | HR 78 | Ht 61.75 in | Wt 144.0 lb

## 2023-10-03 DIAGNOSIS — M810 Age-related osteoporosis without current pathological fracture: Secondary | ICD-10-CM

## 2023-10-03 DIAGNOSIS — Z124 Encounter for screening for malignant neoplasm of cervix: Secondary | ICD-10-CM

## 2023-10-03 DIAGNOSIS — Z01419 Encounter for gynecological examination (general) (routine) without abnormal findings: Secondary | ICD-10-CM | POA: Diagnosis not present

## 2023-10-03 DIAGNOSIS — Z1151 Encounter for screening for human papillomavirus (HPV): Secondary | ICD-10-CM | POA: Diagnosis not present

## 2023-10-03 DIAGNOSIS — Z853 Personal history of malignant neoplasm of breast: Secondary | ICD-10-CM | POA: Diagnosis not present

## 2023-10-03 NOTE — Patient Instructions (Signed)

## 2023-10-03 NOTE — Telephone Encounter (Signed)
Order has been faxed to [facility] per EF. Will contact tomorrow to see if the pt is scheduled w/ [facility].

## 2023-10-03 NOTE — Telephone Encounter (Signed)
Please assist in scheduling bone density at [facility] for my patient.   She has osteoporosis of her bilateral hips.   I have signed an order for [facility] in [place] and on paper.

## 2023-10-07 ENCOUNTER — Encounter: Payer: Self-pay | Admitting: Internal Medicine

## 2023-10-07 ENCOUNTER — Ambulatory Visit: Payer: Medicare PPO | Attending: Internal Medicine | Admitting: Internal Medicine

## 2023-10-07 ENCOUNTER — Encounter: Payer: Self-pay | Admitting: Obstetrics and Gynecology

## 2023-10-07 VITALS — BP 118/52 | HR 64 | Ht 61.75 in | Wt 144.6 lb

## 2023-10-07 DIAGNOSIS — I2510 Atherosclerotic heart disease of native coronary artery without angina pectoris: Secondary | ICD-10-CM | POA: Diagnosis not present

## 2023-10-07 LAB — CYTOLOGY - PAP
Comment: NEGATIVE
Diagnosis: NEGATIVE
High risk HPV: NEGATIVE

## 2023-10-07 NOTE — Progress Notes (Signed)
Cardiology Office Note   Date:  10/07/2023   ID:  [name], [name] Jan 18, 1941, MRN [id number]  PCP:  Shelva Majestic, MD  Cardiologist:   Dietrich Pates, MD    Patient presents for f/u of CAD    History of Present Illness: [name] is a 83 y.o. female with a history of HTN, HL and  CAD   April 2020 admitted for CP.  CT chest/abd showed extensive atherosclerosis. CCTA showed no obstructive lesons    September 2020 she was admtted with an NSTEMI.  LHC showed severe single-vessel disease with a critical stenosis of the left circumflex.  She underwent PTCA/DES to this area.  There was nonobstructive disease noted in the LAD.  Plan for dual antiplatelet therapy for 12 month April 2022 chest pressure   Felt noncardiac   [name] was normal    I saw the pt last summer  Since seen she has done well   Active   [facility]    Denies CP  Breathing is good  No dizziness  No SOB  BP readings at home   100s to 120s   /      Current Meds  Medication Sig   amLODipine (NORVASC) 5 MG tablet Take 2.5 mg by mouth daily.   Ascorbic Acid (VITAMIN C PO) Take by mouth. 1 gummy daily   aspirin EC 81 MG tablet Take 81 mg by mouth daily.   Bimatoprost (LUMIGAN OP) Place 1 drop into both eyes at bedtime.    chlorthalidone (HYGROTON) 25 MG tablet Take 1 tablet (25 mg total) by mouth daily. Pt needs to keep upcoming appt in Dec for further refills   Cholecalciferol (VITAMIN D-3) 5000 UNITS TABS Take 5,000 mg by mouth daily.   dorzolamide-timolol (COSOPT) 22.3-6.8 MG/ML ophthalmic solution Place 1 drop into both eyes 2 (two) times daily.    Evolocumab (REPATHA SURECLICK) 140 MG/ML SOAJ INJECT 140MG  UNDER THE SKIN EVERY 2 WEEKS   famotidine (PEPCID) 20 MG tablet TAKE 1 TABLET BY MOUTH TWICE DAILY.   icosapent Ethyl (VASCEPA) 1 g capsule TAKE 1 CAPSULE TWICE DAILY   levothyroxine (SYNTHROID) 50 MCG tablet TAKE ONE TABLET BY MOUTH BEFORE BREAKFAST   metoprolol tartrate (LOPRESSOR) 25 MG  tablet TAKE ONE TABLET BY MOUTH TWICE DAILY   moxifloxacin (VIGAMOX) 0.5 % ophthalmic solution Place into the right eye.   NEXLETOL 180 MG TABS TAKE 1 TABLET EVERY DAY   nitroGLYCERIN (NITROSTAT) 0.4 MG SL tablet Place 1 tablet (0.4 mg total) under the tongue every 5 (five) minutes x 3 doses as needed for chest pain. Pt needs to keep upcoming appt in Dec for further refills   pilocarpine (PILOCAR) 1 % ophthalmic solution Place 1 drop into both eyes 2 (two) times daily.    Polyethyl Glycol-Propyl Glycol (SYSTANE OP) Place 1-2 drops into both eyes as needed (for dry eyes).    polyethylene glycol powder (GLYCOLAX/MIRALAX) 17 GM/SCOOP powder Take 1 Container by mouth daily.   potassium chloride (KLOR-CON) 10 MEQ tablet Take 2 tablets (20 mEq total) by mouth 2 (two) times daily.   prednisoLONE acetate (PRED FORTE) 1 % ophthalmic suspension Place 1 drop into the right eye 4 (four) times daily.   Probiotic Product (PROBIOTIC DAILY) CAPS Take 1 capsule by mouth daily. Reported on 10/10/2015   telmisartan (MICARDIS) 40 MG tablet Take 1 tablet (40 mg total) by mouth daily.   Wheat Dextrin (BENEFIBER DRINK MIX PO) Take by mouth.  Wheat Dextrin (BENEFIBER DRINK MIX) PACK Take by mouth.     Allergies:   Adhesive [tape] and Rosuvastatin   Past Medical History:  Diagnosis Date   Abnormal thyroid function test 07/01/2013   Anxiety    Arthritis    Cancer (HCC)    Coronary artery disease    Dermatitis 11/25/2013   Glaucoma 11/02/2012   HTN (hypertension) 11/02/2012   hx: breast cancer, IDC (Microinvasive) w DCIS, receptor + her 2 - 07/10/2011   Hyperlipidemia    Hypothyroid    Obesity     Past Surgical History:  Procedure Laterality Date   AUGMENTATION MAMMAPLASTY Left    05/2006   BREAST SURGERY  10/2005   Left Mastectomy, reconstruction, reduction on right. Dr. Odis Luster   CARDIAC CATHETERIZATION     COLONOSCOPY     CORONARY STENT INTERVENTION N/A 06/01/2019   Procedure: CORONARY STENT  INTERVENTION;  Surgeon: Tonny Bollman, MD;  [facility] INVAS[facility] CV LAB;  Service: Cardiovascular;  Laterality: N/A;   CRYOTHERAPY     GYN for CIN III   DILATION AND CURETTAGE OF UTERUS     EXCISION MORTON'S NEUROMA     right foot   EYE SURGERY Right 05/08/2018   LEFT HEART CATH AND CORONARY ANGIOGRAPHY N/A 06/01/2019   Procedure: LEFT HEART CATH AND CORONARY ANGIOGRAPHY;  Surgeon: Tonny Bollman, MD;  Location: [facility] INVASIVE CV LAB;  Service: Cardiovascular;  Laterality: N/A;   MASTECTOMY     REDUCTION MAMMAPLASTY Right      Social History:  The patient  reports that she has never smoked. She has never been exposed to tobacco smoke. She has never used smokeless tobacco. She reports current alcohol use. She reports that she does not use drugs.   Family History:  The patient's family history includes CAD in her mother; Colon cancer in her paternal grandmother; Hyperlipidemia in her mother and sister; Other in her father; Parkinsonism in her mother; Pneumonia in her mother.    ROS:  Please see the history of present illness. All other systems are reviewed and  Negative to the above problem except as noted.    PHYSICAL EXAM: VS:  BP (!) 118/52   Pulse 64   Ht 5' 1.75" (1.568 m)   Wt 144 lb 9.6 oz (65.6 kg)   SpO2 98%   BMI 26.66 kg/m   GEN: Pt is a 83 yo female in no acute distress  HEENT: normal  Neck: no JVD   Cardiac: RRR; no murmur  Respiratory:  Ext  no LE edema    EKG:  EKG is not ordered today.     Lipid Panel    Component Value Date/Time   CHOL 130 05/31/2023 1111   CHOL 131 10/27/2020 0936   TRIG 84.0 05/31/2023 1111   HDL 65.30 05/31/2023 1111   HDL 64 10/27/2020 0936   CHOLHDL 2 05/31/2023 1111   VLDL 16.8 05/31/2023 1111   LDLCALC 48 05/31/2023 1111   LDLCALC 53 10/27/2020 0936   LDLCALC 30 05/12/2020 1136   LDLDIRECT 54.0 06/01/2021 0829      Wt Readings from Last 3 Encounters:  10/07/23 144 lb 9.6 oz (65.6 kg)  10/03/23 144 lb (65.3 kg)   10/02/23 143 lb 6.4 oz (65 kg)    Cardiac Studies  CORONARY STENT INTERVENTION 05/2019  LEFT HEART CATH AND CORONARY ANGIOGRAPHY  Conclusion   1) Severe single vessel CAD with critical stenosis of the mid-circumflex, treated successfully with PTCA and stenting (2.5x12 mm Synergy DES  2) nonobstructive LAD stenosis 3) widely patent RCA, dominant vessel 4) normal LV systolic function   Recommend: DAPT with ASA and ticagrelor x 12 months without interruption. Pt loaded with ticagrelor 180 mg on the cath lab table. DC home tomorrow am if no complications arise.      ASSESSMENT AND PLAN:  1  CAD  s/p PTCA/DES in 2020   Doing well  No symptoms of angina  2  HTN  BPs from home excellent   Keep on current regimen  Call if dizzy   Stay hydrated     3  HL  LDL 48  HDL 65  Trig 84   Keep on current regimen   4.  PAD atherosclerosis of aorta   Follow up in Fall 2025  Current medicines are reviewed at length with the patient today.  The patient does not have concerns regarding medicines.  Signed, Dietrich Pates, MD  10/07/2023 4:43 PM    [facility] [address] [place], [place], [place]  [address] Phone: [phone]; Fax: [phone]

## 2023-10-07 NOTE — Patient Instructions (Signed)
Medication Instructions:   *If you need a refill on your cardiac medications before your next appointment, please call your pharmacy*   Lab Work:  If you have labs (blood work) drawn today and your tests are completely normal, you will receive your results only by: MyChart Message (if you have MyChart) OR A paper copy in the mail If you have any lab test that is abnormal or we need to change your treatment, we will call you to review the results.   Testing/Procedures:    Follow-Up: At [facility], you and your health needs are our priority.  As part of our continuing mission to provide you with exceptional heart care, we have created designated Provider Care Teams.  These Care Teams include your primary Cardiologist (physician) and Advanced Practice Providers (APPs -  Physician Assistants and Nurse Practitioners) who all work together to provide you with the care you need, when you need it.  We recommend signing up for the patient portal called "MyChart".  Sign up information is provided on this After Visit Summary.  MyChart is used to connect with patients for Virtual Visits (Telemedicine).  Patients are able to view lab/test results, encounter notes, upcoming appointments, etc.  Non-urgent messages can be sent to your provider as well.   To learn more about what you can do with MyChart, go to [email or web address].    Your next appointment:  Aug 2025

## 2023-10-09 NOTE — Telephone Encounter (Signed)
Spoke w/ Trula Ore w/ [facility] and she said that they dont see an order yet for her. Will re-fax at my earliest convenience.

## 2023-10-10 DIAGNOSIS — C50912 Malignant neoplasm of unspecified site of left female breast: Secondary | ICD-10-CM | POA: Diagnosis not present

## 2023-10-10 NOTE — Telephone Encounter (Signed)
Order re-faxed successfully on 10/09/2023

## 2023-10-15 DIAGNOSIS — C50912 Malignant neoplasm of unspecified site of left female breast: Secondary | ICD-10-CM | POA: Diagnosis not present

## 2023-10-17 NOTE — Telephone Encounter (Signed)
 [facility] states they do have the bmd order. They reached out to the patient & got her voicemail. They said they will reach out to her 2 more times to try to get her scheduled

## 2023-10-21 ENCOUNTER — Emergency Department (HOSPITAL_BASED_OUTPATIENT_CLINIC_OR_DEPARTMENT_OTHER)
Admission: EM | Admit: 2023-10-21 | Discharge: 2023-10-21 | Disposition: A | Discharge status: Home / Self Care | Payer: Medicare PPO | Attending: Emergency Medicine | Admitting: Emergency Medicine

## 2023-10-21 ENCOUNTER — Other Ambulatory Visit: Payer: Self-pay

## 2023-10-21 ENCOUNTER — Emergency Department (HOSPITAL_BASED_OUTPATIENT_CLINIC_OR_DEPARTMENT_OTHER): Payer: Medicare PPO | Admitting: Radiology

## 2023-10-21 ENCOUNTER — Encounter (HOSPITAL_BASED_OUTPATIENT_CLINIC_OR_DEPARTMENT_OTHER): Payer: Self-pay | Admitting: Emergency Medicine

## 2023-10-21 ENCOUNTER — Telehealth: Payer: Self-pay | Admitting: Internal Medicine

## 2023-10-21 DIAGNOSIS — R0789 Other chest pain: Secondary | ICD-10-CM | POA: Diagnosis not present

## 2023-10-21 DIAGNOSIS — Z7982 Long term (current) use of aspirin: Secondary | ICD-10-CM | POA: Diagnosis not present

## 2023-10-21 DIAGNOSIS — I2510 Atherosclerotic heart disease of native coronary artery without angina pectoris: Secondary | ICD-10-CM | POA: Diagnosis not present

## 2023-10-21 DIAGNOSIS — Z853 Personal history of malignant neoplasm of breast: Secondary | ICD-10-CM | POA: Insufficient documentation

## 2023-10-21 DIAGNOSIS — R7989 Other specified abnormal findings of blood chemistry: Secondary | ICD-10-CM | POA: Insufficient documentation

## 2023-10-21 DIAGNOSIS — R079 Chest pain, unspecified: Secondary | ICD-10-CM | POA: Diagnosis not present

## 2023-10-21 LAB — BASIC METABOLIC PANEL
Anion gap: 10 (ref 5–15)
BUN: 17 mg/dL (ref 8–23)
CO2: 27 mmol/L (ref 22–32)
Calcium: 10 mg/dL (ref 8.9–10.3)
Chloride: 105 mmol/L (ref 98–111)
Creatinine, Ser: 1.09 mg/dL — ABNORMAL HIGH (ref 0.44–1.00)
GFR, Estimated: 51 mL/min — ABNORMAL LOW (ref >60)
Glucose, Bld: 95 mg/dL (ref 70–99)
Potassium: 4 mmol/L (ref 3.5–5.1)
Sodium: 142 mmol/L (ref 135–145)

## 2023-10-21 LAB — CBC
HCT: 38 % (ref 36.0–46.0)
Hemoglobin: 12.3 g/dL (ref 12.0–15.0)
MCH: 30.5 pg (ref 26.0–34.0)
MCHC: 32.4 g/dL (ref 30.0–36.0)
MCV: 94.3 fL (ref 80.0–100.0)
Platelets: 305 10*3/uL (ref 150–400)
RBC: 4.03 MIL/uL (ref 3.87–5.11)
RDW: 12.5 % (ref 11.5–15.5)
WBC: 6.8 10*3/uL (ref 4.0–10.5)
nRBC: 0 % (ref 0.0–0.2)

## 2023-10-21 LAB — TROPONIN I (HIGH SENSITIVITY)
Troponin I (High Sensitivity): 3 ng/L (ref <18)
Troponin I (High Sensitivity): 2 ng/L (ref <18)

## 2023-10-21 MED ORDER — ALUM & MAG HYDROXIDE-SIMETH 200-200-20 MG/5ML PO SUSP
30.0000 mL | Freq: Once | ORAL | Status: AC
  Administered 2023-10-21: 30 mL via ORAL
  Filled 2023-10-21: qty 30

## 2023-10-21 MED ORDER — NITROGLYCERIN 0.4 MG SL SUBL
0.4000 mg | SUBLINGUAL_TABLET | SUBLINGUAL | 0 refills | Status: AC | PRN
Start: 2023-10-21 — End: ?

## 2023-10-21 MED ORDER — LIDOCAINE VISCOUS HCL 2 % MT SOLN
15.0000 mL | Freq: Once | OROMUCOSAL | Status: AC
  Administered 2023-10-21: 15 mL via ORAL
  Filled 2023-10-21: qty 15

## 2023-10-21 NOTE — Discharge Instructions (Addendum)
 Today you are seen for atypical chest pain.  You may take over-the-counter Mylanta as needed for acid reflux.  If your symptoms persist please follow-up with your primary care or cardiologist for further evaluation and treatment.  Thank you for letting us  treat you today. After reviewing your labs and imaging, I feel you are safe to go home. Please follow up with your PCP in the next several days and provide them with your records from this visit. Return to the Emergency Room if pain becomes severe or symptoms worsen.

## 2023-10-21 NOTE — Telephone Encounter (Signed)
 Patient states she has chest pain/tightness that started last night. She reports it is coming and going but currently feeling pain/tightness during phone call. Denies SOB, N/V, diaphoresis.  Advised patient to take NTG and if no relief to go to ED for evaluation.   Patient reports she thought her bra might have been too tight, she removed it and "let out a big burp" with no relief.   Scheduled appt with DOD Dr. Katheryne Pane for 10/22/23 at 1:15pm, patient will cancel if not needed after ED evaluation today.

## 2023-10-21 NOTE — Telephone Encounter (Signed)
   Pt c/o of Chest Pain: STAT if active CP, including tightness, pressure, jaw pain, radiating pain to shoulder/upper arm/back, CP unrelieved by Nitro. Symptoms reported of SOB, nausea, vomiting, sweating.  1. Are you having CP right now? No but it occurred last night. She states she thinks its indigestion. She states she had a tight bra on and when she took it off she burped heavily, however the pain it still coming and going.    2. Are you experiencing any other symptoms (ex. SOB, nausea, vomiting, sweating)? No    3. Is your CP continuous or coming and going? Coming and going    4. Have you taken Nitroglycerin ? No    5. How long have you been experiencing CP?  Few days    6. If NO CP at time of call then end call with telling Pt to call back or call 911 if Chest pain returns prior to return call from triage team.

## 2023-10-21 NOTE — ED Notes (Signed)
 Patient transported to X-ray

## 2023-10-21 NOTE — ED Provider Notes (Signed)
[place] EMERGENCY DEPARTMENT AT [facility] Provider Note   CSN: [id number] Arrival date & time: 10/21/23  1255     History  No chief complaint on file.   [name] is a 83 y.o. female past medical history significant for CAD, breast cancer presents today for chest pain that began last night.  Patient states she was wearing a tighter fitting bra yesterday and believe that to be the source of the pain.  The patient also noted some increase in belching since the pain started and thought indigestion could also be a cause, but came to the ED since she has a history of heart attack.  Patient denies numbness, tingling, pain radiation, nausea, vomiting, fever, or shortness of breath.  HPI     Home Medications Prior to Admission medications   Medication Sig Start Date End Date Taking? Authorizing Provider  amLODipine (NORVASC) 5 MG tablet Take 2.5 mg by mouth daily.    [provider]  Ascorbic Acid (VITAMIN C PO) Take by mouth. 1 gummy daily    [provider]  aspirin EC 81 MG tablet Take 81 mg by mouth daily.    [provider]  Bimatoprost (LUMIGAN OP) Place 1 drop into both eyes at bedtime.     [provider]  chlorthalidone (HYGROTON) 25 MG tablet Take 1 tablet (25 mg total) by mouth daily. Pt needs to keep upcoming appt in Dec for further refills 08/27/23   Pricilla Riffle, MD  Cholecalciferol (VITAMIN D-3) 5000 UNITS TABS Take 5,000 mg by mouth daily.    [provider]  dorzolamide-timolol (COSOPT) 22.3-6.8 MG/ML ophthalmic solution Place 1 drop into both eyes 2 (two) times daily.     [provider]  Evolocumab (REPATHA SURECLICK) 140 MG/ML SOAJ INJECT 140MG  UNDER THE SKIN EVERY 2 WEEKS 02/12/23   Pricilla Riffle, MD  famotidine (PEPCID) 20 MG tablet TAKE 1 TABLET BY MOUTH TWICE DAILY. 05/29/23   Shelva Majestic, MD  icosapent Ethyl (VASCEPA) 1 g capsule TAKE 1 CAPSULE TWICE DAILY 08/05/23   Pricilla Riffle, MD   levothyroxine (SYNTHROID) 50 MCG tablet TAKE ONE TABLET BY MOUTH BEFORE BREAKFAST 04/30/23   Shelva Majestic, MD  metoprolol tartrate (LOPRESSOR) 25 MG tablet TAKE ONE TABLET BY MOUTH TWICE DAILY 08/26/23   Shelva Majestic, MD  moxifloxacin (VIGAMOX) 0.5 % ophthalmic solution Place into the right eye. 09/28/22   [provider]  NEXLETOL 180 MG TABS TAKE 1 TABLET EVERY DAY 11/15/22   Shelva Majestic, MD  nitroGLYCERIN (NITROSTAT) 0.4 MG SL tablet Place 1 tablet (0.4 mg total) under the tongue every 5 (five) minutes x 3 doses as needed for chest pain. Pt needs to keep upcoming appt in Dec for further refills 07/20/22   Pricilla Riffle, MD  pilocarpine [facility]) 1 % ophthalmic solution Place 1 drop into both eyes 2 (two) times daily.     [provider]  Polyethyl Glycol-Propyl Glycol (SYSTANE OP) Place 1-2 drops into both eyes as needed (for dry eyes).     [provider]  polyethylene glycol powder (GLYCOLAX/MIRALAX) 17 GM/SCOOP powder Take 1 Container by mouth daily.    [provider]  potassium chloride (KLOR-CON) 10 MEQ tablet Take 2 tablets (20 mEq total) by mouth 2 (two) times daily. 06/07/23   Dione Booze, MD  prednisoLONE acetate (PRED FORTE) 1 % ophthalmic suspension Place 1 drop into the right eye 4 (four) times daily. 10/02/22   [provider]  Probiotic Product (PROBIOTIC DAILY) CAPS Take 1 capsule by mouth daily. Reported on 10/10/2015    [provider]  telmisartan (MICARDIS) 40 MG tablet Take 1 tablet (40 mg total) by mouth daily. 07/09/23   Pricilla Riffle, MD  Wheat Dextrin (BENEFIBER DRINK MIX PO) Take by mouth.    [provider]  Wheat Dextrin (BENEFIBER DRINK MIX) PACK Take by mouth.    [provider]      Allergies    Adhesive [tape] and Rosuvastatin    Review of Systems   Review of Systems  Cardiovascular:  Positive for chest pain.    Physical Exam Updated Vital Signs BP (!) 139/54   Pulse 82    Temp 97.7 F (36.5 C) (Oral)   Resp 16   SpO2 100%  Physical Exam Vitals and nursing note reviewed.  Constitutional:      General: She is not in acute distress.    Appearance: She is well-developed. She is not ill-appearing, toxic-appearing or diaphoretic.  HENT:     Head: Normocephalic and atraumatic.     Right Ear: External ear normal.     Left Ear: External ear normal.     Nose: Nose normal.     Mouth/Throat:     Mouth: Mucous membranes are moist.  Eyes:     Extraocular Movements: Extraocular movements intact.     Conjunctiva/sclera: Conjunctivae normal.  Cardiovascular:     Rate and Rhythm: Normal rate and regular rhythm.     Pulses: Normal pulses.     Heart sounds: Normal heart sounds. No murmur heard. Pulmonary:     Effort: Pulmonary effort is normal. No respiratory distress.     Breath sounds: Normal breath sounds.  Abdominal:     Palpations: Abdomen is soft.     Tenderness: There is no abdominal tenderness.  Musculoskeletal:        General: No swelling.     Cervical back: Neck supple.     Right lower leg: No edema.     Left lower leg: No edema.     Comments: No swelling in BLE. No calf tenderness  Skin:    General: Skin is warm and dry.     Capillary Refill: Capillary refill takes less than 2 seconds.  Neurological:     General: No focal deficit present.     Mental Status: She is alert.     Motor: No weakness.     Comments: Equal bilateral grip strength and resisted dorsi/plantarflexion of bilateral feet.  Psychiatric:        Mood and Affect: Mood normal.     ED Results / Procedures / Treatments   Labs (all labs ordered are listed, but only abnormal results are displayed) Labs Reviewed  BASIC METABOLIC PANEL - Abnormal; Notable for the following components:      Result Value   Creatinine, Ser 1.09 (*)    GFR, Estimated 51 (*)    All other components within normal limits  CBC  TROPONIN I (HIGH SENSITIVITY)  TROPONIN I (HIGH SENSITIVITY)    EKG EKG  Interpretation Date/Time:  Monday October 21 2023 13:05:50 EST Ventricular Rate:  70 PR Interval:  162 QRS Duration:  80 QT Interval:  366 QTC Calculation: 395 R Axis:   49  Text Interpretation: Normal sinus rhythm Nonspecific T wave abnormality Abnormal ECG When compared with ECG of 06-Jun-2023 20:56, No significant change was found Confirmed by Vanetta Mulders [phone]) on 10/21/2023 4:10:37 PM  Radiology  DG Chest 2 View Result Date: 10/21/2023 CLINICAL DATA:  Chest pain. EXAM: CHEST - 2 VIEW COMPARISON:  06/06/2023. FINDINGS: Bilateral lung fields are clear. Bilateral costophrenic angles are clear. Normal cardio-mediastinal silhouette. No acute osseous abnormalities. The soft tissues are within normal limits. IMPRESSION: No active cardiopulmonary disease. Electronically Signed   By: Jules Schick M.D.   On: 10/21/2023 15:54    Procedures Procedures    Medications Ordered in ED Medications  alum & mag hydroxide-simeth (MAALOX/MYLANTA) 200-200-20 MG/5ML suspension 30 mL (30 mLs Oral Given 10/21/23 1628)    And  lidocaine (XYLOCAINE) 2 % viscous mouth solution 15 mL (15 mLs Oral Given 10/21/23 1628)    ED Course/ Medical Decision Making/ A&P                                 Medical Decision Making Amount and/or Complexity of Data Reviewed Labs: ordered. Radiology: ordered.  Risk OTC drugs. Prescription drug management.   This patient presents to the ED with chief complaint(s) of chest pain with pertinent past medical history of CAD which further complicates the presenting complaint. The complaint involves an extensive differential diagnosis and also carries with it a high risk of complications and morbidity.    The differential diagnosis includes ACS, costochondritis, atypical chest pain, pericarditis, GERD  Additional history obtained: Records reviewed primary care office notes  ED Course and Reassessment:   Independent labs interpretation:  The following labs were  independently interpreted:  EKG: NSR, nonspecific T wave abnormality CBC: Notable findings BMP: Mildly elevated creatinine at 1.09 Troponin: 3, 2  Independent visualization of imaging: - I independently visualized the following imaging with scope of interpretation limited to determining acute life threatening conditions related to emergency care: Chest x-ray, which revealed no active cardiopulmonary disease  Consultation: - Consulted or discussed management/test interpretation w/ external professional: None  Consideration for admission or further workup: Consider for admission or further workup however patient's vital signs, physical exam, labs, and imaging have been reassuring.  Patient's symptoms likely due to atypical chest pain.  Patient given GI cocktail prior to discharge.  Patient should follow-up with primary care physician in the upcoming days for further evaluation if her symptoms persist for further evaluation and treatment.         Final Clinical Impression(s) / ED Diagnoses Final diagnoses:  Atypical chest pain    Rx / DC Orders ED Discharge Orders     None         Dolphus Jenny, PA-C 10/21/23 1646    Franne Forts, DO 10/26/23 [phone]

## 2023-10-22 ENCOUNTER — Ambulatory Visit: Payer: Medicare PPO | Attending: Cardiovascular Disease | Admitting: Cardiovascular Disease

## 2023-10-22 ENCOUNTER — Encounter: Payer: Self-pay | Admitting: Cardiovascular Disease

## 2023-10-22 VITALS — BP 116/62 | HR 78 | Ht 63.0 in | Wt 142.0 lb

## 2023-10-22 DIAGNOSIS — R0789 Other chest pain: Secondary | ICD-10-CM | POA: Diagnosis not present

## 2023-10-22 DIAGNOSIS — I25119 Atherosclerotic heart disease of native coronary artery with unspecified angina pectoris: Secondary | ICD-10-CM

## 2023-10-22 NOTE — Assessment & Plan Note (Addendum)
History of CAD status post circumflex intervention by Dr. Excell Seltzer 06/01/2019 in the setting of a non-STEMI.  She had a mid AV groove circumflex on a acute band stented with a 2.5 mm x 12 mm long Synergy drug-eluting stent.  She had no other significant CAD.  She was seen in the emergency room yesterday with chest tightness that lasted for several hours.  It appeared to resolve without GI cocktail.  Her enzymes were negative and her EKG showed no acute changes.  She has been pain-free since.  I am going to get a Lexiscan Myoview to further evaluate.

## 2023-10-22 NOTE — Patient Instructions (Signed)
Medication Instructions:  Your physician recommends that you continue on your current medications as directed. Please refer to the Current Medication list given to you today.  *If you need a refill on your cardiac medications before your next appointment, please call your pharmacy*   Testing/Procedures: Your physician has requested that you have a lexiscan myoview. For further information please visit [email or web address] Please follow instruction sheet, as given. This will take place at [address], [address]  How to prepare for your Myocardial Perfusion Test: Do not eat or drink 3 hours prior to your test, except you may have water. Do not consume products containing caffeine (regular or decaffeinated) 12 hours prior to your test. (ex: coffee, chocolate, sodas, tea). Do bring a list of your current medications with you.  If not listed below, you may take your medications as normal. Do wear comfortable clothes (no dresses or overalls) and walking shoes, tennis shoes preferred (No heels or open toe shoes are allowed). Do NOT wear cologne, perfume, aftershave, or lotions (deodorant is allowed). The test will take approximately 3 to 4 hours to complete If these instructions are not followed, your test will have to be rescheduled.    Follow-Up: At [facility], you and your health needs are our priority.  As part of our continuing mission to provide you with exceptional heart care, we have created designated Provider Care Teams.  These Care Teams include your primary Cardiologist (physician) and Advanced Practice Providers (APPs -  Physician Assistants and Nurse Practitioners) who all work together to provide you with the care you need, when you need it.  We recommend signing up for the patient portal called "MyChart".  Sign up information is provided on this After Visit Summary.  MyChart is used to connect with patients for Virtual Visits (Telemedicine).  Patients are able to view  lab/test results, encounter notes, upcoming appointments, etc.  Non-urgent messages can be sent to your provider as well.   To learn more about what you can do with MyChart, go to [email or web address].    Your next appointment:   3 week(s)  Provider:   Dietrich Pates, MD     Other Instructions

## 2023-10-22 NOTE — Progress Notes (Signed)
10/22/2023 [name]   August 18, 1941  [id number]  Primary Physician Durene Cal Aldine Contes, MD Primary Cardiologist: Runell Gess MD Nicholes Calamity, [place]  HPI:  [name] is a 83 y.o. moderately overweight widowed Caucasian female (husband who is an OB/GYN died 6 years ago), mother of 3, grandmother of 4 grandchildren who is a patient of Dr. Charlott Rakes.  She saw Dr. Tenny Craw in the office 10/07/2023 at which time she was completely asymptomatic.  She did have circumflex stenting by Dr. Excell Seltzer 05/12/2019 using a 2.5 mm x 12 mm long Synergy drug-eluting stent on a tortuous mid AV groove circumflex.  She had normal LV function.  She did have a negative Myoview performed 01/26/2021.  Her other problems include history of hypertension and hyperlipidemia.  She was seen in the ER yesterday with atypical chest pain.  She said it felt like "her bras were on too tight".  Her pain resolved with a GI cocktail.  Her enzymes were negative and her EKG showed no acute changes.   Current Meds  Medication Sig   amLODipine (NORVASC) 5 MG tablet Take 2.5 mg by mouth daily.   Ascorbic Acid (VITAMIN C PO) Take by mouth. 1 gummy daily   aspirin EC 81 MG tablet Take 81 mg by mouth daily.   Bimatoprost (LUMIGAN OP) Place 1 drop into both eyes at bedtime.    chlorthalidone (HYGROTON) 25 MG tablet Take 1 tablet (25 mg total) by mouth daily. Pt needs to keep upcoming appt in Dec for further refills   Cholecalciferol (VITAMIN D-3) 5000 UNITS TABS Take 5,000 mg by mouth daily.   dorzolamide-timolol (COSOPT) 22.3-6.8 MG/ML ophthalmic solution Place 1 drop into both eyes 2 (two) times daily.    Evolocumab (REPATHA SURECLICK) 140 MG/ML SOAJ INJECT 140MG  UNDER THE SKIN EVERY 2 WEEKS   famotidine (PEPCID) 20 MG tablet TAKE 1 TABLET BY MOUTH TWICE DAILY.   icosapent Ethyl (VASCEPA) 1 g capsule TAKE 1 CAPSULE TWICE DAILY   levothyroxine (SYNTHROID) 50 MCG tablet TAKE ONE TABLET BY MOUTH BEFORE BREAKFAST   metoprolol  tartrate (LOPRESSOR) 25 MG tablet TAKE ONE TABLET BY MOUTH TWICE DAILY   moxifloxacin (VIGAMOX) 0.5 % ophthalmic solution Place into the right eye.   NEXLETOL 180 MG TABS TAKE 1 TABLET EVERY DAY   nitroGLYCERIN (NITROSTAT) 0.4 MG SL tablet Place 1 tablet (0.4 mg total) under the tongue every 5 (five) minutes x 3 doses as needed for chest pain (up to three times).   pilocarpine (PILOCAR) 1 % ophthalmic solution Place 1 drop into both eyes 2 (two) times daily.    Polyethyl Glycol-Propyl Glycol (SYSTANE OP) Place 1-2 drops into both eyes as needed (for dry eyes).    polyethylene glycol powder (GLYCOLAX/MIRALAX) 17 GM/SCOOP powder Take 1 Container by mouth daily.   potassium chloride (KLOR-CON) 10 MEQ tablet Take 2 tablets (20 mEq total) by mouth 2 (two) times daily.   prednisoLONE acetate (PRED FORTE) 1 % ophthalmic suspension Place 1 drop into the right eye 4 (four) times daily.   Probiotic Product (PROBIOTIC DAILY) CAPS Take 1 capsule by mouth daily. Reported on 10/10/2015   telmisartan (MICARDIS) 40 MG tablet Take 1 tablet (40 mg total) by mouth daily.   Wheat Dextrin (BENEFIBER DRINK MIX PO) Take by mouth.   Wheat Dextrin (BENEFIBER DRINK MIX) PACK Take by mouth.     Allergies  Allergen Reactions   Adhesive [Tape] Other (See Comments)    Causes skin to turn  red where touched   Rosuvastatin Other (See Comments)    Joint & muscle aches, sleep issues.    Social History   Socioeconomic History   Marital status: Widowed    Spouse name: Not on file   Number of children: 1   Years of education: Not on file   Highest education level: Not on file  Occupational History   Occupation: retired  Tobacco Use   Smoking status: Never    Passive exposure: Never   Smokeless tobacco: Never  Vaping Use   Vaping status: Never Used  Substance and Sexual Activity   Alcohol use: Yes    Comment: Rare   Drug use: No   Sexual activity: Not Currently    Birth control/protection: Post-menopausal     Comment: less than 5, Ic after 16, no STD, no abnormal pap, no DES  Other Topics Concern   Not on file  Social History Narrative   Widowed 2019- lives alone. Was  Married 1981. 1 daughter, 2 step sons. 4 grandchildren      Midwife      Hobbies: going out to eat, activities with church including cancer support group at church   Social Drivers of Health   Financial Resource Strain: Low Risk  (08/05/2023)   Overall Financial Resource Strain (CARDIA)    Difficulty of Paying Living Expenses: Not hard at all  Food Insecurity: No Food Insecurity (08/05/2023)   Hunger Vital Sign    Worried About Running Out of Food in the Last Year: Never true    Ran Out of Food in the Last Year: Never true  Transportation Needs: No Transportation Needs (08/05/2023)   PRAPARE - Administrator, Civil Service (Medical): No    Lack of Transportation (Non-Medical): No  Physical Activity: Sufficiently Active (08/05/2023)   Exercise Vital Sign    Days of Exercise per Week: 3 days    Minutes of Exercise per Session: 50 min  Stress: No Stress Concern Present (08/05/2023)   [employer] of Occupational Health - Occupational Stress Questionnaire    Feeling of Stress : Only a little  Social Connections: Moderately Integrated (08/05/2023)   Social Connection and Isolation Panel [NHANES]    Frequency of Communication with Friends and Family: More than three times a week    Frequency of Social Gatherings with Friends and Family: More than three times a week    Attends Religious Services: More than 4 times per year    Active Member of [employer] or Organizations: Yes    Attends Banker Meetings: 1 to 4 times per year    Marital Status: Widowed  Intimate Partner Violence: Not At Risk (08/05/2023)   Humiliation, Afraid, Rape, and Kick questionnaire    Fear of Current or Ex-Partner: No    Emotionally Abused: No    Physically Abused: No    Sexually Abused: No     Review of  Systems: General: negative for chills, fever, night sweats or weight changes.  Cardiovascular: negative for chest pain, dyspnea on exertion, edema, orthopnea, palpitations, paroxysmal nocturnal dyspnea or shortness of breath Dermatological: negative for rash Respiratory: negative for cough or wheezing Urologic: negative for hematuria Abdominal: negative for nausea, vomiting, diarrhea, bright red blood per rectum, melena, or hematemesis Neurologic: negative for visual changes, syncope, or dizziness All other systems reviewed and are otherwise negative except as noted above.    Blood pressure 116/62, pulse 78, height 5\' 3"  (1.6 m), weight 142 lb (64.4 kg), SpO2  100%.  General appearance: alert and no distress Neck: no adenopathy, no carotid bruit, no JVD, supple, symmetrical, trachea midline, and thyroid not enlarged, symmetric, no tenderness/mass/nodules Lungs: clear to auscultation bilaterally Heart: regular rate and rhythm, S1, S2 normal, no murmur, click, rub or gallop Extremities: extremities normal, atraumatic, no cyanosis or edema Pulses: 2+ and symmetric Skin: Skin color, texture, turgor normal. No rashes or lesions Neurologic: Grossly normal  EKG EKG Interpretation Date/Time:  Tuesday October 22 2023 13:38:58 EST Ventricular Rate:  78 PR Interval:  162 QRS Duration:  84 QT Interval:  358 QTC Calculation: 408 R Axis:   54  Text Interpretation: Normal sinus rhythm Normal ECG When compared with ECG of 21-Oct-2023 13:05, No significant change was found Confirmed by Nanetta Batty [phone]) on 10/22/2023 2:06:32 PM    ASSESSMENT AND PLAN:   CAD (coronary artery disease) s/p stent mCx 05/2019 History of CAD status post circumflex intervention by Dr. Excell Seltzer 06/01/2019 in the setting of a non-STEMI.  She had a mid AV groove circumflex on a acute band stented with a 2.5 mm x 12 mm long Synergy drug-eluting stent.  She had no other significant CAD.  She was seen in the emergency room  yesterday with chest tightness that lasted for several hours.  It appeared to resolve without GI cocktail.  Her enzymes were negative and her EKG showed no acute changes.  She has been pain-free since.  I am going to get a Lexiscan Myoview to further evaluate.     Runell Gess MD FACP,FACC,FAHA, [facility] 10/22/2023 2:06 PM

## 2023-10-23 ENCOUNTER — Encounter (HOSPITAL_COMMUNITY): Payer: Self-pay

## 2023-10-24 ENCOUNTER — Encounter: Payer: Self-pay | Admitting: Family Medicine

## 2023-10-24 ENCOUNTER — Ambulatory Visit: Payer: Medicare PPO | Admitting: Family Medicine

## 2023-10-24 VITALS — BP 105/63 | HR 83 | Temp 97.4°F | Ht 63.0 in | Wt 140.0 lb

## 2023-10-24 DIAGNOSIS — I10 Essential (primary) hypertension: Secondary | ICD-10-CM | POA: Diagnosis not present

## 2023-10-24 DIAGNOSIS — I25119 Atherosclerotic heart disease of native coronary artery with unspecified angina pectoris: Secondary | ICD-10-CM

## 2023-10-24 DIAGNOSIS — R0789 Other chest pain: Secondary | ICD-10-CM

## 2023-10-24 NOTE — Progress Notes (Signed)
   [name] is a 83 y.o. female who presents today for an office visit.  Assessment/Plan:  New/Acute Problems: Atypical Chest Pain  No recurrent symptoms since being in the Emergency Department a few days ago. Sounds like it may be GI related however given her cardiac history she does need cardiac evaluation.  She has already seen cardiology for this and has Lexiscan Myoview planned for next week.  We discussed reasons to return to care.  She will let us know if she has any recurrence.  Chronic Problems Addressed Today: CAD Following with cardiology for this.  On aspirin, statin, and Repatha.  She did have an MI a few years ago with stents placed.  We are completing cardiac workup as above.  Hypertension  Blood pressure at goal today on telmisartan 40 mg daily, chlorthalidone 25 mg daily, and metoprolol tartrate 25 mg twice daily.  She is having a few occasional lows at home however is asymptomatic with this.  Will not make any blood pressure changes today however advised her to discuss this with her cardiologist and PCP at their next office visit.  She will let us know if she develops any symptoms in the interim including dizziness or lightheadedness.  GERD On Pepcid 20 mg twice daily.  Based on her symptoms and improvement with GI cocktail it is possible that her above atypical chest pain could have been GI related.  May consider empiric trial of PPI depending on results of above cardiac workup though will defer this to her PCP.    Subjective:  HPI:  Patient here for ED follow-up.  She went to the ED 3 days ago with chest pain.  In the ED had a labs including delta troponin, chest x-ray, EKG, basic metabolic panel, and CBC.  All this was negative.  She was given a GI cocktail and symptoms improved.  She was discharged home.  She saw her cardiologist the next day.  They are planning on getting a Lexiscan Myoview for further evaluation. She has done well the last couple of days.  No  recurrence of chest pain.       Objective:  Physical Exam: BP 105/63   Pulse 83   Temp (!) 97.4 F (36.3 C) (Temporal)   Ht 5\' 3"  (1.6 m)   Wt 140 lb (63.5 kg)   SpO2 96%   BMI 24.80 kg/m   Gen: No acute distress, resting comfortably CV: Regular rate and rhythm with no murmurs appreciated Pulm: Normal work of breathing, clear to auscultation bilaterally with no crackles, wheezes, or rhonchi Neuro: Grossly normal, moves all extremities Psych: Normal affect and thought content  Time Spent: 35 minutes of total time was spent on the date of the encounter performing the following actions: chart review prior to seeing the patient including her recent ED visit and visit with specialists and her PCP, obtaining history, performing a medically necessary exam, counseling on the treatment plan, placing orders, and documenting in our EHR.  Patient's PCP was not available for ED follow-up today and we are covering in his place.        Katina Degree. Jimmey Ralph, MD 10/24/2023 8:38 AM

## 2023-10-24 NOTE — Patient Instructions (Addendum)
It was very nice to see you today!  We will not make any changes today.   Please keep your appointments with cardiology.  Let us know if you have any returning symptoms.  Return if symptoms worsen or fail to improve.   Take care, Dr Jimmey Ralph  PLEASE NOTE:  If you had any lab tests, please let us know if you have not heard back within a few days. You may see your results on mychart before we have a chance to review them but we will give you a call once they are reviewed by [place].   If we ordered any referrals today, please let us know if you have not heard from their office within the next week.   If you had any urgent prescriptions sent in today, please check with the pharmacy within an hour of our visit to make sure the prescription was transmitted appropriately.   Please try these tips to maintain a healthy lifestyle:  Eat at least 3 REAL meals and 1-2 snacks per day.  Aim for no more than 5 hours between eating.  If you eat breakfast, please do so within one hour of getting up.   Each meal should contain half fruits/vegetables, one quarter protein, and one quarter carbs (no bigger than a computer mouse)  Cut down on sweet beverages. This includes juice, soda, and sweet tea.   Drink at least 1 glass of water with each meal and aim for at least 8 glasses per day  Exercise at least 150 minutes every week.

## 2023-10-31 ENCOUNTER — Ambulatory Visit (HOSPITAL_COMMUNITY): Payer: Medicare PPO

## 2023-11-01 ENCOUNTER — Telehealth (HOSPITAL_COMMUNITY): Payer: Self-pay | Admitting: *Deleted

## 2023-11-01 NOTE — Telephone Encounter (Signed)
Patient given detailed instructions per Myocardial Perfusion Study Information Sheet for the test on 11/05/23 at 1:15. Patient notified to arrive 15 minutes early and that it is imperative to arrive on time for appointment to keep from having the test rescheduled.  If you need to cancel or reschedule your appointment, please call the office within 24 hours of your appointment. . Patient verbalized understanding.[name]

## 2023-11-05 ENCOUNTER — Ambulatory Visit (HOSPITAL_COMMUNITY): Payer: Medicare PPO | Attending: Cardiovascular Disease

## 2023-11-05 DIAGNOSIS — I25119 Atherosclerotic heart disease of native coronary artery with unspecified angina pectoris: Secondary | ICD-10-CM | POA: Diagnosis not present

## 2023-11-05 DIAGNOSIS — R0789 Other chest pain: Secondary | ICD-10-CM | POA: Diagnosis not present

## 2023-11-05 LAB — MYOCARDIAL PERFUSION IMAGING
LV dias vol: 37 mL (ref 46–106)
LV sys vol: 7 mL
Nuc Stress EF: 81 %
Peak HR: 90 {beats}/min
Rest HR: 59 {beats}/min
Rest Nuclear Isotope Dose: 10.6 mCi
SDS: 4
SRS: 2
SSS: 6
ST Depression (mm): 0 mm
Stress Nuclear Isotope Dose: 31.2 mCi
TID: 0.98

## 2023-11-05 MED ORDER — TECHNETIUM TC 99M TETROFOSMIN IV KIT: 31.2000 | PACK | Freq: Once | INTRAVENOUS | Status: DC | PRN

## 2023-11-05 MED ORDER — REGADENOSON 0.4 MG/5ML IV SOLN: 0.4000 mg | Freq: Once | INTRAVENOUS | Status: DC

## 2023-11-05 MED ORDER — TECHNETIUM TC 99M TETROFOSMIN IV KIT
10.6000 | PACK | Freq: Once | INTRAVENOUS | Status: AC | PRN
  Administered 2023-11-05: 10.6 via INTRAVENOUS

## 2023-11-07 ENCOUNTER — Other Ambulatory Visit: Payer: Self-pay | Admitting: Family Medicine

## 2023-11-11 ENCOUNTER — Encounter: Payer: Self-pay | Admitting: Nurse Practitioner

## 2023-11-21 ENCOUNTER — Ambulatory Visit: Payer: Medicare PPO | Admitting: Nurse Practitioner

## 2023-11-23 ENCOUNTER — Other Ambulatory Visit: Payer: Self-pay | Admitting: Family Medicine

## 2023-12-04 ENCOUNTER — Encounter: Payer: Self-pay | Admitting: Family Medicine

## 2023-12-04 ENCOUNTER — Ambulatory Visit: Payer: Medicare PPO | Admitting: Family Medicine

## 2023-12-04 VITALS — BP 114/66 | HR 56 | Temp 97.8°F | Ht 63.0 in | Wt 141.2 lb

## 2023-12-04 DIAGNOSIS — I10 Essential (primary) hypertension: Secondary | ICD-10-CM

## 2023-12-04 DIAGNOSIS — E782 Mixed hyperlipidemia: Secondary | ICD-10-CM | POA: Diagnosis not present

## 2023-12-04 DIAGNOSIS — G720 Drug-induced myopathy: Secondary | ICD-10-CM | POA: Diagnosis not present

## 2023-12-04 DIAGNOSIS — E039 Hypothyroidism, unspecified: Secondary | ICD-10-CM | POA: Diagnosis not present

## 2023-12-04 DIAGNOSIS — I25119 Atherosclerotic heart disease of native coronary artery with unspecified angina pectoris: Secondary | ICD-10-CM | POA: Diagnosis not present

## 2023-12-04 NOTE — Patient Instructions (Addendum)
 Since you had labs in ER we will hold off and do labs next visit  Thrilled you are doing better with anxiety and grieving- we opted to hold off on medicine  Keep getting out of the house- glad this has been so helpful!   Recommended follow up: Return in about 2 months (around 02/03/2024) for followup or sooner if needed.Schedule b4 you leave.

## 2023-12-04 NOTE — Progress Notes (Signed)
 Phone [phone] In person visit   Subjective:   [name] is a 83 y.o. year old very pleasant female patient who presents for/with See problem oriented charting Chief Complaint  Patient presents with   Follow-up    Pt here for follow up on anxiety without any concerns    Hypertension   Hyperlipidemia    Past Medical History-  Patient Active Problem List   Diagnosis Date Noted   CAD (coronary artery disease) s/p stent mCx 05/2019 06/02/2019    Priority: High   Grieving 02/18/2017    Priority: High   Osteoporosis 11/05/2016    Priority: High   Anxiety 11/27/2022    Priority: Medium    Aortic atherosclerosis (HCC) 09/15/2020    Priority: Medium    Chest pain 12/29/2018    Priority: Medium    Primary open-angle glaucoma 11/20/2017    Priority: Medium    Hypothyroidism 07/01/2013    Priority: Medium    Hypertension 11/02/2012    Priority: Medium    Hyperlipidemia 07/24/2011    Priority: Medium    hx: breast cancer, IDC (Microinvasive) w DCIS, 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     Ascorbic Acid (VITAMIN C PO) Take by mouth. 1 gummy daily     aspirin EC 81 MG tablet Take 81 mg by mouth daily.     Bimatoprost (LUMIGAN OP) Place 1 drop into both eyes at bedtime.      chlorthalidone (HYGROTON) 25 MG tablet Take 1 tablet (25 mg total) by mouth daily. Pt needs to keep upcoming appt in Dec for further refills 90 tablet 1   Cholecalciferol (VITAMIN D-3) 5000 UNITS TABS Take 5,000 mg by mouth daily.      dorzolamide-timolol (COSOPT) 22.3-6.8 MG/ML ophthalmic solution Place 1 drop into both eyes 2 (two) times daily.      Evolocumab (REPATHA SURECLICK) 140 MG/ML SOAJ INJECT 140MG  UNDER THE SKIN EVERY 2 WEEKS 6 mL 3   famotidine (PEPCID) 20 MG tablet TAKE 1 TABLET BY MOUTH TWICE DAILY. 180 tablet 3   icosapent Ethyl (VASCEPA) 1 g capsule TAKE 1 CAPSULE TWICE DAILY 180 capsule 1   levothyroxine (SYNTHROID) 50 MCG tablet TAKE ONE TABLET BY MOUTH BEFORE BREAKFAST 90 tablet 3   metoprolol tartrate (LOPRESSOR) 25 MG tablet TAKE ONE TABLET BY MOUTH TWICE DAILY 180 tablet 0   moxifloxacin (VIGAMOX) 0.5 % ophthalmic solution Place into the right eye.     NEXLETOL 180 MG TABS TAKE 1 TABLET EVERY DAY 90 tablet 3   nitroGLYCERIN (NITROSTAT) 0.4 MG SL tablet Place 1 tablet (0.4 mg total) under the tongue every 5 (five) minutes x 3 doses as needed for chest pain (up to three times). 30 tablet 0   pilocarpine (PILOCAR) 1 % ophthalmic solution Place 1 drop into both eyes 2 (two) times daily.      Polyethyl Glycol-Propyl Glycol (SYSTANE OP) Place 1-2 drops into both eyes as needed (for dry eyes).      polyethylene  glycol powder (GLYCOLAX/MIRALAX) 17 GM/SCOOP powder Take 1 Container by mouth daily.     potassium chloride (KLOR-CON) 10 MEQ tablet Take 2 tablets (20 mEq total) by mouth 2 (two) times daily. 240 tablet 3   prednisoLONE acetate (PRED FORTE) 1 % ophthalmic suspension Place 1 drop into the right eye 4 (four) times daily.     Probiotic Product (PROBIOTIC DAILY) CAPS Take 1 capsule by mouth daily. Reported on 10/10/2015     telmisartan (MICARDIS) 40 MG tablet Take 1 tablet (40 mg total) by mouth daily. 90 tablet 3   Wheat Dextrin (BENEFIBER DRINK MIX PO) Take by mouth.     Wheat Dextrin (BENEFIBER DRINK MIX) PACK Take by mouth.     No current facility-administered medications for this visit.   Facility-Administered Medications Ordered in Other Visits  Medication Dose Route Frequency Provider Last Rate Last  Admin   regadenoson (LEXISCAN) injection SOLN 0.4 mg  0.4 mg Intravenous Once Hilty, Lisette Abu, MD       technetium tetrofosmin (TC-MYOVIEW) injection 31.2 millicurie  31.2 millicurie Intravenous Once PRN Hilty, Lisette Abu, MD         Objective:  BP 114/66   Pulse (!) 56   Temp 97.8 F (36.6 C)   Ht 5\' 3"  (1.6 m)   Wt 141 lb 3.2 oz (64 kg)   SpO2 99%   BMI 25.01 kg/m  Gen: NAD, resting comfortably CV: RRR no murmurs rubs or gallops Lungs: CTAB no crackles, wheeze, rhonchi Ext: on amlodipine- trace edema Skin: warm, dry     Assessment and Plan   # Prolonged grieving/depression/anxiety-extremely close to her now deceased spouse and challenging transition since his passing February 2019 - family and patient feel she is doing better. Getting out of house more and doing a lot at church. Bereavement committee and making it through -wants to hold off  on medicine as feels she is doing better - later insurance covered visit with Dr. Monna Fam- may go back if things worsen  #CAD-follows with Dr. Tenny Craw on aspirin #hyperlipidemia- statin myalgia S: Medication: Repatha 140 mg every 2 weeks (occasional miss), nexletol/bempedoic acid 180mg  daily and Vascepa 1 g  (prior clear study- stopped mid 2022), aspirin 81 mg  -Did have atypical chest pain and saw Dr. Gery Pray on 10/22/2023 and Dr. Jimmey Ralph on 10/24/2023 after reassuring ED visit on 10/21/2023 with flat troponin trend.  Thankfully reassuring stress test on 11/05/2023 reviewed by Dr. Allyson Sabal as low risk -no further chest pain or shortness of breath  Lab Results  Component Value Date   CHOL 130 05/31/2023   HDL 65.30 05/31/2023   LDLCALC 48 05/31/2023   LDLDIRECT 54.0 06/01/2021   TRIG 84.0 05/31/2023   CHOLHDL 2 05/31/2023   A/P: coronary artery disease asymptomatic continue current medications . Reassuring stress test Lipids at goal- continue current medications   #hypertension S: medication: Metoprolol 25 mg twice daily, amlodipine 2.5 mg  (with 2nd dose if needed for high BP over 150 given variability), telmisartan 40 mg per Dr. Tenny Craw, chlorthalidone 25 mg Home readings #s: high deductible savings account stopped checking  BP Readings from Last 3 Encounters:  12/04/23 114/66  10/24/23 105/63  10/22/23 116/62  A/P: well controlled continue current medications   #hypothyroidism S: compliant On thyroid medication-levothyroxine 50 mcg Lab Results  Component Value Date   TSH 1.72 08/01/2023   A/P:stable- continue current medicines . Consider recheck next visit   #HM- declines DEXA with prior dental work issues- gynecology had suggeseted  Recommended follow up: Return in about 2 months (around 02/03/2024) for followup or sooner if needed.Schedule b4 you leave. Future Appointments  Date Time Provider Department Center  12/09/2023 10:40 AM Pricilla Riffle, MD [facility]  08/17/2024  8:00 AM [facility] ANNUAL WELLNESS VISIT 1 [facility]   Lab/Order associations:   ICD-10-CM   1. Coronary artery disease involving native coronary artery of native heart with angina pectoris (HCC)  I25.119     2. Mixed hyperlipidemia  E78.2     3. Primary hypertension  I10     4. Hypothyroidism, unspecified type  E03.9       No orders of the defined types were placed in this encounter.   Return precautions advised.  Tana Conch, MD

## 2023-12-08 NOTE — Progress Notes (Unsigned)
 Cardiology Office Note   Date:  12/09/2023   ID:  Alexandra, Fowler 1940/09/29, MRN [id number]  PCP:  Shelva Majestic, MD  Cardiologist:   Dietrich Pates, MD    Patient presents for f/u of CAD    History of Present Illness: [name] is a 83 y.o. female with a history of HTN, HL and  CAD   April 2020 admitted for CP.  CT chest/abd showed extensive atherosclerosis. CCTA showed no obstructive lesons    September 2020.  Patient admitted with an NSTEMI.  LHC showed severe single-vessel disease with a critical stenosis of the left circumflex.  She underwent PTCA/DES to this area.  There was nonobstructive disease noted in the LAD.  Plan for dual antiplatelet therapy for 12 month  April 2022 chest pressure   Felt noncardiac   [name] was normal    I saw the pt in Jan 2025.  She was doing okay at the time.  On October 21, 2023, she presented to the emergency room with chest pain.  She said she felt a "brawl like" sensation across her chest.  Her blood pressure was very high and she admits that she became obsessed by it and scared.  She was seen by York Ram on 10/22/2023.  Scheduled for [employer].  This was done November 05, 2023.  It was normal.  Since seen the patient has done okay.  She says she has stopped taking her blood pressure at home.  It makes her very anxious.  When she was seen by Dr. Durene Cal her blood pressure was well-controlled.  The patient admits that she is still mourning the loss of her husband several years ago.  Current Meds  Medication Sig   Ascorbic Acid (VITAMIN C PO) Take by mouth. 1 gummy daily   aspirin EC 81 MG tablet Take 81 mg by mouth daily.   Bimatoprost (LUMIGAN OP) Place 1 drop into both eyes at bedtime.    chlorthalidone (HYGROTON) 25 MG tablet Take 1 tablet (25 mg total) by mouth daily. Pt needs to keep upcoming appt in Dec for further refills   Cholecalciferol (VITAMIN D-3) 5000 UNITS TABS Take 5,000 mg by mouth daily.    dorzolamide-timolol (COSOPT) 22.3-6.8 MG/ML ophthalmic solution Place 1 drop into both eyes 2 (two) times daily.    Evolocumab (REPATHA SURECLICK) 140 MG/ML SOAJ INJECT 140MG  UNDER THE SKIN EVERY 2 WEEKS   famotidine (PEPCID) 20 MG tablet TAKE 1 TABLET BY MOUTH TWICE DAILY.   icosapent Ethyl (VASCEPA) 1 g capsule TAKE 1 CAPSULE TWICE DAILY   levothyroxine (SYNTHROID) 50 MCG tablet TAKE ONE TABLET BY MOUTH BEFORE BREAKFAST   metoprolol tartrate (LOPRESSOR) 25 MG tablet TAKE ONE TABLET BY MOUTH TWICE DAILY   moxifloxacin (VIGAMOX) 0.5 % ophthalmic solution Place into the right eye.   NEXLETOL 180 MG TABS TAKE 1 TABLET EVERY DAY   nitroGLYCERIN (NITROSTAT) 0.4 MG SL tablet Place 1 tablet (0.4 mg total) under the tongue every 5 (five) minutes x 3 doses as needed for chest pain (up to three times).   pilocarpine (PILOCAR) 1 % ophthalmic solution Place 1 drop into both eyes 2 (two) times daily.    Polyethyl Glycol-Propyl Glycol (SYSTANE OP) Place 1-2 drops into both eyes as needed (for dry eyes).    polyethylene glycol powder (GLYCOLAX/MIRALAX) 17 GM/SCOOP powder Take 1 Container by mouth daily.   potassium chloride (KLOR-CON) 10 MEQ tablet Take 2 tablets (20 mEq total) by mouth 2 (two)  times daily.   prednisoLONE acetate (PRED FORTE) 1 % ophthalmic suspension Place 1 drop into the right eye 4 (four) times daily.   Probiotic Product (PROBIOTIC DAILY) CAPS Take 1 capsule by mouth daily. Reported on 10/10/2015   Wheat Dextrin (BENEFIBER DRINK MIX PO) Take by mouth.   Wheat Dextrin (BENEFIBER DRINK MIX) PACK Take by mouth.   [DISCONTINUED] amLODipine (NORVASC) 5 MG tablet Take 2.5 mg by mouth daily.   [DISCONTINUED] telmisartan (MICARDIS) 40 MG tablet Take 1 tablet (40 mg total) by mouth daily.     Allergies:   Adhesive [tape] and Rosuvastatin   Past Medical History:  Diagnosis Date   Abnormal thyroid function test 07/01/2013   Anxiety    Arthritis    Cancer (HCC)    Coronary artery disease     Dermatitis 11/25/2013   Glaucoma 11/02/2012   HTN (hypertension) 11/02/2012   hx: breast cancer, IDC (Microinvasive) w DCIS, receptor + her 2 - 07/10/2011   Hyperlipidemia    Hypothyroid    Obesity     Past Surgical History:  Procedure Laterality Date   AUGMENTATION MAMMAPLASTY Left    05/2006   BREAST SURGERY  10/2005   Left Mastectomy, reconstruction, reduction on right. Dr. Odis Luster   CARDIAC CATHETERIZATION     COLONOSCOPY     CORONARY STENT INTERVENTION N/A 06/01/2019   Procedure: CORONARY STENT INTERVENTION;  Surgeon: Tonny Bollman, MD;  Location: [facility] INVASIVE CV LAB;  Service: Cardiovascular;  Laterality: N/A;   CRYOTHERAPY     GYN for CIN III   DILATION AND CURETTAGE OF UTERUS     EXCISION MORTON'S NEUROMA     right foot   EYE SURGERY Right 05/08/2018   LEFT HEART CATH AND CORONARY ANGIOGRAPHY N/A 06/01/2019   Procedure: LEFT HEART CATH AND CORONARY ANGIOGRAPHY;  Surgeon: Tonny Bollman, MD;  Location: [facility] INVASIVE CV LAB;  Service: Cardiovascular;  Laterality: N/A;   MASTECTOMY     REDUCTION MAMMAPLASTY Right      Social History:  The patient  reports that she has never smoked. She has never been exposed to tobacco smoke. She has never used smokeless tobacco. She reports current alcohol use. She reports that she does not use drugs.   Family History:  The patient's family history includes CAD in her mother; Colon cancer in her paternal grandmother; Hyperlipidemia in her mother and sister; Other in her father; Parkinsonism in her mother; Pneumonia in her mother.    ROS:  Please see the history of present illness. All other systems are reviewed and  Negative to the above problem except as noted.    PHYSICAL EXAM: VS:  BP 132/60   Pulse 66   Ht 5\' 3"  (1.6 m)   Wt 64 kg   SpO2 96%   BMI 24.98 kg/m   GEN: Pt is a 83 yo female in no acute distress  HEENT: normal  Neck: no JVD no bruits Cardiac: RRR; no murmurs  Respiratory: Clear to auscultation Ext  no LE edema     EKG:  EKG is not ordered today.     Lipid Panel    Component Value Date/Time   CHOL 130 05/31/2023 1111   CHOL 131 10/27/2020 0936   TRIG 84.0 05/31/2023 1111   HDL 65.30 05/31/2023 1111   HDL 64 10/27/2020 0936   CHOLHDL 2 05/31/2023 1111   VLDL 16.8 05/31/2023 1111   LDLCALC 48 05/31/2023 1111   LDLCALC 53 10/27/2020 0936   LDLCALC 30 05/12/2020 1136  LDLDIRECT 54.0 06/01/2021 0829      Wt Readings from Last 3 Encounters:  12/09/23 64 kg  12/04/23 64 kg  11/05/23 64.4 kg    Cardiac Studies  CORONARY STENT INTERVENTION 05/2019  LEFT HEART CATH AND CORONARY ANGIOGRAPHY  Conclusion   1) Severe single vessel CAD with critical stenosis of the mid-circumflex, treated successfully with PTCA and stenting (2.5x12 mm Synergy DES 2) nonobstructive LAD stenosis 3) widely patent RCA, dominant vessel 4) normal LV systolic function   Recommend: DAPT with ASA and ticagrelor x 12 months without interruption. Pt loaded with ticagrelor 180 mg on the cath lab table. DC home tomorrow am if no complications arise.      ASSESSMENT AND PLAN:  1  CAD  s/p PTCA/DES in 2020.  Recent episode of chest discomfort.  Myoview is without ischemia in February 2025.  She remains asymptomatic now.  2  HTN blood pressure has been high recently.  She admits that she gets anxious when she checks it at.  The last couple checks have been good today 132/60.  Follow.  3  HL  LDL 48  HDL 65  Trig 84   Keep on current regimen   4.  PAD atherosclerosis of aorta   Follow up in Fall 2025  Current medicines are reviewed at length with the patient today.  The patient does not have concerns regarding medicines.  Signed, Dietrich Pates, MD  12/09/2023 10:28 PM    [facility] [address] [place], [place], [place]  [address] Phone: [phone]; Fax: [phone]

## 2023-12-09 ENCOUNTER — Encounter: Payer: Self-pay | Admitting: Internal Medicine

## 2023-12-09 ENCOUNTER — Ambulatory Visit: Payer: Medicare PPO | Attending: Internal Medicine | Admitting: Internal Medicine

## 2023-12-09 VITALS — BP 132/60 | HR 66 | Ht 63.0 in | Wt 141.0 lb

## 2023-12-09 DIAGNOSIS — I2510 Atherosclerotic heart disease of native coronary artery without angina pectoris: Secondary | ICD-10-CM | POA: Diagnosis not present

## 2023-12-09 MED ORDER — TELMISARTAN 40 MG PO TABS: 40.0000 mg | ORAL_TABLET | Freq: Every day | ORAL | 3 refills | Status: AC

## 2023-12-09 MED ORDER — AMLODIPINE BESYLATE 5 MG PO TABS: 2.5000 mg | ORAL_TABLET | Freq: Every day | ORAL | 2 refills | Status: DC

## 2023-12-09 NOTE — Patient Instructions (Signed)
 Medication Instructions:   Your physician recommends that you continue on your current medications as directed. Please refer to the Current Medication list given to you today.  *If you need a refill on your cardiac medications before your next appointment, please call your pharmacy*   Follow-Up: At [facility], you and your health needs are our priority.  As part of our continuing mission to provide you with exceptional heart care, our providers are all part of one team.  This team includes your primary Cardiologist (physician) and Advanced Practice Providers or APPs (Physician Assistants and Nurse Practitioners) who all work together to provide you with the care you need, when you need it.  Your next appointment:   6 month(s)  Provider:   Dietrich Pates, MD          1st Floor: - Lobby - Registration  - Pharmacy  - Lab - Cafe  2nd Floor: - PV Lab - Diagnostic Testing (echo, CT, nuclear med)  3rd Floor: - Vacant  4th Floor: - TCTS (cardiothoracic surgery) - AFib Clinic - Structural Heart Clinic - Vascular Surgery  - Vascular Ultrasound  5th Floor: - HeartCare Cardiology (general and EP) - Clinical Pharmacy for coumadin, hypertension, lipid, weight-loss medications, and med management appointments    Valet parking services will be available as well.

## 2024-02-04 ENCOUNTER — Other Ambulatory Visit: Payer: Self-pay | Admitting: Internal Medicine

## 2024-02-12 ENCOUNTER — Encounter: Payer: Self-pay | Admitting: Family Medicine

## 2024-02-12 ENCOUNTER — Ambulatory Visit: Admitting: Family Medicine

## 2024-02-12 VITALS — BP 122/64 | HR 71 | Temp 97.3°F | Ht 63.0 in | Wt 137.6 lb

## 2024-02-12 DIAGNOSIS — I10 Essential (primary) hypertension: Secondary | ICD-10-CM | POA: Diagnosis not present

## 2024-02-12 DIAGNOSIS — E039 Hypothyroidism, unspecified: Secondary | ICD-10-CM | POA: Diagnosis not present

## 2024-02-12 DIAGNOSIS — R739 Hyperglycemia, unspecified: Secondary | ICD-10-CM | POA: Diagnosis not present

## 2024-02-12 DIAGNOSIS — I25119 Atherosclerotic heart disease of native coronary artery with unspecified angina pectoris: Secondary | ICD-10-CM | POA: Diagnosis not present

## 2024-02-12 DIAGNOSIS — Z131 Encounter for screening for diabetes mellitus: Secondary | ICD-10-CM | POA: Diagnosis not present

## 2024-02-12 DIAGNOSIS — E782 Mixed hyperlipidemia: Secondary | ICD-10-CM

## 2024-02-12 LAB — CBC WITH DIFFERENTIAL/PLATELET
Basophils Absolute: 0.1 10*3/uL (ref 0.0–0.1)
Basophils Relative: 2.3 % (ref 0.0–3.0)
Eosinophils Absolute: 0.2 10*3/uL (ref 0.0–0.7)
Eosinophils Relative: 3.7 % (ref 0.0–5.0)
HCT: 35.6 % — ABNORMAL LOW (ref 36.0–46.0)
Hemoglobin: 12 g/dL (ref 12.0–15.0)
Lymphocytes Relative: 23.2 % (ref 12.0–46.0)
Lymphs Abs: 1.4 10*3/uL (ref 0.7–4.0)
MCHC: 33.6 g/dL (ref 30.0–36.0)
MCV: 89.9 fl (ref 78.0–100.0)
Monocytes Absolute: 0.6 10*3/uL (ref 0.1–1.0)
Monocytes Relative: 9 % (ref 3.0–12.0)
Neutro Abs: 3.8 10*3/uL (ref 1.4–7.7)
Neutrophils Relative %: 61.8 % (ref 43.0–77.0)
Platelets: 314 10*3/uL (ref 150.0–400.0)
RBC: 3.96 Mil/uL (ref 3.87–5.11)
RDW: 12.7 % (ref 11.5–15.5)
WBC: 6.2 10*3/uL (ref 4.0–10.5)

## 2024-02-12 LAB — LIPID PANEL
Cholesterol: 206 mg/dL — ABNORMAL HIGH (ref 0–200)
HDL: 52.1 mg/dL (ref >39.00)
LDL Cholesterol: 136 mg/dL — ABNORMAL HIGH (ref 0–99)
NonHDL: 154.15
Total CHOL/HDL Ratio: 4
Triglycerides: 92 mg/dL (ref 0.0–149.0)
VLDL: 18.4 mg/dL (ref 0.0–40.0)

## 2024-02-12 LAB — COMPREHENSIVE METABOLIC PANEL WITH GFR
ALT: 11 U/L (ref 0–35)
AST: 15 U/L (ref 0–37)
Albumin: 4.6 g/dL (ref 3.5–5.2)
Alkaline Phosphatase: 44 U/L (ref 39–117)
BUN: 29 mg/dL — ABNORMAL HIGH (ref 6–23)
CO2: 27 meq/L (ref 19–32)
Calcium: 9.6 mg/dL (ref 8.4–10.5)
Chloride: 104 meq/L (ref 96–112)
Creatinine, Ser: 1.39 mg/dL — ABNORMAL HIGH (ref 0.40–1.20)
GFR: 35.34 mL/min — ABNORMAL LOW (ref >60.00)
Glucose, Bld: 89 mg/dL (ref 70–99)
Potassium: 4.3 meq/L (ref 3.5–5.1)
Sodium: 140 meq/L (ref 135–145)
Total Bilirubin: 0.7 mg/dL (ref 0.2–1.2)
Total Protein: 6.6 g/dL (ref 6.0–8.3)

## 2024-02-12 LAB — HEMOGLOBIN A1C: Hgb A1c MFr Bld: 5.7 % (ref 4.6–6.5)

## 2024-02-12 LAB — TSH: TSH: 1.63 u[IU]/mL (ref 0.35–5.50)

## 2024-02-12 NOTE — Progress Notes (Signed)
 Phone [phone] In person visit   Subjective:   [name] is a 83 y.o. year old very pleasant female patient who presents for/with See problem oriented charting Chief Complaint  Patient presents with   Medical Management of Chronic Issues   Hypertension         Past Medical History-  Patient Active Problem List   Diagnosis Date Noted   CAD (coronary artery disease) s/p stent mCx 05/2019 06/02/2019    Priority: High   Grieving 02/18/2017    Priority: High   Osteoporosis 11/05/2016    Priority: High   Anxiety 11/27/2022    Priority: Medium    Aortic atherosclerosis (HCC) 09/15/2020    Priority: Medium    Chest pain 12/29/2018    Priority: Medium    Primary open-angle glaucoma 11/20/2017    Priority: Medium    Hypothyroidism 07/01/2013    Priority: Medium    Hypertension 11/02/2012    Priority: Medium    Hyperlipidemia 07/24/2011    Priority: Medium    hx: breast cancer, IDC (Microinvasive) w DCIS, 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 45 tablet 2   Ascorbic Acid (VITAMIN C PO) Take by mouth. 1 gummy daily     aspirin  EC 81 MG tablet Take 81 mg by mouth daily.     Bimatoprost  (LUMIGAN  OP) Place 1 drop into both eyes at bedtime.      chlorthalidone  (HYGROTON ) 25 MG tablet Take 1 tablet (25 mg total) by mouth daily. Pt needs to keep upcoming appt in Dec for further refills 90 tablet 1   Cholecalciferol  (VITAMIN D -3) 5000 UNITS TABS Take 5,000 mg by mouth daily.     dorzolamide -timolol  (COSOPT )  22.3-6.8 MG/ML ophthalmic solution Place 1 drop into both eyes 2 (two) times daily.      Evolocumab  (REPATHA  SURECLICK) 140 MG/ML SOAJ INJECT 140MG  UNDER THE SKIN EVERY 2 WEEKS 6 mL 3   famotidine  (PEPCID ) 20 MG tablet TAKE 1 TABLET BY MOUTH TWICE DAILY. 180 tablet 3   levothyroxine  (SYNTHROID ) 50 MCG tablet TAKE ONE TABLET BY MOUTH BEFORE BREAKFAST 90 tablet 3   metoprolol  tartrate (LOPRESSOR ) 25 MG tablet TAKE ONE TABLET BY MOUTH TWICE DAILY 180 tablet 0   moxifloxacin (VIGAMOX) 0.5 % ophthalmic solution Place into the right eye.     NEXLETOL  180 MG TABS TAKE 1 TABLET EVERY DAY 90 tablet 3   nitroGLYCERIN  (NITROSTAT ) 0.4 MG SL tablet Place 1 tablet (0.4 mg total) under the tongue every 5 (five) minutes x 3 doses as needed for chest pain (up to three times). 30 tablet 0   pilocarpine  (PILOCAR) 1 % ophthalmic solution Place 1 drop into both eyes 2 (two) times daily.      Polyethyl Glycol-Propyl Glycol (SYSTANE OP) Place 1-2 drops into both eyes as needed (for dry eyes).      polyethylene glycol powder (GLYCOLAX/MIRALAX) 17 GM/SCOOP powder Take 1 Container by mouth daily.     potassium chloride  (KLOR-CON ) 10  MEQ tablet Take 2 tablets (20 mEq total) by mouth 2 (two) times daily. 240 tablet 3   prednisoLONE  acetate (PRED FORTE ) 1 % ophthalmic suspension Place 1 drop into the right eye 4 (four) times daily.     Probiotic Product (PROBIOTIC DAILY) CAPS Take 1 capsule by mouth daily. Reported on 10/10/2015     telmisartan  (MICARDIS ) 40 MG tablet Take 1 tablet (40 mg total) by mouth daily. 90 tablet 3   VASCEPA  1 g capsule TAKE 1 CAPSULE TWICE DAILY 180 capsule 3   Wheat Dextrin (BENEFIBER DRINK MIX PO) Take by mouth.     Wheat Dextrin (BENEFIBER DRINK MIX) PACK Take by mouth.     No current facility-administered medications for this visit.     Objective:  BP 122/64   Pulse 71   Temp (!) 97.3 F (36.3 C)   Ht 5\' 3"  (1.6 m)   Wt 137 lb 9.6 oz (62.4 kg)   SpO2 94%   BMI 24.37 kg/m  Gen: NAD,  resting comfortably CV: RRR no murmurs rubs or gallops Lungs: CTAB no crackles, wheeze, rhonchi Ext: trace edema Skin: warm, dry     Assessment and Plan   #social update- got offer from 2 bedroom at [facility]  #spot on chest likely seborrheic keratosis but one portion of this is rather darker- she plans to get dermatology to check this and I agree  #some memory changes- not using her hearing aids consistently- advised  # Prolonged grieving/depression/anxiety-extremely close to her now deceased spouse and challenging transition since his passing February 2019- has been doing rather well lately thankfully- good community support  #CAD-follows with Dr. Avanell Bob on aspirin  #PAD listed by Dr. Rexine Cater does have aortic atherosclerosis #hyperlipidemia- statin myalgia S: Medication: Repatha  140 mg every 2 weeks, nexletol /bempedoic acid  180mg  daily and Vascepa  1 g  (prior clear study- stopped mid 2022), aspirin  81 mg -no chest pain or shortness of breath  Lab Results  Component Value Date   CHOL 130 05/31/2023   HDL 65.30 05/31/2023   LDLCALC 48 05/31/2023   LDLDIRECT 54.0 06/01/2021   TRIG 84.0 05/31/2023   CHOLHDL 2 05/31/2023   A/P: coronary artery disease asymptomatic continue current medications  Aortic atherosclerosis (presumed stable)- LDL goal ideally <70 - at goal- lipids at goal -down 4 lbs thankfuly  #hypertension S: medication: Metoprolol  25 mg twice daily, amlodipine  2.5 mg (with 2nd dose if needed for high BP over 150 given variability), telmisartan  40 mg per Dr. Avanell Bob, chlorthalidone  25 mg BP Readings from Last 3 Encounters:  02/12/24 122/64  12/09/23 132/60  12/04/23 114/66  A/P: well controlled continue current medications  -working out at [employer] well  #hypothyroidism S: compliant On thyroid  medication-levothyroxine  50 mcg  A/P:hopefully stable- update tsh today. Continue current meds for now    #Vitamin D  deficiency S: Medication: 5000 units daily  Last  vitamin D  Lab Results  Component Value Date   VD25OH 43.77 08/01/2023  A/P: levels lookd good last year - check at least yearly ideally   Recommended follow up: Return in about 2 months (around 04/13/2024) for followup or sooner if needed.Schedule b4 you leave. Future Appointments  Date Time Provider Department Center  05/15/2024  1:40 PM Elmyra Haggard, MD [facility]  08/17/2024  8:00 AM [facility] ANNUAL WELLNESS VISIT 1 [facility]    Lab/Order associations:   ICD-10-CM   1. Coronary artery disease involving native coronary artery of native heart with angina pectoris (HCC)  I25.119  2. Primary hypertension  I10     3. Mixed hyperlipidemia  E78.2 Comprehensive metabolic panel with GFR    CBC with Differential/Platelet    Lipid panel    4. Hypothyroidism, unspecified type  E03.9 TSH    5. Screening for diabetes mellitus  Z13.1 Hemoglobin A1c    6. Hyperglycemia  R73.9 Hemoglobin A1c      No orders of the defined types were placed in this encounter.   Return precautions advised.  Clarisa Crooked, MD

## 2024-02-12 NOTE — Patient Instructions (Addendum)
 Please stop by lab before you go If you have mychart- we will send your results within 3 business days of us  receiving them.  If you do not have mychart- we will call you about results within 5 business days of us  receiving them.  *please also note that you will see labs on mychart as soon as they post. I will later go in and write notes on them- will say "notes from Dr. Arlene Ben"   No changes today unless labs lead us  to make changes  Recommended follow up: Return in about 2 months (around 04/13/2024) for followup or sooner if needed.Schedule b4 you leave.

## 2024-02-13 ENCOUNTER — Ambulatory Visit: Payer: Self-pay | Admitting: Family Medicine

## 2024-02-14 ENCOUNTER — Other Ambulatory Visit: Payer: Self-pay

## 2024-02-14 DIAGNOSIS — R748 Abnormal levels of other serum enzymes: Secondary | ICD-10-CM

## 2024-02-25 ENCOUNTER — Other Ambulatory Visit: Payer: Self-pay | Admitting: Family Medicine

## 2024-02-25 ENCOUNTER — Other Ambulatory Visit: Payer: Self-pay | Admitting: Internal Medicine

## 2024-03-09 ENCOUNTER — Other Ambulatory Visit (INDEPENDENT_AMBULATORY_CARE_PROVIDER_SITE_OTHER)

## 2024-03-09 ENCOUNTER — Encounter: Payer: Self-pay | Admitting: Physician Assistant

## 2024-03-09 DIAGNOSIS — R748 Abnormal levels of other serum enzymes: Secondary | ICD-10-CM | POA: Diagnosis not present

## 2024-03-09 LAB — BASIC METABOLIC PANEL WITH GFR
BUN: 27 mg/dL — ABNORMAL HIGH (ref 6–23)
CO2: 27 meq/L (ref 19–32)
Calcium: 9.4 mg/dL (ref 8.4–10.5)
Chloride: 106 meq/L (ref 96–112)
Creatinine, Ser: 1.04 mg/dL (ref 0.40–1.20)
GFR: 50.02 mL/min — ABNORMAL LOW (ref >60.00)
Glucose, Bld: 90 mg/dL (ref 70–99)
Potassium: 4.1 meq/L (ref 3.5–5.1)
Sodium: 141 meq/L (ref 135–145)

## 2024-03-10 ENCOUNTER — Ambulatory Visit: Payer: Self-pay | Admitting: Family Medicine

## 2024-03-18 DIAGNOSIS — H401422 Capsular glaucoma with pseudoexfoliation of lens, left eye, moderate stage: Secondary | ICD-10-CM | POA: Diagnosis not present

## 2024-03-18 DIAGNOSIS — H26491 Other secondary cataract, right eye: Secondary | ICD-10-CM | POA: Diagnosis not present

## 2024-03-18 DIAGNOSIS — H401413 Capsular glaucoma with pseudoexfoliation of lens, right eye, severe stage: Secondary | ICD-10-CM | POA: Diagnosis not present

## 2024-03-18 DIAGNOSIS — H35373 Puckering of macula, bilateral: Secondary | ICD-10-CM | POA: Diagnosis not present

## 2024-03-18 DIAGNOSIS — Z961 Presence of intraocular lens: Secondary | ICD-10-CM | POA: Diagnosis not present

## 2024-04-16 ENCOUNTER — Ambulatory Visit: Admitting: Family Medicine

## 2024-04-16 ENCOUNTER — Encounter: Payer: Self-pay | Admitting: Family Medicine

## 2024-04-16 ENCOUNTER — Ambulatory Visit: Payer: Self-pay | Admitting: Family Medicine

## 2024-04-16 VITALS — BP 108/68 | HR 62 | Temp 97.6°F | Ht 63.0 in | Wt 134.4 lb

## 2024-04-16 DIAGNOSIS — I25119 Atherosclerotic heart disease of native coronary artery with unspecified angina pectoris: Secondary | ICD-10-CM

## 2024-04-16 DIAGNOSIS — I10 Essential (primary) hypertension: Secondary | ICD-10-CM | POA: Diagnosis not present

## 2024-04-16 DIAGNOSIS — E782 Mixed hyperlipidemia: Secondary | ICD-10-CM | POA: Diagnosis not present

## 2024-04-16 LAB — COMPREHENSIVE METABOLIC PANEL WITH GFR
ALT: 11 U/L (ref 0–35)
AST: 15 U/L (ref 0–37)
Albumin: 4.6 g/dL (ref 3.5–5.2)
Alkaline Phosphatase: 48 U/L (ref 39–117)
BUN: 20 mg/dL (ref 6–23)
CO2: 27 meq/L (ref 19–32)
Calcium: 9.4 mg/dL (ref 8.4–10.5)
Chloride: 104 meq/L (ref 96–112)
Creatinine, Ser: 1.08 mg/dL (ref 0.40–1.20)
GFR: 47.77 mL/min — ABNORMAL LOW (ref >60.00)
Glucose, Bld: 86 mg/dL (ref 70–99)
Potassium: 4.2 meq/L (ref 3.5–5.1)
Sodium: 142 meq/L (ref 135–145)
Total Bilirubin: 0.7 mg/dL (ref 0.2–1.2)
Total Protein: 6.4 g/dL (ref 6.0–8.3)

## 2024-04-16 LAB — LDL CHOLESTEROL, DIRECT: Direct LDL: 63 mg/dL

## 2024-04-16 NOTE — Patient Instructions (Addendum)
 Please stop by lab before you go If you have mychart- we will send your results within 3 business days of us  receiving them.  If you do not have mychart- we will call you about results within 5 business days of us  receiving them.  *please also note that you will see labs on mychart as soon as they post. I will later go in and write notes on them- will say notes from Dr. Katrinka   Thanks for restarting Repatha !   Make sure to stay well hydrated to help your kidneys  Recommended follow up: Return in about 2 months (around 06/16/2024) for followup or sooner if needed.Schedule b4 you leave.

## 2024-04-16 NOTE — Progress Notes (Signed)
 Phone [phone] In person visit   Subjective:   [name] is a 83 y.o. year old very pleasant female patient who presents for/with See problem oriented charting Chief Complaint  Patient presents with   Medical Management of Chronic Issues   Hypertension   Past Medical History-  Patient Active Problem List   Diagnosis Date Noted   CAD (coronary artery disease) s/p stent mCx 05/2019 06/02/2019    Priority: High   Grieving 02/18/2017    Priority: High   Osteoporosis 11/05/2016    Priority: High   Anxiety 11/27/2022    Priority: Medium    Aortic atherosclerosis (HCC) 09/15/2020    Priority: Medium    Chest pain 12/29/2018    Priority: Medium    Primary open-angle glaucoma 11/20/2017    Priority: Medium    Hypothyroidism 07/01/2013    Priority: Medium    Hypertension 11/02/2012    Priority: Medium    Hyperlipidemia 07/24/2011    Priority: Medium    hx: breast cancer, IDC (Microinvasive) w DCIS, 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 45 tablet 2   Ascorbic Acid (VITAMIN C PO) Take by mouth. 1 gummy daily     aspirin  EC 81 MG tablet Take 81 mg by mouth daily.     Bimatoprost  (LUMIGAN  OP) Place 1 drop into both eyes at bedtime.      chlorthalidone  (HYGROTON ) 25 MG tablet TAKE ONE TABLET BY MOUTH ONCE DAILY. 90 tablet 3   Cholecalciferol  (VITAMIN D -3) 5000 UNITS TABS Take 5,000 mg by mouth daily.     dorzolamide -timolol  (COSOPT ) 22.3-6.8 MG/ML ophthalmic solution Place 1 drop into both eyes 2 (two)  times daily.      Evolocumab  (REPATHA  SURECLICK) 140 MG/ML SOAJ INJECT 140MG  UNDER THE SKIN EVERY 2 WEEKS 6 mL 3   famotidine  (PEPCID ) 20 MG tablet TAKE 1 TABLET BY MOUTH TWICE DAILY. 180 tablet 3   levothyroxine  (SYNTHROID ) 50 MCG tablet TAKE ONE TABLET BY MOUTH BEFORE BREAKFAST 90 tablet 3   metoprolol  tartrate (LOPRESSOR ) 25 MG tablet TAKE ONE TABLET BY MOUTH TWICE DAILY 180 tablet 0   moxifloxacin (VIGAMOX) 0.5 % ophthalmic solution Place into the right eye.     NEXLETOL  180 MG TABS TAKE 1 TABLET EVERY DAY 90 tablet 3   nitroGLYCERIN  (NITROSTAT ) 0.4 MG SL tablet Place 1 tablet (0.4 mg total) under the tongue every 5 (five) minutes x 3 doses as needed for chest pain (up to three times). 30 tablet 0   pilocarpine  (PILOCAR) 1 % ophthalmic solution Place 1 drop into both eyes 2 (two) times daily.      Polyethyl Glycol-Propyl Glycol (SYSTANE OP) Place 1-2 drops into both eyes as needed (for dry eyes).      polyethylene glycol powder (GLYCOLAX/MIRALAX) 17 GM/SCOOP powder Take 1 Container by mouth daily.     potassium chloride  (KLOR-CON ) 10 MEQ tablet Take 2 tablets (20 mEq total) by mouth 2 (two) times daily. 240 tablet 3  prednisoLONE  acetate (PRED FORTE ) 1 % ophthalmic suspension Place 1 drop into the right eye 4 (four) times daily.     Probiotic Product (PROBIOTIC DAILY) CAPS Take 1 capsule by mouth daily. Reported on 10/10/2015     telmisartan  (MICARDIS ) 40 MG tablet Take 1 tablet (40 mg total) by mouth daily. 90 tablet 3   VASCEPA  1 g capsule TAKE 1 CAPSULE TWICE DAILY 180 capsule 3   Wheat Dextrin (BENEFIBER DRINK MIX PO) Take by mouth.     Wheat Dextrin (BENEFIBER DRINK MIX) PACK Take by mouth.     No current facility-administered medications for this visit.     Objective:  BP 108/68 (BP Location: Right Arm, Patient Position: Sitting, Cuff Size: Normal)   Pulse 62   Temp 97.6 F (36.4 C) (Temporal)   Ht 5' 3 (1.6 m)   Wt 134 lb 6.4 oz (61 kg)   SpO2 97%   BMI 23.81 kg/m  Gen:  NAD, resting comfortably CV: RRR no murmurs rubs or gallops Lungs: CTAB no crackles, wheeze, rhonchi Ext: trace edema Skin: warm, dry     Assessment and Plan   # Prolonged grieving/depression/anxiety-extremely close to her now deceased spouse and challenging transition since his passing February 2019 -going to do grieving group at church- 6 women with lead from [facility]. Good friendship she has developed there -church has been very supportive -feels she is doing better lately -still considering [facility] or [facility] but wants to hold off as long as possible  #CAD-follows with Dr. Okey on aspirin  #hyperlipidemia- statin myalgia S: Medication: Repatha  140 mg every 2 weeks- off last visit but restarted, nexletol /bempedoic acid  180mg  daily and Vascepa  1 g  (prior clear study- stopped mid 2022), aspirin  81 mg - has worked on [employer]- down 3 lbs - encouraged stabilization. Previously worked with nutritionist in the past and was helpful -no chest pain or shortness of breath  Lab Results  Component Value Date   CHOL 206 (H) 02/12/2024   HDL 52.10 02/12/2024   LDLCALC 136 (H) 02/12/2024   LDLDIRECT 54.0 06/01/2021   TRIG 92.0 02/12/2024   CHOLHDL 4 02/12/2024   A/P: hoping LDL better back on Repatha - continue current medications  Coronary artery disease asymptomatic - continue current medications including aspirin   #hypertension S: medication: Metoprolol  25 mg twice daily, amlodipine  2.5 mg (with 2nd dose if needed for high BP over 150 given variability), telmisartan  40 mg per Dr. Okey, chlorthalidone  25 mg -[facility] at [facility] for exercise- 3 days a week - goes with a friend BP Readings from Last 3 Encounters:  04/16/24 108/68  02/12/24 122/64  12/09/23 132/60  A/P: blood pressure well controlled continue current medications   #hypothyroidism S: compliant On thyroid  medication-levothyroxine  50 mcg Lab Results  Component Value Date   TSH 1.63 02/12/2024    A/P:well controlled continue current medications    #prior creatinine increase- did better with increasing fluids on recheck a month ago with GFR back into 50's -encouraged to push more fluids  Recommended follow up: Return in about 2 months (around 06/16/2024) for followup or sooner if needed.Schedule b4 you leave. Future Appointments  Date Time Provider Department Center  05/15/2024  1:40 PM Okey Vina GAILS, MD [facility]  08/17/2024  8:00 AM [facility] ANNUAL WELLNESS VISIT 1 [facility]    Lab/Order associations: fasting   ICD-10-CM   1. Primary hypertension  I10 Comprehensive metabolic panel with GFR    LDL cholesterol, direct    2.  Mixed hyperlipidemia  E78.2 Comprehensive metabolic panel with GFR    LDL cholesterol, direct    3. Coronary artery disease involving native coronary artery of native heart with angina pectoris (HCC)  I25.119       No orders of the defined types were placed in this encounter.   Return precautions advised.  Garnette Lukes, MD

## 2024-05-14 NOTE — Progress Notes (Unsigned)
 Cardiology Office Note   Date:  05/15/2024   ID:  Earleen, Aoun 1941-04-17, MRN [id number]  PCP:  Katrinka Garnette KIDD, MD  Cardiologist:   Vina Gull, MD    Patient presents for f/u of CAD    History of Present Illness: [name] is a 83 y.o. female with a history of HTN, HL and  CAD    April 2020 admitted for CP.  CT chest/abd showed extensive atherosclerosis. CCTA showed no obstructive lesions    September 2020.  Patient admitted with an NSTEMI.  LHC showed severe single-vessel disease with a critical stenosis of the left circumflex.  She underwent PTCA/DES to this area.  There was nonobstructive disease noted in the LAD.  Plan for dual antiplatelet therapy for 12 month  April 2022 chest pressure   Felt noncardiac   Lexiscan  myoview  was normal     October 21, 2023  Went to ER with CP   Lexiscan  myoview  11/05/23 normal     I saw the pt in March 2025  Since seen she has done OK   Breathing is good   She denies CP   No dizziness Staying fairly active    PT still mourning death of husband   A little tearful  Better than previous.  Current Meds  Medication Sig   amLODipine  (NORVASC ) 5 MG tablet Take 0.5 tablets (2.5 mg total) by mouth daily.   Ascorbic Acid (VITAMIN C PO) Take by mouth. 1 gummy daily   aspirin  EC 81 MG tablet Take 81 mg by mouth daily.   Bimatoprost  (LUMIGAN  OP) Place 1 drop into both eyes at bedtime.    chlorthalidone  (HYGROTON ) 25 MG tablet TAKE ONE TABLET BY MOUTH ONCE DAILY.   Cholecalciferol  (VITAMIN D -3) 5000 UNITS TABS Take 5,000 mg by mouth daily.   dorzolamide -timolol  (COSOPT ) 22.3-6.8 MG/ML ophthalmic solution Place 1 drop into both eyes 2 (two) times daily.    Evolocumab  (REPATHA  SURECLICK) 140 MG/ML SOAJ INJECT 140MG  UNDER THE SKIN EVERY 2 WEEKS   famotidine  (PEPCID ) 20 MG tablet TAKE 1 TABLET BY MOUTH TWICE DAILY.   levothyroxine  (SYNTHROID ) 50 MCG tablet TAKE ONE TABLET BY MOUTH BEFORE BREAKFAST   metoprolol  tartrate (LOPRESSOR ) 25 MG  tablet TAKE ONE TABLET BY MOUTH TWICE DAILY   NEXLETOL  180 MG TABS TAKE 1 TABLET EVERY DAY   nitroGLYCERIN  (NITROSTAT ) 0.4 MG SL tablet Place 1 tablet (0.4 mg total) under the tongue every 5 (five) minutes x 3 doses as needed for chest pain (up to three times).   pilocarpine  (PILOCAR) 1 % ophthalmic solution Place 1 drop into both eyes 2 (two) times daily.    Polyethyl Glycol-Propyl Glycol (SYSTANE OP) Place 1-2 drops into both eyes as needed (for dry eyes).    polyethylene glycol powder (GLYCOLAX/MIRALAX) 17 GM/SCOOP powder Take 1 Container by mouth daily. (Patient taking differently: Take 1 Container by mouth as needed.)   potassium chloride  (KLOR-CON ) 10 MEQ tablet Take 2 tablets (20 mEq total) by mouth 2 (two) times daily.   Probiotic Product (PROBIOTIC DAILY) CAPS Take 1 capsule by mouth daily. Reported on 10/10/2015   telmisartan  (MICARDIS ) 40 MG tablet Take 1 tablet (40 mg total) by mouth daily.   VASCEPA  1 g capsule TAKE 1 CAPSULE TWICE DAILY   Wheat Dextrin (BENEFIBER DRINK MIX PO) Take by mouth.   Wheat Dextrin (BENEFIBER DRINK MIX) PACK Take by mouth.   [DISCONTINUED] prednisoLONE  acetate (PRED FORTE ) 1 % ophthalmic suspension Place 1 drop into the  right eye 4 (four) times daily.     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 Dr. Leora   CARDIAC CATHETERIZATION     COLONOSCOPY     CORONARY STENT INTERVENTION N/A 06/01/2019   Procedure: CORONARY  STENT INTERVENTION;  Surgeon: Wonda Sharper, MD;  Location: [facility] INVASIVE CV LAB;  Service: Cardiovascular;  Laterality: N/A;   CRYOTHERAPY     GYN for CIN III   DILATION AND CURETTAGE OF UTERUS     EXCISION MORTON'S NEUROMA     right foot   EYE SURGERY Right 05/08/2018   LEFT HEART CATH AND CORONARY ANGIOGRAPHY N/A 06/01/2019   Procedure: LEFT HEART CATH AND CORONARY ANGIOGRAPHY;  Surgeon: Wonda Sharper, MD;  Location: [facility] INVASIVE CV LAB;  Service: Cardiovascular;  Laterality: N/A;   MASTECTOMY     REDUCTION MAMMAPLASTY Right      Social History:  The patient  reports that she has never smoked. She has never been exposed to tobacco smoke. She has never used smokeless tobacco. She reports current alcohol  use. She reports that she does not use drugs.   Family History:  The patient's family history includes CAD in her mother; Colon cancer in her paternal grandmother; Hyperlipidemia in her mother and sister; Other in her father; Parkinsonism in her mother; Pneumonia in her mother.    ROS:  Please see the history of present illness. All other systems are reviewed and  Negative to the above problem except as noted.    PHYSICAL EXAM: VS:  BP (!) 130/90   Pulse 69   Ht 5' 4 (1.626 m)   Wt 137 lb 3.2 oz (62.2 kg)   SpO2 99%   BMI 23.55 kg/m   GEN: Pt is a 83 yo female in no acute distress  Neck: no JVD no bruits Cardiac: RRR; no murmurs  Respiratory: Clear to auscultation Ext  No LE edema    EKG:  EKG is not ordered today.     Lipid Panel    Component Value Date/Time   CHOL 206 (H) 02/12/2024 1112   CHOL 131 10/27/2020 0936   TRIG 92.0 02/12/2024 1112   HDL 52.10 02/12/2024 1112   HDL 64 10/27/2020 0936   CHOLHDL 4 02/12/2024 1112   VLDL 18.4 02/12/2024 1112   LDLCALC 136 (H) 02/12/2024 1112   LDLCALC 53 10/27/2020 0936   LDLCALC 30 05/12/2020 1136   LDLDIRECT 63.0 04/16/2024 1205      Wt Readings from Last 3 Encounters:  05/15/24 137 lb 3.2 oz (62.2 kg)  04/16/24  134 lb 6.4 oz (61 kg)  02/12/24 137 lb 9.6 oz (62.4 kg)    Cardiac Studies  CORONARY STENT INTERVENTION 05/2019  LEFT HEART CATH AND CORONARY ANGIOGRAPHY  Conclusion   1) Severe single vessel CAD with critical stenosis of the mid-circumflex, treated successfully with PTCA and stenting (2.5x12 mm Synergy DES  2) nonobstructive LAD stenosis 3) widely patent RCA, dominant vessel 4) normal LV systolic function   Recommend: DAPT with ASA and ticagrelor  x 12 months without interruption. Pt loaded with ticagrelor  180 mg on the cath lab table. DC home tomorrow am if no complications arise.      ASSESSMENT AND PLAN:  1  CAD  s/p PTCA/DES in 2020.  Has had intermitt chest pains   Last Lexiscan  Myoview  is without ischemia in February 2025.   Denies CP now   FOllow   2  HTN  Diastolic BP a little high   Follow   3  HL  LDL 136  She had stopped meds  this summer   Resumed    Will set up repeat NMR panel   4 PAD  PT with atherosclerosis of aorta     Rx lipids   Follow up in 6 months  Sooner for problems   Current medicines are reviewed at length with the patient today.  The patient does not have concerns regarding medicines.  Signed, Vina Gull, MD  05/15/2024 2:25 PM    [facility] [address] [place], [place], [place]  [address] Phone: [phone]; Fax: [phone]

## 2024-05-15 ENCOUNTER — Encounter: Payer: Self-pay | Admitting: Internal Medicine

## 2024-05-15 ENCOUNTER — Ambulatory Visit: Attending: Internal Medicine | Admitting: Internal Medicine

## 2024-05-15 VITALS — BP 130/90 | HR 69 | Ht 64.0 in | Wt 137.2 lb

## 2024-05-15 DIAGNOSIS — I2510 Atherosclerotic heart disease of native coronary artery without angina pectoris: Secondary | ICD-10-CM | POA: Diagnosis not present

## 2024-05-15 NOTE — Patient Instructions (Signed)
 Medication Instructions:  Your physician recommends that you continue on your current medications as directed. Please refer to the Current Medication list given to you today.  *If you need a refill on your cardiac medications before your next appointment, please call your pharmacy*  Follow-Up: At [facility], you and your health needs are our priority.  As part of our continuing mission to provide you with exceptional heart care, our providers are all part of one team.  This team includes your primary Cardiologist (physician) and Advanced Practice Providers or APPs (Physician Assistants and Nurse Practitioners) who all work together to provide you with the care you need, when you need it.  Your next appointment:   7-8 month(s)  Provider:   Vina Gull, MD

## 2024-05-26 ENCOUNTER — Other Ambulatory Visit (HOSPITAL_BASED_OUTPATIENT_CLINIC_OR_DEPARTMENT_OTHER): Payer: Self-pay

## 2024-05-28 ENCOUNTER — Other Ambulatory Visit: Payer: Self-pay | Admitting: Family Medicine

## 2024-06-04 DIAGNOSIS — H401413 Capsular glaucoma with pseudoexfoliation of lens, right eye, severe stage: Secondary | ICD-10-CM | POA: Diagnosis not present

## 2024-06-09 ENCOUNTER — Other Ambulatory Visit: Payer: Self-pay | Admitting: Family Medicine

## 2024-06-10 ENCOUNTER — Other Ambulatory Visit: Payer: Self-pay | Admitting: Family Medicine

## 2024-06-10 DIAGNOSIS — Z1231 Encounter for screening mammogram for malignant neoplasm of breast: Secondary | ICD-10-CM

## 2024-06-17 ENCOUNTER — Encounter: Payer: Self-pay | Admitting: Family Medicine

## 2024-06-17 ENCOUNTER — Ambulatory Visit: Admitting: Family Medicine

## 2024-06-17 VITALS — BP 120/60 | HR 68 | Temp 97.4°F | Ht 64.0 in | Wt 134.0 lb

## 2024-06-17 DIAGNOSIS — E782 Mixed hyperlipidemia: Secondary | ICD-10-CM

## 2024-06-17 DIAGNOSIS — I10 Essential (primary) hypertension: Secondary | ICD-10-CM | POA: Diagnosis not present

## 2024-06-17 DIAGNOSIS — I25119 Atherosclerotic heart disease of native coronary artery with unspecified angina pectoris: Secondary | ICD-10-CM

## 2024-06-17 DIAGNOSIS — E039 Hypothyroidism, unspecified: Secondary | ICD-10-CM

## 2024-06-17 NOTE — Progress Notes (Signed)
 Phone [phone] In person visit   Subjective:   [name] is a 83 y.o. year old very pleasant female patient who presents for/with See problem oriented charting Chief Complaint  Patient presents with   Hypertension    Has been having low bp at night sometimes and not feeling well; has skipped a dose or two due to it being low;    Past Medical History-  Patient Active Problem List   Diagnosis Date Noted   CAD (coronary artery disease) s/p stent mCx 05/2019 06/02/2019    Priority: High   Grieving 02/18/2017    Priority: High   Osteoporosis 11/05/2016    Priority: High   Anxiety 11/27/2022    Priority: Medium    Aortic atherosclerosis 09/15/2020    Priority: Medium    Chest pain 12/29/2018    Priority: Medium    Primary open-angle glaucoma 11/20/2017    Priority: Medium    Hypothyroidism 07/01/2013    Priority: Medium    Hypertension 11/02/2012    Priority: Medium    Hyperlipidemia 07/24/2011    Priority: Medium    hx: breast cancer, IDC (Microinvasive) w DCIS, 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 45 tablet 2   Ascorbic Acid (VITAMIN C PO) Take by mouth. 1 gummy daily     aspirin  EC 81 MG tablet Take 81 mg by mouth daily.     Bimatoprost  (LUMIGAN  OP) Place 1 drop into both eyes at bedtime.      chlorthalidone  (HYGROTON ) 25 MG tablet TAKE ONE TABLET BY MOUTH ONCE DAILY. 90 tablet 3   Cholecalciferol  (VITAMIN D -3) 5000 UNITS TABS Take 5,000 mg by mouth daily.     dorzolamide -timolol  (COSOPT )  22.3-6.8 MG/ML ophthalmic solution Place 1 drop into both eyes 2 (two) times daily.      Evolocumab  (REPATHA  SURECLICK) 140 MG/ML SOAJ INJECT 140MG  UNDER THE SKIN EVERY 2 WEEKS 6 mL 3   famotidine  (PEPCID ) 20 MG tablet TAKE 1 TABLET BY MOUTH TWICE DAILY. 180 tablet 0   levothyroxine  (SYNTHROID ) 50 MCG tablet TAKE ONE TABLET BY MOUTH BEFORE BREAKFAST 90 tablet 3   metoprolol  tartrate (LOPRESSOR ) 25 MG tablet TAKE ONE TABLET BY MOUTH TWICE DAILY 180 tablet 0   NEXLETOL  180 MG TABS TAKE 1 TABLET EVERY DAY 90 tablet 3   nitroGLYCERIN  (NITROSTAT ) 0.4 MG SL tablet Place 1 tablet (0.4 mg total) under the tongue every 5 (five) minutes x 3 doses as needed for chest pain (up to three times). 30 tablet 0   pilocarpine  (PILOCAR) 1 % ophthalmic solution Place 1 drop into both eyes 2 (two) times daily.      Polyethyl Glycol-Propyl Glycol (SYSTANE OP) Place 1-2 drops into both eyes as needed (for dry eyes).      polyethylene glycol powder (GLYCOLAX/MIRALAX) 17 GM/SCOOP powder Take 1 Container by mouth daily. (Patient taking differently: Take 1 Container by mouth as needed.)     potassium chloride  (KLOR-CON ) 10 MEQ tablet Take 2 tablets (  20 mEq total) by mouth 2 (two) times daily. 240 tablet 3   Probiotic Product (PROBIOTIC DAILY) CAPS Take 1 capsule by mouth daily. Reported on 10/10/2015     telmisartan  (MICARDIS ) 40 MG tablet Take 1 tablet (40 mg total) by mouth daily. 90 tablet 3   VASCEPA  1 g capsule TAKE 1 CAPSULE TWICE DAILY 180 capsule 3   Wheat Dextrin (BENEFIBER DRINK MIX) PACK Take by mouth.     No current facility-administered medications for this visit.     Objective:  BP 120/60   Pulse 68   Temp (!) 97.4 F (36.3 C) (Temporal)   Ht 5' 4 (1.626 m)   Wt 134 lb (60.8 kg)   SpO2 98%   BMI 23.00 kg/m  Gen: NAD, resting comfortably CV: RRR no murmurs rubs or gallops Lungs: CTAB no crackles, wheeze, rhonchi Ext: trace edema Skin: warm, dry     Assessment and Plan   #CAD-follows with Dr.  Okey on aspirin  #hyperlipidemia- statin myalgia S: Medication: Repatha  140 mg every 2 weeks, nexletol /bempedoic acid  180mg  daily and Vascepa  1 g  (prior clear study- stopped mid 2022), aspirin  81 mg -no chest pain or shortness of breath   A/P: LDL at goal 63 in August- doing well- continue current medications . Coronary artery disease asymptomatic continue current medications   #hypertension S: medication: Metoprolol  25 mg twice daily, amlodipine  2.5 mg (with 2nd dose if needed for high BP over 150 given variability), telmisartan  40 mg per Dr. Okey, chlorthalidone  25 mg - had an episode the other night where blood pressure was lower around 110. She opted to skip doses of metoprolol  and amlodipine . Has lost several pounds and may contribute to lower presures . She had worked with nutritionist in past- helped her get some healthier habits -also exercising at [employer] regularly  BP Readings from Last 3 Encounters:  06/17/24 120/60  05/15/24 (!) 130/90  04/16/24 108/68  A/P: blood pressure running lower as she has lost weight- did have one low reading at home but adjusted appopriately by holding blood pressure medicine that evening- if she has many more of these episodes may drop the amlodipine  completely - she will reach out  #hypothyroidism S: compliant On thyroid  medication-levothyroxine  50 mcg  Lab Results  Component Value Date   TSH 1.63 02/12/2024   A/P: stable- continue current medicines    #Vitamin D  deficiency S: Medication: 5000 units daily  Last vitamin D  Lab Results  Component Value Date   VD25OH 43.77 08/01/2023  A/P: vitamin D  in healthy range- check with next labs   #Health maintenance- upcoming mammogram  Recommended follow up: Return in about 2 months (around 08/17/2024) for followup or sooner if needed.Schedule b4 you leave. Future Appointments  Date Time Provider Department Center  06/18/2024 12:50 PM [facility] 2 [facility]  08/17/2024  8:00 AM [facility]  ANNUAL WELLNESS VISIT 1 [facility] [place]    Lab/Order associations:   ICD-10-CM   1. Primary hypertension  I10     2. Mixed hyperlipidemia  E78.2     3. Hypothyroidism, unspecified type  E03.9     4. Coronary artery disease involving native coronary artery of native heart with angina pectoris  I25.119       No orders of the defined types were placed in this encounter.   Return precautions advised.  Garnette Lukes, MD

## 2024-06-17 NOTE — Patient Instructions (Addendum)
 blood pressure running lower as she has lost weight- did have one low reading at home but adjusted appopriately by holding blood pressure medicine that evening- if she has many more of these episodes may drop the amlodipine  completely - she will reach out  You are doing phenomenal overall! Keep up the good work  Recommended follow up: Return in about 2 months (around 08/17/2024) for followup or sooner if needed.Schedule b4 you leave. See if you can get a 4 month physical- if not- schedule one in 6 months

## 2024-06-18 ENCOUNTER — Ambulatory Visit
Admission: RE | Admit: 2024-06-18 | Discharge: 2024-06-18 | Disposition: A | Source: Ambulatory Visit | Attending: Family Medicine | Admitting: Family Medicine

## 2024-06-18 ENCOUNTER — Ambulatory Visit

## 2024-06-18 DIAGNOSIS — Z1231 Encounter for screening mammogram for malignant neoplasm of breast: Secondary | ICD-10-CM | POA: Diagnosis not present

## 2024-07-01 ENCOUNTER — Other Ambulatory Visit: Payer: Self-pay | Admitting: Family Medicine

## 2024-07-14 ENCOUNTER — Other Ambulatory Visit: Payer: Self-pay | Admitting: Internal Medicine

## 2024-07-14 DIAGNOSIS — I2510 Atherosclerotic heart disease of native coronary artery without angina pectoris: Secondary | ICD-10-CM

## 2024-07-14 DIAGNOSIS — I700 Atherosclerosis of aorta: Secondary | ICD-10-CM

## 2024-07-27 DIAGNOSIS — H401422 Capsular glaucoma with pseudoexfoliation of lens, left eye, moderate stage: Secondary | ICD-10-CM | POA: Diagnosis not present

## 2024-07-27 DIAGNOSIS — Z961 Presence of intraocular lens: Secondary | ICD-10-CM | POA: Diagnosis not present

## 2024-07-27 DIAGNOSIS — H401413 Capsular glaucoma with pseudoexfoliation of lens, right eye, severe stage: Secondary | ICD-10-CM | POA: Diagnosis not present

## 2024-07-31 ENCOUNTER — Ambulatory Visit: Admitting: Family Medicine

## 2024-07-31 ENCOUNTER — Encounter: Payer: Self-pay | Admitting: Family Medicine

## 2024-07-31 VITALS — BP 124/72 | HR 74 | Temp 97.3°F | Ht 64.0 in | Wt 133.0 lb

## 2024-07-31 DIAGNOSIS — I10 Essential (primary) hypertension: Secondary | ICD-10-CM

## 2024-07-31 DIAGNOSIS — E782 Mixed hyperlipidemia: Secondary | ICD-10-CM | POA: Diagnosis not present

## 2024-07-31 DIAGNOSIS — E039 Hypothyroidism, unspecified: Secondary | ICD-10-CM

## 2024-07-31 DIAGNOSIS — F4321 Adjustment disorder with depressed mood: Secondary | ICD-10-CM | POA: Diagnosis not present

## 2024-07-31 NOTE — Patient Instructions (Addendum)
 We considered counseling but you wanted to hold off until December 8th visit and discuss again  No changes today and return to your normal blood pressure regimen  Recommended follow up: Return for next already scheduled visit or sooner if needed.

## 2024-07-31 NOTE — Progress Notes (Signed)
 Phone [phone] In person visit   Subjective:   [name] is a 83 y.o. year old very pleasant female patient who presents for/with See problem oriented charting Chief Complaint  Patient presents with   Medical Management of Chronic Issues    Pt states her bp is creeping up again; states she's having a hard time with grief since its getting close to the holidays; will do flu and covid shot at [facility];    Past Medical History-  Patient Active Problem List   Diagnosis Date Noted   CAD (coronary artery disease) s/p stent mCx 05/2019 06/02/2019    Priority: High   Grieving 02/18/2017    Priority: High   Osteoporosis 11/05/2016    Priority: High   Anxiety 11/27/2022    Priority: Medium    Aortic atherosclerosis 09/15/2020    Priority: Medium    Chest pain 12/29/2018    Priority: Medium    Primary open-angle glaucoma 11/20/2017    Priority: Medium    Hypothyroidism 07/01/2013    Priority: Medium    Hypertension 11/02/2012    Priority: Medium    Hyperlipidemia 07/24/2011    Priority: Medium    hx: breast cancer, IDC (Microinvasive) w DCIS, 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 45 tablet 2   Ascorbic Acid (VITAMIN C PO) Take by mouth. 1 gummy daily     aspirin  EC 81 MG tablet Take 81 mg by mouth daily.     Bimatoprost  (LUMIGAN  OP) Place 1 drop into both eyes at bedtime.      chlorthalidone  (HYGROTON ) 25 MG tablet TAKE ONE TABLET BY MOUTH ONCE DAILY. 90 tablet 3   Cholecalciferol  (VITAMIN  D-3) 5000 UNITS TABS Take 5,000 mg by mouth daily.     dorzolamide -timolol  (COSOPT ) 22.3-6.8 MG/ML ophthalmic solution Place 1 drop into both eyes 2 (two) times daily.      famotidine  (PEPCID ) 20 MG tablet TAKE 1 TABLET BY MOUTH TWICE DAILY. 180 tablet 0   levothyroxine  (SYNTHROID ) 50 MCG tablet TAKE ONE TABLET BY MOUTH BEFORE BREAKFAST 90 tablet 3   metoprolol  tartrate (LOPRESSOR ) 25 MG tablet TAKE ONE TABLET BY MOUTH TWICE DAILY 180 tablet 0   NEXLETOL  180 MG TABS TAKE 1 TABLET EVERY DAY 90 tablet 3   nitroGLYCERIN  (NITROSTAT ) 0.4 MG SL tablet Place 1 tablet (0.4 mg total) under the tongue every 5 (five) minutes x 3 doses as needed for chest pain (up to three times). 30 tablet 0   pilocarpine  (PILOCAR) 1 % ophthalmic solution Place 1 drop into both eyes 2 (two) times daily.      Polyethyl Glycol-Propyl Glycol (SYSTANE OP) Place 1-2 drops into both eyes as needed (for dry eyes).      polyethylene glycol powder (GLYCOLAX/MIRALAX) 17 GM/SCOOP powder Take 1 Container by mouth daily. (Patient taking differently: Take 1 Container by mouth as needed.)     potassium chloride  (KLOR-CON ) 10 MEQ tablet Take 2 tablets (20 mEq total) by mouth 2 (  two) times daily. 240 tablet 0   Probiotic Product (PROBIOTIC DAILY) CAPS Take 1 capsule by mouth daily. Reported on 10/10/2015     REPATHA  SURECLICK 140 MG/ML SOAJ INJECT 140MG  UNDER THE SKIN EVERY 2 WEEKS 6 mL 3   telmisartan  (MICARDIS ) 40 MG tablet Take 1 tablet (40 mg total) by mouth daily. 90 tablet 3   VASCEPA  1 g capsule TAKE 1 CAPSULE TWICE DAILY 180 capsule 3   Wheat Dextrin (BENEFIBER DRINK MIX) PACK Take by mouth.     No current facility-administered medications for this visit.     Objective:  BP 124/72 (BP Location: Left Arm, Patient Position: Sitting, Cuff Size: Normal)   Pulse 74   Temp (!) 97.3 F (36.3 C) (Temporal)   Ht 5' 4 (1.626 m)   Wt 133 lb (60.3 kg)   SpO2 96%   BMI 22.83 kg/m  Gen: NAD, resting comfortably CV: RRR no murmurs rubs  or gallops Lungs: CTAB no crackles, wheeze, rhonchi.  Ext: no edema Skin: warm, dry     Assessment and Plan   #health maintenance - plans on flu and COVID at pharmacy   # Prolonged grieving/depression/anxiety-extremely close to her now deceased spouse and challenging transition since his passing February 2019 S: reports hard time with holidays upcoming. This is a hard time of year for her -still doing grief group - had meeting last week A/P: Ongoing grieving- We considered counseling but you wanted to hold off until December 8th visit and discuss again. Previously have considered medications but we held off today -could recheck TSH but this is not too atypical for her patterns in past and TSH typically controlled  #CAD-follows with Dr. Okey on aspirin  #PAD listed by Dr. Lawton does have aortic atherosclerosis #hyperlipidemia- statin myalgia S: Medication: Repatha  140 mg every 2 weeks, nexletol /bempedoic acid  180mg  daily and Vascepa  1 g  (prior clear study- stopped mid 2022), aspirin  81 mg  Lab Results  Component Value Date   CHOL 206 (H) 02/12/2024   HDL 52.10 02/12/2024   LDLCALC 136 (H) 02/12/2024   LDLDIRECT 63.0 04/16/2024   TRIG 92.0 02/12/2024   CHOLHDL 4 02/12/2024   A/P: lipids at goal on most recent check- continue current medications Coronary artery disease asymptomatic with no chest pain or shortness of breath- continue current medications   #hypertension S: medication: Metoprolol  25 mg twice daily, amlodipine  2.5 mg (with 2nd dose if needed for high BP over 150 given variability), telmisartan  40 mg per Dr. Okey, chlorthalidone  25 mg -took extra dose of amlodipine  2.5 mg this morning with high blood pressure as well as telmisartan  with blood pressure over 150. Did have headache(s) but no chest pain or shortness of breath  Home readings #s: usually well controlled 130s though but tries not to check too much -member at [facility] for exercise- biking 3 days a week   BP Readings from Last 3 Encounters:  07/31/24 124/72  06/17/24 120/60  05/15/24 (!) 130/90  A/P: blood pressure controlled today- had been high this morning- she made an appropriate adjustment and she can return to her prior schedule  #hypothyroidism S: compliant On thyroid  medication-levothyroxine  50 mcg  Lab Results  Component Value Date   TSH 1.63 02/12/2024   A/P:has been well controlled- continue current medications    Recommended follow up: Return for next already scheduled visit or sooner if needed. Future Appointments  Date Time Provider Department Center  08/17/2024  8:00 AM [facility] ANNUAL WELLNESS VISIT 1 [facility] [place]  08/17/2024 11:00 AM Katrinka Garnette KIDD, MD [facility] [place]  12/22/2024  1:00 PM Katrinka Garnette KIDD, MD [facility]    Lab/Order associations:   ICD-10-CM   1. Grieving  F43.21     2. Primary hypertension  I10     3. Hypothyroidism, unspecified type  E03.9     4. Mixed hyperlipidemia  E78.2       No orders of the defined types were placed in this encounter.   Return precautions advised.  Garnette Katrinka, MD

## 2024-08-12 ENCOUNTER — Emergency Department (HOSPITAL_BASED_OUTPATIENT_CLINIC_OR_DEPARTMENT_OTHER)

## 2024-08-12 ENCOUNTER — Emergency Department (HOSPITAL_BASED_OUTPATIENT_CLINIC_OR_DEPARTMENT_OTHER)
Admission: EM | Admit: 2024-08-12 | Discharge: 2024-08-12 | Disposition: A | Discharge status: Home / Self Care | Attending: Emergency Medicine | Admitting: Emergency Medicine

## 2024-08-12 ENCOUNTER — Other Ambulatory Visit: Payer: Self-pay

## 2024-08-12 DIAGNOSIS — I2510 Atherosclerotic heart disease of native coronary artery without angina pectoris: Secondary | ICD-10-CM | POA: Insufficient documentation

## 2024-08-12 DIAGNOSIS — Z79899 Other long term (current) drug therapy: Secondary | ICD-10-CM | POA: Diagnosis not present

## 2024-08-12 DIAGNOSIS — D649 Anemia, unspecified: Secondary | ICD-10-CM | POA: Diagnosis not present

## 2024-08-12 DIAGNOSIS — H409 Unspecified glaucoma: Secondary | ICD-10-CM | POA: Diagnosis not present

## 2024-08-12 DIAGNOSIS — Z853 Personal history of malignant neoplasm of breast: Secondary | ICD-10-CM | POA: Diagnosis not present

## 2024-08-12 DIAGNOSIS — R519 Headache, unspecified: Secondary | ICD-10-CM | POA: Diagnosis present

## 2024-08-12 DIAGNOSIS — Z7982 Long term (current) use of aspirin: Secondary | ICD-10-CM | POA: Insufficient documentation

## 2024-08-12 DIAGNOSIS — R29818 Other symptoms and signs involving the nervous system: Secondary | ICD-10-CM | POA: Diagnosis not present

## 2024-08-12 DIAGNOSIS — H538 Other visual disturbances: Secondary | ICD-10-CM | POA: Diagnosis not present

## 2024-08-12 DIAGNOSIS — I700 Atherosclerosis of aorta: Secondary | ICD-10-CM | POA: Diagnosis not present

## 2024-08-12 DIAGNOSIS — I10 Essential (primary) hypertension: Secondary | ICD-10-CM | POA: Diagnosis not present

## 2024-08-12 LAB — CBC
HCT: 35.4 % — ABNORMAL LOW (ref 36.0–46.0)
Hemoglobin: 11.5 g/dL — ABNORMAL LOW (ref 12.0–15.0)
MCH: 30.3 pg (ref 26.0–34.0)
MCHC: 32.5 g/dL (ref 30.0–36.0)
MCV: 93.2 fL (ref 80.0–100.0)
Platelets: 347 K/uL (ref 150–400)
RBC: 3.8 MIL/uL — ABNORMAL LOW (ref 3.87–5.11)
RDW: 12.2 % (ref 11.5–15.5)
WBC: 8.2 K/uL (ref 4.0–10.5)
nRBC: 0 % (ref 0.0–0.2)

## 2024-08-12 LAB — PROTIME-INR
INR: 0.9 (ref 0.8–1.2)
Prothrombin Time: 12.7 s (ref 11.4–15.2)

## 2024-08-12 LAB — CBG MONITORING, ED: Glucose-Capillary: 83 mg/dL (ref 70–99)

## 2024-08-12 LAB — DIFFERENTIAL
Abs Immature Granulocytes: 0.02 K/uL (ref 0.00–0.07)
Basophils Absolute: 0.1 K/uL (ref 0.0–0.1)
Basophils Relative: 1 %
Eosinophils Absolute: 0.3 K/uL (ref 0.0–0.5)
Eosinophils Relative: 4 %
Immature Granulocytes: 0 %
Lymphocytes Relative: 23 %
Lymphs Abs: 1.9 K/uL (ref 0.7–4.0)
Monocytes Absolute: 0.8 K/uL (ref 0.1–1.0)
Monocytes Relative: 10 %
Neutro Abs: 5 K/uL (ref 1.7–7.7)
Neutrophils Relative %: 62 %

## 2024-08-12 LAB — COMPREHENSIVE METABOLIC PANEL WITH GFR
ALT: 11 U/L (ref 0–44)
AST: 18 U/L (ref 15–41)
Albumin: 4.5 g/dL (ref 3.5–5.0)
Alkaline Phosphatase: 64 U/L (ref 38–126)
Anion gap: 12 (ref 5–15)
BUN: 24 mg/dL — ABNORMAL HIGH (ref 8–23)
CO2: 24 mmol/L (ref 22–32)
Calcium: 9.5 mg/dL (ref 8.9–10.3)
Chloride: 104 mmol/L (ref 98–111)
Creatinine, Ser: 1.2 mg/dL — ABNORMAL HIGH (ref 0.44–1.00)
GFR, Estimated: 45 mL/min — ABNORMAL LOW (ref >60)
Glucose, Bld: 96 mg/dL (ref 70–99)
Potassium: 4.1 mmol/L (ref 3.5–5.1)
Sodium: 140 mmol/L (ref 135–145)
Total Bilirubin: 0.5 mg/dL (ref 0.0–1.2)
Total Protein: 6.6 g/dL (ref 6.5–8.1)

## 2024-08-12 LAB — ETHANOL: Alcohol, Ethyl (B): <15 mg/dL (ref <15)

## 2024-08-12 LAB — APTT: aPTT: 30 s (ref 24–36)

## 2024-08-12 MED ORDER — TETRACAINE HCL 0.5 % OP SOLN
2.0000 [drp] | Freq: Once | OPHTHALMIC | Status: AC
  Administered 2024-08-12: 2 [drp] via OPHTHALMIC
  Filled 2024-08-12: qty 4

## 2024-08-12 MED ORDER — SODIUM CHLORIDE 0.9% FLUSH: 3.0000 mL | Freq: Once | INTRAVENOUS | Status: DC

## 2024-08-12 MED ORDER — IOHEXOL 350 MG/ML SOLN
75.0000 mL | Freq: Once | INTRAVENOUS | Status: AC | PRN
  Administered 2024-08-12: 75 mL via INTRAVENOUS

## 2024-08-12 NOTE — ED Provider Notes (Signed)
 [place] EMERGENCY DEPARTMENT AT [facility] Provider Note   CSN: [id number] Arrival date & time: 08/12/24  2128     Patient presents with: No chief complaint on file.   [name] is a 83 y.o. female.  She has a history of coronary disease, breast cancer, glaucoma which leaves her with a little bit of blurry vision in her right eye.  She said she had acute onset of a generalized headache around 8:00 that lasted for 10 minutes.  Later when she was looking at the mail she realized that she was having more difficulty reading the print with her right eye.  Left eye seemed normal.  She is not having any blurry vision with her distant vision.  She said with the glaucoma that her right eye was always worse but this seemed more than usual.  No difficulty speaking or walking no numbness or weakness.  No facial droop.  She said she overdid it today with putting up decorations.  {Add pertinent medical, surgical, social history, OB history to YEP:[id number]} The history is provided by the patient.  Eye Problem Location:  Right eye Quality: Blurry. Severity:  Moderate Timing:  Constant Progression:  Partially resolved Chronicity:  New Relieved by:  None tried Associated symptoms: blurred vision and headaches   Associated symptoms: no double vision, no nausea, no numbness, no vomiting and no weakness        Prior to Admission medications   Medication Sig Start Date End Date Taking? Authorizing Provider  amLODipine  (NORVASC ) 5 MG tablet Take 0.5 tablets (2.5 mg total) by mouth daily. 12/09/23   Okey Vina GAILS, MD  Ascorbic Acid (VITAMIN C PO) Take by mouth. 1 gummy daily    [provider]  aspirin  EC 81 MG tablet Take 81 mg by mouth daily.    [provider]  Bimatoprost  (LUMIGAN  OP) Place 1 drop into both eyes at bedtime.     [provider]  chlorthalidone  (HYGROTON ) 25 MG tablet TAKE ONE TABLET BY MOUTH ONCE DAILY. 02/26/24   Okey Vina GAILS, MD   Cholecalciferol  (VITAMIN D -3) 5000 UNITS TABS Take 5,000 mg by mouth daily.    [provider]  dorzolamide -timolol  (COSOPT ) 22.3-6.8 MG/ML ophthalmic solution Place 1 drop into both eyes 2 (two) times daily.     [provider]  famotidine  (PEPCID ) 20 MG tablet TAKE 1 TABLET BY MOUTH TWICE DAILY. 05/28/24   Katrinka Garnette KIDD, MD  levothyroxine  (SYNTHROID ) 50 MCG tablet TAKE ONE TABLET BY MOUTH BEFORE BREAKFAST 06/09/24   Katrinka Garnette KIDD, MD  metoprolol  tartrate (LOPRESSOR ) 25 MG tablet TAKE ONE TABLET BY MOUTH TWICE DAILY 05/28/24   Katrinka Garnette KIDD, MD  NEXLETOL  180 MG TABS TAKE 1 TABLET EVERY DAY 11/07/23   Katrinka Garnette KIDD, MD  nitroGLYCERIN  (NITROSTAT ) 0.4 MG SL tablet Place 1 tablet (0.4 mg total) under the tongue every 5 (five) minutes x 3 doses as needed for chest pain (up to three times). 10/21/23   Francis Ileana SAILOR, PA-C  pilocarpine  (PILOCAR) 1 % ophthalmic solution Place 1 drop into both eyes 2 (two) times daily.     [provider]  Polyethyl Glycol-Propyl Glycol (SYSTANE OP) Place 1-2 drops into both eyes as needed (for dry eyes).     [provider]  polyethylene glycol powder (GLYCOLAX/MIRALAX) 17 GM/SCOOP powder Take 1 Container by mouth daily. Patient taking differently: Take 1 Container by mouth as needed.    [provider]  potassium chloride  (KLOR-CON ) 10  MEQ tablet Take 2 tablets (20 mEq total) by mouth 2 (two) times daily. 07/01/24   Katrinka Garnette KIDD, MD  Probiotic Product (PROBIOTIC DAILY) CAPS Take 1 capsule by mouth daily. Reported on 10/10/2015    [provider]  REPATHA  SURECLICK 140 MG/ML SOAJ INJECT 140MG  UNDER THE SKIN EVERY 2 WEEKS 07/14/24   Okey Vina GAILS, MD  telmisartan  (MICARDIS ) 40 MG tablet Take 1 tablet (40 mg total) by mouth daily. 12/09/23   Okey Vina GAILS, MD  VASCEPA  1 g capsule TAKE 1 CAPSULE TWICE DAILY 02/06/24   Okey Vina GAILS, MD  Wheat Dextrin (BENEFIBER DRINK MIX) PACK Take by mouth.    [provider]    Allergies: Adhesive [tape] and Rosuvastatin     Review of Systems  Eyes:  Positive for blurred vision. Negative for double vision.  Gastrointestinal:  Negative for nausea and vomiting.  Neurological:  Positive for headaches. Negative for weakness and numbness.    Updated Vital Signs BP (!) 152/58 (BP Location: Right Arm)   Pulse 71   Temp (!) 97.5 F (36.4 C)   Resp 18   Ht 5' 3 (1.6 m)   Wt 60.3 kg   SpO2 100%   BMI 23.56 kg/m   Physical Exam Vitals and nursing note reviewed.  Constitutional:      General: She is not in acute distress.    Appearance: Normal appearance. She is well-developed.  HENT:     Head: Normocephalic and atraumatic.  Eyes:     Extraocular Movements: Extraocular movements intact.     Conjunctiva/sclera: Conjunctivae normal.     Pupils: Pupils are equal, round, and reactive to light.  Cardiovascular:     Rate and Rhythm: Normal rate and regular rhythm.     Heart sounds: No murmur heard. Pulmonary:     Effort: Pulmonary effort is normal. No respiratory distress.     Breath sounds: Normal breath sounds. No stridor. No wheezing.  Abdominal:     Palpations: Abdomen is soft.     Tenderness: There is no abdominal tenderness. There is no guarding or rebound.  Musculoskeletal:        General: No deformity.     Cervical back: Neck supple.  Skin:    General: Skin is warm and dry.  Neurological:     General: No focal deficit present.     Mental Status: She is alert.     GCS: GCS eye subscore is 4. GCS verbal subscore is 5. GCS motor subscore is 6.     Cranial Nerves: No cranial nerve deficit.     Sensory: No sensory deficit.     Motor: No weakness.     (all labs ordered are listed, but only abnormal results are displayed) Labs Reviewed  CBC - Abnormal; Notable for the following components:      Result Value   RBC 3.80 (*)    Hemoglobin 11.5 (*)    HCT 35.4 (*)    All other components within normal limits  COMPREHENSIVE  METABOLIC PANEL WITH GFR - Abnormal; Notable for the following components:   BUN 24 (*)    Creatinine, Ser 1.20 (*)    GFR, Estimated 45 (*)    All other components within normal limits  DIFFERENTIAL  ETHANOL  PROTIME-INR  APTT  CBG MONITORING, ED  CBG MONITORING, ED    EKG: EKG Interpretation Date/Time:  Wednesday August 12 2024 21:48:49 EST Ventricular Rate:  72 PR Interval:  176 QRS Duration:  93  QT Interval:  383 QTC Calculation: 420 R Axis:   56  Text Interpretation: Sinus rhythm Low voltage, precordial leads No significant change since prior 2/25 Confirmed by Towana Sharper [phone]) on 08/12/2024 9:49:50 PM  Radiology: No results found.  {Document cardiac monitor, telemetry assessment procedure when appropriate:32947} Procedures   Medications Ordered in the ED  sodium chloride  flush (NS) 0.9 % injection 3 mL (has no administration in time range)    Clinical Course as of 08/12/24 2223  Wed Aug 12, 2024  2220 Patient's intraocular pressure was 18 on the right and 22 on the left.  She says she goes to a Patent attorney in [place]. [MB]    Clinical Course User Index [MB] Towana Sharper BROCKS, MD   {Click here for ABCD2, HEART and other calculators REFRESH Note before signing:1}                              Medical Decision Making Amount and/or Complexity of Data Reviewed Labs: ordered. Radiology: ordered.   This patient complains of ***; this involves an extensive number of treatment Options and is a complaint that carries with it a high risk of complications and morbidity. The differential includes ***  I ordered, reviewed and interpreted labs, which included *** I ordered medication *** and reviewed PMP when indicated. I ordered imaging studies which included *** and I independently    visualized and interpreted imaging which showed *** Additional history obtained from *** Previous records obtained and reviewed *** I consulted *** and discussed lab  and imaging findings and discussed disposition.  Cardiac monitoring reviewed, *** Social determinants considered, *** Critical Interventions: ***  After the interventions stated above, I reevaluated the patient and found *** Admission and further testing considered, ***   {Document critical care time when appropriate  Document review of labs and clinical decision tools ie CHADS2VASC2, etc  Document your independent review of radiology images and any outside records  Document your discussion with family members, caretakers and with consultants  Document social determinants of health affecting pt's care  Document your decision making why or why not admission, treatments were needed:32947:::1}   Final diagnoses:  None    ED Discharge Orders     None

## 2024-08-12 NOTE — Discharge Instructions (Signed)
 You were seen in the emergency department for headache followed by blurry vision in your right eye.  You had lab work and a CAT scan of your head and neck that did not show any obvious explanation.  Please follow-up with your primary care doctor and your eye specialist.  Return if any worsening or concerning symptoms

## 2024-08-12 NOTE — ED Triage Notes (Signed)
 Pt POV ambulatory to triage reporting sudden intense headache and blurred vision in R eye at 2030, headache resolved after 10 minutes, still having blurred vision. Hx glaucoma.

## 2024-08-13 ENCOUNTER — Telehealth: Payer: Self-pay

## 2024-08-13 NOTE — Telephone Encounter (Signed)
 Transition Care Management Follow-up Telephone Call Date of discharge and from where: 08/12/24 [facility] How have you been since you were released from the hospital? [facility] Any questions or concerns? No  Items Reviewed: Did the pt receive and understand the discharge instructions provided? Yes  Medications obtained and verified? Yes  Other? No  Any new allergies since your discharge? No  Dietary orders reviewed? No Do you have support at home? Yes   Home Care and Equipment/Supplies: Were home health services ordered? not applicable If so, what is the name of the agency?   Has the agency set up a time to come to the patient's home? not applicable Were any new equipment or medical supplies ordered?  No What is the name of the medical supply agency?  Were you able to get the supplies/equipment? not applicable Do you have any questions related to the use of the equipment or supplies? No  Functional Questionnaire: (I = Independent and D = Dependent) ADLs: I  Bathing/Dressing- I  Meal Prep- I  Eating- I  Maintaining continence- I  Transferring/Ambulation- I  Managing Meds- I  Follow up appointments reviewed:  PCP Hospital f/u appt confirmed? Yes  Scheduled to see Alexandra Lukes, MD on 08/17/24 @ 11:00am. Specialist Hospital f/u appt confirmed? N/A  Are transportation arrangements needed? No  If their condition worsens, is the pt aware to call PCP or go to the Emergency Dept.? Yes Was the patient provided with contact information for the PCP's office or ED? Yes Was to pt encouraged to call back with questions or concerns? Yes

## 2024-08-17 ENCOUNTER — Ambulatory Visit: Admitting: Family Medicine

## 2024-08-17 ENCOUNTER — Encounter: Payer: Self-pay | Admitting: Family Medicine

## 2024-08-17 ENCOUNTER — Ambulatory Visit: Payer: Medicare PPO

## 2024-08-17 VITALS — BP 112/62 | HR 67 | Temp 97.9°F | Ht 63.0 in | Wt 130.2 lb

## 2024-08-17 DIAGNOSIS — H5461 Unqualified visual loss, right eye, normal vision left eye: Secondary | ICD-10-CM

## 2024-08-17 DIAGNOSIS — F4321 Adjustment disorder with depressed mood: Secondary | ICD-10-CM | POA: Diagnosis not present

## 2024-08-17 DIAGNOSIS — Z0000 Encounter for general adult medical examination without abnormal findings: Secondary | ICD-10-CM

## 2024-08-17 NOTE — Progress Notes (Addendum)
 Chief Complaint  Patient presents with   Medicare Wellness     Subjective:   [name] is a 83 y.o. female who presents for a Medicare Annual Wellness Visit.  Visit info / Clinical Intake: Medicare Wellness Visit Type:: Subsequent Annual Wellness Visit Persons participating in visit and providing information:: patient Medicare Wellness Visit Mode:: In-person (required for WTM) Interpreter Needed?: No Pre-visit prep was completed: yes AWV questionnaire completed by patient prior to visit?: no Living arrangements:: (!) lives alone Patient's Overall Health Status Rating: good Typical amount of pain: none Does pain affect daily life?: no Are you currently prescribed opioids?: no  Dietary Habits and Nutritional Risks How many meals a day?: 3 Eats fruit and vegetables daily?: yes Most meals are obtained by: preparing own meals; eating out In the last 2 weeks, have you had any of the following?: none Diabetic:: no  Functional Status Activities of Daily Living (to include ambulation/medication): Independent Ambulation: Independent with device- listed below Home Assistive Devices/Equipment: Eyeglasses Medication Administration: Independent Home Management (perform basic housework or laundry): Independent Manage your own finances?: yes Primary transportation is: driving Concerns about vision?: no *vision screening is required for WTM* Concerns about hearing?: (!) yes Uses hearing aids?: (!) yes Hear whispered voice?: yes (when have hearing aids in)  Fall Screening Falls in the past year?: 0 Number of falls in past year: 0 Was there an injury with Fall?: 0 Fall Risk Category Calculator: 0 Patient Fall Risk Level: Low Fall Risk  Fall Risk Patient at Risk for Falls Due to: No Fall Risks Fall risk Follow up: Falls evaluation completed  Home and Transportation Safety: All rugs have non-skid backing?: yes All stairs or steps have railings?: yes Grab bars in the  bathtub or shower?: yes Have non-skid surface in bathtub or shower?: yes Good home lighting?: yes Regular seat belt use?: yes Hospital stays in the last year:: no  Cognitive Assessment Difficulty concentrating, remembering, or making decisions? : no Will 6CIT or Mini Cog be Completed: yes What year is it?: 0 points What month is it?: 0 points Give patient an address phrase to remember (5 components): [address] [place]  About what time is it?: 0 points Count backwards from 20 to 1: 0 points Say the months of the year in reverse: 0 points Repeat the address phrase from earlier: 0 points 6 CIT Score: 0 points  Advance Directives (For Healthcare) Does Patient Have a Medical Advance Directive?: Yes Type of Advance Directive: Healthcare Power of Attorney Copy of Healthcare Power of Attorney in Chart?: No - copy requested Would patient like information on creating a medical advance directive?: No - Patient declined  Reviewed/Updated  Reviewed/Updated: Reviewed All (Medical, Surgical, Family, Medications, Allergies, Care Teams, Patient Goals)    Allergies (verified) Adhesive [tape] and Rosuvastatin    Current Medications (verified) Outpatient Encounter Medications as of 08/17/2024  Medication Sig   amLODipine  (NORVASC ) 2.5 MG tablet Take 2.5 mg by mouth daily.   Ascorbic Acid (VITAMIN C PO) Take by mouth. 1 gummy daily   aspirin  EC 81 MG tablet Take 81 mg by mouth daily.   Bimatoprost  (LUMIGAN  OP) Place 1 drop into both eyes at bedtime.    chlorthalidone  (HYGROTON ) 25 MG tablet TAKE ONE TABLET BY MOUTH ONCE DAILY.   Cholecalciferol  (VITAMIN D -3) 5000 UNITS TABS Take 5,000 mg by mouth daily.   dorzolamide -timolol  (COSOPT ) 22.3-6.8 MG/ML ophthalmic solution Place 1 drop into both eyes 2 (two) times daily.    famotidine  (  PEPCID ) 20 MG tablet TAKE 1 TABLET BY MOUTH TWICE DAILY.   levothyroxine  (SYNTHROID ) 50 MCG tablet TAKE ONE TABLET BY MOUTH BEFORE BREAKFAST   metoprolol  tartrate  (LOPRESSOR ) 25 MG tablet TAKE ONE TABLET BY MOUTH TWICE DAILY   NEXLETOL  180 MG TABS TAKE 1 TABLET EVERY DAY   nitroGLYCERIN  (NITROSTAT ) 0.4 MG SL tablet Place 1 tablet (0.4 mg total) under the tongue every 5 (five) minutes x 3 doses as needed for chest pain (up to three times).   pilocarpine  (PILOCAR) 1 % ophthalmic solution Place 1 drop into both eyes 2 (two) times daily.    Polyethyl Glycol-Propyl Glycol (SYSTANE OP) Place 1-2 drops into both eyes as needed (for dry eyes).    potassium chloride  (KLOR-CON ) 10 MEQ tablet Take 2 tablets (20 mEq total) by mouth 2 (two) times daily.   Probiotic Product (PROBIOTIC DAILY) CAPS Take 1 capsule by mouth daily. Reported on 10/10/2015   REPATHA  SURECLICK 140 MG/ML SOAJ INJECT 140MG  UNDER THE SKIN EVERY 2 WEEKS   telmisartan  (MICARDIS ) 40 MG tablet Take 1 tablet (40 mg total) by mouth daily.   VASCEPA  1 g capsule TAKE 1 CAPSULE TWICE DAILY   Wheat Dextrin (BENEFIBER DRINK MIX) PACK Take by mouth.   polyethylene glycol powder (GLYCOLAX/MIRALAX) 17 GM/SCOOP powder Take 1 Container by mouth daily. (Patient not taking: Reported on 08/17/2024)   No facility-administered encounter medications on file as of 08/17/2024.    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 Dr. Leora   CARDIAC CATHETERIZATION     COLONOSCOPY     CORONARY STENT INTERVENTION N/A 06/01/2019   Procedure: CORONARY STENT INTERVENTION;  Surgeon: Wonda Sharper, MD;  Location: [facility] INVASIVE CV LAB;   Service: Cardiovascular;  Laterality: N/A;   CRYOTHERAPY     GYN for CIN III   DILATION AND CURETTAGE OF UTERUS     EXCISION MORTON'S NEUROMA     right foot   EYE SURGERY Right 05/08/2018   LEFT HEART CATH AND CORONARY ANGIOGRAPHY N/A 06/01/2019   Procedure: LEFT HEART CATH AND CORONARY ANGIOGRAPHY;  Surgeon: Wonda Sharper, MD;  [facility] IN[facility];  Service: Cardiovascular;  Laterality: N/A;   MASTECTOMY     REDUCTION MAMMAPLASTY Right    Family History  Problem Relation Age of Onset   Pneumonia Mother    Hyperlipidemia Mother    Parkinsonism Mother    CAD Mother    Other Father        bilateral subdural hematoma   Hyperlipidemia Sister    Colon cancer Paternal Grandmother    Liver disease Neg Hx    Esophageal cancer Neg Hx    Breast cancer Neg Hx    Social History   Occupational History   Occupation: retired  Tobacco Use   Smoking status: Never    Passive exposure: Never   Smokeless tobacco: Never  Vaping Use   Vaping status: Never Used  Substance and  Sexual Activity   Alcohol  use: Yes    Comment: Rare   Drug use: No   Sexual activity: Not Currently    Birth control/protection: Post-menopausal    Comment: less than 5, Ic after 16, no STD, no abnormal pap, no DES   Tobacco Counseling Counseling given: Not Answered  SDOH Screenings   Food Insecurity: No Food Insecurity (08/17/2024)  Housing: Low Risk  (08/17/2024)  Transportation Needs: No Transportation Needs (08/17/2024)  Utilities: Not At Risk (08/17/2024)  Alcohol  Screen: Low Risk  (08/17/2024)  Depression (PHQ2-9): Low Risk  (08/17/2024)  Financial Resource Strain: Low Risk  (08/05/2023)  Physical Activity: Insufficiently Active (08/17/2024)  Social Connections: Moderately Integrated (08/17/2024)  Stress: No Stress Concern Present (08/17/2024)  Tobacco Use: Low Risk  (08/17/2024)  Health Literacy: Adequate Health Literacy (08/17/2024)   See flowsheets for full screening details  Depression  Screen PHQ 2 & 9 Depression Scale- Over the past 2 weeks, how often have you been bothered by any of the following problems? Little interest or pleasure in doing things: 0 Feeling down, depressed, or hopeless (PHQ Adolescent also includes...irritable): 0 PHQ-2 Total Score: 0 Trouble falling or staying asleep, or sleeping too much: 0 Feeling tired or having little energy: 0 Poor appetite or overeating (PHQ Adolescent also includes...weight loss): 0 Feeling bad about yourself - or that you are a failure or have let yourself or your family down: 0 Trouble concentrating on things, such as reading the newspaper or watching television (PHQ Adolescent also includes...like school work): 0 Moving or speaking so slowly that other people could have noticed. Or the opposite - being so fidgety or restless that you have been moving around a lot more than usual: 0 Thoughts that you would be better off dead, or of hurting yourself in some way: 0 PHQ-9 Total Score: 0 If you checked off any problems, how difficult have these problems made it for you to do your work, take care of things at home, or get along with other people?: Not difficult at all     Goals Addressed   None          Objective:    Today's Vitals   08/17/24 0805  BP: 112/62  Pulse: 67  Temp: 97.9 F (36.6 C)  SpO2: 98%  Weight: 130 lb 3.2 oz (59.1 kg)  Height: 5' 3 (1.6 m)   Body mass index is 23.06 kg/m.  Hearing/Vision screen Hearing Screening - Comments:: Has hearing aids Vision Screening - Comments:: Wears rx glasses - up to date with routine eye exams with Dr Nilsa  Immunizations and Health Maintenance Health Maintenance  Topic Date Due   COVID-19 Vaccine (7 - 2025-26 season) 05/11/2024   Colonoscopy  12/03/2024 (Originally 09/23/2023)   Mammogram  06/18/2025   Medicare Annual Wellness (AWV)  08/17/2025   DTaP/Tdap/Td (4 - Td or Tdap) 09/18/2032   Pneumococcal Vaccine: 50+ Years  Completed   Influenza Vaccine   Completed   Bone Density Scan  Completed   Zoster Vaccines- Shingrix  Completed   Meningococcal B Vaccine  Aged Out   Hepatitis C Screening  Discontinued        Assessment/Plan:  This is a routine wellness examination for Almeter.  Patient Care Team: Katrinka Garnette KIDD, MD as PCP - General (Family Medicine) Okey Vina GAILS, MD as PCP - Cardiology (Cardiology) Merrilyn Handler, MD (Inactive) (General Surgery) Leora Lenis, MD (Plastic Surgery) Octavia Charleston, MD as Consulting Physician (Ophthalmology)  I have personally reviewed and noted  the following in the patient's chart:   Medical and social history Use of alcohol , tobacco or illicit drugs  Current medications and supplements including opioid prescriptions. Functional ability and status Nutritional status Physical activity Advanced directives List of other physicians Hospitalizations, surgeries, and ER visits in previous 12 months Vitals Screenings to include cognitive, depression, and falls Referrals and appointments  Orders Placed This Encounter  Procedures   Flu vaccine HIGH DOSE PF(Fluzone Trivalent)   In addition, I have reviewed and discussed with patient certain preventive protocols, quality metrics, and best practice recommendations. A written personalized care plan for preventive services as well as general preventive health recommendations were provided to patient.   Ellouise VEAR Haws, LPN   [id number]   Return in about 1 year (around 08/17/2025).  After Visit Summary: (In Person-Printed) AVS printed and given to the patient  Nurse Notes: Pt stated ER visit for headache and unable to see out of right denies any further instances at this time

## 2024-08-17 NOTE — Progress Notes (Signed)
 Phone [phone] In person visit   Subjective:   [name] is a 83 y.o. year old very pleasant female patient who presents for/with See problem oriented charting Chief Complaint  Patient presents with   Hospitalization Follow-up    Was seen in ED on 08/12/2024; pt feels like she still can't see well out of her right eye;     Past Medical History-  Patient Active Problem List   Diagnosis Date Noted   CAD (coronary artery disease) s/p stent mCx 05/2019 06/02/2019    Priority: High   Grieving 02/18/2017    Priority: High   Osteoporosis 11/05/2016    Priority: High   Anxiety 11/27/2022    Priority: Medium    Aortic atherosclerosis 09/15/2020    Priority: Medium    Chest pain 12/29/2018    Priority: Medium    Primary open-angle glaucoma 11/20/2017    Priority: Medium    Hypothyroidism 07/01/2013    Priority: Medium    Hypertension 11/02/2012    Priority: Medium    Hyperlipidemia 07/24/2011    Priority: Medium    hx: breast cancer, IDC (Microinvasive) w DCIS, 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     Ascorbic Acid (VITAMIN C PO) Take by mouth. 1 gummy daily     aspirin  EC 81 MG tablet Take 81 mg by mouth daily.     Bimatoprost  (LUMIGAN  OP) Place 1 drop into both eyes at bedtime.      chlorthalidone  (HYGROTON ) 25 MG tablet TAKE ONE TABLET BY MOUTH ONCE DAILY. 90 tablet 3   Cholecalciferol  (VITAMIN D -3) 5000 UNITS TABS Take 5,000 mg by mouth daily.     dorzolamide -timolol  (COSOPT ) 22.3-6.8 MG/ML ophthalmic solution  Place 1 drop into both eyes 2 (two) times daily.      famotidine  (PEPCID ) 20 MG tablet TAKE 1 TABLET BY MOUTH TWICE DAILY. 180 tablet 0   levothyroxine  (SYNTHROID ) 50 MCG tablet TAKE ONE TABLET BY MOUTH BEFORE BREAKFAST 90 tablet 3   metoprolol  tartrate (LOPRESSOR ) 25 MG tablet TAKE ONE TABLET BY MOUTH TWICE DAILY 180 tablet 0   NEXLETOL  180 MG TABS TAKE 1 TABLET EVERY DAY 90 tablet 3   nitroGLYCERIN  (NITROSTAT ) 0.4 MG SL tablet Place 1 tablet (0.4 mg total) under the tongue every 5 (five) minutes x 3 doses as needed for chest pain (up to three times). 30 tablet 0   pilocarpine  (PILOCAR) 1 % ophthalmic solution Place 1 drop into both eyes 2 (two) times daily.      Polyethyl Glycol-Propyl Glycol (SYSTANE OP) Place 1-2 drops into both eyes as needed (for dry eyes).      potassium chloride  (KLOR-CON ) 10 MEQ tablet Take 2 tablets (20 mEq total) by mouth 2 (two) times daily. 240 tablet 0   Probiotic Product (PROBIOTIC DAILY) CAPS Take 1 capsule by mouth daily. Reported on 10/10/2015     REPATHA  SURECLICK 140 MG/ML SOAJ INJECT 140MG  UNDER THE SKIN EVERY 2 WEEKS 6 mL 3   telmisartan  (MICARDIS )  40 MG tablet Take 1 tablet (40 mg total) by mouth daily. 90 tablet 3   VASCEPA  1 g capsule TAKE 1 CAPSULE TWICE DAILY 180 capsule 3   Wheat Dextrin (BENEFIBER DRINK MIX) PACK Take by mouth.     polyethylene glycol powder (GLYCOLAX/MIRALAX) 17 GM/SCOOP powder Take 1 Container by mouth daily. (Patient not taking: Reported on 08/17/2024)     No current facility-administered medications for this visit.     Objective:  BP 112/62 (BP Location: Left Arm, Patient Position: Sitting, Cuff Size: Normal)   Pulse 67   Temp 97.9 F (36.6 C) (Temporal)   Ht 5' 3 (1.6 m)   Wt 130 lb 3.2 oz (59.1 kg)   SpO2 98%   BMI 23.06 kg/m  Gen: NAD, resting comfortably CV: RRR no murmurs rubs or gallops Lungs: CTAB no crackles, wheeze, rhonchi Abdomen: soft/nontender/nondistended/normal bowel sounds. No rebound or guarding.  Ext:  no edema Skin: warm, dry  Neuro: CN II-XII intact, sensation and reflexes normal throughout, 5/5 muscle strength in bilateral upper and lower extremities. Normal finger to nose. Normal rapid alternating movements. No pronator drift. Normal romberg. Normal gait.    Vision Screening   Right eye Left eye Both eyes  Without correction 20/20 20/16 20/16   With correction          Assessment and Plan   # Emergency department follow-up for vision change in the right eye S: Patient presented to the emergency department on 08/12/2024 after generalized headache for about 10 minutes and afterwards noting difficulty reading the [place] with her right eye on her mail.  Left eye seem normal.  No blurry vision noted distance.  Does have history of glaucoma.  Eye pressures were not elevated in the emergency department.  Mild dehydration noted with BUN to creatinine ratio-21 and creatinine up slightly to 1.2.  Ethanol level not elevated.  CBC with very mild anemia down 0.5 from most recent check 6 months ago.  PT/INR not elevated and APTT not elevated CT angiography of head and neck with no acute intracranial hemorrhage or ischemic changes.She did report being very active that day putting up decorations  She does not feel she can see well out of her right eye close-up still- better yesterday slightly worse today but not as bad as first day. No issues reading from left eye  A/P: Patient continues to note vision change in the right eye close-up but not at distance-(she has 20/20 vision in the right in 20/16 and the left which is not substantially different.  Recommended ASAP evaluation by ophthalmology.  Excellent start on evaluation by emergency department and we will add an MRI outpatient-we discussed potential stroke workup depending on further evaluation from these 2 steps-we also discussed if vision worsens or other new symptoms should seek care immediately in the emergency department -She certainly has risk  factors for stroke with her CAD history.  We also discussed if we do find stroke potentially working up things like cardiac wiring, echocardiogram, carotid duplex  # Prolonged grieving/depression/anxiety-extremely close to her now deceased spouse and challenging transition since his passing February 2019 -Patient feels like she is having a stressful time around the holidays thinking more about the loss of her husband now nearly 7 years ago-she is open to returning to therapy with Dr. Hollace and I placed a new referral today for.  I also think when there is an acute change like this we need to reconsider medications but we did discuss that medical stairs  can certainly be anxiety provoking such as her vision changes we are try to work through   Recommended follow up: No follow-ups on file. Future Appointments  Date Time Provider Department Center  12/22/2024  1:00 PM Katrinka Garnette KIDD, MD [facility]    Lab/Order associations: No diagnosis found.  No orders of the defined types were placed in this encounter.  I personally spent a total of 35 minutes in the care of the patient today including preparing to see the patient, getting/reviewing separately obtained history, performing a medically appropriate exam/evaluation, counseling and educating particular around the loss of her husband as well as discussing propria workup planned today, and documenting clinical information in the EHR.  Return precautions advised.  Garnette Katrinka, MD

## 2024-08-17 NOTE — Patient Instructions (Addendum)
 Call eye doctor ASAP- important for them to look in back of the eye. Per your primary care doctor and Emergency Department visit- we both want you seen ASAP and I have ordered an MRI to further evaluate as well. We are entering stat referral.   Please call [phone] to schedule a visit with [facility] Dr. Hollace - please tell the office you were directly referred by Dr. Katrinka  We will call you within two weeks about your referral for MRI brain through [facility].  Their phone number is [phone].  Please call them if you have not heard in 1-2 weeks  We may very likely have to do stroke workup  Recommended follow up: Return for as needed for new, worsening, persistent symptoms. If worsening visionseek care in Emergency Department

## 2024-08-17 NOTE — Addendum Note (Signed)
 Addended by: JAYLENE ELLOUISE DEL on: 08/17/2024 11:40 AM   Modules accepted: Orders

## 2024-08-17 NOTE — Patient Instructions (Signed)
 [name],  Thank you for taking the time for your Medicare Wellness Visit. I appreciate your continued commitment to your health goals. Please review the care plan we discussed, and feel free to reach out if I can assist you further.  Please note that Annual Wellness Visits do not include a physical exam. Some assessments may be limited, especially if the visit was conducted virtually. If needed, we may recommend an in-person follow-up with your provider.  Ongoing Care Seeing your primary care provider every 3 to 6 months helps us  monitor your health and provide consistent, personalized care.   Referrals If a referral was made during today's visit and you haven't received any updates within two weeks, please contact the referred provider directly to check on the status.  Recommended Screenings:  Health Maintenance  Topic Date Due   COVID-19 Vaccine (7 - 2025-26 season) 05/11/2024   Colon Cancer Screening  12/03/2024*   Flu Shot  12/08/2024*   Breast Cancer Screening  06/18/2025   Medicare Annual Wellness Visit  08/17/2025   DTaP/Tdap/Td vaccine (4 - Td or Tdap) 09/18/2032   Pneumococcal Vaccine for age over 45  Completed   Osteoporosis screening with Bone Density Scan  Completed   Zoster (Shingles) Vaccine  Completed   Meningitis B Vaccine  Aged Out   Hepatitis C Screening  Discontinued  *Topic was postponed. The date shown is not the original due date.       08/12/2024    9:38 PM  Advanced Directives  Does Patient Have a Medical Advance Directive? Yes  Type of Estate Agent of [place];Living will    Vision: Annual vision screenings are recommended for early detection of glaucoma, cataracts, and diabetic retinopathy. These exams can also reveal signs of chronic conditions such as diabetes and high blood pressure.  Dental: Annual dental screenings help detect early signs of oral cancer, gum disease, and other conditions linked to overall health, including  heart disease and diabetes.  Please see the attached documents for additional preventive care recommendations.

## 2024-08-17 NOTE — Addendum Note (Signed)
 Addended by: KATRINKA GARNETTE KIDD on: 08/17/2024 12:13 PM   Modules accepted: Level of Service

## 2024-08-18 ENCOUNTER — Ambulatory Visit: Admitting: Family Medicine

## 2024-08-18 DIAGNOSIS — H401413 Capsular glaucoma with pseudoexfoliation of lens, right eye, severe stage: Secondary | ICD-10-CM | POA: Diagnosis not present

## 2024-08-18 DIAGNOSIS — H401422 Capsular glaucoma with pseudoexfoliation of lens, left eye, moderate stage: Secondary | ICD-10-CM | POA: Diagnosis not present

## 2024-08-18 DIAGNOSIS — H5319 Other subjective visual disturbances: Secondary | ICD-10-CM | POA: Diagnosis not present

## 2024-08-31 ENCOUNTER — Other Ambulatory Visit: Payer: Self-pay | Admitting: Family Medicine

## 2024-09-04 ENCOUNTER — Other Ambulatory Visit

## 2024-09-07 ENCOUNTER — Encounter (HOSPITAL_COMMUNITY): Payer: Self-pay | Admitting: Diagnostic Radiology

## 2024-09-07 ENCOUNTER — Encounter: Payer: Self-pay | Admitting: Family Medicine

## 2024-09-08 ENCOUNTER — Inpatient Hospital Stay: Admission: RE | Admit: 2024-09-08 | Discharge: 2024-09-08 | Attending: Family Medicine | Admitting: Family Medicine

## 2024-09-08 DIAGNOSIS — H5461 Unqualified visual loss, right eye, normal vision left eye: Secondary | ICD-10-CM

## 2024-09-08 MED ORDER — GADOPICLENOL 0.5 MMOL/ML IV SOLN
6.0000 mL | Freq: Once | INTRAVENOUS | Status: AC | PRN
  Administered 2024-09-08: 6 mL via INTRAVENOUS

## 2024-09-13 ENCOUNTER — Other Ambulatory Visit

## 2024-09-14 ENCOUNTER — Encounter: Payer: Self-pay | Admitting: Family Medicine

## 2024-09-14 ENCOUNTER — Ambulatory Visit: Payer: Self-pay | Admitting: Family Medicine

## 2024-09-14 ENCOUNTER — Ambulatory Visit: Admitting: Family Medicine

## 2024-09-14 VITALS — BP 122/68 | HR 75 | Temp 97.6°F | Ht 63.0 in | Wt 128.0 lb

## 2024-09-14 DIAGNOSIS — F418 Other specified anxiety disorders: Secondary | ICD-10-CM | POA: Diagnosis not present

## 2024-09-14 DIAGNOSIS — E039 Hypothyroidism, unspecified: Secondary | ICD-10-CM | POA: Diagnosis not present

## 2024-09-14 DIAGNOSIS — I10 Essential (primary) hypertension: Secondary | ICD-10-CM

## 2024-09-14 DIAGNOSIS — E559 Vitamin D deficiency, unspecified: Secondary | ICD-10-CM | POA: Diagnosis not present

## 2024-09-14 DIAGNOSIS — D649 Anemia, unspecified: Secondary | ICD-10-CM

## 2024-09-14 LAB — CBC WITH DIFFERENTIAL/PLATELET
Basophils Absolute: 0.1 K/uL (ref 0.0–0.1)
Basophils Relative: 1.8 % (ref 0.0–3.0)
Eosinophils Absolute: 0.1 K/uL (ref 0.0–0.7)
Eosinophils Relative: 1.5 % (ref 0.0–5.0)
HCT: 36.7 % (ref 36.0–46.0)
Hemoglobin: 12 g/dL (ref 12.0–15.0)
Lymphocytes Relative: 21.4 % (ref 12.0–46.0)
Lymphs Abs: 1.6 K/uL (ref 0.7–4.0)
MCHC: 32.7 g/dL (ref 30.0–36.0)
MCV: 91.4 fl (ref 78.0–100.0)
Monocytes Absolute: 0.5 K/uL (ref 0.1–1.0)
Monocytes Relative: 6.3 % (ref 3.0–12.0)
Neutro Abs: 5.2 K/uL (ref 1.4–7.7)
Neutrophils Relative %: 69 % (ref 43.0–77.0)
Platelets: 367 K/uL (ref 150.0–400.0)
RBC: 4.01 Mil/uL (ref 3.87–5.11)
RDW: 13 % (ref 11.5–15.5)
WBC: 7.5 K/uL (ref 4.0–10.5)

## 2024-09-14 LAB — COMPREHENSIVE METABOLIC PANEL WITH GFR
ALT: 12 U/L (ref 3–35)
AST: 17 U/L (ref 5–37)
Albumin: 4.5 g/dL (ref 3.5–5.2)
Alkaline Phosphatase: 40 U/L (ref 39–117)
BUN: 30 mg/dL — ABNORMAL HIGH (ref 6–23)
CO2: 26 meq/L (ref 19–32)
Calcium: 9.4 mg/dL (ref 8.4–10.5)
Chloride: 106 meq/L (ref 96–112)
Creatinine, Ser: 1.32 mg/dL — ABNORMAL HIGH (ref 0.40–1.20)
GFR: 37.44 mL/min — ABNORMAL LOW
Glucose, Bld: 93 mg/dL (ref 70–99)
Potassium: 4.3 meq/L (ref 3.5–5.1)
Sodium: 141 meq/L (ref 135–145)
Total Bilirubin: 0.6 mg/dL (ref 0.2–1.2)
Total Protein: 6.7 g/dL (ref 6.0–8.3)

## 2024-09-14 LAB — IBC + FERRITIN
Ferritin: 154.4 ng/mL (ref 10.0–291.0)
Iron: 105 ug/dL (ref 42–145)
Saturation Ratios: 27.8 % (ref 20.0–50.0)
TIBC: 378 ug/dL (ref 250.0–450.0)
Transferrin: 270 mg/dL (ref 212.0–360.0)

## 2024-09-14 LAB — TSH: TSH: 1.46 u[IU]/mL (ref 0.35–5.50)

## 2024-09-14 LAB — VITAMIN D 25 HYDROXY (VIT D DEFICIENCY, FRACTURES): VITD: 34.37 ng/mL (ref 30.00–100.00)

## 2024-09-14 MED ORDER — ESCITALOPRAM OXALATE 5 MG PO TABS: 5.0000 mg | ORAL_TABLET | Freq: Every day | ORAL | 5 refills | Status: AC

## 2024-09-14 NOTE — Patient Instructions (Addendum)
 Taking the medicine Lexapro /escitalopram  as directed and not missing any doses is one of the best things you can do to treat your depression/anxiety situational after loss of [place].  Here are some things to keep in mind:  Side effects (stomach upset, some increased anxiety) may happen before you notice a benefit.  These side effects typically go away over time. Changes to your dose of medicine or a change in medication all together is sometimes necessary Most people need to be on medication at least 6-12 months Many people will notice an improvement within two weeks but the full effect of the medication can take up to 6 weeks Stopping the medication when you start feeling better often results in a return of symptoms If you start having thoughts of hurting yourself or others after starting this medicine, call our office immediately at [phone] or seek care through 911.    Please stop by lab before you go If you have mychart- we will send your results within 3 business days of us  receiving them.  If you do not have mychart- we will call you about results within 5 business days of us  receiving them.  *please also note that you will see labs on mychart as soon as they post. I will later go in and write notes on them- will say notes from Dr. Katrinka   Recommended follow up: Return in about 6 weeks (around 10/26/2024) for followup or sooner if needed.Schedule b4 you leave.

## 2024-09-14 NOTE — Progress Notes (Signed)
 " Phone [phone] In person visit   Subjective:   [name] is a 84 y.o. year old very pleasant female patient who presents for/with See problem oriented charting Chief Complaint  Patient presents with   Medical Management of Chronic Issues    Past Medical History-  Patient Active Problem List   Diagnosis Date Noted   CAD (coronary artery disease) s/p stent mCx 05/2019 06/02/2019    Priority: High   Grieving 02/18/2017    Priority: High   Osteoporosis 11/05/2016    Priority: High   Anxiety 11/27/2022    Priority: Medium    Aortic atherosclerosis 09/15/2020    Priority: Medium    Chest pain 12/29/2018    Priority: Medium    Primary open-angle glaucoma 11/20/2017    Priority: Medium    Hypothyroidism 07/01/2013    Priority: Medium    Hypertension 11/02/2012    Priority: Medium    Hyperlipidemia 07/24/2011    Priority: Medium    hx: breast cancer, IDC (Microinvasive) w DCIS, 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     Ascorbic Acid (VITAMIN C PO) Take by mouth. 1 gummy daily     aspirin  EC 81 MG tablet Take 81 mg by mouth daily.     Bimatoprost  (LUMIGAN  OP) Place 1 drop into both eyes at bedtime.      chlorthalidone  (HYGROTON ) 25 MG tablet TAKE ONE TABLET BY MOUTH ONCE DAILY. 90 tablet 3   Cholecalciferol  (VITAMIN D -3) 5000 UNITS TABS Take 5,000 mg by mouth daily.     dorzolamide -timolol  (COSOPT ) 22.3-6.8 MG/ML ophthalmic solution Place 1 drop into both eyes 2 (two) times daily.      escitalopram  (LEXAPRO ) 5 MG  tablet Take 1 tablet (5 mg total) by mouth daily. 30 tablet 5   famotidine  (PEPCID ) 20 MG tablet TAKE 1 TABLET BY MOUTH TWICE DAILY. 180 tablet 0   levothyroxine  (SYNTHROID ) 50 MCG tablet TAKE ONE TABLET BY MOUTH BEFORE BREAKFAST 90 tablet 3   metoprolol  tartrate (LOPRESSOR ) 25 MG tablet TAKE ONE TABLET BY MOUTH TWICE DAILY 180 tablet 0   NEXLETOL  180 MG TABS TAKE 1 TABLET EVERY DAY 90 tablet 3   nitroGLYCERIN  (NITROSTAT ) 0.4 MG SL tablet Place 1 tablet (0.4 mg total) under the tongue every 5 (five) minutes x 3 doses as needed for chest pain (up to three times). 30 tablet 0   pilocarpine  (PILOCAR) 1 % ophthalmic solution Place 1 drop into both eyes 2 (two) times daily.      Polyethyl Glycol-Propyl Glycol (SYSTANE OP) Place 1-2 drops into both eyes as needed (for dry eyes).      potassium chloride  (KLOR-CON ) 10 MEQ tablet Take 2 tablets (20 mEq total) by mouth 2 (two) times daily. 240 tablet 0   Probiotic Product (PROBIOTIC DAILY) CAPS Take 1 capsule by mouth daily. Reported on 10/10/2015     REPATHA  SURECLICK 140 MG/ML SOAJ INJECT 140MG  UNDER THE SKIN EVERY 2 WEEKS 6 mL 3   telmisartan  (MICARDIS )  40 MG tablet Take 1 tablet (40 mg total) by mouth daily. 90 tablet 3   VASCEPA  1 g capsule TAKE 1 CAPSULE TWICE DAILY 180 capsule 3   Wheat Dextrin (BENEFIBER DRINK MIX) PACK Take by mouth.     polyethylene glycol powder (GLYCOLAX/MIRALAX) 17 GM/SCOOP powder Take 1 Container by mouth daily. (Patient not taking: Reported on 09/14/2024)     No current facility-administered medications for this visit.     Objective:  BP 122/68 (BP Location: Left Arm, Patient Position: Sitting, Cuff Size: Normal)   Pulse 75   Temp 97.6 F (36.4 C) (Temporal)   Ht 5' 3 (1.6 m)   Wt 128 lb (58.1 kg)   SpO2 97%   BMI 22.67 kg/m  Gen: NAD, resting comfortably CV: RRR no murmurs rubs or gallops Lungs: CTAB no crackles, wheeze, rhonchi Ext: no edema Skin: warm, dry     Assessment and Plan    # Vision change in  right eye S: Please see note 08/17/2024 but patient had emergency department visit 08/12/2024 after generalized headache for about 10 minutes after which she noted difficulty reading close up with her right eye.  She was mildly dehydrated but other workup was reassuring including CT angiography of head and neck.  She has been very active that day  At last visit we strongly encouraged close follow-up with ophthalmology ASAP.  We added MRI but those results are still pending.  We discussed if worsening to seek care in emergency department as she does have a fair number of risk factors.  She saw ophthalmology 08/18/2024 with [facility]-they did not have concerns about her eye exam and noted issues were primarily with fine print without glasses-she was reassured after her pressures were reassuring  Vision changes have cleared in the right eye A/P: no ongoing issues and pending MRI but we discussed this will likely be reassuring once we receive those results.   # Prolonged grieving/depression/anxiety-extremely close to her now deceased spouse and challenging transition since his passing February 2019 -Holiday season was very stressful for her and we referred her back to Dr. Stanford had a bad billing experience. I thanked her for scheduling -She received paperwork in the mail and submitted and has appointment next month!  -we also discussed potentially using medicine-she's is agreeable and we opted to try 5 mg Lexapro . . PHQ9 of 3 and GAD7 of 4- but I think these may be underreported -she's being intentional about getting out  #hypertension S: medication: Metoprolol  25 mg twice daily, amlodipine  2.5 mg (with 2nd dose if needed for high BP over 150 given variability), telmisartan  40 mg per Dr. Okey, chlorthalidone  25 mg BP Readings from Last 3 Encounters:  09/14/24 122/68  08/17/24 112/62  08/17/24 112/62  A/P: blood pressure well controlled continue current medications . Lexapro  could have mild  effect  #hypothyroidism S: compliant On thyroid  medication-levothyroxine  50 mcg  Lab Results  Component Value Date   TSH 1.63 02/12/2024   A/P:with depression/anxiety- check TSH to make sure ok- continue current medications    #Vitamin D  deficiency S: Medication: 5000 units daily Last vitamin D  Lab Results  Component Value Date   VD25OH 43.77 08/01/2023  A/P: hopefully stable- update vitamin d  today. Continue current meds for now    #very mild anemia on most recent labs- check again to make sure not trending down and check iron stores as well  Recommended follow up: Return in about 6 weeks (around 10/26/2024) for followup or sooner if needed.Schedule  b4 you leave. Future Appointments  Date Time Provider Department Center  10/21/2024  1:00 PM Hollace Richerd [place], [place] [facility] None  12/22/2024  1:00 PM Katrinka Garnette [place], [place] [facility]    Lab/Order associations:   ICD-10-CM   1. Situational anxiety  F41.8     2. Vitamin D  deficiency  E55.9 Vitamin D  (25 hydroxy)    3. Primary hypertension  I10 Comprehensive metabolic panel with GFR    CBC with Differential/Platelet    4. Hypothyroidism, unspecified type  E03.9 TSH    5. Anemia, unspecified type  D64.9 IBC + Ferritin      Meds ordered this encounter  Medications   escitalopram  (LEXAPRO ) 5 MG tablet    Sig: Take 1 tablet (5 mg total) by mouth daily.    Dispense:  30 tablet    Refill:  5    Return precautions advised.  Garnette Katrinka, MD  "

## 2024-09-15 ENCOUNTER — Other Ambulatory Visit: Payer: Self-pay | Admitting: Family Medicine

## 2024-09-21 ENCOUNTER — Ambulatory Visit: Payer: Self-pay | Admitting: Family Medicine

## 2024-09-30 ENCOUNTER — Other Ambulatory Visit

## 2024-10-21 ENCOUNTER — Ambulatory Visit: Admitting: Behavioral Health

## 2024-10-26 ENCOUNTER — Ambulatory Visit: Admitting: Family Medicine

## 2024-12-22 ENCOUNTER — Encounter: Admitting: Family Medicine
